# Patient Record
Sex: Female | Born: 1945 | Race: White | Hispanic: No | Smoking: Current every day smoker
Health system: Southern US, Community
[De-identification: ages and names within clinical notes are randomized; demographics above are authoritative.]

## PROBLEM LIST (undated history)

## (undated) DIAGNOSIS — M1990 Unspecified osteoarthritis, unspecified site: Secondary | ICD-10-CM

## (undated) DIAGNOSIS — M8580 Other specified disorders of bone density and structure, unspecified site: Secondary | ICD-10-CM

## (undated) DIAGNOSIS — B370 Candidal stomatitis: Secondary | ICD-10-CM

## (undated) DIAGNOSIS — F411 Generalized anxiety disorder: Secondary | ICD-10-CM

## (undated) DIAGNOSIS — D126 Benign neoplasm of colon, unspecified: Secondary | ICD-10-CM

## (undated) DIAGNOSIS — I10 Essential (primary) hypertension: Secondary | ICD-10-CM

## (undated) DIAGNOSIS — I4891 Unspecified atrial fibrillation: Secondary | ICD-10-CM

## (undated) DIAGNOSIS — K56609 Unspecified intestinal obstruction, unspecified as to partial versus complete obstruction: Secondary | ICD-10-CM

## (undated) DIAGNOSIS — J42 Unspecified chronic bronchitis: Secondary | ICD-10-CM

## (undated) DIAGNOSIS — I4892 Unspecified atrial flutter: Secondary | ICD-10-CM

## (undated) DIAGNOSIS — K760 Fatty (change of) liver, not elsewhere classified: Secondary | ICD-10-CM

## (undated) DIAGNOSIS — J309 Allergic rhinitis, unspecified: Secondary | ICD-10-CM

## (undated) DIAGNOSIS — K5790 Diverticulosis of intestine, part unspecified, without perforation or abscess without bleeding: Secondary | ICD-10-CM

## (undated) DIAGNOSIS — J939 Pneumothorax, unspecified: Secondary | ICD-10-CM

## (undated) DIAGNOSIS — Z720 Tobacco use: Secondary | ICD-10-CM

## (undated) HISTORY — DX: Unspecified atrial fibrillation: I48.91

## (undated) HISTORY — DX: Diverticulosis of intestine, part unspecified, without perforation or abscess without bleeding: K57.90

## (undated) HISTORY — DX: Unspecified osteoarthritis, unspecified site: M19.90

## (undated) HISTORY — DX: Other specified disorders of bone density and structure, unspecified site: M85.80

## (undated) HISTORY — DX: Benign neoplasm of colon, unspecified: D12.6

## (undated) HISTORY — DX: Allergic rhinitis, unspecified: J30.9

## (undated) HISTORY — DX: Candidal stomatitis: B37.0

## (undated) HISTORY — DX: Tobacco use: Z72.0

## (undated) HISTORY — DX: Essential (primary) hypertension: I10

## (undated) HISTORY — DX: Unspecified atrial flutter: I48.92

## (undated) HISTORY — DX: Unspecified intestinal obstruction, unspecified as to partial versus complete obstruction: K56.609

## (undated) HISTORY — DX: Generalized anxiety disorder: F41.1

## (undated) HISTORY — DX: Fatty (change of) liver, not elsewhere classified: K76.0

## (undated) HISTORY — PX: ABDOMINAL HYSTERECTOMY: SHX81

## (undated) HISTORY — DX: Pneumothorax, unspecified: J93.9

---

## 1965-02-01 HISTORY — PX: LUNG REMOVAL, PARTIAL: SHX233

## 1976-03-04 HISTORY — PX: APPENDECTOMY: SHX54

## 1982-03-04 HISTORY — PX: TOTAL ABDOMINAL HYSTERECTOMY W/ BILATERAL SALPINGOOPHORECTOMY: SHX83

## 1994-03-04 DIAGNOSIS — J939 Pneumothorax, unspecified: Secondary | ICD-10-CM

## 1994-03-04 HISTORY — DX: Pneumothorax, unspecified: J93.9

## 1998-02-21 ENCOUNTER — Other Ambulatory Visit: Admission: RE | Admit: 1998-02-21 | Discharge: 1998-02-21 | Payer: Self-pay | Admitting: Obstetrics and Gynecology

## 1999-02-22 ENCOUNTER — Other Ambulatory Visit: Admission: RE | Admit: 1999-02-22 | Discharge: 1999-02-22 | Payer: Self-pay | Admitting: Obstetrics and Gynecology

## 2000-03-21 ENCOUNTER — Other Ambulatory Visit: Admission: RE | Admit: 2000-03-21 | Discharge: 2000-03-21 | Payer: Self-pay | Admitting: Obstetrics and Gynecology

## 2001-03-24 ENCOUNTER — Other Ambulatory Visit: Admission: RE | Admit: 2001-03-24 | Discharge: 2001-03-24 | Payer: Self-pay | Admitting: Obstetrics and Gynecology

## 2001-05-10 ENCOUNTER — Emergency Department (HOSPITAL_COMMUNITY): Admission: EM | Admit: 2001-05-10 | Discharge: 2001-05-10 | Payer: Self-pay | Admitting: Emergency Medicine

## 2001-05-10 ENCOUNTER — Encounter: Payer: Self-pay | Admitting: Emergency Medicine

## 2001-10-01 ENCOUNTER — Encounter: Payer: Self-pay | Admitting: Internal Medicine

## 2001-10-01 ENCOUNTER — Ambulatory Visit (HOSPITAL_COMMUNITY): Admission: RE | Admit: 2001-10-01 | Discharge: 2001-10-01 | Payer: Self-pay | Admitting: Internal Medicine

## 2002-03-25 ENCOUNTER — Other Ambulatory Visit: Admission: RE | Admit: 2002-03-25 | Discharge: 2002-03-25 | Payer: Self-pay | Admitting: Obstetrics and Gynecology

## 2003-03-30 ENCOUNTER — Other Ambulatory Visit: Admission: RE | Admit: 2003-03-30 | Discharge: 2003-03-30 | Payer: Self-pay | Admitting: Obstetrics and Gynecology

## 2003-05-12 ENCOUNTER — Ambulatory Visit (HOSPITAL_COMMUNITY): Admission: RE | Admit: 2003-05-12 | Discharge: 2003-05-12 | Payer: Self-pay | Admitting: Gastroenterology

## 2004-03-29 ENCOUNTER — Other Ambulatory Visit: Admission: RE | Admit: 2004-03-29 | Discharge: 2004-03-29 | Payer: Self-pay | Admitting: Obstetrics and Gynecology

## 2004-10-18 ENCOUNTER — Emergency Department (HOSPITAL_COMMUNITY): Admission: EM | Admit: 2004-10-18 | Discharge: 2004-10-18 | Payer: Self-pay | Admitting: *Deleted

## 2004-10-18 ENCOUNTER — Encounter: Admission: RE | Admit: 2004-10-18 | Discharge: 2004-10-18 | Payer: Self-pay | Admitting: Internal Medicine

## 2004-10-19 ENCOUNTER — Inpatient Hospital Stay (HOSPITAL_COMMUNITY): Admission: EM | Admit: 2004-10-19 | Discharge: 2004-10-30 | Payer: Self-pay | Admitting: Emergency Medicine

## 2004-10-22 ENCOUNTER — Encounter: Payer: Self-pay | Admitting: Cardiology

## 2004-10-23 ENCOUNTER — Ambulatory Visit: Payer: Self-pay | Admitting: Cardiology

## 2004-10-31 ENCOUNTER — Ambulatory Visit: Payer: Self-pay | Admitting: *Deleted

## 2004-10-31 ENCOUNTER — Ambulatory Visit: Payer: Self-pay

## 2004-11-06 ENCOUNTER — Ambulatory Visit: Payer: Self-pay | Admitting: Critical Care Medicine

## 2004-11-07 ENCOUNTER — Ambulatory Visit: Payer: Self-pay | Admitting: Critical Care Medicine

## 2004-11-08 ENCOUNTER — Ambulatory Visit: Payer: Self-pay | Admitting: Internal Medicine

## 2004-11-09 ENCOUNTER — Ambulatory Visit: Payer: Self-pay | Admitting: Cardiology

## 2004-11-14 ENCOUNTER — Ambulatory Visit: Payer: Self-pay | Admitting: Cardiology

## 2004-11-16 ENCOUNTER — Ambulatory Visit: Payer: Self-pay | Admitting: Cardiology

## 2004-11-21 ENCOUNTER — Ambulatory Visit: Payer: Self-pay | Admitting: Cardiology

## 2004-12-04 ENCOUNTER — Ambulatory Visit: Payer: Self-pay | Admitting: *Deleted

## 2004-12-10 ENCOUNTER — Ambulatory Visit: Payer: Self-pay | Admitting: Critical Care Medicine

## 2004-12-19 ENCOUNTER — Ambulatory Visit: Payer: Self-pay | Admitting: Cardiology

## 2005-01-09 ENCOUNTER — Ambulatory Visit: Payer: Self-pay | Admitting: Cardiology

## 2005-01-14 ENCOUNTER — Ambulatory Visit: Payer: Self-pay | Admitting: Internal Medicine

## 2005-01-23 ENCOUNTER — Ambulatory Visit: Payer: Self-pay | Admitting: Cardiology

## 2005-02-06 ENCOUNTER — Ambulatory Visit: Payer: Self-pay | Admitting: *Deleted

## 2005-02-11 ENCOUNTER — Ambulatory Visit: Payer: Self-pay | Admitting: Cardiology

## 2005-02-20 ENCOUNTER — Ambulatory Visit: Payer: Self-pay | Admitting: Internal Medicine

## 2005-03-20 ENCOUNTER — Encounter: Admission: RE | Admit: 2005-03-20 | Discharge: 2005-03-20 | Payer: Self-pay | Admitting: Internal Medicine

## 2005-04-01 ENCOUNTER — Other Ambulatory Visit: Admission: RE | Admit: 2005-04-01 | Discharge: 2005-04-01 | Payer: Self-pay | Admitting: Obstetrics and Gynecology

## 2005-04-15 ENCOUNTER — Ambulatory Visit: Payer: Self-pay | Admitting: Pulmonary Disease

## 2005-04-25 ENCOUNTER — Ambulatory Visit: Payer: Self-pay | Admitting: Pulmonary Disease

## 2005-05-02 ENCOUNTER — Ambulatory Visit: Payer: Self-pay | Admitting: Critical Care Medicine

## 2005-07-17 ENCOUNTER — Ambulatory Visit: Payer: Self-pay | Admitting: Internal Medicine

## 2005-08-08 ENCOUNTER — Ambulatory Visit: Payer: Self-pay | Admitting: Cardiology

## 2005-08-22 ENCOUNTER — Ambulatory Visit: Payer: Self-pay | Admitting: Critical Care Medicine

## 2005-12-30 ENCOUNTER — Ambulatory Visit: Payer: Self-pay | Admitting: Critical Care Medicine

## 2006-04-09 ENCOUNTER — Other Ambulatory Visit: Admission: RE | Admit: 2006-04-09 | Discharge: 2006-04-09 | Payer: Self-pay | Admitting: Obstetrics and Gynecology

## 2006-04-16 ENCOUNTER — Ambulatory Visit: Payer: Self-pay | Admitting: Critical Care Medicine

## 2007-01-15 DIAGNOSIS — J4540 Moderate persistent asthma, uncomplicated: Secondary | ICD-10-CM | POA: Insufficient documentation

## 2007-02-04 ENCOUNTER — Encounter: Payer: Self-pay | Admitting: Critical Care Medicine

## 2007-05-14 ENCOUNTER — Encounter: Payer: Self-pay | Admitting: Critical Care Medicine

## 2007-06-09 ENCOUNTER — Other Ambulatory Visit: Admission: RE | Admit: 2007-06-09 | Discharge: 2007-06-09 | Payer: Self-pay | Admitting: Obstetrics and Gynecology

## 2008-03-02 ENCOUNTER — Inpatient Hospital Stay (HOSPITAL_COMMUNITY): Admission: EM | Admit: 2008-03-02 | Discharge: 2008-03-04 | Payer: Self-pay | Admitting: Emergency Medicine

## 2008-03-02 ENCOUNTER — Ambulatory Visit: Payer: Self-pay | Admitting: Pulmonary Disease

## 2008-03-03 ENCOUNTER — Encounter: Payer: Self-pay | Admitting: Critical Care Medicine

## 2008-03-10 ENCOUNTER — Ambulatory Visit: Payer: Self-pay | Admitting: Critical Care Medicine

## 2008-04-06 ENCOUNTER — Ambulatory Visit: Payer: Self-pay | Admitting: Critical Care Medicine

## 2008-05-03 ENCOUNTER — Telehealth (INDEPENDENT_AMBULATORY_CARE_PROVIDER_SITE_OTHER): Payer: Self-pay | Admitting: *Deleted

## 2008-06-09 ENCOUNTER — Ambulatory Visit: Payer: Self-pay | Admitting: Obstetrics and Gynecology

## 2008-06-09 ENCOUNTER — Other Ambulatory Visit: Admission: RE | Admit: 2008-06-09 | Discharge: 2008-06-09 | Payer: Self-pay | Admitting: Obstetrics and Gynecology

## 2008-06-09 ENCOUNTER — Encounter: Payer: Self-pay | Admitting: Obstetrics and Gynecology

## 2008-08-08 ENCOUNTER — Ambulatory Visit: Payer: Self-pay | Admitting: Cardiology

## 2008-08-08 ENCOUNTER — Encounter: Payer: Self-pay | Admitting: Nurse Practitioner

## 2008-08-08 DIAGNOSIS — J309 Allergic rhinitis, unspecified: Secondary | ICD-10-CM | POA: Insufficient documentation

## 2008-08-08 DIAGNOSIS — F411 Generalized anxiety disorder: Secondary | ICD-10-CM | POA: Insufficient documentation

## 2008-08-09 LAB — CONVERTED CEMR LAB
BUN: 15 mg/dL (ref 6–23)
CO2: 27 meq/L (ref 19–32)
Calcium: 9.1 mg/dL (ref 8.4–10.5)
Chloride: 112 meq/L (ref 96–112)
Creatinine, Ser: 0.7 mg/dL (ref 0.4–1.2)
GFR calc non Af Amer: 89.3 mL/min (ref >60)
Glucose, Bld: 82 mg/dL (ref 70–99)
Magnesium: 2.2 mg/dL (ref 1.5–2.5)
Potassium: 3.6 meq/L (ref 3.5–5.1)
Sodium: 143 meq/L (ref 135–145)
TSH: 1.21 microintl units/mL (ref 0.35–5.50)

## 2008-08-10 ENCOUNTER — Ambulatory Visit: Payer: Self-pay | Admitting: Cardiology

## 2008-08-15 ENCOUNTER — Encounter: Payer: Self-pay | Admitting: Cardiology

## 2008-08-15 ENCOUNTER — Ambulatory Visit: Payer: Self-pay

## 2008-09-06 ENCOUNTER — Encounter (INDEPENDENT_AMBULATORY_CARE_PROVIDER_SITE_OTHER): Payer: Self-pay | Admitting: *Deleted

## 2008-10-06 ENCOUNTER — Telehealth: Payer: Self-pay | Admitting: Cardiology

## 2008-10-18 ENCOUNTER — Ambulatory Visit: Payer: Self-pay | Admitting: Cardiology

## 2008-11-26 ENCOUNTER — Encounter: Payer: Self-pay | Admitting: Critical Care Medicine

## 2008-11-28 ENCOUNTER — Encounter: Payer: Self-pay | Admitting: Critical Care Medicine

## 2009-02-08 ENCOUNTER — Encounter (INDEPENDENT_AMBULATORY_CARE_PROVIDER_SITE_OTHER): Payer: Self-pay | Admitting: *Deleted

## 2009-04-13 ENCOUNTER — Ambulatory Visit: Payer: Self-pay | Admitting: Cardiology

## 2009-04-26 ENCOUNTER — Telehealth: Payer: Self-pay | Admitting: Critical Care Medicine

## 2009-05-02 ENCOUNTER — Ambulatory Visit: Payer: Self-pay | Admitting: Critical Care Medicine

## 2009-06-12 ENCOUNTER — Ambulatory Visit: Payer: Self-pay | Admitting: Obstetrics and Gynecology

## 2009-06-12 ENCOUNTER — Other Ambulatory Visit: Admission: RE | Admit: 2009-06-12 | Discharge: 2009-06-12 | Payer: Self-pay | Admitting: Obstetrics and Gynecology

## 2009-06-29 ENCOUNTER — Ambulatory Visit: Payer: Self-pay | Admitting: Obstetrics and Gynecology

## 2009-08-09 ENCOUNTER — Telehealth: Payer: Self-pay | Admitting: Critical Care Medicine

## 2009-10-06 ENCOUNTER — Ambulatory Visit: Payer: Self-pay | Admitting: Critical Care Medicine

## 2009-11-27 ENCOUNTER — Ambulatory Visit: Payer: Self-pay | Admitting: Critical Care Medicine

## 2009-12-25 ENCOUNTER — Ambulatory Visit: Payer: Self-pay | Admitting: Critical Care Medicine

## 2010-02-14 ENCOUNTER — Ambulatory Visit: Payer: Self-pay | Admitting: Critical Care Medicine

## 2010-02-16 ENCOUNTER — Telehealth: Payer: Self-pay | Admitting: Critical Care Medicine

## 2010-02-19 ENCOUNTER — Telehealth (INDEPENDENT_AMBULATORY_CARE_PROVIDER_SITE_OTHER): Payer: Self-pay | Admitting: *Deleted

## 2010-04-03 NOTE — Letter (Signed)
Summary: Appointment - Reminder 2  [employer], Main Office  [address]   [place], [place] [address]   Phone: [phone]  Fax: [phone]     February 08, 2009 MRN: [id number]   Anders Grant [address] [place], [place]  [address]   Dear Ms. GARRISON,  Our records indicate that it is time to schedule a follow-up appointment. Dr.WALL recommended that you follow up with [place] in FEB,2011. It is very important that we reach you to schedule this appointment. We look forward to participating in your health care needs. Please contact us at the number listed above at your earliest convenience to schedule your appointment.  If you are unable to make an appointment at this time, give [place] a call so we can update our records.     Sincerely, [facility] Scheduling Team

## 2010-04-03 NOTE — Assessment & Plan Note (Signed)
Summary: ROV/KFW/ EVENT STRIPS IN BLUE FOLDER/ANAS   Visit Type:  rov Primary Provider:  Dr. Elmore Guise  CC:  .pt c/o bilateral leg pain says then she notices that feet/ankles have edema.  History of Present Illness: Elizabeth Mcconnell returns today for management of her paroxysmal atrial fibrillation. Please see the extensive note by our nurse practitioner Thayer Ohm.  Her TSH, magnesium potassium were normal. An event recorder showed several episodes of atrial fibrillation with rapid ventricular rate. Last date recorded was 08/20/2008.  Since increasing her diltiazem or Cardizem to 300 mg, she is remarkably improved. She is also now off of her prednisone and antibiotics for asthma and COPD flares. She remains on aspirin 325 mg per day.  Her echocardiogram showed normal left ventricular function, no LVH, and no significant left atrial enlargement.  Current Medications (verified): 1)  Advair Diskus 250-50 Mcg/dose Misc (Fluticasone-Salmeterol) .... Use 1 Puff Two Times A Day 2)  Aspirin Ec 325 Mg Tbec (Aspirin) .... Take One Tablet By Mouth Daily 3)  Cardizem Cd 300 Mg Xr24h-Cap (Diltiazem Hcl Coated Beads) .... Take One Daily 4)  Singulair 10 Mg Tabs (Montelukast Sodium) .... Take 1 Tablet By Mouth Once A Day 5)  Xopenex Hfa 45 Mcg/act Aero (Levalbuterol Tartrate) .... Inhale 1-2 Puff Using Inhaler Every Four Hours As Needed 6)  Estradiol 1 Mg Tabs (Estradiol) .[name].. 1 By Mouth Once Daily 7)  Nasonex 50 Mcg/act Susp (Mometasone Furoate) .[name].. 1 By Mouth Once Daily 8)  Calcium-Vitamin D 500-200 Mg-Unit Tabs (Calcium Carbonate-Vitamin D) .[name].. 1 Tab Once Daily  Allergies (verified): 1)  ! 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    Past Surgical History: Last updated: 08/26/2008 lung surgery 02/1965 Hysterectomy C-section x2  Family History: Last updated: 03/10/2008 no cancer, DM or heart dz.  asthma hx.   Social History: Last updated: 03/10/2008 former smoker Widow- husband passed 12/09 2 sons.  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   Risk Factors: Smoking Status: quit (01/15/2007)  Review of Systems       negative other than the history of present illness  Vital Signs:  Patient profile:   65 year old female Height:      65 inches Weight:      158 pounds BMI:     26.39 Pulse rate:   76 / minute Pulse rhythm:   regular BP sitting:   138 / 70  (left arm) Cuff size:   large  Vitals Entered By: Danielle Rankin, CMA (October 18, 2008 3:11 PM)  Physical Exam  General:  Well developed, well nourished, in no acute distress. Head:  normocephalic and atraumatic Neck:  Neck supple, no JVD. No masses, thyromegaly or abnormal cervical nodes. Lungs:  Clear bilaterally to auscultation and percussion. Heart:  Non-displaced PMI, chest non-tender; regular rate and rhythm, S1, S2 without murmurs, rubs or gallops. Carotid upstroke normal, no bruit. Normal abdominal aortic size, no bruits. Femorals normal pulses, no bruits. Pedals normal pulses. No edema, no varicosities. Msk:  Back normal, normal gait. Muscle strength and tone normal. Pulses:  pulses normal in all 4 extremities Extremities:  trace left pedal edema and trace right pedal edema.   Neurologic:  Alert and oriented x 3. Skin:  Intact without lesions or rashes. Psych:  Normal affect.   Impression & Recommendations:  Problem #  1:  ATRIAL FIBRILLATION (ICD-427.31) Assessment Improved  Her updated medication list for this problem includes:    Aspirin Ec 325 Mg Tbec (Aspirin) .[name]... Take one tablet by mouth daily Continue diltiazem at the current dose.

## 2010-04-03 NOTE — Assessment & Plan Note (Signed)
Summary: Pulmonary OV   Primary Provider/Referring Provider:  Dr. Elmore Guise  CC:  6 month follow up.  pt states breathing is doing well overall.  Denies SOB, wheezing, chest tightness, cough.  Requesting singular rx, and xopenex rx and generic sub for nasonex-[facility].Elizabeth Mcconnell  History of Present Illness:  65 year old white female with a history of moderate persistent asthma with significant atopic features, very high positive IgE level.   May 02, 2009 2:27 PM Annual f/u.  No major problems over the past year  Pt denies any significant sore throat, nasal congestion or excess secretions, fever, chills, sweats, unintended weight loss, pleurtic or exertional chest pain, orthopnea PND, or leg swelling Pt denies any increase in rescue therapy over baseline, denies waking up needing it or having any early am or nocturnal exacerbations of coughing/wheezing/or dyspnea.  October 06, 2009 9:58 AM no real change since march.  rx amoxicillin after zpak per pcp green in 6/11.  Still febrile after zpak and better after amoxicillin.  Had sinus and throat issues and prod cough.  Now is better.   Felt bad all over  for several days.  Ok since.   Now no cough,  no wheeze  Asthma History    Asthma Control Assessment:    Age range: 12+ years    Symptoms: 0-2 days/week    Nighttime Awakenings: 0-2/month    Interferes w/ normal activity: no limitations    SABA use (not for EIB): 0-2 days/week    ATAQ questionnaire: 0    Exacerbations requiring oral systemic steroids: 0-1/year    Asthma Control Assessment: Well Controlled   Preventive Screening-Counseling & Management  Alcohol-Tobacco     Smoking Status: quit > 6 months     Year Quit: 2006     Pack years: 2 packs per week for 25 years  Current Medications (verified): 1)  Advair Diskus 250-50 Mcg/dose Misc (Fluticasone-Salmeterol) .... Use 1 Puff Two Times A Day 2)  Aspirin 81 Mg Tbec (Aspirin) .... Take One Tablet By Mouth Daily 3)  Cardizem Cd  300 Mg Xr24h-Cap (Diltiazem Hcl Coated Beads) .... Take One Daily 4)  Singulair 10 Mg Tabs (Montelukast Sodium) .... Take 1 Tablet By Mouth Once A Day 5)  Xopenex Hfa 45 Mcg/act Aero (Levalbuterol Tartrate) .... Inhale 1-2 Puff Using Inhaler Every Four Hours As Needed 6)  Estradiol 1 Mg Tabs (Estradiol) .Elizabeth Mcconnell.. 1 By Mouth Once Daily 7)  Nasonex 50 Mcg/act Susp (Mometasone Furoate) .Elizabeth Mcconnell.. 1 By Mouth Once Daily 8)  Calcium-Vitamin D 500-200 Mg-Unit Tabs (Calcium Carbonate-Vitamin D) .Elizabeth Mcconnell.. 1 Tab Once Daily  Allergies (verified): 1)  ! * Avelox  Past History:  Past medical, surgical, family and social histories (including risk factors) reviewed, and no changes noted (except as noted below).  Past Medical History: Reviewed history from 08/08/2008 and no changes required. 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   Past Surgical History: Reviewed history from 08/26/2008 and no changes required. lung surgery 02/1965 Hysterectomy C-section x2  Family History: Reviewed history from 03/10/2008 and no changes required. no cancer, DM or heart dz.  asthma hx.   Social History: Reviewed history from 03/10/2008 and no changes required. former smoker Widow- husband passed 12/09 2 sons.  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   Review of Systems  The patient denies shortness of breath with activity, shortness of breath at rest, productive  cough, non-productive cough, coughing up blood, chest pain, irregular heartbeats, acid heartburn, indigestion, loss of appetite, weight change, abdominal pain, difficulty swallowing, sore throat, tooth/dental problems, headaches, nasal congestion/difficulty breathing through nose, sneezing, itching, ear ache, anxiety, depression, hand/feet swelling, joint stiffness or pain,  rash, change in color of mucus, and fever.    Vital Signs:  Patient profile:   65 year old female Height:      65 inches Weight:      135.25 pounds BMI:     22.59 O2 Sat:      99 % on Room air Temp:     97.9 degrees F oral Pulse rate:   71 / minute BP sitting:   116 / 86  (left arm) Cuff size:   regular  Vitals Entered By: [name] RN (October 06, 2009 9:48 AM)  O2 Flow:  Room air CC: 6 month follow up.  pt states breathing is doing well overall.  Denies SOB, wheezing, chest tightness, cough.  Requesting singular rx, xopenex rx and generic sub for nasonex-[facility]. Comments Medications reviewed with patient Daytime contact number verified with patient. [name] RN  October 06, 2009 9:47 AM    Physical Exam  Additional Exam:  GEN: A/Ox3; pleasant , NAD HEENT:  [place]/AT, , EACs-clear, TMs-wnl, NOSE-pale, with clear discharge, THROAT-clear NECK:  Supple w/ fair ROM; no JVD; normal carotid impulses w/o bruits; no thyromegaly or nodules palpated; no lymphadenopathy. CHEST:  Coarse BS , distant BS HEART:  RRR, no m/r/g   ABDOMEN:  Soft & nt; nml bowel sounds; no organomegaly or masses detected. EXT: Warm bil,  no calf pain, edema, clubbing, pulses intact     Impression & Recommendations:  Problem # 1:  ASTHMA (ICD-493.90) Assessment Unchanged  chronic asthmatic bronchitis now improved plan No change in inhaled medications.   Maintain treatment program as currently prescribed.  Medications Added to Medication List This Visit: 1)  Singulair 10 Mg Tabs (Montelukast sodium) .... Take 1 tablet by mouth once a day 2)  Xopenex Hfa 45 Mcg/act Aero (Levalbuterol tartrate) .... Inhale 1-2 puff using inhaler every four hours as needed put on hold, pt does not yet need 3)  Fluticasone Propionate 50 Mcg/act Susp (Fluticasone propionate) .... Two sprays each nostril daily  Complete Medication List: 1)  Advair Diskus 250-50 Mcg/dose Misc (Fluticasone-salmeterol) .... Use  1 puff two times a day 2)  Aspirin 81 Mg Tbec (Aspirin) .... Take one tablet by mouth daily 3)  Cardizem Cd 300 Mg Xr24h-cap (Diltiazem hcl coated beads) .... Take one daily 4)  Singulair 10 Mg Tabs (Montelukast sodium) .... Take 1 tablet by mouth once a day 5)  Xopenex Hfa 45 Mcg/act Aero (Levalbuterol tartrate) .... Inhale 1-2 puff using inhaler every four hours as needed put on hold, pt does not yet need 6)  Estradiol 1 Mg Tabs (Estradiol) .Elizabeth Mcconnell.. 1 by mouth once daily 7)  Fluticasone Propionate 50 Mcg/act Susp (Fluticasone propionate) .... Two sprays each nostril daily 8)  Calcium-vitamin D 500-200 Mg-unit Tabs (Calcium carbonate-vitamin d) .Elizabeth Mcconnell.. 1 tab once daily  Other Orders: Est. Patient Level III ([address])  Patient Instructions: 1)  No change in medications 2)  Return in     4-5      months Prescriptions: XOPENEX HFA 45 MCG/ACT AERO (LEVALBUTEROL TARTRATE) Inhale 1-2 puff using inhaler every four hours as needed put on hold, pt does not yet need Brand medically necessary #1 x 6   Entered and Authorized by:  Storm Frisk MD   Signed by:   Storm Frisk MD on 10/06/2009   Method used:   Electronically to        [facility] (retail)       [address]       [place], [place]  [address]       Ph: [phone] or [phone]       Fax: [phone]   RxID:   [id number] SINGULAIR 10 MG TABS (MONTELUKAST SODIUM) Take 1 tablet by mouth once a day  #30 x 6   Entered and Authorized by:   Storm Frisk MD   Signed by:   Storm Frisk MD on 10/06/2009   Method used:   Electronically to        [facility] (retail)       [address]       [place], [place]  [address]       Ph: [phone] or [phone]       Fax: [phone]   RxID:   [id number] FLUTICASONE PROPIONATE 50 MCG/ACT SUSP (FLUTICASONE PROPIONATE) two sprays each nostril daily  #1 x 6   Entered and Authorized by:   Storm Frisk MD    Signed by:   Storm Frisk MD on 10/06/2009   Method used:   Electronically to        [facility] (retail)       [address]       [place], [place]  [address]       Ph: [phone] or [phone]       Fax: [phone]   RxID:   [id number]     Appended Document: Pulmonary OV fax ed green

## 2010-04-03 NOTE — Assessment & Plan Note (Signed)
Summary: Pulmonary OV   Primary Provider/Referring Provider:  Dr. Elmore Guise  CC:  Acute Visit.  drainage, chest congestion, and prod cough with white to brown mucus x 1 wk.  Chest tightness and wheezing x 4 days.  Finished amoxicillicin x 5 days ago.  [name]  History of Present Illness:  65 year old white female with a history of moderate persistent asthma with significant atopic features, very high positive IgE level.   May 02, 2009 2:27 PM Annual f/u.  No major problems over the past year  Pt denies any significant sore throat, nasal congestion or excess secretions, fever, chills, sweats, unintended weight loss, pleurtic or exertional chest pain, orthopnea PND, or leg swelling Pt denies any increase in rescue therapy over baseline, denies waking up needing it or having any early am or nocturnal exacerbations of coughing/wheezing/or dyspnea.  October 06, 2009 9:58 AM no real change since march.  rx amoxicillin after zpak per pcp green in 6/11.  Still febrile after zpak and better after amoxicillin.  Had sinus and throat issues and prod cough.  Now is better.   Felt bad all over  for several days.  Ok since.   Now no cough,  no wheeze  November 27, 2009 3:53 PM Ok until one week ago with sinus drainage and then coughing.   Was on amoxicil.  got worse over the past three days.  No making progress.  Pt is tight in chest and dyspneic.  Feels lethargic Mucus is white to brown mucus.    Asthma History    Asthma Control Assessment:    Age range: 12+ years    Symptoms: throughout the day    Nighttime Awakenings: 0-2/month    Interferes w/ normal activity: some limitations    SABA use (not for EIB): several times per day    ATAQ questionnaire: 1-2    Exacerbations requiring oral systemic steroids: 0-1/year    Asthma Control Assessment: Very Poorly Controlled   Preventive Screening-Counseling & Management  Alcohol-Tobacco     Smoking Status: quit > 6 months     Year Quit: 2006     Pack  years: 2 packs per week for 25 years  Current Medications (verified): 1)  Advair Diskus 250-50 Mcg/dose Misc (Fluticasone-Salmeterol) .... Use 1 Puff Two Times A Day 2)  Aspirin 81 Mg Tbec (Aspirin) .... Take One Tablet By Mouth Daily 3)  Cardizem Cd 300 Mg Xr24h-Cap (Diltiazem Hcl Coated Beads) .... Take One Daily 4)  Singulair 10 Mg Tabs (Montelukast Sodium) .... Take 1 Tablet By Mouth Once A Day 5)  Xopenex Hfa 45 Mcg/act Aero (Levalbuterol Tartrate) .... Inhale 1-2 Puff Using Inhaler Every Four Hours As Needed Put On Hold, Pt Does Not Yet Need 6)  Estradiol 1 Mg Tabs (Estradiol) .[name].. 1 By Mouth Once Daily 7)  Fluticasone Propionate 50 Mcg/act Susp (Fluticasone Propionate) .... Two Sprays Each Nostril Daily 8)  Calcium-Vitamin D 500-200 Mg-Unit Tabs (Calcium Carbonate-Vitamin D) .[name].. 1 Tab Once Daily  Allergies (verified): 1)  ! * Avelox  Past History:  Past medical, surgical, family and social histories (including risk factors) reviewed, and no changes noted (except as noted below).  Past Medical History: Reviewed history from 08/08/2008 and no changes required. 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   Past Surgical History: Reviewed history from 08/26/2008 and no changes required. lung surgery 02/1965 Hysterectomy C-section x2  Family History: Reviewed history from 03/10/2008 and no changes required. no cancer, DM or heart dz.  asthma hx.   Social History: Reviewed history from 03/10/2008 and no changes required. former smoker.  Quit in 2008.  < 1/2 ppd  x 20 yrs Widow- husband passed 12/09 2 sons.  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   Review of Systems       The patient complains of shortness of breath with activity, shortness of breath at rest, productive cough,  non-productive cough, nasal congestion/difficulty breathing through nose, and change in color of mucus.  The patient denies coughing up blood, chest pain, irregular heartbeats, acid heartburn, indigestion, loss of appetite, weight change, abdominal pain, difficulty swallowing, sore throat, tooth/dental problems, headaches, sneezing, itching, ear ache, anxiety, depression, hand/feet swelling, joint stiffness or pain, rash, and fever.    Vital Signs:  Patient profile:   65 year old female Height:      65 inches Weight:      131.38 pounds BMI:     21.94 O2 Sat:      95 % on Room air Temp:     98.3 degrees F oral Pulse rate:   73 / minute BP sitting:   130 / 86  (right arm) Cuff size:   regular  Vitals Entered By: [name] RN (November 27, 2009 3:30 PM)  O2 Flow:  Room air CC: Acute Visit.  drainage, chest congestion, prod cough with white to brown mucus x 1 wk.  Chest tightness and wheezing x 4 days.  Finished amoxicillicin x 5 days ago.   Comments Medications reviewed with patient Daytime contact number verified with patient. [name] RN  November 27, 2009 3:32 PM    Physical Exam  Additional Exam:  GEN: A/Ox3; pleasant , NAD HEENT:  [place]/AT, , EACs-clear, TMs-wnl, NOSE-pale, with purulent  discharge, THROAT-oral thrush NECK:  Supple w/ fair ROM; no JVD; normal carotid impulses w/o bruits; no thyromegaly or nodules palpated; no lymphadenopathy. CHEST:  Coarse BS , exp wheeze  HEART:  RRR, no m/r/g   ABDOMEN:  Soft & nt; nml bowel sounds; no organomegaly or masses detected. EXT: Warm bil,  no calf pain, edema, clubbing, pulses intact     Impression & Recommendations:  Problem # 1:  OTHER ACUTE SINUSITIS (ICD-461.8) Assessment Deteriorated acute sinusitis with asthma flare plan augmentin x 7days nasal hygiene pulse prednisone  cont inhaled meds  Her updated medication list for this problem includes:    Fluticasone Propionate 50 Mcg/act Susp (Fluticasone  propionate) .[name]..[name] Two sprays each nostril daily    Amoxicillin-pot Clavulanate 875-125 Mg Tabs (Amoxicillin-pot clavulanate) ..... One by mouth two times a day  Problem # 2:  CANDIDIASIS OF MOUTH (ICD-112.0) Assessment: Deteriorated oral thrush plan 5 days of diflucan  Medications Added to Medication List This Visit: 1)  Amoxicillin-pot Clavulanate 875-125 Mg Tabs (Amoxicillin-pot clavulanate) .... One by mouth two times a day 2)  Fluconazole 100 Mg Tabs (Fluconazole) .... Take two by mouth times one dose then one daily until gone 3)  Prednisone 10 Mg Tabs (Prednisone) .... Take as directed 4 each am x3days, 3 x 3days, 2 x 3days, 1 x 3days then stop  Complete Medication List: 1)  Advair Diskus 250-50 Mcg/dose Misc (Fluticasone-salmeterol) .... Use 1 puff two times a day 2)  Aspirin 81 Mg Tbec (Aspirin) .... Take one tablet by mouth daily 3)  Cardizem Cd 300 Mg Xr24h-cap (Diltiazem hcl coated beads) .... Take one daily 4)  Singulair 10  Mg Tabs (Montelukast sodium) .... Take 1 tablet by mouth once a day 5)  Estradiol 1 Mg Tabs (Estradiol) .[name].. 1 by mouth once daily 6)  Fluticasone Propionate 50 Mcg/act Susp (Fluticasone propionate) .... Two sprays each nostril daily 7)  Calcium-vitamin D 500-200 Mg-unit Tabs (Calcium carbonate-vitamin d) .[name].. 1 tab once daily 8)  Xopenex Hfa 45 Mcg/act Aero (Levalbuterol tartrate) .... Inhale 1-2 puff using inhaler every four hours as needed put on hold, pt does not yet need 9)  Amoxicillin-pot Clavulanate 875-125 Mg Tabs (Amoxicillin-pot clavulanate) .... One by mouth two times a day 10)  Fluconazole 100 Mg Tabs (Fluconazole) .... Take two by mouth times one dose then one daily until gone 11)  Prednisone 10 Mg Tabs (Prednisone) .... Take as directed 4 each am x3days, 3 x 3days, 2 x 3days, 1 x 3days then stop  Other Orders: Est. Patient Level IV ([address])  Patient Instructions: 1)  Prednisone 10mg  4 each am x3days, 3 x 3days, 2 x 3days, 1 x 3days then  stop 2)  Augmentin (generic) one twice a day for 7days 3)  Diflucan (generic ) two times one dose then one daily until gone 4)  Use the [employer]Med nasal rinse at least daily washing out both nares thoroughly.  Place one packet of Sinus Wash ingredients into the nasal wash bottle then fill to the dotted line with lukewarm tap water.  Lean over the sink and rinse each nostril out thoroughly and avoid letting the rinse go into the throat.   5)  No other medication changes 6)  Return 3 weeks Prescriptions: PREDNISONE 10 MG  TABS (PREDNISONE) Take as directed 4 each am x3days, 3 x 3days, 2 x 3days, 1 x 3days then stop  #30 x 0   Entered and Authorized by:   Storm Frisk MD   Signed by:   Storm Frisk MD on 11/27/2009   Method used:   Electronically to        [facility] (retail)       [address]       [place], [place]  [address]       Ph: [phone] or [phone]       Fax: [phone]   RxID:   [id number] FLUCONAZOLE 100 MG TABS (FLUCONAZOLE) take two by mouth times one dose then one daily until gone  #7 x 0   Entered and Authorized by:   Storm Frisk MD   Signed by:   Storm Frisk MD on 11/27/2009   Method used:   Electronically to        [facility] (retail)       [address]       [place], [place]  [address]       Ph: [phone] or [phone]       Fax: [phone]   RxID:   [id number] AMOXICILLIN-POT CLAVULANATE 875-125 MG TABS (AMOXICILLIN-POT CLAVULANATE) One by mouth two times a day  #14 x 0   Entered and Authorized by:   Storm Frisk MD   Signed by:   Storm Frisk MD on 11/27/2009   Method used:   Electronically to        [id number] (retail)       [address]       [place]  [place], [place]  [address]       Ph: [phone] or [phone]       Fax: [phone]   RxID:   [id number]     Appended Document: Pulmonary OV fax ed  green

## 2010-04-03 NOTE — Assessment & Plan Note (Signed)
Summary: Pulmonary OV   Primary Provider/Referring Provider:  Dr. Elmore Guise  CC:  3 wk follow up.  Pt states sxs have improved but still having a little PND and coughing.  Cough is mostly nonprod.  Hoarseness x 2 days.[name]  History of Present Illness:  65 year old white female with a history of moderate persistent asthma with significant atopic features, very high positive IgE level.   May 02, 2009 2:27 PM Annual f/u.  No major problems over the past year  Pt denies any significant sore throat, nasal congestion or excess secretions, fever, chills, sweats, unintended weight loss, pleurtic or exertional chest pain, orthopnea PND, or leg swelling Pt denies any increase in rescue therapy over baseline, denies waking up needing it or having any early am or nocturnal exacerbations of coughing/wheezing/or dyspnea.  October 06, 2009 9:58 AM no real change since march.  rx amoxicillin after zpak per pcp green in 6/11.  Still febrile after zpak and better after amoxicillin.  Had sinus and throat issues and prod cough.  Now is better.   Felt bad all over  for several days.  Ok since.   Now no cough,  no wheeze  November 27, 2009 3:53 PM Ok until one week ago with sinus drainage and then coughing.   Was on amoxicil.  got worse over the past three days.  No making progress.  Pt is tight in chest and dyspneic.  Feels lethargic Mucus is white to brown mucus.    December 25, 2009 4:28 PM took pred and augmentin and was hyper but is better.  Now some cough but is dry.  No dyspnea now.  No SABA use. Overall doing better. Pt denies any significant sore throat, nasal congestion or excess secretions, fever, chills, sweats, unintended weight loss, pleurtic or exertional chest pain, orthopnea PND, or leg swelling Pt denies any increase in rescue therapy over baseline, denies waking up needing it or having any early am or nocturnal exacerbations of coughing/wheezing/or dyspnea.   Asthma History    Asthma  Control Assessment:    Age range: 12+ years    Symptoms: 0-2 days/week    Nighttime Awakenings: 0-2/month    Interferes w/ normal activity: no limitations    SABA use (not for EIB): 0-2 days/week    ATAQ questionnaire: 0    Exacerbations requiring oral systemic steroids: 0-1/year    Asthma Control Assessment: Well Controlled   Current Medications (verified): 1)  Advair Diskus 250-50 Mcg/dose Misc (Fluticasone-Salmeterol) .... Use 1 Puff Two Times A Day 2)  Aspirin 81 Mg Tbec (Aspirin) .... Take One Tablet By Mouth Daily 3)  Cardizem Cd 300 Mg Xr24h-Cap (Diltiazem Hcl Coated Beads) .... Take One Daily 4)  Singulair 10 Mg Tabs (Montelukast Sodium) .... Take 1 Tablet By Mouth Once A Day 5)  Estradiol 1 Mg Tabs (Estradiol) .[name].. 1 By Mouth Once Daily 6)  Fluticasone Propionate 50 Mcg/act Susp (Fluticasone Propionate) .... Two Sprays Each Nostril Daily 7)  Calcium-Vitamin D 500-200 Mg-Unit Tabs (Calcium Carbonate-Vitamin D) .[name].. 1 Tab Once Daily 8)  Xopenex Hfa 45 Mcg/act Aero (Levalbuterol Tartrate) .... Inhale 1-2 Puff Using Inhaler Every Four Hours As Needed Put On Hold, Pt Does Not Yet Need  Allergies (verified): 1)  ! * Avelox  Past History:  Past medical, surgical, family and social histories (including risk factors) reviewed, and no changes noted (except as noted below).  Past Medical History: Reviewed history from 08/08/2008 and no changes required. 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   Past Surgical History: Reviewed history from 08/26/2008 and no changes required. lung surgery 02/1965 Hysterectomy C-section x2  Family History: Reviewed history from 03/10/2008 and no changes required. no cancer, DM or heart dz.  asthma hx.   Social History: Reviewed history from  11/27/2009 and no changes required. former smoker.  Quit in 2008.  < 1/2 ppd  x 20 yrs Widow- husband passed 12/09 2 sons.  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   Review of Systems       The patient complains of shortness of breath with activity.  The patient denies shortness of breath at rest, productive cough, non-productive cough, coughing up blood, chest pain, irregular heartbeats, acid heartburn, indigestion, loss of appetite, weight change, abdominal pain, difficulty swallowing, sore throat, tooth/dental problems, headaches, nasal congestion/difficulty breathing through nose, sneezing, itching, ear ache, anxiety, depression, hand/feet swelling, joint stiffness or pain, rash, change in color of mucus, and fever.    Vital Signs:  Patient profile:   65 year old female Height:      65 inches Weight:      132.50 pounds BMI:     22.13 O2 Sat:      97 % on Room air Temp:     97.9 degrees F oral Pulse rate:   75 / minute BP sitting:   110 / 72  (left arm) Cuff size:   regular  Vitals Entered By: [name] RN (December 25, 2009 4:18 PM)  O2 Flow:  Room air CC: 3 wk follow up.  Pt states sxs have improved but still having a little PND and coughing.  Cough is mostly nonprod.  Hoarseness x 2 days. Comments Medications reviewed with patient Daytime contact number verified with patient. [name] RN  December 25, 2009 4:19 PM    Physical Exam  Additional Exam:  GEN: A/Ox3; pleasant , NAD HEENT:  [address]/AT, , EACs-clear, TMs-wnl, NOSE-pale, with no discharge, no purulence, THROAT-oral thrush NECK:  Supple w/ fair ROM; no JVD; normal carotid impulses w/o bruits; no thyromegaly or nodules palpated; no lymphadenopathy. CHEST:  clear , no wheeze HEART:  RRR, no m/r/g   ABDOMEN:  Soft & nt; nml bowel sounds; no organomegaly or masses detected. EXT: Warm bil,  no calf pain, edema, clubbing, pulses intact     Impression & Recommendations:  Problem # 1:  ASTHMA (ICD-493.90) Assessment  Improved moderate persistent asthma improved airflow with recent acute tracheobronchitis and sinusitus  plan no further prednisone/antibiotic No change in inhaled medications.   Maintain treatment program as currently prescribed.  Complete Medication List: 1)  Advair Diskus 250-50 Mcg/dose Misc (Fluticasone-salmeterol) .... Use 1 puff two times a day 2)  Aspirin 81 Mg Tbec (Aspirin) .... Take one tablet by mouth daily 3)  Cardizem Cd 300 Mg Xr24h-cap (Diltiazem hcl coated beads) .... Take one daily 4)  Singulair 10 Mg Tabs (Montelukast sodium) .... Take 1 tablet by mouth once a day 5)  Estradiol 1 Mg Tabs (Estradiol) .[name].. 1 by mouth once daily 6)  Fluticasone Propionate 50 Mcg/act Susp (Fluticasone propionate) .... Two sprays each nostril daily 7)  Calcium-vitamin D 500-200 Mg-unit Tabs (Calcium carbonate-vitamin d) .[name].. 1 tab once daily 8)  Xopenex Hfa 45 Mcg/act Aero (Levalbuterol tartrate) .... Inhale 1-2 puff using inhaler every four hours as needed put on hold, pt does not yet need  Other Orders: Est.  Patient Level III [phone])  Patient Instructions: 1)  Use simple saline or AYR nasal spray 2 times daily, 3 sprays each nostril 2)  No more antibiotic or prednisone 3)  Stay on advair and flonase 4)  Flu vaccine today 5)  Return 2 months , sooner if necessary     Prevention & Chronic Care Immunizations   Influenza vaccine: Historical  (12/02/2008)    Tetanus booster: Not documented    Pneumococcal vaccine: Historical  (12/03/2007)    H. zoster vaccine: Not documented  Colorectal Screening   Hemoccult: Not documented    Colonoscopy: Not documented  Other Screening   Pap smear: Not documented    Mammogram: Not documented    DXA bone density scan: Not documented   Smoking status: quit > 6 months  (11/27/2009)  Lipids   Total Cholesterol: Not documented   LDL: Not documented   LDL Direct: Not documented   HDL: Not documented   Triglycerides: Not  documented   Nursing Instructions: Give Flu vaccine today   Appended Document: Pulmonary OV fax ed green

## 2010-04-03 NOTE — Progress Notes (Signed)
Summary: Event Monitor Results  Phone Note Outgoing Call   Call placed by: Bernita Raisin, RN, BSN,  October 06, 2008 5:45 PM Call placed to: Patient Details for Reason: Event Monitor Results [facility] NOS appt Summary of Call: RN Left Message To Call Back. Bernita Raisin, RN, BSN  October 06, 2008 5:46 PM  RN s/w Pt made aware of event monitor results, NOS appt rescheduled for 10/18/08. Pt verbalizes understanding. Bernita Raisin, RN, BSN  October 10, 2008 3:39 PM    Follow-up for Phone Call        CALLING BACK TO GET TEST RESULTS [phone] Elizabeth Mcconnell  October 10, 2008 2:52 PM

## 2010-04-03 NOTE — Assessment & Plan Note (Signed)
Summary: Pulmonary OV   Primary Provider/Referring Provider:  Dr. Elmore Guise  CC:  Yearly follow up asthma.  states breathing is doing well overall.  c/o PND and dry cough from "time to time."  Requesting rxs for advair and singular-[facility] and Nasonex-[facility]..  History of Present Illness:  65 year old white female with a history of moderate persistent asthma with significant atopic features, very high positive IgE level.   May 02, 2009 2:27 PM Annual f/u.  No major problems over the past year  Pt denies any significant sore throat, nasal congestion or excess secretions, fever, chills, sweats, unintended weight loss, pleurtic or exertional chest pain, orthopnea PND, or leg swelling Pt denies any increase in rescue therapy over baseline, denies waking up needing it or having any early am or nocturnal exacerbations of coughing/wheezing/or dyspnea.   Asthma History    Initial Asthma Severity Rating:    Age range: 12+ years    Symptoms: 0-2 days/week    Nighttime Awakenings: 0-2/month    Interferes w/ normal activity: no limitations    SABA use (not for EIB): 0-2 days/week    Exacerbations requiring oral systemic steroids: 0-1/year    Asthma Severity Assessment: Intermittent    Preventive Screening-Counseling & Management  Alcohol-Tobacco     Smoking Status: quit > 6 months  Current Medications (verified): 1)  Advair Diskus 250-50 Mcg/dose Misc (Fluticasone-Salmeterol) .... Use 1 Puff Two Times A Day 2)  Aspirin 81 Mg Tbec (Aspirin) .... Take One Tablet By Mouth Daily 3)  Cardizem Cd 300 Mg Xr24h-Cap (Diltiazem Hcl Coated Beads) .... Take One Daily 4)  Singulair 10 Mg Tabs (Montelukast Sodium) .... Take 1 Tablet By Mouth Once A Day 5)  Xopenex Hfa 45 Mcg/act Aero (Levalbuterol Tartrate) .... Inhale 1-2 Puff Using Inhaler Every Four Hours As Needed 6)  Estradiol 1 Mg Tabs (Estradiol) .[name].. 1 By Mouth Once Daily 7)  Nasonex 50 Mcg/act Susp (Mometasone Furoate) .[name].. 1 By Mouth Once  Daily 8)  Calcium-Vitamin D 500-200 Mg-Unit Tabs (Calcium Carbonate-Vitamin D) .[name].. 1 Tab Once Daily  Allergies (verified): 1)  ! * Avelox  Past History:  Past medical, surgical, family and social histories (including risk factors) reviewed, and no changes noted (except as noted below).  Past Medical History: Reviewed history from 08/08/2008 and no changes required. 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   Past Surgical History: Reviewed history from 08/26/2008 and no changes required. lung surgery 02/1965 Hysterectomy C-section x2  Family History: Reviewed history from 03/10/2008 and no changes required. no cancer, DM or heart dz.  asthma hx.   Social History: Reviewed history from 03/10/2008 and no changes required. former smoker Widow- husband passed 12/09 2 sons.  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  Smoking Status:  quit > 6 months  Review of Systems       The patient complains of shortness of breath with activity.  The patient denies shortness of breath at rest, productive cough, non-productive cough, coughing up blood, chest pain, irregular heartbeats, acid heartburn, indigestion, loss of appetite, weight change, abdominal pain, difficulty swallowing, sore throat, tooth/dental problems, headaches, nasal congestion/difficulty breathing through nose, sneezing, itching, ear ache, anxiety, depression, hand/feet swelling, joint stiffness or pain, rash, change in color of mucus, and fever.    Vital Signs:  Patient profile:   65 year old female Height:      65 inches Weight:  142.50 pounds BMI:     23.80 O2 Sat:      97 % on Room air Temp:     98.1 degrees F oral Pulse rate:   73 / minute BP sitting:   120 / 72  (right arm) Cuff size:   regular  Vitals Entered By:  [name] RN (May 02, 2009 2:23 PM)  O2 Flow:  Room air CC: Yearly follow up asthma.  states breathing is doing well overall.  c/o PND and dry cough from "time to time."  Requesting rxs for advair and singular-[facility] and Nasone[facility]. Comments Medications reviewed with patient Daytime contact number verified with patient. [name] RN  May 02, 2009 2:23 PM    Physical Exam  Additional Exam:  GEN: A/Ox3; pleasant , NAD HEENT:  [place]/AT, , EACs-clear, TMs-wnl, NOSE-pale, with clear discharge, THROAT-clear NECK:  Supple w/ fair ROM; no JVD; normal carotid impulses w/o bruits; no thyromegaly or nodules palpated; no lymphadenopathy. CHEST:  Coarse BS , distant BS HEART:  RRR, no m/r/g   ABDOMEN:  Soft & nt; nml bowel sounds; no organomegaly or masses detected. EXT: Warm bil,  no calf pain, edema, clubbing, pulses intact     Impression & Recommendations:  Problem # 1:  ASTHMA (ICD-493.90) Assessment Improved chronic asthmatic bronchitis now improved plan No change in inhaled medications.   Maintain treatment program as currently prescribed.  Complete Medication List: 1)  Advair Diskus 250-50 Mcg/dose Misc (Fluticasone-salmeterol) .... Use 1 puff two times a day 2)  Aspirin 81 Mg Tbec (Aspirin) .... Take one tablet by mouth daily 3)  Cardizem Cd 300 Mg Xr24h-cap (Diltiazem hcl coated beads) .... Take one daily 4)  Singulair 10 Mg Tabs (Montelukast sodium) .... Take 1 tablet by mouth once a day 5)  Xopenex Hfa 45 Mcg/act Aero (Levalbuterol tartrate) .... Inhale 1-2 puff using inhaler every four hours as needed 6)  Estradiol 1 Mg Tabs (Estradiol) .[name].. 1 by mouth once daily 7)  Nasonex 50 Mcg/act Susp (Mometasone furoate) .[name].. 1 by mouth once daily 8)  Calcium-vitamin D 500-200 Mg-unit Tabs (Calcium carbonate-vitamin d) .[name].. 1 tab once daily  Other Orders: Est. Patient Level III (84696)  Patient Instructions: 1)  No change in medications 2)  Return 6  months Prescriptions: NASONEX 50 MCG/ACT SUSP (MOMETASONE FUROATE) 1 by mouth once daily  #1 x 6   Entered and Authorized by:   Storm Frisk MD   Signed by:   Storm Frisk MD on 05/02/2009   Method used:   Electronically to        [facility]* (retail)       [address]       [place], [place]  [address]       Ph: [phone] or [phone]       Fax: [phone]   RxID:   [id number] SINGULAIR 10 MG TABS (MONTELUKAST SODIUM) Take 1 tablet by mouth once a day  #90 x 4   Entered and Authorized by:   Storm Frisk MD   Signed by:   Storm Frisk MD on 05/02/2009   Method used:   Faxed to ...       [facility]* (mail-order)             ,          Ph: [phone]       Fax: [phone]   RxID:  [id number] ADVAIR DISKUS 250-50 MCG/DOSE MISC (FLUTICASONE-SALMETEROL) Use 1 puff two times a day  #3 x 4   Entered and Authorized by:   Storm Frisk MD   Signed by:   Storm Frisk MD on 05/02/2009   Method used:   Faxed to ...       [facility]* (mail-order)             ,          Ph: [phone]       Fax: [phone]   RxID:   [id number]    Immunization History:  Influenza Immunization History:    Influenza:  historical (12/02/2008)  Pneumovax Immunization History:    Pneumovax:  historical (12/03/2007)   Prevention & Chronic Care Immunizations   Influenza vaccine: Historical  (12/02/2008)    Tetanus booster: Not documented    Pneumococcal vaccine: Historical  (12/03/2007)    H. zoster vaccine: Not documented  Colorectal Screening   Hemoccult: Not documented    Colonoscopy: Not documented  Other Screening   Pap smear: Not documented    Mammogram: Not documented    DXA bone density scan: Not documented   Smoking status: quit > 6 months  (05/02/2009)  Lipids   Total Cholesterol: Not documented   LDL: Not documented   LDL Direct: Not documented   HDL: Not documented    Triglycerides: Not documented  Appended Document: Pulmonary OV fax Elmore Guise

## 2010-04-03 NOTE — Progress Notes (Signed)
Summary: medication  Phone Note Call from Patient   Caller: Patient Call For: Raphel Stickles Summary of Call: need advair prescript called to  [facility] Initial call taken by: Rickard Patience,  August 09, 2009 9:15 AM  Follow-up for Phone Call        rx was sent to [facility] at 05-2009 appt, but pt states she wants to switch to local pharmacy, so rx sent to [facility]. pt requests 30 day supply with refills. RX sent. pt aware.Carron Curie CMA  August 09, 2009 9:19 AM     Prescriptions: ADVAIR DISKUS 250-50 MCG/DOSE MISC (FLUTICASONE-SALMETEROL) Use 1 puff two times a day  #1 x prn   Entered by:   Carron Curie CMA   Authorized by:   Storm Frisk MD   Signed by:   Carron Curie CMA on 08/09/2009   Method used:   Electronically to        [facility] (retail)       [address]       [place], [place]  [address]       Ph: [phone] or [phone]       Fax: [phone]   RxID:   [id number]

## 2010-04-03 NOTE — Assessment & Plan Note (Signed)
Summary: Pulmonary OV   PCP:  Dr. Elmore Guise  Chief Complaint:  4 week asthma follow-up. Pt c/o hoarseness.[name]  History of Present Illness:  65 year old white female with a history of moderate persistent asthma with significant atopic features, very high positive IgE level.   March 10, 2008--returns for post hospitalization visit. Admitted 12/30-03/05/2007 for asthmatic exacerbation, wosrening dyspnea, cough, congestion, wheezing. Failed outpatient therapy, zpack, Symptoms worsened and required admission. . Tx with IV steroids, antibiotics, pulmonary toilet. Discharged on prednisone taper. Has few days left, currently on Prednisone 10mg  2 daily. . Finished Avelox yesterday. XRay showed chronic changes and CT chest was neg. for PE, shoed patchy ground glass densities in right lung- ?mild aveolitis. Since discharge, feeling better but not to baseline. Rare wheezing, still using xopenex 1-2 x a day. Denies chest pain, dyspnea, orthopnea, hemoptysis, fever, n/v/d, edema., continue to have nasal congestion   April 06, 2008 1:39 PM This pt was hospitalized 12/30- 1/1 for AB flare.  Then saw NP on 1/7 as above for posthosp ov.  At the last Ov the NP Rx: Increase Advair to 500/50mg  1 puffs two times a day x 2 weeks, one  sample given.  Finish prednisone.  Mucinex DM two times a day as needed cough/congesiton Saline nasal 2 puffs four times a day or rinses as needed  Xopenex neb or inhaler as needed wheezing.  Increase fluids.  Now pt is much better.  Not as much cough.  Less dyspnea.  No f/c/s.  Has some postnasal drip and feels congested in the throat with bloody nasal d/c. No CT sinus yet performed.  Only using xopenex as needed. Was on Advair 500 for two weeks now on 250 level. Pt denies any significant sore throat, nasal congestion or excess secretions, fever, chills, sweats, unintended weight loss, pleurtic or exertional chest pain, orthopnea PND, or leg swelling      Prior Medications  Reviewed Using: Patient Recall  Prior Medication List:  ADVAIR DISKUS 250-50 MCG/DOSE MISC (FLUTICASONE-SALMETEROL) Use 1 puff two times a day ASPIRIN EC 325 MG TBEC (ASPIRIN) Take CARDIZEM CD 240 MG CP24 (DILTIAZEM HCL COATED BEADS) 1 capsule by mouth once a day PREMARIN 0.625 MG TABS (ESTROGENS CONJUGATED) Take 1 tablet by mouth once a day SINGULAIR 10 MG TABS (MONTELUKAST SODIUM) Take 1 tablet by mouth once a day XOPENEX HFA 45 MCG/ACT AERO (LEVALBUTEROL TARTRATE) Inhale 1-2 puff using inhaler every four hours as needed [BMN] FLONASE 50 MCG/ACT SUSP (FLUTICASONE PROPIONATE) Use 2 puffs each nostril two times a day   Current Allergies (reviewed today): No known allergies  Past Medical History:    Reviewed history from 03/10/2008 and no changes required:       Asthma-Advair 250/50 two times a day        Atrial fibrillation- f/b Dr. 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    Review of Systems  The patient denies anorexia, fever, hoarseness, chest pain, dyspnea on exertion, peripheral edema, prolonged cough, headaches, and severe indigestion/heartburn.         occasional frontal headaches noted   Vital Signs:  Patient Profile:   65 Years Old Female Weight:      157.6 pounds O2 Sat:      92 % O2 treatment:    Room Air Temp:     97.7 degrees F oral Pulse rate:   84 / minute BP sitting:  118 / 72  (left arm) Cuff size:   regular  Vitals Entered By: Michel Bickers CMA (April 06, 2008 1:37 PM)                Physical Exam  General:     well developed, well nourished, in no acute distress Head:     normocephalic and atraumatic Eyes:     PERRLA/EOM intact; conjunctiva and sclera clear Nose:      severly dried nasal mucosaclear nasal discharge, erythema, and bloody discharge.   Mouth:     white exudate.  this covers the posterior oropharynx area Neck:     no masses, thyromegaly, or abnormal cervical nodes Chest Wall:     no deformities  noted Lungs:     decreased BS bilateral and prolonged exhilation.   Heart:     regular rate and rhythm, S1, S2 without murmurs, rubs, gallops, or clicks Abdomen:     bowel sounds positive; abdomen soft and non-tender without masses, or organomegaly Msk:     no deformity or scoliosis noted with normal posture Pulses:     pulses normal Extremities:     no clubbing, cyanosis, edema, or deformity noted Neurologic:     CN II-XII grossly intact with normal reflexes, coordination, muscle strength and tone Skin:     intact without lesions or rashes Cervical Nodes:     no significant adenopathy Axillary Nodes:     no significant adenopathy Psych:     alert and cooperative; normal mood and affect; normal attention span and concentration   Impression & Recommendations:  Problem # 1:  ASTHMA (ICD-493.90) Assessment: Improved Asthmatic bronchitic flare now improved.  Probable sinusitis as ppting factor.  plan: Use Neil Med sinus wash system two times a day Stay on advair 250 two times a day Rov two months  Problem # 2:  CANDIDIASIS, ORAL (ICD-112.0) Assessment: Deteriorated Oral candidiasis due to recent ABX and prednisone use plan: diflucan for 5 days  Orders: Est. Patient Level III ([address])   Medications Added to Medication List This Visit: 1)  Fluconazole 100 Mg Tabs (Fluconazole) .... Take two by mouth once then one daily until gone  Complete Medication List: 1)  Advair Diskus 250-50 Mcg/dose Misc (Fluticasone-salmeterol) .... Use 1 puff two times a day 2)  Aspirin Ec 325 Mg Tbec (Aspirin) .... Take 3)  Cardizem Cd 240 Mg Cp24 (Diltiazem hcl coated beads) .[name].. 1 capsule by mouth once a day 4)  Premarin 0.625 Mg Tabs (Estrogens conjugated) .... Take 1 tablet by mouth once a day 5)  Singulair 10 Mg Tabs (Montelukast sodium) .... Take 1 tablet by mouth once a day 6)  Xopenex Hfa 45 Mcg/act Aero (Levalbuterol tartrate) .... Inhale 1-2 puff using inhaler every four hours as  needed 7)  Flonase 50 Mcg/act Susp (Fluticasone propionate) .... Use 2 puffs each nostril two times a day 8)  Fluconazole 100 Mg Tabs (Fluconazole) .... Take two by mouth once then one daily until gone  Patient Instructions: 1)  Use the NeilMed nasal rinse at least daily washing out both nares thoroughly.  Place one packet of Sinus Wash ingredients into the nasal wash bottle then fill to the dotted line with lukewarm tap water.  Lean over the sink and rinse each nostril out thoroughly and avoid letting the rinse go into the throat.   2)  Stay on advair twice daily 3)  Use diflucan until gone 4)  No other medication changes 5)  Return two  months Prescriptions: FLUCONAZOLE 100 MG TABS (FLUCONAZOLE) take two by mouth once then one daily until gone  #7 x 0   Entered and Authorized by:   Storm Frisk MD   Signed by:   Storm Frisk MD on 04/06/2008   Method used:   Electronically to        [facility]* (retail)       [address]       [place], [place]  [address]       Ph: [phone] or [phone]       Fax: [phone]   RxID:   [id number]   Appended Document: Pulmonary OV fax Elmore Guise

## 2010-04-03 NOTE — Letter (Signed)
Summary: Appointment - Missed  [facility], Main Office  [address]   [place], [place] [address]   Phone: [phone]  Fax: [phone]     September 06, 2008 MRN: [id number]   Anders Grant [address] [place], [place]  [address]   Dear Ms. GARRISON,  Our records indicate you missed your appointment on  July 6,2010 with  Dr. Daleen Squibb It is very important that we reach you to reschedule this appointment. We look forward to participating in your health care needs. Please contact us at the number listed above at your earliest convenience to reschedule this appointment.     Sincerely,   Lorne Skeens  [facility] Scheduling Team

## 2010-04-03 NOTE — Assessment & Plan Note (Signed)
Summary: Cardiology Office Visit   Primary Provider:  Dr. Elmore Guise  CC:  a-fib/ anxiety due loss of husband 5 months ago.  History of Present Illness: 65 y/o female with prior history of PAF, previously followed by Dr. Daleen Squibb.  She has not been seen in 3 years.  Unfortunately, she lost her husband approx. 5 months ago and has been battling with a significant amount of grief since then.  She has also had repeated respiratory illnesses and asthma flares requiring abx and steroids on several occasions since December.  In January she began to experience palpitations described as fast and irreg. heart beats assoc. with anxiety, occurring several x/week, lasting and resolving spont. She has no associated c/p, dyspnea, or presyncope.  Despite her hospitalizations, nothing was apparently picked up on the monitor while hospitalized.  Over the past several weeks she has had a slight uptick in the freq. of palpitations.  They are most likely to occur @ night when she is by herself.  She has never noticed them when she is out and about or busy during the day.  She notes that there is a significant emotional component and that Ss are often brought on during emotionally trying times, most recently last Friday.  EKG  Procedure date:  08/08/2008  Findings:      RSR, small q's inferior and ant. lat leads.   Current Medications (verified): 1)  Advair Diskus 250-50 Mcg/dose Misc (Fluticasone-Salmeterol) .... Use 1 Puff Two Times A Day 2)  Aspirin Ec 325 Mg Tbec (Aspirin) .... Take 3)  Cardizem Cd 240 Mg Cp24 (Diltiazem Hcl Coated Beads) .[name].. 1 Capsule By Mouth Once A Day 4)  Singulair 10 Mg Tabs (Montelukast Sodium) .... Take 1 Tablet By Mouth Once A Day 5)  Xopenex Hfa 45 Mcg/act Aero (Levalbuterol Tartrate) .... Inhale 1-2 Puff Using Inhaler Every Four Hours As Needed 6)  Estradiol 1 Mg Tabs (Estradiol) .[name].. 1 By Mouth Once Daily 7)  Nasonex 50 Mcg/act Susp (Mometasone Furoate) .[name].. 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   Family History: Reviewed history from 03/10/2008 and no changes required. no cancer, DM or heart dz.  asthma hx.   Social History: Reviewed history from 03/10/2008 and no changes required. former smoker Widow- husband passed 12/09 2 sons.  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   Review of Systems       Notable for anxiety, palpitations, grieving.  OTW all systems reviewed and negative.  Vital Signs:  Patient profile:   65 year old female Height:      65 inches Weight:      153.25 pounds BMI:     25.59 Pulse rate:   80 / minute BP sitting:   135 / 80  (right arm) Cuff size:   regular  Vitals Entered By: Flonnie Overman (August 08, 2008 9:49 AM)  Physical Exam  General:  Well developed, well nourished, in no acute distress. Head:  HEENT: NL Neck:  Neck supple, no JVD. No masses, thyromegaly or abnormal cervical nodes. Chest Wall:  no deformities or breast masses noted Lungs:  Clear bilaterally to auscultation and percussion. Heart:  Reg s1/s2, no s3/s4/murmurs Abdomen:  Bowel sounds positive; abdomen soft and non-tender without masses, organomegaly, or hernias  noted. No hepatosplenomegaly. Msk:  Back normal, normal gait. Muscle strength and tone normal. Pulses:  pulses normal in all 4 extremities Extremities:  No cce. Neurologic:  Alert and oriented x 3. Skin:  Intact without lesions.  Red, nonraised rash on bilat. lower ext to knee. Cervical Nodes:  no significant adenopathy Psych:  Normal affect, though very emotional and crying @ times on mention of her husband.   Impression & Recommendations:  Problem # 1:   ATRIAL FIBRILLATION (ICD-427.31) Pt. has been having recurrent palpitations that she thinks may be her a. fib.  These are often assoc. with emotional upset and anxiety.  I've increased her cardizem to 300mg  daily and we'll check a bmet, tsh, mg today.  We'll also set her up for an event monitor and echo.  Her CHADS score is 1 (HTN).  She remains on ASA. Orders: EKG w/ Interpretation (93000) TLB-BMP (Basic Metabolic Panel-BMET) (80048-METABOL) TLB-TSH (Thyroid Stimulating Hormone) (84443-TSH) TLB-Magnesium (Mg) (83735-MG) Holter Monitor (Holter Monitor) Echocardiogram (Echo)  Problem # 2:  ANXIETY (ICD-300.00) Suspect this may be playing a role.  Pt. has had significant emotional upset since the death of her husband is is still grieving.  She may benefit for counseling/anti-depressant therapy- defer to primary care.  Patient Instructions: 1)  Your physician recommends that you schedule a follow-up appointment in: 1 month with Dr. Daleen Squibb 2)  Your physician has recommended you make the following change in your medication: Increase Cardizem to 300 mg daily 3)  Your physician has recommended that you wear an event monitor.  Event monitors are medical devices that record the heart's electrical activity. Doctors most often use these monitors to diagnose arrhythmias. Arrhythmias are problems with the speed or rhythm of the heartbeat. The monitor is a small, portable device. You can wear one while you do your normal daily activities. This is usually used to diagnose what is causing palpitations/syncope (passing out). 4)  Your physician has requested that you have an echocardiogram.  Echocardiography is a painless test that uses sound waves to create images of your heart. It provides your doctor with information about the size and shape of your heart and how well your heart's chambers and valves are working.  This procedure takes approximately one hour. There are no restrictions for this  procedure. Prescriptions: CARDIZEM CD 300 MG XR24H-CAP (DILTIAZEM HCL COATED BEADS) take one daily  #30 x 6   Entered by:   Dossie Arbour, RN, BSN   Authorized by:   Creig Hines, ANP-BC   Signed by:   Dossie Arbour, RN, BSN on 08/08/2008   Method used:   Electronically to        [facility] (retail)       [address]       [place], [place]  [address]       Ph: [phone] or [phone]       Fax: [phone]   RxID:   [id number]

## 2010-04-03 NOTE — Miscellaneous (Signed)
Summary: Rx for hosp discharge   Clinical Lists Changes  Medications: Changed medication from ADVAIR DISKUS 250-50 MCG/DOSE MISC (FLUTICASONE-SALMETEROL) Inhale 1 puff twice a day to ADVAIR DISKUS 500-50 MCG/DOSE  MISC (FLUTICASONE-SALMETEROL) One puff twice daily [BMN] - Signed Added new medication of AVELOX 400 MG  TABS (MOXIFLOXACIN HCL) By mouth daily [BMN] - Signed Added new medication of PREDNISONE 10 MG  TABS (PREDNISONE) Take as directed 4 each am x 4 days, 3 x 4 days, 2 x 4 days, 1 x 4 days then stop - Signed Rx of ADVAIR DISKUS 500-50 MCG/DOSE  MISC (FLUTICASONE-SALMETEROL) One puff twice daily;  #1 x 6 Brand medically necessary;  Signed;  Entered by: Storm Frisk MD;  Authorized by: Storm Frisk MD;  Method used: Print then Give to Patient Rx of AVELOX 400 MG  TABS (MOXIFLOXACIN HCL) By mouth daily;  #5 x 0 Brand medically necessary;  Signed;  Entered by: Storm Frisk MD;  Authorized by: Storm Frisk MD;  Method used: Print then Give to Patient Rx of PREDNISONE 10 MG  TABS (PREDNISONE) Take as directed 4 each am x 4 days, 3 x 4 days, 2 x 4 days, 1 x 4 days then stop;  #40 x 0;  Signed;  Entered by: Storm Frisk MD;  Authorized by: Storm Frisk MD;  Method used: Print then Give to Patient    Prescriptions: PREDNISONE 10 MG  TABS (PREDNISONE) Take as directed 4 each am x 4 days, 3 x 4 days, 2 x 4 days, 1 x 4 days then stop  #40 x 0   Entered and Authorized by:   Storm Frisk MD   Signed by:   Storm Frisk MD on 03/03/2008   Method used:   Print then Give to Patient   RxID:   [id number] AVELOX 400 MG  TABS (MOXIFLOXACIN HCL) By mouth daily Brand medically necessary #5 x 0   Entered and Authorized by:   Storm Frisk MD   Signed by:   Storm Frisk MD on 03/03/2008   Method used:   Print then Give to Patient   RxID:   [id number] ADVAIR DISKUS 500-50 MCG/DOSE  MISC (FLUTICASONE-SALMETEROL) One puff twice daily Brand medically necessary  #1 x 6   Entered and Authorized by:   Storm Frisk MD   Signed by:   Storm Frisk MD on 03/03/2008   Method used:   Print then Give to Patient   RxID:   [id number]

## 2010-04-03 NOTE — Assessment & Plan Note (Signed)
Summary: F6M/ANAS   Visit Type:  6 mo f/u Primary Provider:  Dr. Elmore Guise  CC:  no cardiac complaint today.  History of Present Illness: Elizabeth Mcconnell comes in today for evaluation and management of her paroxysmal atrial fibrillation.  She is doing remarkably well and her breathing is been stable. Generally when she has a respiratory exacerbation is when she is very to fit.  She denies any palpitations, orthopnea, PND, peripheral edema, or syncope.  Current Medications (verified): 1)  Advair Diskus 250-50 Mcg/dose Misc (Fluticasone-Salmeterol) .... Use 1 Puff Two Times A Day 2)  Aspirin 81 Mg Tbec (Aspirin) .... Take One Tablet By Mouth Daily 3)  Cardizem Cd 300 Mg Xr24h-Cap (Diltiazem Hcl Coated Beads) .... Take One Daily 4)  Singulair 10 Mg Tabs (Montelukast Sodium) .... Take 1 Tablet By Mouth Once A Day 5)  Xopenex Hfa 45 Mcg/act Aero (Levalbuterol Tartrate) .... Inhale 1-2 Puff Using Inhaler Every Four Hours As Needed 6)  Estradiol 1 Mg Tabs (Estradiol) .[name].. 1 By Mouth Once Daily 7)  Nasonex 50 Mcg/act Susp (Mometasone Furoate) .[name].. 1 By Mouth Once Daily 8)  Calcium-Vitamin D 500-200 Mg-Unit Tabs (Calcium Carbonate-Vitamin D) .[name].. 1 Tab Once Daily  Allergies: 1)  ! 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   Past Surgical History: Last updated: 08/26/2008 lung surgery 02/1965 Hysterectomy C-section x2  Family History: Last updated: 03/10/2008 no cancer, DM or heart dz.  asthma hx.   Social History: Last updated: 03/10/2008 former smoker Widow- husband passed 12/09 2 sons.  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   Risk Factors: Smoking Status: quit (01/15/2007)  Review of Systems       negative  other than history of present illness  Vital Signs:  Patient profile:   65 year old female Height:      65 inches Weight:      141 pounds BMI:     23.55 Pulse rate:   72 / minute Pulse rhythm:   regular BP sitting:   118 / 70  (left arm) Cuff size:   large  Vitals Entered By: Danielle Rankin, CMA (April 13, 2009 3:05 PM)  Physical Exam  General:  Well developed, well nourished, in no acute distress. Head:  normocephalic and atraumatic Eyes:  PERRLA/EOM intact; conjunctiva and lids normal. Neck:  Neck supple, no JVD. No masses, thyromegaly or abnormal cervical nodes. Chest Doxie Augenstein:  no deformities or breast masses noted Lungs:  raspy voice without rhonchi wheezes Heart:  Non-displaced PMI, chest non-tender; regular rate and rhythm, S1, S2 without murmurs, rubs or gallops. Carotid upstroke normal, no bruit. Normal abdominal aortic size, no bruits. Femorals normal pulses, no bruits. Pedals normal pulses. No edema, no varicosities. Msk:  Back normal, normal gait. Muscle strength and tone normal. Pulses:  pulses normal in all 4 extremities Extremities:  No clubbing or cyanosis. Neurologic:  Alert and oriented x 3. Skin:  Intact without lesions or rashes. Psych:  Normal affect.   Impression & Recommendations:  Problem # 1:  ATRIAL FIBRILLATION (ICD-427.31) Assessment Improved  Her updated medication list for this problem includes:    Aspirin 81 Mg Tbec (Aspirin) .[name]... Take one tablet by mouth daily  Patient Instructions: 1)  Your physician recommends that you schedule a follow-up appointment in: YEAR WITH DR Dollene Mallery 2)  Your physician  recommends that you continue on your current medications as directed. Please refer to the Current Medication list given to you today.

## 2010-04-03 NOTE — Assessment & Plan Note (Signed)
Summary: post hosp/apc   Chief Complaint:  post hosp.  History of Present Illness:  65 year old white female with a history of moderate persistent asthma with significant atopic features, very high positive IgE level.   March 10, 2008--returns for post hospitalization visit. Admitted 12/30-03/05/2007 for asthmatic exacerbation, wosrening dyspnea, cough, congestion, wheezing. Failed outpatient therapy, zpack, Symptoms worsened and required admission. . Tx with IV steroids, antibiotics, pulmonary toilet. Discharged on prednisone taper. Has few days left, currently on Prednisone 10mg  2 daily. . Finished Avelox yesterday. XRay showed chronic changes and CT chest was neg. for PE, shoed patchy ground glass densities in right lung- ?mild aveolitis. Since discharge, feeling better but not to baseline. Rare wheezing, still using xopenex 1-2 x a day. Denies chest pain, dyspnea, orthopnea, hemoptysis, fever, n/v/d, edema., continue to have nasal congestion      Updated Prior Medication List: ADVAIR DISKUS 250-50 MCG/DOSE MISC (FLUTICASONE-SALMETEROL) Use 1 puff two times a day ASPIRIN EC 325 MG TBEC (ASPIRIN) Take CARDIZEM CD 240 MG CP24 (DILTIAZEM HCL COATED BEADS) 1 capsule by mouth once a day PREMARIN 0.625 MG TABS (ESTROGENS CONJUGATED) Take 1 tablet by mouth once a day SINGULAIR 10 MG TABS (MONTELUKAST SODIUM) Take 1 tablet by mouth once a day XOPENEX HFA 45 MCG/ACT AERO (LEVALBUTEROL TARTRATE) Inhale 1-2 puff using inhaler every four hours as needed [BMN] PREDNISONE 10 MG  TABS (PREDNISONE) Take as directed 4 each am x 4 days, 3 x 4 days, 2 x 4 days, 1 x 4 days then stop FLONASE 50 MCG/ACT SUSP (FLUTICASONE PROPIONATE) Use 2 puffs each nostril two times a day  Current Allergies (reviewed today): No known allergies   Past Medical History:    Asthma-Advair 250/50 two times a day     Atrial fibrillation- f/b Dr. 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    Family History:   no cancer, DM or heart dz.     asthma hx.   Social History:    former smoker    Widow- husband passed 12/09    2 sons.     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      Vital Signs:  Patient Profile:   65 Years Old Female Weight:      156.13 pounds O2 Sat:      95 % O2 treatment:    Room Air Temp:     97.6 degrees F oral Pulse rate:   63 / minute BP sitting:   142 / 78  (left arm) Cuff size:   regular  Vitals Entered By: Abigail Miyamoto RN (March 10, 2008 11:50 AM)             Comments Medications reviewed with patient Abigail Miyamoto RN  March 10, 2008 11:51 AM      Physical Exam  GEN: A/Ox3; pleasant , NAD HEENT:  [place]/AT, , EACs-clear, TMs-wnl, NOSE-pale, with clear discharge, THROAT-clear NECK:  Supple w/ fair ROM; no JVD; normal carotid impulses w/o bruits; no thyromegaly or nodules palpated; no lymphadenopathy. CHEST:  Coarse BS with few exp wheeizng  HEART:  RRR, no m/r/g   ABDOMEN:  Soft & nt; nml bowel sounds; no organomegaly or masses detected. EXT: Warm bil,  no calf pain, edema, clubbing, pulses intact        Impression & Recommendations:  Problem # 1:  ASTHMA (ICD-493.90) Exacerbation -requiring hospitalization Improving but not back to baseline.  REC: Increase  Advair to 500/50mg  1 puffs two times a day x 2 weeks, sample given.  Finish prednisone.  Mucinex DM two times a day as needed cough/congesiton Saline nasal 2 puffs four times a day or rinses as needed  Xopenex neb or inhaler as needed wheezing.  Increase fluids.  follow up 2 weeks. Dr. Delford Field and as needed  'Please contact office for sooner follow up if symptoms do not improve or worsen  Her updated medication list for this problem includes:    Advair Diskus 250-50 Mcg/dose Misc (Fluticasone-salmeterol) ..... Use 1 puff two times a day    Singulair 10 Mg Tabs (Montelukast sodium) .[name]... Take 1 tablet by mouth once a day    Xopenex Hfa 45 Mcg/act Aero (Levalbuterol tartrate) ..... Inhale  1-2 puff using inhaler every four hours as needed    Prednisone 10 Mg Tabs (Prednisone) .[name]... Take as directed 4 each am x 4 days, 3 x 4 days, 2 x 4 days, 1 x 4 days then stop  Orders: Est. Patient Level IV (86578)   Medications Added to Medication List This Visit: 1)  Advair Diskus 250-50 Mcg/dose Misc (Fluticasone-salmeterol) .... Use 1 puff two times a day 2)  Flonase 50 Mcg/act Susp (Fluticasone propionate) .... Use 2 puffs each nostril two times a day  Complete Medication List: 1)  Advair Diskus 250-50 Mcg/dose Misc (Fluticasone-salmeterol) .... Use 1 puff two times a day 2)  Aspirin Ec 325 Mg Tbec (Aspirin) .... Take 3)  Cardizem Cd 240 Mg Cp24 (Diltiazem hcl coated beads) .[name].. 1 capsule by mouth once a day 4)  Premarin 0.625 Mg Tabs (Estrogens conjugated) .... Take 1 tablet by mouth once a day 5)  Singulair 10 Mg Tabs (Montelukast sodium) .... Take 1 tablet by mouth once a day 6)  Xopenex Hfa 45 Mcg/act Aero (Levalbuterol tartrate) .... Inhale 1-2 puff using inhaler every four hours as needed 7)  Prednisone 10 Mg Tabs (Prednisone) .... Take as directed 4 each am x 4 days, 3 x 4 days, 2 x 4 days, 1 x 4 days then stop 8)  Flonase 50 Mcg/act Susp (Fluticasone propionate) .... Use 2 puffs each nostril two times a day   Patient Instructions: 1)  Increase Advair to 500/50mg  1 puffs two times a day x 2 weeks, sample given.  2)  Finish prednisone.  3)  Mucinex DM two times a day as needed cough/congesiton 4)  Saline nasal 2 puffs four times a day or rinses as needed  5)  Xopenex neb or inhaler as needed wheezing.  6)  Increase fluids.  7)  follow up 2 weeks. Dr. Delford Field and as needed  8)  'Please contact office for sooner follow up if symptoms do not improve or worsen    ]

## 2010-04-03 NOTE — Progress Notes (Signed)
Summary: Needs OV then will refill nasonex  Phone Note Outgoing Call Call back at [facility] Phone [phone]   Call placed by: [name] RN,  April 26, 2009 1:01 PM Call placed to: Patient Summary of Call: Received Rx request for nasonex however pt last saw PW 04/06/08 and was told to follow up in 2 mo.  Called pt's home number-LMOMTCB to inform pt of [place] law and advise her she needs to make a follow up appt with PW.  Once follow up appt is scheduled will send enough rxs to pharmacy to last until follow up but pt MUST come in for further refills.   Initial call taken by: [name] RN,  April 26, 2009 1:03 PM  Follow-up for Phone Call        [facility] [name] RN  April 27, 2009 4:12 PM  called pt's home number, [facility]  [name] RN  April 28, 2009 12:38 PM  pt called back, call her back at home #.  Also, she does hav an appt on 05/02/2009 w/Dr. Delford Field.  She said to let you know she is home, just call her there.  Follow-up by: Eugene Gavia,  April 28, 2009 3:34 PM  Additional Follow-up for Phone Call Additional follow up Details #1::        LMOMTCB to inform pt nasonex has been sent to pharmacy but she needs to keep ov with PW for further rxs.  [name] RN  April 28, 2009 4:11 PM

## 2010-04-03 NOTE — Progress Notes (Signed)
Summary: LMTCB-sick  Phone Note Call from Patient Call back at [phone]   Caller: Patient Call For: Elizabeth Mcconnell Reason for Call: Acute Illness, Talk to Nurse, Talk to Doctor Complaint: Cough/Sore throat Summary of Call: have sinus infection again, started zpac over W/E.  Should she get stronger antibiotic to keep herself out of [place].  Head and sinus congestion, drainage, no fever, chills. [facility] Initial call taken by: Eugene Gavia,  May 03, 2008 10:59 AM  Follow-up for Phone Call        called and spoke with pt. pt states she started taking zpak on saturday.  pt c/o questionable sinus infection,  head and sinus congestion and drainage, nasal drainage is mostly clear with some blood, and sore throat.  Pt c/o some PND and will cough up yellow colored sputum.  Pt states she is using mucinex and nasal saline.  Pt is worried and does not want this infection ot go into her chest.  pt just wanted to check with PW and make sure it's ok for her to be on the zpak or if she needs something stronger. please advise.  Megan Reynolds LPN  May 04, 4538 11:26 AM   Additional Follow-up for Phone Call Additional follow up Details #1::        rx: prednisone  10mg  4 a day times two days then down by one every two days until off  Avelox 400mg  daily for 5 days stay on flonase get the [facility] sinus salt rinse blue packet Use the [employer] nasal rinse at least daily washing out both nares thoroughly.  Place one packet of Sinus Wash ingredients into the nasal wash bottle then fill to the dotted line with lukewarm tap water.  Lean over the sink and rinse each nostril out thoroughly and avoid letting the rinse go into the throat.    ov if not improved Additional Follow-up by: Storm Frisk MD,  May 04, 2008 7:26 AM    Additional Follow-up for Phone Call Additional follow up Details #2::    Attempted to notify pt.  LMOM at home and cell number.   Rx sent to pharmacy electronically.  Cloyde Reams RN  May 04, 2008 9:30 AM   pt aware of pw's response and meds sent tpo pharmacy per erika Follow-up by: Philipp Deputy CMA,  May 04, 2008 10:58 AM  New/Updated Medications: PREDNISONE 10 MG TABS (PREDNISONE) Take 4 tabs x 2 days, 3 tabs x 2 days, 2 tabs x 2 days, 1 tab x 2 days then stop. AVELOX 400 MG TABS (MOXIFLOXACIN HCL) Take 1 tablet by mouth once a day x 5 days   Prescriptions: AVELOX 400 MG TABS (MOXIFLOXACIN HCL) Take 1 tablet by mouth once a day x 5 days  #5 x 0   Entered by:   Cloyde Reams RN   Authorized by:   Storm Frisk MD   Signed by:   Cloyde Reams RN on 05/04/2008   Method used:   Electronically to        [facility]* (retail)       [address]       [place], [place]  [address]       Ph: [phone] or [phone]       Fax: [phone]   RxID:   [id number] PREDNISONE 10 MG TABS (PREDNISONE) Take 4 tabs x 2 days, 3 tabs x 2  days, 2 tabs x 2 days, 1 tab x 2 days then stop.  #20 x 0   Entered by:   Cloyde Reams RN   Authorized by:   Storm Frisk MD   Signed by:   Cloyde Reams RN on 05/04/2008   Method used:   Electronically to        [facility]* (retail)       [address]       [place], [place]  [address]       Ph: [phone] or [phone]       Fax: [phone]   RxID:   [id number]

## 2010-04-03 NOTE — Miscellaneous (Signed)
Summary: Rx for d/c  Clinical Lists Changes  Medications: Added new medication of FLUCONAZOLE 100 MG TABS (FLUCONAZOLE) one by mouth daiy - Signed Rx of FLUCONAZOLE 100 MG TABS (FLUCONAZOLE) one by mouth daiy;  #5 x 0;  Signed;  Entered by: Storm Frisk MD;  Authorized by: Storm Frisk MD;  Method used: Print then Give to Patient    Prescriptions: FLUCONAZOLE 100 MG TABS (FLUCONAZOLE) one by mouth daiy  #5 x 0   Entered and Authorized by:   Storm Frisk MD   Signed by:   Storm Frisk MD on 03/03/2008   Method used:   Print then Give to Patient   RxID:   [id number]

## 2010-04-03 NOTE — Medication Information (Signed)
Summary: Approved for Singulair/[facility]  Approved for Singulair/[facility]   Imported By: Sherian Rein 12/05/2008 10:48:10  _____________________________________________________________________  External Attachment:    Type:   Image     Comment:   External Document

## 2010-04-03 NOTE — Miscellaneous (Signed)
  Clinical Lists Changes  Medications: Changed medication from XOPENEX HFA 45 MCG/ACT AERO (LEVALBUTEROL TARTRATE) Inhale 1-2 puff using inhaler every four hours to XOPENEX HFA 45 MCG/ACT AERO (LEVALBUTEROL TARTRATE) Inhale 1-2 puff using inhaler every four hours as needed [BMN] - Signed Rx of XOPENEX HFA 45 MCG/ACT AERO (LEVALBUTEROL TARTRATE) Inhale 1-2 puff using inhaler every four hours as needed;  #1 x 6 Brand medically necessary;  Signed;  Entered by: Storm Frisk MD;  Authorized by: Storm Frisk MD;  Method used: Print then Give to Patient    Prescriptions: XOPENEX HFA 45 MCG/ACT AERO (LEVALBUTEROL TARTRATE) Inhale 1-2 puff using inhaler every four hours as needed Brand medically necessary #1 x 6   Entered and Authorized by:   Storm Frisk MD   Signed by:   Storm Frisk MD on 02/04/2007   Method used:   Print then Give to Patient   RxID:   [id number]

## 2010-04-05 NOTE — Progress Notes (Signed)
Summary: cough > augmenitn rx  Phone Note Call from Patient Call back at [facility] Phone [phone] Call back at [phone]   Caller: Patient Call For: wright Summary of Call: Pt c/o congestion, cough since yesterday, wants something called in pls advise. Initial call taken by: Darletta Moll,  February 19, 2010 3:28 PM  Follow-up for Phone Call        Spoke with pt.  She is c/o prod cough with yellow sputum and sinus drainage x 3 days.  She states "feel like I am getting a chest cold". She denies fever, changes in breathing, or wheeze.  Will forward to doc of the day since PW is not here all wk.  Please advise thanks! Follow-up by: Vernie Murders,  February 19, 2010 4:57 PM  Additional Follow-up for Phone Call Additional follow up Details #1::        She has been getting more sinus congestion over the past few days.  She feels this is starting to settle in her chest.  She has been feeling achy and chills, but no definite fever.  She has been using nasonex and nasal irrigation.  She is also using advair and singulair.  She has not had much wheeze or chest tightness, and has not needed to use xopenex.  She usually takes augmentin and this clears her symptoms.  She has been coughing with yellow sputum.  She denies hemoptysis, or epistaxis.  Will give her 7 day course of augmentin.  Advised her to call if her symptoms do not improve.  I don't think she needs prednisone at this time.  Will forward to Dr. Delford Field for review. Additional Follow-up by: Coralyn Helling MD,  February 19, 2010 5:00 PM    Additional Follow-up for Phone Call Additional follow up Details #2::    add pulse prednisone 10mg  Take 4 daily for two days, then 3 daily for two days, then two daily for two days then one daily for two days then stop  Follow-up by: Storm Frisk MD,  February 19, 2010 5:21 PM  Additional Follow-up for Phone Call Additional follow up Details #3:: Details for Additional Follow-up Action Taken: Spoke  with pt and made aware that augmentin and also prednisone taper has been sent to pharm.  Pt verbalized understanding.  Additional Follow-up by: Vernie Murders,  February 19, 2010 5:26 PM  New/Updated Medications: AUGMENTIN 875-125 MG TABS (AMOXICILLIN-POT CLAVULANATE) one two times a day PREDNISONE 10 MG TABS (PREDNISONE) 4 x 2 days, 3 x 2 days, 2 x 2 days, 1 x 2 days, then stop Prescriptions: PREDNISONE 10 MG TABS (PREDNISONE) 4 x 2 days, 3 x 2 days, 2 x 2 days, 1 x 2 days, then stop  #20 x 0   Entered by:   Vernie Murders   Authorized by:   Storm Frisk MD   Signed by:   Vernie Murders on 02/19/2010   Method used:   Electronically to        [id number]* (retail)       [address]       [place], [place]  [address]       Ph: [phone]       Fax: [phone]   RxID:   [id number] AUGMENTIN 875-125 MG TABS (AMOXICILLIN-POT CLAVULANATE) one two times a day  #14 x 0   Entered and Authorized by:   Coralyn Helling MD   Signed by:   Coralyn Helling MD on 02/19/2010   Method used:   Electronically  to        [facility] [address] (retail)       [address]       [place], [place]  [address]       Ph: [phone]       Fax: [phone]   RxID:   [id number]

## 2010-04-05 NOTE — Progress Notes (Signed)
Summary: nos appt  Phone Note Call from Patient   Caller: [email or web address]  Call For: Elizabeth Mcconnell Summary of Call: LMTCB x2 to rsc nos from 12/14. Initial call taken by: Darletta Moll,  February 16, 2010 8:41 AM

## 2010-04-06 NOTE — Medication Information (Signed)
Summary: Rx for Singulair/[facility]  Rx for Singulair/[facility]   Imported By: Esmeralda Links D'jimraou 05/19/2007 13:31:49  _____________________________________________________________________  External Attachment:    Type:   Image     Comment:   External Document

## 2010-04-06 NOTE — Medication Information (Signed)
Summary: Prior Auth for Singulair/[facility]  Prior Auth for Singulair/[facility]   Imported By: Sherian Rein 11/30/2008 09:48:38  _____________________________________________________________________  External Attachment:    Type:   Image     Comment:   External Document

## 2010-05-25 ENCOUNTER — Encounter: Payer: Self-pay | Admitting: Critical Care Medicine

## 2010-05-25 ENCOUNTER — Other Ambulatory Visit: Payer: Self-pay | Admitting: *Deleted

## 2010-05-25 MED ORDER — FLUTICASONE PROPIONATE 50 MCG/ACT NA SUSP: NASAL | Status: DC

## 2010-05-25 NOTE — Telephone Encounter (Signed)
Refill request was received in EMR on 05/22/10.

## 2010-06-14 ENCOUNTER — Encounter: Payer: Self-pay | Admitting: Obstetrics and Gynecology

## 2010-06-18 LAB — BASIC METABOLIC PANEL
BUN: 11 mg/dL (ref 6–23)
CO2: 24 mEq/L (ref 19–32)
Calcium: 9.2 mg/dL (ref 8.4–10.5)
Chloride: 104 mEq/L (ref 96–112)
Creatinine, Ser: 0.79 mg/dL (ref 0.4–1.2)
GFR calc Af Amer: >60 mL/min (ref >60)
GFR calc non Af Amer: >60 mL/min (ref >60)
Glucose, Bld: 122 mg/dL — ABNORMAL HIGH (ref 70–99)
Potassium: 3.6 mEq/L (ref 3.5–5.1)
Sodium: 141 mEq/L (ref 135–145)

## 2010-06-18 LAB — CBC
HCT: 40.5 % (ref 36.0–46.0)
Hemoglobin: 13.7 g/dL (ref 12.0–15.0)
MCHC: 33.8 g/dL (ref 30.0–36.0)
MCV: 86.8 fL (ref 78.0–100.0)
Platelets: 157 10*3/uL (ref 150–400)
RBC: 4.67 MIL/uL (ref 3.87–5.11)
RDW: 14.7 % (ref 11.5–15.5)
WBC: 17.6 10*3/uL — ABNORMAL HIGH (ref 4.0–10.5)

## 2010-06-19 ENCOUNTER — Encounter (INDEPENDENT_AMBULATORY_CARE_PROVIDER_SITE_OTHER): Payer: Medicare Other | Admitting: Obstetrics and Gynecology

## 2010-06-19 DIAGNOSIS — M899 Disorder of bone, unspecified: Secondary | ICD-10-CM

## 2010-06-19 DIAGNOSIS — N951 Menopausal and female climacteric states: Secondary | ICD-10-CM

## 2010-06-19 DIAGNOSIS — R82998 Other abnormal findings in urine: Secondary | ICD-10-CM

## 2010-06-19 DIAGNOSIS — M949 Disorder of cartilage, unspecified: Secondary | ICD-10-CM

## 2010-06-19 DIAGNOSIS — N9089 Other specified noninflammatory disorders of vulva and perineum: Secondary | ICD-10-CM

## 2010-06-22 ENCOUNTER — Ambulatory Visit (INDEPENDENT_AMBULATORY_CARE_PROVIDER_SITE_OTHER): Payer: Medicare Other | Admitting: Cardiology

## 2010-06-22 ENCOUNTER — Encounter: Payer: Self-pay | Admitting: Cardiology

## 2010-06-22 VITALS — BP 122/76 | HR 73 | Ht 65.0 in | Wt 129.1 lb

## 2010-06-22 DIAGNOSIS — I4891 Unspecified atrial fibrillation: Secondary | ICD-10-CM

## 2010-06-22 NOTE — Progress Notes (Signed)
   Patient ID: Harland Dingwall, female    DOB: Apr 11, 1945, 65 y.o.   MRN: [id number]  HPI Mrs Rinaldo Ratel returns today for E and M of her PAF. She has had a good year with no recurrences. Her asthma has also been well controlled.   EKG shows NSR with no changes.   Review of Systems  All other systems reviewed and are negative.      Physical Exam  Nursing note and vitals reviewed. Constitutional: She is oriented to person, place, and time. She appears well-developed and well-nourished. No distress.  HENT:  Head: Normocephalic and atraumatic.  Eyes: EOM are normal. Pupils are equal, round, and reactive to light.  Neck: Neck supple. No JVD present. No tracheal deviation present. No thyromegaly present.  Cardiovascular: Normal rate, regular rhythm, normal heart sounds and intact distal pulses.   Pulmonary/Chest: Effort normal and breath sounds normal. She has no wheezes.  Abdominal: Soft. Bowel sounds are normal.  Musculoskeletal: Normal range of motion. She exhibits no edema.  Neurological: She is alert and oriented to person, place, and time.  Skin: Skin is dry.  Psychiatric: She has a normal mood and affect.

## 2010-06-22 NOTE — Assessment & Plan Note (Signed)
She is doing remarkedly well. No change in meds.

## 2010-06-22 NOTE — Patient Instructions (Signed)
Your physician recommends that you schedule a follow-up appointment in: 1 year with Dr. Wall  

## 2010-07-17 NOTE — H&P (Signed)
[name]             ACCOUNT NO.:  [id number]   MEDICAL RECORD NO.:  [id number]          PATIENT TYPE:  EMS   LOCATION:  ED                           FACILITY:  [facility]   PHYSICIAN:  Coralyn Helling, MD        DATE OF BIRTH:  1945-06-25   DATE OF ADMISSION:  03/02/2008  DATE OF DISCHARGE:                              HISTORY & PHYSICAL   CHIEF COMPLAINT:  Shortness of breath.   HISTORY OF PRESENT ILLNESS:  This is a pleasant 65 year old female who  reports she was in her usual state of health until approximately  February 25, 2008.  At that point she developed generalized malaise,  with aches and pains.  The 25th she began to developed worsening  generalized body aches, followed by the 26th, where she began to notice  significant nasal congestion, postnasal drip, and headache with  productive cough and initially brown sputum.  As this progressed to the  point on the 27th where she would occasionally have blood tinged mucus  when blowing her nose.  She had been taking her rescue short acting beta  agonist as prescribed.  During typical time, she requires no rescue  Xopenex.  However, was requiring multiple repeat dosing of Xopenex at  home initially on the 27th had some improvement with short acting beta  agonist.  However, over the course of the 28th this became mostly  largely ineffective.  She was called in a prescription for azithromycin  on the 27th.  On December 28, she continued to worsen.  She had been  also trying modified nasal hygiene approach with saline rinses, Afrin,  and Nasonex with minimal improvement.  She was reported to the Urgent  Care on the 29th with progression of the above-mentioned symptoms.  At  that point she was given an intramuscular steroid injection and  instructed to continue antibiotics.  She reports over the night she  continued to have significant shortness of breath, chest tightness,  increased weakness, decreased activity tolerance, and  productive cough.  She also was becoming more and more anxious.  She presented to the  emergency room with the above-mentioned symptoms.  Her temperature was  100, saturations 92%.  The pulmonary service has been asked to evaluate  secondary to failure to respond to outpatient therapies and furthermore  she has been followed by Dr. Shan Levans in the outpatient setting.   PAST MEDICAL HISTORY:  Severe atopic asthma with extremely high IG  levels, paroxysmal atrial fibrillation felt to be secondary to  albuterol, allergic rhinitis, history of oral candidiasis, history of on  and off anxiety and a history of on and off tobacco abuse.  Prior  history of recurrent spontaneous pneumothorax, with left lower lobe  lobectomy.   CURRENT MEDICATIONS:  1. Reported as Xopenex nebulizer 1.25 mg nebulized p.r.n.  2. Advair 250/50 one spray b.i.d.  3. Singulair 10 mg p.r.n.  4. Nasonex p.r.n.  5. Cardizem is 360 mg p.o. daily.   ALLERGIES:  None, with exception of multiple environmental and seasonal  allergies.   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  Recently lost her husband 2 weeks ago and is  now a widow.  Currently able to carry out all activities of daily  living.   FAMILY HISTORY:  Not applicable.   REVIEW OF SYSTEMS:  Per HPI.   PHYSICAL EXAM:  VITAL SIGNS:  Temperature 100, heart rate 101,  respirations 15-20, blood pressure 141/73, saturations 92% on room air.  GENERAL:  This is an anxious 65 year old well-developed female with no  acute distress, but mildly anxious.  HEENT:  Posterior pharynx has erythema, there is white exudative fluid  and white raised rash on the posterior pharynx.  NECK:  His has some mild painful adenopathy.  CHEST:  Wheezing throughout.  CARDIAC:  Regular rate and rhythm.  ABDOMEN:  Soft, nontender.  GU:  Deferred.  EXTREMITIES:  No edema.   LAB DATA:  White blood cell count 15, hemoglobin  14.1, hematocrit 41.6,  platelet count 170,000.  BNP 78.  Sodium 139, potassium 3.6, chloride  105, CO2 22, BUN 6, creatinine 0.71, glucose 120, D-dimer negative.  Cardiac enzymes negative.   IMPRESSION AND PLAN:  Acute exacerbation of asthma, probably secondary  to acute sinusitis, further complicated by oral candidiasis.   PLAN:  To obtain CT of sinuses, provide IV steroids, panculture, empiric  IV Avelox in the setting of failed therapy with outpatient azithromycin.  We will provide scheduled bronchodilators in the form of nebulized  therapy, provide aggressive nasal hygiene regimen, including nasal  rinses, and nasal steroids.  She will be admitted for failure to respond  to outpatient therapy.      Zenia Resides, NP      Coralyn Helling, MD  Electronically Signed    PB/MEDQ  D:  03/02/2008  T:  03/02/2008  Job:  [id number]   cc:   Charlcie Cradle. Delford Field, MD, FCCP  [address]  [place]  [place] [address]

## 2010-07-20 NOTE — Assessment & Plan Note (Signed)
[facility]                             PULMONARY OFFICE NOTE   Elizabeth Mcconnell                 MRN:          [id number]  DATE:04/16/2006                            DOB:          Mar 19, 1945    [name] returns today in follow up.  This is a 65 year old white  female with a history of moderate persistent asthma with significant  atopic features, very high positive IgE level.   CURRENT MEDICATIONS:  1. Advair 250/50 one spray b.i.d.  2. Singulair 10 mg daily.  3. Nasonex p.r.n.  4. Xopenex p.r.n.   She has been coughing up more green mucus recently with increased  postnasal drainage.   PHYSICAL EXAMINATION:  VITAL SIGNS:  Temperature 97, blood pressure  148/90, pulse 81, saturation 97% on room air.  CHEST:  Distant breath sounds without evidence of wheeze or rhonchi.  CARDIAC:  Regular rate and rhythm without S3.  Normal S1 and S2.  ABDOMEN:  Soft, nontender.  EXTREMITIES:  No edema or clubbing.  SKIN:  Clear.   IMPRESSION:  Mild acute bronchitis with mild asthma flare.   PLAN:  The patient received doxycycline 100 mg b.i.d. for 5 days.  She  will receive Magic mouthwash for oral candidiasis.  She will continue  Advair and renew Nasonex, and will see the patient back in follow up in  4 months.     Charlcie Cradle Delford Field, MD, [facility]  Electronically Signed    PEW/MedQ  DD: 04/16/2006  DT: 04/16/2006  Job #: [id number]   cc:   Erskine Speed, M.D.

## 2010-07-20 NOTE — Assessment & Plan Note (Signed)
[facility]                               PULMONARY OFFICE NOTE   Elizabeth Mcconnell                 MRN:          [id number]  DATE:12/30/2005                            DOB:          01/22/1946    [name] is a 65 year old white female with history of severe atopic  asthma with extremely high Ig levels in the past.  She also has history of  atrial fibrillation.  The patient's maintains Nasonex 1 spray daily, Advair  250/50 1 spray b.i.d., Singulair 10 mg daily.   PHYSICAL EXAMINATION:  VITAL SIGNS:  Temperature 98, blood pressure 106/74,  pulse 80, saturation 96% on room air.  CHEST:  Distant breath sounds without evidence of wheeze or rhonchi.  CARDIAC:  Regular rate and rhythm without S3.  Normal S1 and S2.  ABDOMEN:  Soft, nontender.  EXTREMITIES:  No edema or clubbing.  SKIN:  Clear.  NEUROLOGIC:  Intact.  NECK:  Supple.  No jugular venous distension, no lymphadenopathy.  HEENT:  Oropharynx clear.   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   PLAN:  Maintain Advair and Nasonex as currently doses, and we will see this  patient back in return followup in 4 months.     Charlcie Cradle Delford Field, MD, [facility]    PEW/MedQ  DD: 12/30/2005  DT: 12/31/2005  Job #: [id number]   cc:   Erskine Speed, M.D.

## 2010-07-20 NOTE — Discharge Summary (Signed)
[name]             ACCOUNT NO.:  [id number]   MEDICAL RECORD NO.:  [id number]          PATIENT TYPE:  INP   LOCATION:  1511                         FACILITY:  [facility]   PHYSICIAN:  Charlcie Cradle. Delford Field, MD, FCCPDATE OF BIRTH:  1946-02-24   DATE OF ADMISSION:  03/02/2008  DATE OF DISCHARGE:  03/04/2008                               DISCHARGE SUMMARY   DISCHARGE DIAGNOSIS:  Acute exacerbation of asthma secondary to  sinusitis.   LAB DATA:  WBC is 17.6, hemoglobin 13.7, hematocrit 40.5, platelets are  157.  D-dimer is 0.31.  Sodium 141, potassium 3.6, chloride 104, CO2 is  24, glucose is 122, BUN is 11, creatinine 0.79.  Total protein 6.9,  albumin 3.6, AST 28, ALT is 85, total bilirubin is 0.5.  B-type  natriuretic peptide is 74.8.  Blood cultures were negative.  Legionella  urine was negative.  Strep viridans negative.  Myoglobin was 88.1, CK-MB  was 2.1, troponin I is less than 0.05.  A 12-lead EKG shows sinus  tachycardia with rate of 102 beats per minute.   HOSPITAL COURSE BY DISCHARGE DIAGNOSIS:  Acute exacerbation of asthma  with underlying sinusitis and oral candidiasis:  She was admitted for  further evaluation and treatment.  Treated with IV antibiotics and  nebulized bronchodilators and steroids.  She reached maximal hospital  benefit by March 04, 2008 and was ready for discharge home.   DISCHARGE MEDICATIONS:  1. She is on prednisone 40 mg for 4 days, 30 mg for 4 days, 20 mg for      4 days and then stop.  2. Avelox 400 mg daily until __________ completed.  3. Nasonex 2 puffs b.i.d.  4. Singulair 10 mg daily.  5. Cardizem 1 daily.  6. Advair 500/50 one puff 2 times daily.   DIET:  Low-sodium diet.   She has a follow-up appointment with Tammy Parrett.   DISPOSITION/CONDITION ON DISCHARGE:  Improved.   HPI:  This 65 year old female was in her usual state of health until  approximately February 25, 2008.  She developed generalized malaise,  aches and  pains.  On Christmas Day she developed generalized body aches  followed  on the 26th when she began to have nasal congestion, postnasal drip, and  epistaxis.  She continued to worsen and presented to the hospital for  further evaluation and treatment.  Of note, she has recently lost her  husband 2 weeks prior and is a widow.  Again, she was discharged in  improved condition.      [name], MSN, ACNP      Charlcie Cradle. Delford Field, MD, [facility]  Electronically Signed    SM/MEDQ  D:  03/22/2008  T:  03/22/2008  Job:  [id number]

## 2010-07-20 NOTE — Discharge Summary (Signed)
[name]             ACCOUNT NO.:  [id number]   MEDICAL RECORD NO.:  [id number]          PATIENT TYPE:  INP   LOCATION:  1430                         FACILITY:  [facility]   PHYSICIAN:  Hillery Aldo, M.D.   DATE OF BIRTH:  06/20/1945   DATE OF ADMISSION:  10/19/2004  DATE OF DISCHARGE:  10/30/2004                                 DISCHARGE SUMMARY   PRIMARY CARE PHYSICIAN:  Erskine Speed, M.D.   CARDIOLOGIST:  Jesse Sans. Wall, M.D.   DISCHARGE DIAGNOSES:  1.  Status asthmaticus.  2.  Paroxysmal atrial fibrillation.  3.  Tobacco abuse.  4.  Anxiety.  5.  Allergic rhinitis.  6.  Oral candidiasis.   DISCHARGE MEDICATIONS:  1.  Singulair 10 mg daily.  2.  Premarin 0.625 mg daily.  3.  Advair 250/50 one puff b.i.d.  4.  Nasonex 50 mcg spray in each nostril b.i.d.  5.  Avelox 400 mg daily x2 more days.  6.  Cardizem CD 360 mg daily.  7.  Coumadin 5 mg daily, to be adjusted by [facility] based on INR.      Has follow up appointment scheduled for October 31, 2004.  8.  Prednisone taper.  9.  Xopenex metered-dose inhaler two puffs q.4-6 h p.r.n. and nebulizer      treatments if not effective.  10. Spiriva one inhalation daily.  11. Atrovent 0.5 mg nebulizer q.6h. p.r.n. if Spiriva is not effective.  12. Flecainide 50 mg p.o. q.12 h.  13. Nystatin one teaspoon Swish and Swallow q.i.d.   CONSULTATIONS:  Dr. Daleen Squibb of [facility].   PROCEDURES/DIAGNOSTIC STUDIES:  1.  Chest x-ray on October 18, 2004, showed linear atelectasis at the right      lung base and apparent scarring in the left upper lobe and bilateral      apical pleural thickening.  2.  Chest x-ray on October 19, 2004, showed no acute abnormality and stable      peribronchial thickening and bi-apical pleural parenchymal scarring.  3.  Chest x-ray on October 25, 2004, showed stable chronic lung changes.  4.  A 2D echocardiogram on October 22, 2004, showed normal left ventricular      systolic function  with an ejection fraction of 55-65%.  There were no      left ventricular regional wall motion abnormalities.  The left      ventricular wall thickness was at the upper limits of normal.   DISCHARGE LABORATORIES:  PT was 20.5, INR 1.7.  Magnesium was 2.4.  Sodium  139, potassium 3.6, chloride 99, bicarbonate 29, glucose 95, BUN 11,  creatinine 0.8, calcium 8.9.   BRIEF HISTORY OF PRESENT ILLNESS:  The patient is a 65 year old female with  long-standing asthma who developed increasing shortness of breath.  She  visited the emergency department on two occasions and was sent home on  prednisone taper and antibiotics.  She was admitted for further management  when she represented to the emergency room with no relief of her symptoms.   HOSPITAL COURSE:  #1 - STATUS ASTHMATICUS.  The patient was admitted  and  started on high dose IV Solu-Medrol.  She had gradual clinical improvement  with steroids, in addition to, nebulizer therapy with Albuterol and  Atrovent.  Her Albuterol was changed to Xopenex secondary to tachycardia  problems.  She was empirically treated with a full course of Avelox for any  underlying bronchitis or pneumonia.  She will be sent home on a slow steroid  taper.  Additionally, she was started on Zyrtec for her allergic rhinitis.  She will be monitored closely as an outpatient, and was instructed to check  her pre- and post-peak flows and to contact her physician if these show any  change from her baseline peak flows.   #2 - PAROXYSMAL ATRIAL FIBRILLATION.  The patient did develop paroxysmal  atrial fibrillation with rapid ventricular response on hospital day #2.  Cardiology consultation was obtained.  She was placed on Cardizem for rate  control.  She was also started on Lovenox which was later changed to a  heparin drip and Coumadin for chronic anticoagulation.  She had several  other episodes of paroxysmal atrial fibrillation with her breathing  treatments.  She was,  therefore, also started on flecainide by Cardiology.  She has outpatient follow up already scheduled with the cardiologist, and  their plan is to do an outpatient Cardiolite.   #3 - TOBACCO ABUSE.  The patient was concealed about her tobacco habit.   #4 - ALLERGIC RHINITIS.  The patient was given Zyrtec and Nasonex for  control.   #5 - QUESTIONABLE HYPERTENSION BASED ON HER LEFT VENTRICULAR WALL BEING AT  THE UPPER LIMITS OF NORMAL.  The patient was started on Cardizem, and her  blood pressure was well controlled throughout the course of her  hospitalization.   DISPOSITION:  The patient will be discharged home.  [facility] will  arrange to have a nebulizer machine brought to the patient's home.  She is  to use this if the metered-dose inhaler, Xopenex and Spiriva are ineffective  in controlling her symptoms.  She understands how to check her peak flows  and will do so prior to her breathing treatments.  She has follow up  scheduled with Dr. Daleen Squibb on September 15 at 11:15 a.m. and a  Coumadin/PT/INR check at 10:30 a.m. on October 31, 2004.  She was instructed  to contact Dr. Chilton Si and arrange for follow up in the next 1-2 weeks.  Additionally, she has a stress test scheduled for 11:45 a.m. on August, 30,  2006.  She was instructed to increase her activity slowly and to resume a  normal diet.           ______________________________  Hillery Aldo, M.D.     CR/MEDQ  D:  10/30/2004  T:  10/30/2004  Job:  [id number]   cc:   Erskine Speed, M.D.  [address]  [place]  [place] [address]  Fax: [phone]   Jesse Sans. Wall, M.D.  [address]  [place]  [place] [address]

## 2010-07-20 NOTE — Assessment & Plan Note (Signed)
[facility]                                   ON-CALL NOTE   Audelia Acton                 MRN:          [id number]  DATE:01/26/2006                            DOB:          07-27-1945    [name] is a patient followed by Charlcie Cradle. Delford Field, MD, at [facility] for asthma.  She has been doing well and her maintenance regimen  is Advair twice a day.  She rarely requires extra albuterol.  Unfortunately,  she has caught an upper respiratory infection and is having daily cough with  increased sputum production.  She is also wheezing with exertion and  sometimes at rest.  She has been instructed to call under such circumstances  to consider starting antibiotic therapy to head off an exacerbation of her  asthma.  I called in Avelox 400 mg daily for 7 days.  She will start this  today and will call our office to follow up on Monday.  If she has any  worsening, she will call me back or go seek immediate treatment at the  emergency department.     Leslye Peer, MD  Electronically Signed    RSB/MedQ  DD: 01/27/2006  DT: 01/27/2006  Job #: [id number]

## 2010-07-20 NOTE — H&P (Signed)
[name]             ACCOUNT NO.:  [id number]   MEDICAL RECORD NO.:  [id number]          PATIENT TYPE:  EMS   LOCATION:  ED                           FACILITY:  [facility]   PHYSICIAN:  Lonia Blood, M.D.      DATE OF BIRTH:  03/25/1945   DATE OF ADMISSION:  10/19/2004  DATE OF DISCHARGE:                                HISTORY & PHYSICAL   PRIMARY CARE PHYSICIAN:  Erskine Speed, M.D.   CHIEF COMPLAINT:  Shortness of breath.   HISTORY OF PRESENT ILLNESS:  The patient is a 65 year old with longstanding  asthma but has not had attack. Her asthma has been mild intermittent, her  last attack was in December 2005. The patient started having upper  respiratory infection at the same time on Monday with some sore throat,  stuffy nose. Subsequently she started having shortness of breath and has  been wheezing since then. She has been in the ED at least twice since  Monday. She has had prednisone, also antibiotics, Zithromax and continues to  take Albuterol inhaler but no relief. She was here also last night in the ER  where she was given some nebulizers and given prednisone taper. Again her  shortness of breath has continued to worsen, hence she came into the  emergency room today.   PAST MEDICAL HISTORY:  Mainly asthma and the patient is post menopausal.   MEDICATIONS:  Singulair 10 mg daily, Premarin 0.625 mg daily.   SOCIAL HISTORY:  The patient is married and lives with her husband here in  [place]. One of her two children also live with her and he has a cat. The  patient smokes about half a pack per day and she has done that for more than  30 years. Denied any alcohol in years.   FAMILY HISTORY:  Both parents are deceased. Father died in his 40s from  massive stroke. Mother died at the age of 16. She had diabetes at late age.  The patient's grandfather had asthma. Her two children are essentially  normal, no known illnesses.   REVIEW OF SYMPTOMS:  A 10 point review of  systems is essentially as in HPI.   PHYSICAL EXAMINATION:  VITAL SIGNS:  Temperature is 99.1, her blood pressure  is 146/83, pulse 105, respiratory rate 28, saturations 90% on room air.  GENERAL:  The patient is alert and oriented with use of muscles of  respiration but able to talk in complete sentences.  HEENT:  PERRL. EOMI. Mucous membranes are moist.  NECK:  Supple, no JVD, no lymphadenopathy.  RESPIRATORY:  The patient has profound inspiratory and expiratory wheezing.  Air movement is poor.  CARDIOVASCULAR:  She is tachycardic.  ABDOMEN:  Soft, nontender with positive bowel sounds.  EXTREMITIES:  No edema, cyanosis or clubbing.   LABORATORY DATA:  Currently pending. Chest x-ray also pending. EKG also  pending.   ASSESSMENT:  This is 65 year old female with known history of asthma  currently in status asthmaticus probably triggered by upper respiratory  infection. The infection is likely to be viral in nature which has since  seemed to have resolved. However, the patient is still wheezing and not  moving air. The fact that she failed outpatient therapy means she will need  to be treated as an inpatient. Will therefore admit the patient and do the  following:   PLAN:  1.  Start her on IV steroids.  2.  Schedule nebulizers q.4 h mainly with albuterol and Ativan.  3.  Start her on Advair so she could get an inhaled steroid that she needs      to be taking as an outpatient. I will continue with the Singulair as      well.  4.  I will check baseline labs on the patient and make sure there is nothing      else that is trigger her attack. I will also check a chest x-ray and      rule out pneumonia although the patient seems to be afebrile but only      mild cough which could be related to the asthma attack. I will also      continue with her Premarin while in the hospital and because of that      also put her on some DVT prophylaxis using Lovenox. I will keep her      saturations  between 90 and 93% with oxygen while in the hospital. Once      the patient is stable, I will convert her to oral prednisone and then      taper it off at home.      Lonia Blood, M.D.  Electronically Signed     LG/MEDQ  D:  10/19/2004  T:  10/19/2004  Job:  [id number]   cc:   Erskine Speed, M.D.  [address] [place]  [place]  [place] [address]  Fax: [phone]

## 2010-07-20 NOTE — Op Note (Signed)
NAME:  Elizabeth Mcconnell                    ACCOUNT NO.:  [id number]   MEDICAL RECORD NO.:  [id number]                   PATIENT TYPE:  AMB   LOCATION:  ENDO                                 FACILITY:  [facility]   PHYSICIAN:  Danise Edge, M.D.                DATE OF BIRTH:  1945/12/31   DATE OF PROCEDURE:  05/12/2003  DATE OF DISCHARGE:                                 OPERATIVE REPORT   PROCEDURE:  Screening colonoscopy.   INDICATIONS:  Mrs. Fredirick Maudlin is a 65 year old female, born 08-Jun-1945.  Mrs. Rinaldo Ratel is scheduled to undergo a her first screening  colonoscopy with polypectomy to prevent colon cancer.   ENDOSCOPIST:  Danise Edge, M.D.   PREMEDICATION:  Versed 10 mg, Demerol 50 mg.   DESCRIPTION OF PROCEDURE:  After obtaining informed consent, Mrs. Rinaldo Ratel  was placed in the left lateral decubitus position.  I administered  intravenous Demerol and intravenous Versed to achieve conscious sedation for  the procedure.  The patient's blood pressure, oxygen saturation and cardiac  rhythm were monitored throughout the procedure and documented in the medical  record.   Anal inspection was normal.  Digital rectal exam was normal.  The Olympus  adjustable pediatric  colonoscope was introduced into the rectum and  advanced to the cecum.  Colonic preparation for the exam today was  excellent.  Rectum:  Normal.  Sigmoid colon and descending colon:  Left colonic diverticulosis.  Splenic flexure:  Normal.  Transverse colon:  Normal.  Hepatic flexure:  Normal.  Ascending colon:  Normal.  Cecum and ileocecal valve:  Normal.   ASSESSMENT:  1. Normal proctocolonoscopy to the cecum.  No endoscopic evidence for the     presence of colorectal  neoplasia.  2. Left colonic diverticulosis.  3. Mrs. Rinaldo Ratel may have sleep apnea syndrome.  After receiving conscious     sedation and placed in the supine position, she developed snoring and     oxygen desaturation.  When  maintained in the left lateral decubitus     position, her snoring diminishes and her oxygen saturation normalizes.                                               Danise Edge, M.D.    MJ/MEDQ  D:  05/12/2003  T:  05/12/2003  Job:  [id number]   cc:   Reuel Boom L. Eda Paschal, M.D.  [address]  [place]  [place] [address]  Fax: [phone]   Erskine Speed, M.D.  [address]  [place]  [place] [address]  Fax: [phone]

## 2010-09-01 ENCOUNTER — Other Ambulatory Visit: Payer: Self-pay | Admitting: Critical Care Medicine

## 2010-09-10 ENCOUNTER — Other Ambulatory Visit: Payer: Self-pay | Admitting: Critical Care Medicine

## 2010-09-18 ENCOUNTER — Other Ambulatory Visit: Payer: Self-pay | Admitting: Cardiology

## 2010-09-24 ENCOUNTER — Telehealth: Payer: Self-pay | Admitting: Critical Care Medicine

## 2010-09-24 MED ORDER — AMOXICILLIN-POT CLAVULANATE 875-125 MG PO TABS: 1.0000 | ORAL_TABLET | Freq: Two times a day (BID) | ORAL | Status: DC

## 2010-09-24 NOTE — Telephone Encounter (Signed)
Call in augmentin 875 mg bid  Generic ok  X 7 days OV when she returns for recheck

## 2010-09-24 NOTE — Telephone Encounter (Signed)
Called spoke with patient who reports prod cough with green/gray mucus, chest congestion, tightness in chest, decreased energy x3days.  Patient denies wheezing, SOB, f/c/s and she is reportedly going out of town on Wednesday.  Requesting abx.  [facility].   Dr Delford Field please advise, thanks!  Allergies  Allergen Reactions  . Moxifloxacin     REACTION: rash

## 2010-09-24 NOTE — Telephone Encounter (Signed)
Spoke with pt and notified of recs per PW. Pt verbalized understanding. Rx was sent to pharm and rov sched for 8.1.12 at 4:15 pm

## 2010-10-03 ENCOUNTER — Ambulatory Visit (INDEPENDENT_AMBULATORY_CARE_PROVIDER_SITE_OTHER): Payer: Medicare Other | Admitting: Critical Care Medicine

## 2010-10-03 ENCOUNTER — Encounter: Payer: Self-pay | Admitting: Critical Care Medicine

## 2010-10-03 VITALS — BP 126/80 | HR 70 | Temp 98.8°F | Ht 65.0 in | Wt 134.8 lb

## 2010-10-03 DIAGNOSIS — J45909 Unspecified asthma, uncomplicated: Secondary | ICD-10-CM

## 2010-10-03 MED ORDER — MONTELUKAST SODIUM 10 MG PO TABS: 10.0000 mg | ORAL_TABLET | Freq: Every day | ORAL | Status: DC

## 2010-10-03 NOTE — Patient Instructions (Signed)
No change in medications Return  6 months  Call if symptoms worsen or recur

## 2010-10-03 NOTE — Progress Notes (Signed)
Subjective:    Patient ID: Elizabeth Mcconnell, female    DOB: 11/19/1945, 65 y.o.   MRN: [id number]  HPI 65 year old white female with a history of moderate persistent asthma with significant atopic features, very high positive IgE level.  May 02, 2009 2:27 PM  Annual f/u. No major problems over the past year  Pt denies any significant sore throat, nasal congestion or excess secretions, fever, chills, sweats, unintended weight loss, pleurtic or exertional chest pain, orthopnea PND, or leg swelling  Pt denies any increase in rescue therapy over baseline, denies waking up needing it or having any early am or nocturnal exacerbations of coughing/wheezing/or dyspnea.  October 06, 2009 9:58 AM  no real change since march. rx amoxicillin after zpak per pcp green in 6/11. Still febrile after zpak and better after amoxicillin. Had sinus and throat issues and prod cough. Now is better. Felt bad all over for several days. Ok since. Now no cough, no wheeze  November 27, 2009 3:53 PM  Ok until one week ago with sinus drainage and then coughing.  Was on amoxicil. got worse over the past three days. No making progress. Pt is tight in chest and dyspneic. Feels lethargic Mucus is white to brown mucus.  December 25, 2009 4:28 PM  took pred and augmentin and was hyper but is better.  Now some cough but is dry. No dyspnea now. No SABA use.  Overall doing better.  Pt denies any significant sore throat, nasal congestion or excess secretions, fever, chills, sweats, unintended weight loss, pleurtic or exertional chest pain, orthopnea PND, or leg swelling  Pt denies any increase in rescue therapy over baseline, denies waking up needing it or having any early am or nocturnal exacerbations of coughing/wheezing/or dyspnea.   10/03/2010 Pt was ill, rx augmentin, took one dose and pt became ill and had emesis.    Since that time, has mild cough not as bad as before, mucus now is clear to sl yellow Asthma History:     Symptoms  0-2 days/week         Nighttime Awakenings  0-2/month         Asthma interference with normal activity  No limitations         SABA use (not for EIB)  0-2 days/wk         Risk: Exacerbations requiring oral systemic steroids  0-1 / year             Review of Systems     Objective:   Physical Exam  Filed Vitals:   10/03/10 1613  BP: 126/80  Pulse: 70  Temp: 98.8 F (37.1 C)  TempSrc: Oral  Height: 5\' 5"  (1.651 m)  Weight: 134 lb 12.8 oz (61.145 kg)  SpO2: 97%    Gen: Pleasant, well-nourished, in no distress,  normal affect  ENT: No lesions,  mouth clear,  oropharynx clear, no postnasal drip  Neck: No JVD, no TMG, no carotid bruits  Lungs: No use of accessory muscles, no dullness to percussion, clear without rales or rhonchi  Cardiovascular: RRR, heart sounds normal, no murmur or gallops, no peripheral edema  Abdomen: soft and NT, no HSM,  BS normal  Musculoskeletal: No deformities, no cyanosis or clubbing  Neuro: alert, non focal  Skin: Warm, no lesions or rashes       Assessment & Plan:   ASTHMA Moderate persistent asthma improved Recent exacerbation better Plan No change in medications. Return in  6months    Updated Medication List Outpatient Encounter Prescriptions as of 10/03/2010  Medication Sig Dispense Refill  . ADVAIR DISKUS 250-50 MCG/DOSE AEPB INHALE ONE (1) PUFFS TWICE DAILY  60 each  1  . aspirin 81 MG tablet Take 81 mg by mouth daily.        . Calcium Carbonate-Vitamin D (CALCIUM-VITAMIN D) 500-200 MG-UNIT per tablet Take 1 tablet by mouth daily.        [name] CARTIA XT 300 MG 24 hr capsule TAKE ONE CAPSULE BY MOUTH ONE TIME DAILY  30 capsule  0  . estradiol (ESTRACE) 1 MG tablet Take 1 mg by mouth daily.        . fluticasone (FLONASE) 50 MCG/ACT nasal spray INHALE 2 SPRAYS INTO EACH NOSTRIL ONCE DAILY  16 g  1  . levalbuterol (XOPENEX HFA) 45 MCG/ACT inhaler Inhale 1-2 puffs using inhaler every four hours as needed put on  hold, pt does not yet need       . montelukast (SINGULAIR) 10 MG tablet Take 1 tablet (10 mg total) by mouth daily.  30 tablet  6  . DISCONTD: amoxicillin-clavulanate (AUGMENTIN) 875-125 MG per tablet Take 1 tablet by mouth 2 (two) times daily.  14 tablet  0  . DISCONTD: montelukast (SINGULAIR) 10 MG tablet Take 10 mg by mouth daily.

## 2010-10-04 NOTE — Assessment & Plan Note (Signed)
Moderate persistent asthma improved Recent exacerbation better Plan No change in medications. Return in           6months

## 2010-10-30 ENCOUNTER — Other Ambulatory Visit: Payer: Self-pay | Admitting: Cardiology

## 2010-11-07 ENCOUNTER — Other Ambulatory Visit: Payer: Self-pay | Admitting: Critical Care Medicine

## 2010-11-21 ENCOUNTER — Other Ambulatory Visit: Payer: Self-pay | Admitting: Critical Care Medicine

## 2010-12-07 LAB — CBC
HCT: 41.6 % (ref 36.0–46.0)
HCT: 41.7 % (ref 36.0–46.0)
Hemoglobin: 14.1 g/dL (ref 12.0–15.0)
Hemoglobin: 14.1 g/dL (ref 12.0–15.0)
MCHC: 33.8 g/dL (ref 30.0–36.0)
MCHC: 33.9 g/dL (ref 30.0–36.0)
MCV: 85.5 fL (ref 78.0–100.0)
MCV: 85.9 fL (ref 78.0–100.0)
Platelets: 152 10*3/uL (ref 150–400)
Platelets: 170 10*3/uL (ref 150–400)
RBC: 4.84 MIL/uL (ref 3.87–5.11)
RBC: 4.88 MIL/uL (ref 3.87–5.11)
RDW: 14.2 % (ref 11.5–15.5)
RDW: 14.4 % (ref 11.5–15.5)
WBC: 12.5 10*3/uL — ABNORMAL HIGH (ref 4.0–10.5)
WBC: 15 10*3/uL — ABNORMAL HIGH (ref 4.0–10.5)

## 2010-12-07 LAB — CULTURE, BLOOD (ROUTINE X 2)
Culture: NO GROWTH
Culture: NO GROWTH

## 2010-12-07 LAB — POCT CARDIAC MARKERS
CKMB, poc: 1.7 ng/mL (ref 1.0–8.0)
CKMB, poc: 2.1 ng/mL (ref 1.0–8.0)
Myoglobin, poc: 88.1 ng/mL (ref 12–200)
Myoglobin, poc: 99.3 ng/mL (ref 12–200)
Troponin i, poc: <0.05 ng/mL (ref 0.00–0.09)
Troponin i, poc: <0.05 ng/mL (ref 0.00–0.09)

## 2010-12-07 LAB — LEGIONELLA ANTIGEN, URINE: Legionella Antigen, Urine: NEGATIVE

## 2010-12-07 LAB — URINALYSIS, ROUTINE W REFLEX MICROSCOPIC
Bilirubin Urine: NEGATIVE
Glucose, UA: NEGATIVE mg/dL
Hgb urine dipstick: NEGATIVE
Ketones, ur: NEGATIVE mg/dL
Nitrite: NEGATIVE
Protein, ur: NEGATIVE mg/dL
Specific Gravity, Urine: 1.014 (ref 1.005–1.030)
Urobilinogen, UA: 0.2 mg/dL (ref 0.0–1.0)
pH: 6 (ref 5.0–8.0)

## 2010-12-07 LAB — COMPREHENSIVE METABOLIC PANEL
ALT: 24 U/L (ref 0–35)
ALT: 26 U/L (ref 0–35)
AST: 28 U/L (ref 0–37)
AST: 28 U/L (ref 0–37)
Albumin: 3.6 g/dL (ref 3.5–5.2)
Albumin: 4 g/dL (ref 3.5–5.2)
Alkaline Phosphatase: 85 U/L (ref 39–117)
Alkaline Phosphatase: 97 U/L (ref 39–117)
BUN: 6 mg/dL (ref 6–23)
BUN: 7 mg/dL (ref 6–23)
CO2: 22 mEq/L (ref 19–32)
CO2: 25 mEq/L (ref 19–32)
Calcium: 9.2 mg/dL (ref 8.4–10.5)
Calcium: 9.4 mg/dL (ref 8.4–10.5)
Chloride: 105 mEq/L (ref 96–112)
Chloride: 105 mEq/L (ref 96–112)
Creatinine, Ser: 0.71 mg/dL (ref 0.4–1.2)
Creatinine, Ser: 0.74 mg/dL (ref 0.4–1.2)
GFR calc Af Amer: >60 mL/min (ref >60)
GFR calc Af Amer: >60 mL/min (ref >60)
GFR calc non Af Amer: >60 mL/min (ref >60)
GFR calc non Af Amer: >60 mL/min (ref >60)
Glucose, Bld: 120 mg/dL — ABNORMAL HIGH (ref 70–99)
Glucose, Bld: 143 mg/dL — ABNORMAL HIGH (ref 70–99)
Potassium: 3.6 mEq/L (ref 3.5–5.1)
Potassium: 4 mEq/L (ref 3.5–5.1)
Sodium: 139 mEq/L (ref 135–145)
Sodium: 139 mEq/L (ref 135–145)
Total Bilirubin: 0.5 mg/dL (ref 0.3–1.2)
Total Bilirubin: 0.7 mg/dL (ref 0.3–1.2)
Total Protein: 6.9 g/dL (ref 6.0–8.3)
Total Protein: 7.3 g/dL (ref 6.0–8.3)

## 2010-12-07 LAB — B-NATRIURETIC PEPTIDE (CONVERTED LAB): Pro B Natriuretic peptide (BNP): 74.8 pg/mL (ref 0.0–100.0)

## 2010-12-07 LAB — DIFFERENTIAL
Basophils Absolute: 0 10*3/uL (ref 0.0–0.1)
Basophils Relative: 0 % (ref 0–1)
Eosinophils Absolute: 0 10*3/uL (ref 0.0–0.7)
Eosinophils Relative: 0 % (ref 0–5)
Lymphocytes Relative: 4 % — ABNORMAL LOW (ref 12–46)
Lymphs Abs: 0.6 10*3/uL — ABNORMAL LOW (ref 0.7–4.0)
Monocytes Absolute: 0.5 10*3/uL (ref 0.1–1.0)
Monocytes Relative: 4 % (ref 3–12)
Neutro Abs: 13.9 10*3/uL — ABNORMAL HIGH (ref 1.7–7.7)
Neutrophils Relative %: 92 % — ABNORMAL HIGH (ref 43–77)

## 2010-12-07 LAB — D-DIMER, QUANTITATIVE: D-Dimer, Quant: 0.31 ug/mL-FEU (ref 0.00–0.48)

## 2010-12-07 LAB — STREP PNEUMONIAE URINARY ANTIGEN: Strep Pneumo Urinary Antigen: NEGATIVE

## 2010-12-12 ENCOUNTER — Telehealth: Payer: Self-pay | Admitting: Critical Care Medicine

## 2010-12-12 MED ORDER — DOXYCYCLINE HYCLATE 100 MG PO TABS: ORAL_TABLET | ORAL | Status: DC

## 2010-12-12 NOTE — Telephone Encounter (Signed)
We need to ask her if she is coughing up mucus from chest or out of nose?  Is it purulent?

## 2010-12-12 NOTE — Telephone Encounter (Signed)
Pt is checking on status & would like to pick up meds before it gets much later.  Pt can be reached at [phone].  Elizabeth Mcconnell

## 2010-12-12 NOTE — Telephone Encounter (Signed)
Spoke with pt. She states that her cough is prod with very minimal white to light yellow sputum. She has had some nasal d/c that is clear in color. Will forward to RB as KC has left for the day. Please advise, thanks!

## 2010-12-12 NOTE — Telephone Encounter (Signed)
Per CY-okay to give Doxycycline 100mg  #8 take 2 today then 1 daily until gone no refills.    Pt is aware and Rx sent.

## 2010-12-12 NOTE — Telephone Encounter (Signed)
I spoke with pt and she c/o head congestion, sinus pressure, sinus headache, a little dry cough, drainage down the back of her throat, runny nose, feels achy, feels tired, chills x yesterday. Pt states she doesn't believe she has had a fever. Offered OV but pt states she would prefer if something was called in for her instead. Pt states she has been taking mucinex and tylenol. Will forward to doc of the day for recs. Please advise Dr. Shelle Iron, thanks  Allergies  Allergen Reactions  . Augmentin     Emesis, nausea  . Moxifloxacin     REACTION: rash    Carver Fila, CMA

## 2010-12-12 NOTE — Telephone Encounter (Signed)
Called, spoke with pt. I advised her KC is still seeing pts but we will call her once this has been addressed.  Also advised I will inform his nurse she is wanting this called in asap so she can go get it.  Dr. Shelle Iron, pls advise. Thanks!

## 2011-01-07 ENCOUNTER — Telehealth: Payer: Self-pay | Admitting: Critical Care Medicine

## 2011-01-07 NOTE — Telephone Encounter (Addendum)
Pt was actually calling for a PA for generic Singulair. I called the number given and initiated PA, but was told that the medication did not need a PA. The pt picked up the medication on 12-27-10. I then called customer service to see if it was a copay issue at [phone].  The rep states that the pt was getting the medication at $12 per month because she had a PA on file. She states the medication does need

## 2011-02-21 ENCOUNTER — Telehealth: Payer: Self-pay | Admitting: Critical Care Medicine

## 2011-02-21 MED ORDER — DOXYCYCLINE HYCLATE 100 MG PO TABS: 100.0000 mg | ORAL_TABLET | Freq: Two times a day (BID) | ORAL | Status: AC

## 2011-02-21 NOTE — Telephone Encounter (Signed)
Spoke with pt. She states has had sinus pressure "burning feeling in sinuses" and PND x 2 days. She started coughing the am, and was able to produce small amount of yellow sputum. She states that she has "just not felt well" for a few days. Has some tightness in her chest. Would like rx for doxycycline called in. Was offered appt but declined. Please advise, thanks! Allergies  Allergen Reactions  . Augmentin     Emesis, nausea  . Moxifloxacin     REACTION: rash

## 2011-02-21 NOTE — Telephone Encounter (Signed)
Rx was sent to pharm and spoke with pt and notified that this was done.

## 2011-02-21 NOTE — Telephone Encounter (Signed)
Ok for doxycycline 100mg bid x 7 days  

## 2011-04-01 ENCOUNTER — Other Ambulatory Visit: Payer: Self-pay | Admitting: Internal Medicine

## 2011-04-01 ENCOUNTER — Ambulatory Visit
Admission: RE | Admit: 2011-04-01 | Discharge: 2011-04-01 | Disposition: A | Discharge status: Home / Self Care | Payer: Medicare Other | Source: Ambulatory Visit | Attending: Internal Medicine | Admitting: Internal Medicine

## 2011-04-01 DIAGNOSIS — R52 Pain, unspecified: Secondary | ICD-10-CM

## 2011-04-02 ENCOUNTER — Ambulatory Visit (INDEPENDENT_AMBULATORY_CARE_PROVIDER_SITE_OTHER): Payer: Medicare Other | Admitting: Critical Care Medicine

## 2011-04-02 ENCOUNTER — Encounter: Payer: Self-pay | Admitting: Critical Care Medicine

## 2011-04-02 VITALS — BP 122/72 | HR 83 | Temp 98.3°F | Ht 65.0 in | Wt 137.4 lb

## 2011-04-02 DIAGNOSIS — J45909 Unspecified asthma, uncomplicated: Secondary | ICD-10-CM

## 2011-04-02 MED ORDER — MONTELUKAST SODIUM 10 MG PO TABS: 10.0000 mg | ORAL_TABLET | Freq: Every day | ORAL | Status: DC

## 2011-04-02 MED ORDER — FLUTICASONE-SALMETEROL 250-50 MCG/DOSE IN AEPB: 1.0000 | INHALATION_SPRAY | Freq: Two times a day (BID) | RESPIRATORY_TRACT | Status: DC

## 2011-04-02 MED ORDER — FLUTICASONE PROPIONATE 50 MCG/ACT NA SUSP: 2.0000 | Freq: Every day | NASAL | Status: DC

## 2011-04-02 NOTE — Progress Notes (Signed)
Subjective:    Patient ID: Elizabeth Mcconnell, female    DOB: [birth year], 66 y.o.   MRN: [id number]  Asthma Her past medical history is significant for asthma.   66 y.o.    white female with a history of moderate persistent asthma with significant atopic features, very high positive IgE level.    04/03/2011 Doing well, took doxy over christmas else ok. Pt denies any significant sore throat, nasal congestion or excess secretions, fever, chills, sweats, unintended weight loss, pleurtic or exertional chest pain, orthopnea PND, or leg swelling Pt denies any increase in rescue therapy over baseline, denies waking up needing it or having any early am or nocturnal exacerbations of coughing/wheezing/or dyspnea. Pt also denies any obvious fluctuation in symptoms with  weather or environmental change or other alleviating or aggravating factors   PUL ASTHMA HISTORY 04/03/2011 10/03/2010  Symptoms 0-2 days/week 0-2 days/week  Nighttime awakenings 0-2/month 0-2/month  Interference with activity No limitations No limitations  SABA use 0-2 days/wk 0-2 days/wk  Exacerbations requiring oral steroids 0-1 / year 0-1 / year     Review of Systems Constitutional:   No  weight loss, night sweats,  Fevers, chills, fatigue, lassitude. HEENT:   No headaches,  Difficulty swallowing,  Tooth/dental problems,  Sore throat,                No sneezing, itching, ear ache, nasal congestion, post nasal drip,   CV:  No chest pain,  Orthopnea, PND, swelling in lower extremities, anasarca, dizziness, palpitations  GI  No heartburn, indigestion, abdominal pain, nausea, vomiting, diarrhea, change in bowel habits, loss of appetite  Resp: No shortness of breath with exertion or at rest.  No excess mucus, no productive cough,  No non-productive cough,  No coughing up of blood.  No change in color of mucus.  No wheezing.  No chest wall deformity  Skin: no rash or lesions.  GU: no dysuria, change in color of urine, no  urgency or frequency.  No flank pain.  MS:  No joint pain or swelling.  No decreased range of motion.  No back pain.  Psych:  No change in mood or affect. No depression or anxiety.  No memory loss.      Objective:   Physical Exam   Filed Vitals:   04/02/11 1533  BP: 122/72  Pulse: 83  Temp: 98.3 F (36.8 C)  TempSrc: Oral  Height: 5\' 5"  (1.651 m)  Weight: 137 lb 6.9 oz (62.338 kg)  SpO2: 97%    Gen: Pleasant, well-nourished, in no distress,  normal affect  ENT: No lesions,  mouth clear,  oropharynx clear, no postnasal drip  Neck: No JVD, no TMG, no carotid bruits  Lungs: No use of accessory muscles, no dullness to percussion, clear without rales or rhonchi  Cardiovascular: RRR, heart sounds normal, no murmur or gallops, no peripheral edema  Abdomen: soft and NT, no HSM,  BS normal  Musculoskeletal: No deformities, no cyanosis or clubbing  Neuro: alert, non focal  Skin: Warm, no lesions or rashes       Assessment & Plan:   ASTHMA Moderate  persistent asthma with ongoing airway inflammation now well controlled Plan Maintain inhaled medications as prescribed     Updated Medication List Outpatient Encounter Prescriptions as of 04/02/2011  Medication Sig Dispense Refill  . aspirin 81 MG tablet Take 81 mg by mouth daily.        . Calcium Carbonate-Vitamin D (CALCIUM-VITAMIN D) 500-200 MG-UNIT per tablet  Take 1 tablet by mouth daily.        [name] diltiazem (CARDIZEM CD) 300 MG 24 hr capsule TAKE ONE CAPSULE BY MOUTH ONE TIME DAILY  30 capsule  6  . estradiol (ESTRACE) 1 MG tablet Take 1 mg by mouth daily.        . fluticasone (FLONASE) 50 MCG/ACT nasal spray Place 2 sprays into the nose daily.  16 g  6  . Fluticasone-Salmeterol (ADVAIR DISKUS) 250-50 MCG/DOSE AEPB Inhale 1 puff into the lungs 2 (two) times daily.  60 each  6  . levalbuterol (XOPENEX HFA) 45 MCG/ACT inhaler Inhale 1-2 puffs using inhaler every four hours as needed put on hold, pt does not yet need        . montelukast (SINGULAIR) 10 MG tablet Take 1 tablet (10 mg total) by mouth daily.  30 tablet  6  . DISCONTD: ADVAIR DISKUS 250-50 MCG/DOSE AEPB USE 1 INHALATION 2 TIMES A DAY.  60 each  6  . DISCONTD: doxycycline (VIBRA-TABS) 100 MG tablet Take 2 today then 1 daily until gone  8 tablet  0  . DISCONTD: fluticasone (FLONASE) 50 MCG/ACT nasal spray INHALE 2 SPRAYS INTO EACH NOSTRIL ONCE DAILY  16 g  6  . DISCONTD: montelukast (SINGULAIR) 10 MG tablet Take 1 tablet (10 mg total) by mouth daily.  30 tablet  6

## 2011-04-02 NOTE — Patient Instructions (Signed)
No change in medications. Return in        6 months        

## 2011-04-03 NOTE — Assessment & Plan Note (Signed)
Moderate  persistent asthma with ongoing airway inflammation now well controlled Plan Maintain inhaled medications as prescribed

## 2011-05-21 ENCOUNTER — Encounter: Payer: Self-pay | Admitting: Obstetrics and Gynecology

## 2011-06-12 ENCOUNTER — Ambulatory Visit (INDEPENDENT_AMBULATORY_CARE_PROVIDER_SITE_OTHER): Payer: Medicare Other | Admitting: Cardiology

## 2011-06-12 ENCOUNTER — Encounter: Payer: Self-pay | Admitting: Cardiology

## 2011-06-12 VITALS — BP 130/80 | HR 79 | Ht 65.0 in | Wt 138.0 lb

## 2011-06-12 DIAGNOSIS — I4891 Unspecified atrial fibrillation: Secondary | ICD-10-CM

## 2011-06-12 NOTE — Assessment & Plan Note (Signed)
Clinical recurrence. Continue conservative management.

## 2011-06-12 NOTE — Patient Instructions (Signed)
Your physician recommends that you continue on your current medications as directed. Please refer to the Current Medication list given to you today.   Your physician wants you to follow-up in: 1 year with Dr. Wall. You will receive a reminder letter in the mail two months in advance. If you don't receive a letter, please call our office to schedule the follow-up appointment.  

## 2011-06-12 NOTE — Progress Notes (Signed)
HPI [name] comes in today for evaluation and management of her history of paroxysmal A. fib.  [name] had no symptoms suggestive of A. fib since I last saw her. She denies any chest pain, dyspnea on exertion, and his lost weight and become much more active. He is planning on getting remarried. She  seems very happy today.  Past Medical History  Diagnosis Date  . Pneumothorax   . Tobacco abuse   . Allergic rhinitis   . Oral candidiasis   . Atrial fibrillation   . Asthma   . 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 aspirin 81 MG tablet Take 81 mg by mouth daily.        . Calcium Carbonate-Vitamin D (CALCIUM-VITAMIN D) 500-200 MG-UNIT per tablet Take 1 tablet by mouth daily.        . celecoxib (CELEBREX) 200 MG capsule Take 200 mg by mouth daily.      [name] diltiazem (CARDIZEM CD) 300 MG 24 hr capsule TAKE ONE CAPSULE BY MOUTH ONE TIME DAILY  30 capsule  6  . estradiol (ESTRACE) 1 MG tablet Take 1 mg by mouth daily.        . fluticasone (FLONASE) 50 MCG/ACT nasal spray Place 2 sprays into the nose daily.  16 g  6  . Fluticasone-Salmeterol (ADVAIR DISKUS) 250-50 MCG/DOSE AEPB Inhale 1 puff into the lungs 2 (two) times daily.  60 each  6  . levalbuterol (XOPENEX HFA) 45 MCG/ACT inhaler Inhale 1-2 puffs using inhaler every four hours as needed put on hold, pt does not yet need       . montelukast (SINGULAIR) 10 MG tablet Take 1 tablet (10 mg total) by mouth daily.  30 tablet  6    Allergies  Allergen Reactions  . Augmentin     Emesis, nausea  . Moxifloxacin     REACTION: rash    Family History  Problem Relation Age of Onset  . Asthma    . Stroke Father 25  . Diabetes Mother     History   Social History  . Marital Status: Married    Spouse Name: N/A    Number of Children: 2  . Years of Education: N/A   Occupational History  . [employer] Benefits Other   Social  History Main Topics  . Smoking status: Former Smoker -- 0.5 packs/day for 20 years    Quit date: 03/04/2004  . Smokeless tobacco: Never Used  . Alcohol Use: Yes     social  . Drug Use: No  . Sexually Active: Not on file   Other Topics Concern  . Not on file   Social History Narrative  . No narrative on file    ROS ALL NEGATIVE EXCEPT THOSE NOTED IN HPI  PE  General Appearance: well developed, well nourished in no acute distress HEENT: symmetrical face, PERRLA, good dentition  Neck: no JVD, thyromegaly, or adenopathy, trachea midline Chest: symmetric without deformity Cardiac: PMI non-displaced, RRR, normal S1, S2, no gallop or murmur Lung: clear to ausculation and percussion Vascular: all pulses full without bruits  Abdominal: nondistended, nontender, good bowel sounds, no HSM, no bruits Extremities: no cyanosis, clubbing or edema, no sign of DVT, no varicosities  Skin: normal color, no rashes Neuro: alert and oriented x 3, non-focal Pysch: normal affect  EKG Normal sinus rhythm, incomplete right bundle branch block,  no change. BMET    Component Value Date/Time   NA 143 08/08/2008 1038   K 3.6 08/08/2008 1038   CL 112 08/08/2008 1038   CO2 27 08/08/2008 1038   GLUCOSE 82 08/08/2008 1038   BUN 15 08/08/2008 1038   CREATININE 0.7 08/08/2008 1038   CALCIUM 9.1 08/08/2008 1038   GFRNONAA 89.30 08/08/2008 1038   GFRAA  Value: >60        The eGFR has been calculated using the MDRD equation. This calculation has not been validated in all clinical situations. eGFR's persistently <60 mL/min signify possible Chronic Kidney Disease. 03/04/2008 0520    Lipid Panel  No results found for this basename: chol, trig, hdl, cholhdl, vldl, ldlcalc    CBC    Component Value Date/Time   WBC 17.6* 03/04/2008 0520   RBC 4.67 03/04/2008 0520   HGB 13.7 03/04/2008 0520   HCT 40.5 03/04/2008 0520   PLT 157 03/04/2008 0520   MCV 86.8 03/04/2008 0520   MCHC 33.8 03/04/2008 0520   RDW 14.7 03/04/2008 0520   LYMPHSABS  0.6* 03/02/2008 0935   MONOABS 0.5 03/02/2008 0935   EOSABS 0.0 03/02/2008 0935   BASOSABS 0.0 03/02/2008 0935

## 2011-06-13 ENCOUNTER — Encounter: Payer: Self-pay | Admitting: Gynecology

## 2011-06-20 ENCOUNTER — Ambulatory Visit (INDEPENDENT_AMBULATORY_CARE_PROVIDER_SITE_OTHER): Payer: Medicare Other | Admitting: Obstetrics and Gynecology

## 2011-06-20 ENCOUNTER — Encounter: Payer: Self-pay | Admitting: Obstetrics and Gynecology

## 2011-06-20 VITALS — BP 120/74 | Ht 63.5 in | Wt 136.0 lb

## 2011-06-20 DIAGNOSIS — N951 Menopausal and female climacteric states: Secondary | ICD-10-CM

## 2011-06-20 DIAGNOSIS — M899 Disorder of bone, unspecified: Secondary | ICD-10-CM

## 2011-06-20 DIAGNOSIS — M8580 Other specified disorders of bone density and structure, unspecified site: Secondary | ICD-10-CM

## 2011-06-20 DIAGNOSIS — M949 Disorder of cartilage, unspecified: Secondary | ICD-10-CM

## 2011-06-20 DIAGNOSIS — Z780 Asymptomatic menopausal state: Secondary | ICD-10-CM

## 2011-06-20 DIAGNOSIS — M1990 Unspecified osteoarthritis, unspecified site: Secondary | ICD-10-CM | POA: Insufficient documentation

## 2011-06-20 DIAGNOSIS — N898 Other specified noninflammatory disorders of vagina: Secondary | ICD-10-CM

## 2011-06-20 MED ORDER — ESTRADIOL 0.05 MG/24HR TD PTTW: MEDICATED_PATCH | TRANSDERMAL | Status: DC

## 2011-06-20 NOTE — Progress Notes (Signed)
The patient came to see me today for further followup. She remains on oral estrogen for control of menopausal symptoms. It is working well. She is having no vaginal bleeding. She is having no pelvic pain. She is up-to-date on mammograms. She does have osteopenia. She does not have an elevated fracture risk. She takes calcium and vitamin D. She has had no fractures. We are also watching her with a vaginal cyst. She is having no symptoms from the moment. She has no dysuria, frequency, urgency or incontinence. She is getting married in July. She does her lab through PCP.  ROS: 12 system review done. Pertinent positives above. Other positives are asthma, atrial fibrillation, and arthritis.  HEENT: Within normal limits. Neck: No masses. Supraclavicular lymph nodes: Not enlarged. Breasts: Examined in both sitting and lying position. Symmetrical without skin changes or masses. Abdomen: Soft no masses guarding or rebound. No hernias. Pelvic: External within normal limits. BUS within normal limits. Vaginal examination shows good estrogen effect, no cystocele enterocele or rectocele. Patient continues to have a vaginal cyst at the introitus that remains one and 1/2 cm and is stable. Cervix and uterus absent. Adnexa within normal limits. Rectovaginal confirmatory. Extremities within normal limits.  Assessment: #1. Vasomotor symptoms #2. Vaginal cyst #3. Osteopenia  Plan: Switched  patient to Vivelle patch for safety. Continue yearly mammograms. Bone density this month.

## 2011-07-09 ENCOUNTER — Other Ambulatory Visit: Payer: Self-pay | Admitting: Obstetrics and Gynecology

## 2011-07-09 DIAGNOSIS — M8580 Other specified disorders of bone density and structure, unspecified site: Secondary | ICD-10-CM

## 2011-07-10 ENCOUNTER — Telehealth: Payer: Self-pay | Admitting: Pulmonary Disease

## 2011-07-10 ENCOUNTER — Ambulatory Visit (INDEPENDENT_AMBULATORY_CARE_PROVIDER_SITE_OTHER): Payer: Medicare Other

## 2011-07-10 DIAGNOSIS — M8580 Other specified disorders of bone density and structure, unspecified site: Secondary | ICD-10-CM

## 2011-07-10 DIAGNOSIS — M949 Disorder of cartilage, unspecified: Secondary | ICD-10-CM

## 2011-07-10 DIAGNOSIS — M899 Disorder of bone, unspecified: Secondary | ICD-10-CM

## 2011-07-10 MED ORDER — DOXYCYCLINE MONOHYDRATE 100 MG PO CAPS: 100.0000 mg | ORAL_CAPSULE | Freq: Two times a day (BID) | ORAL | Status: DC

## 2011-07-10 NOTE — Telephone Encounter (Signed)
LMTCB x 1 

## 2011-07-10 NOTE — Telephone Encounter (Signed)
rx doxycycline 100mg  bid x 7days

## 2011-07-10 NOTE — Telephone Encounter (Signed)
THIS IS A PEW PT, NOT SN  Pt returned call.  Pt c/o prod cough with green mucus, some increased SOB and wheezing x6 days.  Denies f/c/s.  Going out of town next week; reports doxycycline has worked well for her in the past.  Dr Delford Field please advise, thanks.  Allergies  Allergen Reactions  . Amoxicillin-Pot Clavulanate     Emesis, nausea  . Moxifloxacin     REACTION: rash   [facility] [address].

## 2011-07-18 ENCOUNTER — Other Ambulatory Visit: Payer: Self-pay | Admitting: *Deleted

## 2011-07-18 MED ORDER — LEVALBUTEROL TARTRATE 45 MCG/ACT IN AERO: INHALATION_SPRAY | RESPIRATORY_TRACT | Status: DC

## 2011-08-10 ENCOUNTER — Other Ambulatory Visit: Payer: Self-pay | Admitting: Cardiology

## 2011-09-13 ENCOUNTER — Other Ambulatory Visit: Payer: Self-pay | Admitting: Critical Care Medicine

## 2011-09-19 ENCOUNTER — Encounter: Payer: Self-pay | Admitting: Pulmonary Disease

## 2011-09-19 ENCOUNTER — Ambulatory Visit (INDEPENDENT_AMBULATORY_CARE_PROVIDER_SITE_OTHER): Payer: Medicare Other | Admitting: Pulmonary Disease

## 2011-09-19 ENCOUNTER — Telehealth: Payer: Self-pay | Admitting: Critical Care Medicine

## 2011-09-19 VITALS — BP 128/80 | HR 79 | Temp 98.2°F | Ht 65.0 in | Wt 133.2 lb

## 2011-09-19 DIAGNOSIS — J45901 Unspecified asthma with (acute) exacerbation: Secondary | ICD-10-CM

## 2011-09-19 MED ORDER — PREDNISONE 10 MG PO TABS: ORAL_TABLET | ORAL | Status: AC

## 2011-09-19 MED ORDER — DOXYCYCLINE HYCLATE 100 MG PO TABS: 100.0000 mg | ORAL_TABLET | Freq: Two times a day (BID) | ORAL | Status: AC

## 2011-09-19 NOTE — Telephone Encounter (Signed)
Pt states  that she has increase sob,productive cough. Had to use her xopenex this am .deneis any Fever,st ,wheezing. Pt states going out of town and would like doxy called in . Dr Delford Field is out of the office sending this to  Dr Craige Cotta > Please advise

## 2011-09-19 NOTE — Patient Instructions (Signed)
Rx for doxy x 7days - start taking for yellow/green phlegm Rx for prednisone 10 mg tabs Take  2 tabs daily x 4 days, then 1 tab daily x4 days then stop. #15 Enjoy the cruise

## 2011-09-19 NOTE — Telephone Encounter (Signed)
Dr. Vassie Loll had a cancellation, pt set to see RA at 2:45pm.Dedra Matsuo Yancey Flemings, CMA

## 2011-09-19 NOTE — Telephone Encounter (Signed)
Not sure she needs antibiotics.  She needs ROV to further address.

## 2011-09-19 NOTE — Assessment & Plan Note (Signed)
Since she is going to [facility], will give her Rx for doxy & prednisone rapid taper that she can take in case sputum changes color or if wheezing. Ct advair xopenex prn

## 2011-09-19 NOTE — Progress Notes (Signed)
  Subjective:    Patient ID: Elizabeth Mcconnell, female    DOB: 1945-07-25, 66 y.o.   MRN: [id number]  HPI 66 y.o. white female with a history of moderate persistent asthma with significant atopic features, very high positive IgE level.    09/19/2011 66m FU - acute OV PW PT. Pt c/o increase SOB, productive cough (not sure what color) , PND, slight chest congestion, slight sinus pressure x this AM. Pt is leaving out of the country saturday and will be gone for 2 weeks she has increase sob,productive cough. Had to use her xopenex this am  Going to [place] Used doxy in 5/13 Last pred taper was few yrs ago Asthma symptoms in general well controlled on advair    Review of Systems Pt denies any significant sore throat, nasal congestion or excess secretions, fever, chills, sweats, unintended weight loss, pleurtic or exertional chest pain, orthopnea PND, or leg swelling  Pt denies any increase in rescue therapy over baseline, denies waking up needing it or having any early am or nocturnal exacerbations of coughing/wheezing/or dyspnea.  Pt also denies any obvious fluctuation in symptoms with weather or environmental change or other alleviating or aggravating factors    Objective:   Physical Exam Gen. Pleasant, well-nourished, in no distress ENT - no lesions, no post nasal drip Neck: No JVD, no thyromegaly, no carotid bruits Lungs: no use of accessory muscles, no dullness to percussion, clear without rales or rhonchi  Cardiovascular: Rhythm regular, heart sounds  normal, no murmurs or gallops, no peripheral edema Musculoskeletal: No deformities, no cyanosis or clubbing         Assessment & Plan:

## 2011-10-22 ENCOUNTER — Ambulatory Visit (INDEPENDENT_AMBULATORY_CARE_PROVIDER_SITE_OTHER): Payer: Medicare Other | Admitting: Critical Care Medicine

## 2011-10-22 ENCOUNTER — Encounter: Payer: Self-pay | Admitting: Critical Care Medicine

## 2011-10-22 VITALS — BP 128/76 | HR 64 | Temp 98.4°F | Ht 65.0 in | Wt 136.2 lb

## 2011-10-22 DIAGNOSIS — J45909 Unspecified asthma, uncomplicated: Secondary | ICD-10-CM

## 2011-10-22 MED ORDER — FLUTICASONE PROPIONATE 50 MCG/ACT NA SUSP: 2.0000 | Freq: Every day | NASAL | Status: DC

## 2011-10-22 MED ORDER — FLUTICASONE-SALMETEROL 250-50 MCG/DOSE IN AEPB: 1.0000 | INHALATION_SPRAY | Freq: Two times a day (BID) | RESPIRATORY_TRACT | Status: DC

## 2011-10-22 MED ORDER — MONTELUKAST SODIUM 10 MG PO TABS: 10.0000 mg | ORAL_TABLET | Freq: Every day | ORAL | Status: DC

## 2011-10-22 NOTE — Patient Instructions (Signed)
No change in medications. Return in         4 months 

## 2011-10-22 NOTE — Progress Notes (Signed)
Subjective:    Patient ID: Elizabeth Mcconnell, female    DOB: 11/25/1945, 66 y.o.   MRN: [id number]  HPI  66 y.o. white female with a history of moderate persistent asthma with significant atopic features, very high positive IgE level.    7/18 38m FU - acute OV PW PT. Pt c/o increase SOB, productive cough (not sure what color) , PND, slight chest congestion, slight sinus pressure x this AM. Pt is leaving out of the country saturday and will be gone for 2 weeks she has increase sob,productive cough. Had to use her xopenex this am  Going to [place] Used doxy in 5/13 Last pred taper was few yrs ago Asthma symptoms in general well controlled on advair  10/22/2011 Pt did well on [place] trip.Rx doxy/pred and did not fill. Pt denies any significant sore throat, nasal congestion or excess secretions, fever, chills, sweats, unintended weight loss, pleurtic or exertional chest pain, orthopnea PND, or leg swelling Pt denies any increase in rescue therapy over baseline, denies waking up needing it or having any early am or nocturnal exacerbations of coughing/wheezing/or dyspnea. Pt also denies any obvious fluctuation in symptoms with  weather or environmental change or other alleviating or aggravating factors    Review of Systems  Pt denies any significant sore throat, nasal congestion or excess secretions, fever, chills, sweats, unintended weight loss, pleurtic or exertional chest pain, orthopnea PND, or leg swelling  Pt denies any increase in rescue therapy over baseline, denies waking up needing it or having any early am or nocturnal exacerbations of coughing/wheezing/or dyspnea.  Pt also denies any obvious fluctuation in symptoms with weather or environmental change or other alleviating or aggravating factors    Objective:   Physical Exam  Filed Vitals:   10/22/11 1543  BP: 128/76  Pulse: 64  Temp: 98.4 F (36.9 C)  TempSrc: Oral  Height: 5\' 5"  (1.651 m)  Weight: 136 lb 3.2 oz (61.78  kg)  SpO2: 96%    Gen: Pleasant, well-nourished, in no distress,  normal affect  ENT: No lesions,  mouth clear,  oropharynx clear, no postnasal drip  Neck: No JVD, no TMG, no carotid bruits  Lungs: No use of accessory muscles, no dullness to percussion, clear without rales or rhonchi  Cardiovascular: RRR, heart sounds normal, no murmur or gallops, no peripheral edema  Abdomen: soft and NT, no HSM,  BS normal  Musculoskeletal: No deformities, no cyanosis or clubbing  Neuro: alert, non focal  Skin: Warm, no lesions or rashes  No results found.    Assessment & Plan:   Extrinsic asthma, unspecified Moderate persistent asthma stable at this time Plan Maintain inhaled medications as prescribed Return 4 months   Updated Medication List Outpatient Encounter Prescriptions as of 10/22/2011  Medication Sig Dispense Refill  . aspirin 81 MG tablet Take 81 mg by mouth daily.        . Calcium Carbonate-Vitamin D (CALCIUM-VITAMIN D) 500-200 MG-UNIT per tablet Take 1 tablet by mouth daily.        . celecoxib (CELEBREX) 200 MG capsule Take 200 mg by mouth daily.      [name] diltiazem (CARDIZEM CD) 300 MG 24 hr capsule TAKE ONE CAPSULE BY MOUTH ONE TIME DAILY  30 capsule  5  . estradiol (VIVELLE-DOT) 0.05 MG/24HR Change patch two times a week  24 patch  3  . fluticasone (FLONASE) 50 MCG/ACT nasal spray Place 2 sprays into the nose daily.  16 g  6  . Fluticasone-Salmeterol (  ADVAIR DISKUS) 250-50 MCG/DOSE AEPB Inhale 1 puff into the lungs 2 (two) times daily.  60 each  6  . levalbuterol (XOPENEX HFA) 45 MCG/ACT inhaler Inhale 1-2 puffs using inhaler every four hours as needed      . montelukast (SINGULAIR) 10 MG tablet Take 1 tablet (10 mg total) by mouth daily.  30 tablet  6  . DISCONTD: fluticasone (FLONASE) 50 MCG/ACT nasal spray Place 2 sprays into the nose daily.  16 g  6  . DISCONTD: Fluticasone-Salmeterol (ADVAIR DISKUS) 250-50 MCG/DOSE AEPB Inhale 1 puff into the lungs 2 (two) times daily.   60 each  6  . DISCONTD: levalbuterol (XOPENEX HFA) 45 MCG/ACT inhaler Inhale 1-2 puffs using inhaler every four hours as needed put on hold, pt does not yet need  1 Inhaler  5  . DISCONTD: montelukast (SINGULAIR) 10 MG tablet Take 1 tablet (10 mg total) by mouth daily.  30 tablet  6  . DISCONTD: Cholecalciferol (VITAMIN D PO) Take by mouth.

## 2011-10-22 NOTE — Assessment & Plan Note (Signed)
Moderate persistent asthma stable at this time Plan Maintain inhaled medications as prescribed Return 4 months 

## 2012-02-19 ENCOUNTER — Encounter: Payer: Self-pay | Admitting: Critical Care Medicine

## 2012-02-19 ENCOUNTER — Ambulatory Visit (INDEPENDENT_AMBULATORY_CARE_PROVIDER_SITE_OTHER): Payer: Medicare Other | Admitting: Critical Care Medicine

## 2012-02-19 VITALS — BP 114/78 | HR 82 | Temp 98.0°F | Ht 64.5 in | Wt 144.0 lb

## 2012-02-19 DIAGNOSIS — J45909 Unspecified asthma, uncomplicated: Secondary | ICD-10-CM

## 2012-02-19 MED ORDER — MONTELUKAST SODIUM 10 MG PO TABS: 10.0000 mg | ORAL_TABLET | Freq: Every day | ORAL | Status: DC

## 2012-02-19 MED ORDER — FLUTICASONE PROPIONATE 50 MCG/ACT NA SUSP: 2.0000 | Freq: Every day | NASAL | Status: DC

## 2012-02-19 MED ORDER — FLUTICASONE-SALMETEROL 250-50 MCG/DOSE IN AEPB: 1.0000 | INHALATION_SPRAY | Freq: Two times a day (BID) | RESPIRATORY_TRACT | Status: DC

## 2012-02-19 NOTE — Patient Instructions (Addendum)
No change in medications. Return in         4 months 

## 2012-02-19 NOTE — Progress Notes (Signed)
Subjective:    Patient ID: Elizabeth Mcconnell, female    DOB: [birth year], 66 y.o.   MRN: [id number]  Asthma Her past medical history is significant for asthma.   66 y.o. Elizabeth Mcconnell white female with a history of moderate persistent asthma with significant atopic features, very high positive IgE level.   02/19/2012 Since last ov doing well. No abx used since last ov Pt denies any significant sore throat, nasal congestion or excess secretions, fever, chills, sweats, unintended weight loss, pleurtic or exertional chest pain, orthopnea PND, or leg swelling Pt denies any increase in rescue therapy over baseline, denies waking up needing it or having any early am or nocturnal exacerbations of coughing/wheezing/or dyspnea. Pt also denies any obvious fluctuation in symptoms with  weather or environmental change or other alleviating or aggravating factors  PUL ASTHMA HISTORY 02/19/2012 10/22/2011 04/03/2011 10/03/2010  Symptoms 0-2 days/week 0-2 days/week 0-2 days/week 0-2 days/week  Nighttime awakenings 0-2/month 0-2/month 0-2/month 0-2/month  Interference with activity No limitations No limitations No limitations No limitations  SABA use 0-2 days/wk 0-2 days/wk 0-2 days/wk 0-2 days/wk  Exacerbations requiring oral steroids 0-1 / year 0-1 / year 0-1 / year 0-1 / year   Past Medical History  Diagnosis Date  . Pneumothorax   . Tobacco abuse   . Allergic rhinitis   . Oral candidiasis   . Atrial fibrillation   . Asthma   . 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 Endometriosis   . Arthritis      Family History  Problem Relation Age of Onset  . Stroke Father 100  . Hypertension Father   . Heart disease Father   . Diabetes Mother      History   Social History  . Marital Status: Married    Spouse Name: N/A    Number of Children: 2  . Years of Education: N/A   Occupational History  . [employer] Other   Social History Main Topics  . Smoking  status: Former Smoker -- 0.5 packs/day for 20 years    Quit date: 03/04/2004  . Smokeless tobacco: Never Used  . Alcohol Use: 3.5 oz/week    7 drink(s) per week     Comment: social  . Drug Use: No  . Sexually Active: Yes    Birth Control/ Protection: Surgical   Other Topics Concern  . Not on file   Social History Narrative  . No narrative on file     Allergies  Allergen Reactions  . Amoxicillin-Pot Clavulanate     Emesis, nausea  . Moxifloxacin     REACTION: rash     Outpatient Prescriptions Prior to Visit  Medication Sig Dispense Refill  . aspirin 81 MG tablet Take 81 mg by mouth daily.        . Calcium Carbonate-Vitamin D (CALCIUM-VITAMIN D) 500-200 MG-UNIT per tablet Take 1 tablet by mouth daily.        . celecoxib (CELEBREX) 200 MG capsule Take 200 mg by mouth daily.      Elizabeth Mcconnell diltiazem (CARDIZEM CD) 300 MG 24 hr capsule TAKE ONE CAPSULE BY MOUTH ONE TIME DAILY  30 capsule  5  . estradiol (VIVELLE-DOT) 0.05 MG/24HR Change patch two times a week  24 patch  3  . levalbuterol (XOPENEX HFA) 45 MCG/ACT inhaler Inhale 1-2 puffs using inhaler every four hours as needed      . [DISCONTINUED] fluticasone (FLONASE) 50 MCG/ACT nasal spray  Place 2 sprays into the nose daily.  16 g  6  . [DISCONTINUED] Fluticasone-Salmeterol (ADVAIR DISKUS) 250-50 MCG/DOSE AEPB Inhale 1 puff into the lungs 2 (two) times daily.  60 each  6  . [DISCONTINUED] montelukast (SINGULAIR) 10 MG tablet Take 1 tablet (10 mg total) by mouth daily.  30 tablet  6   Last reviewed on 02/19/2012  3:19 PM by Storm Frisk, MD    Review of Systems  Pt denies any significant sore throat, nasal congestion or excess secretions, fever, chills, sweats, unintended weight loss, pleurtic or exertional chest pain, orthopnea PND, or leg swelling  Pt denies any increase in rescue therapy over baseline, denies waking up needing it or having any early am or nocturnal exacerbations of coughing/wheezing/or dyspnea.  Pt also denies  any obvious fluctuation in symptoms with weather or environmental change or other alleviating or aggravating factors    Objective:   Physical Exam  Filed Vitals:   02/19/12 1448  BP: 114/78  Pulse: 82  Temp: 98 F (36.7 C)  TempSrc: Oral  Height: 5' 4.5" (1.638 m)  Weight: 144 lb (65.318 kg)  SpO2: 97%    Gen: Pleasant, well-nourished, in no distress,  normal affect  ENT: No lesions,  mouth clear,  oropharynx clear, no postnasal drip  Neck: No JVD, no TMG, no carotid bruits  Lungs: No use of accessory muscles, no dullness to percussion, clear without rales or rhonchi  Cardiovascular: RRR, heart sounds normal, no murmur or gallops, no peripheral edema  Abdomen: soft and NT, no HSM,  BS normal  Musculoskeletal: No deformities, no cyanosis or clubbing  Neuro: alert, non focal  Skin: Warm, no lesions or rashes      Assessment & Plan:   Extrinsic asthma, unspecified Moderate persistent asthma stable at this time Plan Maintain inhaled medications as prescribed Return 4 months    Updated Medication List Outpatient Encounter Prescriptions as of 02/19/2012  Medication Sig Dispense Refill  . aspirin 81 MG tablet Take 81 mg by mouth daily.        . Calcium Carbonate-Vitamin D (CALCIUM-VITAMIN D) 500-200 MG-UNIT per tablet Take 1 tablet by mouth daily.        . celecoxib (CELEBREX) 200 MG capsule Take 200 mg by mouth daily.      Elizabeth Mcconnell diltiazem (CARDIZEM CD) 300 MG 24 hr capsule TAKE ONE CAPSULE BY MOUTH ONE TIME DAILY  30 capsule  5  . estradiol (VIVELLE-DOT) 0.05 MG/24HR Change patch two times a week  24 patch  3  . fluticasone (FLONASE) 50 MCG/ACT nasal spray Place 2 sprays into the nose daily.  16 g  6  . Fluticasone-Salmeterol (ADVAIR DISKUS) 250-50 MCG/DOSE AEPB Inhale 1 puff into the lungs 2 (two) times daily.  60 each  6  . levalbuterol (XOPENEX HFA) 45 MCG/ACT inhaler Inhale 1-2 puffs using inhaler every four hours as needed      . montelukast (SINGULAIR) 10 MG  tablet Take 1 tablet (10 mg total) by mouth daily.  30 tablet  6  . [DISCONTINUED] fluticasone (FLONASE) 50 MCG/ACT nasal spray Place 2 sprays into the nose daily.  16 g  6  . [DISCONTINUED] Fluticasone-Salmeterol (ADVAIR DISKUS) 250-50 MCG/DOSE AEPB Inhale 1 puff into the lungs 2 (two) times daily.  60 each  6  . [DISCONTINUED] montelukast (SINGULAIR) 10 MG tablet Take 1 tablet (10 mg total) by mouth daily.  30 tablet  6

## 2012-02-19 NOTE — Assessment & Plan Note (Addendum)
Moderate persistent asthma stable at this time Plan Maintain inhaled medications as prescribed Return 4 months

## 2012-02-27 ENCOUNTER — Other Ambulatory Visit: Payer: Self-pay

## 2012-02-27 MED ORDER — DILTIAZEM HCL ER COATED BEADS 300 MG PO CP24: 300.0000 mg | ORAL_CAPSULE | Freq: Every day | ORAL | Status: DC

## 2012-04-27 ENCOUNTER — Ambulatory Visit
Admission: RE | Admit: 2012-04-27 | Discharge: 2012-04-27 | Disposition: A | Discharge status: Home / Self Care | Payer: Medicare PPO | Source: Ambulatory Visit | Attending: Internal Medicine | Admitting: Internal Medicine

## 2012-04-27 ENCOUNTER — Other Ambulatory Visit: Payer: Self-pay | Admitting: Internal Medicine

## 2012-04-27 DIAGNOSIS — R52 Pain, unspecified: Secondary | ICD-10-CM

## 2012-05-22 ENCOUNTER — Encounter: Payer: Self-pay | Admitting: Gynecology

## 2012-06-14 ENCOUNTER — Other Ambulatory Visit: Payer: Self-pay | Admitting: Critical Care Medicine

## 2012-06-15 NOTE — Telephone Encounter (Signed)
Singulair 10 mg rx was sent on 02/19/2012 #30 x 6 Pt should still have rxs left. [facility], spoke with [facility]. Was advised they do have this rx on Hold with all remaining rxs. He will get medication ready for pt. Nothing further needed.

## 2012-07-09 ENCOUNTER — Other Ambulatory Visit: Payer: Self-pay | Admitting: *Deleted

## 2012-07-09 MED ORDER — ESTRADIOL 0.05 MG/24HR TD PTTW: MEDICATED_PATCH | TRANSDERMAL | Status: DC

## 2012-07-09 NOTE — Telephone Encounter (Signed)
Pt has annual 07/28/12 KW

## 2012-07-16 ENCOUNTER — Encounter: Payer: Self-pay | Admitting: *Deleted

## 2012-07-21 ENCOUNTER — Encounter: Payer: Self-pay | Admitting: Critical Care Medicine

## 2012-07-21 ENCOUNTER — Ambulatory Visit (INDEPENDENT_AMBULATORY_CARE_PROVIDER_SITE_OTHER): Payer: Medicare PPO | Admitting: Critical Care Medicine

## 2012-07-21 VITALS — BP 120/72 | HR 71 | Temp 98.1°F | Ht 65.0 in | Wt 150.6 lb

## 2012-07-21 DIAGNOSIS — J45909 Unspecified asthma, uncomplicated: Secondary | ICD-10-CM

## 2012-07-21 MED ORDER — FLUTICASONE-SALMETEROL 250-50 MCG/DOSE IN AEPB: 1.0000 | INHALATION_SPRAY | Freq: Two times a day (BID) | RESPIRATORY_TRACT | Status: DC

## 2012-07-21 MED ORDER — MONTELUKAST SODIUM 10 MG PO TABS: 10.0000 mg | ORAL_TABLET | Freq: Every day | ORAL | Status: DC

## 2012-07-21 MED ORDER — FLUTICASONE PROPIONATE 50 MCG/ACT NA SUSP: 2.0000 | Freq: Every day | NASAL | Status: DC

## 2012-07-21 NOTE — Patient Instructions (Addendum)
No change in medications. Return in         4 months 

## 2012-07-21 NOTE — Progress Notes (Signed)
Subjective:    Patient ID: Elizabeth Mcconnell, female    DOB: 1946-02-01, 67 y.o.   MRN: [id number]  HPI 07/21/12 Hx asthma Last OV 02/2012 Mild flareup early 04/2012.  Pt physical with PCP and ok Pt notes some arthritis in neck. MRI pending. Rx prednisone since 05/02/12 10mg  /d.   PUL ASTHMA HISTORY 07/22/2012 02/19/2012 10/22/2011 04/03/2011 10/03/2010  Symptoms 0-2 days/week 0-2 days/week 0-2 days/week 0-2 days/week 0-2 days/week  Nighttime awakenings 0-2/month 0-2/month 0-2/month 0-2/month 0-2/month  Interference with activity No limitations No limitations No limitations No limitations No limitations  SABA use 0-2 days/wk 0-2 days/wk 0-2 days/wk 0-2 days/wk 0-2 days/wk  Exacerbations requiring oral steroids 0-1 / year 0-1 / year 0-1 / year 0-1 / year 0-1 / year      Review of Systems 11 pt ros neg     Objective:   Physical Exam Filed Vitals:   07/21/12 1419  BP: 120/72  Pulse: 71  Temp: 98.1 F (36.7 C)  TempSrc: Oral  Height: 5\' 5"  (1.651 m)  Weight: 68.312 kg (150 lb 9.6 oz)  SpO2: 95%    Gen: Pleasant, well-nourished, in no distress,  normal affect  ENT: No lesions,  mouth clear,  oropharynx clear, no postnasal drip  Neck: No JVD, no TMG, no carotid bruits  Lungs: No use of accessory muscles, no dullness to percussion, clear without rales or rhonchi  Cardiovascular: RRR, heart sounds normal, no murmur or gallops, no peripheral edema  Abdomen: soft and NT, no HSM,  BS normal  Musculoskeletal: No deformities, no cyanosis or clubbing  Neuro: alert, non focal  Skin: Warm, no lesions or rashes  No results found.        Assessment & Plan:   Moderate persistent asthma with significant atopic features Stable moderate persistent asthma with atopy Plan No change in inhaled or maintenance medications. Return in  4 months    Updated Medication List Outpatient Encounter Prescriptions as of 07/21/2012  Medication Sig Dispense Refill  . aspirin 81 MG tablet  Take 81 mg by mouth daily.        [name] Bioflavonoid Products (BIOFLEX PO) Take 600 mg by mouth daily.      . Calcium Carbonate-Vitamin D (CALCIUM-VITAMIN D) 500-200 MG-UNIT per tablet Take 1 tablet by mouth daily.        [name] diltiazem (CARDIZEM CD) 300 MG 24 hr capsule Take 1 capsule (300 mg total) by mouth daily.  30 capsule  5  . estradiol (VIVELLE-DOT) 0.05 MG/24HR Change patch two times a week  24 patch  0  . fluticasone (FLONASE) 50 MCG/ACT nasal spray Place 2 sprays into the nose daily.  16 g  11  . Fluticasone-Salmeterol (ADVAIR DISKUS) 250-50 MCG/DOSE AEPB Inhale 1 puff into the lungs 2 (two) times daily.  60 each  11  . levalbuterol (XOPENEX HFA) 45 MCG/ACT inhaler Inhale 1-2 puffs using inhaler every four hours as needed      . montelukast (SINGULAIR) 10 MG tablet Take 1 tablet (10 mg total) by mouth daily.  30 tablet  11  . predniSONE (DELTASONE) 10 MG tablet Take 10 mg by mouth daily.      . [DISCONTINUED] fluticasone (FLONASE) 50 MCG/ACT nasal spray Place 2 sprays into the nose daily.  16 g  6  . [DISCONTINUED] Fluticasone-Salmeterol (ADVAIR DISKUS) 250-50 MCG/DOSE AEPB Inhale 1 puff into the lungs 2 (two) times daily.  60 each  6  . [DISCONTINUED] montelukast (SINGULAIR) 10 MG tablet Take 1 tablet (  10 mg total) by mouth daily.  30 tablet  6  . [DISCONTINUED] celecoxib (CELEBREX) 200 MG capsule Take 200 mg by mouth daily.       No facility-administered encounter medications on file as of 07/21/2012.

## 2012-07-22 NOTE — Assessment & Plan Note (Addendum)
Stable moderate persistent asthma with atopy Plan No change in inhaled or maintenance medications. Return in  4 months

## 2012-07-24 ENCOUNTER — Ambulatory Visit (INDEPENDENT_AMBULATORY_CARE_PROVIDER_SITE_OTHER): Payer: Medicare PPO | Admitting: Cardiology

## 2012-07-24 ENCOUNTER — Encounter: Payer: Self-pay | Admitting: Cardiology

## 2012-07-24 VITALS — BP 124/72 | HR 86 | Ht 65.0 in | Wt 149.1 lb

## 2012-07-24 DIAGNOSIS — I4891 Unspecified atrial fibrillation: Secondary | ICD-10-CM

## 2012-07-24 NOTE — Assessment & Plan Note (Signed)
We'll repeat echocardiogram since it's been 4 years since her last study. Still at low risk for stroke and as noted as  her A. fib always occurs in the setting of an asthmatic or bronchitic  flare. We'll continue diltiazem and aspirin. Her CHADS score is also low. I'll have her followup in one year with Dr. Sw[place]iland.

## 2012-07-24 NOTE — Patient Instructions (Addendum)
Your physician has requested that you have an echocardiogram. Echocardiography is a painless test that uses sound waves to create images of your heart. It provides your doctor with information about the size and shape of your heart and how well your heart's chambers and valves are working. This procedure takes approximately one hour. There are no restrictions for this procedure.  Your physician recommends that you continue on your current medications as directed. Please refer to the Current Medication list given to you today.  Your physician wants you to follow-up in: 1 year with Dr. Peter Swaziland. You will receive a reminder letter in the mail two months in advance. If you don't receive a letter, please call our office to schedule the follow-up appointment.

## 2012-07-24 NOTE — Progress Notes (Signed)
HPI Elizabeth Mcconnell comes in today for the evaluation and management for the evaluation and management of her history of paroxysmal atrial fibrillation. This is always been in the setting of acute bronchitis and asthmatic flares. Echocardiogram 2010 was essentially normal.  She recently got remarried. She has not had any symptoms of atrial fib. She is on prednisone now and is bruising easily. She remains on aspirin daily. [name] also on diltiazem.  Past Medical History  Diagnosis Date  . Pneumothorax   . Tobacco abuse   . Allergic rhinitis   . Oral candidiasis   . Atrial fibrillation   . Asthma   . 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 Endometriosis   . Arthritis     Current Outpatient Prescriptions  Medication Sig Dispense Refill  . aspirin 81 MG tablet Take 81 mg by mouth daily.        [name] Bioflavonoid Products (BIOFLEX PO) Take 600 mg by mouth daily.      . Calcium Carbonate-Vitamin D (CALCIUM-VITAMIN D) 500-200 MG-UNIT per tablet Take 1 tablet by mouth daily.        [name] diltiazem (CARDIZEM CD) 300 MG 24 hr capsule Take 1 capsule (300 mg total) by mouth daily.  30 capsule  5  . estradiol (VIVELLE-DOT) 0.05 MG/24HR Change patch two times a week  24 patch  0  . fluticasone (FLONASE) 50 MCG/ACT nasal spray Place 2 sprays into the nose daily.  16 g  11  . Fluticasone-Salmeterol (ADVAIR DISKUS) 250-50 MCG/DOSE AEPB Inhale 1 puff into the lungs 2 (two) times daily.  60 each  11  . levalbuterol (XOPENEX HFA) 45 MCG/ACT inhaler Inhale 1-2 puffs using inhaler every four hours as needed      . montelukast (SINGULAIR) 10 MG tablet Take 1 tablet (10 mg total) by mouth daily.  30 tablet  11  . predniSONE (DELTASONE) 10 MG tablet Take 10 mg by mouth daily.       No current facility-administered medications for this visit.    Allergies  Allergen Reactions  . Amoxicillin-Pot Clavulanate     Emesis, nausea  . Moxifloxacin     REACTION: rash    Family History  Problem  Relation Age of Onset  . Stroke Father 18  . Hypertension Father   . Heart disease Father   . Diabetes Mother     History   Social History  . Marital Status: Married    Spouse Name: N/A    Number of Children: 2  . Years of Education: N/A   Occupational History  . 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     Social History Main Topics  . Smoking status: Former Smoker -- 0.50 packs/day for 20 years    Quit date: 03/04/2004  . Smokeless tobacco: Never Used  . Alcohol Use: 3.5 oz/week    7 drink(s) per week     Comment: social  . Drug Use: No  . Sexually Active: Yes    Birth Control/ Protection: Surgical   Other Topics Concern  . Not on file   Social History Narrative  . No narrative on file    ROS ALL NEGATIVE EXCEPT THOSE NOTED IN HPI  PE  General Appearance: well developed, well nourished in no acute distress HEENT: symmetrical face, PERRLA, good dentition  Neck: no JVD, thyromegaly, or adenopathy, trachea midline Chest: symmetric without deformity Cardiac: PMI non-displaced, RRR, normal S1, S2, no gallop or murmur Lung:  clear to ausculation and percussion Vascular: all pulses full without bruits  Abdominal: nondistended, nontender, good bowel sounds, no HSM, no bruits Extremities: no cyanosis, clubbing or edema, no sign of DVT, no varicosities  Skin: normal color, no rashes, bruising of the arms Neuro: alert and oriented x 3, non-focal Pysch: normal affect  EKG Normal sinus rhythm with possible left atrial enlargement with incomplete right bundle branch block. There is also evidence of right atrial enlargement. BMET    Component Value Date/Time   NA 143 08/08/2008 1038   K 3.6 08/08/2008 1038   CL 112 08/08/2008 1038   CO2 27 08/08/2008 1038   GLUCOSE 82 08/08/2008 1038   BUN 15 08/08/2008 1038   CREATININE 0.7 08/08/2008 1038   CALCIUM 9.1 08/08/2008 1038   GFRNONAA 89.30 08/08/2008 1038   GFRAA  Value: >60        The eGFR has been calculated using the MDRD equation. This  calculation has not been validated in all clinical situations. eGFR's persistently <60 mL/min signify possible Chronic Kidney Disease. 03/04/2008 0520    Lipid Panel  No results found for this basename: chol, trig, hdl, cholhdl, vldl, ldlcalc    CBC    Component Value Date/Time   WBC 17.6* 03/04/2008 0520   RBC 4.67 03/04/2008 0520   HGB 13.7 03/04/2008 0520   HCT 40.5 03/04/2008 0520   PLT 157 03/04/2008 0520   MCV 86.8 03/04/2008 0520   MCHC 33.8 03/04/2008 0520   RDW 14.7 03/04/2008 0520   LYMPHSABS 0.6* 03/02/2008 0935   MONOABS 0.5 03/02/2008 0935   EOSABS 0.0 03/02/2008 0935   BASOSABS 0.0 03/02/2008 0935

## 2012-07-28 ENCOUNTER — Ambulatory Visit (INDEPENDENT_AMBULATORY_CARE_PROVIDER_SITE_OTHER): Payer: Medicare PPO | Admitting: Gynecology

## 2012-07-28 ENCOUNTER — Encounter: Payer: Self-pay | Admitting: Gynecology

## 2012-07-28 VITALS — BP 130/74 | Ht 65.0 in | Wt 150.0 lb

## 2012-07-28 DIAGNOSIS — Z79890 Hormone replacement therapy (postmenopausal): Secondary | ICD-10-CM

## 2012-07-28 DIAGNOSIS — N898 Other specified noninflammatory disorders of vagina: Secondary | ICD-10-CM

## 2012-07-28 DIAGNOSIS — N951 Menopausal and female climacteric states: Secondary | ICD-10-CM

## 2012-07-28 DIAGNOSIS — M8580 Other specified disorders of bone density and structure, unspecified site: Secondary | ICD-10-CM

## 2012-07-28 DIAGNOSIS — M949 Disorder of cartilage, unspecified: Secondary | ICD-10-CM

## 2012-07-28 DIAGNOSIS — M899 Disorder of bone, unspecified: Secondary | ICD-10-CM

## 2012-07-28 MED ORDER — ESTRADIOL 0.075 MG/24HR TD PTTW: 1.0000 | MEDICATED_PATCH | TRANSDERMAL | Status: DC

## 2012-07-28 NOTE — Progress Notes (Signed)
Elizabeth Mcconnell 12-Mar-1945 [id number]        67 y.o.  G2P2002 for annual exam.  Several issues noted below. Former patient of Dr. Eda Paschal.  Past medical history,surgical history, medications, allergies, family history and social history were all reviewed and documented in the EPIC chart. ROS:  Was performed and pertinent positives and negatives are included in the history.  Exam: Kim assistant Filed Vitals:   07/28/12 1400  BP: 130/74  Height: 5\' 5"  (1.651 m)  Weight: 150 lb (68.04 kg)   General appearance  Normal Skin grossly normal Head/Neck normal with no cervical or supraclavicular adenopathy thyroid normal Lungs  clear Cardiac RR, without RMG Abdominal  soft, nontender, without masses, organomegaly or hernia Breasts  examined lying and sitting without masses, retractions, discharge or axillary adenopathy. Pelvic  Ext/BUS/vagina  With atrophic changes. Small midline posterior fourchette introital submucosal cyst 1-2 cm  Adnexa  Without masses or tenderness    Anus and perineum  normal   Rectovaginal  normal sphincter tone without palpated masses or tenderness.    Assessment/Plan:  67 y.o. [id number] female for followup exam.   1. Vaginal cyst. Benign appearing, stable on exam and has been present for years. Not bothersome to the patient. Will continue to monitor. 2. HRT. Patient was switched from estradiol 1 mg tablets to Vivelle 0.5 mg patch last year. Has worsening hot flushes night sweats since then. I reviewed HRT with her to include WHI study with risks of stroke heart attack DVT and breast cancer. ACOG and NAMS  statements for lowest dose for shortest period of time reviewed. Options to wean versus continuing discussed. She does appear to be significantly symptomatic particularly since switching from oral to transdermal. Benefits of first pass effect issues with transdermal discussed. Will increase to 0.075 mg patches and see how she does with this. Possibly going to 0.1  mg also discussed. She will call me if 0.075 is not helping and will increase to 0.1. She does have a history of atrial fibrillation apparently episodic. The issues of clot again discussed. She understands and accepts the risks. 3. Osteopenia. DEXA 07/2011 with T score -2.4 FRAX 9.2%/1.2%. Increase calcium and vitamin D reviewed. Recommended 1000 units OTC vitamin D. Recommended she have her primary check a vitamin D level at the next blood draw. 4. Mammography 05/2012. Continue with annual mammography. SBE monthly reviewed. 5. Pap smear 06/2009. No Pap smear done today. No history of significant abnormal Pap smears. Status post hysterectomy for benign indications. Over the age of 27. Discussed current screening guidelines we both agree on stops screening and she is comfortable with this. 6. Colonoscopy 2005. Due for repeat this coming year. 7. Health maintenance. No blood work done as it is all done through her primary physician's office who follows her for her medical issues. Followup 1 year, sooner as needed.    Dara Lords MD, 2:57 PM 07/28/2012

## 2012-07-28 NOTE — Patient Instructions (Signed)
Try the higher dose patch. Call me if your symptoms continue and will consider going one level higher. Followup in one year for annual exam.

## 2012-08-05 ENCOUNTER — Ambulatory Visit (HOSPITAL_COMMUNITY): Payer: Medicare PPO | Attending: Cardiology | Admitting: Radiology

## 2012-08-05 DIAGNOSIS — J45909 Unspecified asthma, uncomplicated: Secondary | ICD-10-CM | POA: Insufficient documentation

## 2012-08-05 DIAGNOSIS — I4891 Unspecified atrial fibrillation: Secondary | ICD-10-CM | POA: Insufficient documentation

## 2012-08-05 DIAGNOSIS — Z87891 Personal history of nicotine dependence: Secondary | ICD-10-CM | POA: Insufficient documentation

## 2012-08-05 NOTE — Progress Notes (Signed)
Echocardiogram performed.  

## 2012-08-12 ENCOUNTER — Telehealth: Payer: Self-pay | Admitting: Critical Care Medicine

## 2012-08-12 NOTE — Telephone Encounter (Signed)
Pharmacy requesting rx;s for  Prednisone 10 mg take 1 tablet daily  Cardizem 300 mg ><take 1 tablet day(dr Haynes Dage bill last filled this) Allergies  Allergen Reactions  . Amoxicillin-Pot Clavulanate     Emesis, nausea  . Moxifloxacin     REACTION: rash   Dr Delford Field pleas advise Thank you

## 2012-08-12 NOTE — Telephone Encounter (Signed)
i can refill prednisone but dr Patty Sermons needs to fill the cardiazem

## 2012-08-13 ENCOUNTER — Other Ambulatory Visit: Payer: Self-pay | Admitting: *Deleted

## 2012-08-13 MED ORDER — PREDNISONE 10 MG PO TABS: 10.0000 mg | ORAL_TABLET | Freq: Every day | ORAL | Status: DC

## 2012-08-13 MED ORDER — DILTIAZEM HCL ER COATED BEADS 300 MG PO CP24: 300.0000 mg | ORAL_CAPSULE | Freq: Every day | ORAL | Status: DC

## 2012-08-13 NOTE — Telephone Encounter (Signed)
Rx for prednisone has been called in. [facility] is aware that we can not fill cardiazem and they need to send that to Dr. Patty Sermons.

## 2012-08-14 ENCOUNTER — Other Ambulatory Visit: Payer: Self-pay | Admitting: *Deleted

## 2012-08-14 MED ORDER — PREDNISONE 10 MG PO TABS: 10.0000 mg | ORAL_TABLET | Freq: Every day | ORAL | Status: DC

## 2012-10-19 ENCOUNTER — Ambulatory Visit (INDEPENDENT_AMBULATORY_CARE_PROVIDER_SITE_OTHER): Payer: Medicare PPO | Admitting: Pulmonary Disease

## 2012-10-19 ENCOUNTER — Encounter: Payer: Self-pay | Admitting: Pulmonary Disease

## 2012-10-19 VITALS — BP 130/64 | Temp 97.9°F | Ht 65.0 in | Wt 151.0 lb

## 2012-10-19 DIAGNOSIS — I4891 Unspecified atrial fibrillation: Secondary | ICD-10-CM

## 2012-10-19 DIAGNOSIS — J208 Acute bronchitis due to other specified organisms: Secondary | ICD-10-CM | POA: Insufficient documentation

## 2012-10-19 DIAGNOSIS — J209 Acute bronchitis, unspecified: Secondary | ICD-10-CM

## 2012-10-19 DIAGNOSIS — J45901 Unspecified asthma with (acute) exacerbation: Secondary | ICD-10-CM | POA: Insufficient documentation

## 2012-10-19 MED ORDER — PREDNISONE 10 MG PO TABS: 10.0000 mg | ORAL_TABLET | Freq: Every day | ORAL | Status: DC

## 2012-10-19 MED ORDER — DOXYCYCLINE HYCLATE 100 MG PO TABS: 100.0000 mg | ORAL_TABLET | Freq: Two times a day (BID) | ORAL | Status: DC

## 2012-10-19 NOTE — Patient Instructions (Addendum)
Will treat you with doxycycline 100mg  one in am and pm for next 5 days. Increase prednisone to 20mg  each day until your followup visit next week. If your heart rate does not settle down despite your breathing doing better, you will need to call your cardiologist to be evaluated.  followup with NP Parrett next week to see how things are going.

## 2012-10-19 NOTE — Assessment & Plan Note (Signed)
The patient's history is most consistent with acute bronchitis.  She denies any sinus symptoms at this time.  We'll treat her with a course of antibiotics to get her through this episode.

## 2012-10-19 NOTE — Assessment & Plan Note (Signed)
The patient typically has controlled her rate on the Cardizem, but I suspect it is aggravated from over use of her rescue inhaler this past weekend.  If she does not improve with treatment of her breathing issues, she will need followup with cardiology.

## 2012-10-19 NOTE — Assessment & Plan Note (Signed)
The patient has increased shortness of breath, and has wheezing on exam today.  I will increase her prednisone to 20 mg a day, until she can be seen again next week.

## 2012-10-19 NOTE — Progress Notes (Signed)
  Subjective:    Patient ID: Elizabeth Mcconnell, female    DOB: 04-12-1945, 67 y.o.   MRN: [id number]  HPI Patient comes in today for acute sick visit.  She has known asthma, and gives a three-day history of worsening chest congestion, cough with purulent mucus, as well as increased shortness of breath and wheezing.  She has been on a prolonged prednisone taper for her spine issues, but is down to 5 mg every other day.  She has been using her rescue inhaler 4-5 times a day at least over the last 48 hours, and has noticed increased jitteriness as well as an irregular heart rate.  She does have a history of atrial fibrillation.   Review of Systems  Constitutional: Negative for fever and unexpected weight change.  HENT: Positive for congestion ( in chest). Negative for ear pain, nosebleeds, sore throat, rhinorrhea, sneezing, trouble swallowing, dental problem, postnasal drip and sinus pressure.   Eyes: Negative for redness and itching.  Respiratory: Positive for cough, chest tightness, shortness of breath and wheezing.   Cardiovascular: Negative for palpitations and leg swelling.  Gastrointestinal: Negative for nausea and vomiting.  Genitourinary: Negative for dysuria.  Musculoskeletal: Negative for joint swelling.  Skin: Negative for rash.  Neurological: Negative for headaches.  Hematological: Does not bruise/bleed easily.  Psychiatric/Behavioral: Negative for dysphoric mood. The patient is not nervous/anxious.        Objective:   Physical Exam Well-developed female in no acute distress Nose without purulent discharge noted.  Mild erythematous mucosa Oropharynx clear Neck without lymphadenopathy or thyromegaly Chest with adequate air flow, but definite rhonchi and a few isolated wheezes. Cardiac exam with irregular rhythm, with rate of approximately 110 beats per minute apically. Lower extremities without edema, no cyanosis Alert and oriented, moves all 4 extremities.        Assessment & Plan:

## 2012-10-19 NOTE — Addendum Note (Signed)
Addended by: Maisie Fus on: 10/19/2012 10:34 AM   Modules accepted: Orders

## 2012-10-23 ENCOUNTER — Other Ambulatory Visit: Payer: Self-pay | Admitting: Critical Care Medicine

## 2012-10-23 MED ORDER — LEVALBUTEROL TARTRATE 45 MCG/ACT IN AERO: INHALATION_SPRAY | RESPIRATORY_TRACT | Status: DC

## 2012-10-26 ENCOUNTER — Encounter: Payer: Self-pay | Admitting: Adult Health

## 2012-10-26 ENCOUNTER — Telehealth: Payer: Self-pay | Admitting: Cardiology

## 2012-10-26 ENCOUNTER — Ambulatory Visit (INDEPENDENT_AMBULATORY_CARE_PROVIDER_SITE_OTHER)
Admission: RE | Admit: 2012-10-26 | Discharge: 2012-10-26 | Disposition: A | Discharge status: Home / Self Care | Payer: Medicare PPO | Source: Ambulatory Visit | Attending: Adult Health | Admitting: Adult Health

## 2012-10-26 ENCOUNTER — Ambulatory Visit (INDEPENDENT_AMBULATORY_CARE_PROVIDER_SITE_OTHER): Payer: Medicare PPO | Admitting: Adult Health

## 2012-10-26 VITALS — BP 128/82 | Temp 98.3°F | Ht 65.0 in | Wt 151.4 lb

## 2012-10-26 DIAGNOSIS — J45901 Unspecified asthma with (acute) exacerbation: Secondary | ICD-10-CM

## 2012-10-26 DIAGNOSIS — I4891 Unspecified atrial fibrillation: Secondary | ICD-10-CM

## 2012-10-26 NOTE — Patient Instructions (Addendum)
Extend Doxycycline 100mg  Twice daily  For additional 5 days .  Mucinex DM Twice daily  As needed  Cough/congestion Prednisone 20mg  daily for 3 days ,then 10mg  daily for 3 days, then 5mg  daily for 3 days then back to taper recommended by family doctor.  We will refer you to your Cardilogist tomorrow to evaluate your persistent Atrial Fib.  Follow up Dr. Delford Field 4 weeks as planned and As needed

## 2012-10-26 NOTE — Progress Notes (Signed)
  Subjective:    Patient ID: Elizabeth Mcconnell, female    DOB: 12-22-1945, 67 y.o.   MRN: [id number]  HPI 67 y.o. WF with known hx of  Severe atopic asthma with extremely high IG levels, paroxysmal atrial fibrillation felt to be secondary to albuterol, allergic rhinitis,  Former smoker .  Prior history of recurrent spontaneous pneumothorax, with left lower lobe lobectomy.  10/26/2012 Follow up  Returns for 5 day follow up . Seen last ov for AEAB tx with doxycycline x 5 d and pred increased 20mg  daily . (on steroids for last 4 months for DDD) .  Was in Atrial fib-hx of PAF on cardizem. Has low [place] score -not on anticoagulation. PAF triggered by resp illness.  Says has only using xopenex few times since last ov.  She returns today feeling some better, Still has a productive cough with yellow mucus .  Still some SOB - Not much wheezing or chest tightness - Taking Prednisone 20mg  /day HR today on arrival 145, EKG showed Atral Fib with HR ~111. She has no chest pain , orthopnea, or leg swelling.  No fever. CXR today with no acute changes noted.   Review of Systems Constitutional:   No  weight loss, night sweats,  Fevers, chills, fatigue, or  lassitude.  HEENT:   No headaches,  Difficulty swallowing,  Tooth/dental problems, or  Sore throat,                No sneezing, itching, ear ache, nasal congestion, post nasal drip,   CV:  No chest pain,  Orthopnea, PND, swelling in lower extremities, anasarca, dizziness, palpitations, syncope.   GI  No heartburn, indigestion, abdominal pain, nausea, vomiting, diarrhea, change in bowel habits, loss of appetite, bloody stools.   Resp:   No chest wall deformity  Skin: no rash or lesions.  GU: no dysuria, change in color of urine, no urgency or frequency.  No flank pain, no hematuria   MS:  No joint pain or swelling.  No decreased range of motion.  No back pain.  Psych:  No change in mood or affect. No depression or anxiety.  No memory loss.         Objective:   Physical Exam GEN: A/Ox3; pleasant , NAD, well nourished   HEENT:  /AT,  EACs-clear, TMs-wnl, NOSE-clear, THROAT-clear, no lesions, no postnasal drip or exudate noted.   NECK:  Supple w/ fair ROM; no JVD; normal carotid impulses w/o bruits; no thyromegaly or nodules palpated; no lymphadenopathy.  RESP  Few exp wheezes , no accessory muscle use, no dullness to percussion  CARD:  RRR, no m/r/g  , no peripheral edema, pulses intact, no cyanosis or clubbing.  GI:   Soft & nt; nml bowel sounds; no organomegaly or masses detected.  Musco: Warm bil, no deformities or joint swelling noted.   Neuro: alert, no focal deficits noted.    Skin: Warm, no lesions or rashes         Assessment & Plan:

## 2012-10-26 NOTE — Telephone Encounter (Signed)
Rhonda with dr parrot's office wants pt seen tomorrow due to a-fib, wall  hosp dod, pa/np all full, pls advise

## 2012-10-26 NOTE — Telephone Encounter (Signed)
Returned call to patient she stated Bjorn Loser PA with Dr.Parrot wants her to be seen tomorrow 10/27/12.Patient stated she is having fast heart beat.Stated she has atrial fib and her heart rate at Dr.Parrot's office this afternoon 140's.Stated heart rate slowed down before she left 110 beats/min.Stated she is better at present heart rate seems normal.Patient was told will check with [facility] Dr.Cooper 10/27/12 and call her back.

## 2012-10-27 ENCOUNTER — Ambulatory Visit (INDEPENDENT_AMBULATORY_CARE_PROVIDER_SITE_OTHER): Payer: Medicare PPO | Admitting: Cardiovascular Disease

## 2012-10-27 ENCOUNTER — Encounter: Payer: Self-pay | Admitting: Cardiovascular Disease

## 2012-10-27 VITALS — BP 130/62 | HR 113 | Ht 65.0 in | Wt 152.0 lb

## 2012-10-27 DIAGNOSIS — I4892 Unspecified atrial flutter: Secondary | ICD-10-CM

## 2012-10-27 LAB — CBC WITH DIFFERENTIAL/PLATELET
Basophils Absolute: 0 10*3/uL (ref 0.0–0.1)
Basophils Relative: 0.3 % (ref 0.0–3.0)
Eosinophils Absolute: 0 10*3/uL (ref 0.0–0.7)
Eosinophils Relative: 0 % (ref 0.0–5.0)
HCT: 44.2 % (ref 36.0–46.0)
Hemoglobin: 15 g/dL (ref 12.0–15.0)
Lymphocytes Relative: 11.8 % — ABNORMAL LOW (ref 12.0–46.0)
Lymphs Abs: 1.7 10*3/uL (ref 0.7–4.0)
MCHC: 33.9 g/dL (ref 30.0–36.0)
MCV: 88.1 fl (ref 78.0–100.0)
Monocytes Absolute: 0.7 10*3/uL (ref 0.1–1.0)
Monocytes Relative: 4.9 % (ref 3.0–12.0)
Neutro Abs: 12.1 10*3/uL — ABNORMAL HIGH (ref 1.4–7.7)
Neutrophils Relative %: 83 % — ABNORMAL HIGH (ref 43.0–77.0)
Platelets: 251 10*3/uL (ref 150.0–400.0)
RBC: 5.01 Mil/uL (ref 3.87–5.11)
RDW: 14.1 % (ref 11.5–14.6)
WBC: 14.6 10*3/uL — ABNORMAL HIGH (ref 4.5–10.5)

## 2012-10-27 LAB — HEPATIC FUNCTION PANEL
ALT: 23 U/L (ref 0–35)
AST: 17 U/L (ref 0–37)
Albumin: 4.3 g/dL (ref 3.5–5.2)
Alkaline Phosphatase: 69 U/L (ref 39–117)
Bilirubin, Direct: 0.1 mg/dL (ref 0.0–0.3)
Total Bilirubin: 0.7 mg/dL (ref 0.3–1.2)
Total Protein: 7.4 g/dL (ref 6.0–8.3)

## 2012-10-27 LAB — BASIC METABOLIC PANEL
BUN: 20 mg/dL (ref 6–23)
CO2: 27 mEq/L (ref 19–32)
Calcium: 9.9 mg/dL (ref 8.4–10.5)
Chloride: 103 mEq/L (ref 96–112)
Creatinine, Ser: 1 mg/dL (ref 0.4–1.2)
GFR: 59.1 mL/min — ABNORMAL LOW (ref >60.00)
Glucose, Bld: 88 mg/dL (ref 70–99)
Potassium: 4.4 mEq/L (ref 3.5–5.1)
Sodium: 138 mEq/L (ref 135–145)

## 2012-10-27 MED ORDER — DOXYCYCLINE HYCLATE 100 MG PO TABS: 100.0000 mg | ORAL_TABLET | Freq: Two times a day (BID) | ORAL | Status: DC

## 2012-10-27 MED ORDER — DILTIAZEM HCL ER COATED BEADS 360 MG PO CP24: 360.0000 mg | ORAL_CAPSULE | Freq: Every day | ORAL | Status: DC

## 2012-10-27 MED ORDER — RIVAROXABAN 20 MG PO TABS: 20.0000 mg | ORAL_TABLET | Freq: Every day | ORAL | Status: DC

## 2012-10-27 NOTE — Patient Instructions (Addendum)
Your physician has recommended you make the following change in your medication: STOP Aspirin, START Xarelto 20mg  take one by mouth with your evening meal, INCREASE Cardizem CD to 360mg  take one by mouth daily  Your physician recommends that you have lab work today: BMP, LIVER, CBC  Your physician recommends that you schedule a follow-up appointment in: 2-3 WEEKS with PA/NP

## 2012-10-27 NOTE — Assessment & Plan Note (Signed)
PAF in setting of resp illness  No PNA on cxr or signs of volume overload  She cont with persistent Atrial Fib,  EKG with A Fib ~111 Refer to cardiology tomorrow for evaluation  Cont on Cardizem -  HR is trending down .

## 2012-10-27 NOTE — Telephone Encounter (Signed)
Returned call to patient appointment scheduled with DOD Dr.Cooper today 10/27/12 at 2:00 pm.

## 2012-10-27 NOTE — Progress Notes (Signed)
HPI:  67 year old woman presenting as an add-on for atrial flutter. She has been followed by Dr. Daleen Squibb. She has a history of paroxysmal atrial fibrillation.  Since April of this year, she having a lot of problems related to asthma. She complains of cough, shortness of breath, and wheezing. She tried to wean off of prednisone but this had to be increased to control symptoms. She thinks that she's been in atrial fibrillation/flutter for at least a few weeks. She feels her heart beating faster than normal. She denies chest pain, chest pressure, edema, orthopnea, or PND.  Outpatient Encounter Prescriptions as of 10/27/2012  Medication Sig Dispense Refill  . aspirin 81 MG tablet Take 81 mg by mouth daily.        [name] Bioflavonoid Products (BIOFLEX PO) Take 600 mg by mouth daily.      . Calcium Carbonate-Vitamin D (CALCIUM-VITAMIN D) 500-200 MG-UNIT per tablet Take 1 tablet by mouth daily.        [name] diltiazem (CARDIZEM CD) 300 MG 24 hr capsule Take 1 capsule (300 mg total) by mouth daily.  30 capsule  5  . doxycycline (VIBRA-TABS) 100 MG tablet Take 1 tablet (100 mg total) by mouth 2 (two) times daily.  10 tablet  0  . estradiol (VIVELLE-DOT) 0.075 MG/24HR Place 1 patch onto the skin 2 (two) times a week.  8 patch  12  . fluticasone (FLONASE) 50 MCG/ACT nasal spray Place 2 sprays into the nose daily.  16 g  11  . Fluticasone-Salmeterol (ADVAIR DISKUS) 250-50 MCG/DOSE AEPB Inhale 1 puff into the lungs 2 (two) times daily.  60 each  11  . levalbuterol (XOPENEX HFA) 45 MCG/ACT inhaler Inhale 1-2 puffs using inhaler every four hours as needed  1 Inhaler  6  . montelukast (SINGULAIR) 10 MG tablet Take 1 tablet (10 mg total) by mouth daily.  30 tablet  11  . predniSONE (DELTASONE) 10 MG tablet Take 1 tablet (10 mg total) by mouth daily.  30 tablet  0   No facility-administered encounter medications on file as of 10/27/2012.    Amoxicillin-pot clavulanate and Moxifloxacin  Past Medical History  Diagnosis  Date  . Pneumothorax   . Tobacco abuse   . Allergic rhinitis   . Oral candidiasis   . Atrial fibrillation   . Asthma   . 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 Arthritis   . Osteopenia 07/2011    T score -2.4 FRAX 9.2%/1.2%    Past Surgical History  Procedure Laterality Date  . Lung surgery  02/1965  . Cesarean section      X 2  . Appendectomy    . Total abdominal hysterectomy w/ bilateral salpingoophorectomy  1984    Endometriosis    History   Social History  . Marital Status: Married    Spouse Name: N/A    Number of Children: 2  . Years of Education: N/A   Occupational History  . 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     Social History Main Topics  . Smoking status: Former Smoker -- 0.50 packs/day for 20 years    Quit date: 03/04/2004  . Smokeless tobacco: Never Used  . Alcohol Use: 7.0 oz/week    14 drink(s) per week     Comment: social  . Drug Use: No  . Sexual Activity: Yes    Birth Control/ Protection: Surgical, Post-menopausal     Comment: HYST  Other Topics Concern  . Not on file   Social History Narrative  . No narrative on file    Family History  Problem Relation Age of Onset  . Stroke Father 44  . Hypertension Father   . Heart disease Father   . Diabetes Mother     ROS:  General: no fevers/chills/night sweats Eyes: no blurry vision, diplopia, or amaurosis ENT: no sore throat or hearing loss Resp: no hemoptysis; positive for cough, shortness of breath, wheezing CV: no edema or lightheadedness GI: no abdominal pain, nausea, vomiting, diarrhea, or constipation GU: no dysuria, frequency, or hematuria Skin: no rash Neuro: no headache, numbness, tingling, or weakness of extremities Musculoskeletal: no joint pain or swelling Heme: positive for easy bruising Endo: no polydipsia or polyuria  BP 130/62  Pulse 113  Ht 5\' 5"  (1.651 m)  Wt 152 lb (68.947 kg)  BMI 25.29 kg/m2  SpO2 97%  PHYSICAL EXAM: Pt is  alert and oriented, WD, WN, in no distress. HEENT: normal Neck: JVP normal. Carotid upstrokes normal without bruits. No thyromegaly. Lungs: equal expansion, expiratory rhonchi/wheezing CV: Apex is discrete and nondisplaced, irregularly irregular without murmur or gallop Abd: soft, NT, +BS, no bruit, no hepatosplenomegaly Back: no CVA tenderness Ext: no C/C/E        DP/PT pulses intact and = Skin: warm and dry without rash Neuro: CNII-XII intact             Strength intact = bilaterally  EKG:  Atrial flutter with variable AV block, heart rate 113 beats per minute.  2-D echo 08/05/2012: Left ventricle: The cavity size was normal. There was mild focal basal hypertrophy of the septum. Systolic function was normal. The estimated ejection fraction was in the range of 60% to 65%. Wall motion was normal; there were no regional wall motion abnormalities. Doppler parameters are consistent with abnormal left ventricular relaxation (grade 1 diastolic dysfunction).  ------------------------------------------------------------ Aortic valve: Structurally normal valve. Trileaflet. Cusp separation was normal. Doppler: Transvalvular velocity was within the normal range. There was no stenosis. No regurgitation.  ------------------------------------------------------------ Aorta: The aorta was normal, not dilated, and non-diseased.  ------------------------------------------------------------ Mitral valve: Structurally normal valve. Leaflet separation was normal. Doppler: Transvalvular velocity was within the normal range. There was no evidence for stenosis. Trivial regurgitation.  ------------------------------------------------------------ Left atrium: The atrium was normal in size.  ------------------------------------------------------------ Atrial septum: No defect or patent foramen ovale was identified.  ------------------------------------------------------------ Right ventricle:  The cavity size was normal. Wall thickness was normal. Systolic function was normal.  ------------------------------------------------------------ Pulmonic valve: Structurally normal valve. Cusp separation was normal. Doppler: Transvalvular velocity was within the normal range. No regurgitation.  ------------------------------------------------------------ Right atrium: The atrium was normal in size.  ------------------------------------------------------------ Pericardium: The pericardium was normal in appearance.  ------------------------------------------------------------ Systemic veins: Inferior vena cava: The vessel was normal in size; the respirophasic diameter changes were in the normal range (= 50%); findings are consistent with normal central venous pressure.  ------------------------------------------------------------ Post procedure conclusions Ascending Aorta:  - The aorta was normal, not dilated, and non-diseased.  ASSESSMENT AND PLAN: Atrial flutter with RVR. The patient is on Cardizem and despite this has elevated heart rates. This is probably driven by her pulmonary disease. We discussed potential treatment options. I do not think she is a good candidate for a beta blocker because of her reactive airway disease. Will increase Cardizem to 360 mg daily. Recommend start Xarelto 20 mg daily and stop aspirin. Her chads vasc score = 2 (age, female gender). I am going  to bring her back in a few weeks for a followup check and if she remains in atrial flutter, will proceed with elective cardioversion. She understands the plan and agrees. She has normal left ventricular function and no significant left ventricular hypertrophy. She probably would be a candidate for flecainide if she fails cardioversion without antiarrhythmic drug. We discussed the potential risks of starting anticoagulant drug. We also discussed consideration of warfarin versus a novel agent. After discussion of the  pros and cons, we elected for Xarelto.  Plan:   Xarelto 20 mg daily  Baseline labs today  Stop ASA  Increase cardizem to 360 mg daily  RTN 2-3 weeks with PA/NP  If still in Atrial Fib at that point, schedule elective DCCV  Tonny Bollman 10/27/2012 2:53 PM

## 2012-10-27 NOTE — Assessment & Plan Note (Signed)
Exacerbation -slow to resolve   CXR with no acute changes   Plan  Extend Doxycycline 100mg  Twice daily  For additional 5 days .  Mucinex DM Twice daily  As needed  Cough/congestion Prednisone 20mg  daily for 3 days ,then 10mg  daily for 3 days, then 5mg  daily for 3 days then back to taper recommended by family doctor.  We will refer you to your Cardilogist tomorrow to evaluate your persistent Atrial Fib.  Follow up Dr. Delford Field 4 weeks as planned and As needed

## 2012-10-29 ENCOUNTER — Encounter: Payer: Self-pay | Admitting: Cardiovascular Disease

## 2012-10-29 NOTE — Telephone Encounter (Signed)
New Problem ° °Pt returning call for results °

## 2012-10-29 NOTE — Telephone Encounter (Signed)
This encounter was created in error - please disregard.

## 2012-11-11 ENCOUNTER — Ambulatory Visit (INDEPENDENT_AMBULATORY_CARE_PROVIDER_SITE_OTHER): Payer: Medicare PPO | Admitting: Critical Care Medicine

## 2012-11-11 ENCOUNTER — Encounter: Payer: Self-pay | Admitting: Critical Care Medicine

## 2012-11-11 VITALS — BP 130/80 | HR 76 | Temp 97.2°F | Ht 65.0 in | Wt 154.8 lb

## 2012-11-11 DIAGNOSIS — J45901 Unspecified asthma with (acute) exacerbation: Secondary | ICD-10-CM

## 2012-11-11 DIAGNOSIS — J45902 Unspecified asthma with status asthmaticus: Secondary | ICD-10-CM

## 2012-11-11 MED ORDER — PREDNISONE 10 MG PO TABS: ORAL_TABLET | ORAL | Status: DC

## 2012-11-11 MED ORDER — BECLOMETHASONE DIPROPIONATE 80 MCG/ACT IN AERS: 2.0000 | INHALATION_SPRAY | Freq: Two times a day (BID) | RESPIRATORY_TRACT | Status: DC

## 2012-11-11 MED ORDER — METHYLPREDNISOLONE ACETATE 80 MG/ML IJ SUSP
120.0000 mg | Freq: Once | INTRAMUSCULAR | Status: AC
  Administered 2012-11-11: 120 mg via INTRAMUSCULAR

## 2012-11-11 MED ORDER — AZITHROMYCIN 250 MG PO TABS: 250.0000 mg | ORAL_TABLET | Freq: Every day | ORAL | Status: DC

## 2012-11-11 NOTE — Patient Instructions (Addendum)
Azithromycin 250mg  Take two once then one daily until gone Add Qvar two puff twice daily Stay on Advair  Reduce prednisone to one weekly for two weeks then stop A depomedrol 120mg  injection was given No other medication changes Return 2  week for recheck

## 2012-11-11 NOTE — Progress Notes (Signed)
Subjective:    Patient ID: Elizabeth Mcconnell, female    DOB: 12/08/1945, 67 y.o.   MRN: [id number]  HPI  67 yo WF with known hx of  Severe atopic asthma with extremely high IG levels, paroxysmal atrial fibrillation felt to be secondary to albuterol, allergic rhinitis,  Former smoker .  Prior history of recurrent spontaneous pneumothorax, with left lower lobe lobectomy.   11/11/2012 Chief Complaint  Patient presents with  . Follow-up    Feels like symptoms are mildly returning since being on low dose of prednisone.  C/o rattling, slight cough with yellow mucus this am, slight SOB, and little chest tightness.     Pt was on chronic pred 10mg  /d and slow taper April through august.  Pt then ill 8/14 20mg /d and slow taper.  Pt was on 5mg  qod pred when got ill 8/14 for two weeks. Pt starting feeling bad of low dose of pred: felt tired, washed out, no energy, weak.  20mg /d pred rx and doxy and this helped.  Then saw Tparrett and doxy extended and pred tapered 20 x 3  10/d x 3 then 5/d and hold   Pt also had PAF .   Pt saw cardiology, increased cardiazem to 360/d and put on blood thinner, Xarelto.   Now on pred 5mg  on Monday/thursday.  Not as washed out now.  Pt feels more congested and tight, cough and is productive sl yellow.  No sinus drainage.   Review of Systems  Constitutional:   No  weight loss, night sweats,  Fevers, chills, fatigue, or  lassitude.  HEENT:   No headaches,  Difficulty swallowing,  Tooth/dental problems, or  Sore throat,                No sneezing, itching, ear ache, nasal congestion, post nasal drip,   CV:  No chest pain,  Orthopnea, PND, swelling in lower extremities, anasarca, dizziness, palpitations, syncope.   GI  No heartburn, indigestion, abdominal pain, nausea, vomiting, diarrhea, change in bowel habits, loss of appetite, bloody stools.   Resp:   No chest wall deformity  Skin: no rash or lesions.  GU: no dysuria, change in color of urine, no urgency or  frequency.  No flank pain, no hematuria   MS:  No joint pain or swelling.  No decreased range of motion.  No back pain.  Psych:  No change in mood or affect. No depression or anxiety.  No memory loss.         Objective:   Physical Exam BP 130/80  Pulse 76  Temp(Src) 97.2 F (36.2 C) (Oral)  Ht 5\' 5"  (1.651 m)  Wt 154 lb 12.8 oz (70.217 kg)  BMI 25.76 kg/m2  SpO2 96%  GEN: A/Ox3; pleasant , NAD, well nourished   HEENT:  [place]/AT,  EACs-clear, TMs-wnl, NOSE-clear, THROAT-clear, no lesions, no postnasal drip or exudate noted.   NECK:  Supple w/ fair ROM; no JVD; normal carotid impulses w/o bruits; no thyromegaly or nodules palpated; no lymphadenopathy.  RESP  Few exp wheezes , no accessory muscle use, no dullness to percussion  CARD:  RRR, no m/r/g  , no peripheral edema, pulses intact, no cyanosis or clubbing.  GI:   Soft & nt; nml bowel sounds; no organomegaly or masses detected.  Musco: Warm bil, no deformities or joint swelling noted.   Neuro: alert, no focal deficits noted.    Skin: Warm, no lesions or rashes         Assessment &  Plan:

## 2012-11-12 NOTE — Assessment & Plan Note (Signed)
Recurrent asthma exacerbation d/t acute tracheobronchitis Plan Azithromycin 250mg  Take two once then one daily until gone Add Qvar two puff twice daily Stay on Advair  Reduce prednisone to one weekly for two weeks then stop A depomedrol 120mg  injection was given No other medication changes Return 2  week for recheck

## 2012-11-17 ENCOUNTER — Encounter: Payer: Self-pay | Admitting: Critical Care Medicine

## 2012-11-17 ENCOUNTER — Ambulatory Visit (INDEPENDENT_AMBULATORY_CARE_PROVIDER_SITE_OTHER): Payer: Medicare PPO | Admitting: Critical Care Medicine

## 2012-11-17 VITALS — BP 136/86 | HR 75 | Temp 98.0°F | Ht 65.0 in | Wt 153.0 lb

## 2012-11-17 DIAGNOSIS — J45909 Unspecified asthma, uncomplicated: Secondary | ICD-10-CM

## 2012-11-17 DIAGNOSIS — Z23 Encounter for immunization: Secondary | ICD-10-CM

## 2012-11-17 NOTE — Patient Instructions (Addendum)
Flu vaccine  Stay on Qvar through one refill Stay on Advair Return 4 months We will try to get you in with Dr Felicity Coyer

## 2012-11-17 NOTE — Progress Notes (Signed)
Subjective:    Patient ID: Elizabeth Mcconnell, female    DOB: [birth year], 67 y.o.   MRN: [id number]  HPI  67 yo WF with known hx of  Severe atopic asthma with extremely high IG levels, paroxysmal atrial fibrillation felt to be secondary to albuterol, allergic rhinitis,  Former smoker .  Prior history of recurrent spontaneous pneumothorax, with left lower lobe lobectomy.  11/17/2012 Chief Complaint  Patient presents with  . 1 wk follow up    Overall, feels like breathing has improved.  Does have PND, nonprod cough, and occas chest tightness. No SOB noted.      Azithromycin 250mg  Take two once then one daily until gone  Add Qvar two puff twice daily  Stay on Advair  Reduce prednisone to one weekly for two weeks then stop  A depomedrol 120mg  injection was given  Now is better.  The pred is finished as of one day ago. Now not much cough.  Now is on advair plus Qvar   Review of Systems  Constitutional:   No  weight loss, night sweats,  Fevers, chills, fatigue, or  lassitude.  HEENT:   No headaches,  Difficulty swallowing,  Tooth/dental problems, or  Sore throat,                No sneezing, itching, ear ache, nasal congestion, post nasal drip,   CV:  No chest pain,  Orthopnea, PND, swelling in lower extremities, anasarca, dizziness, palpitations, syncope.   GI  No heartburn, indigestion, abdominal pain, nausea, vomiting, diarrhea, change in bowel habits, loss of appetite, bloody stools.   Resp:   No chest wall deformity  Skin: no rash or lesions.  GU: no dysuria, change in color of urine, no urgency or frequency.  No flank pain, no hematuria   MS:  No joint pain or swelling.  No decreased range of motion.  No back pain.  Psych:  No change in mood or affect. No depression or anxiety.  No memory loss.         Objective:   Physical Exam BP 136/86  Pulse 75  Temp(Src) 98 F (36.7 C) (Oral)  Ht 5\' 5"  (1.651 m)  Wt 153 lb (69.4 kg)  BMI 25.46 kg/m2  SpO2 97%  GEN:  A/Ox3; pleasant , NAD, well nourished   HEENT:  [place]/AT,  EACs-clear, TMs-wnl, NOSE-clear, THROAT-clear, no lesions, no postnasal drip or exudate noted.   NECK:  Supple w/ fair ROM; no JVD; normal carotid impulses w/o bruits; no thyromegaly or nodules palpated; no lymphadenopathy.  RESP clearer , no accessory muscle use, no dullness to percussion  CARD:  RRR, no m/r/g  , no peripheral edema, pulses intact, no cyanosis or clubbing.  GI:   Soft & nt; nml bowel sounds; no organomegaly or masses detected.  Musco: Warm bil, no deformities or joint swelling noted.   Neuro: alert, no focal deficits noted.    Skin: Warm, no lesions or rashes         Assessment & Plan:   Moderate persistent asthma with significant atopic features Mod persistent asthma improved Plan Flu vaccine  Stay on Qvar through one refill Stay on Advair Return 4 months    Updated Medication List Outpatient Encounter Prescriptions as of 11/17/2012  Medication Sig Dispense Refill  . amitriptyline (ELAVIL) 10 MG tablet Take 1 tablet by mouth at bedtime.      . beclomethasone (QVAR) 80 MCG/ACT inhaler Inhale 2 puffs into the lungs 2 (two) times daily.  1 Inhaler  6  . Bioflavonoid Products (BIOFLEX PO) Take 600 mg by mouth daily.      . Calcium Carbonate-Vitamin D (CALCIUM-VITAMIN D) 500-200 MG-UNIT per tablet Take 1 tablet by mouth daily.        [name] diltiazem (CARDIZEM CD) 360 MG 24 hr capsule Take 1 capsule (360 mg total) by mouth daily.  30 capsule  5  . estradiol (VIVELLE-DOT) 0.075 MG/24HR Place 1 patch onto the skin 2 (two) times a week.  8 patch  12  . fluticasone (FLONASE) 50 MCG/ACT nasal spray Place 2 sprays into the nose daily.  16 g  11  . Fluticasone-Salmeterol (ADVAIR DISKUS) 250-50 MCG/DOSE AEPB Inhale 1 puff into the lungs 2 (two) times daily.  60 each  11  . levalbuterol (XOPENEX HFA) 45 MCG/ACT inhaler Inhale 1-2 puffs using inhaler every four hours as needed  1 Inhaler  6  . montelukast (SINGULAIR)  10 MG tablet Take 1 tablet (10 mg total) by mouth daily.  30 tablet  11  . Rivaroxaban (XARELTO) 20 MG TABS tablet Take 1 tablet (20 mg total) by mouth daily with supper.  30 tablet  5  . [DISCONTINUED] azithromycin (ZITHROMAX) 250 MG tablet Take 1 tablet (250 mg total) by mouth daily. Take two once then one daily until gone  6 each  0  . [DISCONTINUED] predniSONE (DELTASONE) 10 MG tablet REduce prednisone to 5 mg weekly for two weeks then STOP       No facility-administered encounter medications on file as of 11/17/2012.

## 2012-11-18 ENCOUNTER — Ambulatory Visit (INDEPENDENT_AMBULATORY_CARE_PROVIDER_SITE_OTHER): Payer: Medicare PPO | Admitting: Nurse Practitioner

## 2012-11-18 ENCOUNTER — Other Ambulatory Visit: Payer: Self-pay | Admitting: Nurse Practitioner

## 2012-11-18 ENCOUNTER — Encounter: Payer: Self-pay | Admitting: Nurse Practitioner

## 2012-11-18 VITALS — BP 132/68 | HR 93 | Ht 65.0 in | Wt 152.0 lb

## 2012-11-18 DIAGNOSIS — I4891 Unspecified atrial fibrillation: Secondary | ICD-10-CM

## 2012-11-18 DIAGNOSIS — I480 Paroxysmal atrial fibrillation: Secondary | ICD-10-CM

## 2012-11-18 LAB — BASIC METABOLIC PANEL
BUN: 17 mg/dL (ref 6–23)
CO2: 25 mEq/L (ref 19–32)
Calcium: 9.4 mg/dL (ref 8.4–10.5)
Chloride: 105 mEq/L (ref 96–112)
Creatinine, Ser: 0.8 mg/dL (ref 0.4–1.2)
GFR: 72.41 mL/min (ref >60.00)
Glucose, Bld: 109 mg/dL — ABNORMAL HIGH (ref 70–99)
Potassium: 3.9 mEq/L (ref 3.5–5.1)
Sodium: 137 mEq/L (ref 135–145)

## 2012-11-18 LAB — CBC WITH DIFFERENTIAL/PLATELET
Basophils Absolute: 0 10*3/uL (ref 0.0–0.1)
Basophils Relative: 0.4 % (ref 0.0–3.0)
Eosinophils Absolute: 0 10*3/uL (ref 0.0–0.7)
Eosinophils Relative: 0.4 % (ref 0.0–5.0)
HCT: 42.9 % (ref 36.0–46.0)
Hemoglobin: 14.4 g/dL (ref 12.0–15.0)
Lymphocytes Relative: 15.1 % (ref 12.0–46.0)
Lymphs Abs: 1.6 10*3/uL (ref 0.7–4.0)
MCHC: 33.6 g/dL (ref 30.0–36.0)
MCV: 88.6 fl (ref 78.0–100.0)
Monocytes Absolute: 0.6 10*3/uL (ref 0.1–1.0)
Monocytes Relative: 5.3 % (ref 3.0–12.0)
Neutro Abs: 8.3 10*3/uL — ABNORMAL HIGH (ref 1.4–7.7)
Neutrophils Relative %: 78.8 % — ABNORMAL HIGH (ref 43.0–77.0)
Platelets: 206 10*3/uL (ref 150.0–400.0)
RBC: 4.84 Mil/uL (ref 3.87–5.11)
RDW: 14 % (ref 11.5–14.6)
WBC: 10.6 10*3/uL — ABNORMAL HIGH (ref 4.5–10.5)

## 2012-11-18 LAB — APTT: aPTT: 37.7 s — ABNORMAL HIGH (ref 21.7–28.8)

## 2012-11-18 LAB — PROTIME-INR
INR: 1.8 ratio — ABNORMAL HIGH (ref 0.8–1.0)
Prothrombin Time: 18.4 s — ABNORMAL HIGH (ref 10.2–12.4)

## 2012-11-18 NOTE — Patient Instructions (Addendum)
Stay on your current medicines  We need to check labs today  We will plan for cardioversion next Friday, September 26th  You are scheduled for a cardioversion on Friday, September 26th at 8:30am with Dr. Elease Hashimoto or associates. Please go to [facility] 2nd Floor Short Stay at 7am.  Enter through the [facility] Do not have any food or drink after midnight on Thursday.  You may take your medicines with a sip of water on the day of your procedure.  You will need someone to drive you home following your procedure.   Call the [facility] office at [phone] if you have any questions, problems or concerns.   Electrical Cardioversion Cardioversion is the delivery of a jolt of electricity to change the rhythm of the heart. Sticky patches or metal paddles are placed on the chest to deliver the electricity from a special device. This is done to restore a normal rhythm. A rhythm that is too fast or not regular keeps the heart from pumping well. Compared to medicines used to change an abnormal rhythm, cardioversion is faster and works better. It is also unpleasant and may dislodge blood clots from the heart. WHEN WOULD THIS BE DONE?  In an emergency:  There is low or no blood pressure as a result of the heart rhythm.  Normal rhythm must be restored as fast as possible to protect the brain and heart from further damage.  It may save a life.  For less serious heart rhythms, such as atrial fibrillation or flutter, in which:  The heart is beating too fast or is not regular.  The heart is still able to pump enough blood, but not as well as it should.  Medicine to change the rhythm has not worked.  It is safe to wait in order to allow time for preparation. LET YOUR CAREGIVER KNOW ABOUT:   Every medicine you are taking. It is very important to do this! Know when to take or stop taking any of them.  Any time in the past that you have felt your heart was  not beating normally. RISKS AND COMPLICATIONS   Clots may form in the chambers of the heart if it is beating too fast. These clots may be dislodged during the procedure and travel to other parts of the body.  There is risk of a stroke during and after the procedure if a clot moves. Blood thinners lower this risk.  You may have a special test of your heart (TEE) to make sure there are no clots in your heart. BEFORE THE PROCEDURE   You may have some tests to see how well your heart is working.  You may start taking blood thinners so your blood does not clot as easily.  Other drugs may be given to help your heart work better. PROCEDURE (SCHEDULED)  The procedure is typically done in a hospital by a heart doctor (cardiologist).  You will be told when and where to go.  You may be given some medicine through an intravenous (IV) access to reduce discomfort and make you sleepy before the procedure.  Your whole body may move when the shock is delivered. Your chest may feel sore.  You may be able to go home after a few hours. Your heart rhythm will be watched to make sure it does not change. HOME CARE INSTRUCTIONS   Only take medicine as directed by your caregiver. Be sure you understand how and when to  take your medicine.  Learn how to feel your pulse and check it often.  Limit your activity for 48 hours.  Avoid caffeine and other stimulants as directed. SEEK MEDICAL CARE IF:   You feel like your heart is beating too fast or your pulse is not regular.  You have any questions about your medicines.  You have bleeding that will not stop. SEEK IMMEDIATE MEDICAL CARE IF:   You are dizzy or feel faint.  It is hard to breathe or you feel short of breath.  There is a change in discomfort in your chest.  Your speech is slurred or you have trouble moving your arm or leg on one side.  You get a muscle cramp.  Your fingers or toes turn cold or blue. MAKE SURE YOU:   Understand  these instructions.  Will watch your condition.  Will get help right away if you are not doing well or get worse. Document Released: 02/08/2002 Document Revised: 05/13/2011 Document Reviewed: 06/10/2007 [facility] Patient Information 2014 [place], [place].

## 2012-11-18 NOTE — Assessment & Plan Note (Signed)
Mod persistent asthma improved Plan Flu vaccine  Stay on Qvar through one refill Stay on Advair Return 4 months

## 2012-11-18 NOTE — Progress Notes (Signed)
Elizabeth Mcconnell Date of Birth: 1945/05/11 Medical Record #[id number]  History of Present Illness: Elizabeth Mcconnell is seen back today for a 3 week check. Seen for Dr. Excell Seltzer. Former patient of Dr. Daleen Squibb and is to be establishing her care with Dr. [place]. She has a history of PAF (usually triggered by her asthma attacks), past pneumothorax, tobacco abuse, severe atopic asthma, and allergic rhinitis.   Seen as an add on 3 weeks ago - noted to be back in atrial fib. She had had an asthma attack - was short of breath, coughing and wheezing. On prednisone to control her symptoms. Cardizem was increased, she was started on Xarelto, aspirin was stopped. She has normal LV function and no significant LVH, no known CAD. Plan was to cardiovert if she remained out of rhythm. Flecainide could be considered if failed cardioversion. Not felt to be a good candidate for beta blocker therapy given her reactive airway disease.   Comes back today. Here alone. Doing ok. Remains out of rhythm. She is feeling much better. Not as short of breath. Still with some palpitations but not dizzy or lightheaded. No chest pain. Has been compliant with her Xarelto - no missed doses - does take with her breakfast and it is an actual meal.   Current Outpatient Prescriptions  Medication Sig Dispense Refill  . amitriptyline (ELAVIL) 10 MG tablet Take 1 tablet by mouth at bedtime.      . beclomethasone (QVAR) 80 MCG/ACT inhaler Inhale 2 puffs into the lungs 2 (two) times daily.  1 Inhaler  6  . Bioflavonoid Products (BIOFLEX PO) Take 600 mg by mouth daily.      . Calcium Carbonate-Vitamin D (CALCIUM-VITAMIN D) 500-200 MG-UNIT per tablet Take 1 tablet by mouth daily.        [name] diltiazem (CARDIZEM CD) 360 MG 24 hr capsule Take 1 capsule (360 mg total) by mouth daily.  30 capsule  5  . estradiol (VIVELLE-DOT) 0.075 MG/24HR Place 1 patch onto the skin 2 (two) times a week.  8 patch  12  . fluticasone (FLONASE) 50 MCG/ACT nasal spray Place  2 sprays into the nose daily.  16 g  11  . Fluticasone-Salmeterol (ADVAIR DISKUS) 250-50 MCG/DOSE AEPB Inhale 1 puff into the lungs 2 (two) times daily.  60 each  11  . levalbuterol (XOPENEX HFA) 45 MCG/ACT inhaler Inhale 1-2 puffs using inhaler every four hours as needed  1 Inhaler  6  . montelukast (SINGULAIR) 10 MG tablet Take 1 tablet (10 mg total) by mouth daily.  30 tablet  11  . Rivaroxaban (XARELTO) 20 MG TABS tablet Take 1 tablet (20 mg total) by mouth daily with supper.  30 tablet  5   No current facility-administered medications for this visit.    Allergies  Allergen Reactions  . Amoxicillin-Pot Clavulanate     Emesis, nausea  . Moxifloxacin     REACTION: rash    Past Medical History  Diagnosis Date  . Pneumothorax   . Tobacco abuse   . Allergic rhinitis   . Oral candidiasis   . Atrial fibrillation   . Asthma   . 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 Arthritis   . Osteopenia 07/2011    T score -2.4 FRAX 9.2%/1.2%    Past Surgical History  Procedure Laterality Date  . Lung surgery  02/1965  . Cesarean section      X 2  . Appendectomy    .  Total abdominal hysterectomy w/ bilateral salpingoophorectomy  1984    Endometriosis    History  Smoking status  . Former Smoker -- 0.50 packs/day for 20 years  . Quit date: 03/04/2004  Smokeless tobacco  . Never Used    History  Alcohol Use  . 7.0 oz/week  . 14 drink(s) per week    Comment: social    Family History  Problem Relation Age of Onset  . Stroke Father 46  . Hypertension Father   . Heart disease Father   . Diabetes Mother     Review of Systems: The review of systems is per the HPI.  All other systems were reviewed and are negative.  Physical Exam: BP 132/68  Pulse 93  Ht 5\' 5"  (1.651 m)  Wt 152 lb (68.947 kg)  BMI 25.29 kg/m2 Patient is very pleasant and in no acute distress. Skin is warm and dry. Color is normal.  HEENT is unremarkable.  Normocephalic/atraumatic. PERRL. Sclera are nonicteric. Neck is supple. No masses. No JVD. Lungs are clear. Cardiac exam shows an irregular rhythm. Rate is ok. Abdomen is soft. Extremities are without edema. Gait and ROM are intact. No gross neurologic deficits noted.  LABORATORY DATA: EKG today shows atrial flutter with varriable block, RBBB.   Lab Results  Component Value Date   WBC 14.6* 10/27/2012   HGB 15.0 10/27/2012   HCT 44.2 10/27/2012   PLT 251.0 10/27/2012   GLUCOSE 88 10/27/2012   ALT 23 10/27/2012   AST 17 10/27/2012   NA 138 10/27/2012   K 4.4 10/27/2012   CL 103 10/27/2012   CREATININE 1.0 10/27/2012   BUN 20 10/27/2012   CO2 27 10/27/2012   TSH 1.21 08/08/2008    Echo Study Conclusions from June 2014  - Left ventricle: The cavity size was normal. There was mild focal basal hypertrophy of the septum. Systolic function was normal. The estimated ejection fraction was in the range of 60% to 65%. Wall motion was normal; there were no regional wall motion abnormalities. Doppler parameters are consistent with abnormal left ventricular relaxation (grade 1 diastolic dysfunction). - Atrial septum: No defect or patent foramen ovale was identified.   Assessment / Plan: 1. PAF - remains out of rhythm. In fluttter/fib today - has been anticoagulated for 3 weeks - will plan for cardioversion next week - she is agreeable to proceeding. The procedure has been reviewed in detail and she is willing to proceed. Scheduled for next Friday, September 26th.   2. Anticoagulation - no missed doses of Xarelto. Will be checking follow up labs today.   3. Asthma - recent exacerbation - improved.   Patient is agreeable to this plan and will call if any problems develop in the interim.   Rosalio Macadamia, RN, ANP-C [facility] [address] [place], [place]  [address]

## 2012-11-20 ENCOUNTER — Telehealth: Payer: Self-pay | Admitting: Cardiovascular Disease

## 2012-11-20 NOTE — Telephone Encounter (Signed)
New problem    Returning call back to nurse.   

## 2012-11-23 ENCOUNTER — Encounter (HOSPITAL_COMMUNITY): Payer: Self-pay | Admitting: Pharmacy Technician

## 2012-11-26 MED ORDER — DEXTROSE-NACL 5-0.45 % IV SOLN: INTRAVENOUS | Status: DC

## 2012-11-27 ENCOUNTER — Encounter (HOSPITAL_COMMUNITY): Payer: Self-pay | Admitting: Anesthesiology

## 2012-11-27 ENCOUNTER — Encounter (HOSPITAL_COMMUNITY)
Admission: RE | Disposition: A | Discharge status: Home / Self Care | Payer: Self-pay | Source: Ambulatory Visit | Attending: Cardiovascular Disease

## 2012-11-27 ENCOUNTER — Ambulatory Visit (HOSPITAL_COMMUNITY)
Admission: RE | Admit: 2012-11-27 | Discharge: 2012-11-27 | Disposition: A | Discharge status: Home / Self Care | Payer: Medicare PPO | Source: Ambulatory Visit | Attending: Cardiovascular Disease | Admitting: Cardiovascular Disease

## 2012-11-27 ENCOUNTER — Ambulatory Visit (HOSPITAL_COMMUNITY): Payer: Medicare PPO | Admitting: Anesthesiology

## 2012-11-27 DIAGNOSIS — I4891 Unspecified atrial fibrillation: Secondary | ICD-10-CM | POA: Insufficient documentation

## 2012-11-27 DIAGNOSIS — I4892 Unspecified atrial flutter: Secondary | ICD-10-CM | POA: Insufficient documentation

## 2012-11-27 DIAGNOSIS — Z7901 Long term (current) use of anticoagulants: Secondary | ICD-10-CM | POA: Insufficient documentation

## 2012-11-27 HISTORY — PX: CARDIOVERSION: SHX1299

## 2012-11-27 SURGERY — CARDIOVERSION
Anesthesia: General | Wound class: Clean

## 2012-11-27 MED ORDER — SODIUM CHLORIDE 0.9 % IV SOLN
INTRAVENOUS | Status: DC
  Administered 2012-11-27: 08:00:00 via INTRAVENOUS

## 2012-11-27 MED ORDER — SODIUM CHLORIDE 0.9 % IV SOLN
INTRAVENOUS | Status: DC | PRN
  Administered 2012-11-27: 08:00:00 via INTRAVENOUS

## 2012-11-27 MED ORDER — PROPOFOL 10 MG/ML IV BOLUS
INTRAVENOUS | Status: DC | PRN
  Administered 2012-11-27: 60 mg via INTRAVENOUS

## 2012-11-27 MED ORDER — DIGOXIN 250 MCG PO TABS: 0.2500 mg | ORAL_TABLET | Freq: Every day | ORAL | Status: DC

## 2012-11-27 NOTE — H&P (View-Only) (Signed)
 Elizabeth Mcconnell Date of Birth: 06/28/1945 Medical Record #[id number]  History of Present Illness: Elizabeth Mcconnell is seen back today for a 3 week check. Seen for Dr. Cooper. Former patient of Dr. Wall and is to be establishing her care with Dr. Jordan. She has a history of PAF (usually triggered by her asthma attacks), past pneumothorax, tobacco abuse, severe atopic asthma, and allergic rhinitis.   Seen as an add on 3 weeks ago - noted to be back in atrial fib. She had had an asthma attack - was short of breath, coughing and wheezing. On prednisone to control her symptoms. Cardizem was increased, she was started on Xarelto, aspirin was stopped. She has normal LV function and no significant LVH, no known CAD. Plan was to cardiovert if she remained out of rhythm. Flecainide could be considered if failed cardioversion. Not felt to be a good candidate for beta blocker therapy given her reactive airway disease.   Comes back today. Here alone. Doing ok. Remains out of rhythm. She is feeling much better. Not as short of breath. Still with some palpitations but not dizzy or lightheaded. No chest pain. Has been compliant with her Xarelto - no missed doses - does take with her breakfast and it is an actual meal.   Current Outpatient Prescriptions  Medication Sig Dispense Refill  . amitriptyline (ELAVIL) 10 MG tablet Take 1 tablet by mouth at bedtime.      . beclomethasone (QVAR) 80 MCG/ACT inhaler Inhale 2 puffs into the lungs 2 (two) times daily.  1 Inhaler  6  . Bioflavonoid Products (BIOFLEX PO) Take 600 mg by mouth daily.      . Calcium Carbonate-Vitamin D (CALCIUM-VITAMIN D) 500-200 MG-UNIT per tablet Take 1 tablet by mouth daily.        . diltiazem (CARDIZEM CD) 360 MG 24 hr capsule Take 1 capsule (360 mg total) by mouth daily.  30 capsule  5  . estradiol (VIVELLE-DOT) 0.075 MG/24HR Place 1 patch onto the skin 2 (two) times a week.  8 patch  12  . fluticasone (FLONASE) 50 MCG/ACT nasal spray Place  2 sprays into the nose daily.  16 g  11  . Fluticasone-Salmeterol (ADVAIR DISKUS) 250-50 MCG/DOSE AEPB Inhale 1 puff into the lungs 2 (two) times daily.  60 each  11  . levalbuterol (XOPENEX HFA) 45 MCG/ACT inhaler Inhale 1-2 puffs using inhaler every four hours as needed  1 Inhaler  6  . montelukast (SINGULAIR) 10 MG tablet Take 1 tablet (10 mg total) by mouth daily.  30 tablet  11  . Rivaroxaban (XARELTO) 20 MG TABS tablet Take 1 tablet (20 mg total) by mouth daily with supper.  30 tablet  5   No current facility-administered medications for this visit.    Allergies  Allergen Reactions  . Amoxicillin-Pot Clavulanate     Emesis, nausea  . Moxifloxacin     REACTION: rash    Past Medical History  Diagnosis Date  . Pneumothorax   . Tobacco abuse   . Allergic rhinitis   . Oral candidiasis   . Atrial fibrillation   . Asthma   . 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 Arthritis   . Osteopenia 07/2011    T score -2.4 FRAX 9.2%/1.2%    Past Surgical History  Procedure Laterality Date  . Lung surgery  02/1965  . Cesarean section      X 2  . Appendectomy    .   Total abdominal hysterectomy w/ bilateral salpingoophorectomy  1984    Endometriosis    History  Smoking status  . Former Smoker -- 0.50 packs/day for 20 years  . Quit date: 03/04/2004  Smokeless tobacco  . Never Used    History  Alcohol Use  . 7.0 oz/week  . 14 drink(s) per week    Comment: social    Family History  Problem Relation Age of Onset  . Stroke Father 50  . Hypertension Father   . Heart disease Father   . Diabetes Mother     Review of Systems: The review of systems is per the HPI.  All other systems were reviewed and are negative.  Physical Exam: BP 132/68  Pulse 93  Ht 5' 5" (1.651 m)  Wt 152 lb (68.947 kg)  BMI 25.29 kg/m2 Patient is very pleasant and in no acute distress. Skin is warm and dry. Color is normal.  HEENT is unremarkable.  Normocephalic/atraumatic. PERRL. Sclera are nonicteric. Neck is supple. No masses. No JVD. Lungs are clear. Cardiac exam shows an irregular rhythm. Rate is ok. Abdomen is soft. Extremities are without edema. Gait and ROM are intact. No gross neurologic deficits noted.  LABORATORY DATA: EKG today shows atrial flutter with varriable block, RBBB.   Lab Results  Component Value Date   WBC 14.6* 10/27/2012   HGB 15.0 10/27/2012   HCT 44.2 10/27/2012   PLT 251.0 10/27/2012   GLUCOSE 88 10/27/2012   ALT 23 10/27/2012   AST 17 10/27/2012   NA 138 10/27/2012   K 4.4 10/27/2012   CL 103 10/27/2012   CREATININE 1.0 10/27/2012   BUN 20 10/27/2012   CO2 27 10/27/2012   TSH 1.21 08/08/2008    Echo Study Conclusions from June 2014  - Left ventricle: The cavity size was normal. There was mild focal basal hypertrophy of the septum. Systolic function was normal. The estimated ejection fraction was in the range of 60% to 65%. Wall motion was normal; there were no regional wall motion abnormalities. Doppler parameters are consistent with abnormal left ventricular relaxation (grade 1 diastolic dysfunction). - Atrial septum: No defect or patent foramen ovale was identified.   Assessment / Plan: 1. PAF - remains out of rhythm. In fluttter/fib today - has been anticoagulated for 3 weeks - will plan for cardioversion next week - she is agreeable to proceeding. The procedure has been reviewed in detail and she is willing to proceed. Scheduled for next Friday, September 26th.   2. Anticoagulation - no missed doses of Xarelto. Will be checking follow up labs today.   3. Asthma - recent exacerbation - improved.   Patient is agreeable to this plan and will call if any problems develop in the interim.   Jourdan Durbin C. Suvi Archuletta, RN, ANP-C [facility] [address] [place], [place]  [address]  

## 2012-11-27 NOTE — Transfer of Care (Signed)
Immediate Anesthesia Transfer of Care Note  Patient: Elizabeth Mcconnell  Procedure(s) Performed: Procedure(s): CARDIOVERSION (N/A)  Patient Location: PACU and Endoscopy Unit  Anesthesia Type:General  Level of Consciousness: awake, alert , oriented and patient cooperative  Airway & Oxygen Therapy: Patient Spontanous Breathing and Patient connected to nasal cannula oxygen  Post-op Assessment: Report given to PACU RN, Post -op Vital signs reviewed and stable and Patient moving all extremities  Post vital signs: Reviewed and stable  Complications: No apparent anesthesia complications

## 2012-11-27 NOTE — Anesthesia Postprocedure Evaluation (Signed)
  Anesthesia Post-op Note  Patient: Elizabeth Mcconnell  Procedure(s) Performed: Procedure(s): CARDIOVERSION (N/A)  Patient Location: Endoscopy Unit  Anesthesia Type:General  Level of Consciousness: awake, alert , oriented and patient cooperative  Airway and Oxygen Therapy: Patient Spontanous Breathing  Post-op Pain: none  Post-op Assessment: Post-op Vital signs reviewed, Patient's Cardiovascular Status Stable, Respiratory Function Stable, Patent Airway, No signs of Nausea or vomiting and Pain level controlled  Post-op Vital Signs: stable  Complications: No apparent anesthesia complications

## 2012-11-27 NOTE — CV Procedure (Signed)
    Cardioversion Note  Elizabeth Mcconnell [id number] [birth year]  Procedure: DC Cardioversion Indications: atrial fib / flutter  Procedure Details Consent: Obtained Time Out: Verified patient identification, verified procedure, site/side was marked, verified correct patient position, special equipment/implants available, Radiology Safety Procedures followed,  medications/allergies/relevent history reviewed, required imaging and test results available.  Performed  The patient has been on adequate anticoagulation.  The patient received IV Lidocaine 40 mg  Then  Propofol 60 mg  for sedation.  Synchronous cardioversion was performed at 20,150,200 joules.  The cardioversion unsuccessful .  We were able to transiently cardiovert her to NSR but she converted back to A-Fib / Flutter with in 30 seconds.   We shocked her 3 times in an effort to cardiovert her.    Complications: No apparent complications Patient did tolerate procedure well.   Vesta Mixer, Montez Hageman., MD, [facility] 11/27/2012, 8:30 AM

## 2012-11-27 NOTE — Anesthesia Postprocedure Evaluation (Signed)
  Anesthesia Post-op Note  Patient: Elizabeth Mcconnell  Procedure(s) Performed: Procedure(s): CARDIOVERSION (N/A)  Patient Location: PACU and Endoscopy Unit  Anesthesia Type:General  Level of Consciousness: awake, alert , oriented and patient cooperative  Airway and Oxygen Therapy: Patient Spontanous Breathing and Patient connected to nasal cannula oxygen  Post-op Pain: none  Post-op Assessment: Post-op Vital signs reviewed, Patient's Cardiovascular Status Stable, Respiratory Function Stable, Patent Airway and No signs of Nausea or vomiting  Post-op Vital Signs: Reviewed and stable  Complications: No apparent anesthesia complications

## 2012-11-27 NOTE — Anesthesia Preprocedure Evaluation (Addendum)
Anesthesia Evaluation  Patient identified by MRN, date of birth, ID band Patient awake    Reviewed: Allergy & Precautions, H&P , NPO status , Patient's Chart, lab work & pertinent test results  Airway Mallampati: I      Dental  (+) Teeth Intact and Dental Advisory Given   Pulmonary asthma , former smoker,          Cardiovascular + dysrhythmias Atrial Fibrillation Rhythm:Irregular Rate:Normal     Neuro/Psych Anxiety    GI/Hepatic   Endo/Other    Renal/GU      Musculoskeletal   Abdominal   Peds  Hematology   Anesthesia Other Findings   Reproductive/Obstetrics                          Anesthesia Physical Anesthesia Plan  ASA: III  Anesthesia Plan: General   Post-op Pain Management:    Induction: Intravenous  Airway Management Planned: Mask  Additional Equipment:   Intra-op Plan:   Post-operative Plan:   Informed Consent: I have reviewed the patients History and Physical, chart, labs and discussed the procedure including the risks, benefits and alternatives for the proposed anesthesia with the patient or authorized representative who has indicated his/her understanding and acceptance.     Plan Discussed with: CRNA, Anesthesiologist and Surgeon  Anesthesia Plan Comments:         Anesthesia Quick Evaluation

## 2012-11-27 NOTE — Interval H&P Note (Signed)
History and Physical Interval Note:  11/27/2012 8:24 AM  [name]  has presented today for surgery, with the diagnosis of A FLUTTER/A FIB   The various methods of treatment have been discussed with the patient and family. After consideration of risks, benefits and other options for treatment, the patient has consented to  Procedure(s): CARDIOVERSION (N/A) as a surgical intervention .  The patient's history has been reviewed, patient examined, no change in status, stable for surgery.  I have reviewed the patient's chart and labs.  Questions were answered to the patient's satisfaction.     Elyn Aquas.

## 2012-11-30 ENCOUNTER — Telehealth: Payer: Self-pay | Admitting: *Deleted

## 2012-11-30 ENCOUNTER — Encounter (HOSPITAL_COMMUNITY): Payer: Self-pay | Admitting: Cardiovascular Disease

## 2012-11-30 NOTE — Telephone Encounter (Signed)
S/w pt agreeable to come in on Wednesday, October 1st and to hold medication for repeat lab work per [employer]

## 2012-12-02 ENCOUNTER — Encounter: Payer: Self-pay | Admitting: Nurse Practitioner

## 2012-12-02 ENCOUNTER — Ambulatory Visit (INDEPENDENT_AMBULATORY_CARE_PROVIDER_SITE_OTHER): Payer: Medicare PPO | Admitting: Nurse Practitioner

## 2012-12-02 VITALS — BP 140/72 | HR 80 | Ht 65.0 in | Wt 150.8 lb

## 2012-12-02 DIAGNOSIS — I4891 Unspecified atrial fibrillation: Secondary | ICD-10-CM

## 2012-12-02 LAB — BASIC METABOLIC PANEL
BUN: 15 mg/dL (ref 6–23)
CO2: 29 mEq/L (ref 19–32)
Calcium: 9.6 mg/dL (ref 8.4–10.5)
Chloride: 105 mEq/L (ref 96–112)
Creatinine, Ser: 0.9 mg/dL (ref 0.4–1.2)
GFR: 65.95 mL/min (ref >60.00)
Glucose, Bld: 89 mg/dL (ref 70–99)
Potassium: 3.8 mEq/L (ref 3.5–5.1)
Sodium: 140 mEq/L (ref 135–145)

## 2012-12-02 LAB — MAGNESIUM: Magnesium: 2 mg/dL (ref 1.5–2.5)

## 2012-12-02 MED ORDER — FLECAINIDE ACETATE 50 MG PO TABS: 50.0000 mg | ORAL_TABLET | Freq: Two times a day (BID) | ORAL | Status: DC

## 2012-12-02 NOTE — Patient Instructions (Addendum)
Stop the Digoxin  Start Flecainide 50 mg two times a day  We will arrange for a GXT in about 10 days  We need to check labs today  Call the [facility] office at [phone] if you have any questions, problems or concerns.

## 2012-12-02 NOTE — Progress Notes (Signed)
Elizabeth Mcconnell Date of Birth: [birth year] Medical Record #[id number]  History of Present Illness: Elizabeth Mcconnell is seen back today for a post cardioversion visit. Seen for Dr. Excell Seltzer. Was a former patient of Dr. Vern Claude and was to establish with Dr. [place]. Has a history of PAF (usually triggered by her asthma attacks), past pneumothorax, tobacco abuse, severe atopic asthma and allergic rhinitis.   Has had recurrent atrial fib - again in the setting of an asthma attack - did not convert back on her on. Placed on Xarelto and cardioversion was arranged. Not felt to be a good candidate for beta blocker given her reactive airway disease. Flecainide could be considered as an option according to Dr. Earmon Phoenix past note. No known CAD noted.   Had cardioversion attempted on 11/27/12 - unsuccessful after 3 shocks - digoxin was started to help with rate control.   Comes back today. Here alone. Doing ok. Felt really good for several days after her procedure - then was awakened early Monday am with her heart pounding. Got jittery. Felt this off and on most of Monday and was better by the end of Monday and has done ok since. No chest pain. Anxious to get back to her regular activities.   Current Outpatient Prescriptions  Medication Sig Dispense Refill  . acetaminophen (TYLENOL) 500 MG tablet Take 500 mg by mouth every 6 (six) hours as needed for pain.      [name] amitriptyline (ELAVIL) 10 MG tablet Take 10 mg by mouth at bedtime.       . beclomethasone (QVAR) 80 MCG/ACT inhaler Inhale 2 puffs into the lungs 2 (two) times daily.      . Calcium Carbonate-Vitamin D (CALCIUM-VITAMIN D) 500-200 MG-UNIT per tablet Take 1 tablet by mouth daily.        . cholecalciferol (VITAMIN D) 1000 UNITS tablet Take 1,000 Units by mouth daily.      . digoxin (LANOXIN) 0.25 MG tablet Take 1 tablet (0.25 mg total) by mouth daily.  30 tablet  3  . diltiazem (CARDIZEM CD) 360 MG 24 hr capsule Take 360 mg by mouth daily.      [name]  estradiol (VIVELLE-DOT) 0.075 MG/24HR Place 1 patch onto the skin 2 (two) times a week.      . fluticasone (FLONASE) 50 MCG/ACT nasal spray Place 1 spray into both nostrils 2 (two) times daily.      . Fluticasone-Salmeterol (ADVAIR DISKUS) 250-50 MCG/DOSE AEPB Inhale 1 puff into the lungs every 12 (twelve) hours.      [name] levalbuterol (XOPENEX HFA) 45 MCG/ACT inhaler Inhale 1-2 puffs into the lungs every 4 (four) hours as needed for shortness of breath.      . montelukast (SINGULAIR) 10 MG tablet Take 10 mg by mouth every morning.      [name] OVER THE COUNTER MEDICATION Take 1 tablet by mouth daily as needed (sleep). Herbal medication (melatonin, lemon balm, camomile, lavender.)      . Rivaroxaban (XARELTO) 20 MG TABS tablet Take 20 mg by mouth daily with supper.        No current facility-administered medications for this visit.    Allergies  Allergen Reactions  . Amoxicillin-Pot Clavulanate Nausea And Vomiting  . Moxifloxacin Hives, Itching and Rash    Past Medical History  Diagnosis Date  . Pneumothorax   . Tobacco abuse   . Allergic rhinitis   . Oral candidiasis   . Atrial fibrillation     failed cardioversion 11/2012  .  Asthma   . 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 Arthritis   . Osteopenia 07/2011    T score -2.4 FRAX 9.2%/1.2%    Past Surgical History  Procedure Laterality Date  . Lung surgery  02/1965  . Cesarean section      X 2  . Appendectomy    . Total abdominal hysterectomy w/ bilateral salpingoophorectomy  1984    Endometriosis  . Cardioversion N/A 11/27/2012    Procedure: CARDIOVERSION;  Surgeon: Vesta Mixer, MD;  Location: [facility] ENDOSCOPY;  Service: Cardiovascular;  Laterality: N/A;    History  Smoking status  . Former Smoker -- 0.50 packs/day for 20 years  . Quit date: 03/04/2004  Smokeless tobacco  . Never Used    History  Alcohol Use  . 7.0 oz/week  . 14 drink(s) per week    Comment: social    Family History    Problem Relation Age of Onset  . Stroke Father 28  . Hypertension Father   . Heart disease Father   . Diabetes Mother     Review of Systems: The review of systems is per the HPI.  All other systems were reviewed and are negative.  Physical Exam: BP 140/72  Pulse 80  Ht 5\' 5"  (1.651 m)  Wt 150 lb 12.8 oz (68.402 kg)  BMI 25.09 kg/m2 BP by me is down to 130/70.  Patient is very pleasant and in no acute distress. Little anxious. Skin is warm and dry. Color is normal.  HEENT is unremarkable. Normocephalic/atraumatic. PERRL. Sclera are nonicteric. Neck is supple. No masses. No JVD. Lungs are clear. Cardiac exam shows a regular rate and rhythm. Abdomen is soft. Extremities are without edema. Gait and ROM are intact. No gross neurologic deficits noted.  LABORATORY DATA: EKG today shows sinus rhythm. Tracing is reviewed with Dr. Excell Seltzer.   Lab Results  Component Value Date   WBC 10.6* 11/18/2012   HGB 14.4 11/18/2012   HCT 42.9 11/18/2012   PLT 206.0 11/18/2012   GLUCOSE 109* 11/18/2012   ALT 23 10/27/2012   AST 17 10/27/2012   NA 137 11/18/2012   K 3.9 11/18/2012   CL 105 11/18/2012   CREATININE 0.8 11/18/2012   BUN 17 11/18/2012   CO2 25 11/18/2012   TSH 1.21 08/08/2008   INR 1.8* 11/18/2012   Procedure: DC Cardioversion  Indications: atrial fib / flutter  Procedure Details  Consent: Obtained  Time Out: Verified patient identification, verified procedure, site/side was marked, verified correct patient position, special equipment/implants available, Radiology Safety Procedures followed, medications/allergies/relevent history reviewed, required imaging and test results available. Performed  The patient has been on adequate anticoagulation. The patient received IV Lidocaine 40 mg Then Propofol 60 mg for sedation. Synchronous cardioversion was performed at 20,150,200 joules.  The cardioversion unsuccessful . We were able to transiently cardiovert her to NSR but she converted back to A-Fib /  Flutter with in 30 seconds. We shocked her 3 times in an effort to cardiovert her.  Complications: No apparent complications  Patient did tolerate procedure well.  Vesta Mixer, Montez Hageman., MD, [facility]  11/27/2012, 8:30 AM   Echo Study Conclusions from June 2014  - Left ventricle: The cavity size was normal. There was mild focal basal hypertrophy of the septum. Systolic function was normal. The estimated ejection fraction was in the range of 60% to 65%. Wall motion was normal; there were no regional wall motion abnormalities. Doppler parameters  are consistent with abnormal left ventricular relaxation (grade 1 diastolic dysfunction). - Atrial septum: No defect or patent foramen ovale was identified.   Assessment / Plan: 1. Atrial fib - now back in sinus - looks like she is going back and forth - has been symptomatic with her AF and is felt that she will continue to go in and out due to her lung issues. Discussed with Dr. Excell Seltzer who has seen the patient today as well with me - will stop her Digoxin and start Flecainide 50 mg BID. Arrange for GXT in about 10 days to make sure we do not have proarrhythmia.   2. Asthma   3. Anticoagulation with Xarelto - no problems noted.   Will recheck baseline labs today.   Patient is agreeable to this plan and will call if any problems develop in the interim.   Rosalio Macadamia, RN, ANP-C [facility] [address] [place], [place]  [address]

## 2012-12-16 ENCOUNTER — Encounter: Payer: Self-pay | Admitting: Nurse Practitioner

## 2012-12-16 ENCOUNTER — Ambulatory Visit (INDEPENDENT_AMBULATORY_CARE_PROVIDER_SITE_OTHER): Payer: Medicare PPO | Admitting: Nurse Practitioner

## 2012-12-16 ENCOUNTER — Ambulatory Visit: Payer: Medicare PPO | Admitting: Nurse Practitioner

## 2012-12-16 ENCOUNTER — Encounter (INDEPENDENT_AMBULATORY_CARE_PROVIDER_SITE_OTHER): Payer: Self-pay

## 2012-12-16 VITALS — BP 153/80 | HR 125

## 2012-12-16 DIAGNOSIS — I4891 Unspecified atrial fibrillation: Secondary | ICD-10-CM

## 2012-12-16 LAB — CBC WITH DIFFERENTIAL/PLATELET
Basophils Absolute: 0 10*3/uL (ref 0.0–0.1)
Basophils Relative: 0.3 % (ref 0.0–3.0)
Eosinophils Absolute: 0.1 10*3/uL (ref 0.0–0.7)
Eosinophils Relative: 0.5 % (ref 0.0–5.0)
HCT: 45.3 % (ref 36.0–46.0)
Hemoglobin: 15.3 g/dL — ABNORMAL HIGH (ref 12.0–15.0)
Lymphocytes Relative: 17.8 % (ref 12.0–46.0)
Lymphs Abs: 2.1 10*3/uL (ref 0.7–4.0)
MCHC: 33.7 g/dL (ref 30.0–36.0)
MCV: 89 fl (ref 78.0–100.0)
Monocytes Absolute: 0.8 10*3/uL (ref 0.1–1.0)
Monocytes Relative: 6.8 % (ref 3.0–12.0)
Neutro Abs: 9 10*3/uL — ABNORMAL HIGH (ref 1.4–7.7)
Neutrophils Relative %: 74.6 % (ref 43.0–77.0)
Platelets: 188 10*3/uL (ref 150.0–400.0)
RBC: 5.09 Mil/uL (ref 3.87–5.11)
RDW: 14.1 % (ref 11.5–14.6)
WBC: 12 10*3/uL — ABNORMAL HIGH (ref 4.5–10.5)

## 2012-12-16 LAB — APTT: aPTT: 29.9 s — ABNORMAL HIGH (ref 21.7–28.8)

## 2012-12-16 LAB — BASIC METABOLIC PANEL
BUN: 14 mg/dL (ref 6–23)
CO2: 23 mEq/L (ref 19–32)
Calcium: 10.2 mg/dL (ref 8.4–10.5)
Chloride: 104 mEq/L (ref 96–112)
Creatinine, Ser: 0.9 mg/dL (ref 0.4–1.2)
GFR: 69.49 mL/min (ref >60.00)
Glucose, Bld: 104 mg/dL — ABNORMAL HIGH (ref 70–99)
Potassium: 3.8 mEq/L (ref 3.5–5.1)
Sodium: 139 mEq/L (ref 135–145)

## 2012-12-16 LAB — PROTIME-INR
INR: 1.2 ratio — ABNORMAL HIGH (ref 0.8–1.0)
Prothrombin Time: 12.6 s — ABNORMAL HIGH (ref 10.2–12.4)

## 2012-12-16 MED ORDER — APIXABAN 5 MG PO TABS: 5.0000 mg | ORAL_TABLET | Freq: Two times a day (BID) | ORAL | Status: DC

## 2012-12-16 MED ORDER — FLECAINIDE ACETATE 100 MG PO TABS: 100.0000 mg | ORAL_TABLET | Freq: Two times a day (BID) | ORAL | Status: DC

## 2012-12-16 NOTE — Progress Notes (Signed)
Exercise Treadmill Test  Pre-Exercise Testing Evaluation Rhythm: sinus tachycardia  Rate: 123 bpm     Test  Exercise Tolerance Test Ordering MD: Tonny Bollman, MD  Interpreting MD: Norma Fredrickson NP  Unique Test No: 1  Treadmill:  1  Indication for ETT:  Contraindication to ETT: No   Stress Modality: exercise - treadmill  Cardiac Imaging Performed: non   Protocol: standard Bruce - maximal  Max BP:  /  Max MPHR (bpm):  151 85% MPR (bpm):  128  MPHR obtained (bpm):   % MPHR obtained:    Reached 85% MPHR (min:sec):   Total Exercise Time (min-sec):    Workload in METS:   Borg Scale:   Reason ETT Terminated:      ST Segment Analysis At Rest:  With Exercise:   Other Information Arrhythmia:    Angina during ETT:   Quality of ETT:    ETT Interpretation:    Comments: Patient presents today for routine GXT testing. Has been recently placed on Flecainide for PAF/flutter. Was in sinus rhythm on her last visit with me.  Clinically has not been feeling well. Has a lower extremity rash. Dizzy. Feels her heart racing.   Test was not performed.  Patient in atrial flutter today with a rate of 125.  Rash is noted.       Recommendations: Discussed with Dr. Graciela Husbands the DOD today. Will not proceed with GXT testing. Have increased the Flecainide to 100 mg BID. Stop Xarelto - this is felt to be the culprit for her rash - start Eliquis tomorrow night at 5 mg BID. Arrange for cardioversion next week. Check labs today. Already on good dose of CCB for rate control. Dr. Graciela Husbands has said to avoid the digoxin. No beta blocker due to her asthma and extreme allergies. May need EP referral.

## 2012-12-16 NOTE — Patient Instructions (Addendum)
Stop Xarelto  Start Eliquis 5 mg two times a day - start this tomorrow night  Increase your Flecainide to 100 mg two times a day  We will plan for a cardioversion next week  We need to check labs today  OV with Dr. Excell Seltzer in 2 to 3 weeks or on a day with [place] both here  We will arrange for a cardioversion next week  You are scheduled for a cardioversion on Friday, October 24th at 10 am with Dr. Patty Sermons or associates. Please go to [facility] Short Stay at 8:30am.  Enter through the [facility] Do not have any food or drink after midnight on Thursday .  You may take your medicines with a sip of water on the day of your procedure.  You will need someone to drive you home following your procedure.     Call the [facility] office at [phone] if you have any questions, problems or concerns.

## 2012-12-17 ENCOUNTER — Ambulatory Visit: Payer: Medicare PPO | Admitting: Nurse Practitioner

## 2012-12-25 ENCOUNTER — Ambulatory Visit (HOSPITAL_COMMUNITY)
Admission: RE | Admit: 2012-12-25 | Discharge: 2012-12-25 | Disposition: A | Discharge status: Home / Self Care | Payer: Medicare PPO | Source: Ambulatory Visit | Attending: Cardiology | Admitting: Cardiology

## 2012-12-25 ENCOUNTER — Ambulatory Visit (HOSPITAL_COMMUNITY): Payer: Medicare PPO | Admitting: Anesthesiology

## 2012-12-25 ENCOUNTER — Encounter (HOSPITAL_COMMUNITY): Payer: Self-pay | Admitting: Anesthesiology

## 2012-12-25 ENCOUNTER — Encounter (HOSPITAL_COMMUNITY): Payer: Medicare PPO | Admitting: Anesthesiology

## 2012-12-25 ENCOUNTER — Encounter (HOSPITAL_COMMUNITY)
Admission: RE | Disposition: A | Discharge status: Home / Self Care | Payer: Self-pay | Source: Ambulatory Visit | Attending: Cardiology

## 2012-12-25 DIAGNOSIS — I4892 Unspecified atrial flutter: Secondary | ICD-10-CM | POA: Insufficient documentation

## 2012-12-25 DIAGNOSIS — Z538 Procedure and treatment not carried out for other reasons: Secondary | ICD-10-CM | POA: Insufficient documentation

## 2012-12-25 SURGERY — CANCELLED PROCEDURE

## 2012-12-25 NOTE — H&P (Signed)
The patient arrived for her elective cardioversion this morning.  She was found to have spontaneously converted back to normal sinus rhythm.  Therefore the procedure was canceled.  Dr. Excell Seltzer saw her in endoscopy and reviewed her EKG and plans to set her up with Dr. Johney Frame for followup

## 2012-12-25 NOTE — Anesthesia Preprocedure Evaluation (Deleted)
Anesthesia Evaluation    Airway       Dental   Pulmonary asthma (inhalers) , former smoker (quit '06),          Cardiovascular  6/14 ECHO: EF 60-65%, valves OK   Neuro/Psych    GI/Hepatic   Endo/Other    Renal/GU      Musculoskeletal   Abdominal   Peds  Hematology   Anesthesia Other Findings   Reproductive/Obstetrics                          Anesthesia Physical Anesthesia Plan Anesthesia Quick Evaluation

## 2012-12-25 NOTE — Preoperative (Signed)
Beta Blockers   Reason not to administer Beta Blockers:Not Applicable 

## 2012-12-30 ENCOUNTER — Telehealth: Payer: Self-pay | Admitting: Nurse Practitioner

## 2012-12-30 ENCOUNTER — Encounter (HOSPITAL_COMMUNITY): Payer: Medicare PPO

## 2012-12-30 NOTE — Telephone Encounter (Signed)
New message  Patient has questions that she would like to talk with you about regarding Stress Test that she couldn't do today, Please call her back.

## 2012-12-30 NOTE — Telephone Encounter (Signed)
To Hoang Pettingill G 

## 2012-12-31 NOTE — Telephone Encounter (Signed)
I did further investigation and pt is on flecainide (100 mg)  Bid Lori put pt on at last office visit

## 2012-12-31 NOTE — Telephone Encounter (Signed)
S/w pt is feeling dizzy, nausea, chill, itchy seems to be every other day since Saturday has some fatigue, stated Elizabeth Mcconnell changed flecainide to 100 mg bid I did not see that in the chart noted. Pt also cancelled stress test yesterday stated was feeling to bad.  Also wanted to know if pt had to keep both appointments in November with Elizabeth Mcconnell and Elizabeth Mcconnell. I stated to pt will talk with Elizabeth Mcconnell in the am and will get back to her asap. I stated if pt is feeling worse today to call back and pt stated was having a good day and on her way to work. Pt agreeable to plan.I will route this to [facility]

## 2013-01-01 NOTE — Telephone Encounter (Signed)
She needs to see Dr. Johney Frame - not me. Looks like there will be discussion of an ablation - which needs to come from him.

## 2013-01-01 NOTE — Telephone Encounter (Signed)
S/w pt discussed Tresa Endo is not in the office today and I will discuss with Tresa Endo on Monday, pt stated verbal understanding and was appreciative

## 2013-01-01 NOTE — Telephone Encounter (Signed)
S/w pt this am and stated with all pt's problems would like to come in earlier to see Dr. Johney Frame if possible I will discuss with Dennis Bast, RN, Dr. Jenel Lucks nurse and also route this message to her

## 2013-01-04 ENCOUNTER — Telehealth: Payer: Self-pay | Admitting: *Deleted

## 2013-01-04 NOTE — Telephone Encounter (Signed)
S/w pt to let pt know I will cancel Sunday [facility] appt unless pt thought really needed to be seen. Pt decided to wait to see Dr. Johney Frame. Pt is in agreement to plan.

## 2013-01-04 NOTE — Telephone Encounter (Signed)
S/w pt stated I s/w Dennis Bast, RN this am and stated there is no sooner appt but if has a cancellation on 11/7 will call pt to come in. Pt understood and is aware pt also stated husband had a heart attack this weekend. Pt also wanted to know if should keep appt with Norma Fredrickson, NP on 11/11 since pt will be here to see Dr. Johney Frame on 11/19 will route to [facility]

## 2013-01-04 NOTE — Telephone Encounter (Signed)
I thought I had already addressed this - I would favor her seeing Dr. Johney Frame.  Could try to change her visit to a sooner day (only if he is here in the office) with me if she is having trouble.   Please tell her I am sorry about her husband.

## 2013-01-07 ENCOUNTER — Telehealth: Payer: Self-pay | Admitting: Nurse Practitioner

## 2013-01-07 DIAGNOSIS — M7989 Other specified soft tissue disorders: Secondary | ICD-10-CM

## 2013-01-07 DIAGNOSIS — M25471 Effusion, right ankle: Secondary | ICD-10-CM

## 2013-01-07 DIAGNOSIS — M79604 Pain in right leg: Secondary | ICD-10-CM

## 2013-01-07 DIAGNOSIS — L539 Erythematous condition, unspecified: Secondary | ICD-10-CM

## 2013-01-07 NOTE — Telephone Encounter (Signed)
New message  Elizabeth Mcconnell patient only wants to speak with you, She would not get me

## 2013-01-08 ENCOUNTER — Other Ambulatory Visit: Payer: Self-pay

## 2013-01-08 ENCOUNTER — Encounter (INDEPENDENT_AMBULATORY_CARE_PROVIDER_SITE_OTHER): Payer: Self-pay

## 2013-01-08 ENCOUNTER — Ambulatory Visit (HOSPITAL_COMMUNITY): Payer: Medicare PPO | Attending: Cardiovascular Disease

## 2013-01-08 DIAGNOSIS — M7989 Other specified soft tissue disorders: Secondary | ICD-10-CM

## 2013-01-08 DIAGNOSIS — M25471 Effusion, right ankle: Secondary | ICD-10-CM

## 2013-01-08 DIAGNOSIS — M79604 Pain in right leg: Secondary | ICD-10-CM

## 2013-01-08 DIAGNOSIS — I4891 Unspecified atrial fibrillation: Secondary | ICD-10-CM | POA: Insufficient documentation

## 2013-01-08 DIAGNOSIS — M79609 Pain in unspecified limb: Secondary | ICD-10-CM

## 2013-01-08 DIAGNOSIS — L0390 Cellulitis, unspecified: Secondary | ICD-10-CM

## 2013-01-08 DIAGNOSIS — I4892 Unspecified atrial flutter: Secondary | ICD-10-CM | POA: Insufficient documentation

## 2013-01-08 DIAGNOSIS — L539 Erythematous condition, unspecified: Secondary | ICD-10-CM

## 2013-01-08 MED ORDER — CEPHALEXIN 500 MG PO CAPS: 500.0000 mg | ORAL_CAPSULE | Freq: Three times a day (TID) | ORAL | Status: DC

## 2013-01-08 NOTE — Telephone Encounter (Signed)
dvt study ordered/ pt coming now for study/ dr cooper to read.

## 2013-01-08 NOTE — Telephone Encounter (Signed)
Follow up    Rt leg painful, swollen---will forward to triage now

## 2013-01-12 ENCOUNTER — Telehealth: Payer: Self-pay | Admitting: Internal Medicine

## 2013-01-12 ENCOUNTER — Ambulatory Visit: Payer: Medicare PPO | Admitting: Nurse Practitioner

## 2013-01-12 NOTE — Telephone Encounter (Signed)
Would stop flecainide and see PCP for leg problem (I know she is in transition from one PCP to another). Recommend EP eval for atrial fib. In meantime elevate leg as much as possible.

## 2013-01-12 NOTE — Telephone Encounter (Signed)
Pt called because she said she has been so nauseated and dizzy that she can't get out bed; her room is spinning. This started last night, pt   needs help to get out bed. Pt states the dizziness started since the Flecainide dose was increased to 100 mg twice a day. Pt also C/O of her right leg still swollen. Pt is aware that the [place] of her leg did not show deep or superficial venus thrombosis. Pt would like to know what to do. Pt is aware that I will send this message to MD for recommendations.

## 2013-01-12 NOTE — Telephone Encounter (Signed)
I spoke with the pt and made her aware that she needs to stop flecainide due to dizziness and nausea. The pt will keep her scheduled appointment with Dr Johney Frame on 01/20/13. The pt said her leg is improving and I made her aware that if she continues to have problems with her leg then she can go to an Urgent Care. I did advise her to elevate leg when possible and finish course of Keflex. Pt agreed with plan.

## 2013-01-12 NOTE — Telephone Encounter (Signed)
New Problem:  Pt states she is so dizzy she cannot even walk and she feels like she is going to through up. Pt states her right leg is swollen. Pt would like to be advised.

## 2013-01-20 ENCOUNTER — Encounter: Payer: Self-pay | Admitting: Internal Medicine

## 2013-01-20 ENCOUNTER — Other Ambulatory Visit: Payer: Self-pay | Admitting: *Deleted

## 2013-01-20 ENCOUNTER — Ambulatory Visit (INDEPENDENT_AMBULATORY_CARE_PROVIDER_SITE_OTHER): Payer: Medicare PPO | Admitting: Internal Medicine

## 2013-01-20 ENCOUNTER — Encounter: Payer: Self-pay | Admitting: *Deleted

## 2013-01-20 VITALS — BP 141/69 | HR 72 | Ht 65.0 in | Wt 155.1 lb

## 2013-01-20 DIAGNOSIS — I4892 Unspecified atrial flutter: Secondary | ICD-10-CM

## 2013-01-20 DIAGNOSIS — I4891 Unspecified atrial fibrillation: Secondary | ICD-10-CM

## 2013-01-20 NOTE — Progress Notes (Signed)
Referring Physician: Mike Cooper, MD  Elizabeth Mcconnell is a 67 y.o. female with a h/o past pneumothorax, tobacco abuse, severe atopic ashtma, allergic rhinitis and atrial flutter.  Her atrial flutter was initially diagnosed in 2006 at the time of an acute asthma exacerbation  She did well until September of this year when she was found to have recurrant atrial flutter.  She was anticoagulated and placed on Flecainide with plans for DCCV, she converted to SR spontaneously before cardioversion.  She then had recurrent flutter and underwent attempted cardioversion 11-27-2012 which was not successful.  She was intolerant of Flecainide with dizziness and nausea.  She has been referred today to discuss alternative treatment options.   Last echo 08-2012 demonstrated EF 60-65%, no RWMA, grade 1 diastolic dysfunction, LA 31.  Review of EKG's in EPIC demonstrate typical atrial flutter.  There is no evidence of atrial fibrillation.   She has been appropriately anticoagulated with Eliquis. Her CHADSVASc score is 2 (age, female sex).   Today, she denies symptoms of palpitations, chest pain, shortness of breath, orthopnea, PND, lower extremity edema, dizziness, presyncope, syncope, or neurologic sequela. The patient is tolerating medications without difficulties and is otherwise without complaint today.   Past Medical History  Diagnosis Date  . Tobacco abuse   . Allergic rhinitis   . Oral candidiasis   . Atrial flutter     failed cardioversion 11/2012 - converted on her own - now on flecainide/xarelto  . Asthma   . Pneumothorax on left 1996    left lower lung collapse s/p resection  . Arthritis   . Osteopenia 07/2011    T score -2.4 FRAX 9.2%/1.2%   Past Surgical History  Procedure Laterality Date  . Lung surgery  02/1965  . Cesarean section      X 2  . Appendectomy    . Total abdominal hysterectomy w/ bilateral salpingoophorectomy  1984    Endometriosis  . Cardioversion N/A 11/27/2012   Procedure: CARDIOVERSION;  Surgeon: Philip J Nahser, MD;  Location: [facility] ENDOSCOPY;  Service: Cardiovascular;  Laterality: N/A;    Current Outpatient Prescriptions  Medication Sig Dispense Refill  . acetaminophen (TYLENOL) 500 MG tablet Take 500 mg by mouth every 6 (six) hours as needed for pain.      . Calcium Carbonate-Vitamin D (CALCIUM-VITAMIN D) 500-200 MG-UNIT per tablet Take 1 tablet by mouth daily.        . diltiazem (CARDIZEM CD) 360 MG 24 hr capsule Take 360 mg by mouth daily.      . estradiol (VIVELLE-DOT) 0.075 MG/24HR Place 1 patch onto the skin 2 (two) times a week.      . fluticasone (FLONASE) 50 MCG/ACT nasal spray Place 1 spray into both nostrils 2 (two) times daily.      . Fluticasone-Salmeterol (ADVAIR DISKUS) 250-50 MCG/DOSE AEPB Inhale 1 puff into the lungs every 12 (twelve) hours.      . levalbuterol (XOPENEX HFA) 45 MCG/ACT inhaler Inhale 1-2 puffs into the lungs every 4 (four) hours as needed for shortness of breath.      . montelukast (SINGULAIR) 10 MG tablet Take 10 mg by mouth every morning.      . OVER THE COUNTER MEDICATION Take 1 tablet by mouth daily as needed (sleep). Herbal medication (melatonin, lemon balm, camomile, lavender.)      . apixaban (ELIQUIS) 5 MG TABS tablet Take 5 mg by mouth 2 (two) times daily.      . Cholecalciferol (VITAMIN D-3) 5000 UNITS TABS Take   5,000 Units by mouth every other day.       No current facility-administered medications for this visit.    Allergies  Allergen Reactions  . Amoxicillin-Pot Clavulanate Nausea And Vomiting  . Flecainide     dizziness  . Moxifloxacin Hives, Itching and Rash    History   Social History  . Marital Status: Married    Spouse Name: N/A    Number of Children: 2  . Years of Education: N/A   Occupational History  . 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     Social History Main Topics  . Smoking status: Former Smoker -- 0.50 packs/day for 20 years    Quit date: 03/04/2004  . Smokeless tobacco:  Never Used  . Alcohol Use: 7.0 oz/week    14 drink(s) per week     Comment: social  . Drug Use: No  . Sexual Activity: Yes    Birth Control/ Protection: Surgical, Post-menopausal     Comment: HYST   Other Topics Concern  . Not on file   Social History Narrative  . No narrative on file    Family History  Problem Relation Age of Onset  . Stroke Father 50  . Hypertension Father   . Heart disease Father   . Diabetes Mother     ROS- All systems are reviewed and negative except as per the HPI above  Physical Exam: Filed Vitals:   01/20/13 0835  BP: 141/69  Pulse: 72  Height: 5' 5" (1.651 m)  Weight: 155 lb 1.9 oz (70.362 kg)    GEN- The patient is well appearing, alert and oriented x 3 today.   Head- normocephalic, atraumatic Eyes-  Sclera clear, conjunctiva pink Ears- hearing intact Oropharynx- clear Neck- supple, no JVP Lymph- no cervical lymphadenopathy Lungs- Clear to ausculation bilaterally, normal work of breathing Heart- Regular rate and rhythm, no murmurs, rubs or gallops, PMI not laterally displaced GI- soft, NT, ND, + BS Extremities- no clubbing, cyanosis, or edema MS- no significant deformity or atrophy Skin- no rash or lesion Psych- euthymic mood, full affect Neuro- strength and sensation are intact  EKG- sinus rhythm rate 69 intervals .19/.10/.44  Assessment and Plan:  1. Typical atrial flutter The patient has a h/o symptomatic recurrent atrial flutter.  I have reviewed all ekgs in epic.  This demonstrates typical appearing atrial flutter.  I do not see afib documented. Therapeutic strategies for atrial flutter including medicine and ablation were discussed in detail with the patient today. Risk, benefits, and alternatives to EP study and radiofrequency ablation were also discussed in detail today. These risks include but are not limited to stroke, bleeding, vascular damage, tamponade, perforation, damage to the heart and other structures, AV block  requiring pacemaker, worsening renal function, and death. The patient understands these risk and wishes to proceed.  We will therefore proceed with catheter ablation at the next available time. Continue current medical therapy including eliquis at this time.   

## 2013-01-20 NOTE — Patient Instructions (Signed)
Your physician recommends that you continue on your current medications as directed. Please refer to the Current Medication list given to you today.  Your physician has recommended that you have an ablation. Catheter ablation is a medical procedure used to treat some cardiac arrhythmias (irregular heartbeats). During catheter ablation, a long, thin, flexible tube is put into a blood vessel in your groin (upper thigh), or neck. This tube is called an ablation catheter. It is then guided to your heart through the blood vessel. Radio frequency waves destroy small areas of heart tissue where abnormal heartbeats may cause an arrhythmia to start. Please see the instruction sheet given to you today.  Your physician recommends that you return for lab work on: 01/26/13

## 2013-01-21 ENCOUNTER — Encounter (HOSPITAL_COMMUNITY): Payer: Self-pay | Admitting: Pharmacy Technician

## 2013-01-23 ENCOUNTER — Encounter: Payer: Self-pay | Admitting: Internal Medicine

## 2013-01-23 DIAGNOSIS — I4892 Unspecified atrial flutter: Secondary | ICD-10-CM | POA: Insufficient documentation

## 2013-01-25 ENCOUNTER — Other Ambulatory Visit (INDEPENDENT_AMBULATORY_CARE_PROVIDER_SITE_OTHER): Payer: Medicare PPO

## 2013-01-25 DIAGNOSIS — I4892 Unspecified atrial flutter: Secondary | ICD-10-CM

## 2013-01-25 DIAGNOSIS — I4891 Unspecified atrial fibrillation: Secondary | ICD-10-CM

## 2013-01-25 LAB — CBC WITH DIFFERENTIAL/PLATELET
Basophils Absolute: 0 10*3/uL (ref 0.0–0.1)
Basophils Relative: 0.4 % (ref 0.0–3.0)
Eosinophils Absolute: 0.2 10*3/uL (ref 0.0–0.7)
Eosinophils Relative: 1.9 % (ref 0.0–5.0)
HCT: 39.5 % (ref 36.0–46.0)
Hemoglobin: 13.2 g/dL (ref 12.0–15.0)
Lymphocytes Relative: 23 % (ref 12.0–46.0)
Lymphs Abs: 2.1 10*3/uL (ref 0.7–4.0)
MCHC: 33.5 g/dL (ref 30.0–36.0)
MCV: 88.8 fl (ref 78.0–100.0)
Monocytes Absolute: 0.6 10*3/uL (ref 0.1–1.0)
Monocytes Relative: 6.8 % (ref 3.0–12.0)
Neutro Abs: 6.3 10*3/uL (ref 1.4–7.7)
Neutrophils Relative %: 67.9 % (ref 43.0–77.0)
Platelets: 218 10*3/uL (ref 150.0–400.0)
RBC: 4.44 Mil/uL (ref 3.87–5.11)
RDW: 13.8 % (ref 11.5–14.6)
WBC: 9.3 10*3/uL (ref 4.5–10.5)

## 2013-01-25 LAB — BASIC METABOLIC PANEL
BUN: 17 mg/dL (ref 6–23)
CO2: 25 mEq/L (ref 19–32)
Calcium: 9.6 mg/dL (ref 8.4–10.5)
Chloride: 105 mEq/L (ref 96–112)
Creatinine, Ser: 0.9 mg/dL (ref 0.4–1.2)
GFR: 65.08 mL/min (ref >60.00)
Glucose, Bld: 103 mg/dL — ABNORMAL HIGH (ref 70–99)
Potassium: 3.5 mEq/L (ref 3.5–5.1)
Sodium: 138 mEq/L (ref 135–145)

## 2013-01-25 LAB — PROTIME-INR
INR: 1.2 ratio — ABNORMAL HIGH (ref 0.8–1.0)
Prothrombin Time: 12.7 s — ABNORMAL HIGH (ref 10.2–12.4)

## 2013-01-26 ENCOUNTER — Other Ambulatory Visit: Payer: Medicare PPO

## 2013-01-27 ENCOUNTER — Encounter (HOSPITAL_COMMUNITY): Payer: Medicare PPO

## 2013-02-02 ENCOUNTER — Encounter (HOSPITAL_COMMUNITY): Payer: Medicare PPO | Admitting: Anesthesiology

## 2013-02-02 ENCOUNTER — Encounter (HOSPITAL_COMMUNITY): Payer: Self-pay | Admitting: Anesthesiology

## 2013-02-02 ENCOUNTER — Encounter (HOSPITAL_COMMUNITY)
Admission: RE | Disposition: A | Discharge status: Home / Self Care | Payer: Self-pay | Source: Ambulatory Visit | Attending: Internal Medicine

## 2013-02-02 ENCOUNTER — Ambulatory Visit (HOSPITAL_COMMUNITY)
Admission: RE | Admit: 2013-02-02 | Discharge: 2013-02-02 | Disposition: A | Discharge status: Home / Self Care | Payer: Medicare PPO | Source: Ambulatory Visit | Attending: Internal Medicine | Admitting: Internal Medicine

## 2013-02-02 ENCOUNTER — Ambulatory Visit (HOSPITAL_COMMUNITY): Payer: Medicare PPO | Admitting: Anesthesiology

## 2013-02-02 DIAGNOSIS — J45909 Unspecified asthma, uncomplicated: Secondary | ICD-10-CM | POA: Insufficient documentation

## 2013-02-02 DIAGNOSIS — Z79899 Other long term (current) drug therapy: Secondary | ICD-10-CM | POA: Insufficient documentation

## 2013-02-02 DIAGNOSIS — I4892 Unspecified atrial flutter: Secondary | ICD-10-CM

## 2013-02-02 DIAGNOSIS — M949 Disorder of cartilage, unspecified: Secondary | ICD-10-CM | POA: Insufficient documentation

## 2013-02-02 DIAGNOSIS — F17200 Nicotine dependence, unspecified, uncomplicated: Secondary | ICD-10-CM | POA: Insufficient documentation

## 2013-02-02 DIAGNOSIS — M129 Arthropathy, unspecified: Secondary | ICD-10-CM | POA: Insufficient documentation

## 2013-02-02 DIAGNOSIS — M899 Disorder of bone, unspecified: Secondary | ICD-10-CM | POA: Insufficient documentation

## 2013-02-02 DIAGNOSIS — J309 Allergic rhinitis, unspecified: Secondary | ICD-10-CM | POA: Insufficient documentation

## 2013-02-02 HISTORY — PX: ATRIAL FLUTTER ABLATION: SHX5733

## 2013-02-02 SURGERY — ATRIAL FLUTTER ABLATION
Anesthesia: General

## 2013-02-02 MED ORDER — SODIUM CHLORIDE 0.9 % IJ SOLN: 3.0000 mL | Freq: Two times a day (BID) | INTRAMUSCULAR | Status: DC

## 2013-02-02 MED ORDER — ONDANSETRON HCL 4 MG/2ML IJ SOLN: 4.0000 mg | Freq: Four times a day (QID) | INTRAMUSCULAR | Status: DC | PRN

## 2013-02-02 MED ORDER — HYDROMORPHONE HCL PF 1 MG/ML IJ SOLN: 0.2500 mg | INTRAMUSCULAR | Status: DC | PRN

## 2013-02-02 MED ORDER — SODIUM CHLORIDE 0.9 % IV SOLN
INTRAVENOUS | Status: DC | PRN
  Administered 2013-02-02: 07:00:00 via INTRAVENOUS

## 2013-02-02 MED ORDER — SODIUM CHLORIDE 0.9 % IV SOLN: 250.0000 mL | INTRAVENOUS | Status: DC | PRN

## 2013-02-02 MED ORDER — ACETAMINOPHEN 325 MG PO TABS: 650.0000 mg | ORAL_TABLET | ORAL | Status: DC | PRN

## 2013-02-02 MED ORDER — SODIUM CHLORIDE 0.9 % IJ SOLN: 3.0000 mL | INTRAMUSCULAR | Status: DC | PRN

## 2013-02-02 MED ORDER — HYDROCODONE-ACETAMINOPHEN 5-325 MG PO TABS
1.0000 | ORAL_TABLET | ORAL | Status: DC | PRN
  Administered 2013-02-02: 1 via ORAL
  Filled 2013-02-02: qty 1

## 2013-02-02 MED ORDER — ONDANSETRON HCL 4 MG/2ML IJ SOLN: 4.0000 mg | Freq: Once | INTRAMUSCULAR | Status: DC | PRN

## 2013-02-02 MED ORDER — LEVALBUTEROL TARTRATE 45 MCG/ACT IN AERO
1.0000 | INHALATION_SPRAY | RESPIRATORY_TRACT | Status: DC | PRN
  Filled 2013-02-02: qty 15

## 2013-02-02 MED ORDER — APIXABAN 5 MG PO TABS
5.0000 mg | ORAL_TABLET | Freq: Two times a day (BID) | ORAL | Status: DC
  Filled 2013-02-02: qty 1

## 2013-02-02 MED ORDER — MONTELUKAST SODIUM 10 MG PO TABS: 10.0000 mg | ORAL_TABLET | Freq: Every morning | ORAL | Status: DC

## 2013-02-02 MED ORDER — FLUTICASONE PROPIONATE 50 MCG/ACT NA SUSP
1.0000 | Freq: Two times a day (BID) | NASAL | Status: DC
  Filled 2013-02-02: qty 16

## 2013-02-02 NOTE — Anesthesia Postprocedure Evaluation (Signed)
  Anesthesia Post-op Note  Patient: Elizabeth Mcconnell  Procedure(s) Performed: Procedure(s): ATRIAL FLUTTER ABLATION (N/A)  Patient Location: PACU  Anesthesia Type:General  Level of Consciousness: awake, alert , oriented and patient cooperative  Airway and Oxygen Therapy: Patient Spontanous Breathing  Post-op Pain: none  Post-op Assessment: Post-op Vital signs reviewed, Patient's Cardiovascular Status Stable, Respiratory Function Stable, Patent Airway, No signs of Nausea or vomiting and Pain level controlled  Post-op Vital Signs: stable  Complications: No apparent anesthesia complications

## 2013-02-02 NOTE — Transfer of Care (Signed)
Immediate Anesthesia Transfer of Care Note  Patient: Elizabeth Mcconnell  Procedure(s) Performed: Procedure(s): ATRIAL FLUTTER ABLATION (N/A)  Patient Location: Cath Lab  Anesthesia Type:General  Level of Consciousness: awake, alert , oriented and patient cooperative  Airway & Oxygen Therapy: Patient Spontanous Breathing and Patient connected to nasal cannula oxygen  Post-op Assessment: Report given to PACU RN and Post -op Vital signs reviewed and stable  Post vital signs: Reviewed  Complications: No apparent anesthesia complications

## 2013-02-02 NOTE — Anesthesia Preprocedure Evaluation (Addendum)
Anesthesia Evaluation  Patient identified by MRN, date of birth, ID band Patient awake    Reviewed: Allergy & Precautions, H&P , NPO status , Patient's Chart, lab work & pertinent test results  History of Anesthesia Complications Negative for: history of anesthetic complications  Airway Mallampati: II TM Distance: >3 FB Neck ROM: Limited    Dental  (+) Teeth Intact and Dental Advisory Given   Pulmonary asthma , former smoker,  breath sounds clear to auscultation        Cardiovascular + dysrhythmias Atrial Fibrillation Rhythm:Regular Rate:Normal     Neuro/Psych Anxiety negative neurological ROS  negative psych ROS   GI/Hepatic negative GI ROS, Neg liver ROS,   Endo/Other  negative endocrine ROS  Renal/GU negative Renal ROS     Musculoskeletal   Abdominal   Peds  Hematology negative hematology ROS (+)   Anesthesia Other Findings   Reproductive/Obstetrics negative OB ROS                          Anesthesia Physical Anesthesia Plan  ASA: III  Anesthesia Plan: General   Post-op Pain Management:    Induction: Intravenous  Airway Management Planned: LMA  Additional Equipment:   Intra-op Plan:   Post-operative Plan: Extubation in OR  Informed Consent: I have reviewed the patients History and Physical, chart, labs and discussed the procedure including the risks, benefits and alternatives for the proposed anesthesia with the patient or authorized representative who has indicated his/her understanding and acceptance.     Plan Discussed with:   Anesthesia Plan Comments:         Anesthesia Quick Evaluation

## 2013-02-02 NOTE — H&P (View-Only) (Signed)
Referring Physician: Calton Dach, MD  Elizabeth Mcconnell is a 67 y.o. female with a h/o past pneumothorax, tobacco abuse, severe atopic ashtma, allergic rhinitis and atrial flutter.  Her atrial flutter was initially diagnosed in 2006 at the time of an acute asthma exacerbation  She did well until September of this year when she was found to have recurrant atrial flutter.  She was anticoagulated and placed on Flecainide with plans for DCCV, she converted to SR spontaneously before cardioversion.  She then had recurrent flutter and underwent attempted cardioversion 11-27-2012 which was not successful.  She was intolerant of Flecainide with dizziness and nausea.  She has been referred today to discuss alternative treatment options.   Last echo 08-2012 demonstrated EF 60-65%, no RWMA, grade 1 diastolic dysfunction, LA 31.  Review of EKG's in EPIC demonstrate typical atrial flutter.  There is no evidence of atrial fibrillation.   She has been appropriately anticoagulated with Eliquis. Her CHADSVASc score is 2 (age, female sex).   Today, she denies symptoms of palpitations, chest pain, shortness of breath, orthopnea, PND, lower extremity edema, dizziness, presyncope, syncope, or neurologic sequela. The patient is tolerating medications without difficulties and is otherwise without complaint today.   Past Medical History  Diagnosis Date  . Tobacco abuse   . Allergic rhinitis   . Oral candidiasis   . Atrial flutter     failed cardioversion 11/2012 - converted on her own - now on flecainide/xarelto  . Asthma   . Pneumothorax on left 1996    left lower lung collapse s/p resection  . Arthritis   . Osteopenia 07/2011    T score -2.4 FRAX 9.2%/1.2%   Past Surgical History  Procedure Laterality Date  . Lung surgery  02/1965  . Cesarean section      X 2  . Appendectomy    . Total abdominal hysterectomy w/ bilateral salpingoophorectomy  1984    Endometriosis  . Cardioversion N/A 11/27/2012   Procedure: CARDIOVERSION;  Surgeon: Vesta Mixer, MD;  Location: [facility] ENDOSCOPY;  Service: Cardiovascular;  Laterality: N/A;    Current Outpatient Prescriptions  Medication Sig Dispense Refill  . acetaminophen (TYLENOL) 500 MG tablet Take 500 mg by mouth every 6 (six) hours as needed for pain.      . Calcium Carbonate-Vitamin D (CALCIUM-VITAMIN D) 500-200 MG-UNIT per tablet Take 1 tablet by mouth daily.        [name] diltiazem (CARDIZEM CD) 360 MG 24 hr capsule Take 360 mg by mouth daily.      [name] estradiol (VIVELLE-DOT) 0.075 MG/24HR Place 1 patch onto the skin 2 (two) times a week.      . fluticasone (FLONASE) 50 MCG/ACT nasal spray Place 1 spray into both nostrils 2 (two) times daily.      . Fluticasone-Salmeterol (ADVAIR DISKUS) 250-50 MCG/DOSE AEPB Inhale 1 puff into the lungs every 12 (twelve) hours.      [name] levalbuterol (XOPENEX HFA) 45 MCG/ACT inhaler Inhale 1-2 puffs into the lungs every 4 (four) hours as needed for shortness of breath.      . montelukast (SINGULAIR) 10 MG tablet Take 10 mg by mouth every morning.      [name] OVER THE COUNTER MEDICATION Take 1 tablet by mouth daily as needed (sleep). Herbal medication (melatonin, lemon balm, camomile, lavender.)      . apixaban (ELIQUIS) 5 MG TABS tablet Take 5 mg by mouth 2 (two) times daily.      . Cholecalciferol (VITAMIN D-3) 5000 UNITS TABS Take  5,000 Units by mouth every other day.       No current facility-administered medications for this visit.    Allergies  Allergen Reactions  . Amoxicillin-Pot Clavulanate Nausea And Vomiting  . Flecainide     dizziness  . Moxifloxacin Hives, Itching and Rash    History   Social History  . Marital Status: Married    Spouse Name: N/A    Number of Children: 2  . Years of Education: N/A   Occupational History  . 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     Social History Main Topics  . Smoking status: Former Smoker -- 0.50 packs/day for 20 years    Quit date: 03/04/2004  . Smokeless tobacco:  Never Used  . Alcohol Use: 7.0 oz/week    14 drink(s) per week     Comment: social  . Drug Use: No  . Sexual Activity: Yes    Birth Control/ Protection: Surgical, Post-menopausal     Comment: HYST   Other Topics Concern  . Not on file   Social History Narrative  . No narrative on file    Family History  Problem Relation Age of Onset  . Stroke Father 31  . Hypertension Father   . Heart disease Father   . Diabetes Mother     ROS- All systems are reviewed and negative except as per the HPI above  Physical Exam: Filed Vitals:   01/20/13 0835  BP: 141/69  Pulse: 72  Height: 5\' 5"  (1.651 m)  Weight: 155 lb 1.9 oz (70.362 kg)    GEN- The patient is well appearing, alert and oriented x 3 today.   Head- normocephalic, atraumatic Eyes-  Sclera clear, conjunctiva pink Ears- hearing intact Oropharynx- clear Neck- supple, no JVP Lymph- no cervical lymphadenopathy Lungs- Clear to ausculation bilaterally, normal work of breathing Heart- Regular rate and rhythm, no murmurs, rubs or gallops, PMI not laterally displaced GI- soft, NT, ND, + BS Extremities- no clubbing, cyanosis, or edema MS- no significant deformity or atrophy Skin- no rash or lesion Psych- euthymic mood, full affect Neuro- strength and sensation are intact  EKG- sinus rhythm rate 69 intervals .19/.10/.44  Assessment and Plan:  1. Typical atrial flutter The patient has a h/o symptomatic recurrent atrial flutter.  I have reviewed all ekgs in epic.  This demonstrates typical appearing atrial flutter.  I do not see afib documented. Therapeutic strategies for atrial flutter including medicine and ablation were discussed in detail with the patient today. Risk, benefits, and alternatives to EP study and radiofrequency ablation were also discussed in detail today. These risks include but are not limited to stroke, bleeding, vascular damage, tamponade, perforation, damage to the heart and other structures, AV block  requiring pacemaker, worsening renal function, and death. The patient understands these risk and wishes to proceed.  We will therefore proceed with catheter ablation at the next available time. Continue current medical therapy including eliquis at this time.

## 2013-02-02 NOTE — Op Note (Signed)
PREPROCEDURE DIAGNOSIS: Typical-appearing atrial flutter.   POSTPROCEDURE DIAGNOSIS: Isthmus-dependent right atrial flutter.   PROCEDURES:  1. Comprehensive EP study.  2. Coronary sinus pacing and recording.  3. Mapping of SVT.  4. Ablation of SVT.   INTRODUCTION:  Elizabeth Mcconnell is a 67 y.o. female with a history of symptomatic typical-appearing atrial flutter. She has failed medical therapy with beta blockers and flecainide.  She presents today for EP study and radiofrequency ablation.   DESCRIPTION OF THE PROCEDURE: Informed written consent was obtained, and the patient was brought to the electrophysiology lab in the fasting state. The patient was then adequately sedated with anesthesia as outlined in the anesthesia report. The patient's right groin was prepped and draped in the usual sterile fashion by the EP lab staff. Using a percutaneous Seldinger technique a 6, 6, and 8-French  hemostasis sheaths were placed into the right common femoral vein. A 6 French decapolar Polaris X decapolar coronary sinus catheter was introduced through the right common femoral vein and advanced into the coronary sinus for recording and pacing from this location. A 6-French quadripolar Josephson catheter was introduced through the right common femoral vein and advanced into the right ventricle for recording and pacing. This catheter was then pulled back to the His bundle location. The patient presented to the electrophysiology lab in sinus rhythm.  Her PR interval measured 190 msec with a QRS duration of 109 msec and a Qt interval of 482 mesc.  The AH interval measured with an HV interval of 49 msec.  Her average RR interval was 1062.  Ventricular pacing was performed which revealed midline concentric decremental VA conduction with a retrograde AV WCL of 490 msec.  VEST was performed which also revealed midline concentric decremental VA conduction with a retrograde AV nodal ERP of 600/390 msec.  Rapid  atrial pacing was performed which revealed PR<RR with an AV WCL of 460 msec.  With rapid atrial pacing down to a cycle length of 200 msec, atrial flutter was induced.  The surface electrocardiogram was consistent with typical atrial flutter. The coronary sinus activation sequence was proximal to distal and suggestive of right atrial flutter.  The atrial flutter cycle length was 230 msec. Atrial entrainment mapping was then performed. When pacing from the left atrium, a long post pacing interval was observed. With entrainment mapping from the cavotricuspid isthmus, the post pacing interval was equal to the tachycardia cycle length and therefore suggestive of isthmus-dependent right atrial flutter.  I elected to perform cavotricuspid isthmus ablation.  Atrial flutter terminated with catheter manipulation within the right atrium.  A [employer] 7-French  10mm ablation catheter was introduced through the right common femoral vein and advanced into the right atrium. Mapping of the cavotricuspid isthmus was performed which revealed a standard isthmus. A series of 11 radiofrequency applications were delivered along the cavotricuspid isthmus with a target temperature of 60 degrees with power of 70-100 watts in order to obtain complete bidirectional cavotricuspid isthmus block.   Following ablation, differential atrial pacing was performed from the low lateral right atrium with a Duodecapolar halo catheter in place. This confirmed complete bidirectional cavotricuspid isthmus block with a stimulus to earliest atrial activation recorded bidirectional across the isthmus measuring 130 msec. The patient was observed for 20 minutes without return of conduction through the isthmus.   Following ablation, the AH interval measured 142 msec with an HV interval of 40 msec. Rapid atrial pacing was performed, which revealed an AV Wenckebach cycle length of  460 msec with no evidence of PR greater than RR and no tachycardias  induced when pacing down to a cycle length of 200 msec.  The procedure was therefore considered completed. All catheters were removed, and the sheaths were aspirated and flushed. The sheaths were removed and hemostasis was assured. There were no early apparent complications.   CONCLUSIONS:  1. Isthmus-dependent right atrial flutter induced and successfully ablated along the usual cavotricuspid isthmus.  2. Complete bidirectional cavotricuspid isthmus block achieved.  3. No inducible arrhythmias following ablation.  4. No APs or dual AV nodal physiology 5. No early apparent complications.  Fayrene Fearing Elis Sauber,MD 02/02/2013 10:11 AM

## 2013-02-02 NOTE — Interval H&P Note (Signed)
History and Physical Interval Note:  02/02/2013 7:42 AM  Elizabeth Mcconnell  has presented today for surgery, with the diagnosis of aflutter  The various methods of treatment have been discussed with the patient and family. After consideration of risks, benefits and other options for treatment, the patient has consented to  Procedure(s): ATRIAL FLUTTER ABLATION (N/A) as a surgical intervention .  The patient's history has been reviewed, patient examined, no change in status, stable for surgery.  I have reviewed the patient's chart and labs.  Questions were answered to the patient's satisfaction.     Hillis Range

## 2013-02-02 NOTE — Preoperative (Signed)
Beta Blockers   Reason not to administer Beta Blockers:Not Applicable 

## 2013-02-03 ENCOUNTER — Encounter (HOSPITAL_COMMUNITY): Payer: Self-pay | Admitting: *Deleted

## 2013-02-03 ENCOUNTER — Encounter: Payer: Self-pay | Admitting: Internal Medicine

## 2013-02-03 NOTE — Progress Notes (Signed)
Patient ID: Elizabeth Mcconnell, female   DOB: 09-08-1945, 67 y.o.   MRN: [id number] Received message from patient re: palpitations. Returned phone call. Patient went for a flutter ablation yesterday. Reports acute onset of palpitations and an "irregular" heart beat this evening. Associated with anxiety. Denies angina, syncope, SOB. Encouraged her to take her home dose of diltiazem. Husband is home with her. If sx progress or fail to improve with this therapy, she plans to present to ED for further evaluation.

## 2013-02-04 ENCOUNTER — Telehealth: Payer: Self-pay | Admitting: Internal Medicine

## 2013-02-04 NOTE — Telephone Encounter (Signed)
New Message  Pt called states that she called the Doctor on call// The dr advised to take the medication that Dr. Johney Frame advised her to stop// and the pt simply wanted to keep Office updated... Pt took the medication but is not sure what to do going forward// Please call back to discuss.[name]

## 2013-02-04 NOTE — Telephone Encounter (Signed)
Patient calling to request advisement from Dr. Johney Frame. She had Ablation on Tuesday, Dec 2nd. Last night (12/3) she experienced very significant dysrhythmia recurrence. States "heart rate was too fast to even measure". She called On Call MD - Dr. Ronney Asters.  He advised her as noted below. Patient states within 30 min, heart rate was "back to normal".  Patient is now unsure if she should continue Diltiazem or not, since Dr. Johney Frame had advised her to stop it pre-Ablation. Currently patient is "okay" with no complaints but just wants Dr. Johney Frame to know her status and provide feedback regarding the Diltiazem. Routed to Dr. Johney Frame.   Progress Notes     Eldridge Scot, MD at 02/03/2013 10:41 PM     Status: Signed        Patient ID: Elizabeth Mcconnell, female DOB: 1945-07-13, 67 y.o. MRN: [id number]  Received message from patient re: palpitations. Returned phone call. Patient went for a flutter ablation yesterday. Reports acute onset of palpitations and an "irregular" heart beat this evening. Associated with anxiety. Denies angina, syncope, SOB. Encouraged her to take her home dose of diltiazem. Husband is home with her. If sx progress or fail to improve with this therapy, she plans to present to ED for further evaluation.

## 2013-02-05 NOTE — Telephone Encounter (Signed)
Resume diltiazem If she has further symptoms, then I will place a 30 day event monitor

## 2013-02-08 NOTE — Telephone Encounter (Signed)
Spoke with patient and she took the Cardizem 360mg  but has only taken it twice.  She is going to let me know if she needs to re-start and when she does.  She will also need an event monitor if she continues to experience these episodes

## 2013-02-17 ENCOUNTER — Telehealth: Payer: Self-pay | Admitting: Internal Medicine

## 2013-02-17 NOTE — Telephone Encounter (Signed)
New problem    Pt needs a call back and has question about med ELIQUIS, please give her a call back.

## 2013-02-17 NOTE — Telephone Encounter (Signed)
Called patient back and she has questions about Eliquis.  She is having terrible joint pain and is wondering if it is related to the Eliquis.  Has been going on for a couple of weeks and just wondering if its the Eliquis  I have suggested she call her PCP and discuss her joint pain and swelling in hands and fingers with her

## 2013-02-19 NOTE — Telephone Encounter (Signed)
Follow UP:  Pt is calling to find out what Elizabeth Mcconnell found out from Dr. Johney Frame. Pt states she is feeling the same as when she spoke with Elizabeth Mcconnell previously. Pt looks forward to speaking with Elizabeth Mcconnell after clinic.

## 2013-02-19 NOTE — Telephone Encounter (Signed)
Discussed with both Kennon Rounds and Riki Rusk in the anti-coag clinic.  Neither of them have seen this.  She has multiple symptoms including arthritis in hands, left leg pain that starts when she sits for extended periods of times.  It's difficult to walk on at first but then it subsides.  No back pain and she does not follow with a PCP at the present.  She is going to see Dr Felicity Coyer in   Feb. 2015.  She is going to go see a chiropractor soon and see if this helps.  She doesn't feel good at times but then days feels great.  Multiple complaints that are hard to pin point.  I have offered her an appointment with Norma Fredrickson, NP on Monday and she doesn't feel it is necessary.

## 2013-02-22 ENCOUNTER — Emergency Department (INDEPENDENT_AMBULATORY_CARE_PROVIDER_SITE_OTHER): Payer: Medicare PPO

## 2013-02-22 ENCOUNTER — Emergency Department (HOSPITAL_COMMUNITY)
Admission: EM | Admit: 2013-02-22 | Discharge: 2013-02-22 | Disposition: A | Discharge status: Home / Self Care | Payer: Medicare PPO | Source: Home / Self Care | Attending: Emergency Medicine | Admitting: Emergency Medicine

## 2013-02-22 ENCOUNTER — Telehealth: Payer: Self-pay

## 2013-02-22 ENCOUNTER — Encounter (HOSPITAL_COMMUNITY): Payer: Self-pay | Admitting: Emergency Medicine

## 2013-02-22 DIAGNOSIS — R42 Dizziness and giddiness: Secondary | ICD-10-CM

## 2013-02-22 DIAGNOSIS — M4310 Spondylolisthesis, site unspecified: Secondary | ICD-10-CM

## 2013-02-22 DIAGNOSIS — M4306 Spondylolysis, lumbar region: Secondary | ICD-10-CM

## 2013-02-22 LAB — POCT I-STAT, CHEM 8
BUN: 8 mg/dL (ref 6–23)
Calcium, Ion: 1.22 mmol/L (ref 1.13–1.30)
Chloride: 107 mEq/L (ref 96–112)
Creatinine, Ser: 0.8 mg/dL (ref 0.50–1.10)
Glucose, Bld: 103 mg/dL — ABNORMAL HIGH (ref 70–99)
HCT: 47 % — ABNORMAL HIGH (ref 36.0–46.0)
Hemoglobin: 16 g/dL — ABNORMAL HIGH (ref 12.0–15.0)
Potassium: 3.9 mEq/L (ref 3.5–5.1)
Sodium: 141 mEq/L (ref 135–145)
TCO2: 24 mmol/L (ref 0–100)

## 2013-02-22 LAB — D-DIMER, QUANTITATIVE: D-Dimer, Quant: <0.27 ug/mL-FEU (ref 0.00–0.48)

## 2013-02-22 MED ORDER — HYDROCODONE-ACETAMINOPHEN 5-325 MG PO TABS
2.0000 | ORAL_TABLET | Freq: Once | ORAL | Status: AC
  Administered 2013-02-22: 2 via ORAL

## 2013-02-22 MED ORDER — OXYCODONE-ACETAMINOPHEN 5-325 MG PO TABS: ORAL_TABLET | ORAL | Status: DC

## 2013-02-22 MED ORDER — MELOXICAM 15 MG PO TABS: 15.0000 mg | ORAL_TABLET | Freq: Every day | ORAL | Status: DC

## 2013-02-22 MED ORDER — HYDROCODONE-ACETAMINOPHEN 5-325 MG PO TABS
ORAL_TABLET | ORAL | Status: AC
  Filled 2013-02-22: qty 2

## 2013-02-22 MED ORDER — METHOCARBAMOL 500 MG PO TABS: 500.0000 mg | ORAL_TABLET | Freq: Three times a day (TID) | ORAL | Status: DC

## 2013-02-22 MED ORDER — MECLIZINE HCL 25 MG PO TABS: 25.0000 mg | ORAL_TABLET | Freq: Three times a day (TID) | ORAL | Status: DC | PRN

## 2013-02-22 NOTE — Telephone Encounter (Signed)
The patient called and is hoping to be worked in sooner for a new pt apt.  She is having flu like symptoms which she is hoping to be seen for.

## 2013-02-22 NOTE — Telephone Encounter (Signed)
Pt can be work in on Friday 02/26/13 for NP appt. If she need to see someone else sooner for acute she can & keep NP for feb...lmb

## 2013-02-22 NOTE — ED Provider Notes (Signed)
Chief Complaint:   Chief Complaint  Patient presents with  . Leg Pain    History of Present Illness:   Elizabeth Mcconnell is a 67 year old female who presents today with a two-week history of pain in her left leg. The pain radiates from her buttock on down to her ankle. It's worse when she first gets up or at nighttime or with inactivity and seems to get better with activity or walking. She denies any numbness or tingling in the leg. There is no muscle weakness. No swelling in the leg or the calf. No distended blood vessels. She denies any bladder or bowel dysfunction. There is no perineal anesthesia. No abdominal pain. She denies fever, chills, or unintended weight loss. [name] had no chest pain or shortness of breath. She denies a prior history of back pain or back problems. She does have a one-year history of neck pain. She had an MRI which showed degenerative disc disease. She also mentions today a three-day history of being dizzy and lightheaded. She describes whirling vertigo. This is positional at times. She denies any difficulty hearing or ringing in the ears. [name] had no headache, blurry vision, diplopia, or neurological symptoms. She feels achy all over, and has felt chilled ever since she's been on blood pressure. She is taking Eliquis for atrial fibrillation. She denies any palpitations or other cardiac symptoms.  Review of Systems:  Other than noted above, the patient denies any of the following symptoms: Systemic:  No fever, chills, severe fatigue, or unexplained weight loss. GI:  No abdominal pain, nausea, vomiting, diarrhea, constipation, incontinence of bowel, or blood in stool. GU:  No dysuria, frequency, urgency, or hematuria. No incontinence of urine or difficulty urinating.  M-S:  No neck pain, joint pain, arthritis, or myalgias. Neuro:  No paresthesias, saddle anesthesia, muscular weakness, or progressive neurological deficit.  PMFSH:  Past medical history, family history,  social history, meds, and allergies were reviewed. Specifically, there is no history of cancer, major trauma, osteoporosis, immunosuppression, or HIV infection. She is allergic to flecainide, penicillin, and Xarelto. Current meds include Advair, Flonase, Singulair, and Eliquis. She has atrial fibrillation and atrial flutter. She also has asthma. Dr. Johney Frame is her cardiologist. She does not have a primary care physician right now.  Physical Exam:   Vital signs:  BP 136/79  Pulse 68  Temp(Src) 98.1 F (36.7 C) (Oral)  Resp 16  SpO2 99% General:  Alert, oriented, in no distress. Eyes: PERRLA, full EOMs, sclera nonicteric. ENT: TMs, nasal mucosa, and oropharynx were clear. Abdomen:  Soft, non-tender.  No organomegaly or mass.  No pulsatile midline abdominal mass or bruit. Back:  Nontender with full range of motion with no pain. Straight leg raising was negative. Neuro:  Normal muscle strength, sensations and DTRs. Cranial nerves were intact, no pronator drift. Extremities: Pedal pulses were full, there was no edema. No calf tenderness and Homans sign was negative. Skin:  Clear, warm and dry.  No rash.  Labs:   Results for orders placed during the hospital encounter of 02/22/13  D-DIMER, QUANTITATIVE      Result Value Range   D-Dimer, Quant <0.27  0.00 - 0.48 ug/mL-FEU  POCT I-STAT, CHEM 8      Result Value Range   Sodium 141  135 - 145 mEq/L   Potassium 3.9  3.5 - 5.1 mEq/L   Chloride 107  96 - 112 mEq/L   BUN 8  6 - 23 mg/dL   Creatinine, Ser 1.61  0.50 - 1.10 mg/dL   Glucose, Bld 562 (*) 70 - 99 mg/dL   Calcium, Ion 1.30  8.65 - 1.30 mmol/L   TCO2 24  0 - 100 mmol/L   Hemoglobin 16.0 (*) 12.0 - 15.0 g/dL   HCT 78.4 (*) 69.6 - 29.5 %     Radiology:  Dg Lumbar Spine Complete  02/22/2013   CLINICAL DATA:  Left leg pain for 2 weeks.  Pain is intermittent.  EXAM: LUMBAR SPINE - COMPLETE 4+ VIEW  COMPARISON:  None.  FINDINGS: There is mild lumbar dextroscoliosis centered at L2-3. There  is approximately 7 mm of anterolisthesis of L4 on L5. Vertebral body heights are maintained. No acute lumbar spine fracture is identified. No pars defects are identified. No significant disc space narrowing.  IMPRESSION: 1. Grade 1 anterolisthesis of L4 on L5. 2. Mild lumbar dextroscoliosis.   Electronically Signed   By: Sebastian Ache   On: 02/22/2013 13:21   EKG Results:  Date: 02/22/2013  Rate: 73  Rhythm: normal sinus rhythm and premature ventricular contractions (PVC)  QRS Axis: right  Intervals: normal  ST/T Wave abnormalities: normal  Conduction Disutrbances:none  Narrative Interpretation: Sinus rhythm with occasional premature ventricular complexes, left axis deviation, low-voltage QRS, and complete right bundle branch block, and possible inferior infarction, age undetermined.  Old EKG Reviewed: none available  Course in Urgent Care Center:   She was given Norco 5/325 2 by mouth for pain.   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 A diagnosis of Vertigo was also pertinent to this visit.  She has a 6.5 mm anterolisthesis of L4 on L5 which is probably the cause for the sciatica symptoms. She will need followup by a neurosurgeon for this. She wants to avoid prednisone, since she has taken a lot of this in the past year for her neck problems. Her vertigo is probably labyrinthine in origin. Suggested she followup with her new primary care physician for this.  Plan:   1.  Meds:  The following meds were prescribed:   Discharge Medication List as of 02/22/2013  2:54 PM    START taking these medications   Details  meclizine (ANTIVERT) 25 MG tablet Take 1 tablet (25 mg total) by mouth 3 (three) times daily as needed for dizziness., Starting 02/22/2013, Until Discontinued, Normal    meloxicam (MOBIC) 15 MG tablet Take 1 tablet (15 mg total) by mouth daily., Starting 02/22/2013, Until Discontinued, Normal    methocarbamol (ROBAXIN) 500 MG tablet Take 1  tablet (500 mg total) by mouth 3 (three) times daily., Starting 02/22/2013, Until Discontinued, Normal    oxyCODONE-acetaminophen (PERCOCET) 5-325 MG per tablet 1 to 2 tablets every 6 hours as needed for pain., Print        2.  Patient Education/Counseling:  The patient was given appropriate handouts, self care instructions, and instructed in symptomatic relief. The patient was encouraged to try to be as active as possible and given some exercises to do followed by moist heat. She was given some back exercises.  3.  Follow up:  The patient was told to follow up if no better in 3 to 4 days, if becoming worse in any way, and given some red flag symptoms such as worsening pain or new neurological symptoms which would prompt immediate return.  Follow up with Dr. Maeola Harman.     Reuben Likes, MD 02/22/13 [phone]

## 2013-02-22 NOTE — ED Notes (Signed)
Pt  Reports  Neck  /  l  Leg  Pain      X  Several  Weeks    She  denys  specefic  Injury  But  Has  Had  Neck problems  In past    She  Also  Reports  Feeling  Dizzy at times

## 2013-03-11 ENCOUNTER — Encounter: Payer: Medicare PPO | Admitting: Internal Medicine

## 2013-03-15 ENCOUNTER — Encounter: Payer: Self-pay | Admitting: Internal Medicine

## 2013-03-15 ENCOUNTER — Ambulatory Visit (INDEPENDENT_AMBULATORY_CARE_PROVIDER_SITE_OTHER): Payer: Medicare PPO | Admitting: Internal Medicine

## 2013-03-15 VITALS — BP 140/82 | HR 77 | Ht 65.0 in | Wt 151.0 lb

## 2013-03-15 DIAGNOSIS — I4891 Unspecified atrial fibrillation: Secondary | ICD-10-CM

## 2013-03-15 DIAGNOSIS — I4892 Unspecified atrial flutter: Secondary | ICD-10-CM

## 2013-03-15 NOTE — Progress Notes (Signed)
PCP: Gwendolyn Grant, MD Primary Cardiologist:  Dr Jamey Ripa Elizabeth Mcconnell is a 68 y.o. female who presents today for routine electrophysiology followup.  Since last being seen in our clinic, the patient reports doing very well.  Her primary concern is with back and L leg pain.  She had brief palpitations after ablation but this has resolved.  Today, she denies symptoms of chest pain, shortness of breath,  lower extremity edema, dizziness, presyncope, or syncope.  The patient is otherwise without complaint today.   Past Medical History  Diagnosis Date  . Tobacco abuse   . Allergic rhinitis   . Oral candidiasis   . Atrial flutter     failed cardioversion 11/2012; s/p ablation 02-02-2013 by Dr Rayann Heman  . Asthma   . Pneumothorax on left 1996    left lower lung collapse s/p resection  . Arthritis   . Osteopenia 07/2011    T score -2.4 FRAX 9.2%/1.2%   Past Surgical History  Procedure Laterality Date  . Lung surgery  02/1965  . Cesarean section      X 2  . Appendectomy    . Total abdominal hysterectomy w/ bilateral salpingoophorectomy  1984    Endometriosis  . Cardioversion N/A 11/27/2012    Procedure: CARDIOVERSION;  Surgeon: Thayer Headings, MD;  Location: [place];  Service: Cardiovascular;  Laterality: N/A;  . Ablation  02-02-2013    CTI ablation by Dr Rayann Heman     Current Outpatient Prescriptions  Medication Sig Dispense Refill  . acetaminophen (TYLENOL) 500 MG tablet Take 500 mg by mouth every 6 (six) hours as needed for pain.      [name] apixaban (ELIQUIS) 5 MG TABS tablet Take 5 mg by mouth 2 (two) times daily.      . Calcium Carbonate-Vitamin D (CALCIUM-VITAMIN D) 500-200 MG-UNIT per tablet Take 1 tablet by mouth daily.        . Cholecalciferol (VITAMIN D-3) 5000 UNITS TABS Take 5,000 Units by mouth every other day.      . estradiol (VIVELLE-DOT) 0.075 MG/24HR Place 1 patch onto the skin 2 (two) times a week.      . fluticasone (FLONASE) 50 MCG/ACT nasal spray Place 1 spray  into both nostrils 2 (two) times daily.      . Fluticasone-Salmeterol (ADVAIR DISKUS) 250-50 MCG/DOSE AEPB Inhale 1 puff into the lungs every 12 (twelve) hours.      [name] levalbuterol (XOPENEX HFA) 45 MCG/ACT inhaler Inhale 1-2 puffs into the lungs every 4 (four) hours as needed for shortness of breath.      . montelukast (SINGULAIR) 10 MG tablet Take 10 mg by mouth every morning.      [name] OVER THE COUNTER MEDICATION Take 1 tablet by mouth daily as needed (sleep). Herbal medication (melatonin, lemon balm, camomile, lavender.)      . oxyCODONE-acetaminophen (PERCOCET/ROXICET) 5-325 MG per tablet 1 to 2 tablets every 6 hours as needed for pain. (Pt is taking 1/2 tablet at night to help her with pain/sleep)      . QVAR 80 MCG/ACT inhaler 1 puff as needed.       No current facility-administered medications for this visit.    Physical Exam: Filed Vitals:   03/15/13 1051  BP: 140/82  Pulse: 77  Height: 5\' 5"  (1.651 m)  Weight: 151 lb (68.493 kg)    GEN- The patient is well appearing, alert and oriented x 3 today.   Head- normocephalic, atraumatic Eyes-  Sclera clear, conjunctiva pink Ears- hearing  intact Oropharynx- clear Lungs- Clear to ausculation bilaterally, normal work of breathing Heart- Regular rate and rhythm, no murmurs, rubs or gallops, PMI not laterally displaced GI- soft, NT, ND, + BS Extremities- no clubbing, cyanosis, or edema  ekg today reveals sinus rhythm with incomplete RBBB  Assessment and Plan:  1. Atrial flutter Doing well s/p ablation Stop eliquis If she develops afib in the future then she will need to have anticoagulation restarted at that time   Follow-up with Dr Burt Knack as scheduled I will see as needed going forward

## 2013-03-15 NOTE — Patient Instructions (Addendum)
Your physician recommends that you schedule a follow-up appointment in 6 months with Dr Burt Knack and as needed with Dr Rayann Heman  Your physician has recommended you make the following change in your medication:  1) Stop Eliquis

## 2013-03-26 ENCOUNTER — Telehealth: Payer: Self-pay | Admitting: Internal Medicine

## 2013-03-26 NOTE — Telephone Encounter (Signed)
Spoke with patient, she says she feels very anxious (like panic attack), can not focus, can't catch her breath, etc.  She went to [facility] and took her BP at the automated machine got elevated readings.  I told her I do not always trust those machines and offered her to come here for BP check on Mon.  She wants something to help "calm" her down.  She is due to establish with new PCP on 04/22/13.  She does have an appointment on Mon with Pulmonary and I let her know they would take her BP then.  I explained to her that when she starts feeling like she is going to panic this could cause her BP to rise. She is going to discuss with MD on Mon and see if she can get her 2/19 appointment moved up. She appreciated my call and will call back if needed

## 2013-03-26 NOTE — Telephone Encounter (Signed)
New message    [facility] 190/99 pulse 90---55min later 180-103 puls 91.  Pt is very "anxious"  Pt had an ablation on 02-02-13.  Pt is not on bp medication

## 2013-03-29 ENCOUNTER — Encounter: Payer: Self-pay | Admitting: Critical Care Medicine

## 2013-03-29 ENCOUNTER — Ambulatory Visit (INDEPENDENT_AMBULATORY_CARE_PROVIDER_SITE_OTHER): Payer: Medicare PPO | Admitting: Critical Care Medicine

## 2013-03-29 ENCOUNTER — Telehealth: Payer: Self-pay | Admitting: Critical Care Medicine

## 2013-03-29 VITALS — BP 146/90 | HR 94 | Temp 98.6°F | Ht 65.0 in | Wt 150.8 lb

## 2013-03-29 DIAGNOSIS — J45909 Unspecified asthma, uncomplicated: Secondary | ICD-10-CM

## 2013-03-29 MED ORDER — DOXYCYCLINE HYCLATE 100 MG PO TBEC: 100.0000 mg | DELAYED_RELEASE_TABLET | Freq: Two times a day (BID) | ORAL | Status: DC

## 2013-03-29 NOTE — Telephone Encounter (Signed)
Spoke with Hildred Alamin at [facility]. She had a question about pt's doxy prescription. Doryx was sent over and that only comes in 150mg  and 200mg  tablets. Tinnie Gens that the pt just needs regular Doxy. She filled the rx for the pt. Nothing further was needed.

## 2013-03-29 NOTE — Patient Instructions (Addendum)
Doxycycline 100mg  twice daily for 5days No other medication changes Try melatonin 6mg  at bedtime for sleep (3mg  tablet, can try two) Return 3 months

## 2013-03-29 NOTE — Progress Notes (Signed)
Subjective:    Patient ID: Elizabeth Mcconnell, female    DOB: 04/08/1945, 68 y.o.   MRN: [id number]  HPI  68 yo WF with known hx of  Severe atopic asthma with extremely high IG levels, paroxysmal atrial fibrillation felt to be secondary to albuterol, allergic rhinitis,  Former smoker .  Prior history of recurrent spontaneous pneumothorax, with left lower lobe lobectomy.  03/29/2013 Chief Complaint  Patient presents with  . 4 month follow up    Feels breathing is at baseline but coughing x 3-4 wks.  No wheezing, chest tightness/pain, or fever.  Pt notes ongoing cough, ?caught virus around holidays.  Pt had doxy 5 day supply, took med on its own.  Doxy seemed to help.  Pt with sciatic nerve issues in L leg.  Cough was productive, looked green.  Now less prod cough, not as much. Notes some pndrip, will sneeze. No chest pain. No wheeze. Pt denies any significant sore throat, nasal congestion or excess secretions, fever, chills, sweats, unintended weight loss, pleurtic or exertional chest pain, orthopnea PND, or leg swelling Pt denies any increase in rescue therapy over baseline, denies waking up needing it or having any early am or nocturnal exacerbations of coughing/wheezing/or dyspnea. Pt also denies any obvious fluctuation in symptoms with  weather or environmental change or other alleviating or aggravating factors    Review of Systems  Constitutional:   No  weight loss, night sweats,  Fevers, chills, fatigue, or  lassitude.  HEENT:   No headaches,  Difficulty swallowing,  Tooth/dental problems, or  Sore throat,                No sneezing, itching, ear ache, nasal congestion, post nasal drip,   CV:  No chest pain,  Orthopnea, PND, swelling in lower extremities, anasarca, dizziness, palpitations, syncope.   GI  No heartburn, indigestion, abdominal pain, nausea, vomiting, diarrhea, change in bowel habits, loss of appetite, bloody stools.   Resp:   No chest wall deformity  Skin: no  rash or lesions.  GU: no dysuria, change in color of urine, no urgency or frequency.  No flank pain, no hematuria   MS:  No joint pain or swelling.  No decreased range of motion.  No back pain.  Psych:  No change in mood or affect. No depression or anxiety.  No memory loss.     Objective:   Physical Exam BP 146/90  Pulse 94  Temp(Src) 98.6 F (37 C) (Oral)  Ht 5\' 5"  (1.651 m)  Wt 150 lb 12.8 oz (68.402 kg)  BMI 25.09 kg/m2  SpO2 97%  GEN: A/Ox3; pleasant , NAD, well nourished   HEENT:  [place]/AT,  EACs-clear, TMs-wnl, NOSE-clear, THROAT-clear, no lesions, no postnasal drip or exudate noted.   NECK:  Supple w/ fair ROM; no JVD; normal carotid impulses w/o bruits; no thyromegaly or nodules palpated; no lymphadenopathy.  RESP clearer , no accessory muscle use, no dullness to percussion  CARD:  RRR, no m/r/g  , no peripheral edema, pulses intact, no cyanosis or clubbing.  GI:   Soft & nt; nml bowel sounds; no organomegaly or masses detected.  Musco: Warm bil, no deformities or joint swelling noted.   Neuro: alert, no focal deficits noted.    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 acetaminophen (TYLENOL) 500 MG tablet Take 500 mg by mouth every 6 (six) hours as needed for pain.  . Calcium Carbonate-Vitamin D (CALCIUM-VITAMIN D) 500-200 MG-UNIT per tablet Take 1 tablet by mouth daily.    . Cholecalciferol (VITAMIN D-3) 5000 UNITS TABS Take 5,000 Units by mouth every other day.  . estradiol (VIVELLE-DOT) 0.075 MG/24HR Place 1 patch onto the skin 2 (two) times a week.  . fluticasone (FLONASE) 50 MCG/ACT nasal spray Place 1 spray into both nostrils 2 (two) times daily.   . Fluticasone-Salmeterol (ADVAIR DISKUS) 250-50 MCG/DOSE AEPB Inhale 1 puff into the lungs every 12 (twelve) hours.  [name] levalbuterol (XOPENEX HFA) 45 MCG/ACT inhaler Inhale 1-2 puffs into the lungs every 4 (four) hours as needed for shortness of breath.  . montelukast (SINGULAIR) 10 MG tablet Take 10 mg by mouth every morning.  [name] OVER THE COUNTER MEDICATION Take 1 tablet by mouth daily as needed (sleep). Herbal medication (melatonin, lemon Mcconnell, camomile, lavender.)  . oxyCODONE-acetaminophen (PERCOCET/ROXICET) 5-325 MG per tablet 1 to 2 tablets every 6 hours as needed for pain. (Pt is taking 1/2 tablet at night to help her with pain/sleep)  . [DISCONTINUED] QVAR 80 MCG/ACT inhaler 1 puff as needed.  . doxycycline (DORYX) 100 MG EC tablet Take 1 tablet (100 mg total) by mouth 2 (two) times daily.

## 2013-03-30 NOTE — Assessment & Plan Note (Signed)
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

## 2013-04-01 ENCOUNTER — Other Ambulatory Visit: Payer: Self-pay | Admitting: Neurosurgery

## 2013-04-01 DIAGNOSIS — M4310 Spondylolisthesis, site unspecified: Secondary | ICD-10-CM

## 2013-04-08 ENCOUNTER — Telehealth: Payer: Self-pay | Admitting: Critical Care Medicine

## 2013-04-08 MED ORDER — CLARITHROMYCIN 500 MG PO TABS: 500.0000 mg | ORAL_TABLET | Freq: Two times a day (BID) | ORAL | Status: DC

## 2013-04-08 NOTE — Telephone Encounter (Signed)
Spoke with pt. Saw PW on 03/29/13. Reports coughing with production of green mucus, chest congestion, chest tightness, SOB. Denies fever or wheezing.  PW gave her Doxy x5 days last week, she finished this. Would like other recs, but DOES NOT WANT PREDNISONE.  Allergies  Allergen Reactions  . Tramadol Other (See Comments)    Severe dizziness, weakness  . Amoxicillin-Pot Clavulanate Nausea And Vomiting  . Flecainide     dizziness  . Moxifloxacin Hives, Itching and Rash    Current Outpatient Prescriptions on File Prior to Visit  Medication Sig Dispense Refill  . acetaminophen (TYLENOL) 500 MG tablet Take 500 mg by mouth every 6 (six) hours as needed for pain.      . Calcium Carbonate-Vitamin D (CALCIUM-VITAMIN D) 500-200 MG-UNIT per tablet Take 1 tablet by mouth daily.        . Cholecalciferol (VITAMIN D-3) 5000 UNITS TABS Take 5,000 Units by mouth every other day.      [name] doxycycline (DORYX) 100 MG EC tablet Take 1 tablet (100 mg total) by mouth 2 (two) times daily.  10 tablet  0  . estradiol (VIVELLE-DOT) 0.075 MG/24HR Place 1 patch onto the skin 2 (two) times a week.      . fluticasone (FLONASE) 50 MCG/ACT nasal spray Place 1 spray into both nostrils 2 (two) times daily.      . Fluticasone-Salmeterol (ADVAIR DISKUS) 250-50 MCG/DOSE AEPB Inhale 1 puff into the lungs every 12 (twelve) hours.      [name] levalbuterol (XOPENEX HFA) 45 MCG/ACT inhaler Inhale 1-2 puffs into the lungs every 4 (four) hours as needed for shortness of breath.      . montelukast (SINGULAIR) 10 MG tablet Take 10 mg by mouth every morning.      [name] OVER THE COUNTER MEDICATION Take 1 tablet by mouth daily as needed (sleep). Herbal medication (melatonin, lemon balm, camomile, lavender.)      . oxyCODONE-acetaminophen (PERCOCET/ROXICET) 5-325 MG per tablet 1 to 2 tablets every 6 hours as needed for pain. (Pt is taking 1/2 tablet at night to help her with pain/sleep)       No current facility-administered medications on file  prior to visit.    CY - please advise. Thanks.

## 2013-04-08 NOTE — Telephone Encounter (Signed)
Pt is aware of CY's recs. Rx has been sent in. 

## 2013-04-08 NOTE — Telephone Encounter (Signed)
Elizabeth Mcconnell suggests this is a bacterial infection. Offer biaxin 500 mg, # 20, 1 twice daily after meals.

## 2013-04-10 ENCOUNTER — Ambulatory Visit
Admission: RE | Admit: 2013-04-10 | Discharge: 2013-04-10 | Disposition: A | Discharge status: Home / Self Care | Payer: Medicare PPO | Source: Ambulatory Visit | Attending: Neurosurgery | Admitting: Neurosurgery

## 2013-04-10 DIAGNOSIS — M4310 Spondylolisthesis, site unspecified: Secondary | ICD-10-CM

## 2013-04-15 ENCOUNTER — Telehealth: Payer: Self-pay | Admitting: *Deleted

## 2013-04-15 NOTE — Telephone Encounter (Signed)
[employer] faxed PA stating medication required P for vivelle dot patch 0.075 mg, this form was filled out and faxed to [facility] will wait for approval.

## 2013-04-16 NOTE — Telephone Encounter (Signed)
vivelle dot patch 0.075 mg was denied by [facility].

## 2013-04-22 ENCOUNTER — Ambulatory Visit (INDEPENDENT_AMBULATORY_CARE_PROVIDER_SITE_OTHER): Payer: Medicare PPO | Admitting: Internal Medicine

## 2013-04-22 ENCOUNTER — Encounter: Payer: Self-pay | Admitting: Internal Medicine

## 2013-04-22 VITALS — BP 134/98 | HR 94 | Temp 98.4°F | Ht 65.0 in | Wt 149.0 lb

## 2013-04-22 DIAGNOSIS — G8929 Other chronic pain: Secondary | ICD-10-CM | POA: Insufficient documentation

## 2013-04-22 DIAGNOSIS — M5432 Sciatica, left side: Secondary | ICD-10-CM | POA: Insufficient documentation

## 2013-04-22 DIAGNOSIS — F411 Generalized anxiety disorder: Secondary | ICD-10-CM

## 2013-04-22 DIAGNOSIS — J45909 Unspecified asthma, uncomplicated: Secondary | ICD-10-CM

## 2013-04-22 DIAGNOSIS — M5430 Sciatica, unspecified side: Secondary | ICD-10-CM

## 2013-04-22 DIAGNOSIS — M542 Cervicalgia: Secondary | ICD-10-CM

## 2013-04-22 DIAGNOSIS — I4892 Unspecified atrial flutter: Secondary | ICD-10-CM

## 2013-04-22 MED ORDER — ALPRAZOLAM 0.25 MG PO TABS: 0.2500 mg | ORAL_TABLET | Freq: Two times a day (BID) | ORAL | Status: DC | PRN

## 2013-04-22 NOTE — Assessment & Plan Note (Signed)
Moderate persistent symptoms, currently well-controlled on maintenance therapy Follows with pulmonary for same, interval history review The current medical regimen is effective;  continue present plan and medications.

## 2013-04-22 NOTE — Assessment & Plan Note (Signed)
Ongoing symptoms >6 mo - worst arising from bed/sleep in AM -  Pain ok with ambulation and sitting/standing/activity once up currently working with neurosurgery and chiropractic medicine for same Reviewed MRI from January 2015, no clear indication for surgical intervention may consider facet injection or ESI as indicated - follow up NSurg as planned Continue physical therapy as ongoing Reviewed and encourage compliance with scheduled Tylenol 1 g twice a day Patient will call if symptoms worse or unimproved

## 2013-04-22 NOTE — Assessment & Plan Note (Signed)
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 FH same - sister on sertraline Denies SI/HI, request when necessary medication at this time Low-dose Xanax provided, discussed role for SSRI if daily use of benzodiazepine as needed Patient will followup in next 4-6 weeks on use of medication and symptoms for her annual physical and labs  To review in further detail

## 2013-04-22 NOTE — Patient Instructions (Addendum)
It was good to see you today.  We have reviewed your prior records including labs and tests today  Medications reviewed and updated Try low dose xanax as needed for anxiety/panic -no other changes recommended at this time.  Your prescription(s) have been submitted to your pharmacy. Please take as directed and contact our office if you believe you are having problem(s) with the medication(s).  Continue working with your other specialists as reviewed  Please schedule followup in next few weeks for annual exam and labs, call sooner if problems.  Panic Attacks Panic attacks are sudden, short-livedsurges of severe anxiety, fear, or discomfort. They may occur for no reason when you are relaxed, when you are anxious, or when you are sleeping. Panic attacks may occur for a number of reasons:   Healthy people occasionally have panic attacks in extreme, life-threatening situations, such as war or natural disasters. Normal anxiety is a protective mechanism of the body that helps [place] react to danger (fight or flight response).  Panic attacks are often seen with anxiety disorders, such as panic disorder, social anxiety disorder, generalized anxiety disorder, and phobias. Anxiety disorders cause excessive or uncontrollable anxiety. They may interfere with your relationships or other life activities.  Panic attacks are sometimes seen with other mental illnesses such as depression and posttraumatic stress disorder.  Certain medical conditions, prescription medicines, and drugs of abuse can cause panic attacks. SYMPTOMS  Panic attacks start suddenly, peak within 20 minutes, and are accompanied by four or more of the following symptoms:  Pounding heart or fast heart rate (palpitations).  Sweating.  Trembling or shaking.  Shortness of breath or feeling smothered.  Feeling choked.  Chest pain or discomfort.  Nausea or strange feeling in your stomach.  Dizziness, lightheadedness, or feeling like  you will faint.  Chills or hot flushes.  Numbness or tingling in your lips or hands and feet.  Feeling that things are not real or feeling that you are not yourself.  Fear of losing control or going crazy.  Fear of dying. Some of these symptoms can mimic serious medical conditions. For example, you may think you are having a heart attack. Although panic attacks can be very scary, they are not life threatening. DIAGNOSIS  Panic attacks are diagnosed through an assessment by your health care provider. Your health care provider will ask questions about your symptoms, such as where and when they occurred. Your health care provider will also ask about your medical history and use of alcohol and drugs, including prescription medicines. Your health care provider may order blood tests or other studies to rule out a serious medical condition. Your health care provider may refer you to a mental health professional for further evaluation. TREATMENT   Most healthy people who have one or two panic attacks in an extreme, life-threatening situation will not require treatment.  The treatment for panic attacks associated with anxiety disorders or other mental illness typically involves counseling with a mental health professional, medicine, or a combination of both. Your health care provider will help determine what treatment is best for you.  Panic attacks due to physical illness usually goes away with treatment of the illness. If prescription medicine is causing panic attacks, talk with your health care provider about stopping the medicine, decreasing the dose, or substituting another medicine.  Panic attacks due to alcohol or drug abuse goes away with abstinence. Some adults need professional help in order to stop drinking or using drugs. HOME CARE INSTRUCTIONS   Take  all your medicines as prescribed.   Check with your health care provider before starting new prescription or over-the-counter  medicines.  Keep all follow up appointments with your health care provider. SEEK MEDICAL CARE IF:  You are not able to take your medicines as prescribed.  Your symptoms do not improve or get worse. SEEK IMMEDIATE MEDICAL CARE IF:   You experience panic attack symptoms that are different than your usual symptoms.  You have serious thoughts about hurting yourself or others.  You are taking medicine for panic attacks and have a serious side effect. MAKE SURE YOU:  Understand these instructions.  Will watch your condition.  Will get help right away if you are not doing well or get worse. Document Released: 02/18/2005 Document Revised: 12/09/2012 Document Reviewed: 10/02/2012 [facility] Patient Information 2014 [place].

## 2013-04-22 NOTE — Assessment & Plan Note (Signed)
s/p RFA 02/2013 for same -  Has discontinued anticoagulation at this time given normal sinus rhythm, follows with cardiology for same Interval history reviewed, no changes recommended at this

## 2013-04-22 NOTE — Progress Notes (Signed)
Pre-visit discussion using our clinic review tool. No additional management support is needed unless otherwise documented below in the visit note.  

## 2013-04-22 NOTE — Progress Notes (Signed)
Subjective:    Patient ID: Elizabeth Mcconnell, female    DOB: 09-18-1945, 68 y.o.   MRN: [id number]  HPI  New patient to me and her primary care division, here to establish with new PCP Reviewed chronic medical issues today  Chronic neck, back and left leg sciatica symptoms. Ongoing greater than 6 months. Treatment in 2014 with prolonged course of steroids by prior PCP causing other adverse consequences. Now off steroids and controlling pain with Tylenol, physical therapy, chiropractic manipulation and ongoing evaluation by neurosurgery. Reports no evidence for surgical intervention, no recommendation for ESI or other intervention. Reports great relief with chiropractic care> Physical therapy.  Anxiety. Exacerbated by acute medical illnesses and retirement from career in past 6 months. Denies prior history of same. New panic attacks awaking from sleep, especially following trial of tramadol as for pain symptoms noted above. associated with insomnia, but has improved with discontinuation of tramadol and initiation of over-the-counter melatonin. Family history of same, sister uses sertraline to control symptoms. Interested in when necessary medication to control occasional symptoms at this time -  A flutter - follows with cardiology for same and much improved since ablation December 2014. Had failed cardioversion, no longer on anticoagulation. Denies chest tightness, palpitations or edema  Chronic extrinsic asthma. Controlled on current medications. Follows with pulmonology for same. Reports compliance with currently prescribed therapy  Past Medical History  Diagnosis Date  . Tobacco abuse   . Allergic rhinitis   . Oral candidiasis   . Atrial flutter     failed cardioversion 11/2012; s/p ablation 02-02-2013 by Dr Rayann Heman  . Asthma   . Pneumothorax on left 1996    left lower lung collapse s/p resection  . Arthritis   . Osteopenia 07/2011    T score -2.4 FRAX 9.2%/1.2%  . ALLERGIC RHINITIS    . ANXIETY   . Endometriosis    Family History  Problem Relation Age of Onset  . Stroke Father 18  . Hypertension Father   . Heart disease Father   . Diabetes Mother    History  Substance Use Topics  . Smoking status: Former Smoker -- 0.50 packs/day for 20 years    Quit date: 03/04/2004  . Smokeless tobacco: Never Used  . Alcohol Use: 7.0 oz/week    14 drink(s) per week     Comment: social    Review of Systems  Constitutional: Negative for fatigue and unexpected weight change.  Respiratory: Negative for cough, shortness of breath and wheezing.   Cardiovascular: Negative for chest pain, palpitations and leg swelling.  Gastrointestinal: Negative for nausea, abdominal pain and diarrhea.  Musculoskeletal: Positive for back pain (chronic), neck pain (chronic) and neck stiffness.       LLE pain laterally buttock to mid calf when standing from supine position (worst in AM arising from bed) - ok with standing, sitting and activity  Neurological: Negative for dizziness, weakness, light-headedness and headaches.  Psychiatric/Behavioral: Positive for sleep disturbance. Negative for suicidal ideas, hallucinations, self-injury, dysphoric mood and decreased concentration. The patient is nervous/anxious. The patient is not hyperactive.   All other systems reviewed and are negative.       Objective:   Physical Exam  BP 134/98  Pulse 94  Temp(Src) 98.4 F (36.9 C) (Oral)  Ht 5\' 5"  (1.651 m)  Wt 149 lb (67.586 kg)  BMI 24.79 kg/m2  SpO2 98% Wt Readings from Last 3 Encounters:  04/22/13 149 lb (67.586 kg)  03/29/13 150 lb 12.8 oz (68.402 kg)  03/15/13 151 lb (68.493 kg)   Constitutional: She is overweight, but appears well-developed and well-nourished. No distress.  HENT: Head: Normocephalic and atraumatic. Ears: B TMs ok, no erythema or effusion; Nose: Nose normal. Mouth/Throat: Oropharynx is clear and moist. No oropharyngeal exudate.  Eyes: Conjunctivae and EOM are normal. Pupils  are equal, round, and reactive to light. No scleral icterus.  Neck: Normal range of motion. Neck supple. No JVD present. No thyromegaly present.  Cardiovascular: Normal rate, regular rhythm and normal heart sounds.  No murmur heard. No BLE edema. Pulmonary/Chest: Effort normal and breath sounds normal. No respiratory distress. She has no wheezes.  Abdominal: Soft. Bowel sounds are normal. She exhibits no distension. There is no tenderness. no masses Musculoskeletal: Normal range of motion, no joint effusions. No gross deformities. Back: full range of motion of thoracic and lumbar spine. Non tender to palpation. Negative straight leg raise. DTR's are symmetrically intact. Sensation intact in all dermatomes of the lower extremities. Full strength to manual muscle testing. patient is able to heel toe walk without difficulty and ambulates with antalgic gait. Neurological: She is alert and oriented to person, place, and time. No cranial nerve deficit. Coordination, balance, strength, speech and gait are normal.  Skin: Skin is warm and dry. No rash noted. No erythema.  Psychiatric: She has a mildly anxious mood and affect. Her behavior is normal. Judgment and thought content normal.     Lab Results  Component Value Date   WBC 9.3 01/25/2013   HGB 16.0* 02/22/2013   HCT 47.0* 02/22/2013   PLT 218.0 01/25/2013   GLUCOSE 103* 02/22/2013   ALT 23 10/27/2012   AST 17 10/27/2012   NA 141 02/22/2013   K 3.9 02/22/2013   CL 107 02/22/2013   CREATININE 0.80 02/22/2013   BUN 8 02/22/2013   CO2 25 01/25/2013   TSH 1.21 08/08/2008   INR 1.2* 01/25/2013    Mr Lumbar Spine Wo Contrast  04/10/2013   CLINICAL DATA:  Low back pain and left leg pain.  EXAM: MRI LUMBAR SPINE WITHOUT CONTRAST  TECHNIQUE: Multiplanar, multisequence MR imaging was performed. No intravenous contrast was administered.  COMPARISON:  Radiography 02/22/2013  FINDINGS: There is mild curvature convex to the right with the apex at L3. There  are insignificant disc bulges at L2-3 and L3-4. There is mild facet and ligamentous prominence at L2-3 and L3-4. There is no stenosis or neural compression.  L4-5: Advanced bilateral facet arthropathy with anterolisthesis of 4 mm. Bulging of the disc. Stenosis of both lateral recesses that could cause neural compression. This could worsen with standing or flexion.  L5-S1: Bilateral facet degeneration with 1 mm of anterolisthesis. Annular fissures with annular bulging. No compressive stenosis.  IMPRESSION: Advanced facet arthropathy at L4-5 with anterolisthesis of 4 mm. Bulging of the disc. Stenosis of both lateral recesses that could be symptomatic. This could worsen with standing or flexion.  Facet arthropathy at L5-S1 with 1 mm of anterolisthesis. Annular fissures an annular bulging. No stenosis or neural compression.   Electronically Signed   By: Nelson Chimes M.D.   On: 04/10/2013 11:00       Assessment & Plan:   Problem List Items Addressed This Visit   ANXIETY - Primary     Largely situation in past 6 mo with acute medical illness ( neck pain, sciatica, atrial flutter s/p RFA), retirement from her career, death of spouse and 06/12/11 and remarriage with move to new home in 06-11-2012. FH same - sister  on sertraline Denies SI/HI, request when necessary medication at this time Low-dose Xanax provided, discussed role for SSRI if daily use of benzodiazepine as needed Patient will followup in next 4-6 weeks on use of medication and symptoms for her annual physical and labs  To review in further detail    Relevant Medications      ALPRAZolam (XANAX) tablet   Atrial flutter     s/p RFA 02/2013 for same -  Has discontinued anticoagulation at this time given normal sinus rhythm, follows with cardiology for same Interval history reviewed, no changes recommended at this    Left sided sciatica     Ongoing symptoms >6 mo - worst arising from bed/sleep in AM -  Pain ok with ambulation and  sitting/standing/activity once up currently working with neurosurgery and chiropractic medicine for same Reviewed MRI from January 2015, no clear indication for surgical intervention may consider facet injection or ESI as indicated - follow up NSurg as planned Continue physical therapy as ongoing Reviewed and encourage compliance with scheduled Tylenol 1 g twice a day Patient will call if symptoms worse or unimproved    Moderate persistent asthma with significant atopic features     Moderate persistent symptoms, currently well-controlled on maintenance therapy Follows with pulmonary for same, interval history review The current medical regimen is effective;  continue present plan and medications.     Neck pain, chronic       Time spent with pt today 45 minutes, greater than 50% time spent counseling patient on LBP/neck pain and LLE sciatica symptoms, anxiety/panic, asthma hx and medication review. Also review of prior records

## 2013-04-23 ENCOUNTER — Telehealth: Payer: Self-pay | Admitting: *Deleted

## 2013-04-23 NOTE — Telephone Encounter (Signed)
[employer] approved Alora 0.075 patch until 03/03/2014.

## 2013-05-24 LAB — HM MAMMOGRAPHY

## 2013-05-25 ENCOUNTER — Encounter: Payer: Self-pay | Admitting: Internal Medicine

## 2013-06-01 ENCOUNTER — Telehealth: Payer: Self-pay | Admitting: Internal Medicine

## 2013-06-01 NOTE — Telephone Encounter (Signed)
Tejal (Tae-jewel), patient's therapist at [facility] is calling requesting to discuss the patient's current assessment. The patient has informed them that she is coming to see Dr. Jason Nest tomorrow and Adolm Joseph wants to relay some information to Dr. Asa Lente before the patient's appointment. She can be reached at [phone].

## 2013-06-01 NOTE — Telephone Encounter (Signed)
Called Tejal back she was wanting to talk with the md actually. She states wanting to give md some feed back on her assessment. Pt has been complaining of dizziness. Did do assessment for vertigo but it appears to be negative. Requesting md to give her a call back in the morning before pt appt @ 10:15...[name]

## 2013-06-02 ENCOUNTER — Encounter: Payer: Self-pay | Admitting: *Deleted

## 2013-06-02 ENCOUNTER — Encounter: Payer: Self-pay | Admitting: Internal Medicine

## 2013-06-02 ENCOUNTER — Ambulatory Visit (INDEPENDENT_AMBULATORY_CARE_PROVIDER_SITE_OTHER): Payer: Medicare PPO | Admitting: Internal Medicine

## 2013-06-02 ENCOUNTER — Other Ambulatory Visit (INDEPENDENT_AMBULATORY_CARE_PROVIDER_SITE_OTHER): Payer: Medicare PPO

## 2013-06-02 ENCOUNTER — Other Ambulatory Visit: Payer: Medicare PPO

## 2013-06-02 VITALS — BP 128/78 | HR 78 | Temp 97.5°F | Wt 151.8 lb

## 2013-06-02 DIAGNOSIS — Z136 Encounter for screening for cardiovascular disorders: Secondary | ICD-10-CM

## 2013-06-02 DIAGNOSIS — F411 Generalized anxiety disorder: Secondary | ICD-10-CM

## 2013-06-02 DIAGNOSIS — G8929 Other chronic pain: Secondary | ICD-10-CM

## 2013-06-02 DIAGNOSIS — M542 Cervicalgia: Secondary | ICD-10-CM

## 2013-06-02 DIAGNOSIS — Z0000 Encounter for general adult medical examination without abnormal findings: Secondary | ICD-10-CM

## 2013-06-02 DIAGNOSIS — H8110 Benign paroxysmal vertigo, unspecified ear: Secondary | ICD-10-CM

## 2013-06-02 DIAGNOSIS — Z1211 Encounter for screening for malignant neoplasm of colon: Secondary | ICD-10-CM

## 2013-06-02 LAB — LIPID PANEL
Cholesterol: 257 mg/dL — ABNORMAL HIGH (ref 0–200)
HDL: 80.5 mg/dL (ref >39.00)
LDL Cholesterol: 151 mg/dL — ABNORMAL HIGH (ref 0–99)
Total CHOL/HDL Ratio: 3
Triglycerides: 126 mg/dL (ref 0.0–149.0)
VLDL: 25.2 mg/dL (ref 0.0–40.0)

## 2013-06-02 LAB — URINALYSIS, ROUTINE W REFLEX MICROSCOPIC
Bilirubin Urine: NEGATIVE
Hgb urine dipstick: NEGATIVE
Ketones, ur: NEGATIVE
Leukocytes, UA: NEGATIVE
Nitrite: NEGATIVE
RBC / HPF: NONE SEEN (ref 0–?)
Specific Gravity, Urine: <=1.005 — AB (ref 1.000–1.030)
Total Protein, Urine: NEGATIVE
Urine Glucose: NEGATIVE
Urobilinogen, UA: 0.2 (ref 0.0–1.0)
WBC, UA: NONE SEEN (ref 0–?)
pH: 6.5 (ref 5.0–8.0)

## 2013-06-02 LAB — CBC WITH DIFFERENTIAL/PLATELET
Basophils Absolute: 0 10*3/uL (ref 0.0–0.1)
Basophils Relative: 0.4 % (ref 0.0–3.0)
Eosinophils Absolute: 0.1 10*3/uL (ref 0.0–0.7)
Eosinophils Relative: 1.3 % (ref 0.0–5.0)
HCT: 43.6 % (ref 36.0–46.0)
Hemoglobin: 14.6 g/dL (ref 12.0–15.0)
Lymphocytes Relative: 20.6 % (ref 12.0–46.0)
Lymphs Abs: 1.9 10*3/uL (ref 0.7–4.0)
MCHC: 33.4 g/dL (ref 30.0–36.0)
MCV: 86.7 fl (ref 78.0–100.0)
Monocytes Absolute: 0.6 10*3/uL (ref 0.1–1.0)
Monocytes Relative: 6.1 % (ref 3.0–12.0)
Neutro Abs: 6.7 10*3/uL (ref 1.4–7.7)
Neutrophils Relative %: 71.6 % (ref 43.0–77.0)
Platelets: 179 10*3/uL (ref 150.0–400.0)
RBC: 5.03 Mil/uL (ref 3.87–5.11)
RDW: 15.1 % — ABNORMAL HIGH (ref 11.5–14.6)
WBC: 9.3 10*3/uL (ref 4.5–10.5)

## 2013-06-02 LAB — HEPATIC FUNCTION PANEL
ALT: 16 U/L (ref 0–35)
AST: 18 U/L (ref 0–37)
Albumin: 4.8 g/dL (ref 3.5–5.2)
Alkaline Phosphatase: 76 U/L (ref 39–117)
Bilirubin, Direct: 0.1 mg/dL (ref 0.0–0.3)
Total Bilirubin: 1.4 mg/dL — ABNORMAL HIGH (ref 0.3–1.2)
Total Protein: 7.5 g/dL (ref 6.0–8.3)

## 2013-06-02 LAB — BASIC METABOLIC PANEL
BUN: 13 mg/dL (ref 6–23)
CO2: 27 mEq/L (ref 19–32)
Calcium: 9.4 mg/dL (ref 8.4–10.5)
Chloride: 102 mEq/L (ref 96–112)
Creatinine, Ser: 0.8 mg/dL (ref 0.4–1.2)
GFR: 77.68 mL/min (ref >60.00)
Glucose, Bld: 101 mg/dL — ABNORMAL HIGH (ref 70–99)
Potassium: 3.9 mEq/L (ref 3.5–5.1)
Sodium: 136 mEq/L (ref 135–145)

## 2013-06-02 LAB — TSH: TSH: 1.26 u[IU]/mL (ref 0.35–5.50)

## 2013-06-02 MED ORDER — ALPRAZOLAM 0.25 MG PO TABS: 0.2500 mg | ORAL_TABLET | Freq: Two times a day (BID) | ORAL | Status: DC | PRN

## 2013-06-02 NOTE — Progress Notes (Signed)
Pre visit review using our clinic review tool, if applicable. No additional management support is needed unless otherwise documented below in the visit note. 

## 2013-06-02 NOTE — Assessment & Plan Note (Signed)
Ongoing symptoms for 2 weeks following physical therapy manipulation for treatment of neck pain. Neuro exam benign Symptoms consistent with positional vertigo Review discussion with physical therapist regarding uncertainty of diagnosis, but agree with trial vestibular therapy for treatment of symptoms If vertigo unimproved with vestibular therapy, we'll plan referral to neurology Patient understands and agrees

## 2013-06-02 NOTE — Assessment & Plan Note (Signed)
Evaluation by neurosurgery early 2015 unrevealing: "no surgical problem" per patient report Vertell Limber) Ongoing physical therapy treatment for same, ?benefit Patient intolerant of symptomatic medication treatment of same including tramadol and anti-inflammatories Consider Cymbalta, gabapentin or other neuropathic medication if symptoms unimproved -patient wishes to exhaust physical therapy first

## 2013-06-02 NOTE — Assessment & Plan Note (Signed)
Largely situation in past 6 mo with acute medical illness (neck pain, sciatica, atrial flutter s/p RFA), retirement from her career, death of spouse in June 07, 2011 and remarriage with move to new home in June 06, 2012. FH same - sister on sertraline Low-dose Xanax started 04/2013 - using prn again discussed role for SSRI if daily use of benzodiazepine as needed, but no changes recommended at this time

## 2013-06-02 NOTE — Telephone Encounter (Signed)
I called as requested -reviewed pt c/o dizziness to PT - hx c/w BPPV, but exam with modified hall pike not conclusive Asks md to assess and refer if felt appropriate for PT tx of vertigo

## 2013-06-02 NOTE — Progress Notes (Signed)
Subjective:    Patient ID: Elizabeth Mcconnell, female    DOB: 1945/11/19, 68 y.o.   MRN: [id number]  HPI  patient is here today for annual physical. Patient feels well overall - see ROS below  Also reviewed chronic medical issues and interval medical events  Past Medical History  Diagnosis Date  . Tobacco abuse   . Allergic rhinitis   . Oral candidiasis   . Atrial flutter     failed cardioversion 11/2012; s/p ablation 02-02-2013 by Dr Rayann Heman  . Asthma   . Pneumothorax on left 1996    left lower lung collapse s/p resection  . Arthritis   . Osteopenia 07/2011    T score -2.4 FRAX 9.2%/1.2%  . ALLERGIC RHINITIS   . ANXIETY   . Endometriosis    Family History  Problem Relation Age of Onset  . Stroke Father 48  . Hypertension Father   . Heart disease Father   . Diabetes Mother    History  Substance Use Topics  . Smoking status: Former Smoker -- 0.50 packs/day for 20 years    Quit date: 03/04/2004  . Smokeless tobacco: Never Used  . Alcohol Use: 7.0 oz/week    14 drink(s) per week     Comment: social    Review of Systems  Constitutional: Negative for fatigue and unexpected weight change.  Respiratory: Negative for cough, chest tightness, shortness of breath and wheezing.   Cardiovascular: Negative for chest pain, palpitations and leg swelling.  Gastrointestinal: Positive for nausea (w/ vertigo). Negative for abdominal pain and diarrhea.  Musculoskeletal: Positive for gait problem (chronic "sciatica" leg pain) and neck pain (chronic).  Neurological: Positive for dizziness (positional when 1st lying down, last 20-30 sec). Negative for tremors, weakness, light-headedness, numbness and headaches.  Psychiatric/Behavioral: Positive for sleep disturbance (early AM awakening). Negative for self-injury, dysphoric mood and decreased concentration. The patient is nervous/anxious.   All other systems reviewed and are negative.       Objective:   Physical Exam  BP 128/78   Pulse 78  Temp(Src) 97.5 F (36.4 C) (Oral)  Wt 151 lb 12.8 oz (68.856 kg)  SpO2 97% Wt Readings from Last 3 Encounters:  06/02/13 151 lb 12.8 oz (68.856 kg)  04/22/13 149 lb (67.586 kg)  03/29/13 150 lb 12.8 oz (68.402 kg)   Constitutional: She appears well-developed and well-nourished. No distress.  HENT: Head: Normocephalic and atraumatic. Ears: B TMs ok, no erythema or effusion; Nose: Nose normal. Mouth/Throat: Oropharynx is clear and moist. No oropharyngeal exudate.  Eyes: Conjunctivae and EOM are normal. Pupils are equal, round, and reactive to light. No scleral icterus. no nystagmus Neck: Normal range of motion. Neck supple. No JVD present. No thyromegaly present.  Cardiovascular: Normal rate, regular rhythm and normal heart sounds.  No murmur heard. No BLE edema. Pulmonary/Chest: Effort normal and breath sounds normal. No respiratory distress. She has no wheezes.  Abdominal: Soft. Bowel sounds are normal. She exhibits no distension. There is no tenderness. no masses Musculoskeletal: Normal range of motion, no joint effusions. No gross deformities Neurological: She is alert and oriented to person, place, and time. No cranial nerve deficit. Coordination, balance, strength, speech and gait are normal.  Skin: Skin is warm and dry. No rash noted. No erythema.  Psychiatric: She has a mildly anxious mood and affect. Her behavior is normal. Judgment and thought content normal.    Lab Results  Component Value Date   WBC 9.3 01/25/2013   HGB 16.0* 02/22/2013  HCT 47.0* 02/22/2013   PLT 218.0 01/25/2013   GLUCOSE 103* 02/22/2013   ALT 23 10/27/2012   AST 17 10/27/2012   NA 141 02/22/2013   K 3.9 02/22/2013   CL 107 02/22/2013   CREATININE 0.80 02/22/2013   BUN 8 02/22/2013   CO2 25 01/25/2013   TSH 1.21 08/08/2008   INR 1.2* 01/25/2013    Mr Lumbar Spine Wo Contrast  04/10/2013   CLINICAL DATA:  Low back pain and left leg pain.  EXAM: MRI LUMBAR SPINE WITHOUT CONTRAST  TECHNIQUE:  Multiplanar, multisequence MR imaging was performed. No intravenous contrast was administered.  COMPARISON:  Radiography 02/22/2013  FINDINGS: There is mild curvature convex to the right with the apex at L3. There are insignificant disc bulges at L2-3 and L3-4. There is mild facet and ligamentous prominence at L2-3 and L3-4. There is no stenosis or neural compression.  L4-5: Advanced bilateral facet arthropathy with anterolisthesis of 4 mm. Bulging of the disc. Stenosis of both lateral recesses that could cause neural compression. This could worsen with standing or flexion.  L5-S1: Bilateral facet degeneration with 1 mm of anterolisthesis. Annular fissures with annular bulging. No compressive stenosis.  IMPRESSION: Advanced facet arthropathy at L4-5 with anterolisthesis of 4 mm. Bulging of the disc. Stenosis of both lateral recesses that could be symptomatic. This could worsen with standing or flexion.  Facet arthropathy at L5-S1 with 1 mm of anterolisthesis. Annular fissures an annular bulging. No stenosis or neural compression.   Electronically Signed   By: Nelson Chimes M.D.   On: 04/10/2013 11:00       Assessment & Plan:   CPX/v70.0 - Patient has been counseled on age-appropriate routine health concerns for screening and prevention. These are reviewed and up-to-date. Immunizations are up-to-date or declined. Labs ordered and reviewed.  Problem List Items Addressed This Visit   ANXIETY     Largely situation in past 6 mo with acute medical illness (neck pain, sciatica, atrial flutter s/p RFA), retirement from her career, death of spouse in Jul 04, 2011 and remarriage with move to new home in 2012/07/03. FH same - sister on sertraline Low-dose Xanax started 04/2013 - using prn again discussed role for SSRI if daily use of benzodiazepine as needed, but no changes recommended at this time    Relevant Medications      ALPRAZolam Duanne Moron) tablet   BPPV (benign paroxysmal positional vertigo)     Ongoing symptoms for 2  weeks following physical therapy manipulation for treatment of neck pain. Neuro exam benign Symptoms consistent with positional vertigo Review discussion with physical therapist regarding uncertainty of diagnosis, but agree with trial vestibular therapy for treatment of symptoms If vertigo unimproved with vestibular therapy, we'll plan referral to neurology Patient understands and agrees    Relevant Orders      Ambulatory referral to Physical Therapy   Neck pain, chronic     Evaluation by neurosurgery early 2013/07/03 unrevealing: "no surgical problem" per patient report Vertell Limber) Ongoing physical therapy treatment for same, ?benefit Patient intolerant of symptomatic medication treatment of same including tramadol and anti-inflammatories Consider Cymbalta, gabapentin or other neuropathic medication if symptoms unimproved -patient wishes to exhaust physical therapy first     Other Visit Diagnoses   Routine general medical examination at a health care facility    -  Primary    Relevant Orders       Basic metabolic panel       CBC with Differential       Hepatic  function panel       Lipid panel       TSH       Urinalysis, Routine w reflex microscopic    Special screening for malignant neoplasms, colon        Relevant Orders       Ambulatory referral to Gastroenterology

## 2013-06-02 NOTE — Patient Instructions (Addendum)
It was good to see you today.  We have reviewed your prior records including labs and tests today  Health Maintenance reviewed - all recommended immunizations and age-appropriate screenings are up-to-date.  Test(s) ordered today. Your results will be released to [place] (or called to you) after review, usually within 72hours after test completion. If any changes need to be made, you will be notified at that same time.  Medications reviewed and updated, no changes recommended today. Refills provided as discussed  we'll make referral to as a therapy for vertigo treatment. Our office will contact you regarding appointment(s) once made.  Dizziness symptoms unimproved with physical therapy maneuvers, please contact us for referral to neurology as needed  Please schedule followup in 6 months for semiannual exam and review; call sooner if problems. Health Maintenance, Female A healthy lifestyle and preventative care can promote health and wellness.  Maintain regular health, dental, and eye exams.  Eat a healthy diet. Foods like vegetables, fruits, whole grains, low-fat dairy products, and lean protein foods contain the nutrients you need without too many calories. Decrease your intake of foods high in solid fats, added sugars, and salt. Get information about a proper diet from your caregiver, if necessary.  Regular physical exercise is one of the most important things you can do for your health. Most adults should get at least 150 minutes of moderate-intensity exercise (any activity that increases your heart rate and causes you to sweat) each week. In addition, most adults need muscle-strengthening exercises on 2 or more days a week.   Maintain a healthy weight. The body mass index (BMI) is a screening tool to identify possible weight problems. It provides an estimate of body fat based on height and weight. Your caregiver can help determine your BMI, and can help you achieve or maintain a healthy  weight. For adults 20 years and older:  A BMI below 18.5 is considered underweight.  A BMI of 18.5 to 24.9 is normal.  A BMI of 25 to 29.9 is considered overweight.  A BMI of 30 and above is considered obese.  Maintain normal blood lipids and cholesterol by exercising and minimizing your intake of saturated fat. Eat a balanced diet with plenty of fruits and vegetables. Blood tests for lipids and cholesterol should begin at age 34 and be repeated every 5 years. If your lipid or cholesterol levels are high, you are over 50, or you are a high risk for heart disease, you may need your cholesterol levels checked more frequently.Ongoing high lipid and cholesterol levels should be treated with medicines if diet and exercise are not effective.  If you smoke, find out from your caregiver how to quit. If you do not use tobacco, do not start.  Lung cancer screening is recommended for adults aged 23 80 years who are at high risk for developing lung cancer because of a history of smoking. Yearly low-dose computed tomography (CT) is recommended for people who have at least a 30-pack-year history of smoking and are a current smoker or have quit within the past 15 years. A pack year of smoking is smoking an average of 1 pack of cigarettes a day for 1 year (for example: 1 pack a day for 30 years or 2 packs a day for 15 years). Yearly screening should continue until the smoker has stopped smoking for at least 15 years. Yearly screening should also be stopped for people who develop a health problem that would prevent them from having lung cancer treatment.  If you are pregnant, do not drink alcohol. If you are breastfeeding, be very cautious about drinking alcohol. If you are not pregnant and choose to drink alcohol, do not exceed 1 drink per day. One drink is considered to be 12 ounces (355 mL) of beer, 5 ounces (148 mL) of wine, or 1.5 ounces (44 mL) of liquor.  Avoid use of street drugs. Do not share needles with  anyone. Ask for help if you need support or instructions about stopping the use of drugs.  High blood pressure causes heart disease and increases the risk of stroke. Blood pressure should be checked at least every 1 to 2 years. Ongoing high blood pressure should be treated with medicines, if weight loss and exercise are not effective.  If you are 56 to 70 years old, ask your caregiver if you should take aspirin to prevent strokes.  Diabetes screening involves taking a blood sample to check your fasting blood sugar level. This should be done once every 3 years, after age 56, if you are within normal weight and without risk factors for diabetes. Testing should be considered at a younger age or be carried out more frequently if you are overweight and have at least 1 risk factor for diabetes.  Breast cancer screening is essential preventative care for women. You should practice "breast self-awareness." This means understanding the normal appearance and feel of your breasts and may include breast self-examination. Any changes detected, no matter how small, should be reported to a caregiver. Women in their 46s and 30s should have a clinical breast exam (CBE) by a caregiver as part of a regular health exam every 1 to 3 years. After age 92, women should have a CBE every year. Starting at age 47, women should consider having a mammogram (breast X-ray) every year. Women who have a family history of breast cancer should talk to their caregiver about genetic screening. Women at a high risk of breast cancer should talk to their caregiver about having an MRI and a mammogram every year.  Breast cancer gene (BRCA)-related cancer risk assessment is recommended for women who have family members with BRCA-related cancers. BRCA-related cancers include breast, ovarian, tubal, and peritoneal cancers. Having family members with these cancers may be associated with an increased risk for harmful changes (mutations) in the breast  cancer genes BRCA1 and BRCA2. Results of the assessment will determine the need for genetic counseling and BRCA1 and BRCA2 testing.  The Pap test is a screening test for cervical cancer. Women should have a Pap test starting at age 32. Between ages 15 and 53, Pap tests should be repeated every 2 years. Beginning at age 57, you should have a Pap test every 3 years as long as the past 3 Pap tests have been normal. If you had a hysterectomy for a problem that was not cancer or a condition that could lead to cancer, then you no longer need Pap tests. If you are between ages 76 and 70, and you have had normal Pap tests going back 10 years, you no longer need Pap tests. If you have had past treatment for cervical cancer or a condition that could lead to cancer, you need Pap tests and screening for cancer for at least 20 years after your treatment. If Pap tests have been discontinued, risk factors (such as a new sexual partner) need to be reassessed to determine if screening should be resumed. Some women have medical problems that increase the chance of getting cervical  cancer. In these cases, your caregiver may recommend more frequent screening and Pap tests.  The human papillomavirus (HPV) test is an additional test that may be used for cervical cancer screening. The HPV test looks for the virus that can cause the cell changes on the cervix. The cells collected during the Pap test can be tested for HPV. The HPV test could be used to screen women aged 84 years and older, and should be used in women of any age who have unclear Pap test results. After the age of 48, women should have HPV testing at the same frequency as a Pap test.  Colorectal cancer can be detected and often prevented. Most routine colorectal cancer screening begins at the age of 2 and continues through age 19. However, your caregiver may recommend screening at an earlier age if you have risk factors for colon cancer. On a yearly basis, your  caregiver may provide home test kits to check for hidden blood in the stool. Use of a small camera at the end of a tube, to directly examine the colon (sigmoidoscopy or colonoscopy), can detect the earliest forms of colorectal cancer. Talk to your caregiver about this at age 102, when routine screening begins. Direct examination of the colon should be repeated every 5 to 10 years through age 59, unless early forms of pre-cancerous polyps or small growths are found.  Hepatitis C blood testing is recommended for all people born from [birth year] through 1967 and any individual with known risks for hepatitis C.  Practice safe sex. Use condoms and avoid high-risk sexual practices to reduce the spread of sexually transmitted infections (STIs). Sexually active women aged 104 and younger should be checked for Chlamydia, which is a common sexually transmitted infection. Older women with new or multiple partners should also be tested for Chlamydia. Testing for other STIs is recommended if you are sexually active and at increased risk.  Osteoporosis is a disease in which the bones lose minerals and strength with aging. This can result in serious bone fractures. The risk of osteoporosis can be identified using a bone density scan. Women ages 28 and over and women at risk for fractures or osteoporosis should discuss screening with their caregivers. Ask your caregiver whether you should be taking a calcium supplement or vitamin D to reduce the rate of osteoporosis.  Menopause can be associated with physical symptoms and risks. Hormone replacement therapy is available to decrease symptoms and risks. You should talk to your caregiver about whether hormone replacement therapy is right for you.  Use sunscreen. Apply sunscreen liberally and repeatedly throughout the day. You should seek shade when your shadow is shorter than you. Protect yourself by wearing long sleeves, pants, a wide-brimmed hat, and sunglasses year round, whenever  you are outdoors.  Notify your caregiver of new moles or changes in moles, especially if there is a change in shape or color. Also notify your caregiver if a mole is larger than the size of a pencil eraser.  Stay current with your immunizations. Document Released: 09/03/2010 Document Revised: 06/15/2012 Document Reviewed: 09/03/2010 [facility] Patient Information 2014 [place]. Health Maintenance, Female A healthy lifestyle and preventative care can promote health and wellness.  Maintain regular health, dental, and eye exams.  Eat a healthy diet. Foods like vegetables, fruits, whole grains, low-fat dairy products, and lean protein foods contain the nutrients you need without too many calories. Decrease your intake of foods high in solid fats, added sugars, and salt. Get information about  a proper diet from your caregiver, if necessary.  Regular physical exercise is one of the most important things you can do for your health. Most adults should get at least 150 minutes of moderate-intensity exercise (any activity that increases your heart rate and causes you to sweat) each week. In addition, most adults need muscle-strengthening exercises on 2 or more days a week.   Maintain a healthy weight. The body mass index (BMI) is a screening tool to identify possible weight problems. It provides an estimate of body fat based on height and weight. Your caregiver can help determine your BMI, and can help you achieve or maintain a healthy weight. For adults 20 years and older:  A BMI below 18.5 is considered underweight.  A BMI of 18.5 to 24.9 is normal.  A BMI of 25 to 29.9 is considered overweight.  A BMI of 30 and above is considered obese.  Maintain normal blood lipids and cholesterol by exercising and minimizing your intake of saturated fat. Eat a balanced diet with plenty of fruits and vegetables. Blood tests for lipids and cholesterol should begin at age 59 and be repeated every 5 years. If  your lipid or cholesterol levels are high, you are over 50, or you are a high risk for heart disease, you may need your cholesterol levels checked more frequently.Ongoing high lipid and cholesterol levels should be treated with medicines if diet and exercise are not effective.  If you smoke, find out from your caregiver how to quit. If you do not use tobacco, do not start.  Lung cancer screening is recommended for adults aged 43 80 years who are at high risk for developing lung cancer because of a history of smoking. Yearly low-dose computed tomography (CT) is recommended for people who have at least a 30-pack-year history of smoking and are a current smoker or have quit within the past 15 years. A pack year of smoking is smoking an average of 1 pack of cigarettes a day for 1 year (for example: 1 pack a day for 30 years or 2 packs a day for 15 years). Yearly screening should continue until the smoker has stopped smoking for at least 15 years. Yearly screening should also be stopped for people who develop a health problem that would prevent them from having lung cancer treatment.  If you are pregnant, do not drink alcohol. If you are breastfeeding, be very cautious about drinking alcohol. If you are not pregnant and choose to drink alcohol, do not exceed 1 drink per day. One drink is considered to be 12 ounces (355 mL) of beer, 5 ounces (148 mL) of wine, or 1.5 ounces (44 mL) of liquor.  Avoid use of street drugs. Do not share needles with anyone. Ask for help if you need support or instructions about stopping the use of drugs.  High blood pressure causes heart disease and increases the risk of stroke. Blood pressure should be checked at least every 1 to 2 years. Ongoing high blood pressure should be treated with medicines, if weight loss and exercise are not effective.  If you are 49 to 70 years old, ask your caregiver if you should take aspirin to prevent strokes.  Diabetes screening involves taking  a blood sample to check your fasting blood sugar level. This should be done once every 3 years, after age 64, if you are within normal weight and without risk factors for diabetes. Testing should be considered at a younger age or be carried out  more frequently if you are overweight and have at least 1 risk factor for diabetes.  Breast cancer screening is essential preventative care for women. You should practice "breast self-awareness." This means understanding the normal appearance and feel of your breasts and may include breast self-examination. Any changes detected, no matter how small, should be reported to a caregiver. Women in their 43s and 30s should have a clinical breast exam (CBE) by a caregiver as part of a regular health exam every 1 to 3 years. After age 70, women should have a CBE every year. Starting at age 11, women should consider having a mammogram (breast X-ray) every year. Women who have a family history of breast cancer should talk to their caregiver about genetic screening. Women at a high risk of breast cancer should talk to their caregiver about having an MRI and a mammogram every year.  Breast cancer gene (BRCA)-related cancer risk assessment is recommended for women who have family members with BRCA-related cancers. BRCA-related cancers include breast, ovarian, tubal, and peritoneal cancers. Having family members with these cancers may be associated with an increased risk for harmful changes (mutations) in the breast cancer genes BRCA1 and BRCA2. Results of the assessment will determine the need for genetic counseling and BRCA1 and BRCA2 testing.  The Pap test is a screening test for cervical cancer. Women should have a Pap test starting at age 42. Between ages 65 and 60, Pap tests should be repeated every 2 years. Beginning at age 19, you should have a Pap test every 3 years as long as the past 3 Pap tests have been normal. If you had a hysterectomy for a problem that was not cancer  or a condition that could lead to cancer, then you no longer need Pap tests. If you are between ages 57 and 51, and you have had normal Pap tests going back 10 years, you no longer need Pap tests. If you have had past treatment for cervical cancer or a condition that could lead to cancer, you need Pap tests and screening for cancer for at least 20 years after your treatment. If Pap tests have been discontinued, risk factors (such as a new sexual partner) need to be reassessed to determine if screening should be resumed. Some women have medical problems that increase the chance of getting cervical cancer. In these cases, your caregiver may recommend more frequent screening and Pap tests.  The human papillomavirus (HPV) test is an additional test that may be used for cervical cancer screening. The HPV test looks for the virus that can cause the cell changes on the cervix. The cells collected during the Pap test can be tested for HPV. The HPV test could be used to screen women aged 65 years and older, and should be used in women of any age who have unclear Pap test results. After the age of 54, women should have HPV testing at the same frequency as a Pap test.  Colorectal cancer can be detected and often prevented. Most routine colorectal cancer screening begins at the age of 69 and continues through age 66. However, your caregiver may recommend screening at an earlier age if you have risk factors for colon cancer. On a yearly basis, your caregiver may provide home test kits to check for hidden blood in the stool. Use of a small camera at the end of a tube, to directly examine the colon (sigmoidoscopy or colonoscopy), can detect the earliest forms of colorectal cancer. Talk to your caregiver  about this at age 55, when routine screening begins. Direct examination of the colon should be repeated every 5 to 10 years through age 28, unless early forms of pre-cancerous polyps or small growths are found.  Hepatitis C  blood testing is recommended for all people born from [birth year] through 1967 and any individual with known risks for hepatitis C.  Practice safe sex. Use condoms and avoid high-risk sexual practices to reduce the spread of sexually transmitted infections (STIs). Sexually active women aged 44 and younger should be checked for Chlamydia, which is a common sexually transmitted infection. Older women with new or multiple partners should also be tested for Chlamydia. Testing for other STIs is recommended if you are sexually active and at increased risk.  Osteoporosis is a disease in which the bones lose minerals and strength with aging. This can result in serious bone fractures. The risk of osteoporosis can be identified using a bone density scan. Women ages 68 and over and women at risk for fractures or osteoporosis should discuss screening with their caregivers. Ask your caregiver whether you should be taking a calcium supplement or vitamin D to reduce the rate of osteoporosis.  Menopause can be associated with physical symptoms and risks. Hormone replacement therapy is available to decrease symptoms and risks. You should talk to your caregiver about whether hormone replacement therapy is right for you.  Use sunscreen. Apply sunscreen liberally and repeatedly throughout the day. You should seek shade when your shadow is shorter than you. Protect yourself by wearing long sleeves, pants, a wide-brimmed hat, and sunglasses year round, whenever you are outdoors.  Notify your caregiver of new moles or changes in moles, especially if there is a change in shape or color. Also notify your caregiver if a mole is larger than the size of a pencil eraser.  Stay current with your immunizations. Document Released: 09/03/2010 Document Revised: 06/15/2012 Document Reviewed: 09/03/2010 [facility] Patient Information 2014 [place]. Vertigo Vertigo means you feel like you are moving when you are not. Vertigo can make you  feel like things around you are moving when they are not. This problem often goes away on its own.  HOME CARE   Follow your doctor's instructions.  Avoid driving.  Avoid using heavy machinery.  Avoid doing any activity that could be dangerous if you have a vertigo attack.  Tell your doctor if a medicine seems to cause your vertigo. GET HELP RIGHT AWAY IF:   Your medicines do not help or make you feel worse.  You have trouble talking or walking.  You feel weak or have trouble using your arms, hands, or legs.  You have bad headaches.  You keep feeling sick to your stomach (nauseous) or throwing up (vomiting).  Your vision changes.  A family member notices changes in your behavior.  Your problems get worse. MAKE SURE YOU:  Understand these instructions.  Will watch your condition.  Will get help right away if you are not doing well or get worse. Document Released: 11/28/2007 Document Revised: 05/13/2011 Document Reviewed: 09/06/2010 [facility] Patient Information 2014 [place].

## 2013-06-09 ENCOUNTER — Encounter: Payer: Self-pay | Admitting: Internal Medicine

## 2013-06-14 ENCOUNTER — Telehealth: Payer: Self-pay | Admitting: Critical Care Medicine

## 2013-06-14 MED ORDER — AZITHROMYCIN 250 MG PO TABS: 250.0000 mg | ORAL_TABLET | ORAL | Status: DC

## 2013-06-14 MED ORDER — PREDNISONE 10 MG PO TABS: ORAL_TABLET | ORAL | Status: DC

## 2013-06-14 NOTE — Telephone Encounter (Signed)
Spoke with the pt and notified of recs per PW She verbalized understanding  Rxs were sent to [facility]

## 2013-06-14 NOTE — Telephone Encounter (Signed)
Pt has returned call. °

## 2013-06-14 NOTE — Telephone Encounter (Signed)
Last OV 03-29-13, next OV rec in 3 months. I spoke with the pt and she is c/o having sinus congestion, PND, sore throat, productive cough with green phlegm. Wheezing, and some chest tightness x 3 days.  Pt also states last night she had body aches and chills. Pt denies any fever, chest pain, SOB at this time. Pt has taken OTC cold medication without relief. Pt is requesting Rx be called in. Please advise. [place], CMA Allergies  Allergen Reactions  . Tramadol Other (See Comments)    Severe dizziness, weakness  . Amoxicillin-Pot Clavulanate Nausea And Vomiting  . Flecainide     dizziness  . Moxifloxacin Hives, Itching and Rash

## 2013-06-14 NOTE — Telephone Encounter (Signed)
Call in:  Azithromycin 250mg  Take two once then one daily until gone #6 Prednisone 10mg  Take 4 for two days three for two days two for two days one for two days #20

## 2013-06-14 NOTE — Telephone Encounter (Signed)
lmomtcb x1 for pt 

## 2013-06-25 ENCOUNTER — Encounter: Payer: Self-pay | Admitting: Internal Medicine

## 2013-06-28 ENCOUNTER — Encounter: Payer: Self-pay | Admitting: Internal Medicine

## 2013-06-28 ENCOUNTER — Ambulatory Visit (INDEPENDENT_AMBULATORY_CARE_PROVIDER_SITE_OTHER): Payer: Medicare PPO | Admitting: Internal Medicine

## 2013-06-28 ENCOUNTER — Telehealth: Payer: Self-pay | Admitting: Critical Care Medicine

## 2013-06-28 VITALS — BP 120/78 | HR 98 | Ht 65.0 in | Wt 152.2 lb

## 2013-06-28 DIAGNOSIS — J45909 Unspecified asthma, uncomplicated: Secondary | ICD-10-CM

## 2013-06-28 DIAGNOSIS — J309 Allergic rhinitis, unspecified: Secondary | ICD-10-CM

## 2013-06-28 MED ORDER — PHENYLEPHRINE HCL 1 % NA SOLN
3.0000 [drp] | Freq: Once | NASAL | Status: AC
Start: 2013-06-28 — End: 2013-06-28
  Administered 2013-06-28: 3 [drp] via NASAL

## 2013-06-28 MED ORDER — METHYLPREDNISOLONE ACETATE 80 MG/ML IJ SUSP
80.0000 mg | Freq: Once | INTRAMUSCULAR | Status: AC
  Administered 2013-06-28: 80 mg via INTRAMUSCULAR

## 2013-06-28 MED ORDER — DOXYCYCLINE HYCLATE 100 MG PO TABS: 100.0000 mg | ORAL_TABLET | Freq: Two times a day (BID) | ORAL | Status: DC

## 2013-06-28 NOTE — Assessment & Plan Note (Signed)
Current symptoms are more consistent with an upper respiratory infection with grass pollen is heavy and may be playing a role.

## 2013-06-28 NOTE — Progress Notes (Deleted)
Subjective:    Patient ID: Elizabeth Mcconnell, female    DOB: November 17, 1945, 68 y.o.   MRN: [id number]  HPI  68 yo WF with known hx of  Severe atopic asthma with extremely high IG levels, paroxysmal atrial fibrillation felt to be secondary to albuterol, allergic rhinitis,  Former smoker .  Prior history of recurrent spontaneous pneumothorax, with left lower lobe lobectomy.  03/29/2013 Chief Complaint  Patient presents with  . 4 month follow up    Feels breathing is at baseline but coughing x 3-4 wks.  No wheezing, chest tightness/pain, or fever.  Pt notes ongoing cough, ?caught virus around holidays.  Pt had doxy 5 day supply, took med on its own.  Doxy seemed to help.  Pt with sciatic nerve issues in L leg.  Cough was productive, looked green.  Now less prod cough, not as much. Notes some pndrip, will sneeze. No chest pain. No wheeze. Pt denies any significant sore throat, nasal congestion or excess secretions, fever, chills, sweats, unintended weight loss, pleurtic or exertional chest pain, orthopnea PND, or leg swelling Pt denies any increase in rescue therapy over baseline, denies waking up needing it or having any early am or nocturnal exacerbations of coughing/wheezing/or dyspnea. Pt also denies any obvious fluctuation in symptoms with  weather or environmental change or other alleviating or aggravating factors    Review of Systems  Constitutional:   No  weight loss, night sweats,  Fevers, chills, fatigue, or  lassitude.  HEENT:   No headaches,  Difficulty swallowing,  Tooth/dental problems, or  Sore throat,                No sneezing, itching, ear ache, nasal congestion, post nasal drip,   CV:  No chest pain,  Orthopnea, PND, swelling in lower extremities, anasarca, dizziness, palpitations, syncope.   GI  No heartburn, indigestion, abdominal pain, nausea, vomiting, diarrhea, change in bowel habits, loss of appetite, bloody stools.   Resp:   No chest wall deformity  Skin: no  rash or lesions.  GU: no dysuria, change in color of urine, no urgency or frequency.  No flank pain, no hematuria   MS:  No joint pain or swelling.  No decreased range of motion.  No back pain.  Psych:  No change in mood or affect. No depression or anxiety.  No memory loss.     Objective:   Physical Exam BP 146/90  Pulse 94  Temp(Src) 98.6 F (37 C) (Oral)  Ht 5\' 5"  (1.651 m)  Wt 150 lb 12.8 oz (68.402 kg)  BMI 25.09 kg/m2  SpO2 97%  GEN: A/Ox3; pleasant , NAD, well nourished   HEENT:  [place]/AT,  EACs-clear, TMs-wnl, NOSE-clear, THROAT-clear, no lesions, no postnasal drip or exudate noted.   NECK:  Supple w/ fair ROM; no JVD; normal carotid impulses w/o bruits; no thyromegaly or nodules palpated; no lymphadenopathy.  RESP clearer , no accessory muscle use, no dullness to percussion  CARD:  RRR, no m/r/g  , no peripheral edema, pulses intact, no cyanosis or clubbing.  GI:   Soft & nt; nml bowel sounds; no organomegaly or masses detected.  Musco: Warm bil, no deformities or joint swelling noted.   Neuro: alert, no focal deficits noted.    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 acetaminophen (TYLENOL) 500 MG tablet Take 500 mg by mouth every 6 (six) hours as needed for pain.  . Calcium Carbonate-Vitamin D (CALCIUM-VITAMIN D) 500-200 MG-UNIT per tablet Take 1 tablet by mouth daily.    . Cholecalciferol (VITAMIN D-3) 5000 UNITS TABS Take 5,000 Units by mouth every other day.  . estradiol (VIVELLE-DOT) 0.075 MG/24HR Place 1 patch onto the skin 2 (two) times a week.  . fluticasone (FLONASE) 50 MCG/ACT nasal spray Place 1 spray into both nostrils 2 (two) times daily.   . Fluticasone-Salmeterol (ADVAIR DISKUS) 250-50 MCG/DOSE AEPB Inhale 1 puff into the lungs every 12 (twelve) hours.  [name] levalbuterol (XOPENEX HFA) 45 MCG/ACT inhaler Inhale 1-2 puffs into the lungs every 4 (four) hours as needed for shortness of breath.  . montelukast (SINGULAIR) 10 MG tablet Take 10 mg by mouth every morning.  [name] OVER THE COUNTER MEDICATION Take 1 tablet by mouth daily as needed (sleep). Herbal medication (melatonin, lemon Mcconnell, camomile, lavender.)  . oxyCODONE-acetaminophen (PERCOCET/ROXICET) 5-325 MG per tablet 1 to 2 tablets every 6 hours as needed for pain. (Pt is taking 1/2 tablet at night to help her with pain/sleep)  . [DISCONTINUED] QVAR 80 MCG/ACT inhaler 1 puff as needed.  . doxycycline (DORYX) 100 MG EC tablet Take 1 tablet (100 mg total) by mouth 2 (two) times daily.

## 2013-06-28 NOTE — Telephone Encounter (Signed)
Spoke with patient-I have placed her on CY schedule at 1:45pm today.

## 2013-06-28 NOTE — Assessment & Plan Note (Addendum)
Acute asthmatic bronchitis, initially viral or allergic. Medication talk. Rhinitis and possibly developing sinusitis. Plan- neb nasal decongestant, depomedrol, doxycycline

## 2013-06-28 NOTE — Telephone Encounter (Signed)
Pt states that she recently had flare up of ? Allergies and was given Zpak and Prednisone from [place] on 06/14/13; did not take the Prednisone as she felt that she did not need it. Cleared up from that and then her husband got sick with sore throat and cold; pt states she has now caught the same cold from her husband; she is having sore throat, cough-slight productive-yellow in color, slight wheeze coming on,sinus drainage, sluggish feeling, stayed chilled yesterday and tried OTC cough DM syrup-little bit of relief. This has been going on since Saturday afternoon. Will forward to VS as doc of day and PW not available.   Allergies  Allergen Reactions  . Tramadol Other (See Comments)    Severe dizziness, weakness  . Amoxicillin-Pot Clavulanate Nausea And Vomiting  . Flecainide     dizziness  . Moxifloxacin Hives, Itching and Rash   VS please advise. Thanks.

## 2013-06-28 NOTE — Telephone Encounter (Signed)
This sounds like new problem different from her allergies earlier this month.  She will need to come to office for evaluation.

## 2013-06-28 NOTE — Patient Instructions (Addendum)
Script sent for doxycycline  Neb neo nasal  Depo 80  Follow up with Dr Joya Gaskins as planned

## 2013-06-28 NOTE — Progress Notes (Signed)
68 yo WF with known hx of  Severe atopic asthma with extremely high IG levels, paroxysmal atrial fibrillation felt to be secondary to albuterol, allergic rhinitis,  Former smoker .  Prior history of recurrent spontaneous pneumothorax, with left lower lobe lobectomy.  06/29/13- Acute Visit- 73 yoF former smoker followed by Dr Joya Gaskins ACUTE VISIT:  Sinus pressure, PND and cough with yellow mucus x2 days Was outdoors over a cold rainy weekend. Now describes 3 days of nasal congestion, frontal headache, yellow drainage, scratchy throat. No fever. Mild cough without wheeze. Worst allergy season tends to be in the fall.  ROS-see HPI Constitutional:   No-   weight loss, night sweats, fevers, chills, fatigue, lassitude. HEENT:   + headaches, difficulty swallowing, tooth/dental problems, +sore throat,       No-  sneezing, itching, ear ache, +nasal congestion, post nasal drip,  CV:  No-   chest pain, orthopnea, PND, swelling in lower extremities, anasarca,                                                     dizziness, palpitations Resp: No-   shortness of breath with exertion or at rest.              + productive cough,  No non-productive cough,  No- coughing up of blood.              No-   change in color of mucus.  No- wheezing.   Skin: No-   rash or lesions. GI:  No-   heartburn, indigestion, abdominal pain, nausea, vomiting,  GU:  MS:  No-   joint pain or swelling. Neuro-     nothing unusual Psych:  No- change in mood or affect. No depression or anxiety.  No memory loss.  OBJ- Physical Exam General- Alert, Oriented, Affect-appropriate, Distress- none acute Skin- rash-none, lesions- none, excoriation- none Lymphadenopathy- none Head- atraumatic            Eyes- Gross vision intact, PERRLA, conjunctivae and secretions clear            Ears- Hearing, canals-normal            Nose- Clear, no-Septal dev, mucus, polyps, erosion, perforation             Throat- Mallampati II , mucosa clear ,  drainage+mucoid, tonsils- atrophic Neck- flexible , trachea midline, no stridor , thyroid nl, carotid no bruit Chest - symmetrical excursion , unlabored           Heart/CV- RRR , no murmur , no gallop  , no rub, nl s1 s2                           - JVD- none , edema- none, stasis changes- none, varices- none           Lung- clear to P&A, wheeze- none, cough+Raspy , dullness-none, rub- none           Chest wall-  Abd-  Br/ Gen/ Rectal- Not done, not indicated Extrem- cyanosis- none, clubbing, none, atrophy- none, strength- nl Neuro- grossly intact to observation

## 2013-06-30 ENCOUNTER — Ambulatory Visit (INDEPENDENT_AMBULATORY_CARE_PROVIDER_SITE_OTHER): Payer: Medicare PPO | Admitting: Critical Care Medicine

## 2013-06-30 ENCOUNTER — Ambulatory Visit (INDEPENDENT_AMBULATORY_CARE_PROVIDER_SITE_OTHER)
Admission: RE | Admit: 2013-06-30 | Discharge: 2013-06-30 | Disposition: A | Discharge status: Home / Self Care | Payer: Medicare PPO | Source: Ambulatory Visit | Attending: Critical Care Medicine | Admitting: Critical Care Medicine

## 2013-06-30 ENCOUNTER — Encounter: Payer: Self-pay | Admitting: Critical Care Medicine

## 2013-06-30 ENCOUNTER — Telehealth: Payer: Self-pay | Admitting: Critical Care Medicine

## 2013-06-30 VITALS — BP 140/82 | HR 95 | Temp 98.0°F | Ht 65.0 in | Wt 149.6 lb

## 2013-06-30 DIAGNOSIS — J45909 Unspecified asthma, uncomplicated: Secondary | ICD-10-CM

## 2013-06-30 DIAGNOSIS — J209 Acute bronchitis, unspecified: Secondary | ICD-10-CM

## 2013-06-30 MED ORDER — FLUTICASONE-SALMETEROL 250-50 MCG/DOSE IN AEPB: INHALATION_SPRAY | RESPIRATORY_TRACT | Status: DC

## 2013-06-30 MED ORDER — HYDROCODONE-HOMATROPINE 5-1.5 MG/5ML PO SYRP: 5.0000 mL | ORAL_SOLUTION | Freq: Four times a day (QID) | ORAL | Status: DC | PRN

## 2013-06-30 MED ORDER — MOMETASONE FURO-FORMOTEROL FUM 200-5 MCG/ACT IN AERO: 2.0000 | INHALATION_SPRAY | Freq: Two times a day (BID) | RESPIRATORY_TRACT | Status: DC

## 2013-06-30 MED ORDER — PREDNISONE 10 MG PO TABS: ORAL_TABLET | ORAL | Status: DC

## 2013-06-30 MED ORDER — AZITHROMYCIN 250 MG PO TABS: 250.0000 mg | ORAL_TABLET | Freq: Every day | ORAL | Status: DC

## 2013-06-30 MED ORDER — LEVALBUTEROL HCL 0.63 MG/3ML IN NEBU
0.6300 mg | INHALATION_SOLUTION | Freq: Once | RESPIRATORY_TRACT | Status: AC
Start: 2013-06-30 — End: 2013-06-30
  Administered 2013-06-30: 0.63 mg via RESPIRATORY_TRACT

## 2013-06-30 MED ORDER — FLUTICASONE PROPIONATE 50 MCG/ACT NA SUSP: 2.0000 | Freq: Two times a day (BID) | NASAL | Status: DC

## 2013-06-30 NOTE — Patient Instructions (Addendum)
Azithromycin 250mg  Take two once then one daily until gone Prednisone 10mg  Take 4 for three days 3 for three days 2 for three days 1 for three days and stop Stop vibramycin (doxycycline) Increase flonase to two puff twice daily ea nostril HOLD advair Use Dulera 200 two puff twice daily Use hycodan as needed for cough A xopenex 0.63mg  neb was given in the office Return 2 weeks recheck

## 2013-06-30 NOTE — Progress Notes (Signed)
Subjective:    Patient ID: Elizabeth Mcconnell, female    DOB: 1946-02-11, 68 y.o.   MRN: [id number]  HPI 68 y.o.  WF with known hx of  Severe atopic asthma with extremely high IG levels, paroxysmal atrial fibrillation felt to be secondary to albuterol, allergic rhinitis,  Former smoker .  Prior history of recurrent spontaneous pneumothorax, with left lower lobe lobectomy.  06/30/2013 Chief Complaint  Patient presents with  . Acute Visit    Was seen by CY on Monday.  Feels worse since this.  Has increased cough with small amount of yellowish green mucus, SOB, shakiness/dizzy, chills, wheezing, and some chest tightness.  Has used xopenex hfa with a little relief.    Pt just seen and no better. Pt using HFA xopenex continuous.  Cough is constant and ribs hurt.  SInuses congested.  Pt notes more dyspnea in the PM.   Notes chills, no fever.   Pt called in [place] [place] 3 weeks ago, did not take Pred as Rx  Review of Systems Constitutional:   No  weight loss, night sweats,  Fevers, chills, fatigue, lassitude. HEENT:   No headaches,  Difficulty swallowing,  Tooth/dental problems,  Sore throat,                No sneezing, itching, ear ache, nasal congestion, post nasal drip,   CV:  No chest pain,  Orthopnea, PND, swelling in lower extremities, anasarca, dizziness, palpitations  GI  No heartburn, indigestion, abdominal pain, nausea, vomiting, diarrhea, change in bowel habits, loss of appetite  Resp:  shortness of breath with exertion and at rest.  +++excess mucus, +++  productive cough,  +++non-productive cough,  No coughing up of blood.  No change in color of mucus.  No wheezing.  No chest wall deformity  Skin: no rash or lesions.  GU: no dysuria, change in color of urine, no urgency or frequency.  No flank pain.  MS:  No joint pain or swelling.  No decreased range of motion.  No back pain.  Psych:  No change in mood or affect. No depression or anxiety.  No memory loss.     Objective:   Physical Exam Filed Vitals:   06/30/13 1028  BP: 140/82  Pulse: 95  Temp: 98 F (36.7 C)  TempSrc: Oral  Height: 5\' 5"  (1.651 m)  Weight: 149 lb 9.6 oz (67.858 kg)  SpO2: 94%    Gen: Pleasant, well-nourished, in no distress,  normal affect  ENT: No lesions,  mouth clear,  oropharynx clear, no postnasal drip  Neck: No JVD, no TMG, no carotid bruits  Lungs: No use of accessory muscles, no dullness to percussion, exp wheezes, poor airflow  Cardiovascular: RRR, heart sounds normal, no murmur or gallops, no peripheral edema  Abdomen: soft and NT, no HSM,  BS normal  Musculoskeletal: No deformities, no cyanosis or clubbing  Neuro: alert, non focal  Skin: Warm, no lesions or rashes  Dg Chest 2 View  06/30/2013   CLINICAL DATA:  acute bronchitis  EXAM: CHEST  2 VIEW  COMPARISON:  None.  FINDINGS: The lungs are hyperinflated. There is flattening of the hemidiaphragms. Chronic changes are appreciated within the left lung apex and left hilar region. Mild increased density is identified within the left lung base with linear components. No further regions of consolidation or focal infiltrates. No acute osseus abnormalities. Levoscoliosis within the thoracic spine.  IMPRESSION: Chronic changes in the left hemi thorax.  Area of increased density left  lung base differential considerations atelectasis versus scarring versus an infiltrate.  COPD   Electronically Signed   By: Margaree Mackintosh M.D.   On: 06/30/2013 13:11      Assessment & Plan:   Moderate persistent asthma with significant atopic features Asthmatic bronchitis with flare , no sinusitis. Plan Azithromycin 250mg  Take two once then one daily until gone Prednisone 10mg  Take 4 for three days 3 for three days 2 for three days 1 for three days and stop Stop vibramycin (doxycycline) Increase flonase to two puff twice daily ea nostril HOLD advair Use Dulera 200 two puff twice daily Use hycodan as needed for cough A xopenex 0.63mg  neb  was given in the office Return 2 weeks recheck    Updated Medication List Outpatient Encounter Prescriptions as of 06/30/2013  Medication Sig  . acetaminophen (TYLENOL) 500 MG tablet Take 500 mg by mouth every 6 (six) hours as needed for pain.  [name] ALPRAZolam (XANAX) 0.25 MG tablet Take 1 tablet (0.25 mg total) by mouth 2 (two) times daily as needed for anxiety or sleep.  . Calcium Carbonate-Vitamin D (CALCIUM-VITAMIN D) 500-200 MG-UNIT per tablet Take 1 tablet by mouth daily.    . Cholecalciferol (VITAMIN D-3) 5000 UNITS TABS Take 5,000 Units by mouth every other day.  [name] dextromethorphan (COUGH DM) 30 MG/5ML liquid Take 30 mg by mouth as needed for cough.  . estradiol (VIVELLE-DOT) 0.075 MG/24HR Place 1 patch onto the skin 2 (two) times a week.  . fluticasone (FLONASE) 50 MCG/ACT nasal spray Place 2 sprays into both nostrils 2 (two) times daily.  . Fluticasone-Salmeterol (ADVAIR DISKUS) 250-50 MCG/DOSE AEPB HOLD FOR NOW  . levalbuterol (XOPENEX HFA) 45 MCG/ACT inhaler Inhale 1-2 puffs into the lungs every 4 (four) hours as needed for shortness of breath.  . Melatonin 3 MG TABS Take 1 tablet by mouth at bedtime.   . montelukast (SINGULAIR) 10 MG tablet Take 10 mg by mouth every morning.  . [DISCONTINUED] doxycycline (VIBRA-TABS) 100 MG tablet Take 1 tablet (100 mg total) by mouth 2 (two) times daily.  . [DISCONTINUED] fluticasone (FLONASE) 50 MCG/ACT nasal spray Place 1 spray into both nostrils 2 (two) times daily.  . [DISCONTINUED] Fluticasone-Salmeterol (ADVAIR DISKUS) 250-50 MCG/DOSE AEPB Inhale 1 puff into the lungs every 12 (twelve) hours.  [name] azithromycin (ZITHROMAX) 250 MG tablet Take 1 tablet (250 mg total) by mouth daily. Take two once then one daily until gone  . HYDROcodone-homatropine (HYCODAN) 5-1.5 MG/5ML syrup Take 5 mLs by mouth every 6 (six) hours as needed for cough.  . mometasone-formoterol (DULERA) 200-5 MCG/ACT AERO Inhale 2 puffs into the lungs 2 (two) times daily.  .  predniSONE (DELTASONE) 10 MG tablet Take 4 for three days 3 for three days 2 for three days 1 for three days and stop  . [EXPIRED] levalbuterol (XOPENEX) nebulizer solution 0.63 mg

## 2013-07-01 NOTE — Telephone Encounter (Signed)
Per Daleen Snook, this was opened in error  Ok to close

## 2013-07-01 NOTE — Assessment & Plan Note (Signed)
Asthmatic bronchitis with flare , no sinusitis. Plan Azithromycin 250mg  Take two once then one daily until gone Prednisone 10mg  Take 4 for three days 3 for three days 2 for three days 1 for three days and stop Stop vibramycin (doxycycline) Increase flonase to two puff twice daily ea nostril HOLD advair Use Dulera 200 two puff twice daily Use hycodan as needed for cough A xopenex 0.63mg  neb was given in the office Return 2 weeks recheck

## 2013-07-05 ENCOUNTER — Telehealth: Payer: Self-pay | Admitting: Critical Care Medicine

## 2013-07-05 NOTE — Telephone Encounter (Signed)
Called to check in on pt She is much improved

## 2013-07-14 ENCOUNTER — Ambulatory Visit: Payer: Medicare PPO | Admitting: Adult Health

## 2013-07-15 ENCOUNTER — Encounter (INDEPENDENT_AMBULATORY_CARE_PROVIDER_SITE_OTHER): Payer: Self-pay

## 2013-07-15 ENCOUNTER — Encounter: Payer: Self-pay | Admitting: Adult Health

## 2013-07-15 ENCOUNTER — Ambulatory Visit (INDEPENDENT_AMBULATORY_CARE_PROVIDER_SITE_OTHER): Payer: Medicare PPO | Admitting: Adult Health

## 2013-07-15 VITALS — BP 108/66 | HR 71 | Temp 98.2°F | Ht 65.0 in | Wt 152.8 lb

## 2013-07-15 DIAGNOSIS — J45909 Unspecified asthma, uncomplicated: Secondary | ICD-10-CM

## 2013-07-15 MED ORDER — MOMETASONE FURO-FORMOTEROL FUM 200-5 MCG/ACT IN AERO: 2.0000 | INHALATION_SPRAY | Freq: Two times a day (BID) | RESPIRATORY_TRACT | Status: DC

## 2013-07-15 MED ORDER — CLOTRIMAZOLE 10 MG MT TROC: 10.0000 mg | Freq: Every day | OROMUCOSAL | Status: DC

## 2013-07-15 NOTE — Patient Instructions (Signed)
Mycelex troche five times daily for 1 week.  Continue on Dulera 2 puffs Twice daily  , rinse after use.  Mucinex DM Twice daily  As needed  For cough/congestion .  follow up Dr. Joya Gaskins  In 6 weeks and As needed   [facility] you have a nice trip.

## 2013-07-16 ENCOUNTER — Other Ambulatory Visit: Payer: Self-pay | Admitting: Critical Care Medicine

## 2013-07-19 NOTE — Progress Notes (Signed)
Subjective:    Patient ID: Elizabeth Mcconnell, female    DOB: January 29, 1946, 68 y.o.   MRN: [id number]  HPI 73 WF with known hx of  Severe atopic asthma with extremely high IG levels, paroxysmal atrial fibrillation felt to be secondary to albuterol, allergic rhinitis,  Former smoker .  Prior history of recurrent spontaneous pneumothorax, with left lower lobe lobectomy.  06/30/2013 Chief Complaint  Patient presents with  . Acute Visit    Was seen by CY on Monday.  Feels worse since this.  Has increased cough with small amount of yellowish green mucus, SOB, shakiness/dizzy, chills, wheezing, and some chest tightness.  Has used xopenex hfa with a little relief.    Pt just seen and no better. Pt using HFA xopenex continuous.  Cough is constant and ribs hurt.  SInuses congested.  Pt notes more dyspnea in the PM.   Notes chills, no fever.   Pt called in [place] 3 weeks ago, did not take Pred as Rx >>Azithromycin 250mg  Take two once then one daily until gone Prednisone 10mg  Take 4 for three days 3 for three days 2 for three days 1 for three days and stop Stop vibramycin (doxycycline) Increase flonase to two puff twice daily ea nostril HOLD advair Use Dulera 200 two puff twice daily Use hycodan as needed for cough A xopenex 0.63mg  neb was given in the office   07/15/13 Follow up  Returns for 2 week follow up.  Reports cough/congestion is 90% improved, but still producing light yellow/green mucus in the AM. Mucus is clearing. No fever , chest pain, orthopnea, or edema.  Was seen 2 weeks ago for slow to resolve AB flare , tx w/ zpack and pred taper. Changed from advair to [place] .  Finished Dulera samples yesterday and resumed Advair this morning. Leaving on vacation .  Review of Systems Constitutional:   No  weight loss, night sweats,  Fevers, chills, fatigue, or  lassitude.  HEENT:   No headaches,  Difficulty swallowing,  Tooth/dental problems, or  Sore throat,                No sneezing,  itching, ear ache,  +nasal congestion, post nasal drip,   CV:  No chest pain,  Orthopnea, PND, swelling in lower extremities, anasarca, dizziness, palpitations, syncope.   GI  No heartburn, indigestion, abdominal pain, nausea, vomiting, diarrhea, change in bowel habits, loss of appetite, bloody stools.   Resp:   No coughing up of blood.  No change in color of mucus.  No wheezing.  No chest wall deformity  Skin: no rash or lesions.  GU: no dysuria, change in color of urine, no urgency or frequency.  No flank pain, no hematuria   MS:  No joint pain or swelling.  No decreased range of motion.  No back pain.  Psych:  No change in mood or affect. No depression or anxiety.  No memory loss.         Objective:   Physical Exam  GEN: A/Ox3; pleasant , NAD, well nourished   HEENT:  /AT,  EACs-clear, TMs-wnl, NOSE-clear, THROAT-clear, no lesions, no postnasal drip  Few scattered white patches in post pharynx- c/w thrush  NECK:  Supple w/ fair ROM; no JVD; normal carotid impulses w/o bruits; no thyromegaly or nodules palpated; no lymphadenopathy.  RESP  Clear  P & A; w/o, wheezes/ rales/ or rhonchi.no accessory muscle use, no dullness to percussion  CARD:  RRR, no m/r/g  ,  no peripheral edema, pulses intact, no cyanosis or clubbing.  GI:   Soft & nt; nml bowel sounds; no organomegaly or masses detected.  Musco: Warm bil, no deformities or joint swelling noted.   Neuro: alert, no focal deficits noted.    Skin: Warm, no lesions or rashes        Assessment & Plan:

## 2013-07-19 NOTE — Assessment & Plan Note (Signed)
Recent flare now resolving  Mild oral candididasis   Plan  Mycelex troche five times daily for 1 week.  Continue on Dulera 2 puffs Twice daily  , rinse after use.  Mucinex DM Twice daily  As needed  For cough/congestion .  follow up Dr. Joya Gaskins  In 6 weeks and As needed   [facility] you have a nice trip.

## 2013-07-30 ENCOUNTER — Encounter: Payer: Self-pay | Admitting: Gynecology

## 2013-07-30 ENCOUNTER — Ambulatory Visit (INDEPENDENT_AMBULATORY_CARE_PROVIDER_SITE_OTHER): Payer: Medicare PPO | Admitting: Gynecology

## 2013-07-30 VITALS — BP 122/80 | Ht 65.0 in | Wt 149.0 lb

## 2013-07-30 DIAGNOSIS — M899 Disorder of bone, unspecified: Secondary | ICD-10-CM

## 2013-07-30 DIAGNOSIS — M949 Disorder of cartilage, unspecified: Secondary | ICD-10-CM

## 2013-07-30 DIAGNOSIS — M8580 Other specified disorders of bone density and structure, unspecified site: Secondary | ICD-10-CM

## 2013-07-30 DIAGNOSIS — N952 Postmenopausal atrophic vaginitis: Secondary | ICD-10-CM

## 2013-07-30 DIAGNOSIS — Z79890 Hormone replacement therapy (postmenopausal): Secondary | ICD-10-CM

## 2013-07-30 DIAGNOSIS — N898 Other specified noninflammatory disorders of vagina: Secondary | ICD-10-CM

## 2013-07-30 MED ORDER — ESTRADIOL 0.075 MG/24HR TD PTTW: 1.0000 | MEDICATED_PATCH | TRANSDERMAL | Status: DC

## 2013-07-30 NOTE — Progress Notes (Signed)
Elizabeth Mcconnell 1945-12-24 [id number]        68 y.o.  [id number] for followup exam. Several issues noted below.  Past medical history,surgical history, problem list, medications, allergies, family history and social history were all reviewed and documented as reviewed in the EPIC chart.  ROS:  12 system ROS performed with pertinent positives and negatives included in the history, assessment and plan.  Included Systems: General, HEENT, Neck, Cardiovascular, Pulmonary, Gastrointestinal, Genitourinary, Musculoskeletal, Dermatologic, Endocrine, Hematological, Neurologic, Psychiatric Additional significant findings :  None   Exam: Kim assistant Filed Vitals:   07/30/13 1129  BP: 122/80  Height: 5\' 5"  (1.651 m)  Weight: 149 lb (67.586 kg)   General appearance:  Normal affect, orientation and appearance. Skin: Grossly normal HEENT: Without gross lesions.  No cervical or supraclavicular adenopathy. Thyroid normal.  Lungs:  Clear without wheezing, rales or rhonchi Cardiac: RR, without RMG Abdominal:  Soft, nontender, without masses, guarding, rebound, organomegaly or hernia Breasts:  Examined lying and sitting without masses, retractions, discharge or axillary adenopathy. Pelvic:  Ext/BUS/vagina with generalized atrophic changes. 2 cm cyst, submucosal introital opening posterior fourchette   Adnexa  Without masses or tenderness    Anus and perineum  Normal   Rectovaginal  Normal sphincter tone without palpated masses or tenderness.    Assessment/Plan:  68 y.o. [name] female for followup exam.   1. Postmenopausal/atrophic genital changes/HRT. Patient continues on Vivelle 0.075 patch doing well.  I again reviewed the whole issue of HRT with her to include the WHI study with increased risk of stroke, heart attack, DVT and breast cancer. The ACOG and NAMS statements for lowest dose for the shortest period of time reviewed. Transdermal versus oral first-pass effect benefit discussed.  Issues of  continuing into the late 60s and 70s with increased risk of stroke also discussed. Patient feels very strongly that she wants to continue at this time having tried stopping before and having unacceptable hot flushes. I refilled her x1 year. 2. Vaginal cyst. Present for years unchanged. Is not an issue to the patient. Continue to monitor. 3. Osteopenia. DEXA 07/2011 T score -2.4 FRAX 9.2%/1.2%. Repeat DEXA now and patient will schedule. Increase calcium vitamin D reviewed. 4. Colonoscopy 10 years ago. Patient is going to call [facility] gastroenterology to arrange. 5. Pap smear 2011. No Pap smear done today. No history of abnormal Pap smears. Status post hysterectomy for benign indications. Over the age of 88. We both agreed to stop screening per current screening guidelines. 6. Maintenance. No blood work done as she reports this all done through her primary physician's office. Followup one year, sooner as needed.   Note: This document was prepared with digital dictation and possible smart phrase technology. Any transcriptional errors that result from this process are unintentional.   [name] MD, 11:57 AM 07/30/2013

## 2013-07-30 NOTE — Patient Instructions (Signed)
Followup for a bone density scheduled. Call to arrange for your colonoscopy. Followup in one year for annual exam.

## 2013-07-31 LAB — URINALYSIS W MICROSCOPIC + REFLEX CULTURE
Bacteria, UA: NONE SEEN
Bilirubin Urine: NEGATIVE
Casts: NONE SEEN
Crystals: NONE SEEN
Glucose, UA: NEGATIVE mg/dL
Hgb urine dipstick: NEGATIVE
Ketones, ur: NEGATIVE mg/dL
Leukocytes, UA: NEGATIVE
Nitrite: NEGATIVE
Protein, ur: NEGATIVE mg/dL
Specific Gravity, Urine: 1.007 (ref 1.005–1.030)
Squamous Epithelial / LPF: NONE SEEN
Urobilinogen, UA: 0.2 mg/dL (ref 0.0–1.0)
pH: 6 (ref 5.0–8.0)

## 2013-08-03 ENCOUNTER — Other Ambulatory Visit: Payer: Self-pay | Admitting: Critical Care Medicine

## 2013-08-16 ENCOUNTER — Encounter: Payer: Self-pay | Admitting: Obstetrics and Gynecology

## 2013-08-16 ENCOUNTER — Encounter: Payer: Self-pay | Admitting: Internal Medicine

## 2013-08-17 ENCOUNTER — Telehealth: Payer: Self-pay | Admitting: Gynecology

## 2013-08-17 ENCOUNTER — Ambulatory Visit (INDEPENDENT_AMBULATORY_CARE_PROVIDER_SITE_OTHER): Payer: Medicare PPO

## 2013-08-17 ENCOUNTER — Encounter: Payer: Self-pay | Admitting: Gynecology

## 2013-08-17 DIAGNOSIS — M8580 Other specified disorders of bone density and structure, unspecified site: Secondary | ICD-10-CM

## 2013-08-17 DIAGNOSIS — M899 Disorder of bone, unspecified: Secondary | ICD-10-CM

## 2013-08-17 DIAGNOSIS — M949 Disorder of cartilage, unspecified: Secondary | ICD-10-CM

## 2013-08-17 NOTE — Telephone Encounter (Signed)
Tell patient bone density is stable from prior DEXA. She is at the borderline for fracture risk and treatment such as Fosamax. I would recommend observation for now with repeat bone density at 2 years. Would be glad to discuss treatment options if she would be interested with office visit.

## 2013-08-18 NOTE — Telephone Encounter (Signed)
Pt informed with the below note. 

## 2013-09-01 ENCOUNTER — Ambulatory Visit (INDEPENDENT_AMBULATORY_CARE_PROVIDER_SITE_OTHER): Payer: Medicare PPO | Admitting: Critical Care Medicine

## 2013-09-01 ENCOUNTER — Encounter: Payer: Self-pay | Admitting: Critical Care Medicine

## 2013-09-01 VITALS — BP 142/78 | HR 80 | Ht 65.0 in | Wt 152.0 lb

## 2013-09-01 DIAGNOSIS — J45909 Unspecified asthma, uncomplicated: Secondary | ICD-10-CM

## 2013-09-01 MED ORDER — PREDNISONE 10 MG PO TABS
ORAL_TABLET | ORAL | Status: DC
Start: 2013-09-01 — End: 2013-10-04

## 2013-09-01 MED ORDER — AZITHROMYCIN 250 MG PO TABS
ORAL_TABLET | ORAL | Status: DC
Start: 2013-09-01 — End: 2013-10-04

## 2013-09-01 NOTE — Patient Instructions (Signed)
Return 2 months No change in medications Call us and let us know if you had to use antibiotic and prednisone on your trip

## 2013-09-01 NOTE — Assessment & Plan Note (Signed)
Moderate persistent asthma with significant atopic features and recent exacerbations in the spring now improved Plan Maintain Dulera Return 2 months Note the patient is about to go on a 2 week cruise to [place] and I gave the patient prescriptions for prednisone and azithromycin take on the trip. She is to call or return to tell us if these were taken or not

## 2013-09-01 NOTE — Progress Notes (Signed)
Subjective:    Patient ID: Elizabeth Mcconnell, female    DOB: [birth year], 68 y.o.   MRN: [id number]  HPI 68 y.o.F  with known hx of  Severe atopic asthma with extremely high IgE  levels, paroxysmal atrial fibrillation felt to be secondary to albuterol, allergic rhinitis,  Former smoker .  Prior history of recurrent spontaneous pneumothorax, with left lower lobe lobectomy.  09/01/2013 Chief Complaint  Patient presents with  . Follow-up    6 week f/u - Occas SOB and wheezing since change from Advair to [place] more often - Occas non prod cough  Pt is better vs April/may.  dulera does not work as well as advair.  2-3 hours before dose is due dulera wears off   PUL ASTHMA HISTORY 09/01/2013 07/22/2012 02/19/2012 10/22/2011 04/03/2011  Symptoms >2 days/week 0-2 days/week 0-2 days/week 0-2 days/week 0-2 days/week  Nighttime awakenings 0-2/month 0-2/month 0-2/month 0-2/month 0-2/month  Interference with activity No limitations No limitations No limitations No limitations No limitations  SABA use > 2 days/wk--not > 1 x/day 0-2 days/wk 0-2 days/wk 0-2 days/wk 0-2 days/wk  Exacerbations requiring oral steroids 2 or more / year 0-1 / year 0-1 / year 0-1 / year 0-1 / year    Review of Systems  Constitutional:   No  weight loss, night sweats,  Fevers, chills, fatigue, or  lassitude.  HEENT:   No headaches,  Difficulty swallowing,  Tooth/dental problems, or  Sore throat,                No sneezing, itching, ear ache,  No nasal congestion, no post nasal drip,   CV:  No chest pain,  Orthopnea, PND, swelling in lower extremities, anasarca, dizziness, palpitations, syncope.   GI  No heartburn, indigestion, abdominal pain, nausea, vomiting, diarrhea, change in bowel habits, loss of appetite, bloody stools.   Resp:   No coughing up of blood.  No change in color of mucus.  No wheezing.  No chest wall deformity  Skin: no rash or lesions.  GU: no dysuria, change in color of urine, no  urgency or frequency.  No flank pain, no hematuria   MS:  No joint pain or swelling.  No decreased range of motion.  No back pain.  Psych:  No change in mood or affect. No depression or anxiety.  No memory loss.     Objective:   Physical Exam BP 142/78  Pulse 80  Ht 5\' 5"  (1.651 m)  Wt 152 lb (68.947 kg)  BMI 25.29 kg/m2  SpO2 97%   GEN: A/Ox3; pleasant , NAD, well nourished   HEENT:  [place]/AT,  EACs-clear, TMs-wnl, NOSE-clear, THROAT-clear, no lesions, no postnasal drip  Few scattered white patches in post pharynx- c/w thrush  NECK:  Supple w/ fair ROM; no JVD; normal carotid impulses w/o bruits; no thyromegaly or nodules palpated; no lymphadenopathy.  RESP  Clear  P & A; w/o, wheezes/ rales/ or rhonchi.no accessory muscle use, no dullness to percussion  CARD:  RRR, no m/r/g  , no peripheral edema, pulses intact, no cyanosis or clubbing.  GI:   Soft & nt; nml bowel sounds; no organomegaly or masses detected.  Musco: Warm bil, no deformities or joint swelling noted.   Neuro: alert, no focal deficits noted.    Skin: Warm, no lesions or rashes     Assessment & Plan:   Moderate persistent asthma with significant atopic features Moderate persistent asthma with significant atopic features and recent exacerbations  in the spring now improved Plan Maintain Ruthe Mannan Return 2 months Note the patient is about to go on a 2 week cruise to [place] and I gave the patient prescriptions for prednisone and azithromycin take on the trip. She is to call or return to tell us if these were taken or not   Updated Medication List Outpatient Encounter Prescriptions as of 09/01/2013  Medication Sig  . acetaminophen (TYLENOL) 500 MG tablet Take 500 mg by mouth every 6 (six) hours as needed for pain.  [name] ALPRAZolam (XANAX) 0.25 MG tablet Take 1 tablet (0.25 mg total) by mouth 2 (two) times daily as needed for anxiety or sleep.  . Calcium Carbonate-Vitamin D (CALCIUM-VITAMIN D) 500-200 MG-UNIT per  tablet Take 1 tablet by mouth daily.    . Cholecalciferol (VITAMIN D-3) 5000 UNITS TABS Take 1,000 Units by mouth daily.   [name] estradiol (VIVELLE-DOT) 0.075 MG/24HR Place 1 patch onto the skin 2 (two) times a week.  . fluticasone (FLONASE) 50 MCG/ACT nasal spray Place 2 sprays into both nostrils 2 (two) times daily.  [name] levalbuterol (XOPENEX HFA) 45 MCG/ACT inhaler Inhale 1-2 puffs into the lungs every 4 (four) hours as needed for shortness of breath.  . mometasone-formoterol (DULERA) 200-5 MCG/ACT AERO Inhale 2 puffs into the lungs 2 (two) times daily.  . montelukast (SINGULAIR) 10 MG tablet TAKE ONE TABLET BY MOUTH EVERY DAY  . azithromycin (ZITHROMAX) 250 MG tablet Take two once then one daily until gone  . HYDROMET 5-1.5 MG/5ML syrup 1 Tsp every 6 hours as needed  . Melatonin 3 MG TABS Take 1 tablet by mouth at bedtime.   . predniSONE (DELTASONE) 10 MG tablet Take 4 for two days three for two days two for two days one for two days  . [DISCONTINUED] clotrimazole (MYCELEX) 10 MG troche Take 1 tablet (10 mg total) by mouth 5 (five) times daily.

## 2013-09-14 ENCOUNTER — Ambulatory Visit: Payer: Medicare PPO | Admitting: Cardiovascular Disease

## 2013-10-02 DIAGNOSIS — D126 Benign neoplasm of colon, unspecified: Secondary | ICD-10-CM

## 2013-10-02 HISTORY — DX: Benign neoplasm of colon, unspecified: D12.6

## 2013-10-04 ENCOUNTER — Encounter: Payer: Self-pay | Admitting: Nurse Practitioner

## 2013-10-04 ENCOUNTER — Ambulatory Visit (INDEPENDENT_AMBULATORY_CARE_PROVIDER_SITE_OTHER): Payer: Medicare PPO | Admitting: Nurse Practitioner

## 2013-10-04 VITALS — BP 130/90 | HR 81 | Ht 65.0 in | Wt 151.0 lb

## 2013-10-04 DIAGNOSIS — I4892 Unspecified atrial flutter: Secondary | ICD-10-CM

## 2013-10-04 DIAGNOSIS — R030 Elevated blood-pressure reading, without diagnosis of hypertension: Secondary | ICD-10-CM

## 2013-10-04 DIAGNOSIS — IMO0001 Reserved for inherently not codable concepts without codable children: Secondary | ICD-10-CM

## 2013-10-04 NOTE — Patient Instructions (Addendum)
Ok to resume low dose aspirin  Please call Dr. Asa Lente about your back/legs/feet issues  See Dr. Burt Knack in a year  Monitor your blood pressure at home - an Omron brand is a good one to buy - check several times a week and keep a diary  If BP readings consistently above 140/90 - call and let us know  Call the [place] office at [phone] if you have any questions, problems or concerns.

## 2013-10-04 NOTE — Progress Notes (Signed)
[name] Date of Birth: 1945-06-28 Medical Record #[id number]  History of Present Illness: Elizabeth Mcconnell is seen back today for a 6 month check. Seen for Dr. Burt Knack. Seen previously by Dr. Rayann Heman for EP. Was a former patient of Dr. Winnifred Friar and was to establish with Dr. [place] but seen as a work in by Dr. Burt Knack. Has a history of paroxysmal atrial flutter (usually triggered by her asthma attacks), past pneumothorax, past tobacco abuse, severe atopic asthma and allergic rhinitis. She has failed cardioversion, subsequent ablation by Dr. Rayann Heman in December of 2014. She has had no known atrial fibrillation.   Last seen in January by EP - doing well. Eliquis stopped.  Comes back today. Here alone. Doing ok. Upset that she could not see Dr. Burt Knack. Her rhythm has been ok. No palpitations. She has had a few bouts of her asthma and had to have prednisone which did not bother her from our standpoint. She is noting less stamina. More trouble walking due to back/leg/feet issues. Does do deep water aerobics - once or twice a week. Feels like her back is really limiting her activity level. BP a little high here today. Past visits with other providers are ok. She does not monitor at home. No chest pain. Not short of breath.   Current Outpatient Prescriptions  Medication Sig Dispense Refill  . acetaminophen (TYLENOL) 500 MG tablet Take 500 mg by mouth every 6 (six) hours as needed for pain.      [name] ALPRAZolam (XANAX) 0.25 MG tablet Take 1 tablet (0.25 mg total) by mouth 2 (two) times daily as needed for anxiety or sleep.  30 tablet  2  . Calcium Carbonate-Vitamin D (CALCIUM-VITAMIN D) 500-200 MG-UNIT per tablet Take 1 tablet by mouth daily.        . Cholecalciferol (VITAMIN D-3) 5000 UNITS TABS Take 1,000 Units by mouth daily.       [name] estradiol (VIVELLE-DOT) 0.075 MG/24HR Place 1 patch onto the skin 2 (two) times a week.  8 patch  11  . fluticasone (FLONASE) 50 MCG/ACT nasal spray Place 2 sprays into both  nostrils 2 (two) times daily.  16 g  6  . levalbuterol (XOPENEX HFA) 45 MCG/ACT inhaler Inhale 1-2 puffs into the lungs every 4 (four) hours as needed for shortness of breath.      . Melatonin 3 MG TABS Take 1 tablet by mouth as needed.       . mometasone-formoterol (DULERA) 200-5 MCG/ACT AERO Inhale 2 puffs into the lungs 2 (two) times daily.  13 g  6  . montelukast (SINGULAIR) 10 MG tablet TAKE ONE TABLET BY MOUTH EVERY DAY  30 tablet  6   No current facility-administered medications for this visit.    Allergies  Allergen Reactions  . Tramadol Other (See Comments)    Severe dizziness, weakness  . Amoxicillin-Pot Clavulanate Nausea And Vomiting  . Flecainide     dizziness  . Moxifloxacin Hives, Itching and Rash    Past Medical History  Diagnosis Date  . Tobacco abuse   . Allergic rhinitis   . Oral candidiasis   . Atrial flutter     failed cardioversion 11/2012; s/p ablation 02-02-2013 by Dr Rayann Heman  . Asthma   . Pneumothorax on left 1996    left lower lung collapse s/p resection  . Arthritis   . Osteopenia 08/2013     T score -2.3 FRAX 17%/3.0% stable from prior DEXA  . ALLERGIC RHINITIS   . ANXIETY   .  Endometriosis     Past Surgical History  Procedure Laterality Date  . Lung surgery  02/1965  . Cesarean section      X 2  . Appendectomy    . Total abdominal hysterectomy w/ bilateral salpingoophorectomy  1984    Endometriosis  . Cardioversion N/A 11/27/2012    Procedure: CARDIOVERSION;  Surgeon: Thayer Headings, MD;  [facility] END[facility]COPY;  Service: Cardiovascular;  Laterality: N/A;  . Ablation  02-02-2013    CTI ablation by Dr Rayann Heman   . Abdominal hysterectomy      History  Smoking status  . Former Smoker -- 0.50 packs/day for 20 years  . Quit date: 03/04/2004  Smokeless tobacco  . Never Used    History  Alcohol Use  . 7.0 oz/week  . 14 drink(s) per week    Comment: social    Family History  Problem Relation Age of Onset  . Stroke Father 14  .  Hypertension Father   . Heart disease Father   . Diabetes Mother     Review of Systems: The review of systems is per the HPI.  All other systems were reviewed and are negative.  Physical Exam: BP 130/90  Pulse 81  Ht 5\' 5"  (1.651 m)  Wt 151 lb (68.493 kg)  BMI 25.13 kg/m2 Patient is very pleasant and in no acute distress. Skin is warm and dry. Color is normal.  HEENT is unremarkable. Normocephalic/atraumatic. PERRL. Sclera are nonicteric. Neck is supple. No masses. No JVD. Lungs are clear. Cardiac exam shows a regular rate and rhythm. Abdomen is soft. Extremities are without edema. Distal pulses are great. Gait and ROM are intact. No gross neurologic deficits noted.  Wt Readings from Last 3 Encounters:  10/04/13 151 lb (68.493 kg)  09/01/13 152 lb (68.947 kg)  07/30/13 149 lb (67.586 kg)    LABORATORY DATA/PROCEDURES: EKG today shows   Lab Results  Component Value Date   WBC 9.3 06/02/2013   HGB 14.6 06/02/2013   HCT 43.6 06/02/2013   PLT 179.0 06/02/2013   GLUCOSE 101* 06/02/2013   CHOL 257* 06/02/2013   TRIG 126.0 06/02/2013   HDL 80.50 06/02/2013   LDLCALC 151* 06/02/2013   ALT 16 06/02/2013   AST 18 06/02/2013   NA 136 06/02/2013   K 3.9 06/02/2013   CL 102 06/02/2013   CREATININE 0.8 06/02/2013   BUN 13 06/02/2013   CO2 27 06/02/2013   TSH 1.26 06/02/2013   INR 1.2* 01/25/2013    BNP (last 3 results) No results found for this basename: PROBNP,  in the last 8760 hours  Echo Study Conclusions from June 2014  - Left ventricle: The cavity size was normal. There was mild focal basal hypertrophy of the septum. Systolic function was normal. The estimated ejection fraction was in the range of 60% to 65%. Wall motion was normal; there were no regional wall motion abnormalities. Doppler parameters are consistent with abnormal left ventricular relaxation (grade 1 diastolic dysfunction). - Atrial septum: No defect or patent foramen ovale was identified.   Assessment / Plan: 1. Atrial flutter -  past ablation - remains in sinus by EKG today. I have left her on her current regimen.   2. Elevated BP - recheck by me is 140/90. She is agreeable to getting a BP cuff and monitoring at home. Call us if consistently above 140/90. Would restart her low dose baby aspirin as well - she has never had trouble in the past with this.  3. Back/leg/feet pain - encouraged her to speak with her PCP. May need repeat evaluation.   See back in a year.   Patient is agreeable to this plan and will call if any problems develop in the interim.   Burtis Junes, RN, [place] [address] [place], [place]  [phone]

## 2013-10-08 ENCOUNTER — Ambulatory Visit (AMBULATORY_SURGERY_CENTER): Payer: Self-pay

## 2013-10-08 ENCOUNTER — Other Ambulatory Visit: Payer: Self-pay | Admitting: Internal Medicine

## 2013-10-08 VITALS — Ht 65.0 in | Wt 150.0 lb

## 2013-10-08 DIAGNOSIS — Z1211 Encounter for screening for malignant neoplasm of colon: Secondary | ICD-10-CM

## 2013-10-08 MED ORDER — MOVIPREP 100 G PO SOLR: 1.0000 | Freq: Once | ORAL | Status: DC

## 2013-10-08 NOTE — Progress Notes (Signed)
No allergies to eggs or soy No home oxygen No diet/weight loss meds No past problems with anesthesia  Has email  Emmi instructions given for colonoscopy 

## 2013-10-14 ENCOUNTER — Encounter: Payer: Self-pay | Admitting: Internal Medicine

## 2013-10-22 ENCOUNTER — Encounter: Payer: Medicare PPO | Admitting: Internal Medicine

## 2013-10-27 ENCOUNTER — Encounter: Payer: Self-pay | Admitting: Internal Medicine

## 2013-10-27 ENCOUNTER — Ambulatory Visit (AMBULATORY_SURGERY_CENTER): Payer: Medicare PPO | Admitting: Internal Medicine

## 2013-10-27 VITALS — BP 157/76 | HR 66 | Temp 95.5°F | Resp 20 | Ht 65.0 in | Wt 150.0 lb

## 2013-10-27 DIAGNOSIS — D126 Benign neoplasm of colon, unspecified: Secondary | ICD-10-CM

## 2013-10-27 DIAGNOSIS — Z1211 Encounter for screening for malignant neoplasm of colon: Secondary | ICD-10-CM

## 2013-10-27 MED ORDER — SODIUM CHLORIDE 0.9 % IV SOLN: 500.0000 mL | INTRAVENOUS | Status: DC

## 2013-10-27 NOTE — Progress Notes (Signed)
Report to PACU, RN, vss, BBS= Clear.  

## 2013-10-27 NOTE — Progress Notes (Signed)
Called to room to assist during endoscopic procedure.  Patient ID and intended procedure confirmed with present staff. Received instructions for my participation in the procedure from the performing physician.  

## 2013-10-27 NOTE — Op Note (Signed)
[place]  [address] [place], [address]   COLONOSCOPY PROCEDURE REPORT  PATIENT: Elizabeth Mcconnell, Elizabeth Mcconnell  MR#: [id number] BIRTHDATE: 1945/08/05 , 68  yrs. old GENDER: Female ENDOSCOPIST: Lafayette Dragon, MD REFERRED SK:[id number] Asa Lente, M.D. PROCEDURE DATE:  10/27/2013 PROCEDURE:   Colonoscopy with cold biopsy polypectomy First Screening Colonoscopy - Avg.  risk and is 50 yrs.  old or older - No.  Prior Negative Screening - Now for repeat screening. 10 or more years since last screening  History of Adenoma - Now for follow-up colonoscopy & has been > or = to 3 yrs.  N/A  Polyps Removed Today? Yes. ASA CLASS:   Class II INDICATIONS:Average risk patient for colon cancer and prior colonoscopy in March 2005 showed diverticulosis. MEDICATIONS: MAC sedation, administered by CRNA and propofol (Diprivan) 300mg  IV  DESCRIPTION OF PROCEDURE:   After the risks benefits and alternatives of the procedure were thoroughly explained, informed consent was obtained.  A digital rectal exam revealed no abnormalities of the rectum.   The LB [id number]  endoscope was introduced through the anus and advanced to the cecum, which was identified by both the appendix and ileocecal valve. No adverse events experienced.   The quality of the prep was good, using MoviPrep  The instrument was then slowly withdrawn as the colon was fully examined.      COLON FINDINGS: A polypoid shaped semi-pedunculated polyp ranging between 3-68mm in size was found in the sigmoid colon.  A polypectomy was performed with cold forceps.  The resection was complete and the polyp tissue was completely retrieved.   There was severe diverticulosis noted in the sigmoid colon with associated angulation, tortuosity, muscular hypertrophy and colonic narrowing. Retroflexed views revealed no abnormalities. The time to cecum=10 minutes 31 seconds.  Withdrawal time=6 minutes 40 seconds.  The scope was withdrawn  and the procedure completed. COMPLICATIONS: There were no complications.  ENDOSCOPIC IMPRESSION: 1.   Semi-pedunculated polyp ranging between 3-43mm in size was found in the sigmoid colon; polypectomy was performed with cold forceps 2.   There was severe diverticulosis noted in the sigmoid colon  RECOMMENDATIONS: high fiber diet Metamucil 1 teaspoon daily Recall colonoscopy pending path report   eSigned:  Lafayette Dragon, MD 10/27/2013 9:38 AM   cc:   PATIENT NAME:  [name], [name] MR#: [id number]

## 2013-10-27 NOTE — Patient Instructions (Signed)
Discharge instructions given with verbal understanding. Handouts on polyps and diverticulosis. Resume previous medications. YOU HAD AN ENDOSCOPIC PROCEDURE TODAY AT THE [facility]: Refer to the procedure report that was given to you for any specific questions about what was found during the examination.  If the procedure report does not answer your questions, please call your gastroenterologist to clarify.  If you requested that your care partner not be given the details of your procedure findings, then the procedure report has been included in a sealed envelope for you to review at your convenience later.  YOU SHOULD EXPECT: Some feelings of bloating in the abdomen. Passage of more gas than usual.  Walking can help get rid of the air that was put into your GI tract during the procedure and reduce the bloating. If you had a lower endoscopy (such as a colonoscopy or flexible sigmoidoscopy) you may notice spotting of blood in your stool or on the toilet paper. If you underwent a bowel prep for your procedure, then you may not have a normal bowel movement for a few days.  DIET: Your first meal following the procedure should be a light meal and then it is ok to progress to your normal diet.  A half-sandwich or bowl of soup is an example of a good first meal.  Heavy or fried foods are harder to digest and may make you feel nauseous or bloated.  Likewise meals heavy in dairy and vegetables can cause extra gas to form and this can also increase the bloating.  Drink plenty of fluids but you should avoid alcoholic beverages for 24 hours.  ACTIVITY: Your care partner should take you home directly after the procedure.  You should plan to take it easy, moving slowly for the rest of the day.  You can resume normal activity the day after the procedure however you should NOT DRIVE or use heavy machinery for 24 hours (because of the sedation medicines used during the test).    SYMPTOMS TO REPORT  IMMEDIATELY: A gastroenterologist can be reached at any hour.  During normal business hours, 8:30 AM to 5:00 PM Monday through Friday, call [phone].  After hours and on weekends, please call the GI answering service at [phone] who will take a message and have the physician on call contact you.   Following lower endoscopy (colonoscopy or flexible sigmoidoscopy):  Excessive amounts of blood in the stool  Significant tenderness or worsening of abdominal pains  Swelling of the abdomen that is new, acute  Fever of 100F or higher  FOLLOW UP: If any biopsies were taken you will be contacted by phone or by letter within the next 1-3 weeks.  Call your gastroenterologist if you have not heard about the biopsies in 3 weeks.  Our staff will call the home number listed on your records the next business day following your procedure to check on you and address any questions or concerns that you may have at that time regarding the information given to you following your procedure. This is a courtesy call and so if there is no answer at the home number and we have not heard from you through the emergency physician on call, we will assume that you have returned to your regular daily activities without incident.  SIGNATURES/CONFIDENTIALITY: You and/or your care partner have signed paperwork which will be entered into your electronic medical record.  These signatures attest to the fact that that the information above on your After Visit Summary   has been reviewed and is understood.  Full responsibility of the confidentiality of this discharge information lies with you and/or your care-partner. 

## 2013-10-28 ENCOUNTER — Telehealth: Payer: Self-pay

## 2013-10-28 NOTE — Telephone Encounter (Signed)
  Follow up Call-  Call back number 10/27/2013  Post procedure Call Back phone  # [phone]  Permission to leave phone message Yes     Patient questions:  Do you have a fever, pain , or abdominal swelling? No. Pain Score  0 *  Have you tolerated food without any problems? Yes.    Have you been able to return to your normal activities? Yes.    Do you have any questions about your discharge instructions: Diet   No. Medications  No. Follow up visit  No.  Do you have questions or concerns about your Care? No.  Actions: * If pain score is 4 or above: No action needed, pain <4.

## 2013-11-01 ENCOUNTER — Encounter: Payer: Self-pay | Admitting: Internal Medicine

## 2013-11-03 ENCOUNTER — Encounter: Payer: Self-pay | Admitting: *Deleted

## 2013-11-03 ENCOUNTER — Other Ambulatory Visit: Payer: Self-pay

## 2013-11-03 ENCOUNTER — Ambulatory Visit (INDEPENDENT_AMBULATORY_CARE_PROVIDER_SITE_OTHER): Payer: Medicare PPO

## 2013-11-03 VITALS — BP 154/80 | HR 82 | Resp 16 | Ht 65.0 in | Wt 151.0 lb

## 2013-11-03 DIAGNOSIS — M722 Plantar fascial fibromatosis: Secondary | ICD-10-CM

## 2013-11-03 DIAGNOSIS — M7730 Calcaneal spur, unspecified foot: Secondary | ICD-10-CM

## 2013-11-03 DIAGNOSIS — M79609 Pain in unspecified limb: Secondary | ICD-10-CM

## 2013-11-03 DIAGNOSIS — M79673 Pain in unspecified foot: Secondary | ICD-10-CM

## 2013-11-03 DIAGNOSIS — M7731 Calcaneal spur, right foot: Secondary | ICD-10-CM

## 2013-11-03 MED ORDER — MELOXICAM 15 MG PO TABS: 15.0000 mg | ORAL_TABLET | Freq: Every day | ORAL | Status: DC

## 2013-11-03 NOTE — Patient Instructions (Signed)

## 2013-11-03 NOTE — Progress Notes (Signed)
   Subjective:    Patient ID: Elizabeth Mcconnell, female    DOB: 05/31/1945, 68 y.o.   MRN: [id number]  HPI Comments: "Its the right foot"  Patient c/o aching plantar heel and arch right for about 6 wks. She says its only been in the arch about 2 wks. She has noticed that the lateral side has become bothersome. She has been icing and wearing sneakers with OTC insoles. Walking a lot aggravates it.  Foot Pain Associated symptoms include arthralgias.      Review of Systems  Musculoskeletal: Positive for arthralgias, back pain and gait problem.  Hematological: Bruises/bleeds easily.  All other systems reviewed and are negative.      Objective:   Physical Exam 68 year old white female well-developed well-nourished oriented x3 presents this time for new issued as painful right heel and right arch area going on for about 6 weeks or up to 2 months pain on first up in the morning or getting up off your press hurts all the time now also noting that the lateral side of her left foot is starting to be painful and symptomatic percent of her right foot is very painful and symptomatic she tries over-the-counter Dr. Felicie Morn insoles which haven't helped much walking as aggravated the foot patient does have a history some arthralgia.  Lower extremity objective findings reveals vascular status is intact pedal pulses palpable DP +2/4 bilateral PT one over 4 bilateral capillary refill time 3 seconds all the digits. Epicritic and proprioceptive sensations intact and symmetric bilateral there is normal plantar response DTRs not elicited. Neurologically skin color pigment normal hair growth present diminished distally nails unremarkable orthopedic exam there is pain on palpation of the medial heel medial calcaneal tubercle the right some tenderness along Lisfranc joint as well and inversion eversion. 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 for orthoses in the future as recommended maintain good stable shoe consider crocs for around the house or shoes or [employer] , [place], or Finn. Followup in 2 weeks for reevaluation and possible orthotic  Harriet Masson DPM

## 2013-11-17 ENCOUNTER — Ambulatory Visit (INDEPENDENT_AMBULATORY_CARE_PROVIDER_SITE_OTHER): Payer: Medicare PPO

## 2013-11-17 VITALS — BP 145/85 | HR 64 | Resp 17

## 2013-11-17 DIAGNOSIS — M79609 Pain in unspecified limb: Secondary | ICD-10-CM

## 2013-11-17 DIAGNOSIS — M722 Plantar fascial fibromatosis: Secondary | ICD-10-CM

## 2013-11-17 DIAGNOSIS — R609 Edema, unspecified: Secondary | ICD-10-CM

## 2013-11-17 DIAGNOSIS — M7731 Calcaneal spur, right foot: Secondary | ICD-10-CM

## 2013-11-17 DIAGNOSIS — M79673 Pain in unspecified foot: Secondary | ICD-10-CM

## 2013-11-17 DIAGNOSIS — M7730 Calcaneal spur, unspecified foot: Secondary | ICD-10-CM

## 2013-11-17 NOTE — Progress Notes (Signed)
   Subjective:    Patient ID: Elizabeth Mcconnell, female    DOB: January 06, 1946, 68 y.o.   MRN: [id number]  HPI  Pt presents as follow up for plantar fascial pain, she states she has noticed some improvement, but still has some intermittent pain, with noted swelling all over foot and some in the ankle. Pain is on top of foot and radiates to 5th met but isnt the same degree of pain as previous. Noted new bruising at 2nd met joint, pt denies any injury to area, has been on medications and using ice   Review of Systems no new findings or systemic changes noted patient describes the swelling over the forefoot midfoot and ankle this is possibly consistent with mild arthropathy     Objective:   Physical Exam Neurovascular status is intact and unchanged and she does complain of some mild edema right foot more so than left however has significant varicosities in both ankles likely weeks venous stasis and venous insufficiency. Are cannot rule arthropathy patient does have fascial symptomology and Lisfranc's arthropathy which would indicate swelling in the mid tarsus of the foot. Patient had significant improvement with the fascia taping currently wearing a pair of Naot type shoes with adequate support based on her improvement with fascial strapping would be a good candidate for orthoses however her Medicare program will likely not cover orthotics as alternative we'll try OTC orthotics such as power step switch or offered to the patient this time Elizabeth Mcconnell $45 patient agreed to this and will use these in her athletic or tennis shoes also recently purchased a pair of new balance shoes.       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 Patient will maintain good support orthotics are dispensed with break in wearing instructions at this time continue with NSAIDs or Tylenol as needed for pain warm compress ice pack can also help with the  swelling and inflammation over time the swelling should improve special with the maintained maintenance of support and good structure in her shoes. If not improved within the next 2 or 3 months followup in the future as needed next  Harriet Masson DPM

## 2013-11-17 NOTE — Patient Instructions (Signed)

## 2013-11-20 ENCOUNTER — Other Ambulatory Visit: Payer: Self-pay | Admitting: Critical Care Medicine

## 2013-11-24 ENCOUNTER — Telehealth: Payer: Self-pay | Admitting: Internal Medicine

## 2013-11-24 NOTE — Telephone Encounter (Signed)
Please start her on [place] OTC 2 tabs daily x 2 weeks. Also  Has she resumed  Her physical activity?Also, Try OTC Probiotic to take  1 box ( about 21 days).1 po qd

## 2013-11-24 NOTE — Telephone Encounter (Signed)
Patient given recommendations. 

## 2013-11-24 NOTE — Telephone Encounter (Signed)
Patient states she has not had regular bowel movements since colonoscopy. Reports that she has increased her fiber intake but is still having constipation. She has small bowel movements. Starting last Friday, she had abdominal pain all across the abdomen below the belly button. She is feeling worse today. Denies fever. She did have chills yesterday. Please, advise.

## 2013-11-26 ENCOUNTER — Telehealth: Payer: Self-pay | Admitting: Internal Medicine

## 2013-11-26 NOTE — Telephone Encounter (Signed)
We had to move patients appt b.c of provider schedule change.  She just had a colonoscopy.  She feels something is wrong with her.  She did not want to see any other physicians or go to the ER.  She is requesting to be worked in sooner.

## 2013-11-27 NOTE — Telephone Encounter (Signed)
Ok to add on as work in on 11:15 for 10/1

## 2013-11-29 NOTE — Telephone Encounter (Signed)
Got scheduled  °

## 2013-12-02 ENCOUNTER — Ambulatory Visit (INDEPENDENT_AMBULATORY_CARE_PROVIDER_SITE_OTHER): Payer: Medicare PPO | Admitting: Internal Medicine

## 2013-12-02 ENCOUNTER — Other Ambulatory Visit (INDEPENDENT_AMBULATORY_CARE_PROVIDER_SITE_OTHER): Payer: Medicare PPO

## 2013-12-02 ENCOUNTER — Encounter: Payer: Self-pay | Admitting: Internal Medicine

## 2013-12-02 VITALS — BP 128/80 | HR 76 | Temp 98.1°F | Ht 65.0 in | Wt 148.2 lb

## 2013-12-02 DIAGNOSIS — R1030 Lower abdominal pain, unspecified: Secondary | ICD-10-CM

## 2013-12-02 DIAGNOSIS — R1032 Left lower quadrant pain: Secondary | ICD-10-CM

## 2013-12-02 LAB — HEPATIC FUNCTION PANEL
ALT: 11 U/L (ref 0–35)
AST: 21 U/L (ref 0–37)
Albumin: 4.3 g/dL (ref 3.5–5.2)
Alkaline Phosphatase: 96 U/L (ref 39–117)
Bilirubin, Direct: 0.1 mg/dL (ref 0.0–0.3)
Total Bilirubin: 0.5 mg/dL (ref 0.2–1.2)
Total Protein: 8.2 g/dL (ref 6.0–8.3)

## 2013-12-02 LAB — URINALYSIS, ROUTINE W REFLEX MICROSCOPIC
Bilirubin Urine: NEGATIVE
Hgb urine dipstick: NEGATIVE
Ketones, ur: NEGATIVE
Leukocytes, UA: NEGATIVE
Nitrite: NEGATIVE
Specific Gravity, Urine: <=1.005 — AB (ref 1.000–1.030)
Total Protein, Urine: NEGATIVE
Urine Glucose: NEGATIVE
Urobilinogen, UA: 0.2 (ref 0.0–1.0)
pH: 7 (ref 5.0–8.0)

## 2013-12-02 LAB — CBC WITH DIFFERENTIAL/PLATELET
Basophils Absolute: 0 10*3/uL (ref 0.0–0.1)
Basophils Relative: 0.4 % (ref 0.0–3.0)
Eosinophils Absolute: 0.4 10*3/uL (ref 0.0–0.7)
Eosinophils Relative: 4.1 % (ref 0.0–5.0)
HCT: 42.1 % (ref 36.0–46.0)
Hemoglobin: 13.9 g/dL (ref 12.0–15.0)
Lymphocytes Relative: 26.1 % (ref 12.0–46.0)
Lymphs Abs: 2.3 10*3/uL (ref 0.7–4.0)
MCHC: 33 g/dL (ref 30.0–36.0)
MCV: 87.8 fl (ref 78.0–100.0)
Monocytes Absolute: 0.5 10*3/uL (ref 0.1–1.0)
Monocytes Relative: 6.1 % (ref 3.0–12.0)
Neutro Abs: 5.7 10*3/uL (ref 1.4–7.7)
Neutrophils Relative %: 63.3 % (ref 43.0–77.0)
Platelets: 330 10*3/uL (ref 150.0–400.0)
RBC: 4.8 Mil/uL (ref 3.87–5.11)
RDW: 13.7 % (ref 11.5–15.5)
WBC: 8.9 10*3/uL (ref 4.0–10.5)

## 2013-12-02 LAB — BASIC METABOLIC PANEL
BUN: 11 mg/dL (ref 6–23)
CO2: 28 mEq/L (ref 19–32)
Calcium: 9.9 mg/dL (ref 8.4–10.5)
Chloride: 103 mEq/L (ref 96–112)
Creatinine, Ser: 0.7 mg/dL (ref 0.4–1.2)
GFR: 82.42 mL/min (ref >60.00)
Glucose, Bld: 82 mg/dL (ref 70–99)
Potassium: 4 mEq/L (ref 3.5–5.1)
Sodium: 139 mEq/L (ref 135–145)

## 2013-12-02 MED ORDER — EQ PROBIOTIC PO CAPS: 1.0000 | ORAL_CAPSULE | Freq: Every day | ORAL | Status: DC

## 2013-12-02 MED ORDER — SENNA-DOCUSATE SODIUM 8.6-50 MG PO TABS: 2.0000 | ORAL_TABLET | Freq: Every day | ORAL | Status: DC

## 2013-12-02 MED ORDER — DICYCLOMINE HCL 10 MG PO CAPS: 10.0000 mg | ORAL_CAPSULE | Freq: Three times a day (TID) | ORAL | Status: DC | PRN

## 2013-12-02 NOTE — Patient Instructions (Signed)
It was good to see you today.  We have reviewed your prior records including labs and tests today  Test(s) ordered today. Your results will be released to [place] (or called to you) after review, usually within 72hours after test completion. If any changes need to be made, you will be notified at that same time.  Medications reviewed and updated Continue Peri-Colace and probiotic daily Use anti-spasmatic 3 times daily as needed for spasm and abdominal pain - Your prescription(s) have been submitted to your pharmacy. Please take as directed and contact our office if you believe you are having problem(s) with the medication(s). No other changes recommended at this time. Refill on medication(s) as discussed today.  If pain symptoms unimproved by next week, please call for further recommendations and/or testing as needed

## 2013-12-02 NOTE — Progress Notes (Signed)
Pre visit review using our clinic review tool, if applicable. No additional management support is needed unless otherwise documented below in the visit note. 

## 2013-12-02 NOTE — Progress Notes (Signed)
Subjective:    Patient ID: Elizabeth Mcconnell, female    DOB: [birth year], 68 y.o.   MRN: [id number]  HPI  Patient is here for follow up  Reviewed chronic medical issues and interval medical events  Past Medical History  Diagnosis Date  . Tobacco abuse   . Allergic rhinitis   . Oral candidiasis   . Atrial flutter     failed cardioversion 11/2012; s/p ablation 02-02-2013 by Dr Rayann Heman  . Asthma   . Pneumothorax on left 1996    left lower lung collapse s/p resection  . Arthritis   . Osteopenia 08/2013     T score -2.3 FRAX 17%/3.0% stable from prior DEXA  . ALLERGIC RHINITIS   . ANXIETY   . Endometriosis   . Adenomatous polyp of colon 10/2013    f/u colo 10/2018    Review of Systems  Constitutional: Positive for chills (last week) and fatigue ("sick" and not right x 1 mo since colo). Negative for fever ("low temp" x 1 week).  Gastrointestinal: Positive for abdominal pain (x 57mo since colo) and constipation (x 1 mo). Negative for nausea, diarrhea, blood in stool, abdominal distention and anal bleeding.       Abd cramping x 1 mo since colo a/w constipation       Objective:   Physical Exam  BP 128/80  Pulse 76  Temp(Src) 98.1 F (36.7 C) (Oral)  Ht 5\' 5"  (1.651 m)  Wt 148 lb 4 oz (67.246 kg)  BMI 24.67 kg/m2  SpO2 97% Wt Readings from Last 3 Encounters:  12/02/13 148 lb 4 oz (67.246 kg)  11/03/13 151 lb (68.493 kg)  10/27/13 150 lb (68.04 kg)   Constitutional: Elizabeth Mcconnell is overweight, appears well-developed and well-nourished. No distress.  Neck: Normal range of motion. Neck supple. No JVD present. No thyromegaly present.  Cardiovascular: Normal rate, regular rhythm and normal heart sounds.  No murmur heard. No BLE edema. Pulmonary/Chest: Effort normal and breath sounds normal. No respiratory distress. Elizabeth Mcconnell has no wheezes.  Abdomen: mildly tender over LLQ and suprapubic region - no R/G - ND, +BS Psychiatric: Elizabeth Mcconnell has a mildly anxious mood and affect. Her behavior is normal.  Judgment and thought content normal.   Lab Results  Component Value Date   WBC 9.3 06/02/2013   HGB 14.6 06/02/2013   HCT 43.6 06/02/2013   PLT 179.0 06/02/2013   GLUCOSE 101* 06/02/2013   CHOL 257* 06/02/2013   TRIG 126.0 06/02/2013   HDL 80.50 06/02/2013   LDLCALC 151* 06/02/2013   ALT 16 06/02/2013   AST 18 06/02/2013   NA 136 06/02/2013   K 3.9 06/02/2013   CL 102 06/02/2013   CREATININE 0.8 06/02/2013   BUN 13 06/02/2013   CO2 27 06/02/2013   TSH 1.26 06/02/2013   INR 1.2* 01/25/2013    Dg Chest 2 View  06/30/2013   CLINICAL DATA:  acute bronchitis  EXAM: CHEST  2 VIEW  COMPARISON:  None.  FINDINGS: The lungs are hyperinflated. There is flattening of the hemidiaphragms. Chronic changes are appreciated within the left lung apex and left hilar region. Mild increased density is identified within the left lung base with linear components. No further regions of consolidation or focal infiltrates. No acute osseus abnormalities. Levoscoliosis within the thoracic spine.  IMPRESSION: Chronic changes in the left hemi thorax.  Area of increased density left lung base differential considerations atelectasis versus scarring versus an infiltrate.  COPD   Electronically Signed   By: Cori Razor  Burt Knack M.D.   On: 06/30/2013 13:11       Assessment & Plan:   Left lower cautery and and super pubic pain with cramping. Intermittent but progressively worse in past 2 weeks. Patient reports ongoing since polypectomy and colonoscopy late August 2015. Exam of tenderness but no rebound or guarding, bowel sounds normal. We'll check labs including urinalysis to exclude UTI or stone as cause of symptoms. Treat with anti-spasmodic in addition to ongoing probiotic and stool softener. If labs unremarkable and persisting symptoms, patient to call for followup to arrange imaging as needed next week  Problem List Items Addressed This Visit   None    Visit Diagnoses   LLQ cramping    -  Primary    Relevant Orders       Basic metabolic panel        CBC with Differential       Hepatic function panel       Urinalysis, Routine w reflex microscopic    Suprapubic abdominal pain, unspecified laterality        Relevant Orders       Basic metabolic panel       CBC with Differential       Hepatic function panel       Urinalysis, Routine w reflex microscopic

## 2013-12-03 ENCOUNTER — Ambulatory Visit: Payer: Medicare PPO | Admitting: Internal Medicine

## 2013-12-06 ENCOUNTER — Encounter: Payer: Self-pay | Admitting: Critical Care Medicine

## 2013-12-10 ENCOUNTER — Emergency Department (HOSPITAL_COMMUNITY): Payer: Medicare PPO

## 2013-12-10 ENCOUNTER — Encounter (HOSPITAL_COMMUNITY): Payer: Self-pay | Admitting: Emergency Medicine

## 2013-12-10 ENCOUNTER — Emergency Department (HOSPITAL_COMMUNITY)
Admission: EM | Admit: 2013-12-10 | Discharge: 2013-12-10 | Disposition: A | Discharge status: Home / Self Care | Payer: Medicare PPO | Attending: Emergency Medicine | Admitting: Emergency Medicine

## 2013-12-10 ENCOUNTER — Telehealth: Payer: Self-pay | Admitting: Internal Medicine

## 2013-12-10 ENCOUNTER — Emergency Department (HOSPITAL_COMMUNITY)
Admission: EM | Admit: 2013-12-10 | Discharge: 2013-12-10 | Disposition: A | Discharge status: Nursing Home (Includes ICF) | Payer: Medicare PPO | Source: Home / Self Care | Attending: Family Medicine | Admitting: Family Medicine

## 2013-12-10 DIAGNOSIS — F411 Generalized anxiety disorder: Secondary | ICD-10-CM | POA: Diagnosis not present

## 2013-12-10 DIAGNOSIS — Z8619 Personal history of other infectious and parasitic diseases: Secondary | ICD-10-CM | POA: Diagnosis not present

## 2013-12-10 DIAGNOSIS — Z8742 Personal history of other diseases of the female genital tract: Secondary | ICD-10-CM | POA: Insufficient documentation

## 2013-12-10 DIAGNOSIS — Z87891 Personal history of nicotine dependence: Secondary | ICD-10-CM | POA: Insufficient documentation

## 2013-12-10 DIAGNOSIS — Z90710 Acquired absence of both cervix and uterus: Secondary | ICD-10-CM | POA: Insufficient documentation

## 2013-12-10 DIAGNOSIS — Z880 Allergy status to penicillin: Secondary | ICD-10-CM | POA: Diagnosis not present

## 2013-12-10 DIAGNOSIS — M8580 Other specified disorders of bone density and structure, unspecified site: Secondary | ICD-10-CM | POA: Insufficient documentation

## 2013-12-10 DIAGNOSIS — R1032 Left lower quadrant pain: Secondary | ICD-10-CM

## 2013-12-10 DIAGNOSIS — K529 Noninfective gastroenteritis and colitis, unspecified: Secondary | ICD-10-CM

## 2013-12-10 DIAGNOSIS — M1990 Unspecified osteoarthritis, unspecified site: Secondary | ICD-10-CM | POA: Insufficient documentation

## 2013-12-10 DIAGNOSIS — J45909 Unspecified asthma, uncomplicated: Secondary | ICD-10-CM | POA: Diagnosis not present

## 2013-12-10 DIAGNOSIS — Z791 Long term (current) use of non-steroidal anti-inflammatories (NSAID): Secondary | ICD-10-CM | POA: Insufficient documentation

## 2013-12-10 DIAGNOSIS — Z79899 Other long term (current) drug therapy: Secondary | ICD-10-CM | POA: Insufficient documentation

## 2013-12-10 DIAGNOSIS — R1030 Lower abdominal pain, unspecified: Secondary | ICD-10-CM | POA: Diagnosis present

## 2013-12-10 DIAGNOSIS — Z7951 Long term (current) use of inhaled steroids: Secondary | ICD-10-CM | POA: Diagnosis not present

## 2013-12-10 DIAGNOSIS — Z86010 Personal history of colonic polyps: Secondary | ICD-10-CM | POA: Insufficient documentation

## 2013-12-10 DIAGNOSIS — K5900 Constipation, unspecified: Secondary | ICD-10-CM | POA: Insufficient documentation

## 2013-12-10 DIAGNOSIS — Z9089 Acquired absence of other organs: Secondary | ICD-10-CM | POA: Insufficient documentation

## 2013-12-10 DIAGNOSIS — K5732 Diverticulitis of large intestine without perforation or abscess without bleeding: Secondary | ICD-10-CM | POA: Diagnosis not present

## 2013-12-10 DIAGNOSIS — Z8679 Personal history of other diseases of the circulatory system: Secondary | ICD-10-CM | POA: Insufficient documentation

## 2013-12-10 DIAGNOSIS — Z79818 Long term (current) use of other agents affecting estrogen receptors and estrogen levels: Secondary | ICD-10-CM | POA: Insufficient documentation

## 2013-12-10 DIAGNOSIS — Z9889 Other specified postprocedural states: Secondary | ICD-10-CM | POA: Diagnosis not present

## 2013-12-10 DIAGNOSIS — Z7982 Long term (current) use of aspirin: Secondary | ICD-10-CM | POA: Diagnosis not present

## 2013-12-10 LAB — URINALYSIS, ROUTINE W REFLEX MICROSCOPIC
Bilirubin Urine: NEGATIVE
Glucose, UA: NEGATIVE mg/dL
Hgb urine dipstick: NEGATIVE
Ketones, ur: NEGATIVE mg/dL
Leukocytes, UA: NEGATIVE
Nitrite: NEGATIVE
Protein, ur: NEGATIVE mg/dL
Specific Gravity, Urine: 1.011 (ref 1.005–1.030)
Urobilinogen, UA: 0.2 mg/dL (ref 0.0–1.0)
pH: 6.5 (ref 5.0–8.0)

## 2013-12-10 LAB — POCT URINALYSIS DIP (DEVICE)
Bilirubin Urine: NEGATIVE
Glucose, UA: NEGATIVE mg/dL
Hgb urine dipstick: NEGATIVE
Ketones, ur: NEGATIVE mg/dL
Leukocytes, UA: NEGATIVE
Nitrite: NEGATIVE
Protein, ur: NEGATIVE mg/dL
Specific Gravity, Urine: 1.01 (ref 1.005–1.030)
Urobilinogen, UA: 0.2 mg/dL (ref 0.0–1.0)
pH: 7 (ref 5.0–8.0)

## 2013-12-10 LAB — COMPREHENSIVE METABOLIC PANEL
ALT: 10 U/L (ref 0–35)
AST: 17 U/L (ref 0–37)
Albumin: 4.3 g/dL (ref 3.5–5.2)
Alkaline Phosphatase: 106 U/L (ref 39–117)
Anion gap: 14 (ref 5–15)
BUN: 9 mg/dL (ref 6–23)
CO2: 24 mEq/L (ref 19–32)
Calcium: 9.5 mg/dL (ref 8.4–10.5)
Chloride: 100 mEq/L (ref 96–112)
Creatinine, Ser: 0.74 mg/dL (ref 0.50–1.10)
GFR calc Af Amer: >90 mL/min (ref >90)
GFR calc non Af Amer: 84 mL/min — ABNORMAL LOW (ref >90)
Glucose, Bld: 96 mg/dL (ref 70–99)
Potassium: 4.4 mEq/L (ref 3.7–5.3)
Sodium: 138 mEq/L (ref 137–147)
Total Bilirubin: 0.6 mg/dL (ref 0.3–1.2)
Total Protein: 8.2 g/dL (ref 6.0–8.3)

## 2013-12-10 LAB — CBC WITH DIFFERENTIAL/PLATELET
Basophils Absolute: 0 10*3/uL (ref 0.0–0.1)
Basophils Relative: 0 % (ref 0–1)
Eosinophils Absolute: 0.2 10*3/uL (ref 0.0–0.7)
Eosinophils Relative: 1 % (ref 0–5)
HCT: 44.6 % (ref 36.0–46.0)
Hemoglobin: 14.6 g/dL (ref 12.0–15.0)
Lymphocytes Relative: 10 % — ABNORMAL LOW (ref 12–46)
Lymphs Abs: 1.6 10*3/uL (ref 0.7–4.0)
MCH: 29.3 pg (ref 26.0–34.0)
MCHC: 32.7 g/dL (ref 30.0–36.0)
MCV: 89.4 fL (ref 78.0–100.0)
Monocytes Absolute: 0.9 10*3/uL (ref 0.1–1.0)
Monocytes Relative: 6 % (ref 3–12)
Neutro Abs: 12.7 10*3/uL — ABNORMAL HIGH (ref 1.7–7.7)
Neutrophils Relative %: 83 % — ABNORMAL HIGH (ref 43–77)
Platelets: 241 10*3/uL (ref 150–400)
RBC: 4.99 MIL/uL (ref 3.87–5.11)
RDW: 14 % (ref 11.5–15.5)
WBC: 15.4 10*3/uL — ABNORMAL HIGH (ref 4.0–10.5)

## 2013-12-10 LAB — LIPASE, BLOOD: Lipase: 65 U/L — ABNORMAL HIGH (ref 11–59)

## 2013-12-10 LAB — I-STAT CG4 LACTIC ACID, ED: Lactic Acid, Venous: 0.98 mmol/L (ref 0.5–2.2)

## 2013-12-10 MED ORDER — IOHEXOL 300 MG/ML  SOLN
25.0000 mL | INTRAMUSCULAR | Status: AC
  Administered 2013-12-10: 25 mL via ORAL

## 2013-12-10 MED ORDER — IOHEXOL 300 MG/ML  SOLN
100.0000 mL | Freq: Once | INTRAMUSCULAR | Status: AC | PRN
  Administered 2013-12-10: 100 mL via INTRAVENOUS

## 2013-12-10 MED ORDER — SULFAMETHOXAZOLE-TRIMETHOPRIM 800-160 MG PO TABS: 2.0000 | ORAL_TABLET | Freq: Two times a day (BID) | ORAL | Status: DC

## 2013-12-10 MED ORDER — ONDANSETRON HCL 4 MG/2ML IJ SOLN
4.0000 mg | Freq: Once | INTRAMUSCULAR | Status: AC
  Administered 2013-12-10: 4 mg via INTRAVENOUS
  Filled 2013-12-10: qty 2

## 2013-12-10 MED ORDER — ACETAMINOPHEN 325 MG PO TABS
975.0000 mg | ORAL_TABLET | Freq: Once | ORAL | Status: AC
  Administered 2013-12-10: 975 mg via ORAL
  Filled 2013-12-10: qty 3

## 2013-12-10 MED ORDER — SULFAMETHOXAZOLE-TMP DS 800-160 MG PO TABS
2.0000 | ORAL_TABLET | Freq: Once | ORAL | Status: AC
Start: 2013-12-10 — End: 2013-12-10
  Administered 2013-12-10: 2 via ORAL
  Filled 2013-12-10: qty 2

## 2013-12-10 MED ORDER — SODIUM CHLORIDE 0.9 % IV BOLUS (SEPSIS)
1000.0000 mL | Freq: Once | INTRAVENOUS | Status: AC
  Administered 2013-12-10: 1000 mL via INTRAVENOUS

## 2013-12-10 MED ORDER — MORPHINE SULFATE 4 MG/ML IJ SOLN
4.0000 mg | Freq: Once | INTRAMUSCULAR | Status: AC
  Administered 2013-12-10: 4 mg via INTRAVENOUS
  Filled 2013-12-10: qty 1

## 2013-12-10 MED ORDER — HYDROCODONE-ACETAMINOPHEN 5-325 MG PO TABS: 1.0000 | ORAL_TABLET | Freq: Four times a day (QID) | ORAL | Status: DC | PRN

## 2013-12-10 MED ORDER — METRONIDAZOLE 500 MG PO TABS
500.0000 mg | ORAL_TABLET | Freq: Once | ORAL | Status: AC
Start: 2013-12-10 — End: 2013-12-10
  Administered 2013-12-10: 500 mg via ORAL
  Filled 2013-12-10: qty 1

## 2013-12-10 MED ORDER — METRONIDAZOLE 500 MG PO TABS: 500.0000 mg | ORAL_TABLET | Freq: Two times a day (BID) | ORAL | Status: DC

## 2013-12-10 NOTE — ED Notes (Signed)
Change in date of colonoscopy aug 8th.  Pain since then  And problems

## 2013-12-10 NOTE — ED Notes (Signed)
She just had her blood drawn last Thursday at her doctors office

## 2013-12-10 NOTE — Telephone Encounter (Signed)
Pt called in said that she is still in pain and not getting any better.  She said that she as tried all the things that Dr Asa Lente told her to do and she wants to speak with a nurse to see what she needs to do    Best number to contact pt -[phone]

## 2013-12-10 NOTE — ED Notes (Signed)
Discussed plan of care with [place], [place]. Patient will be discharged.

## 2013-12-10 NOTE — ED Provider Notes (Signed)
Elizabeth Mcconnell is a 68 y.o. female who presents to Urgent Care today for abdominal pain. Patient had a colonoscopy and polyp removed 3 weeks ago. Since then she's had chronic constipation with mild intermittent cramping type abdominal pain.  [name] been treated with laxatives and with dicyclomine. She was doing reasonably well until yesterday evening when she noted acute onset of moderate to severe left lower quadrant abdominal pain. The pain has worsened this morning when the pain was so severe that she, "was in tears". The pain has eased off just a tiny bit today but she is still quite uncomfortable.  She denies any urinary frequency urgency or dysuria.  The colonoscopy showed diverticula. Past Medical History  Diagnosis Date  . Tobacco abuse   . Allergic rhinitis   . Oral candidiasis   . Atrial flutter     failed cardioversion 11/2012; s/p ablation 02-02-2013 by Dr Rayann Heman  . Asthma   . Pneumothorax on left 1996    left lower lung collapse s/p resection  . Arthritis   . Osteopenia 08/2013     T score -2.3 FRAX 17%/3.0% stable from prior DEXA  . ALLERGIC RHINITIS   . ANXIETY   . Endometriosis   . Adenomatous polyp of colon 10/2013    f/u colo 10/2018   History  Substance Use Topics  . Smoking status: Former Smoker -- 0.50 packs/day for 20 years    Quit date: 03/04/2004  . Smokeless tobacco: Never Used  . Alcohol Use: 7.0 oz/week    14 drink(s) per week     Comment: social   ROS as above Medications: No current facility-administered medications for this encounter.   Current Outpatient Prescriptions  Medication Sig Dispense Refill  . acetaminophen (TYLENOL) 500 MG tablet Take 500 mg by mouth every 6 (six) hours as needed for pain.      [name] ALPRAZolam (XANAX) 0.25 MG tablet Take 1 tablet (0.25 mg total) by mouth 2 (two) times daily as needed for anxiety or sleep.  30 tablet  2  . aspirin 81 MG tablet Take 81 mg by mouth daily.      . Calcium Carbonate-Vitamin D (CALCIUM-VITAMIN  D) 500-200 MG-UNIT per tablet Take 1 tablet by mouth daily.        . Cholecalciferol (VITAMIN D-3) 5000 UNITS TABS Take 1,000 Units by mouth daily.       [name] dicyclomine (BENTYL) 10 MG capsule Take 1 capsule (10 mg total) by mouth 3 (three) times daily as needed for spasms.  60 capsule  0  . estradiol (VIVELLE-DOT) 0.075 MG/24HR Place 1 patch onto the skin 2 (two) times a week.  8 patch  11  . fluticasone (FLONASE) 50 MCG/ACT nasal spray Place 2 sprays into both nostrils 2 (two) times daily.  16 g  6  . levalbuterol (XOPENEX HFA) 45 MCG/ACT inhaler Inhale 1-2 puffs into the lungs every 4 (four) hours as needed for shortness of breath.      . Melatonin 3 MG TABS Take 1 tablet by mouth as needed.       . meloxicam (MOBIC) 15 MG tablet Take 1 tablet (15 mg total) by mouth daily.  30 tablet  1  . mometasone-formoterol (DULERA) 200-5 MCG/ACT AERO Inhale 2 puffs into the lungs 2 (two) times daily.  13 g  6  . montelukast (SINGULAIR) 10 MG tablet TAKE ONE TABLET BY MOUTH EVERY DAY  30 tablet  6  . Probiotic Product (EQ PROBIOTIC) CAPS Take 1 capsule by  mouth daily.      . sennosides-docusate sodium (SENOKOT-S) 8.6-50 MG tablet Take 2 tablets by mouth daily.      Penne Lash HFA 45 MCG/ACT inhaler USE ONE TO TWO PUFFS EVERY 4 HOURS AS NEEDED  15 g  5    Exam:  BP 171/92  Pulse 99  Temp(Src) 98.5 F (36.9 C) (Oral)  Resp 16  SpO2 96% Gen: Well NAD HEENT: EOMI,  MMM Lungs: Normal work of breathing. CTABL Heart: RRR no MRG Abd: NABS, Soft. Nondistended, tender to palpation left lower corner and with minimal guarding. No rebound Exts: Brisk capillary refill, warm and well perfused.   Results for orders placed during the hospital encounter of 12/10/13 (from the past 24 hour(s))  POCT URINALYSIS DIP (DEVICE)     Status: None   Collection Time    12/10/13  3:34 PM      Result Value Ref Range   Glucose, UA NEGATIVE  NEGATIVE mg/dL   Bilirubin Urine NEGATIVE  NEGATIVE   Ketones, ur NEGATIVE  NEGATIVE  mg/dL   Specific Gravity, Urine 1.010  1.005 - 1.030   Hgb urine dipstick NEGATIVE  NEGATIVE   pH 7.0  5.0 - 8.0   Protein, ur NEGATIVE  NEGATIVE mg/dL   Urobilinogen, UA 0.2  0.0 - 1.0 mg/dL   Nitrite NEGATIVE  NEGATIVE   Leukocytes, UA NEGATIVE  NEGATIVE   No results found.  Assessment and Plan: 68 y.o. female with left lower quadrant abdominal. Patient has had 3 weeks of moderate abdominal complaints but new acute onset of left lower quadrant pain. This is in the setting of her recent colonoscopy with polyp removal and with diverticula. I'm concerned about diverticulitis or microperforation. Recommend patient presented to the emergency department for further evaluation and management of this issue. Her husband will drive her now.  Discussed warning signs or symptoms. Please see discharge instructions. Patient expresses understanding.     Gregor Hams, MD 12/10/13 [phone]

## 2013-12-10 NOTE — ED Notes (Signed)
Colonoscopy oct 5 th.  She has not had a bm since the procedure.  Worse today .  She has noit had a normal bm in one month.  Lower back and lower abd pain .  [facility] here

## 2013-12-10 NOTE — ED Notes (Signed)
Chris, pa at the bedside.

## 2013-12-10 NOTE — ED Notes (Signed)
C/o persistent abd pain onset 3 weeks Seen by PCP last week Reports sx began after colonoscopy procedure Also reports urinary freq Denies f/v/n/d Alert, no signs of acute distress.

## 2013-12-10 NOTE — ED Notes (Signed)
Offered pain medication, but patient is hesitant to receive at this time.

## 2013-12-10 NOTE — ED Provider Notes (Signed)
CSN: [id number]     Arrival date & time 12/10/13  1614 History   First MD Initiated Contact with Patient 12/10/13 1910     Chief Complaint  Patient presents with  . Abdominal Pain     (Consider location/radiation/quality/duration/timing/severity/associated sxs/prior Treatment) HPI Patient presents to the emergency department with abdominal pain, has been ongoing over the last month.  The patient, states she had a colonoscopy one month ago, and since that time.  Has had constipation, with increasing abdominal pain.  The patient, states, that nothing seems to make her condition, better.  Patient, states, that she has intermittent worsening in her symptoms.  Patient has seen her primary care doctor, who prescribed an antispasmodic, and this gave her some mild relief but today.  Her symptoms worsen, and that's what brought her to the emergency department.  The patient, states, that she didn't take any other medications prior to arrival.  The patient, states, that she has had small bowel movements, but not in normal fashion.  The patient, states, that she does not have any chest pain, shortness of breath, vomiting, diarrhea, weakness, dizziness, headache, blurred vision, rash, fever, or syncope, back pain, dysuria, hematemesis, bloody stool, hematuria, or anorexia.  The patient, states, that she has not followed up with her GI Dr. Past Medical History  Diagnosis Date  . Tobacco abuse   . Allergic rhinitis   . Oral candidiasis   . Atrial flutter     failed cardioversion 11/2012; s/p ablation 02-02-2013 by Dr Rayann Heman  . Asthma   . Pneumothorax on left 1996    left lower lung collapse s/p resection  . Arthritis   . Osteopenia 08/2013     T score -2.3 FRAX 17%/3.0% stable from prior DEXA  . ALLERGIC RHINITIS   . ANXIETY   . Endometriosis   . Adenomatous polyp of colon 10/2013    f/u colo 10/2018   Past Surgical History  Procedure Laterality Date  . Lung surgery  02/1965  . Cesarean section       X 2  . Appendectomy    . Total abdominal hysterectomy w/ bilateral salpingoophorectomy  1984    Endometriosis  . Cardioversion N/A 11/27/2012    Procedure: CARDIOVERSION;  Surgeon: Thayer Headings, MD;  Location[facility] ENDOSCOPY;  Service: Cardiovascular;  Laterality: N/A;  . Ablation  02-02-2013    CTI ablation by Dr Rayann Heman   . Abdominal hysterectomy     Family History  Problem Relation Age of Onset  . Stroke Father 60  . Hypertension Father   . Heart disease Father   . Diabetes Mother   . Colon cancer Neg Hx   . Rectal cancer Neg Hx   . Stomach cancer Neg Hx    History  Substance Use Topics  . Smoking status: Former Smoker -- 0.50 packs/day for 20 years    Quit date: 03/04/2004  . Smokeless tobacco: Never Used  . Alcohol Use: 7.0 oz/week    14 drink(s) per week     Comment: social   OB History   Grav Para Term Preterm Abortions TAB SAB Ect Mult Living   2 2 2       2      Review of Systems All other systems negative except as documented in the HPI. All pertinent positives and negatives as reviewed in the HPI.   Allergies  Tramadol; Amoxicillin-pot clavulanate; Flecainide; and Moxifloxacin  Home Medications   Prior to Admission medications   Medication Sig Start Date  End Date Taking? Authorizing Provider  acetaminophen (TYLENOL) 500 MG tablet Take 500 mg by mouth every 6 (six) hours as needed for pain.   Yes Historical Provider, MD  ALPRAZolam (XANAX) 0.25 MG tablet Take 1 tablet (0.25 mg total) by mouth 2 (two) times daily as needed for anxiety or sleep. 06/02/13  Yes Rowe Clack, MD  aspirin 81 MG tablet Take 81 mg by mouth daily.   Yes Historical Provider, MD  Calcium Carbonate-Vitamin D (CALCIUM-VITAMIN D) 500-200 MG-UNIT per tablet Take 1 tablet by mouth daily.     Yes Historical Provider, MD  Cholecalciferol (VITAMIN D-3) 5000 UNITS TABS Take 1,000 Units by mouth daily.    Yes Historical Provider, MD  dicyclomine (BENTYL) 10 MG capsule Take 1 capsule (10 mg  total) by mouth 3 (three) times daily as needed for spasms. 12/02/13  Yes Rowe Clack, MD  docusate sodium (COLACE) 100 MG capsule Take 100 mg by mouth 2 (two) times daily as needed for mild constipation.   Yes Historical Provider, MD  estradiol (VIVELLE-DOT) 0.075 MG/24HR Place 1 patch onto the skin 2 (two) times a week. Wednesdays and Thursdays 07/30/13  Yes [name], MD  fluticasone (FLONASE) 50 MCG/ACT nasal spray Place 2 sprays into both nostrils 2 (two) times daily. 08/03/13  Yes Elsie Stain, MD  levalbuterol [facility] HFA) 45 MCG/ACT inhaler Inhale 1-2 puffs into the lungs every 4 (four) hours as needed for shortness of breath.   Yes Historical Provider, MD  Melatonin 3 MG TABS Take 1 tablet by mouth daily as needed. For sleep   Yes Historical Provider, MD  meloxicam (MOBIC) 15 MG tablet Take 15 mg by mouth daily. 11/03/13  Yes Richard Blenda Mounts, DPM  mometasone-formoterol (DULERA) 200-5 MCG/ACT AERO Inhale 2 puffs into the lungs 2 (two) times daily. 07/15/13  Yes Tammy S Parrett, NP  montelukast (SINGULAIR) 10 MG tablet Take 10 mg by mouth daily.   Yes Historical Provider, MD  Probiotic Product (EQ PROBIOTIC) CAPS Take 1 capsule by mouth daily. 12/02/13  Yes Rowe Clack, MD   BP 110/61  Pulse 81  Temp(Src) 98.5 F (36.9 C) (Oral)  Resp 18  SpO2 93% Physical Exam  Nursing note and vitals reviewed. Constitutional: She is oriented to person, place, and time. She appears well-developed and well-nourished. No distress.  HENT:  Head: Normocephalic and atraumatic.  Mouth/Throat: Oropharynx is clear and moist.  Eyes: Pupils are equal, round, and reactive to light.  Neck: Normal range of motion. Neck supple.  Cardiovascular: Normal rate, regular rhythm and normal heart sounds.   Pulmonary/Chest: Effort normal and breath sounds normal.  Abdominal: Soft. Bowel sounds are normal. She exhibits no distension. There is tenderness. There is no rebound and no guarding.   Musculoskeletal: She exhibits no edema.  Neurological: She is alert and oriented to person, place, and time.  Skin: Skin is warm and dry.  Psychiatric: She has a normal mood and affect. Her behavior is normal. Thought content normal.    ED Course  Procedures (including critical care time) Labs Review Labs Reviewed  CBC WITH DIFFERENTIAL - Abnormal; Notable for the following:    WBC 15.4 (*)    Neutrophils Relative % 83 (*)    Neutro Abs 12.7 (*)    Lymphocytes Relative 10 (*)    All other components within normal limits  COMPREHENSIVE METABOLIC PANEL - Abnormal; Notable for the following:    GFR calc non Af Amer 84 (*)  All other components within normal limits  LIPASE, BLOOD - Abnormal; Notable for the following:    Lipase 65 (*)    All other components within normal limits  URINALYSIS, ROUTINE W REFLEX MICROSCOPIC  I-STAT CG4 LACTIC ACID, ED    Imaging Review Ct Abdomen Pelvis W Contrast  12/10/2013   CLINICAL DATA:  Abdominal pain, constipation, lower abdominal pain starting last p.m.  EXAM: CT ABDOMEN AND PELVIS WITH CONTRAST  TECHNIQUE: Multidetector CT imaging of the abdomen and pelvis was performed using the standard protocol following bolus administration of intravenous contrast.  CONTRAST:  171mL OMNIPAQUE IOHEXOL 300 MG/ML  SOLN  COMPARISON:  None.  FINDINGS: Sagittal images of the spine shows mild degenerative changes lumbar spine.  Lung bases are unremarkable. Liver, pancreas, spleen and adrenal glands are unremarkable. No calcified gallstones are noted within gallbladder.  Kidneys are symmetrical in size and enhancement. No hydronephrosis or hydroureter. Atherosclerotic calcifications of abdominal aorta and iliac arteries. No aortic aneurysm. Moderate colonic stool. No pericecal inflammation. The terminal ileum is unremarkable.  Few diverticula are noted distal left colon. Multiple sigmoid colon diverticula are noted. In axial image 61 there is segmental thickening of  colonic wall and mild stranding of pericolonic fat. This is confirmed in coronal image 55. Findings are highly suspicious for segmental colitis or diverticulitis. No definite diverticular abscess is noted. No extraluminal air.  There is a small left inguinal hernia containing fat measures 2 cm without evidence of acute complication. The urinary bladder is unremarkable. The patient is status post hysterectomy.  No small bowel obstruction. No abdominal ascites or free abdominal air.  Delayed renal images shows bilateral renal symmetrical excretion. Bilateral visualized proximal ureter is unremarkable.  IMPRESSION: 1. Multiple sigmoid colon diverticula are noted. There is segmental thickening of proximal sigmoid colon wall in left pelvis and mild stranding of surrounding fat. Findings are highly suspicious for segmental colitis or diverticulitis. No diverticular abscess is noted. 2. No hydronephrosis or hydroureter. Bilateral renal symmetrical excretion. 3. No small bowel obstruction. 4. Status post hysterectomy.   Electronically Signed   By: Lahoma Crocker M.D.   On: 12/10/2013 21:14    Patient's have a possible colitis versus diverticulitis, but no abscess noted or perforation.  Patient will be referred back to her GI doctor given Bactrim and Flagyl, based on her allergy profile.  Told to return here for any worsening in her condition.  Advised the patient to continue taking her prescribed laxatives  Brent General, PA-C 12/11/13 [place], [place] 12/11/13 1536

## 2013-12-10 NOTE — ED Provider Notes (Signed)
Complaint of low abdominal pain for approximately a month. Cramping, waxes and wanes has been treated herself with antispasmodics, laxatives and stool softeners without relief. No fever. Admits to chills. No other associated symptoms. On exam alert nontoxic not ill appearing pain is mild at present plan  Orlie Dakin, MD 12/10/13 2226

## 2013-12-10 NOTE — ED Notes (Signed)
Ct was called, patient finished drinking contrast.

## 2013-12-10 NOTE — Discharge Instructions (Signed)
Return here as needed.  Followup with your GI doctor and primary care

## 2013-12-10 NOTE — ED Notes (Signed)
Verbal order for 1L bolus at this time.

## 2013-12-10 NOTE — ED Notes (Signed)
Pt declined shuttle service; left by PV

## 2013-12-11 NOTE — ED Provider Notes (Signed)
Medical screening examination/treatment/procedure(s) were conducted as a shared visit with non-physician practitioner(s) and myself.  I personally evaluated the patient during the encounter.   EKG Interpretation None       Orlie Dakin, MD 12/11/13 1546

## 2013-12-13 ENCOUNTER — Telehealth: Payer: Self-pay

## 2013-12-13 NOTE — Telephone Encounter (Signed)
Spoke with pt about MD response.   I asked pt to call us tomorrow if she has not had a bowel movement.

## 2013-12-13 NOTE — Telephone Encounter (Signed)
Spoke with pt regarding MD response.   Pt stated that she has not had a bowel movement for 3 days.  Instructed pt to increase fluids and to call us tomorrow afternoon.   Pt would like to know if the is any response from GI after MD got with them.

## 2013-12-13 NOTE — Telephone Encounter (Signed)
I have reviewed the emergency room evaluation and recommendations I agree with continuing antibiotics as prescribed I will ask someone in GI department to arrange followup, but with different provider per patient request thanks

## 2013-12-13 NOTE — Telephone Encounter (Signed)
Patient is expecting your Phone call.

## 2013-12-13 NOTE — Telephone Encounter (Signed)
Intestinal and stomach pain is getting worse.   Pt would like MD to review the ED notes? (CT scan and other tests).   No BM since Friday morning. ED has her on 2 abx that is making her sick to her stomach, nauseous  and shaky. Flagyl 500mg  BID and Sulfamethoxazole - Trimethoprim 500 - 160mg  QID.

## 2013-12-14 ENCOUNTER — Telehealth: Payer: Self-pay | Admitting: Internal Medicine

## 2013-12-14 ENCOUNTER — Ambulatory Visit (INDEPENDENT_AMBULATORY_CARE_PROVIDER_SITE_OTHER): Payer: Medicare PPO | Admitting: Physician Assistant

## 2013-12-14 ENCOUNTER — Encounter: Payer: Self-pay | Admitting: Physician Assistant

## 2013-12-14 VITALS — BP 116/70 | HR 80 | Ht 63.5 in | Wt 144.4 lb

## 2013-12-14 DIAGNOSIS — K5732 Diverticulitis of large intestine without perforation or abscess without bleeding: Secondary | ICD-10-CM

## 2013-12-14 DIAGNOSIS — K5730 Diverticulosis of large intestine without perforation or abscess without bleeding: Secondary | ICD-10-CM | POA: Insufficient documentation

## 2013-12-14 DIAGNOSIS — K5900 Constipation, unspecified: Secondary | ICD-10-CM

## 2013-12-14 MED ORDER — POLYETHYLENE GLYCOL 3350 17 GM/SCOOP PO POWD: 17.0000 g | Freq: Every day | ORAL | Status: DC

## 2013-12-14 MED ORDER — ONDANSETRON HCL 4 MG PO TABS: ORAL_TABLET | ORAL | Status: DC

## 2013-12-14 NOTE — Patient Instructions (Signed)
We sent a prescription to [facility], [address], [place]. 1. Zofran ( ondansetron) 4 mg. Stop the Metronidazole. Continue the Septra DS, decrease the dosage to 1 tab twice daily. Take for a total of 14 days, Continue the probiotic daily.  Take Miralax, 1 dose ( 17 grams in 8 oz of liquid ) take 4-5 days until bowels are moving regularly. Call back in 48 hours with a progress report,.  Call us if symptoms haven't resolved after completing the antibiotic.

## 2013-12-14 NOTE — Telephone Encounter (Signed)
Miralax OTC qd-bid for BM i have put in order for GI f/u appt with any provider other than Brodie - you can check with [facility] on status of same

## 2013-12-14 NOTE — Telephone Encounter (Signed)
Spoke with patient and scheduled her today at 2:00 Pm with Nicoletta Ba, PA.

## 2013-12-14 NOTE — Progress Notes (Signed)
Subjective:    Patient ID: Elizabeth Mcconnell, female    DOB: 01/12/1946, 68 y.o.   MRN: [id number]  HPI Elizabeth Mcconnell is a 68 year old white female known to Dr. Sydell Axon will and and and and a will Cecille Rubin ML yesterday he underwent a and and and and and slowly I don't know why and a low-fat swelling or and sleepy but I and is more Brodie who underwent colonoscopy on 10/27/2013 for followup and was found to have one pedunculated polyp 37 mm in size and severe left colon diverticulosis. Path on the polyp consistent with a tubular adenoma. Patient says that she was not having any problems with her bowels prior to the colonoscopy but became constipated after the colonoscopy. She calls here couple of weeks after the procedure and was asked to try Senokot and a probiotic. Says this wasn't very helpful and she switched Peri-Colace which seems to help a bit. She then developed lower abdominal pain and was seen by her PCP at the beginning of October. At that time she was given a trial of anti-spasmodic. At the beginning of last week she was feeling a little bit better and then gradually has been weak when on developed left lower quadrant pain to the point of having severe pain and cramping with pain down into her left leg and into her back. She went to the emergency room on Friday, 12/10/2013 and was diagnosed with diverticulitis. She had CT of the abdomen and pelvis done which showed multiple sigmoid colon diverticuli and a segment of thickening of the proximal sigmoid colon wall in the left pelvis and mild stranding in the surrounding fat consistent with diverticulitis no abscess no hydronephrosis and no small bowel obstruction noted. She is allergic to penicillin and Avelox and therefore was started on metronidazole and Septra DS. She comes in today still feeling miserable nauseated and says she's unable to eat though her abdominal pain is better. She says she can't continue to live like this. He has not had any fever or  chills. She is now having small bowel movements and actually had a bowel movement last night though still feels constipated. No melena. She says her abdominal pain is significantly better and when asked her rate from a 1-10 she says probably a 2 compared to a 10  When  went to the emergency room. She feels the antibiotics are making her nauseated she has a bad taste in her mouth feels lightheaded off and on. She is actually taking to Septra DS twice a day and Flagyl 500 twice a day. She states first episode of diverticulitis.    Review of Systems  Constitutional: Positive for activity change, appetite change and fatigue.  HENT: Negative.   Eyes: Negative.   Respiratory: Negative.   Cardiovascular: Negative.   Gastrointestinal: Positive for nausea, abdominal pain and constipation.  Endocrine: Negative.   Genitourinary: Negative.   Musculoskeletal: Negative.   Allergic/Immunologic: Negative.   Neurological: Negative.   Hematological: Negative.   Psychiatric/Behavioral: Negative.    Outpatient Prescriptions Prior to Visit  Medication Sig Dispense Refill  . acetaminophen (TYLENOL) 500 MG tablet Take 500 mg by mouth every 6 (six) hours as needed for pain.      [name] ALPRAZolam (XANAX) 0.25 MG tablet Take 1 tablet (0.25 mg total) by mouth 2 (two) times daily as needed for anxiety or sleep.  30 tablet  2  . aspirin 81 MG tablet Take 81 mg by mouth daily.      . Calcium  Carbonate-Vitamin D (CALCIUM-VITAMIN D) 500-200 MG-UNIT per tablet Take 1 tablet by mouth daily.        . Cholecalciferol (VITAMIN D-3) 5000 UNITS TABS Take 1,000 Units by mouth daily.       [name] estradiol (VIVELLE-DOT) 0.075 MG/24HR Place 1 patch onto the skin 2 (two) times a week. Wednesdays and Thursdays      . fluticasone (FLONASE) 50 MCG/ACT nasal spray Place 2 sprays into both nostrils 2 (two) times daily.  16 g  6  . HYDROcodone-acetaminophen (NORCO/VICODIN) 5-325 MG per tablet Take 1 tablet by mouth every 6 (six) hours as needed  for moderate pain.  15 tablet  0  . levalbuterol (XOPENEX HFA) 45 MCG/ACT inhaler Inhale 1-2 puffs into the lungs every 4 (four) hours as needed for shortness of breath.      . metroNIDAZOLE (FLAGYL) 500 MG tablet Take 1 tablet (500 mg total) by mouth 2 (two) times daily.  20 tablet  0  . mometasone-formoterol (DULERA) 200-5 MCG/ACT AERO Inhale 2 puffs into the lungs 2 (two) times daily.  13 g  6  . montelukast (SINGULAIR) 10 MG tablet Take 10 mg by mouth daily.      . Probiotic Product (EQ PROBIOTIC) CAPS Take 1 capsule by mouth daily.      [name] sulfamethoxazole-trimethoprim (SEPTRA DS) 800-160 MG per tablet Take 2 tablets by mouth 2 (two) times daily.  40 tablet  0  . Melatonin 3 MG TABS Take 1 tablet by mouth daily as needed. For sleep      . polyethylene glycol powder (GLYCOLAX/MIRALAX) powder Take 17 g by mouth daily.  3350 g  1  . dicyclomine (BENTYL) 10 MG capsule Take 1 capsule (10 mg total) by mouth 3 (three) times daily as needed for spasms.  60 capsule  0  . docusate sodium (COLACE) 100 MG capsule Take 100 mg by mouth 2 (two) times daily as needed for mild constipation.      . meloxicam (MOBIC) 15 MG tablet Take 15 mg by mouth daily.       No facility-administered medications prior to visit.   Allergies  Allergen Reactions  . Tramadol Other (See Comments)    Severe dizziness, weakness  . Amoxicillin-Pot Clavulanate Nausea And Vomiting  . Flecainide     dizziness  . Avelox [Moxifloxacin] Hives, Itching and Rash   Patient Active Problem List   Diagnosis Date Noted  . Diverticulosis of colon without hemorrhage 12/14/2013  . BPPV (benign paroxysmal positional vertigo) 06/02/2013  . Left sided sciatica 04/22/2013  . Neck pain, chronic 04/22/2013  . Atrial flutter 01/23/2013  . Arthritis   . ANXIETY 08/08/2008  . ALLERGIC RHINITIS 08/08/2008  . Moderate persistent asthma with significant atopic features 01/15/2007   History  Substance Use Topics  . Smoking status: Former  Smoker -- 0.50 packs/day for 20 years    Quit date: 03/04/2004  . Smokeless tobacco: Never Used  . Alcohol Use: 7.0 oz/week    14 drink(s) per week     Comment: social   family history includes Diabetes in her mother; Heart disease in her father; Hypertension in her father; Stroke (age of onset: 70) in her father. There is no history of Colon cancer, Rectal cancer, or Stomach cancer.     Objective:   Physical Exam   Well-developed older white female in no acute distress blood pressure 116/70 pulse 80 height 5 foot 3 weight 144. HEENT; nontraumatic normocephalic EOMI PERRLA sclera anicteric, Supple; no JVD, Cardiovascular;  regular rate and rhythm with S1-S2 no murmur or gallop, Pulmonary; clear bilaterally, Abdomen; soft she is minimally tender in the left lower quadrant there is no guarding or rebound no palpable mass or hepatosplenomegaly bowel sounds are present, Rectal ;exam not done, Extremities; no clubbing cyanosis or edema skin warm dry, Psych; mood and affect appropriate       Assessment & Plan:  #72 68 year old female with severe left-sided diverticulosis now with onset of patient and followed by diverticulitis. Symptoms started post colonoscopy and it is possible that colonoscopy precipitated An episode of diverticulitis. She is improving on antibiotics though having side effects of nausea and metallic taste as well as poor appetite. This is more likely due to metronidazole though could be seen with Septra as well. #2 constipation secondary to above #3 history of atrial flutter #4 anxiety #5 adenomatous colon polyp  Plan; will stop metronidazole  Continue Septra DS but decreased to one tablet twice daily and complete a 14 day course Add probiotic in the form of floor store or align Zofran 4 mg every 6 hours when necessary for nausea Patient encouraged to push oral fluids and soft bland diet Add MiraLax 17 g in 6-8 ounces of fluid daily over the next week until bowels moving  more regularly then use when necessary She will call back in 48 hours if symptoms are not improving and also knows to call back if she has  any residual lower abdominal pain when she finishes her antibiotics.

## 2013-12-14 NOTE — Telephone Encounter (Signed)
Spoke to pt and she has an appointment to see GI today at 2pm.   She is still feeling really bad: shaky, cranky and tired; but did have a small amount of bowel movement. She has not taken the Flagyl today.

## 2013-12-15 NOTE — Progress Notes (Signed)
Reviewed and agree.  Has severe diverticllosis, possible -itis.Agree with bowl rest reassess in  Couple of weeks.

## 2013-12-16 ENCOUNTER — Telehealth: Payer: Self-pay | Admitting: Physician Assistant

## 2013-12-16 NOTE — Telephone Encounter (Signed)
FYI-not 100% yet, but states she is doing better. No nausea or pain. Not much appetite yet. She feels encouraged. She will let us know if she does not continue to improve or if she acutely worsens.

## 2013-12-17 NOTE — Telephone Encounter (Signed)
Excellent

## 2013-12-27 ENCOUNTER — Ambulatory Visit (INDEPENDENT_AMBULATORY_CARE_PROVIDER_SITE_OTHER): Payer: Medicare PPO | Admitting: Critical Care Medicine

## 2013-12-27 ENCOUNTER — Encounter: Payer: Self-pay | Admitting: Critical Care Medicine

## 2013-12-27 VITALS — BP 120/80 | HR 72 | Ht 65.0 in | Wt 149.0 lb

## 2013-12-27 DIAGNOSIS — J4540 Moderate persistent asthma, uncomplicated: Secondary | ICD-10-CM

## 2013-12-27 MED ORDER — FLUTICASONE PROPIONATE 50 MCG/ACT NA SUSP: 2.0000 | Freq: Two times a day (BID) | NASAL | Status: DC

## 2013-12-27 MED ORDER — MOMETASONE FURO-FORMOTEROL FUM 200-5 MCG/ACT IN AERO: 2.0000 | INHALATION_SPRAY | Freq: Two times a day (BID) | RESPIRATORY_TRACT | Status: DC

## 2013-12-27 NOTE — Patient Instructions (Addendum)
No change in medications. Return in         4 months  Get a flu vaccine in about 7-10days

## 2013-12-27 NOTE — Progress Notes (Signed)
Subjective:    Patient ID: Elizabeth Mcconnell, female    DOB: [birth year], 68 y.o.   MRN: [id number]  HPI68 y.o.F  with known hx of  Severe atopic asthma with extremely high IgE  levels, paroxysmal atrial fibrillation felt to be secondary to albuterol, allergic rhinitis,  Former smoker .  Prior history of recurrent spontaneous pneumothorax, with left lower lobe lobectomy.   12/27/2013 Chief Complaint  Patient presents with  . Asthma    Patient here to follow-up no complaints. Asthma doing well.  No problems with dulera . Pt had a colonoscopy and infection in colon and rx abx. Just completed  CT scan showed sigmoid inflammation.  Once in the system this improved.   Rx flagyl/septra.     PUL ASTHMA HISTORY 12/27/2013 09/01/2013 07/22/2012 02/19/2012 10/22/2011  Symptoms 0-2 days/week >2 days/week 0-2 days/week 0-2 days/week 0-2 days/week  Nighttime awakenings 0-2/month 0-2/month 0-2/month 0-2/month 0-2/month  Interference with activity No limitations No limitations No limitations No limitations No limitations  SABA use 0-2 days/wk > 2 days/wk--not > 1 x/day 0-2 days/wk 0-2 days/wk 0-2 days/wk  Exacerbations requiring oral steroids 0-1 / year 2 or more / year 0-1 / year 0-1 / year 0-1 / year    Review of Systems Constitutional:   No  weight loss, night sweats,  Fevers, chills, fatigue, or  lassitude.  HEENT:   No headaches,  Difficulty swallowing,  Tooth/dental problems, or  Sore throat,                No sneezing, itching, ear ache,  No nasal congestion, no post nasal drip,   CV:  No chest pain,  Orthopnea, PND, swelling in lower extremities, anasarca, dizziness, palpitations, syncope.   GI  No heartburn, indigestion, abdominal pain, nausea, vomiting, diarrhea, change in bowel habits, loss of appetite, bloody stools.   Resp:   No coughing up of blood.  No change in color of mucus.  No wheezing.  No chest wall deformity  Skin: no rash or lesions.  GU: no dysuria, change in color  of urine, no urgency or frequency.  No flank pain, no hematuria   MS:  No joint pain or swelling.  No decreased range of motion.  No back pain.  Psych:  No change in mood or affect. No depression or anxiety.  No memory loss.     Objective:   Physical Exam BP 120/80  Pulse 72  Ht 5\' 5"  (1.651 m)  Wt 149 lb (67.586 kg)  BMI 24.79 kg/m2  SpO2 98%   GEN: A/Ox3; pleasant , NAD, well nourished   HEENT:  [place]/AT,  EACs-clear, TMs-wnl, NOSE-clear, THROAT-clear, no lesions, no postnasal drip  Few scattered white patches in post pharynx- c/w thrush  NECK:  Supple w/ fair ROM; no JVD; normal carotid impulses w/o bruits; no thyromegaly or nodules palpated; no lymphadenopathy.  RESP  Clear  P & A; w/o, wheezes/ rales/ or rhonchi.no accessory muscle use, no dullness to percussion  CARD:  RRR, no m/r/g  , no peripheral edema, pulses intact, no cyanosis or clubbing.  GI:   Soft & nt; nml bowel sounds; no organomegaly or masses detected.  Musco: Warm bil, no deformities or joint swelling noted.   Neuro: alert, no focal deficits noted.    Skin: Warm, no lesions or rashes     Assessment & Plan:   Asthma, moderate persistent Moderate persistent asthma stable at present Plan Maintain inhaled meds as Rx Obtain flu vaccine in 7days  after GI issues resolve    Updated Medication List Outpatient Encounter Prescriptions as of 12/27/2013  Medication Sig  . acetaminophen (TYLENOL) 500 MG tablet Take 500 mg by mouth every 6 (six) hours as needed for pain.  [name] ALPRAZolam (XANAX) 0.25 MG tablet Take 1 tablet (0.25 mg total) by mouth 2 (two) times daily as needed for anxiety or sleep.  [name] aspirin 81 MG tablet Take 81 mg by mouth daily.  . Calcium Carbonate-Vitamin D (CALCIUM-VITAMIN D) 500-200 MG-UNIT per tablet Take 1 tablet by mouth daily.    . Cholecalciferol (VITAMIN D-3) 5000 UNITS TABS Take 2,000 Units by mouth daily.   [name] estradiol (VIVELLE-DOT) 0.075 MG/24HR Place 1 patch onto the skin 2  (two) times a week. Wednesdays and Thursdays  . fluticasone (FLONASE) 50 MCG/ACT nasal spray Place 2 sprays into both nostrils 2 (two) times daily.  [name] HYDROcodone-acetaminophen (NORCO/VICODIN) 5-325 MG per tablet Take 1 tablet by mouth every 6 (six) hours as needed for moderate pain.  [name] levalbuterol (XOPENEX HFA) 45 MCG/ACT inhaler Inhale 1-2 puffs into the lungs every 4 (four) hours as needed for shortness of breath.  . Melatonin 3 MG TABS Take 1 tablet by mouth daily as needed. For sleep  . mometasone-formoterol (DULERA) 200-5 MCG/ACT AERO Inhale 2 puffs into the lungs 2 (two) times daily.  . ondansetron (ZOFRAN) 4 MG tablet Take 1 tab every 6 hours as needed for nausea.  . [DISCONTINUED] fluticasone (FLONASE) 50 MCG/ACT nasal spray Place 2 sprays into both nostrils 2 (two) times daily.  . [DISCONTINUED] mometasone-formoterol (DULERA) 200-5 MCG/ACT AERO Inhale 2 puffs into the lungs 2 (two) times daily.  . montelukast (SINGULAIR) 10 MG tablet Take 10 mg by mouth daily.  . [DISCONTINUED] metroNIDAZOLE (FLAGYL) 500 MG tablet Take 1 tablet (500 mg total) by mouth 2 (two) times daily.  . [DISCONTINUED] polyethylene glycol powder (GLYCOLAX/MIRALAX) powder Take 17 g by mouth daily.  . [DISCONTINUED] Probiotic Product (EQ PROBIOTIC) CAPS Take 1 capsule by mouth daily.  . [DISCONTINUED] sulfamethoxazole-trimethoprim (SEPTRA DS) 800-160 MG per tablet Take 2 tablets by mouth 2 (two) times daily.

## 2013-12-28 NOTE — Assessment & Plan Note (Signed)
Moderate persistent asthma stable at present Plan Maintain inhaled meds as Rx Obtain flu vaccine in 7days after GI issues resolve

## 2014-01-03 ENCOUNTER — Encounter: Payer: Self-pay | Admitting: Critical Care Medicine

## 2014-02-03 ENCOUNTER — Other Ambulatory Visit: Payer: Self-pay | Admitting: Critical Care Medicine

## 2014-02-10 ENCOUNTER — Encounter (HOSPITAL_COMMUNITY): Payer: Self-pay | Admitting: Internal Medicine

## 2014-02-24 ENCOUNTER — Ambulatory Visit: Payer: Medicare PPO | Admitting: Internal Medicine

## 2014-05-23 ENCOUNTER — Telehealth: Payer: Self-pay | Admitting: Cardiovascular Disease

## 2014-05-23 DIAGNOSIS — R002 Palpitations: Secondary | ICD-10-CM

## 2014-05-23 NOTE — Telephone Encounter (Signed)
New Message        Pt calling stating that she needs to see Dr. Burt Knack because she is having racing heart beat and pulse rate is very high. The first available with Dr. Burt Knack is in June, offered pt a sooner appt w/ a PA and she only wants to see Dr. Burt Knack. Please call back and advise.

## 2014-05-23 NOTE — Telephone Encounter (Signed)
I spoke with the pt's husband and the pt is not having palpitations on a daily basis. At this time we will order an event monitor.  I will contact the pt once event monitor is scheduled to set up follow-up with Dr Burt Knack.

## 2014-05-23 NOTE — Telephone Encounter (Signed)
I spoke with the pt and she has started to have episodes of her heart racing over the past 2-3 weeks.  The pt has a history of atrial flutter ablation with Dr Rayann Heman in 2014. When her heart races she checks her pulse by BP cuff and it is 120-130.  The pt has also noticed that her BP is all over the place. I made the pt that I will make Dr Burt Knack aware of her symptoms and then call her back.

## 2014-05-23 NOTE — Telephone Encounter (Signed)
If frequent/daily sx's would do a 24-48 Holter prior to visit. thx

## 2014-05-25 ENCOUNTER — Telehealth: Payer: Self-pay | Admitting: Internal Medicine

## 2014-05-25 NOTE — Telephone Encounter (Signed)
Patient had an appointment for 4/6 that has to be cancelled due to provider schedule.  She does not want to see another provider and she does not want to wait to get in to see Dr. Asa Lente later.  She did receive the letter. Patient states she feels she is being age discriminated against. She is requesting to be worked in as soon as possible.

## 2014-05-25 NOTE — Telephone Encounter (Signed)
The pt is scheduled for event monitor placement on 06/01/14.

## 2014-05-26 NOTE — Telephone Encounter (Signed)
Please assure her this is not any time of discrimination. I highly recommended any of my provider partners to be able to cover for me as pt needs, Dr Doug Sou if female doc is preferred I will look at my schedule to see where I may have space - or if cancellation arises - sorry I am not able to otherwise accommodate her preference

## 2014-05-30 ENCOUNTER — Encounter: Payer: Self-pay | Admitting: Critical Care Medicine

## 2014-05-30 ENCOUNTER — Ambulatory Visit (INDEPENDENT_AMBULATORY_CARE_PROVIDER_SITE_OTHER): Payer: Medicare PPO | Admitting: Critical Care Medicine

## 2014-05-30 VITALS — BP 140/100 | HR 73 | Temp 97.8°F | Ht 65.0 in | Wt 148.0 lb

## 2014-05-30 DIAGNOSIS — J4540 Moderate persistent asthma, uncomplicated: Secondary | ICD-10-CM | POA: Diagnosis not present

## 2014-05-30 DIAGNOSIS — R030 Elevated blood-pressure reading, without diagnosis of hypertension: Secondary | ICD-10-CM | POA: Diagnosis not present

## 2014-05-30 DIAGNOSIS — I10 Essential (primary) hypertension: Secondary | ICD-10-CM | POA: Insufficient documentation

## 2014-05-30 DIAGNOSIS — IMO0001 Reserved for inherently not codable concepts without codable children: Secondary | ICD-10-CM

## 2014-05-30 HISTORY — DX: Essential (primary) hypertension: I10

## 2014-05-30 MED ORDER — MOMETASONE FURO-FORMOTEROL FUM 200-5 MCG/ACT IN AERO: 2.0000 | INHALATION_SPRAY | Freq: Two times a day (BID) | RESPIRATORY_TRACT | Status: DC

## 2014-05-30 NOTE — Patient Instructions (Addendum)
No change in medications. Return in        4 months I will ask your PCP to address your BP issues

## 2014-05-30 NOTE — Progress Notes (Signed)
Subjective:    Patient ID: Elizabeth Mcconnell, female    DOB: 1945/05/25, 69 y.o.   MRN: [id number]  HPI68 y.o.F  with known hx of  Severe atopic asthma with extremely high IgE  levels, paroxysmal atrial fibrillation felt to be secondary to albuterol, allergic rhinitis,  Former smoker .  Prior history of recurrent spontaneous pneumothorax, with left lower lobe lobectomy.   05/30/2014 Chief Complaint  Patient presents with  . Follow-up    Pt states she has SOB, Wheezing and sneezing. Pt has productive cough in mornings with gray mucus.  Pt with some wheezing and cough. Worse if outdoors.  Cough is prod of gray mucus.  Notes some dyspnea on exertion.  No f/c/s.  No real qhs symptoms  Pt denies any significant sore throat, nasal congestion or excess secretions, fever, chills, sweats, unintended weight loss, pleurtic or exertional chest pain, orthopnea PND, or leg swelling Pt denies any increase in rescue therapy over baseline, denies waking up needing it or having any early am or nocturnal exacerbations of coughing/wheezing/or dyspnea. Pt also denies any obvious fluctuation in symptoms with  weather or environmental change or other alleviating or aggravating factors    PUL ASTHMA HISTORY 05/30/2014 12/27/2013 09/01/2013 07/22/2012 02/19/2012 10/22/2011 04/03/2011  Symptoms >2 days/week 0-2 days/week >2 days/week 0-2 days/week 0-2 days/week 0-2 days/week 0-2 days/week  Nighttime awakenings 0-2/month 0-2/month 0-2/month 0-2/month 0-2/month 0-2/month 0-2/month  Interference with activity No limitations No limitations No limitations No limitations No limitations No limitations No limitations  SABA use > 2 days/wk--not > 1 x/day 0-2 days/wk > 2 days/wk--not > 1 x/day 0-2 days/wk 0-2 days/wk 0-2 days/wk 0-2 days/wk  Exacerbations requiring oral steroids 0-1 / year 0-1 / year 2 or more / year 0-1 / year 0-1 / year 0-1 / year 0-1 / year    Review of Systems Constitutional:   No  weight loss, night  sweats,  Fevers, chills, fatigue, or  lassitude.  HEENT:   No headaches,  Difficulty swallowing,  Tooth/dental problems, or  Sore throat,                No sneezing, itching, ear ache,  No nasal congestion, no post nasal drip,   CV:  No chest pain,  Orthopnea, PND, swelling in lower extremities, anasarca, dizziness, palpitations, syncope.   GI  No heartburn, indigestion, abdominal pain, nausea, vomiting, diarrhea, change in bowel habits, loss of appetite, bloody stools.   Resp:   No coughing up of blood.  No change in color of mucus.  No wheezing.  No chest wall deformity  Skin: no rash or lesions.  GU: no dysuria, change in color of urine, no urgency or frequency.  No flank pain, no hematuria   MS:  No joint pain or swelling.  No decreased range of motion.  No back pain.  Psych:  No change in mood or affect. No depression or anxiety.  No memory loss.     Objective:   Physical ExamBP 140/100 mmHg  Pulse 73  Temp(Src) 97.8 F (36.6 C) (Oral)  Ht 5\' 5"  (1.651 m)  Wt 148 lb (67.132 kg)  BMI 24.63 kg/m2  SpO2 97%   GEN: A/Ox3; pleasant , NAD, well nourished   HEENT:  [place]/AT,  EACs-clear, TMs-wnl, NOSE-clear, THROAT-clear, no lesions, no postnasal drip  Few scattered white patches in post pharynx- c/w thrush  NECK:  Supple w/ fair ROM; no JVD; normal carotid impulses w/o bruits; no thyromegaly or nodules palpated; no lymphadenopathy.  RESP  Clear  P & A; w/o, wheezes/ rales/ or rhonchi.no accessory muscle use, no dullness to percussion  CARD:  RRR, no m/r/g  , no peripheral edema, pulses intact, no cyanosis or clubbing.  GI:   Soft & nt; nml bowel sounds; no organomegaly or masses detected.  Musco: Warm bil, no deformities or joint swelling noted.   Neuro: alert, no focal deficits noted.    Skin: Warm, no lesions or rashes     Assessment & Plan:   Asthma, moderate persistent Moderate persistent asthma with stable airway function  Plan No change in inhaled or  maintenance medications. Return in  4 months   Elevated BP Labile BP , assoc HR elevations. CARDs doing a Holter monitor.  The pt will need f/u on BP per pcp I messaged pts PCP dr Asa Lente on this patient      Updated Medication List Outpatient Encounter Prescriptions as of 05/30/2014  Medication Sig  . acetaminophen (TYLENOL) 650 MG CR tablet Take 650 mg by mouth at bedtime. Second dose during day if needed  . ALPRAZolam (XANAX) 0.25 MG tablet Take 1 tablet (0.25 mg total) by mouth 2 (two) times daily as needed for anxiety or sleep.  [name] aspirin 81 MG tablet Take 81 mg by mouth daily.  . Calcium Carbonate-Vitamin D (CALCIUM-VITAMIN D) 500-200 MG-UNIT per tablet Take 1 tablet by mouth daily.    [name] estradiol (VIVELLE-DOT) 0.075 MG/24HR Place 1 patch onto the skin 2 (two) times a week. Wednesdays and Thursdays  . fluticasone (FLONASE) 50 MCG/ACT nasal spray Place 2 sprays into both nostrils 2 (two) times daily.  [name] levalbuterol (XOPENEX HFA) 45 MCG/ACT inhaler Inhale 1-2 puffs into the lungs every 4 (four) hours as needed for shortness of breath.  . Melatonin 3 MG TABS Take 1 tablet by mouth daily as needed. For sleep  . mometasone-formoterol (DULERA) 200-5 MCG/ACT AERO Inhale 2 puffs into the lungs 2 (two) times daily.  . montelukast (SINGULAIR) 10 MG tablet TAKE ONE TABLET BY MOUTH EVERY DAY  . [DISCONTINUED] mometasone-formoterol (DULERA) 200-5 MCG/ACT AERO Inhale 2 puffs into the lungs 2 (two) times daily.  . [DISCONTINUED] montelukast (SINGULAIR) 10 MG tablet Take 10 mg by mouth daily.  . [DISCONTINUED] acetaminophen (TYLENOL) 500 MG tablet Take 500 mg by mouth every 6 (six) hours as needed for pain.  . [DISCONTINUED] Cholecalciferol (VITAMIN D-3) 5000 UNITS TABS Take 2,000 Units by mouth daily.   . [DISCONTINUED] HYDROcodone-acetaminophen (NORCO/VICODIN) 5-325 MG per tablet Take 1 tablet by mouth every 6 (six) hours as needed for moderate pain. (Patient not taking: Reported on 05/30/2014)    . [DISCONTINUED] ondansetron (ZOFRAN) 4 MG tablet Take 1 tab every 6 hours as needed for nausea.

## 2014-05-30 NOTE — Telephone Encounter (Signed)
Please let patient know Dr. Joya Gaskins has contacted [place] and let her know that we will work her in, but unfortunately my schedule does not allow same with me.  Please offer appointment with any of my partners for follow-up of blood pressure issues and other concerns as needed

## 2014-05-30 NOTE — Assessment & Plan Note (Signed)
Labile BP , assoc HR elevations. CARDs doing a Holter monitor.  The pt will need f/u on BP per pcp I messaged pts PCP dr Asa Lente on this patient

## 2014-05-30 NOTE — Assessment & Plan Note (Signed)
Moderate persistent asthma with stable airway function  Plan No change in inhaled or maintenance medications. Return in  4 months

## 2014-05-30 NOTE — Telephone Encounter (Signed)
Spoke to pt and have scheduled her for 06/06/14 at 8:45am.

## 2014-06-01 ENCOUNTER — Encounter: Payer: Self-pay | Admitting: *Deleted

## 2014-06-01 ENCOUNTER — Encounter (INDEPENDENT_AMBULATORY_CARE_PROVIDER_SITE_OTHER): Payer: Medicare PPO

## 2014-06-01 DIAGNOSIS — R002 Palpitations: Secondary | ICD-10-CM | POA: Diagnosis not present

## 2014-06-01 NOTE — Progress Notes (Signed)
Patient ID: Elizabeth Mcconnell, female   DOB: 1945-08-17, 70 y.o.   MRN: [id number] [employer] verite 30 day cardiac event monitor applied to patient.

## 2014-06-03 ENCOUNTER — Telehealth: Payer: Self-pay | Admitting: Cardiology

## 2014-06-03 LAB — HM MAMMOGRAPHY

## 2014-06-03 NOTE — Telephone Encounter (Signed)
cardizem CD 240 mg daily begin tonight , eliquis 5 mg BID will be started tonight.  Pt sounds weak and anxious but very upset with thoughts of ER.  I talked with both the pt and her husband and for now will begin these meds and if worsening symptoms she will go to ER.   I will ask office to see her on Monday if possible.  She has been on higher doses of Cardizem in the past but without examining her I wished to start lower.  She thought she was still in a fib.

## 2014-06-03 NOTE — Telephone Encounter (Signed)
Called by event monitor company.  Pt not feeling well today, lethargic,  BP elevated.  Then very dizzy at one point tonight.  Not on meds for rate or anticoagulation.  Will check with MD but will add dilt .

## 2014-06-06 ENCOUNTER — Telehealth: Payer: Self-pay | Admitting: *Deleted

## 2014-06-06 ENCOUNTER — Encounter (HOSPITAL_COMMUNITY): Payer: Self-pay | Admitting: Nurse Practitioner

## 2014-06-06 ENCOUNTER — Ambulatory Visit (HOSPITAL_COMMUNITY)
Admission: RE | Admit: 2014-06-06 | Discharge: 2014-06-06 | Disposition: A | Discharge status: Home / Self Care | Payer: Medicare PPO | Source: Ambulatory Visit | Attending: Nurse Practitioner | Admitting: Nurse Practitioner

## 2014-06-06 ENCOUNTER — Ambulatory Visit (INDEPENDENT_AMBULATORY_CARE_PROVIDER_SITE_OTHER): Payer: Medicare PPO | Admitting: Internal Medicine

## 2014-06-06 ENCOUNTER — Other Ambulatory Visit (INDEPENDENT_AMBULATORY_CARE_PROVIDER_SITE_OTHER): Payer: Medicare PPO

## 2014-06-06 ENCOUNTER — Encounter: Payer: Self-pay | Admitting: Internal Medicine

## 2014-06-06 ENCOUNTER — Encounter: Payer: Self-pay | Admitting: Gynecology

## 2014-06-06 ENCOUNTER — Other Ambulatory Visit: Payer: Self-pay

## 2014-06-06 VITALS — BP 122/74 | HR 74 | Ht 65.0 in | Wt 145.6 lb

## 2014-06-06 VITALS — BP 138/72 | HR 70 | Temp 97.5°F | Resp 18 | Wt 145.0 lb

## 2014-06-06 DIAGNOSIS — L723 Sebaceous cyst: Secondary | ICD-10-CM

## 2014-06-06 DIAGNOSIS — Z7902 Long term (current) use of antithrombotics/antiplatelets: Secondary | ICD-10-CM | POA: Diagnosis not present

## 2014-06-06 DIAGNOSIS — I4891 Unspecified atrial fibrillation: Secondary | ICD-10-CM | POA: Diagnosis not present

## 2014-06-06 DIAGNOSIS — R0683 Snoring: Secondary | ICD-10-CM | POA: Diagnosis not present

## 2014-06-06 DIAGNOSIS — Z823 Family history of stroke: Secondary | ICD-10-CM | POA: Insufficient documentation

## 2014-06-06 DIAGNOSIS — Z79899 Other long term (current) drug therapy: Secondary | ICD-10-CM | POA: Diagnosis not present

## 2014-06-06 DIAGNOSIS — I480 Paroxysmal atrial fibrillation: Secondary | ICD-10-CM | POA: Diagnosis not present

## 2014-06-06 DIAGNOSIS — Z87891 Personal history of nicotine dependence: Secondary | ICD-10-CM | POA: Diagnosis not present

## 2014-06-06 DIAGNOSIS — Z8249 Family history of ischemic heart disease and other diseases of the circulatory system: Secondary | ICD-10-CM | POA: Diagnosis not present

## 2014-06-06 DIAGNOSIS — J45909 Unspecified asthma, uncomplicated: Secondary | ICD-10-CM | POA: Diagnosis not present

## 2014-06-06 LAB — CBC WITH DIFFERENTIAL/PLATELET
Basophils Absolute: 0 10*3/uL (ref 0.0–0.1)
Basophils Relative: 0.2 % (ref 0.0–3.0)
Eosinophils Absolute: 0.2 10*3/uL (ref 0.0–0.7)
Eosinophils Relative: 2.7 % (ref 0.0–5.0)
HCT: 46.1 % — ABNORMAL HIGH (ref 36.0–46.0)
Hemoglobin: 15.3 g/dL — ABNORMAL HIGH (ref 12.0–15.0)
Lymphocytes Relative: 23.8 % (ref 12.0–46.0)
Lymphs Abs: 2.2 10*3/uL (ref 0.7–4.0)
MCHC: 33.2 g/dL (ref 30.0–36.0)
MCV: 88 fl (ref 78.0–100.0)
Monocytes Absolute: 0.5 10*3/uL (ref 0.1–1.0)
Monocytes Relative: 5.9 % (ref 3.0–12.0)
Neutro Abs: 6.2 10*3/uL (ref 1.4–7.7)
Neutrophils Relative %: 67.4 % (ref 43.0–77.0)
Platelets: 183 10*3/uL (ref 150.0–400.0)
RBC: 5.24 Mil/uL — ABNORMAL HIGH (ref 3.87–5.11)
RDW: 13.7 % (ref 11.5–15.5)
WBC: 9.3 10*3/uL (ref 4.0–10.5)

## 2014-06-06 LAB — BASIC METABOLIC PANEL
BUN: 15 mg/dL (ref 6–23)
CO2: 25 mEq/L (ref 19–32)
Calcium: 10.1 mg/dL (ref 8.4–10.5)
Chloride: 105 mEq/L (ref 96–112)
Creatinine, Ser: 0.72 mg/dL (ref 0.40–1.20)
GFR: 84.94 mL/min (ref >60.00)
Glucose, Bld: 85 mg/dL (ref 70–99)
Potassium: 4 mEq/L (ref 3.5–5.1)
Sodium: 138 mEq/L (ref 135–145)

## 2014-06-06 LAB — HEPATIC FUNCTION PANEL
ALT: 11 U/L (ref 0–35)
AST: 13 U/L (ref 0–37)
Albumin: 4.8 g/dL (ref 3.5–5.2)
Alkaline Phosphatase: 74 U/L (ref 39–117)
Bilirubin, Direct: 0.2 mg/dL (ref 0.0–0.3)
Total Bilirubin: 0.9 mg/dL (ref 0.2–1.2)
Total Protein: 7.8 g/dL (ref 6.0–8.3)

## 2014-06-06 LAB — TSH: TSH: 2.81 u[IU]/mL (ref 0.35–4.50)

## 2014-06-06 NOTE — Patient Instructions (Addendum)
It was good to see you today.  We have reviewed your prior records including labs and tests today  Test(s) ordered today. Your results will be released to [place] (or called to you) after review, usually within 72hours after test completion. If any changes need to be made, you will be notified at that same time.  Medications reviewed and updated, no changes recommended at this time.  followup with A fib clinic as planned later today - medications  Please schedule followup in 3-4 months for your annual wellness -or sooner with Dr Ronnald Ramp as your new primary care doc - call sooner if problems.

## 2014-06-06 NOTE — Progress Notes (Signed)
Patient ID: Elizabeth Mcconnell, female   DOB: 1945/05/26, 69 y.o.   MRN: [id number]  Primary Care Physician: [name], MD Cardiologist: Dr. Burt Knack Electrophysiologist: Dr. Ronny Bacon Elizabeth Mcconnell is a 69 y.o. female with a h/o past pneumothorax, tobacco abuse, severe atopic ashtma, allergic rhinitis and atrial flutter. Her atrial flutter was initially diagnosed in 2006 at the time of an acute asthma exacerbation She did well until September of this year when she was found to have recurrant atrial flutter. She was anticoagulated and placed on Flecainide with plans for DCCV, she converted to SR spontaneously before cardioversion. She then had recurrent flutter and underwent attempted cardioversion 11-27-2012 which was not successful. She was intolerant of Flecainide with dizziness and nausea. She then underwent CTI ablation for a.flutter 02/03/15.  She recently noticed episodes of rapid heart rate,with v rates up to 120-150, often associated with elevated blood pressure. She contacted Dr. Antionette Char nurse, and event monitor was placed. She received a call on Friday the monitor showed afib with rvr. Cecilie Kicks, NP, called in Cardizem 240 mg daily and Eliquis 5 mg bid for a chadsvasc of 2(AGE/FEMALE).  Since starting Cardizem, she reports no further episodes of afib and BP has improved. She does give a history of snoring but denies apnea. Continues to smoke but has cut back. Drinks 3 glasses of red wine nightly. Does water aerobics 2x week. One cup of coffee a week. Was seen by PCP this am and tsh, bmet normal and cbc showed elevated RBC, HGB,HCT.  Today, she denies symptoms of palpitations, chest pain, shortness of breath, orthopnea, PND, lower extremity edema, dizziness, presyncope, syncope, or neurologic sequela. The patient is tolerating medications without difficulties and is otherwise without complaint today.   Past Medical History  Diagnosis Date  . Tobacco abuse   . Allergic  rhinitis   . Oral candidiasis   . Atrial flutter     failed cardioversion 11/2012; s/p ablation 02-02-2013 by Dr Rayann Heman  . Asthma   . Pneumothorax on left 1996    left lower lung collapse s/p resection  . Arthritis   . Osteopenia 08/2013     T score -2.3 FRAX 17%/3.0% stable from prior DEXA  . ALLERGIC RHINITIS   . ANXIETY   . Endometriosis   . Adenomatous polyp of colon 10/2013    f/u colo 10/2018  . Atrial fibrillation with RVR     on Holter 06/03/14   Past Surgical History  Procedure Laterality Date  . Lung surgery  02/1965  . Cesarean section      X 2  . Appendectomy    . Total abdominal hysterectomy w/ bilateral salpingoophorectomy  1984    Endometriosis  . Cardioversion N/A 11/27/2012    Procedure: CARDIOVERSION;  Surgeon: Thayer Headings, MD;  Location: [facility] ENDOSCOPY;  Service: Cardiovascular;  Laterality: N/A;  . Ablation  02-02-2013    CTI ablation by Dr Rayann Heman   . Abdominal hysterectomy    . Atrial flutter ablation N/A 02/02/2013    Procedure: ATRIAL FLUTTER ABLATION;  Surgeon: Coralyn Mark, MD;  Location: [place] [facility] CATH LAB;  Service: Cardiovascular;  Laterality: N/A;    Current Outpatient Prescriptions  Medication Sig Dispense Refill  . acetaminophen (TYLENOL) 650 MG CR tablet Take 650 mg by mouth at bedtime. Second dose during day if needed    . ALPRAZolam (XANAX) 0.25 MG tablet Take 1 tablet (0.25 mg total) by mouth 2 (two) times daily as needed for anxiety or sleep.  30 tablet 2  . apixaban (ELIQUIS) 5 MG TABS tablet Take 5 mg by mouth 2 (two) times daily.    . Calcium Carbonate-Vitamin D (CALCIUM-VITAMIN D) 500-200 MG-UNIT per tablet Take 1 tablet by mouth daily.      [name] diltiazem (CARDIZEM CD) 240 MG 24 hr capsule Take 240 mg by mouth daily.    [name] estradiol (VIVELLE-DOT) 0.075 MG/24HR Place 1 patch onto the skin 2 (two) times a week. Wednesdays and Thursdays    . fluticasone (FLONASE) 50 MCG/ACT nasal spray Place 2 sprays into both nostrils 2 (two) times daily. 16 g 6  .  levalbuterol (XOPENEX HFA) 45 MCG/ACT inhaler Inhale 1-2 puffs into the lungs every 4 (four) hours as needed for shortness of breath.    . Melatonin 3 MG TABS Take 1 tablet by mouth daily as needed. For sleep    . mometasone-formoterol (DULERA) 200-5 MCG/ACT AERO Inhale 2 puffs into the lungs 2 (two) times daily. 13 g 11  . montelukast (SINGULAIR) 10 MG tablet TAKE ONE TABLET BY MOUTH EVERY DAY 30 tablet 6   No current facility-administered medications for this encounter.    Allergies  Allergen Reactions  . Tramadol Other (See Comments)    Severe dizziness, weakness  . Amoxicillin-Pot Clavulanate Nausea And Vomiting  . Flecainide     dizziness  . Avelox [Moxifloxacin] Hives, Itching and Rash    History   Social History  . Marital Status: Married    Spouse Name: N/A  . Number of Children: 2  . Years of Education: N/A   Occupational History  . 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     Social History Main Topics  . Smoking status: Former Smoker -- 0.50 packs/day for 20 years    Quit date: 03/04/2004  . Smokeless tobacco: Never Used  . Alcohol Use: 7.0 oz/week    14 drink(s) per week     Comment: social  . Drug Use: No  . Sexual Activity: Yes    Birth Control/ Protection: Surgical, Post-menopausal     Comment: HYST   Other Topics Concern  . Not on file   Social History Narrative    Family History  Problem Relation Age of Onset  . Stroke Father 44  . Hypertension Father   . Heart disease Father   . Diabetes Mother   . Colon cancer Neg Hx   . Rectal cancer Neg Hx   . Stomach cancer Neg Hx     ROS- All systems are reviewed and negative except as per the HPI above  Physical Exam: Filed Vitals:   06/06/14 1453  BP: 122/74  Pulse: 74  Height: 5\' 5"  (1.651 m)  Weight: 145 lb 9.6 oz (66.044 kg)    GEN- The patient is well appearing, alert and oriented x 3 today.   Head- normocephalic, atraumatic Eyes-  Sclera clear, conjunctiva pink Ears- hearing  intact Oropharynx- clear Neck- supple, no JVP Lymph- no cervical lymphadenopathy Lungs- Clear to ausculation bilaterally, normal work of breathing Heart- Regular rate and rhythm, no murmurs, rubs or gallops, PMI not laterally displaced GI- soft, NT, ND, + BS Extremities- no clubbing, cyanosis, or edema MS- no significant deformity or atrophy Skin- no rash or lesion Psych- euthymic mood, full affect Neuro- strength and sensation are intact  EKG-EKG shows SR, 74 bpm, with IRBBB, cannot r/o old Inferior Infarct  Echo-08/06/14-Left ventricle: The cavity size was normal. There was mild focal basal hypertrophy of the septum. Systolic function was normal.  The estimated ejection fraction was in the range of 60% to 65%. Wall motion was normal; there were no regional wall motion abnormalities. Doppler parameters are consistent with abnormal left ventricular relaxation (grade 1 diastolic dysfunction). - Atrial septum: No defect or patent foramen ovale was identified.  Labs reviewed form this am.  Epic records  extensively reviewed.   Assessment and Plan:  1. Paroxymal Afib Continue Cardizem 240 mg daily Continue Eliquis 5 mg bid (female/age) Continue event monitor  2. Alcohol use  Decrease alcohol use to 2 drinks a week, if can not do this, try to keep wine intake to one glass a night.  3. Tobacco use Abstain from smoking  4. Snoring history Does not have strong symptoms for sleep apnea, but, if continues to have high afib burden, would suggest sleep study.  Per pt, she is suppose to f/u with Dr. Burt Knack after event monitor is completed.  Can see as needed, between now and appointment, she has afib clinic number.

## 2014-06-06 NOTE — Progress Notes (Signed)
Subjective:    Patient ID: Elizabeth Mcconnell, female    DOB: [birth year], 69 y.o.   MRN: [id number]  HPI  Patient here for follow-up. Understands need to reschedule annual exam Reviewed recent problems with blood pressure fluctuation and palpitations -recent diagnosis of RVR on Holter monitor last week and has begun on diltiazem with anticoagulation for same  Past Medical History  Diagnosis Date  . Tobacco abuse   . Allergic rhinitis   . Oral candidiasis   . Atrial flutter     failed cardioversion 11/2012; s/p ablation 02-02-2013 by Dr Rayann Heman  . Asthma   . Pneumothorax on left 1996    left lower lung collapse s/p resection  . Arthritis   . Osteopenia 08/2013     T score -2.3 FRAX 17%/3.0% stable from prior DEXA  . ALLERGIC RHINITIS   . ANXIETY   . Endometriosis   . Adenomatous polyp of colon 10/2013    f/u colo 10/2018  . Atrial fibrillation with RVR     on Holter 06/03/14    Review of Systems  Constitutional: Positive for fatigue. Negative for unexpected weight change.  Respiratory: Negative for cough and shortness of breath.   Cardiovascular: Positive for palpitations (periodic, not exertional or positional -last seconds to minutes, uncomfortable during spell). Negative for chest pain and leg swelling.       Objective:    Physical Exam  Constitutional: She appears well-developed and well-nourished. No distress.  Cardiovascular: Normal rate, regular rhythm and normal heart sounds.   No murmur heard. Pulmonary/Chest: Effort normal and breath sounds normal. No respiratory distress.  Musculoskeletal: She exhibits no edema.  Skin:  1 cm noninflammed seb cyst L posterior shoulder near bra strap placement -     BP 138/72 mmHg  Pulse 70  Temp(Src) 97.5 F (36.4 C) (Oral)  Resp 18  Wt 145 lb (65.772 kg)  SpO2 96% Wt Readings from Last 3 Encounters:  06/06/14 145 lb (65.772 kg)  05/30/14 148 lb (67.132 kg)  12/27/13 149 lb (67.586 kg)    Lab Results  Component  Value Date   WBC 15.4* 12/10/2013   HGB 14.6 12/10/2013   HCT 44.6 12/10/2013   PLT 241 12/10/2013   GLUCOSE 96 12/10/2013   CHOL 257* 06/02/2013   TRIG 126.0 06/02/2013   HDL 80.50 06/02/2013   LDLCALC 151* 06/02/2013   ALT 10 12/10/2013   AST 17 12/10/2013   NA 138 12/10/2013   K 4.4 12/10/2013   CL 100 12/10/2013   CREATININE 0.74 12/10/2013   BUN 9 12/10/2013   CO2 24 12/10/2013   TSH 1.26 06/02/2013   INR 1.2* 01/25/2013    Ct Abdomen Pelvis W Contrast  12/10/2013   CLINICAL DATA:  Abdominal pain, constipation, lower abdominal pain starting last p.m.  EXAM: CT ABDOMEN AND PELVIS WITH CONTRAST  TECHNIQUE: Multidetector CT imaging of the abdomen and pelvis was performed using the standard protocol following bolus administration of intravenous contrast.  CONTRAST:  117mL OMNIPAQUE IOHEXOL 300 MG/ML  SOLN  COMPARISON:  None.  FINDINGS: Sagittal images of the spine shows mild degenerative changes lumbar spine.  Lung bases are unremarkable. Liver, pancreas, spleen and adrenal glands are unremarkable. No calcified gallstones are noted within gallbladder.  Kidneys are symmetrical in size and enhancement. No hydronephrosis or hydroureter. Atherosclerotic calcifications of abdominal aorta and iliac arteries. No aortic aneurysm. Moderate colonic stool. No pericecal inflammation. The terminal ileum is unremarkable.  Few diverticula are noted distal left colon. Multiple  sigmoid colon diverticula are noted. In axial image 61 there is segmental thickening of colonic wall and mild stranding of pericolonic fat. This is confirmed in coronal image 55. Findings are highly suspicious for segmental colitis or diverticulitis. No definite diverticular abscess is noted. No extraluminal air.  There is a small left inguinal hernia containing fat measures 2 cm without evidence of acute complication. The urinary bladder is unremarkable. The patient is status post hysterectomy.  No small bowel obstruction. No  abdominal ascites or free abdominal air.  Delayed renal images shows bilateral renal symmetrical excretion. Bilateral visualized proximal ureter is unremarkable.  IMPRESSION: 1. Multiple sigmoid colon diverticula are noted. There is segmental thickening of proximal sigmoid colon wall in left pelvis and mild stranding of surrounding fat. Findings are highly suspicious for segmental colitis or diverticulitis. No diverticular abscess is noted. 2. No hydronephrosis or hydroureter. Bilateral renal symmetrical excretion. 3. No small bowel obstruction. 4. Status post hysterectomy.   Electronically Signed   By: Lahoma Crocker M.D.   On: 12/10/2013 21:14       Assessment & Plan:   Seb cyst, L shoulder - hx remote "surgical removal", now recurrent in past few mo - not infected or inflamed but will refr for excision at pt request   Problem List Items Addressed This Visit    Atrial fibrillation with RVR - Primary    Recent diagnosis of same on Holter monitor last week Has begun Dilt and anticoag for same - for cards follow up this afternoon on same for next steps in management Elevated BP may improve with treatment of arrhythmia - advised follow up with cards on same      Relevant Medications   diltiazem (CARDIZEM CD) 240 MG 24 hr capsule   apixaban (ELIQUIS) 5 MG TABS tablet   Other Relevant Orders   Basic metabolic panel   CBC with Differential/Platelet   TSH   Hepatic function panel    Other Visit Diagnoses    Sebaceous cyst            Gwendolyn Grant, MD

## 2014-06-06 NOTE — Progress Notes (Signed)
Pre visit review using our clinic review tool, if applicable. No additional management support is needed unless otherwise documented below in the visit note. 

## 2014-06-06 NOTE — Telephone Encounter (Signed)
Received monitor report of events on June 03, 2014.  Cecilie Kicks has addressed.  I spoke with Roderic Palau, NP and she can see pt today at 2:00 in afib clinic.  I spoke with pt and gave her appt information. Pt can be there for appt.  Pt aware appt [facility].

## 2014-06-06 NOTE — Assessment & Plan Note (Signed)
Recent diagnosis of same on Holter monitor last week Has begun Dilt and anticoag for same - for cards follow up this afternoon on same for next steps in management Elevated BP may improve with treatment of arrhythmia - advised follow up with cards on same

## 2014-06-06 NOTE — Patient Instructions (Signed)
Follow up with Dr. Burt Knack as scheduled by his nurse. Follow up with Afib clinic as needed

## 2014-06-07 NOTE — Telephone Encounter (Signed)
The pt is wearing event monitor and this has already documented atrial fibrillation.  The pt has been seen in the AFib Clinic.  Dr Burt Knack would like to see the pt in early May for follow-up.  I left the pt a message about arranging appointment.

## 2014-06-07 NOTE — Addendum Note (Signed)
Encounter addended by: Melissa Montane, NT on: 06/07/2014  8:19 AM<BR>     Documentation filed: Charges VN

## 2014-06-08 ENCOUNTER — Encounter: Payer: Self-pay | Admitting: Internal Medicine

## 2014-06-08 ENCOUNTER — Ambulatory Visit: Payer: Medicare PPO | Admitting: Internal Medicine

## 2014-06-08 ENCOUNTER — Other Ambulatory Visit: Payer: Self-pay | Admitting: Critical Care Medicine

## 2014-06-13 ENCOUNTER — Other Ambulatory Visit (INDEPENDENT_AMBULATORY_CARE_PROVIDER_SITE_OTHER): Payer: Medicare PPO

## 2014-06-13 ENCOUNTER — Ambulatory Visit (INDEPENDENT_AMBULATORY_CARE_PROVIDER_SITE_OTHER): Payer: Medicare PPO | Admitting: Internal Medicine

## 2014-06-13 ENCOUNTER — Encounter: Payer: Self-pay | Admitting: Internal Medicine

## 2014-06-13 VITALS — BP 118/82 | HR 97 | Temp 97.8°F | Resp 16 | Ht 65.0 in | Wt 147.0 lb

## 2014-06-13 DIAGNOSIS — M799 Soft tissue disorder, unspecified: Secondary | ICD-10-CM | POA: Diagnosis not present

## 2014-06-13 DIAGNOSIS — Z23 Encounter for immunization: Secondary | ICD-10-CM | POA: Diagnosis not present

## 2014-06-13 DIAGNOSIS — I10 Essential (primary) hypertension: Secondary | ICD-10-CM

## 2014-06-13 DIAGNOSIS — F411 Generalized anxiety disorder: Secondary | ICD-10-CM

## 2014-06-13 DIAGNOSIS — E785 Hyperlipidemia, unspecified: Secondary | ICD-10-CM | POA: Diagnosis not present

## 2014-06-13 DIAGNOSIS — I4891 Unspecified atrial fibrillation: Secondary | ICD-10-CM

## 2014-06-13 DIAGNOSIS — M7989 Other specified soft tissue disorders: Secondary | ICD-10-CM

## 2014-06-13 LAB — LIPID PANEL
Cholesterol: 247 mg/dL — ABNORMAL HIGH (ref 0–200)
HDL: 70.3 mg/dL (ref >39.00)
LDL Cholesterol: 148 mg/dL — ABNORMAL HIGH (ref 0–99)
NonHDL: 176.7
Total CHOL/HDL Ratio: 4
Triglycerides: 146 mg/dL (ref 0.0–149.0)
VLDL: 29.2 mg/dL (ref 0.0–40.0)

## 2014-06-13 LAB — URINALYSIS, ROUTINE W REFLEX MICROSCOPIC
Bilirubin Urine: NEGATIVE
Hgb urine dipstick: NEGATIVE
Ketones, ur: NEGATIVE
Leukocytes, UA: NEGATIVE
Nitrite: NEGATIVE
RBC / HPF: NONE SEEN (ref 0–?)
Specific Gravity, Urine: <=1.005 — AB (ref 1.000–1.030)
Total Protein, Urine: NEGATIVE
Urine Glucose: NEGATIVE
Urobilinogen, UA: 0.2 (ref 0.0–1.0)
pH: 7 (ref 5.0–8.0)

## 2014-06-13 MED ORDER — ALPRAZOLAM 0.25 MG PO TABS: 0.2500 mg | ORAL_TABLET | Freq: Two times a day (BID) | ORAL | Status: DC | PRN

## 2014-06-13 NOTE — Assessment & Plan Note (Signed)
Refer to general surgery to consider excision

## 2014-06-13 NOTE — Progress Notes (Signed)
Subjective:    Patient ID: Elizabeth Mcconnell, female    DOB: 08-20-1945, 69 y.o.   MRN: [id number]  HPI Comments: She is wearing an event monitor  Hypertension This is a chronic problem. The current episode started more than 1 year ago. The problem is unchanged. The problem is controlled. Associated symptoms include anxiety and palpitations. Pertinent negatives include no blurred vision, chest pain, headaches, malaise/fatigue, neck pain, orthopnea, peripheral edema, PND, shortness of breath or sweats. Past treatments include calcium channel blockers. The current treatment provides moderate improvement. There are no compliance problems.       Review of Systems  Constitutional: Negative.  Negative for fever, chills, malaise/fatigue, diaphoresis, appetite change and fatigue.  HENT: Negative.   Eyes: Negative.  Negative for blurred vision.  Respiratory: Negative.  Negative for cough, choking, chest tightness and shortness of breath.   Cardiovascular: Positive for palpitations. Negative for chest pain, orthopnea, leg swelling and PND.  Gastrointestinal: Negative.  Negative for nausea, vomiting, abdominal pain, diarrhea and constipation.  Endocrine: Negative.   Genitourinary: Negative.  Negative for dysuria, urgency, hematuria, flank pain, decreased urine volume, enuresis and dyspareunia.  Musculoskeletal: Negative.  Negative for back pain, arthralgias and neck pain.  Skin: Negative.  Negative for rash.       She is concerned about a mass over the top of her left shoulder, it has been there for about a year and has recurred after previously being removed.  Allergic/Immunologic: Negative.   Neurological: Negative.  Negative for headaches.  Hematological: Negative.  Does not bruise/bleed easily.  Psychiatric/Behavioral: Negative.        Objective:   Physical Exam  Constitutional: She is oriented to person, place, and time. She appears well-developed. No distress.  HENT:  Head:  Normocephalic and atraumatic.  Mouth/Throat: Oropharynx is clear and moist. No oropharyngeal exudate.  Eyes: Conjunctivae are normal. Right eye exhibits no discharge. Left eye exhibits no discharge. No scleral icterus.  Neck: Normal range of motion. Neck supple. No JVD present. No tracheal deviation present. No thyromegaly present.  Cardiovascular: Normal rate, normal heart sounds and intact distal pulses.  An irregularly irregular rhythm present. Exam reveals no gallop and no friction rub.   No murmur heard. Pulmonary/Chest: Effort normal and breath sounds normal. No stridor. No respiratory distress. She has no wheezes. She has no rales. She exhibits no tenderness.  Abdominal: Soft. Bowel sounds are normal. She exhibits no distension and no mass. There is no tenderness. There is no rebound and no guarding.  Musculoskeletal: Normal range of motion. She exhibits no edema or tenderness.       Arms: Lymphadenopathy:    She has no cervical adenopathy.  Neurological: She is oriented to person, place, and time.  Skin: Skin is warm and dry. No rash noted. She is not diaphoretic. No erythema. No pallor.  Psychiatric: She has a normal mood and affect. Her behavior is normal. Judgment and thought content normal.  Vitals reviewed.    Lab Results  Component Value Date   WBC 9.3 06/06/2014   HGB 15.3* 06/06/2014   HCT 46.1* 06/06/2014   PLT 183.0 06/06/2014   GLUCOSE 85 06/06/2014   CHOL 257* 06/02/2013   TRIG 126.0 06/02/2013   HDL 80.50 06/02/2013   LDLCALC 151* 06/02/2013   ALT 11 06/06/2014   AST 13 06/06/2014   NA 138 06/06/2014   K 4.0 06/06/2014   CL 105 06/06/2014   CREATININE 0.72 06/06/2014   BUN 15 06/06/2014  CO2 25 06/06/2014   TSH 2.81 06/06/2014   INR 1.2* 01/25/2013       Assessment & Plan:

## 2014-06-13 NOTE — Assessment & Plan Note (Signed)
She will continue xanax as needed

## 2014-06-13 NOTE — Patient Instructions (Signed)

## 2014-06-13 NOTE — Assessment & Plan Note (Signed)
Her BP is well controlled Lytes and renal function are stable 

## 2014-06-13 NOTE — Progress Notes (Signed)
Pre visit review using our clinic review tool, if applicable. No additional management support is needed unless otherwise documented below in the visit note. 

## 2014-06-13 NOTE — Assessment & Plan Note (Signed)
Her CAD risk is quite low therefore I don't recommend a statin

## 2014-06-13 NOTE — Telephone Encounter (Signed)
I spoke with the Elizabeth Mcconnell and scheduled her to see Dr Burt Knack on 07/06/14.

## 2014-06-13 NOTE — Assessment & Plan Note (Signed)
She is currently wearing an event monitor and is being treated by Dr. Rayann Heman

## 2014-06-27 ENCOUNTER — Telehealth: Payer: Self-pay | Admitting: Critical Care Medicine

## 2014-06-27 MED ORDER — PREDNISONE 10 MG PO TABS: ORAL_TABLET | ORAL | Status: DC

## 2014-06-27 MED ORDER — AZITHROMYCIN 250 MG PO TABS: ORAL_TABLET | ORAL | Status: AC

## 2014-06-27 NOTE — Telephone Encounter (Signed)
Pt is aware of RA's recommendation. Rx have been sent in. Nothing further was needed.

## 2014-06-27 NOTE — Telephone Encounter (Signed)
Pt c/o prod cough (yellow- green), increased sob, wheezing.  Increased use of Xopenex.  Pt not sure if she needs rx for abx and maybe prednisone.  Please advise.  Allergies  Allergen Reactions  . Tramadol Other (See Comments)    Severe dizziness, weakness  . Amoxicillin-Pot Clavulanate Nausea And Vomiting  . Flecainide     dizziness  . Avelox [Moxifloxacin] Hives, Itching and Rash

## 2014-06-27 NOTE — Telephone Encounter (Signed)
Z-pak Prednisone 10 mg tabs  Take 2 tabs daily with food x 5ds, then 1 tab daily with food x 5ds then STOP  

## 2014-07-01 ENCOUNTER — Encounter: Payer: Self-pay | Admitting: *Deleted

## 2014-07-06 ENCOUNTER — Encounter: Payer: Self-pay | Admitting: Cardiovascular Disease

## 2014-07-06 ENCOUNTER — Ambulatory Visit (INDEPENDENT_AMBULATORY_CARE_PROVIDER_SITE_OTHER): Payer: Medicare PPO | Admitting: Cardiovascular Disease

## 2014-07-06 VITALS — BP 120/68 | HR 68 | Ht 65.0 in | Wt 148.4 lb

## 2014-07-06 DIAGNOSIS — I480 Paroxysmal atrial fibrillation: Secondary | ICD-10-CM | POA: Diagnosis not present

## 2014-07-06 DIAGNOSIS — I10 Essential (primary) hypertension: Secondary | ICD-10-CM

## 2014-07-06 NOTE — Progress Notes (Signed)
Cardiology Office Note   Date:  07/06/2014   ID:  Elizabeth Mcconnell, Elizabeth Mcconnell Aug 28, 1945, MRN [id number]  PCP:  [name], MD  Cardiologist:  Sherren Mocha, MD    Chief Complaint  Patient presents with  . Follow-up    atrial flutter     History of Present Illness: Elizabeth Mcconnell is a 69 y.o. female who presents for follow-up of atrial fibrillation and flutter. The patient has a long history of paroxysmal atrial fibrillation. She was diagnosed with atrial flutter in 2014. She ultimately was treated with flutter ablation by Dr. Rayann Heman. So undergone cardioversion in the past. She did well until the past few months when she has developed recurrent tachycardia palpitations.he was seen in the atrial fib clinic 06/06/2014 by Roderic Palau, NP. Because of worsening palpitations, she was started on Cardizem 240 mg daily and continued on Eliquis for anticoagulation.when she experiences paroxysms of atrial fibrillation, she has been highly symptomatic with fatigue, shortness of breath, and palpitations. An event monitor had been placed and this is now completed. Her event monitor shows paroxysmal atrial fibrillation with RVR. Her total atrial fib burden was 2%. There were no significant bradycardia arrhythmias. Aircraft the patient reports clinical improvement since starting Cardizem. Her blood pressure has been better. Palpitations are markedly diminished. She denies chest pain, shortness of breath, or leg swelling. She has no complaints today. She denies bleeding problems on chronic oral anticoagulation.   Past Medical History  Diagnosis Date  . Tobacco abuse   . Allergic rhinitis   . Oral candidiasis   . Atrial flutter     failed cardioversion 11/2012; s/p ablation 02-02-2013 by Dr Rayann Heman  . Asthma   . Pneumothorax on left 1996    left lower lung collapse s/p resection  . Arthritis   . Osteopenia 08/2013     T score -2.3 FRAX 17%/3.0% stable from prior DEXA  . ALLERGIC RHINITIS   .  ANXIETY   . Endometriosis   . Adenomatous polyp of colon 10/2013    f/u colo 10/2018  . Atrial fibrillation with RVR     on Holter 06/03/14  . Essential hypertension 05/30/2014    Past Surgical History  Procedure Laterality Date  . Lung surgery  02/1965  . Cesarean section      X 2  . Appendectomy    . Total abdominal hysterectomy w/ bilateral salpingoophorectomy  1984    Endometriosis  . Cardioversion N/A 11/27/2012    Procedure: CARDIOVERSION;  Surgeon: Thayer Headings, MD;  Location: [facility] ENDOSCOPY;  Service: Cardiovascular;  Laterality: N/A;  . Ablation  02-02-2013    CTI ablation by Dr Rayann Heman   . Abdominal hysterectomy    . Atrial flutter ablation N/A 02/02/2013    Procedure: ATRIAL FLUTTER ABLATION;  Surgeon: Coralyn Mark, MD;  Location: [facility] CATH LAB;  Service: Cardiovascular;  Laterality: N/A;    Current Outpatient Prescriptions  Medication Sig Dispense Refill  . acetaminophen (TYLENOL) 650 MG CR tablet Take 650 mg by mouth at bedtime. Second dose during day if needed    . ALPRAZolam (XANAX) 0.25 MG tablet Take 1 tablet (0.25 mg total) by mouth 2 (two) times daily as needed for anxiety or sleep. 30 tablet 3  . apixaban (ELIQUIS) 5 MG TABS tablet Take 5 mg by mouth 2 (two) times daily.    [name] diltiazem (CARDIZEM CD) 240 MG 24 hr capsule Take 240 mg by mouth daily.    [name] estradiol (VIVELLE-DOT) 0.075 MG/24HR Place  1 patch onto the skin 2 (two) times a week. Wednesdays and Thursdays    . fluticasone (FLONASE) 50 MCG/ACT nasal spray Place 2 sprays into both nostrils twice daily. 16 g 6  . levalbuterol (XOPENEX HFA) 45 MCG/ACT inhaler Inhale 1-2 puffs into the lungs every 4 (four) hours as needed for shortness of breath.    . Melatonin 3 MG TABS Take 1 tablet by mouth daily as needed. For sleep    . mometasone-formoterol (DULERA) 200-5 MCG/ACT AERO Inhale 2 puffs into the lungs 2 (two) times daily. 13 g 11  . montelukast (SINGULAIR) 10 MG tablet TAKE ONE TABLET BY MOUTH EVERY DAY 30 tablet  6  . predniSONE (DELTASONE) 10 MG tablet Take 2 tablets for 5 days, then 1 tablet for 5 days 15 tablet 0   No current facility-administered medications for this visit.    Allergies:   Tramadol; Amoxicillin-pot clavulanate; Flecainide; and Avelox   Social History:  The patient  reports that she quit smoking about 10 years ago. She has never used smokeless tobacco. She reports that she drinks about 7.0 oz of alcohol per week. She reports that she does not use illicit drugs.   Family History:  The patient's family history includes Diabetes in her mother; Heart disease in her father; Hypertension in her father; Stroke (age of onset: 44) in her father. There is no history of Colon cancer, Rectal cancer, or Stomach cancer.    ROS:  Please see the history of present illness.  Otherwise, review of systems is positive for cough, exertional dyspnea, back pain, easy bruising, heart palpitations, snoring, wheezing, anxiety, headaches.  All other systems are reviewed and negative.    PHYSICAL EXAM: VS:  BP 120/68 mmHg  Pulse 68  Ht 5\' 5"  (1.651 m)  Wt 148 lb 6.4 oz (67.314 kg)  BMI 24.70 kg/m2 , BMI Body mass index is 24.7 kg/(m^2). GEN: Well nourished, well developed, in no acute distress HEENT: normal Neck: no JVD, no masses. No carotid bruits Cardiac: RRR without murmur or gallop                Respiratory:  clear to auscultation bilaterally, normal work of breathing GI: soft, nontender, nondistended, + BS MS: no deformity or atrophy Ext: no pretibial edema, pedal pulses 2+= bilaterally Skin: warm and dry, no rash Neuro:  Strength and sensation are intact Psych: euthymic mood, full affect  EKG:  EKG is ordered today. The ekg ordered today shows normal sinus rhythm, incomplete right bundle branch block, otherwise within normal limits. Heart rate 68 bpm.  Cardiac event monitor: Normal sinus rhythm, episodes of atrial fibrillation with RVR, atrial fib burden 2%. Maximum atrial fib rate 148  bpm  Recent Labs: 06/06/2014: ALT 11; BUN 15; Creatinine 0.72; Hemoglobin 15.3*; Platelets 183.0; Potassium 4.0; Sodium 138; TSH 2.81   Lipid Panel     Component Value Date/Time   CHOL 247* 06/13/2014 0928   TRIG 146.0 06/13/2014 0928   HDL 70.30 06/13/2014 0928   CHOLHDL 4 06/13/2014 0928   VLDL 29.2 06/13/2014 0928   LDLCALC 148* 06/13/2014 0928      Wt Readings from Last 3 Encounters:  07/06/14 148 lb 6.4 oz (67.314 kg)  06/13/14 147 lb (66.679 kg)  06/06/14 145 lb 9.6 oz (66.044 kg)     Cardiac Studies Reviewed: 2-D echocardiogram 08/05/2012: Study Conclusions  - Left ventricle: The cavity size was normal. There was mild focal basal hypertrophy of the septum. Systolic function was normal. The  estimated ejection fraction was in the range of 60% to 65%. Wall motion was normal; there were no regional wall motion abnormalities. Doppler parameters are consistent with abnormal left ventricular relaxation (grade 1 diastolic dysfunction). - Atrial septum: No defect or patent foramen ovale was identified.  ASSESSMENT AND PLAN: 1.  Paroxysmal atrial fibrillation: Appears to be stable on a strategy of rate control with Cardizem and anticoagulation with Eliquis. She is maintaining sinus rhythm most of the time. I did spend a good bit of time discussing expectations related to paroxysmal atrial fibrillation. In the context of her chronic lung disease, I would anticipate her risk of progressive atrial fibrillation is fairly high. She understands that other treatment strategies might include antiarrhythmic therapy or atrial fib ablation. She has been intolerant to flecainide in the past.  2. Essential hypertension: Blood pressure is well controlled on Cardizem CD.  3. Chronic oral anticoagulation: Tolerating well without bleeding problems. CHADS-Vasc = 3 (female, Age 37-75, HTN).   Current medicines are reviewed with the patient today.  The patient does not have concerns  regarding medicines.  Labs/ tests ordered today include:  Orders Placed This Encounter  Procedures  . EKG 12-Lead    Disposition:   FU 6 months  Signed, Sherren Mocha, MD  07/06/2014 12:58 PM    [facility] [address], [place], [place]  [address] Phone: [phone]; Fax: [phone]

## 2014-07-06 NOTE — Patient Instructions (Signed)
Medication Instructions:  Your physician recommends that you continue on your current medications as directed. Please refer to the Current Medication list given to you today.  Labwork: No new orders.   Testing/Procedures: No new orders.   Follow-Up: Your physician wants you to follow-up in: 6 MONTHS with Dr Cooper.  You will receive a reminder letter in the mail two months in advance. If you don't receive a letter, please call our office to schedule the follow-up appointment.   Any Other Special Instructions Will Be Listed Below (If Applicable).   

## 2014-07-26 ENCOUNTER — Telehealth: Payer: Self-pay | Admitting: Critical Care Medicine

## 2014-07-26 MED ORDER — PREDNISONE 10 MG PO TABS: ORAL_TABLET | ORAL | Status: DC

## 2014-07-26 NOTE — Telephone Encounter (Signed)
Spoke with pt, c/o PND, some chest tightness, prod cough with yellow mucus XSunday.  Denies fever.   Pt took mucinex yesterday, which helped some.   Pt uses [facility].    Dr. Joya Gaskins please advise on recs.  Thanks!  Allergies  Allergen Reactions  . Tramadol Other (See Comments)    Severe dizziness, weakness  . Amoxicillin-Pot Clavulanate Nausea And Vomiting  . Flecainide     dizziness  . Avelox [Moxifloxacin] Hives, Itching and Rash

## 2014-07-26 NOTE — Telephone Encounter (Signed)
Spoke with pt, she is aware of recs.  pred taper sent in.  Nothing further needed.

## 2014-07-26 NOTE — Telephone Encounter (Signed)
Likely allergic Rx prednisone 10mg  Take 4 for two days three for two days two for two days one for two days #20 OV if unimproved

## 2014-08-02 ENCOUNTER — Ambulatory Visit (INDEPENDENT_AMBULATORY_CARE_PROVIDER_SITE_OTHER)
Admission: RE | Admit: 2014-08-02 | Discharge: 2014-08-02 | Disposition: A | Discharge status: Home / Self Care | Payer: Medicare PPO | Source: Ambulatory Visit | Attending: Internal Medicine | Admitting: Internal Medicine

## 2014-08-02 ENCOUNTER — Ambulatory Visit (INDEPENDENT_AMBULATORY_CARE_PROVIDER_SITE_OTHER): Payer: Medicare PPO | Admitting: Gynecology

## 2014-08-02 ENCOUNTER — Encounter: Payer: Self-pay | Admitting: Internal Medicine

## 2014-08-02 ENCOUNTER — Encounter: Payer: Self-pay | Admitting: Gynecology

## 2014-08-02 ENCOUNTER — Telehealth: Payer: Self-pay | Admitting: Critical Care Medicine

## 2014-08-02 ENCOUNTER — Ambulatory Visit (INDEPENDENT_AMBULATORY_CARE_PROVIDER_SITE_OTHER): Payer: Medicare PPO | Admitting: Internal Medicine

## 2014-08-02 VITALS — BP 120/76 | Ht 65.0 in | Wt 147.0 lb

## 2014-08-02 DIAGNOSIS — Z01419 Encounter for gynecological examination (general) (routine) without abnormal findings: Secondary | ICD-10-CM | POA: Diagnosis not present

## 2014-08-02 DIAGNOSIS — L723 Sebaceous cyst: Secondary | ICD-10-CM

## 2014-08-02 DIAGNOSIS — N898 Other specified noninflammatory disorders of vagina: Secondary | ICD-10-CM

## 2014-08-02 DIAGNOSIS — Z79890 Hormone replacement therapy (postmenopausal): Secondary | ICD-10-CM

## 2014-08-02 DIAGNOSIS — M8580 Other specified disorders of bone density and structure, unspecified site: Secondary | ICD-10-CM

## 2014-08-02 DIAGNOSIS — J4541 Moderate persistent asthma with (acute) exacerbation: Secondary | ICD-10-CM | POA: Diagnosis not present

## 2014-08-02 MED ORDER — CEFDINIR 300 MG PO CAPS: 300.0000 mg | ORAL_CAPSULE | Freq: Two times a day (BID) | ORAL | Status: DC

## 2014-08-02 MED ORDER — ESTRADIOL 0.05 MG/24HR TD PTTW
1.0000 | MEDICATED_PATCH | TRANSDERMAL | Status: DC
Start: 2014-08-02 — End: 2014-10-20

## 2014-08-02 MED ORDER — PREDNISONE 10 MG PO TABS: ORAL_TABLET | ORAL | Status: DC

## 2014-08-02 NOTE — Patient Instructions (Signed)
Office will call you to arrange the general surgery appointment in reference to the sebaceous cyst. You may obtain a copy of any labs that were done today by logging onto MyChart as outlined in the instructions provided with your AVS (after visit summary). The office will not call with normal lab results but certainly if there are any significant abnormalities then we will contact you.   Health Maintenance, Female A healthy lifestyle and preventative care can promote health and wellness.  Maintain regular health, dental, and eye exams.  Eat a healthy diet. Foods like vegetables, fruits, whole grains, low-fat dairy products, and lean protein foods contain the nutrients you need without too many calories. Decrease your intake of foods high in solid fats, added sugars, and salt. Get information about a proper diet from your caregiver, if necessary.  Regular physical exercise is one of the most important things you can do for your health. Most adults should get at least 150 minutes of moderate-intensity exercise (any activity that increases your heart rate and causes you to sweat) each week. In addition, most adults need muscle-strengthening exercises on 2 or more days a week.   Maintain a healthy weight. The body mass index (BMI) is a screening tool to identify possible weight problems. It provides an estimate of body fat based on height and weight. Your caregiver can help determine your BMI, and can help you achieve or maintain a healthy weight. For adults 20 years and older:  A BMI below 18.5 is considered underweight.  A BMI of 18.5 to 24.9 is normal.  A BMI of 25 to 29.9 is considered overweight.  A BMI of 30 and above is considered obese.  Maintain normal blood lipids and cholesterol by exercising and minimizing your intake of saturated fat. Eat a balanced diet with plenty of fruits and vegetables. Blood tests for lipids and cholesterol should begin at age 30 and be repeated every 5 years.  If your lipid or cholesterol levels are high, you are over 50, or you are a high risk for heart disease, you may need your cholesterol levels checked more frequently.Ongoing high lipid and cholesterol levels should be treated with medicines if diet and exercise are not effective.  If you smoke, find out from your caregiver how to quit. If you do not use tobacco, do not start.  Lung cancer screening is recommended for adults aged 10 80 years who are at high risk for developing lung cancer because of a history of smoking. Yearly low-dose computed tomography (CT) is recommended for people who have at least a 30-pack-year history of smoking and are a current smoker or have quit within the past 15 years. A pack year of smoking is smoking an average of 1 pack of cigarettes a day for 1 year (for example: 1 pack a day for 30 years or 2 packs a day for 15 years). Yearly screening should continue until the smoker has stopped smoking for at least 15 years. Yearly screening should also be stopped for people who develop a health problem that would prevent them from having lung cancer treatment.  If you are pregnant, do not drink alcohol. If you are breastfeeding, be very cautious about drinking alcohol. If you are not pregnant and choose to drink alcohol, do not exceed 1 drink per day. One drink is considered to be 12 ounces (355 mL) of beer, 5 ounces (148 mL) of wine, or 1.5 ounces (44 mL) of liquor.  Avoid use of street drugs. Do  not share needles with anyone. Ask for help if you need support or instructions about stopping the use of drugs.  High blood pressure causes heart disease and increases the risk of stroke. Blood pressure should be checked at least every 1 to 2 years. Ongoing high blood pressure should be treated with medicines, if weight loss and exercise are not effective.  If you are 20 to 71 years old, ask your caregiver if you should take aspirin to prevent strokes.  Diabetes screening involves  taking a blood sample to check your fasting blood sugar level. This should be done once every 3 years, after age 70, if you are within normal weight and without risk factors for diabetes. Testing should be considered at a younger age or be carried out more frequently if you are overweight and have at least 1 risk factor for diabetes.  Breast cancer screening is essential preventative care for women. You should practice "breast self-awareness." This means understanding the normal appearance and feel of your breasts and may include breast self-examination. Any changes detected, no matter how small, should be reported to a caregiver. Women in their 47s and 30s should have a clinical breast exam (CBE) by a caregiver as part of a regular health exam every 1 to 3 years. After age 71, women should have a CBE every year. Starting at age 32, women should consider having a mammogram (breast X-ray) every year. Women who have a family history of breast cancer should talk to their caregiver about genetic screening. Women at a high risk of breast cancer should talk to their caregiver about having an MRI and a mammogram every year.  Breast cancer gene (BRCA)-related cancer risk assessment is recommended for women who have family members with BRCA-related cancers. BRCA-related cancers include breast, ovarian, tubal, and peritoneal cancers. Having family members with these cancers may be associated with an increased risk for harmful changes (mutations) in the breast cancer genes BRCA1 and BRCA2. Results of the assessment will determine the need for genetic counseling and BRCA1 and BRCA2 testing.  The Pap test is a screening test for cervical cancer. Women should have a Pap test starting at age 37. Between ages 42 and 73, Pap tests should be repeated every 2 years. Beginning at age 41, you should have a Pap test every 3 years as long as the past 3 Pap tests have been normal. If you had a hysterectomy for a problem that was not  cancer or a condition that could lead to cancer, then you no longer need Pap tests. If you are between ages 27 and 28, and you have had normal Pap tests going back 10 years, you no longer need Pap tests. If you have had past treatment for cervical cancer or a condition that could lead to cancer, you need Pap tests and screening for cancer for at least 20 years after your treatment. If Pap tests have been discontinued, risk factors (such as a new sexual partner) need to be reassessed to determine if screening should be resumed. Some women have medical problems that increase the chance of getting cervical cancer. In these cases, your caregiver may recommend more frequent screening and Pap tests.  The human papillomavirus (HPV) test is an additional test that may be used for cervical cancer screening. The HPV test looks for the virus that can cause the cell changes on the cervix. The cells collected during the Pap test can be tested for HPV. The HPV test could be used to  screen women aged 1 years and older, and should be used in women of any age who have unclear Pap test results. After the age of 8, women should have HPV testing at the same frequency as a Pap test.  Colorectal cancer can be detected and often prevented. Most routine colorectal cancer screening begins at the age of 73 and continues through age 65. However, your caregiver may recommend screening at an earlier age if you have risk factors for colon cancer. On a yearly basis, your caregiver may provide home test kits to check for hidden blood in the stool. Use of a small camera at the end of a tube, to directly examine the colon (sigmoidoscopy or colonoscopy), can detect the earliest forms of colorectal cancer. Talk to your caregiver about this at age 14, when routine screening begins. Direct examination of the colon should be repeated every 5 to 10 years through age 42, unless early forms of pre-cancerous polyps or small growths are  found.  Hepatitis C blood testing is recommended for all people born from [birth year] through 1967 and any individual with known risks for hepatitis C.  Practice safe sex. Use condoms and avoid high-risk sexual practices to reduce the spread of sexually transmitted infections (STIs). Sexually active women aged 49 and younger should be checked for Chlamydia, which is a common sexually transmitted infection. Older women with new or multiple partners should also be tested for Chlamydia. Testing for other STIs is recommended if you are sexually active and at increased risk.  Osteoporosis is a disease in which the bones lose minerals and strength with aging. This can result in serious bone fractures. The risk of osteoporosis can be identified using a bone density scan. Women ages 61 and over and women at risk for fractures or osteoporosis should discuss screening with their caregivers. Ask your caregiver whether you should be taking a calcium supplement or vitamin D to reduce the rate of osteoporosis.  Menopause can be associated with physical symptoms and risks. Hormone replacement therapy is available to decrease symptoms and risks. You should talk to your caregiver about whether hormone replacement therapy is right for you.  Use sunscreen. Apply sunscreen liberally and repeatedly throughout the day. You should seek shade when your shadow is shorter than you. Protect yourself by wearing long sleeves, pants, a wide-brimmed hat, and sunglasses year round, whenever you are outdoors.  Notify your caregiver of new moles or changes in moles, especially if there is a change in shape or color. Also notify your caregiver if a mole is larger than the size of a pencil eraser.  Stay current with your immunizations. Document Released: 09/03/2010 Document Revised: 06/15/2012 Document Reviewed: 09/03/2010 [facility] Patient Information 2014 [place].

## 2014-08-02 NOTE — Progress Notes (Signed)
Elizabeth Mcconnell 16-Apr-1945 [id number]        69 y.o.  [name] for breast and pelvic exam. Several issues noted below.  Past medical history,surgical history, problem list, medications, allergies, family history and social history were all reviewed and documented as reviewed in the EPIC chart.  ROS:  Performed with pertinent positives and negatives included in the history, assessment and plan.   Additional significant findings :  none   Exam: Kim Counsellor Vitals:   08/02/14 1431  BP: 120/76  Height: 5\' 5"  (1.651 m)  Weight: 147 lb (66.679 kg)   General appearance:  Normal affect, orientation and appearance. Skin: Grossly normal excepting classic sebaceous cyst 2 cm upper posterior left shoulder HEENT: Without gross lesions.  No cervical or supraclavicular adenopathy. Thyroid normal.  Lungs:  With bilateral rhonchi Cardiac: RR, without RMG Abdominal:  Soft, nontender, without masses, guarding, rebound, organomegaly or hernia Breasts:  Examined lying and sitting without masses, retractions, discharge or axillary adenopathy. Pelvic:  Ext/BUS/vagina with generalized atrophic changes. 2 cm simple appearing posterior fourchette submucosal cyst  Adnexa  Without masses or tenderness    Anus and perineum  Normal   Rectovaginal  Normal sphincter tone without palpated masses or tenderness.    Assessment/Plan:  69 y.o. [id number] female for breast and pelvic exam  1. Atrophic genital changes/ERT. Patient continues on Vivelle 0.075 mg patch. Status post TAH BSO in the past for endometriosis. Started originally due to an acceptable hot flashes. Has tried stopping in the past with unacceptable hot flushes. I again reviewed the whole issue of HRT particularly in the 70s and increased risk of stroke heart attack DVT possible breast cancer reviewed. Patient really feels that the quality of life issue and prefers to continue. She clearly understands the issues and risks. We will try a lower  dose this year and I prescribed Vivelle 0.05 milligram patches and we will see how she does with this. Assuming she does then she'll continue on this and will readdress this issue next year.  2. Small posterior fourchette vaginal cyst. Present for years unchanged. Does not bother the patient. We'll continue to monitor and report any changes. 3. Small sebaceous cyst left upper posterior shoulder. Right along the bra strap line. Patient desires to have this excised I will refer her to general surgery. 4. Osteopenia.  DEXA 2015. T score -2.3. FRAX 17%/3.0% stable from prior DEXA. Options to initiate treatment versus observation reviewed.  She continues on HRT will hold on additional treatment and repeat her DEXA at two-year interval.  Increased calcium vitamin D reviewed. 5. Colonoscopy 2015. Repeat at their recommended interval. 6. Pap smear 2011. No history of abnormal Pap smears previously. We both agree to stop screening as she is over the age of 42 and status post hysterectomy for benign indications per current screening guidelines. 7. Mammography 06/2014. Continue with mammography. SBE monthly reviewed. 8. Bilateral rhonchi. Patient was just seen at her pulmonologist office and started on antibiotic due to her pulmonary status.  She well follow up with them in reference to this. 9. Health maintenance. No routine blood work done as patient has this done at her primary physician's office. Follow up 1 year, sooner as needed.     [name] MD, 3:01 PM 08/02/2014

## 2014-08-02 NOTE — Telephone Encounter (Signed)
Patient called last week due to sickness and was given prednisone.  Today is the last day of her Prednisone.  She says she is very lethargic, shaky, coughing a lot, has a lot of congestion in chest and tightness.  Patient thinks she needs antibiotics.  Patient scheduled to see Dr. Melvyn Novas as an acute visit today at 10:30.   To Magda Paganini for [employer]

## 2014-08-02 NOTE — Progress Notes (Signed)
Subjective:    Patient ID: Elizabeth Mcconnell, female    DOB: 08/29/1945, 69 y.o.   MRN: [id number]  HPI 18 ywof  quit smoking 2006  with  Severe atopic asthma with extremely high IgE  levels, paroxysmal atrial fibrillation felt to be secondary to albuterol, allergic rhinitis,  Former smoker .  Prior history of recurrent spontaneous pneumothorax, with left lower lobe lobectomy 1966     05/30/2014 Chief Complaint  Patient presents with  . Follow-up    Pt states she has SOB, Wheezing and sneezing. Pt has productive cough in mornings with gray mucus.  Pt with some wheezing and cough. Worse if outdoors.  Cough is prod of gray mucus.  Notes some dyspnea on exertion.  No f/c/s.  No real qhs symptoms  Pt denies any significant sore throat, nasal congestion or excess secretions, fever, chills, sweats, unintended weight loss, pleurtic or exertional chest pain, orthopnea PND, or leg swelling Pt denies any increase in rescue therapy over baseline, denies waking up needing it or having any early am or nocturnal exacerbations of coughing/wheezing/or dyspnea. Pt also denies any obvious fluctuation in symptoms with  weather or environmental change or other alleviating or aggravating factors    PUL ASTHMA HISTORY 05/30/2014 12/27/2013 09/01/2013 07/22/2012 02/19/2012 10/22/2011 04/03/2011  Symptoms >2 days/week 0-2 days/week >2 days/week 0-2 days/week 0-2 days/week 0-2 days/week 0-2 days/week  Nighttime awakenings 0-2/month 0-2/month 0-2/month 0-2/month 0-2/month 0-2/month 0-2/month  Interference with activity No limitations No limitations No limitations No limitations No limitations No limitations No limitations  SABA use > 2 days/wk--not > 1 x/day 0-2 days/wk > 2 days/wk--not > 1 x/day 0-2 days/wk 0-2 days/wk 0-2 days/wk 0-2 days/wk  Exacerbations requiring oral steroids 0-1 / year 0-1 / year 2 or more / year 0-1 / year 0-1 / year 0-1 / year 0-1 / year  rec No change in medications. Return in        4  months I will ask your PCP to address your BP issues    08/02/2014 f/u ov/Amauri Medellin re: refractory cough Baseline on dulera 200 and singulair and rare xopenex       Chief Complaint  Patient presents with  . Acute Visit    Pt c/o chest congestion, increased SOB, and cough for the past 10 days.  Cough is prod with green to yellow sputum.   fiinishing pred taper at day of ov This flare has gone on since late April 2016  Only transiently improved on xopenex hfa but hfa quite poor - see a/p Cough worse at hs and early in am but really present all day long   No obvious patterns in day to day or daytime variabilty or assoc  cp or chest tightness, subjective wheeze overt sinus or hb symptoms. No unusual exp hx or h/o childhood pna/ asthma or knowledge of premature birth.  Sleeping ok without nocturnal  or early am exacerbation  of respiratory  c/o's or need for noct saba. Also denies any obvious fluctuation of symptoms with weather or environmental changes or other aggravating or alleviating factors except as outlined above   Current Medications, Allergies, Complete Past Medical History, Past Surgical History, Family History, and Social History were reviewed in [employer] record.  ROS  The following are not active complaints unless bolded sore throat, dysphagia, dental problems, itching, sneezing,  nasal congestion or excess/ purulent secretions, ear ache,   fever, chills, sweats, unintended wt loss, pleuritic or exertional cp, hemoptysis,  orthopnea  pnd or leg swelling, presyncope, palpitations, heartburn, abdominal pain, anorexia, nausea, vomiting, diarrhea  or change in bowel or urinary habits, change in stools or urine, dysuria,hematuria,  rash, arthralgias, visual complaints, headache, numbness weakness or ataxia or problems with walking or coordination,  change in mood/affect or memory.          Objective:    amb wf nad   Wt Readings from Last 3 Encounters:  08/02/14  147 lb (66.679 kg)  07/06/14 148 lb 6.4 oz (67.314 kg)  06/13/14 147 lb (66.679 kg)    Vital signs reviewed    GEN: A/Ox3; pleasant , NAD, well nourished   HEENT:  [employer]/AT,  EACs-clear, TMs-wnl, NOSE-clear, THROAT-clear, no lesions, no postnasal drip  Few scattered white patches in post pharynx- c/w thrush  NECK:  Supple w/ fair ROM; no JVD; normal carotid impulses w/o bruits; no thyromegaly or nodules palpated; no lymphadenopathy.  RESP  Clear  P & A; w/o, wheezes/ rales/ or rhonchi.no accessory muscle use, no dullness to percussion  CARD:  RRR, no m/r/g  , no peripheral edema, pulses intact, no cyanosis or clubbing.  GI:   Soft & nt; nml bowel sounds; no organomegaly or masses detected.  Musco: Warm bil, no deformities or joint swelling noted.   Neuro: alert, no focal deficits noted.    Skin: Warm, no lesions or rashes    CXR PA and Lateral:   08/02/2014 :     I personally reviewed images and agree with radiology impression as follows:   No active disease. Hyperinflation again noted. Stable scarring and fibrotic changes left upper lobe.       Assessment & Plan:

## 2014-08-02 NOTE — Patient Instructions (Addendum)
Work on Doctor, hospital technique:  relax and gently blow all the way out then take a nice smooth deep breath back in, triggering the inhaler at same time you start breathing in.  Hold for up to 5 seconds if you can. Make sure you blow it out thru nose.  Rinse and gargle with water when done   omnicef 300 mg twice daily x 10 days and call if still discolored mucus at the end of the treatment  mucinex dm up to 1200 mg every 12 hours as needed for cough  If not doing better > Prednisone 10 mg take  4 each am x 2 days,   2 each am x 2 days,  1 each am x 2 days and stop   Only use your albuterol as a rescue medication to be used if you can't catch your breath by resting or doing a relaxed purse lip breathing pattern.  - The less you use it, the better it will work when you need it. - Ok to use up to 2 puffs  every 4 hours if you must but call for immediate appointment if use goes up over your usual need - Don't leave home without it !!  (think of it like the spare tire for your car)   Keep appt with Dr Joya Gaskins unless you are not feeling back to your normal self

## 2014-08-02 NOTE — Progress Notes (Signed)
Quick Note:  Spoke with pt and notified of results per Dr. Wert. Pt verbalized understanding and denied any questions.  ______ 

## 2014-08-03 ENCOUNTER — Telehealth: Payer: Self-pay | Admitting: *Deleted

## 2014-08-03 ENCOUNTER — Telehealth: Payer: Self-pay

## 2014-08-03 NOTE — Telephone Encounter (Signed)
Call to the patient and she is still sick, states she has asthma; finally rec'd antibiotic but it is not helping and will fup tomorrow with the doctor. Agreed for outreach in 2 weeks to fup with [facility] apt.,

## 2014-08-03 NOTE — Telephone Encounter (Signed)
-----   Message from [name], MD sent at 08/02/2014  3:08 PM EDT ----- General surgeon appointment reference sebaceous cyst bothersome to the patient on her left shoulder.

## 2014-08-03 NOTE — Telephone Encounter (Signed)
Appointment 08/22/14 @ 11:00am with Dr. Brantley Stage pt informed

## 2014-08-08 ENCOUNTER — Telehealth: Payer: Self-pay | Admitting: Critical Care Medicine

## 2014-08-08 ENCOUNTER — Encounter: Payer: Self-pay | Admitting: Internal Medicine

## 2014-08-08 ENCOUNTER — Encounter: Payer: Self-pay | Admitting: Adult Health

## 2014-08-08 ENCOUNTER — Ambulatory Visit (INDEPENDENT_AMBULATORY_CARE_PROVIDER_SITE_OTHER): Payer: Medicare PPO | Admitting: Adult Health

## 2014-08-08 VITALS — BP 132/62 | HR 79 | Temp 98.7°F | Ht 65.0 in | Wt 148.6 lb

## 2014-08-08 DIAGNOSIS — B370 Candidal stomatitis: Secondary | ICD-10-CM | POA: Insufficient documentation

## 2014-08-08 DIAGNOSIS — J4541 Moderate persistent asthma with (acute) exacerbation: Secondary | ICD-10-CM

## 2014-08-08 DIAGNOSIS — J329 Chronic sinusitis, unspecified: Secondary | ICD-10-CM

## 2014-08-08 MED ORDER — LEVALBUTEROL HCL 0.63 MG/3ML IN NEBU: 0.6300 mg | INHALATION_SOLUTION | Freq: Once | RESPIRATORY_TRACT | Status: DC

## 2014-08-08 MED ORDER — CLOTRIMAZOLE 10 MG MT TROC: 10.0000 mg | Freq: Every day | OROMUCOSAL | Status: DC

## 2014-08-08 MED ORDER — HYDROCODONE-HOMATROPINE 5-1.5 MG/5ML PO SYRP: 5.0000 mL | ORAL_SOLUTION | Freq: Every evening | ORAL | Status: DC | PRN

## 2014-08-08 MED ORDER — CEFDINIR 300 MG PO CAPS: 300.0000 mg | ORAL_CAPSULE | Freq: Two times a day (BID) | ORAL | Status: DC

## 2014-08-08 MED ORDER — LEVALBUTEROL HCL 0.63 MG/3ML IN NEBU
0.6300 mg | INHALATION_SOLUTION | Freq: Once | RESPIRATORY_TRACT | Status: AC
  Administered 2014-08-08: 0.63 mg via RESPIRATORY_TRACT

## 2014-08-08 NOTE — Telephone Encounter (Signed)
Pt reports going on week 3 of being sick and still no improvement.  Saw Dr Melvyn Novas 08/02/14 and was started on Omnicef 300 BID, Mucinex PRN and was given pred taper to keep on hand if symptoms worsened.  Pt states that she does not feel any better since completing abx and feels the Mucinex made her feel worse.  Pt never started Pred Taper -- wanted to check with PW first since she is worsening so much.  Pt c/o increased SOB, cough, shakiness, PND, chest and head congestion, fatigue and decreased stamina.  Had to cancel vacation d/t being so sick and not having the stamina to go.  Requests further rec's. PW-please advise. Thanks.   Patient Instructions     Work on perfecting inhaler technique: relax and gently blow all the way out then take a nice smooth deep breath back in, triggering the inhaler at same time you start breathing in. Hold for up to 5 seconds if you can. Make sure you blow it out thru nose. Rinse and gargle with water when done   omnicef 300 mg twice daily x 10 days and call if still discolored mucus at the end of the treatment  mucinex dm up to 1200 mg every 12 hours as needed for cough  If not doing better > Prednisone 10 mg take 4 each am x 2 days, 2 each am x 2 days, 1 each am x 2 days and stop   Only use your albuterol as a rescue medication to be used if you can't catch your breath by resting or doing a relaxed purse lip breathing pattern.  - The less you use it, the better it will work when you need it. - Ok to use up to 2 puffs every 4 hours if you must but call for immediate appointment if use goes up over your usual need - Don't leave home without it !! (think of it like the spare tire for your car)   Keep appt with Dr Joya Gaskins unless you are not feeling back to your normal self    Allergies  Allergen Reactions  . Tramadol Other (See Comments)    Severe dizziness, weakness  . Amoxicillin-Pot Clavulanate Nausea And Vomiting  . Flecainide     dizziness  .  Avelox [Moxifloxacin] Hives, Itching and Rash

## 2014-08-08 NOTE — Progress Notes (Signed)
   Subjective:    Patient ID: Elizabeth Mcconnell, female    DOB: Dec 27, 1945, 69 y.o.   MRN: [id number]  HPI 51  WF with known hx of  Severe atopic asthma with extremely high IG levels, paroxysmal atrial fibrillation felt to be secondary to albuterol, allergic rhinitis,  Former smoker .  Prior history of recurrent spontaneous pneumothorax, with left lower lobe lobectomy.  08/08/2014 Acute OV  Presents for persistent symptoms of cough, congestion, wheezing , sinus drainage and dyspnea for 3 weeks.  Seen 1 week ago, tx for bronchitis/sinusitis with omnicef x 10 days . XRay with no acute process.  Cough is keeping her up at night .  She is suppose to go on vacation but had to cancel.  Very frustrated she is not better.  She denies chest pain, orthopnea, edema , fever. Or hemoptysis .     Review of Systems Constitutional:   No  weight loss, night sweats,  Fevers, chills, fatigue, or  lassitude.  HEENT:   No headaches,  Difficulty swallowing,  Tooth/dental problems, or  Sore throat,                No sneezing, itching, ear ache,  +nasal congestion, post nasal drip,   CV:  No chest pain,  Orthopnea, PND, swelling in lower extremities, anasarca, dizziness, palpitations, syncope.   GI  No heartburn, indigestion, abdominal pain, nausea, vomiting, diarrhea, change in bowel habits, loss of appetite, bloody stools.   Resp:  No chest wall deformity  Skin: no rash or lesions.  GU: no dysuria, change in color of urine, no urgency or frequency.  No flank pain, no hematuria   MS:  No joint pain or swelling.  No decreased range of motion.  No back pain.  Psych:  No change in mood or affect. No depression or anxiety.  No memory loss.         Objective:   Physical Exam  GEN: A/Ox3; pleasant , NAD, well nourished   HEENT:  [place]/AT,  EACs-clear, TMs-wnl, NOSE-clear, THROAT-clear, no lesions, no postnasal drip  Few scattered white patches in post pharynx- c/w thrush  NECK:  Supple w/ fair  ROM; no JVD; normal carotid impulses w/o bruits; no thyromegaly or nodules palpated; no lymphadenopathy.  RESP  Few exp wheeze , no stridor .no accessory muscle use, no dullness to percussion  CARD:  RRR, no m/r/g  , no peripheral edema, pulses intact, no cyanosis or clubbing.  GI:   Soft & nt; nml bowel sounds; no organomegaly or masses detected.  Musco: Warm bil, no deformities or joint swelling noted.   Neuro: alert, no focal deficits noted.    Skin: Warm, no lesions or rashes        Assessment & Plan:

## 2014-08-08 NOTE — Patient Instructions (Signed)
Extend Omnicef for 4 day  CT sinus as planned  Begin Mycelex troches five times daily for 7 days for thrush  Begin yogurt daily .  Prednisone taper over next week.  Mucinex Twice daily  As needed  Cough/congestion  Delsym 2 tsp Twice daily  As needed  Cough .  Hydromet 1 tsp At bedtime  As needed  Cough. -may make you sleepy.  Please contact office for sooner follow up if symptoms do not improve or worsen or seek emergency care  Follow up Dr. Joya Gaskins  In 3-4 weeks and As needed

## 2014-08-08 NOTE — Assessment & Plan Note (Signed)
Begin Mycelex troches five times daily for 7 days for thrush  Begin yogurt daily .  Please contact office for sooner follow up if symptoms do not improve or worsen or seek emergency care  Follow up Dr. Joya Gaskins  In 3-4 weeks and As needed

## 2014-08-08 NOTE — Telephone Encounter (Signed)
Needs a Sinus CT without contrast eval sinusitis Needs another OV ?tammy or work in with me wed.

## 2014-08-08 NOTE — Telephone Encounter (Signed)
Pt has been scheduled to see TP today 2:30pm. Order will be placed for CT sinus. Nothing further was needed.

## 2014-08-08 NOTE — Assessment & Plan Note (Signed)
Slow to resolve exacerbation with bronchitis /sinusitis  Check CT sinus Extend abx to cover total 14 d  Steroid burst  Plan  Extend Omnicef for 4 day  CT sinus as planned  Begin Mycelex troches five times daily for 7 days for thrush  Begin yogurt daily .  Prednisone taper over next week.  Mucinex Twice daily  As needed  Cough/congestion  Delsym 2 tsp Twice daily  As needed  Cough .  Hydromet 1 tsp At bedtime  As needed  Cough. -may make you sleepy.  Please contact office for sooner follow up if symptoms do not improve or worsen or seek emergency care  Follow up Dr. Joya Gaskins  In 3-4 weeks and As needed

## 2014-08-08 NOTE — Assessment & Plan Note (Addendum)
DDX of  difficult airways management all start with A and  include Adherence, Ace Inhibitors, Acid Reflux, Active Sinus Disease, Alpha 1 Antitripsin deficiency, Anxiety masquerading as Airways dz,  ABPA,  allergy(esp in young), Aspiration (esp in elderly), Adverse effects of DPI,  Active smokers, plus two Bs  = Bronchiectasis and Beta blocker use..and one C= CHF   Adherence is always the initial "prime suspect" and is a multilayered concern that requires a "trust but verify" approach in every patient - starting with knowing how to use medications, especially inhalers, correctly, keeping up with refills and understanding the fundamental difference between maintenance and prns vs those medications only taken for a very short course and then stopped and not refilled.  The proper method of use, as well as anticipated side effects, of a metered-dose inhaler are discussed and demonstrated to the patient. Improved effectiveness after extensive coaching during this visit to a level of approximately  75% from a baseline of 25%   ? Active sinus dz > try omnicef x 10 days and consider sinus ct  ? Allergy > may need repeat pred pulse/ continue singulair   ? Acid (or non-acid) GERD > always difficult to exclude as up to 75% of pts in some series report no assoc GI/ Heartburn symptoms>  Consider  trial  of  max (24h)  acid suppression and diet restrictions/ reviewed and instructions given in writing.   I had an extended discussion with the patient reviewing all relevant studies completed to date and  lasting 15 to 20 minutes of a 25 minute visit on the following ongoing concerns:  Each maintenance medication was reviewed in detail including most importantly the difference between maintenance and as needed and under what circumstances the prns are to be used.  Please see instructions for details which were reviewed in writing and the patient given a copy.

## 2014-08-09 ENCOUNTER — Ambulatory Visit (INDEPENDENT_AMBULATORY_CARE_PROVIDER_SITE_OTHER)
Admission: RE | Admit: 2014-08-09 | Discharge: 2014-08-09 | Disposition: A | Discharge status: Home / Self Care | Payer: Medicare PPO | Source: Ambulatory Visit | Attending: Critical Care Medicine | Admitting: Critical Care Medicine

## 2014-08-09 ENCOUNTER — Other Ambulatory Visit: Payer: Self-pay | Admitting: Gynecology

## 2014-08-09 DIAGNOSIS — J329 Chronic sinusitis, unspecified: Secondary | ICD-10-CM

## 2014-08-22 ENCOUNTER — Ambulatory Visit: Payer: Self-pay | Admitting: Surgery

## 2014-08-22 DIAGNOSIS — L723 Sebaceous cyst: Secondary | ICD-10-CM | POA: Diagnosis not present

## 2014-08-31 ENCOUNTER — Encounter: Payer: Self-pay | Admitting: Critical Care Medicine

## 2014-08-31 ENCOUNTER — Ambulatory Visit (INDEPENDENT_AMBULATORY_CARE_PROVIDER_SITE_OTHER): Payer: Medicare PPO | Admitting: Critical Care Medicine

## 2014-08-31 VITALS — BP 118/76 | HR 119 | Temp 97.6°F | Ht 65.0 in | Wt 149.0 lb

## 2014-08-31 DIAGNOSIS — J4540 Moderate persistent asthma, uncomplicated: Secondary | ICD-10-CM

## 2014-08-31 NOTE — Assessment & Plan Note (Signed)
Moderate persistent asthma with recent flare d/t tracheobronchitis now improved Plan Stay on dulera/singulair/nasal steroid

## 2014-08-31 NOTE — Patient Instructions (Signed)
No change in medications. Return in         4 months 

## 2014-08-31 NOTE — Progress Notes (Signed)
Subjective:    Patient ID: Elizabeth Mcconnell, female    DOB: 1945-05-25, 69 y.o.   MRN: [id number]  HPI 08/31/2014 Chief Complaint  Patient presents with  . Follow-up    asthma; feels better; stills has nasal congestion at times; cough some; chest tightness some;   Has been on ABX x 14d and steroids for sinusitis and bronchitis.  occ nasal congestion  No saba use. Overall is better PUL ASTHMA HISTORY 08/31/2014 05/30/2014 12/27/2013 09/01/2013 07/22/2012 02/19/2012 10/22/2011  Symptoms 0-2 days/week >2 days/week 0-2 days/week >2 days/week 0-2 days/week 0-2 days/week 0-2 days/week  Nighttime awakenings 0-2/month 0-2/month 0-2/month 0-2/month 0-2/month 0-2/month 0-2/month  Interference with activity No limitations No limitations No limitations No limitations No limitations No limitations No limitations  SABA use 0-2 days/wk > 2 days/wk--not > 1 x/day 0-2 days/wk > 2 days/wk--not > 1 x/day 0-2 days/wk 0-2 days/wk 0-2 days/wk  Exacerbations requiring oral steroids 0-1 / year 0-1 / year 0-1 / year 2 or more / year 0-1 / year 0-1 / year 0-1 / year   Pt denies any significant sore throat, nasal congestion or excess secretions, fever, chills, sweats, unintended weight loss, pleurtic or exertional chest pain, orthopnea PND, or leg swelling Pt denies any increase in rescue therapy over baseline, denies waking up needing it or having any early am or nocturnal exacerbations of coughing/wheezing/or dyspnea. Pt also denies any obvious fluctuation in symptoms with  weather or environmental change or other alleviating or aggravating factors  Hx of afib, occ HR goes up.   Current Medications, Allergies, Complete Past Medical History, Past Surgical History, Family History, and Social History were reviewed in [place] record per todays encounter:  08/31/2014  Review of Systems  Constitutional: Negative.   HENT: Positive for postnasal drip and rhinorrhea. Negative for ear pain, sinus  pressure, sore throat, trouble swallowing and voice change.   Eyes: Negative.   Respiratory: Positive for cough. Negative for apnea, choking, chest tightness, shortness of breath, wheezing and stridor.   Cardiovascular: Negative.  Negative for chest pain, palpitations and leg swelling.  Gastrointestinal: Negative.  Negative for nausea, vomiting, abdominal pain and abdominal distention.  Genitourinary: Negative.   Musculoskeletal: Negative.  Negative for myalgias and arthralgias.  Skin: Negative.  Negative for rash.  Allergic/Immunologic: Negative.  Negative for environmental allergies and food allergies.  Neurological: Negative.  Negative for dizziness, syncope, weakness and headaches.  Hematological: Negative.  Negative for adenopathy. Does not bruise/bleed easily.  Psychiatric/Behavioral: Negative.  Negative for sleep disturbance and agitation. The patient is not nervous/anxious.        Objective:   Physical Exam Filed Vitals:   08/31/14 0921  BP: 118/76  Pulse: 119  Temp: 97.6 F (36.4 C)  TempSrc: Oral  Height: 5\' 5"  (1.651 m)  Weight: 149 lb (67.586 kg)  SpO2: 98%    Gen: Pleasant, well-nourished, in no distress,  normal affect  ENT: No lesions,  mouth clear,  oropharynx clear, no postnasal drip  Neck: No JVD, no TMG, no carotid bruits  Lungs: No use of accessory muscles, no dullness to percussion, clear without rales or rhonchi  Cardiovascular: RRR, heart sounds normal, no murmur or gallops, no peripheral edema  Abdomen: soft and NT, no HSM,  BS normal  Musculoskeletal: No deformities, no cyanosis or clubbing  Neuro: alert, non focal  Skin: Warm, no lesions or rashes  No results found.  CT sinus 08/2014 reviewed  IMPRESSION: Minimal mucosal thickening inferior aspect right maxillary  sinus measuring up to 3.5 mm. Otherwise paranasal sinuses are clear.        Assessment & Plan:  I personally reviewed all images and lab data in the [facility] system as well as  any outside material available during this office visit and agree with the  radiology impressions.   Asthma, moderate persistent Moderate persistent asthma with recent flare d/t tracheobronchitis now improved Plan Stay on dulera/singulair/nasal steroid

## 2014-09-08 DIAGNOSIS — L723 Sebaceous cyst: Secondary | ICD-10-CM | POA: Diagnosis not present

## 2014-09-12 ENCOUNTER — Telehealth: Payer: Self-pay

## 2014-09-12 NOTE — Telephone Encounter (Signed)
Call to discuss AWV, the patient agreed to come 7/29 at11am; Will call for any scheduling issues; [phone]

## 2014-09-27 ENCOUNTER — Other Ambulatory Visit: Payer: Self-pay | Admitting: Cardiology

## 2014-09-27 NOTE — Telephone Encounter (Signed)
REFILL 

## 2014-09-30 ENCOUNTER — Ambulatory Visit (INDEPENDENT_AMBULATORY_CARE_PROVIDER_SITE_OTHER): Payer: Medicare PPO

## 2014-09-30 VITALS — BP 120/80 | Ht 65.0 in | Wt 151.0 lb

## 2014-09-30 DIAGNOSIS — Z0000 Encounter for general adult medical examination without abnormal findings: Secondary | ICD-10-CM

## 2014-09-30 NOTE — Progress Notes (Addendum)
Subjective:   Elizabeth Mcconnell is a 69 y.o. female who presents for Medicare Annual (Subsequent) preventive examination.  Review of Systems:  HRA assessment completed during visit;  Patient is here for Annual Wellness Assessment The Patient was informed that this wellness visit is to identify risk and educate on how to reduce risk for increase disease through lifestyle changes.  The patient verbalized understanding that any voiced medical issues still need to be directed to their physician at a follow up visit.   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 Due to meds; had cardioversion; Did the ablation and worked for 1.5 years;  This year she started having issues; and now is back on cardia and Eliquis po Dr. Joya Gaskins recommended melatonin for sleep and this helps; as she is worried about chair   Lost spouse to cancer x 83 yo; very quickly after long standing colon cancer;  Has 2 children;   7, 4, 5 months grand children  Remarried x 3 yo;  2 levels; stay downstairs Home safe  Lifestyle changes reviewed: chol not an issue; no medication Stays in doors when very hot;   Diet: had lost weight; Weight is generally 145 to 148; Usually have regular meals; fruits and vegetables; Has always tried to eat healthy;  Exercise: She does deep water aerobics x 2 days a week; uses barbells; Loves ballroom dancing/ music is relaxing  SAFETY Generalized Safety in the home reviewed/   Fall prevention;  Urinary or fecal incontinence reviewed  Educated on prevention falls; Exercise, toning and strengthening; Balance exercises  Home/ safety; removal of throw rugs; bathroom handrails; Unlevel surfaces outside; Smoke detectors;sun protection; seatbelts   Screenings  overdue Ophthalmology exam; every year; contact; Charlie Brynes Immunizations; up to date Shingles: had the shingles shot; Dr. Zada Girt;  It is recommended that you do not take it with Pneumovax 23.  Colonoscopy; 10/27/13 EKG 08/2012 Dexa scan; 08/2013/ showing a little osteopenia  Mammogram 06/2014  Hearing: 4000hz  both ears Dental: biannually  Care Team updated in [place] Dr. Joya Gaskins Dr. Rayann Heman Dr. Burt Knack     Cardiac Risk Factors include: advanced age (>11men, >65 women);dyslipidemia;Other (see comment)     Objective:     Vitals: BP 120/80 mmHg  Ht 5\' 5"  (1.651 m)  Wt 151 lb (68.493 kg)  BMI 25.13 kg/m2  Tobacco History  Smoking status  . Former Smoker -- 0.50 packs/day for 20 years  . Quit date: 03/04/2004  Smokeless tobacco  . Never Used     Counseling given: Yes   Past Medical History  Diagnosis Date  . Tobacco abuse   . Allergic rhinitis   . Oral candidiasis   . Atrial flutter     failed cardioversion 11/2012; s/p ablation 02-02-2013 by Dr Rayann Heman  . Asthma   . Pneumothorax on left 1996    left lower lung collapse s/p resection  . Arthritis   . Osteopenia 08/2013     T score -2.3 FRAX 17%/3.0% stable from prior DEXA  . ALLERGIC RHINITIS   . ANXIETY   . Endometriosis   . Adenomatous polyp of colon 10/2013    f/u colo 10/2018  . Atrial fibrillation with RVR     on Holter 06/03/14  . Essential hypertension 05/30/2014   Past Surgical History  Procedure Laterality Date  . Lung surgery  02/1965  . Cesarean section      X 2  . Appendectomy    . Total abdominal hysterectomy w/ bilateral salpingoophorectomy  1984    Endometriosis  . Cardioversion N/A 11/27/2012    Procedure: CARDIOVERSION;  Surgeon: Thayer Headings, MD;  Location: [facility] ENDOSCOPY;  Service: Cardiovascular;  Laterality: N/A;  . Ablation  02-02-2013    CTI ablation by Dr Rayann Heman   . Abdominal hysterectomy    . Atrial flutter ablation N/A 02/02/2013    Procedure: ATRIAL FLUTTER ABLATION;   Surgeon: Coralyn Mark, MD;  Location: [facility] CATH LAB;  Service: Cardiovascular;  Laterality: N/A;   Family History  Problem Relation Age of Onset  . Stroke Father 5  . Hypertension Father   . Heart disease Father   . Diabetes Mother   . Colon cancer Neg Hx   . Rectal cancer Neg Hx   . Stomach cancer Neg Hx    History  Sexual Activity  . Sexual Activity: Yes  . Birth Control/ Protection: Surgical, Post-menopausal    Comment: HYST-1st intercourse 69 yo-Fewer than 5 partners    Outpatient Encounter Prescriptions as of 09/30/2014  Medication Sig  . acetaminophen (TYLENOL) 650 MG CR tablet Take 650 mg by mouth at bedtime. Second dose during day if needed  . ALPRAZolam (XANAX) 0.25 MG tablet Take 1 tablet (0.25 mg total) by mouth 2 (two) times daily as needed for anxiety or sleep.  [name] apixaban (ELIQUIS) 5 MG TABS tablet Take 5 mg by mouth 2 (two) times daily.  [name] CARTIA XT 240 MG 24 hr capsule TAKE 1 CAPSULE BY MOUTH AT BEDTIME  . estradiol (VIVELLE-DOT) 0.075 MG/24HR PLACE ONE PATCH onto THE SKIN TWICE A WEEK  . fluticasone (FLONASE) 50 MCG/ACT nasal spray Place 2 sprays into both nostrils twice daily.  [name] levalbuterol (XOPENEX HFA) 45 MCG/ACT inhaler Inhale 1-2 puffs into the lungs every 4 (four) hours as needed for shortness of breath.  . Melatonin 3 MG TABS Take 1 tablet by mouth daily as needed. For sleep  . mometasone-formoterol (DULERA) 200-5 MCG/ACT AERO Inhale 2 puffs into the lungs 2 (two) times daily.  . montelukast (SINGULAIR) 10 MG tablet TAKE ONE TABLET BY MOUTH EVERY DAY  . estradiol (VIVELLE-DOT) 0.05 MG/24HR patch Place 1 patch (0.05 mg total) onto the skin 2 (two) times a week. (Patient not taking: Reported on 09/30/2014)  . HYDROcodone-homatropine (HYDROMET) 5-1.5 MG/5ML syrup Take 5 mLs by mouth at bedtime as needed for cough. (Patient not taking: Reported on 08/31/2014)   No facility-administered encounter medications on file as of 09/30/2014.    Activities of Daily  Living In your present state of health, do you have any difficulty performing the following activities: 09/30/2014  Hearing? N  Vision? N  Difficulty concentrating or making decisions? N  Walking or climbing stairs? N  Dressing or bathing? N  Doing errands, shopping? N  Preparing Food and eating ? N  Using the Toilet? N  In the past six months, have you accidently leaked urine? N  Do you have problems with loss of bowel control? N  Managing your Medications? N  Managing your Finances? N  Housekeeping or managing your Housekeeping? N    Patient Care Team: Janith Lima, MD as PCP - General (Internal Medicine) Elsie Stain, MD (Pulmonary Disease) Thompson Grayer, MD (Cardiology) Sherren Mocha, MD (Cardiology) [name], MD (Gynecology) Erline Levine, MD (Neurosurgery) Lafayette Dragon,  MD (Gastroenterology)    Assessment:    Objective:   The goal of the wellness visit is to assist the patient how to close the gaps in care and create a preventative care plan for the patient.  Personalized Education was given regarding:   Pt determined a personalized goal; see patient goals;  Assessment included: smoking (secondary) educated as appropriate; including LDCT if as ppropriate  Bone density scan as appropriate Calcium and Vit D as appropriate/ Osteoporosis risk reviewed/ educated  Taking meds without issues; no barriers identified  Labs were and fup visit noted with MD if labs are due to be re-drawn.  Stress: Recommendations for managing stress if assessed as a factor;   No Risk for hepatitis or high risk social behavior identified via hepatitis screen  Educated on shingles and follow up with insurance company for co-pays or charges applied to Part D benefit. Educated on Vaccines;  Safety issues reviewed;  Cognition assessed by AD8; Score 0 MMSE deferred as the patient stated they had no memory issues; No identified risk were noted; The patient was oriented x 3;  appropriate in dress and manner and no objective failures at ADL's or IADL's.   Depression screen negative;   Functional status reviewed; no losses in function x 1 year  Bowel and bladder issues assessed  End of life planning was discussed; aging in home or other; plans to complete HCPOA were discussed if not completed          Exercise Activities and Dietary recommendations Current Exercise Habits:: Structured exercise class, Time (Minutes): 60, Frequency (Times/Week): 2 (plus up and busy all the time; very active), Weekly Exercise (Minutes/Week): 120  Goals    . patient centered     Wants to keep moving; staying active Continue with pool work      [place]  09/30/2014 06/13/2014 06/02/2013  Falls in the past year? No No No   Depression Screen PHQ 2/9 Scores 09/30/2014 06/02/2013  PHQ - 2 Score 0 0     Cognitive Testing No flowsheet data found.  Immunization History  Administered Date(s) Administered  . DTaP 04/19/2012  . Influenza Split 12/03/2010  . Influenza Whole 12/02/2008, 12/20/2011  . Influenza,inj,Quad PF,36+ Mos 11/17/2012  . Influenza-Unspecified 01/03/2014  . Pneumococcal Conjugate-13 06/13/2014  . Pneumococcal Polysaccharide-23 12/03/2007, 04/19/2012  . Tdap 04/07/2012   Screening Tests Health Maintenance  Topic Date Due  . ZOSTAVAX  09/26/2003  . PAP SMEAR  06/13/2011  . INFLUENZA VACCINE  10/03/2014  . MAMMOGRAM  06/02/2016  . COLONOSCOPY  10/28/2018  . TETANUS/TDAP  04/07/2022  . DEXA SCAN  Completed  . PNA vac Low Risk Adult  Completed      Plan:   Plan  Review of screenings completed and plan for completion individualized: Education and counseling performed based on assessed risks today. Patient provided with a copy of personalized plan for preventive services.    Referrals: none due  To call GYN about correct estradiol dose  Advanced directive:LW completed   During the course of the visit the patient was educated  and counseled about the following appropriate screening and preventive services:   Vaccines to include Pneumoccal, Influenza, Hepatitis B, Td, Zostavax, HCV/   (states she had shingles while attended by Dr. Nyoka Cowden several years ago, not sure of date.)  Electrocardiogram  Cardiovascular Disease  Colorectal cancer screening   Bone density screening  Diabetes screening  Glaucoma screening  Mammography/PAP  Nutrition counseling   Patient Instructions (the  written plan) was given to the patient.   VJDYN,XGZFP, RN  09/30/2014     Medical screening examination/treatment/procedure(s) were performed by non-physician practitioner and as supervising physician I was immediately available for consultation/collaboration. I agree with above. [name], MD

## 2014-09-30 NOTE — Patient Instructions (Addendum)
Elizabeth Mcconnell , Thank you for taking time to come for your Medicare Wellness Visit. I appreciate your ongoing commitment to your health goals. Please review the following plan we discussed and let me know if I can assist you in the future.   Will document shingles: fup with Dr. Levin Erp but listed as a part of [place]/ Documented zoster as the patient confirmed receipt.    These are the goals we discussed: Goals    . patient centered     Wants to keep moving; staying active Continue with pool work       This is a list of the screening recommended for you and due dates:  Health Maintenance  Topic Date Due  . Shingles Vaccine  09/26/2003  . Pap Smear  06/13/2011  . Flu Shot  10/03/2014  . Mammogram  06/02/2016  . Colon Cancer Screening  10/28/2018  . Tetanus Vaccine  04/07/2022  . DEXA scan (bone density measurement)  Completed  . Pneumonia vaccines  Completed     Fall Prevention and Home Safety Falls cause injuries and can affect all age groups. It is possible to prevent falls.  HOW TO PREVENT FALLS  Wear shoes with rubber soles that do not have an opening for your toes.  Keep the inside and outside of your house well lit.  Use night lights throughout your home.  Remove clutter from floors.  Clean up floor spills.  Remove throw rugs or fasten them to the floor with carpet tape.  Do not place electrical cords across pathways.  Put grab bars by your tub, shower, and toilet. Do not use towel bars as grab bars.  Put handrails on both sides of the stairway. Fix loose handrails.  Do not climb on stools or stepladders, if possible.  Do not wax your floors.  Repair uneven or unsafe sidewalks, walkways, or stairs.  Keep items you use a lot within reach.  Be aware of pets.  Keep emergency numbers next to the telephone.  Put smoke detectors in your home and near bedrooms. Ask your doctor what other things you can do to prevent falls. Document Released:  12/15/2008 Document Revised: 08/20/2011 Document Reviewed: 05/21/2011 [facility] Patient Information 2015 [place], [place]. This information is not intended to replace advice given to you by your health care provider. Make sure you discuss any questions you have with your health care provider.  Health Maintenance Adopting a healthy lifestyle and getting preventive care can go a long way to promote health and wellness. Talk with your health care provider about what schedule of regular examinations is right for you. This is a good chance for you to check in with your provider about disease prevention and staying healthy. In between checkups, there are plenty of things you can do on your own. Experts have done a lot of research about which lifestyle changes and preventive measures are most likely to keep you healthy. Ask your health care provider for more information. WEIGHT AND DIET  Eat a healthy diet  Be sure to include plenty of vegetables, fruits, low-fat dairy products, and lean protein.  Do not eat a lot of foods high in solid fats, added sugars, or salt.  Get regular exercise. This is one of the most important things you can do for your health.  Most adults should exercise for at least 150 minutes each week. The exercise should increase your heart rate and make you sweat (moderate-intensity exercise).  Most adults should also do strengthening  exercises at least twice a week. This is in addition to the moderate-intensity exercise.  Maintain a healthy weight  Body mass index (BMI) is a measurement that can be used to identify possible weight problems. It estimates body fat based on height and weight. Your health care provider can help determine your BMI and help you achieve or maintain a healthy weight.  For females 42 years of age and older:   A BMI below 18.5 is considered underweight.  A BMI of 18.5 to 24.9 is normal.  A BMI of 25 to 29.9 is considered overweight.  A BMI of 30 and  above is considered obese.  Watch levels of cholesterol and blood lipids  You should start having your blood tested for lipids and cholesterol at 71 years of age, then have this test every 5 years.  You may need to have your cholesterol levels checked more often if:  Your lipid or cholesterol levels are high.  You are older than 71 years of age.  You are at high risk for heart disease.  CANCER SCREENING   Lung Cancer  Lung cancer screening is recommended for adults 95-22 years old who are at high risk for lung cancer because of a history of smoking.  A yearly low-dose CT scan of the lungs is recommended for people who:  Currently smoke.  Have quit within the past 15 years.  Have at least a 30-pack-year history of smoking. A pack year is smoking an average of one pack of cigarettes a day for 1 year.  Yearly screening should continue until it has been 15 years since you quit.  Yearly screening should stop if you develop a health problem that would prevent you from having lung cancer treatment.  Breast Cancer  Practice breast self-awareness. This means understanding how your breasts normally appear and feel.  It also means doing regular breast self-exams. Let your health care provider know about any changes, no matter how small.  If you are in your 20s or 30s, you should have a clinical breast exam (CBE) by a health care provider every 1-3 years as part of a regular health exam.  If you are 4 or older, have a CBE every year. Also consider having a breast X-ray (mammogram) every year.  If you have a family history of breast cancer, talk to your health care provider about genetic screening.  If you are at high risk for breast cancer, talk to your health care provider about having an MRI and a mammogram every year.  Breast cancer gene (BRCA) assessment is recommended for women who have family members with BRCA-related cancers. BRCA-related cancers  include:  Breast.  Ovarian.  Tubal.  Peritoneal cancers.  Results of the assessment will determine the need for genetic counseling and BRCA1 and BRCA2 testing. Cervical Cancer Routine pelvic examinations to screen for cervical cancer are no longer recommended for nonpregnant women who are considered low risk for cancer of the pelvic organs (ovaries, uterus, and vagina) and who do not have symptoms. A pelvic examination may be necessary if you have symptoms including those associated with pelvic infections. Ask your health care provider if a screening pelvic exam is right for you.   The Pap test is the screening test for cervical cancer for women who are considered at risk.  If you had a hysterectomy for a problem that was not cancer or a condition that could lead to cancer, then you no longer need Pap tests.  If you are  older than 65 years, and you have had normal Pap tests for the past 10 years, you no longer need to have Pap tests.  If you have had past treatment for cervical cancer or a condition that could lead to cancer, you need Pap tests and screening for cancer for at least 20 years after your treatment.  If you no longer get a Pap test, assess your risk factors if they change (such as having a new sexual partner). This can affect whether you should start being screened again.  Some women have medical problems that increase their chance of getting cervical cancer. If this is the case for you, your health care provider may recommend more frequent screening and Pap tests.  The human papillomavirus (HPV) test is another test that may be used for cervical cancer screening. The HPV test looks for the virus that can cause cell changes in the cervix. The cells collected during the Pap test can be tested for HPV.  The HPV test can be used to screen women 15 years of age and older. Getting tested for HPV can extend the interval between normal Pap tests from three to five years.  An HPV  test also should be used to screen women of any age who have unclear Pap test results.  After 71 years of age, women should have HPV testing as often as Pap tests.  Colorectal Cancer  This type of cancer can be detected and often prevented.  Routine colorectal cancer screening usually begins at 71 years of age and continues through 71 years of age.  Your health care provider may recommend screening at an earlier age if you have risk factors for colon cancer.  Your health care provider may also recommend using home test kits to check for hidden blood in the stool.  A small camera at the end of a tube can be used to examine your colon directly (sigmoidoscopy or colonoscopy). This is done to check for the earliest forms of colorectal cancer.  Routine screening usually begins at age 33.  Direct examination of the colon should be repeated every 5-10 years through 71 years of age. However, you may need to be screened more often if early forms of precancerous polyps or small growths are found. Skin Cancer  Check your skin from head to toe regularly.  Tell your health care provider about any new moles or changes in moles, especially if there is a change in a mole's shape or color.  Also tell your health care provider if you have a mole that is larger than the size of a pencil eraser.  Always use sunscreen. Apply sunscreen liberally and repeatedly throughout the day.  Protect yourself by wearing long sleeves, pants, a wide-brimmed hat, and sunglasses whenever you are outside. HEART DISEASE, DIABETES, AND HIGH BLOOD PRESSURE   Have your blood pressure checked at least every 1-2 years. High blood pressure causes heart disease and increases the risk of stroke.  If you are between 76 years and 16 years old, ask your health care provider if you should take aspirin to prevent strokes.  Have regular diabetes screenings. This involves taking a blood sample to check your fasting blood sugar  level.  If you are at a normal weight and have a low risk for diabetes, have this test once every three years after 71 years of age.  If you are overweight and have a high risk for diabetes, consider being tested at a younger age or more often.  PREVENTING INFECTION  Hepatitis B  If you have a higher risk for hepatitis B, you should be screened for this virus. You are considered at high risk for hepatitis B if:  You were born in a country where hepatitis B is common. Ask your health care provider which countries are considered high risk.  Your parents were born in a high-risk country, and you have not been immunized against hepatitis B (hepatitis B vaccine).  You have HIV or AIDS.  You use needles to inject street drugs.  You live with someone who has hepatitis B.  You have had sex with someone who has hepatitis B.  You get hemodialysis treatment.  You take certain medicines for conditions, including cancer, organ transplantation, and autoimmune conditions. Hepatitis C  Blood testing is recommended for:  Everyone born from [birth year] through 1967.  Anyone with known risk factors for hepatitis C. Sexually transmitted infections (STIs)  You should be screened for sexually transmitted infections (STIs) including gonorrhea and chlamydia if:  You are sexually active and are younger than 71 years of age.  You are older than 70 years of age and your health care provider tells you that you are at risk for this type of infection.  Your sexual activity has changed since you were last screened and you are at an increased risk for chlamydia or gonorrhea. Ask your health care provider if you are at risk.  If you do not have HIV, but are at risk, it may be recommended that you take a prescription medicine daily to prevent HIV infection. This is called pre-exposure prophylaxis (PrEP). You are considered at risk if:  You are sexually active and do not regularly use condoms or know the HIV status  of your partner(s).  You take drugs by injection.  You are sexually active with a partner who has HIV. Talk with your health care provider about whether you are at high risk of being infected with HIV. If you choose to begin PrEP, you should first be tested for HIV. You should then be tested every 3 months for as long as you are taking PrEP.  PREGNANCY   If you are premenopausal and you may become pregnant, ask your health care provider about preconception counseling.  If you may become pregnant, take 400 to 800 micrograms (mcg) of folic acid every day.  If you want to prevent pregnancy, talk to your health care provider about birth control (contraception). OSTEOPOROSIS AND MENOPAUSE   Osteoporosis is a disease in which the bones lose minerals and strength with aging. This can result in serious bone fractures. Your risk for osteoporosis can be identified using a bone density scan.  If you are 39 years of age or older, or if you are at risk for osteoporosis and fractures, ask your health care provider if you should be screened.  Ask your health care provider whether you should take a calcium or vitamin D supplement to lower your risk for osteoporosis.  Menopause may have certain physical symptoms and risks.  Hormone replacement therapy may reduce some of these symptoms and risks. Talk to your health care provider about whether hormone replacement therapy is right for you.  HOME CARE INSTRUCTIONS   Schedule regular health, dental, and eye exams.  Stay current with your immunizations.   Do not use any tobacco products including cigarettes, chewing tobacco, or electronic cigarettes.  If you are pregnant, do not drink alcohol.  If you are breastfeeding, limit how much and how often  you drink alcohol.  Limit alcohol intake to no more than 1 drink per day for nonpregnant women. One drink equals 12 ounces of beer, 5 ounces of wine, or 1 ounces of hard liquor.  Do not use street  drugs.  Do not share needles.  Ask your health care provider for help if you need support or information about quitting drugs.  Tell your health care provider if you often feel depressed.  Tell your health care provider if you have ever been abused or do not feel safe at home. Document Released: 09/03/2010 Document Revised: 07/05/2013 Document Reviewed: 01/20/2013 [facility] Patient Information 2015 [place], [place]. This information is not intended to replace advice given to you by your health care provider. Make sure you discuss any questions you have with your health care provider.

## 2014-10-12 ENCOUNTER — Other Ambulatory Visit: Payer: Self-pay | Admitting: Gynecology

## 2014-10-12 ENCOUNTER — Other Ambulatory Visit: Payer: Self-pay | Admitting: Critical Care Medicine

## 2014-10-17 ENCOUNTER — Telehealth: Payer: Self-pay | Admitting: Critical Care Medicine

## 2014-10-17 NOTE — Telephone Encounter (Signed)
ATC fast busy signal WCB 

## 2014-10-18 MED ORDER — DOXYCYCLINE HYCLATE 100 MG PO TABS: 100.0000 mg | ORAL_TABLET | Freq: Two times a day (BID) | ORAL | Status: DC

## 2014-10-18 NOTE — Telephone Encounter (Signed)
Called and spoke to pt. Informed her of the recs per PW. Rx sent to preferred pharmacy. Pt verbalized understanding and denied any further questions or concerns at this time.

## 2014-10-18 NOTE — Telephone Encounter (Signed)
Called spoke with pt. She is going on a trip at end of September and she is wanting an abx called in to have on hand in case she needs this. Please advise Dr. Joya Gaskins thanks

## 2014-10-18 NOTE — Telephone Encounter (Signed)
Patient is returning call [phone].

## 2014-10-18 NOTE — Telephone Encounter (Signed)
Ok for doxycycline 100mg  bid x 5 days #10 for trip

## 2014-10-18 NOTE — Telephone Encounter (Signed)
lmomtcb x1 

## 2014-10-19 ENCOUNTER — Encounter (HOSPITAL_COMMUNITY): Payer: Self-pay | Admitting: Emergency Medicine

## 2014-10-19 DIAGNOSIS — F419 Anxiety disorder, unspecified: Secondary | ICD-10-CM | POA: Diagnosis not present

## 2014-10-19 DIAGNOSIS — J984 Other disorders of lung: Secondary | ICD-10-CM | POA: Diagnosis not present

## 2014-10-19 DIAGNOSIS — Z8619 Personal history of other infectious and parasitic diseases: Secondary | ICD-10-CM | POA: Insufficient documentation

## 2014-10-19 DIAGNOSIS — J45909 Unspecified asthma, uncomplicated: Secondary | ICD-10-CM | POA: Diagnosis not present

## 2014-10-19 DIAGNOSIS — Z79899 Other long term (current) drug therapy: Secondary | ICD-10-CM | POA: Insufficient documentation

## 2014-10-19 DIAGNOSIS — Z8742 Personal history of other diseases of the female genital tract: Secondary | ICD-10-CM | POA: Diagnosis not present

## 2014-10-19 DIAGNOSIS — Z792 Long term (current) use of antibiotics: Secondary | ICD-10-CM | POA: Insufficient documentation

## 2014-10-19 DIAGNOSIS — K5909 Other constipation: Secondary | ICD-10-CM | POA: Diagnosis not present

## 2014-10-19 DIAGNOSIS — I10 Essential (primary) hypertension: Secondary | ICD-10-CM | POA: Insufficient documentation

## 2014-10-19 DIAGNOSIS — M1990 Unspecified osteoarthritis, unspecified site: Secondary | ICD-10-CM | POA: Insufficient documentation

## 2014-10-19 DIAGNOSIS — Z720 Tobacco use: Secondary | ICD-10-CM | POA: Insufficient documentation

## 2014-10-19 DIAGNOSIS — Z7951 Long term (current) use of inhaled steroids: Secondary | ICD-10-CM | POA: Insufficient documentation

## 2014-10-19 DIAGNOSIS — Z86010 Personal history of colonic polyps: Secondary | ICD-10-CM | POA: Insufficient documentation

## 2014-10-19 DIAGNOSIS — K5900 Constipation, unspecified: Secondary | ICD-10-CM | POA: Diagnosis not present

## 2014-10-19 NOTE — ED Notes (Addendum)
C/o constipation x 1 week.  States she has had very small bowel movements.  C/o lower abd pain tonight and chills.  Using laxatives and enema x 1 without relief.  Denies nausea and vomiting.  HR 127 - history of afib

## 2014-10-20 ENCOUNTER — Emergency Department (HOSPITAL_COMMUNITY): Payer: Medicare PPO

## 2014-10-20 ENCOUNTER — Emergency Department (HOSPITAL_COMMUNITY)
Admission: EM | Admit: 2014-10-20 | Discharge: 2014-10-20 | Disposition: A | Discharge status: Home / Self Care | Payer: Medicare PPO | Attending: Emergency Medicine | Admitting: Emergency Medicine

## 2014-10-20 DIAGNOSIS — K5909 Other constipation: Secondary | ICD-10-CM

## 2014-10-20 DIAGNOSIS — K5900 Constipation, unspecified: Secondary | ICD-10-CM | POA: Diagnosis not present

## 2014-10-20 DIAGNOSIS — J984 Other disorders of lung: Secondary | ICD-10-CM | POA: Diagnosis not present

## 2014-10-20 LAB — URINALYSIS, ROUTINE W REFLEX MICROSCOPIC
Bilirubin Urine: NEGATIVE
Glucose, UA: NEGATIVE mg/dL
Hgb urine dipstick: NEGATIVE
Ketones, ur: NEGATIVE mg/dL
Leukocytes, UA: NEGATIVE
Nitrite: NEGATIVE
Protein, ur: NEGATIVE mg/dL
Specific Gravity, Urine: 1.007 (ref 1.005–1.030)
Urobilinogen, UA: 0.2 mg/dL (ref 0.0–1.0)
pH: 6.5 (ref 5.0–8.0)

## 2014-10-20 LAB — LIPASE, BLOOD: Lipase: 63 U/L — ABNORMAL HIGH (ref 22–51)

## 2014-10-20 LAB — COMPREHENSIVE METABOLIC PANEL
ALT: 15 U/L (ref 14–54)
AST: 18 U/L (ref 15–41)
Albumin: 4.1 g/dL (ref 3.5–5.0)
Alkaline Phosphatase: 83 U/L (ref 38–126)
Anion gap: 9 (ref 5–15)
BUN: 8 mg/dL (ref 6–20)
CO2: 24 mmol/L (ref 22–32)
Calcium: 9.4 mg/dL (ref 8.9–10.3)
Chloride: 108 mmol/L (ref 101–111)
Creatinine, Ser: 0.74 mg/dL (ref 0.44–1.00)
GFR calc Af Amer: >60 mL/min (ref >60)
GFR calc non Af Amer: >60 mL/min (ref >60)
Glucose, Bld: 100 mg/dL — ABNORMAL HIGH (ref 65–99)
Potassium: 3.9 mmol/L (ref 3.5–5.1)
Sodium: 141 mmol/L (ref 135–145)
Total Bilirubin: 0.3 mg/dL (ref 0.3–1.2)
Total Protein: 7.1 g/dL (ref 6.5–8.1)

## 2014-10-20 LAB — CBC
HCT: 43.5 % (ref 36.0–46.0)
Hemoglobin: 14.1 g/dL (ref 12.0–15.0)
MCH: 29.8 pg (ref 26.0–34.0)
MCHC: 32.4 g/dL (ref 30.0–36.0)
MCV: 92 fL (ref 78.0–100.0)
Platelets: 233 10*3/uL (ref 150–400)
RBC: 4.73 MIL/uL (ref 3.87–5.11)
RDW: 13.7 % (ref 11.5–15.5)
WBC: 10 10*3/uL (ref 4.0–10.5)

## 2014-10-20 MED ORDER — DICYCLOMINE HCL 20 MG PO TABS: 20.0000 mg | ORAL_TABLET | Freq: Three times a day (TID) | ORAL | Status: DC

## 2014-10-20 MED ORDER — ACETAMINOPHEN 500 MG PO TABS
1000.0000 mg | ORAL_TABLET | Freq: Once | ORAL | Status: AC
  Administered 2014-10-20: 1000 mg via ORAL
  Filled 2014-10-20: qty 2

## 2014-10-20 MED ORDER — DICYCLOMINE HCL 20 MG PO TABS
20.0000 mg | ORAL_TABLET | Freq: Once | ORAL | Status: AC
  Administered 2014-10-20: 20 mg via ORAL
  Filled 2014-10-20: qty 1

## 2014-10-20 MED ORDER — ONDANSETRON 4 MG PO TBDP: 4.0000 mg | ORAL_TABLET | Freq: Three times a day (TID) | ORAL | Status: DC | PRN

## 2014-10-20 MED ORDER — ONDANSETRON 4 MG PO TBDP
4.0000 mg | ORAL_TABLET | Freq: Once | ORAL | Status: AC
  Administered 2014-10-20: 4 mg via ORAL
  Filled 2014-10-20: qty 1

## 2014-10-20 NOTE — ED Provider Notes (Signed)
This chart was scribed for [place], DO by Irene Pap, ED Scribe. This patient was seen in room A02C/A02C and patient care was started at 12:57 AM.   TIME SEEN: 12:57 AM  CHIEF COMPLAINT: constipation  HPI:  Elizabeth Mcconnell is a 69 y.o. female with hx of AFib on Eliquis, asthma, HTN who presents to the Emergency Department complaining of constipation onset one week ago. Reports associated lower abdominal pain, intermittent nausea, and chills onset earlier this evening. States that she has been taking Miralax and Senekot intermittently to some relief with some small bowel movements, 4-5 episodes today, but has not had a normal BM in over a week. She states that she had a larger bowel movement yesterday at 6 PM. She is passing gas. Reports that she stopped taking the Miralax but has continued with Senekot. She states that she started taking Colace last night x2 and one tonight. States that at 9 PM she began to have sharp, pressured abdominal pain. Used an enema to no relief. Pt reports hx of constipation, but states that this is more severe than past episodes. States she is passing gas. Denies having a regular gastroenterologist; states Dr. Maurene Capes did her colonoscopy. States she had similar symptoms in October 2015 after a colonoscopy and was diagnosed with colitis versus diverticulitis. States she was unable to take one of the antibiotics because it caused her to feel nauseous. Husband reports that she had constipation and abdominal pain for several weeks and it slowly resolved on its own. No prior history of bowel obstruction. No prior history of abdominal surgery.  PCP: [name], MD  ROS: See HPI Constitutional: no fever  Eyes: no drainage  ENT: no runny nose   Cardiovascular:  no chest pain  Resp: no SOB  GI: no vomiting GU: no dysuria Integumentary: no rash  Allergy: no hives  Musculoskeletal: no leg swelling  Neurological: no slurred speech ROS otherwise negative  PAST  MEDICAL HISTORY/PAST SURGICAL HISTORY:  Past Medical History  Diagnosis Date  . Tobacco abuse   . Allergic rhinitis   . Oral candidiasis   . Atrial flutter     failed cardioversion 11/2012; s/p ablation 02-02-2013 by Dr Rayann Heman  . Asthma   . Pneumothorax on left 1996    left lower lung collapse s/p resection  . Arthritis   . Osteopenia 08/2013     T score -2.3 FRAX 17%/3.0% stable from prior DEXA  . ALLERGIC RHINITIS   . ANXIETY   . Endometriosis   . Adenomatous polyp of colon 10/2013    f/u colo 10/2018  . Atrial fibrillation with RVR     on Holter 06/03/14  . Essential hypertension 05/30/2014    MEDICATIONS:  Prior to Admission medications   Medication Sig Start Date End Date Taking? Authorizing Provider  acetaminophen (TYLENOL) 650 MG CR tablet Take 650 mg by mouth at bedtime. Second dose during day if needed    Historical Provider, MD  ALPRAZolam (XANAX) 0.25 MG tablet Take 1 tablet (0.25 mg total) by mouth 2 (two) times daily as needed for anxiety or sleep. 06/13/14   Janith Lima, MD  apixaban (ELIQUIS) 5 MG TABS tablet Take 5 mg by mouth 2 (two) times daily.    Historical Provider, MD  CARTIA XT 240 MG 24 hr capsule TAKE 1 CAPSULE BY MOUTH AT BEDTIME 09/27/14   Isaiah Serge, NP  doxycycline (VIBRA-TABS) 100 MG tablet Take 1 tablet (100 mg total) by mouth 2 (two)  times daily. 10/18/14   Elsie Stain, MD  estradiol (VIVELLE-DOT) 0.05 MG/24HR patch Place 1 patch (0.05 mg total) onto the skin 2 (two) times a week. Patient not taking: Reported on 09/30/2014 08/02/14   [name], MD  estradiol (VIVELLE-DOT) 0.075 MG/24HR PLACE ONE PATCH onto THE SKIN TWICE A WEEK 08/09/14   [name], MD  fluticasone (FLONASE) 50 MCG/ACT nasal spray Place 2 sprays into both nostrils twice daily. 06/09/14   Elsie Stain, MD  HYDROcodone-homatropine (HYDROMET) 5-1.5 MG/5ML syrup Take 5 mLs by mouth at bedtime as needed for cough. Patient not taking: Reported on 08/31/2014 08/08/14    Melvenia Needles, NP  levalbuterol [facility] HFA) 45 MCG/ACT inhaler Inhale 1-2 puffs into the lungs every 4 (four) hours as needed for shortness of breath.    Historical Provider, MD  Melatonin 3 MG TABS Take 1 tablet by mouth daily as needed. For sleep    Historical Provider, MD  mometasone-formoterol (DULERA) 200-5 MCG/ACT AERO Inhale 2 puffs into the lungs 2 (two) times daily. 05/30/14   Elsie Stain, MD  montelukast (SINGULAIR) 10 MG tablet TAKE ONE TABLET BY MOUTH EVERY DAY 10/12/14   Elsie Stain, MD    ALLERGIES:  Allergies  Allergen Reactions  . Tramadol Other (See Comments)    Severe dizziness, weakness  . Amoxicillin-Pot Clavulanate Nausea And Vomiting  . Flecainide     dizziness  . Avelox [Moxifloxacin] Hives, Itching and Rash    SOCIAL HISTORY:  Social History  Substance Use Topics  . Smoking status: Current Some Day Smoker -- 0.00 packs/day for 20 years  . Smokeless tobacco: Never Used  . Alcohol Use: 8.4 oz/week    14 Standard drinks or equivalent per week     Comment: social    FAMILY HISTORY: Family History  Problem Relation Age of Onset  . Stroke Father 48  . Hypertension Father   . Heart disease Father   . Diabetes Mother   . Colon cancer Neg Hx   . Rectal cancer Neg Hx   . Stomach cancer Neg Hx     EXAM: BP 153/100 mmHg  Pulse 127  Temp(Src) 98 F (36.7 C) (Oral)  Resp 20  SpO2 97%  CONSTITUTIONAL: Alert and oriented and responds appropriately to questions. Appears in no distress, afebrile, nontoxic HEAD: Normocephalic EYES: Conjunctivae clear, PERRL ENT: normal nose; no rhinorrhea; moist mucous membranes; pharynx without lesions noted NECK: Supple, no meningismus, no LAD  CARD: Regular and tachycardic; S1 and S2 appreciated; no murmurs, no clicks, no rubs, no gallops RESP: Normal chest excursion without splinting or tachypnea; breath sounds clear and equal bilaterally; no wheezes, no rhonchi, no rales, no hypoxia or respiratory distress,  speaking full sentences ABD/GI: Normal bowel sounds; non-distended; soft; diffuse tenderness to the lower abdomen, no rebound, no guarding, no peritoneal signs RECTAL: No gross blood, no melena, normal rectal tone, no stool in the rectal vault BACK:  The back appears normal and is non-tender to palpation, there is no CVA tenderness EXT: Normal ROM in all joints; non-tender to palpation; no edema; normal capillary refill; no cyanosis, no calf tenderness or swelling    SKIN: Normal color for age and race; warm NEURO: Moves all extremities equally, sensation to light touch intact diffusely, cranial nerves II through XII intact PSYCH: The patient's mood and manner are appropriate. Grooming and personal hygiene are appropriate.  MEDICAL DECISION MAKING: Patient here with complaints of intermittent constipation. She is tachycardic in the  emergency department but otherwise hemodynamically stable and afebrile. No stool in the rectal vault, no fecal impaction. Labs unremarkable other than mildly elevated lipase and she has had the past. Will obtain acute abdominal series, give Bentyl, Zofran, Tylenol and encourage oral fluids. Abdominal exam is relatively benign.  ED PROGRESS: Patient's vital signs have improved. She reports feeling much better after above medications. Drinking without difficulty. X-ray shows moderate constipation without obstruction. No free air. I feel she is safe to be discharged home. Have advised her to continue MiraLAX and Colace, increase water and fiber intake and follow-up with her primary care physician. Discussed usual and customary return precautions. She verbalized understanding and is comfortable with this plan.     EKG Interpretation  Date/Time:  Wednesday October 19 2014 23:53:56 EDT Ventricular Rate:  134 PR Interval:  216 QRS Duration: 88 QT Interval:  328 QTC Calculation: 489 R Axis:   34 Text Interpretation:  Sinus tachycardia with 1st degree A-V block Low voltage  QRS Marked ST abnormality, possible inferior subendocardial injury Abnormal ECG No significant change since last tracing other than rate is faster Confirmed by Kamea Dacosta,  DO, [place] ([id number]) on 10/20/2014 12:55:51 AM        I personally performed the services described in this documentation, which was scribed in my presence. The recorded information has been reviewed and is accurate.    [place], DO 10/20/14 0230

## 2014-10-20 NOTE — Discharge Instructions (Signed)
I recommended you increase your water and fiber intake. A few cannot even of foods that are high in fiber and may use Metamucil over-the-counter once a day. I also recommend that while you are constipated you use MiraLAX 3 times a day and Colace 100 mg twice a day. You may decrease your MiraLAX to one time a day when your stools become soft and you're having regular bowel movements again. I recommend you continue this bowel regimen every day to prevent developing constipation. You may hold these medications if you began having diarrhea.   Constipation Constipation is when a person has fewer than three bowel movements a week, has difficulty having a bowel movement, or has stools that are dry, hard, or larger than normal. As people grow older, constipation is more common. If you try to fix constipation with medicines that make you have a bowel movement (laxatives), the problem may get worse. Long-term laxative use may cause the muscles of the colon to become weak. A low-fiber diet, not taking in enough fluids, and taking certain medicines may make constipation worse.  CAUSES   Certain medicines, such as antidepressants, pain medicine, iron supplements, antacids, and water pills.   Certain diseases, such as diabetes, irritable bowel syndrome (IBS), thyroid disease, or depression.   Not drinking enough water.   Not eating enough fiber-rich foods.   Stress or travel.   Lack of physical activity or exercise.   Ignoring the urge to have a bowel movement.   Using laxatives too much.  SIGNS AND SYMPTOMS   Having fewer than three bowel movements a week.   Straining to have a bowel movement.   Having stools that are hard, dry, or larger than normal.   Feeling full or bloated.   Pain in the lower abdomen.   Not feeling relief after having a bowel movement.  DIAGNOSIS  Your health care provider will take a medical history and perform a physical exam. Further testing may be done  for severe constipation. Some tests may include:  A barium enema X-ray to examine your rectum, colon, and, sometimes, your small intestine.   A sigmoidoscopy to examine your lower colon.   A colonoscopy to examine your entire colon. TREATMENT  Treatment will depend on the severity of your constipation and what is causing it. Some dietary treatments include drinking more fluids and eating more fiber-rich foods. Lifestyle treatments may include regular exercise. If these diet and lifestyle recommendations do not help, your health care provider may recommend taking over-the-counter laxative medicines to help you have bowel movements. Prescription medicines may be prescribed if over-the-counter medicines do not work.  HOME CARE INSTRUCTIONS   Eat foods that have a lot of fiber, such as fruits, vegetables, whole grains, and beans.  Limit foods high in fat and processed sugars, such as french fries, hamburgers, cookies, candies, and soda.   A fiber supplement may be added to your diet if you cannot get enough fiber from foods.   Drink enough fluids to keep your urine clear or pale yellow.   Exercise regularly or as directed by your health care provider.   Go to the restroom when you have the urge to go. Do not hold it.   Only take over-the-counter or prescription medicines as directed by your health care provider. Do not take other medicines for constipation without talking to your health care provider first.  [place] IF:   You have bright red blood in your stool.   Your  constipation lasts for more than 4 days or gets worse.   You have abdominal or rectal pain.   You have thin, pencil-like stools.   You have unexplained weight loss. MAKE SURE YOU:   Understand these instructions.  Will watch your condition.  Will get help right away if you are not doing well or get worse. Document Released: 11/17/2003 Document Revised: 02/23/2013 Document Reviewed:  11/30/2012 [facility] Patient Information 2015 [place], [place]. This information is not intended to replace advice given to you by your health care provider. Make sure you discuss any questions you have with your health care provider.   High-Fiber Diet Fiber is found in fruits, vegetables, and grains. A high-fiber diet encourages the addition of more whole grains, legumes, fruits, and vegetables in your diet. The recommended amount of fiber for adult males is 38 g per day. For adult females, it is 25 g per day. Pregnant and lactating women should get 28 g of fiber per day. If you have a digestive or bowel problem, ask your caregiver for advice before adding high-fiber foods to your diet. Eat a variety of high-fiber foods instead of only a select few type of foods.  PURPOSE  To increase stool bulk.  To make bowel movements more regular to prevent constipation.  To lower cholesterol.  To prevent overeating. WHEN IS THIS DIET USED?  It may be used if you have constipation and hemorrhoids.  It may be used if you have uncomplicated diverticulosis (intestine condition) and irritable bowel syndrome.  It may be used if you need help with weight management.  It may be used if you want to add it to your diet as a protective measure against atherosclerosis, diabetes, and cancer. SOURCES OF FIBER  Whole-grain breads and cereals.  Fruits, such as apples, oranges, bananas, berries, prunes, and pears.  Vegetables, such as green peas, carrots, sweet potatoes, beets, broccoli, cabbage, spinach, and artichokes.  Legumes, such split peas, soy, lentils.  Almonds. FIBER CONTENT IN FOODS Starches and Grains / Dietary Fiber (g)  Cheerios, 1 cup / 3 g  Corn Flakes cereal, 1 cup / 0.7 g  Rice crispy treat cereal, 1 cup / 0.3 g  Instant oatmeal (cooked),  cup / 2 g  Frosted wheat cereal, 1 cup / 5.1 g  Brown, long-grain rice (cooked), 1 cup / 3.5 g  White, long-grain rice (cooked), 1 cup /  0.6 g  Enriched macaroni (cooked), 1 cup / 2.5 g Legumes / Dietary Fiber (g)  Baked beans (canned, plain, or vegetarian),  cup / 5.2 g  Kidney beans (canned),  cup / 6.8 g  Pinto beans (cooked),  cup / 5.5 g Breads and Crackers / Dietary Fiber (g)  Plain or honey graham crackers, 2 squares / 0.7 g  Saltine crackers, 3 squares / 0.3 g  Plain, salted pretzels, 10 pieces / 1.8 g  Whole-wheat bread, 1 slice / 1.9 g  White bread, 1 slice / 0.7 g  Raisin bread, 1 slice / 1.2 g  Plain bagel, 3 oz / 2 g  Flour tortilla, 1 oz / 0.9 g  Corn tortilla, 1 small / 1.5 g  Hamburger or hotdog bun, 1 small / 0.9 g Fruits / Dietary Fiber (g)  Apple with skin, 1 medium / 4.4 g  Sweetened applesauce,  cup / 1.5 g  Banana,  medium / 1.5 g  Grapes, 10 grapes / 0.4 g  Orange, 1 small / 2.3 g  Raisin, 1.5 oz / 1.6  g  Melon, 1 cup / 1.4 g Vegetables / Dietary Fiber (g)  Green beans (canned),  cup / 1.3 g  Carrots (cooked),  cup / 2.3 g  Broccoli (cooked),  cup / 2.8 g  Peas (cooked),  cup / 4.4 g  Mashed potatoes,  cup / 1.6 g  Lettuce, 1 cup / 0.5 g  Corn (canned),  cup / 1.6 g  Tomato,  cup / 1.1 g Document Released: 02/18/2005 Document Revised: 08/20/2011 Document Reviewed: 05/23/2011 [employer] Patient Information 2015 [place], [place]. This information is not intended to replace advice given to you by your health care provider. Make sure you discuss any questions you have with your health care provider.

## 2014-10-21 ENCOUNTER — Other Ambulatory Visit (INDEPENDENT_AMBULATORY_CARE_PROVIDER_SITE_OTHER): Payer: Medicare PPO

## 2014-10-21 ENCOUNTER — Encounter: Payer: Self-pay | Admitting: Internal Medicine

## 2014-10-21 ENCOUNTER — Ambulatory Visit (INDEPENDENT_AMBULATORY_CARE_PROVIDER_SITE_OTHER)
Admission: RE | Admit: 2014-10-21 | Discharge: 2014-10-21 | Disposition: A | Discharge status: Home / Self Care | Payer: Medicare PPO | Source: Ambulatory Visit | Attending: Internal Medicine | Admitting: Internal Medicine

## 2014-10-21 ENCOUNTER — Ambulatory Visit (INDEPENDENT_AMBULATORY_CARE_PROVIDER_SITE_OTHER): Payer: Medicare PPO | Admitting: Internal Medicine

## 2014-10-21 VITALS — BP 136/62 | HR 146 | Temp 98.2°F | Ht 65.0 in | Wt 147.0 lb

## 2014-10-21 DIAGNOSIS — R1032 Left lower quadrant pain: Secondary | ICD-10-CM

## 2014-10-21 DIAGNOSIS — R310 Gross hematuria: Secondary | ICD-10-CM | POA: Insufficient documentation

## 2014-10-21 DIAGNOSIS — I10 Essential (primary) hypertension: Secondary | ICD-10-CM

## 2014-10-21 DIAGNOSIS — K921 Melena: Secondary | ICD-10-CM | POA: Diagnosis not present

## 2014-10-21 LAB — URINALYSIS, ROUTINE W REFLEX MICROSCOPIC
Hgb urine dipstick: NEGATIVE
Leukocytes, UA: NEGATIVE
Nitrite: NEGATIVE
Specific Gravity, Urine: 1.025 (ref 1.000–1.030)
Urine Glucose: NEGATIVE
Urobilinogen, UA: 1 (ref 0.0–1.0)
pH: 5.5 (ref 5.0–8.0)

## 2014-10-21 MED ORDER — LINACLOTIDE 145 MCG PO CAPS: 145.0000 ug | ORAL_CAPSULE | Freq: Every day | ORAL | Status: DC

## 2014-10-21 MED ORDER — CEFDINIR 300 MG PO CAPS: 300.0000 mg | ORAL_CAPSULE | Freq: Two times a day (BID) | ORAL | Status: DC

## 2014-10-21 MED ORDER — KETOROLAC TROMETHAMINE 30 MG/ML IM SOLN
30.0000 mg | Freq: Once | INTRAMUSCULAR | Status: AC
  Administered 2014-10-21: 30 mg via INTRAMUSCULAR

## 2014-10-21 MED ORDER — METRONIDAZOLE 250 MG PO TABS: 250.0000 mg | ORAL_TABLET | Freq: Three times a day (TID) | ORAL | Status: DC

## 2014-10-21 MED ORDER — ONDANSETRON 4 MG PO TBDP: 4.0000 mg | ORAL_TABLET | Freq: Three times a day (TID) | ORAL | Status: DC | PRN

## 2014-10-21 NOTE — Assessment & Plan Note (Addendum)
Recent ER labs and CT abd/pelvis point to constipatoin, but pt with marked worsening episode n/v with shakes and possible chills last pm; no diverticultiis or colitis or obstruction by CT, wbc ok , afeb today but out of abundance of caution given level of pain will be toradol IM 30 mg, repeat acute abd series r/o obstruct, empiric omnicef/flagyl due to mult allergies, zofran prn,  Linzess trial, consider enema, refer GI (will need new attending as Dr Olevia Perches is retiring most likely);   Note:  Total time for pt hx, exam, review of record with pt in the room, determination of diagnoses and plan for further eval and tx is > 40 min, with over 50% spent in coordination and counseling of patient

## 2014-10-21 NOTE — Assessment & Plan Note (Signed)
One time episode small volume, ? Incidental, on eliquis, not clear how could be related to current, will check UA, refer urology, note UA neg recent ER eval- colovesical fistula seems unlikely

## 2014-10-21 NOTE — Patient Instructions (Signed)
You are given the pain shot today (toradol)  Please take all new medication as prescribed  - the 2 pill antibiotics, and Linzess as prescribed  Please continue all other medications as before, and refills have been done if requested. - the zofran  Please have the pharmacy call with any other refills you may need.  You will be contacted regarding the referral for: GI, and urology  Please keep your appointments with your specialists as you may have planned  Please go to the LAB in the Basement (turn left off the elevator) for the tests to be done today - just the urine testing today  You will be contacted by phone if any changes need to be made immediately.  Otherwise, you will receive a letter about your results with an explanation, but please check with MyChart first.  Please remember to sign up for MyChart if you have not done so, as this will be important to you in the future with finding out test results, communicating by private email, and scheduling acute appointments online when needed.

## 2014-10-21 NOTE — Progress Notes (Signed)
Pre visit review using our clinic review tool, if applicable. No additional management support is needed unless otherwise documented below in the visit note. 

## 2014-10-21 NOTE — Assessment & Plan Note (Signed)
stable overall by history and exam, recent data reviewed with pt, and pt to continue medical treatment as before,  to f/u any worsening symptoms or concerns BP Readings from Last 3 Encounters:  10/21/14 136/62  10/20/14 135/84  09/30/14 120/80

## 2014-10-21 NOTE — Progress Notes (Signed)
Subjective:    Patient ID: Elizabeth Mcconnell, female    DOB: Jul 03, 1945, 69 y.o.   MRN: [id number]  HPI  Here with c/o 2 wks constipation, eval neg for acute, advised for miralax and colace, then alst evening seemed to be worse with n/v, increased abd crampy pain, small volume BRBPR x 1 , also noted hemturia last PM as well, convinced she had colitis, zofran did not seem to help - threw up the pills, appetite is not bad and has been eating regular diet, then 3 hrs symtpoms lasts night as above.  Just feels bad "all over." No fever, but felt "freezing last night, teeth chattering", covered up in blanket.     Wt Readings from Last 3 Encounters:  10/21/14 147 lb (66.679 kg)  09/30/14 151 lb (68.493 kg)  08/31/14 149 lb (67.586 kg)  Other hx per ER eval verified as acccurate - reviewed with pt and husband. Has rx for doxy per Dr Joya Gaskins for travel soon. Also omnicef seemed to help last yr with similar, less severe symptoms. Past Medical History  Diagnosis Date  . Tobacco abuse   . Allergic rhinitis   . Oral candidiasis   . Atrial flutter     failed cardioversion 11/2012; s/p ablation 02-02-2013 by Dr Rayann Heman  . Asthma   . Pneumothorax on left 1996    left lower lung collapse s/p resection  . Arthritis   . Osteopenia 08/2013     T score -2.3 FRAX 17%/3.0% stable from prior DEXA  . ALLERGIC RHINITIS   . ANXIETY   . Endometriosis   . Adenomatous polyp of colon 10/2013    f/u colo 10/2018  . Atrial fibrillation with RVR     on Holter 06/03/14  . Essential hypertension 05/30/2014   Past Surgical History  Procedure Laterality Date  . Lung surgery  02/1965  . Cesarean section      X 2  . Appendectomy    . Total abdominal hysterectomy w/ bilateral salpingoophorectomy  1984    Endometriosis  . Cardioversion N/A 11/27/2012    Procedure: CARDIOVERSION;  Surgeon: Thayer Headings, MD;  Location: [facility] ENDOSCOPY;  Service: Cardiovascular;  Laterality: N/A;  . Ablation  02-02-2013    CTI ablation by  Dr Rayann Heman   . Abdominal hysterectomy    . Atrial flutter ablation N/A 02/02/2013    Procedure: ATRIAL FLUTTER ABLATION;  Surgeon: Coralyn Mark, MD;  Location: [facility] CATH LAB;  Service: Cardiovascular;  Laterality: N/A;    reports that she has been smoking.  She has never used smokeless tobacco. She reports that she drinks about 8.4 oz of alcohol per week. She reports that she does not use illicit drugs. family history includes Diabetes in her mother; Heart disease in her father; Hypertension in her father; Stroke (age of onset: 6) in her father. There is no history of Colon cancer, Rectal cancer, or Stomach cancer. Allergies  Allergen Reactions  . Tramadol Other (See Comments)    Severe dizziness, weakness  . Amoxicillin-Pot Clavulanate Nausea And Vomiting  . Flecainide     dizziness  . Avelox [Moxifloxacin] Hives, Itching and Rash   Current Outpatient Prescriptions on File Prior to Visit  Medication Sig Dispense Refill  . acetaminophen (TYLENOL) 650 MG CR tablet Take 650 mg by mouth at bedtime. Second dose during day if needed    . apixaban (ELIQUIS) 5 MG TABS tablet Take 5 mg by mouth 2 (two) times daily.    [name] CARTIA  XT 240 MG 24 hr capsule TAKE 1 CAPSULE BY MOUTH AT BEDTIME 30 capsule 5  . dicyclomine (BENTYL) 20 MG tablet Take 1 tablet (20 mg total) by mouth 3 (three) times daily before meals. As needed for abdominal pain, cramping 30 tablet 0  . doxycycline (VIBRA-TABS) 100 MG tablet Take 1 tablet (100 mg total) by mouth 2 (two) times daily. 10 tablet 0  . estradiol (VIVELLE-DOT) 0.075 MG/24HR PLACE ONE PATCH onto THE SKIN TWICE A WEEK 4 patch 0  . fluticasone (FLONASE) 50 MCG/ACT nasal spray Place 2 sprays into both nostrils twice daily. 16 g 6  . levalbuterol (XOPENEX HFA) 45 MCG/ACT inhaler Inhale 1-2 puffs into the lungs every 4 (four) hours as needed for shortness of breath.    . Melatonin 3 MG TABS Take 1 tablet by mouth daily as needed. For sleep    . mometasone-formoterol  (DULERA) 200-5 MCG/ACT AERO Inhale 2 puffs into the lungs 2 (two) times daily. 13 g 11  . montelukast (SINGULAIR) 10 MG tablet TAKE ONE TABLET BY MOUTH EVERY DAY 30 tablet 0  . ondansetron (ZOFRAN ODT) 4 MG disintegrating tablet Take 1 tablet (4 mg total) by mouth every 8 (eight) hours as needed for nausea or vomiting. 20 tablet 0   No current facility-administered medications on file prior to visit.   Review of Systems  Constitutional: Negative for unusual diaphoresis or night sweats HENT: Negative for ringing in ear or discharge Eyes: Negative for double vision or worsening visual disturbance.  Respiratory: Negative for choking and stridor.   Genitourinary: Negative for change in urine volume.  Musculoskeletal: Negative for other MSK pain or swelling Skin: Negative for color change and worsening wound.  Neurological: Negative for tremors and numbness other than noted  Psychiatric/Behavioral: Negative for decreased concentration or agitation other than above       Objective:   Physical Exam BP 136/62 mmHg  Pulse 146  Temp(Src) 98.2 F (36.8 C) (Oral)  Ht 5\' 5"  (1.651 m)  Wt 147 lb (66.679 kg)  BMI 24.46 kg/m2  SpO2 96% VS noted, mod pain LLQ appearing, sighs and moans Constitutional: Pt appears in no significant distress HENT: Head: NCAT.  Right Ear: External ear normal.  Left Ear: External ear normal.  Eyes: . Pupils are equal, round, and reactive to light. Conjunctivae and EOM are normal Neck: Normal range of motion. Neck supple.  Cardiovascular: Normal rate and regular rhythm.   Pulmonary/Chest: Effort normal and breath sounds without rales or wheezing.  Abd:  Soft, NT, mild distended, + BS with LLQ tender, no guarding or rebound Neurological: Pt is alert. Not confused , motor grossly intact Skin: Skin is warm. No rash, no LE edema Psychiatric: Pt behavior is normal. No agitation.     Assessment & Plan:

## 2014-10-21 NOTE — Addendum Note (Signed)
Addended by: Lyman Bishop on: 10/21/2014 10:07 AM   Modules accepted: Orders

## 2014-10-22 LAB — URINE CULTURE: Colony Count: 75000

## 2014-10-24 ENCOUNTER — Encounter: Payer: Self-pay | Admitting: Internal Medicine

## 2014-10-24 ENCOUNTER — Telehealth: Payer: Self-pay | Admitting: Internal Medicine

## 2014-10-24 NOTE — Telephone Encounter (Signed)
Spoke with pt states Flagyl and Omnicef she was prescribed by Dr. Jenny Reichmann is making her sick worse than before she came in office. Pt has a sensitivity to antibiotics. States she had similar symptoms last year (colitis)and got put on Septra DS and worked with little side effects wants to know if that or another antibiotic can be called in. Has not ate a decent meal since Wed.

## 2014-10-24 NOTE — Telephone Encounter (Signed)
Pt informed of Dr. Ronnald Ramp instruction. Pt ok'd will dc flagyl and continue omnicef and will call back if not improved.

## 2014-10-24 NOTE — Telephone Encounter (Signed)
Pt called in and said that she is not sure that she is taking meds the right way and would like a nurse to call her back

## 2014-10-24 NOTE — Telephone Encounter (Signed)
It looks like she has been on 3 different antibiotics recently. Since she can't tolerate the Flagyl I recommended she discontinue taking that. She should let us know how she responds to the discontinuation of Flagyl. I don't think the and other antibiotic is causing her symptoms so she can continue that for now.

## 2014-10-31 ENCOUNTER — Telehealth: Payer: Self-pay | Admitting: *Deleted

## 2014-10-31 ENCOUNTER — Other Ambulatory Visit: Payer: Self-pay | Admitting: Internal Medicine

## 2014-10-31 DIAGNOSIS — K5900 Constipation, unspecified: Secondary | ICD-10-CM

## 2014-10-31 NOTE — Telephone Encounter (Signed)
Pt states she wanted to let md know that she did go off the flagel, took the [facility] for about 6 days but had to come off that also. Med was making her sick on the stomach & have N/V symptoms. Feel a lot better but not 100%. Currently taking the pericolace, but she is needing md to get her set back up with [facility]. Dr. Collene Mares refuse to take her on as a new patient. Pt states she is needing to see a GI provider.Inform pt md is out of the office today will give call with his response tomorrow when he returns...[name]

## 2014-10-31 NOTE — Telephone Encounter (Signed)
GI referral ordered

## 2014-11-01 NOTE — Telephone Encounter (Signed)
Called pt spoke with husband [place] Bells) to inform wife md has place GI referral.../lmb

## 2014-11-14 ENCOUNTER — Encounter: Payer: Self-pay | Admitting: Physician Assistant

## 2014-11-14 ENCOUNTER — Ambulatory Visit (INDEPENDENT_AMBULATORY_CARE_PROVIDER_SITE_OTHER): Payer: Medicare PPO | Admitting: Physician Assistant

## 2014-11-14 VITALS — BP 110/70 | HR 84 | Ht 63.5 in | Wt 144.5 lb

## 2014-11-14 DIAGNOSIS — Z86010 Personal history of colonic polyps: Secondary | ICD-10-CM

## 2014-11-14 DIAGNOSIS — K5732 Diverticulitis of large intestine without perforation or abscess without bleeding: Secondary | ICD-10-CM

## 2014-11-14 DIAGNOSIS — K5900 Constipation, unspecified: Secondary | ICD-10-CM

## 2014-11-14 MED ORDER — LINACLOTIDE 145 MCG PO CAPS: 145.0000 ug | ORAL_CAPSULE | Freq: Every day | ORAL | Status: DC

## 2014-11-14 MED ORDER — SACCHAROMYCES BOULARDII 250 MG PO CAPS: 250.0000 mg | ORAL_CAPSULE | Freq: Two times a day (BID) | ORAL | Status: DC

## 2014-11-14 NOTE — Progress Notes (Addendum)
Patient ID: Deaisa Merida, female   DOB: Nov 17, 1945, 69 y.o.   MRN: [id number]   Subjective:    Patient ID: [name], female    DOB: October 21, 1945, 69 y.o.   MRN: [id number]  HPI  Jana Half is a pleasant 69 year old white female known to Dr. Delfin Edis previously. She has history of asthma, hypertension, atrial fib/ flutter for which she is on eliquis. She has history of diverticulosis and has had recurrent episodes of diverticulitis. She was last seen by GI in 2015 at which time she had an episode of acute diverticulitis confirmed on CT scan. Her last colonoscopy was in August 2015 just prior to this episode she had one sigmoid polyp removed which was a tubular adenoma and was noted to have severe diverticulosis of the sigmoid colon. Exline Patient says she became ill again in August and says that she felt exactly the same as she did last year at that time. Says this episode started with constipation followed by lower abdominal cramping and no bowel movement for 4-5 days. She became increasingly uncomfortable actually had an ER visit at which time her labs were unremarkable and plain films were negative. She was not treated with an antibiotic but was asked to continue her MiraLAX. The following day she felt worse had tried to get in here and was unable to and was then seen by her PCP Dr. Jenny Reichmann on 10/20/2017. It was felt that she may have diverticulitis and was given a course of Flagyl and Omnicef. She says the antibiotics made  her very sick, nauseated and she only took the Flagyl for 1-2 days and stopped the [facility] after about 5 days. She had also been given a course of Linzess 145 g for constipation and says this did seem to work well to clean her bowels out. Since then she has been using to Peri-Colace at bedtime and says she's having 2-3 bowel movements per day and feels as if her bowels are moving well. Her abdominal pain and cramping have subsided. She was very distressed about this episode  and wants to know how she can prevent further episodes and was also very upset about not being able to get in here when she was sick.  Review of Systems Pertinent positive and negative review of systems were noted in the above HPI section.  All other review of systems was otherwise negative.  Outpatient Encounter Prescriptions as of 11/14/2014  Medication Sig  . acetaminophen (TYLENOL) 650 MG CR tablet Take 650 mg by mouth at bedtime. Second dose during day if needed  . apixaban (ELIQUIS) 5 MG TABS tablet Take 5 mg by mouth 2 (two) times daily.  [name] CARTIA XT 240 MG 24 hr capsule TAKE 1 CAPSULE BY MOUTH AT BEDTIME  . estradiol (VIVELLE-DOT) 0.075 MG/24HR PLACE ONE PATCH onto THE SKIN TWICE A WEEK  . fluticasone (FLONASE) 50 MCG/ACT nasal spray Place 2 sprays into both nostrils twice daily.  [name] levalbuterol (XOPENEX HFA) 45 MCG/ACT inhaler Inhale 1-2 puffs into the lungs every 4 (four) hours as needed for shortness of breath.  . Melatonin 3 MG TABS Take 1 tablet by mouth daily as needed. For sleep  . mometasone-formoterol (DULERA) 200-5 MCG/ACT AERO Inhale 2 puffs into the lungs 2 (two) times daily.  . montelukast (SINGULAIR) 10 MG tablet TAKE ONE TABLET BY MOUTH EVERY DAY  . saccharomyces boulardii (FLORASTOR) 250 MG capsule Take 1 capsule (250 mg total) by mouth 2 (two) times daily.  Orlie Dakin Sodium (  PERI-COLACE PO) Take 1 tablet by mouth daily.  . [DISCONTINUED] saccharomyces boulardii (FLORASTOR) 250 MG capsule Take 250 mg by mouth daily.  [name] doxycycline (VIBRA-TABS) 100 MG tablet Take 1 tablet (100 mg total) by mouth 2 (two) times daily. (Patient not taking: Reported on 11/14/2014)  . Linaclotide (LINZESS) 145 MCG CAPS capsule Take 1 capsule (145 mcg total) by mouth daily.  . [DISCONTINUED] cefdinir (OMNICEF) 300 MG capsule Take 1 capsule (300 mg total) by mouth 2 (two) times daily.  . [DISCONTINUED] dicyclomine (BENTYL) 20 MG tablet Take 1 tablet (20 mg total) by mouth 3 (three)  times daily before meals. As needed for abdominal pain, cramping  . [DISCONTINUED] Linaclotide (LINZESS) 145 MCG CAPS capsule Take 1 capsule (145 mcg total) by mouth daily.  . [DISCONTINUED] metroNIDAZOLE (FLAGYL) 250 MG tablet Take 1 tablet (250 mg total) by mouth 3 (three) times daily.  . [DISCONTINUED] ondansetron (ZOFRAN ODT) 4 MG disintegrating tablet Take 1 tablet (4 mg total) by mouth every 8 (eight) hours as needed for nausea or vomiting.   No facility-administered encounter medications on file as of 11/14/2014.   Allergies  Allergen Reactions  . Tramadol Other (See Comments)    Severe dizziness, weakness  . Amoxicillin-Pot Clavulanate Nausea And Vomiting  . Flagyl [Metronidazole] Nausea And Vomiting  . Flecainide     dizziness  . Omnicef [Cefdinir] Nausea And Vomiting  . Avelox [Moxifloxacin] Hives, Itching and Rash   Patient Active Problem List   Diagnosis Date Noted  . CN (constipation) 10/31/2014  . Abdominal pain, left lower quadrant 10/21/2014  . Gross hematuria 10/21/2014  . Oral candidiasis 08/08/2014  . Hyperlipidemia with target LDL less than 160 06/13/2014  . Soft tissue mass 06/13/2014  . Atrial fibrillation with RVR   . Essential hypertension 05/30/2014  . Diverticulosis of colon without hemorrhage 12/14/2013  . BPPV (benign paroxysmal positional vertigo) 06/02/2013  . Atrial flutter 01/23/2013  . Arthritis   . GAD (generalized anxiety disorder) 08/08/2008  . ALLERGIC RHINITIS 08/08/2008  . Asthma, moderate persistent 01/15/2007   Social History   Social History  . Marital Status: Married    Spouse Name: N/A  . Number of Children: 2  . Years of Education: N/A   Occupational History  . 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     Social History Main Topics  . Smoking status: Current Some Day Smoker -- 0.00 packs/day for 20 years  . Smokeless tobacco: Never Used  . Alcohol Use: 8.4 oz/week    14 Standard drinks or equivalent per week     Comment: social    . Drug Use: No  . Sexual Activity: Yes    Birth Control/ Protection: Surgical, Post-menopausal     Comment: HYST-1st intercourse 69 yo-Fewer than 5 partners   Other Topics Concern  . Not on file   Social History Narrative   Lives with spouse;    2 children    Ms. Reier's family history includes Diabetes in her mother; Heart disease in her father; Hypertension in her father; Stroke (age of onset: 63) in her father. There is no history of Colon cancer, Rectal cancer, or Stomach cancer.      Objective:    Filed Vitals:   11/14/14 0835  BP: 110/70  Pulse: 84    Physical Exam  well-developed older white female in no acute distress, pleasant blood pressure 110/70 pulse 84 height 5 foot 3 weight 144. HEENT; nontraumatic normocephalic EOMI PERRLA sclera anicteric, Supple; no JVD,  Cardiovascular; regular rate and rhythm with S1-S2 no murmur or gallop, Pulmonary ;clear bilaterally, Abdomen ;soft nontender nondistended bowel sounds are active there is no palpable mass or hepatosplenomegaly, Rectal ;exam not done, Ext; no clubbing cyanosis or edema skin warm and dry, Neuropsych; mood and affect appropriate       Assessment & Plan:   #1 69 yo female with hx of recurrent diverticulitis with recent episode 10/2014- she only received a short course of antibiotics as could not tolerate Omnicef and flagyl  Due to nausea etc-however sxs currently much improved and exam is benign  #2 chronic constipation  #3 hx of adenomatous colon polyps- last colon 10/2013 #4 atiral Fib  #5 chronic anticoagulation- Eliquis  Plan; Pt will establish with Dr. Hilarie Fredrickson by her request  Follow up appt in 6-8 weeks Long discussion today- she will retry Linzess 146mcg po daily - if doesn't tolerate- then will go back to pericolace and prn Miralax Add Florastor po BID Liberal fluids Avoid popcorn corn ,nuts  Pt knows to call and ask to speak to triage nurse for any recurrent pain in future    Cayley Pester Genia Harold  PA-C 11/14/2014   Cc: Biagio Borg, MD   Addendum: Reviewed and agree with initial management. Jerene Bears, MD

## 2014-11-14 NOTE — Patient Instructions (Addendum)
We sent prescriptions to [facility]. 1. Florastor probiotic 2. Linzess 145 mcg.   We made you an appointment with Dr. Zenovia Jarred on 01-12-2015.   Push fluids. Avoid nuts, seeds, popcorn, corn.

## 2014-11-30 ENCOUNTER — Other Ambulatory Visit: Payer: Self-pay | Admitting: Cardiology

## 2014-12-05 ENCOUNTER — Telehealth: Payer: Self-pay | Admitting: Internal Medicine

## 2014-12-05 ENCOUNTER — Encounter: Payer: Self-pay | Admitting: Adult Health

## 2014-12-05 ENCOUNTER — Other Ambulatory Visit: Payer: Self-pay | Admitting: Critical Care Medicine

## 2014-12-05 ENCOUNTER — Ambulatory Visit (INDEPENDENT_AMBULATORY_CARE_PROVIDER_SITE_OTHER): Payer: Medicare PPO | Admitting: Adult Health

## 2014-12-05 VITALS — BP 128/84 | HR 78 | Temp 97.8°F | Ht 63.5 in | Wt 143.0 lb

## 2014-12-05 DIAGNOSIS — B370 Candidal stomatitis: Secondary | ICD-10-CM | POA: Diagnosis not present

## 2014-12-05 DIAGNOSIS — R05 Cough: Secondary | ICD-10-CM

## 2014-12-05 DIAGNOSIS — J4541 Moderate persistent asthma with (acute) exacerbation: Secondary | ICD-10-CM | POA: Diagnosis not present

## 2014-12-05 DIAGNOSIS — R059 Cough, unspecified: Secondary | ICD-10-CM

## 2014-12-05 MED ORDER — LEVALBUTEROL HCL 0.63 MG/3ML IN NEBU
0.6300 mg | INHALATION_SOLUTION | Freq: Once | RESPIRATORY_TRACT | Status: AC
  Administered 2014-12-05: 0.63 mg via RESPIRATORY_TRACT

## 2014-12-05 MED ORDER — CLOTRIMAZOLE 10 MG MT TROC: 10.0000 mg | Freq: Every day | OROMUCOSAL | Status: DC

## 2014-12-05 MED ORDER — PREDNISONE 10 MG PO TABS: ORAL_TABLET | ORAL | Status: DC

## 2014-12-05 MED ORDER — DOXYCYCLINE HYCLATE 100 MG PO TABS: 100.0000 mg | ORAL_TABLET | Freq: Two times a day (BID) | ORAL | Status: DC

## 2014-12-05 NOTE — Telephone Encounter (Signed)
Patient went on a trip, Dr. Joya Gaskins gave her medication to take during her trip.  She started getting sick last Thursday and took the Doxy and Prednisone.  Was given 5 day supply of the antibiotic, finished it yesterday, but she feels like she needs something else, she is still sick.  She wants to be seen today.  She is very congested, head stuffy...  Patient scheduled to see TP today.    Nothing further needed.

## 2014-12-05 NOTE — Assessment & Plan Note (Signed)
Advised on oral care Plan Begin Mycelex troches five times daily for 7 days for thrush  Begin yogurt daily .  Please contact office for sooner follow up if symptoms do not improve or worsen or seek emergency care  Follow up with Dr. Ashok Cordia in 6 weeks and As needed

## 2014-12-05 NOTE — Progress Notes (Signed)
   Subjective:    Patient ID: Elizabeth Mcconnell, female    DOB: 1945/08/17, 70 y.o.   MRN: [id number]  HPI 25  WF smoker  with known hx of  Severe atopic asthma with extremely high IG levels, paroxysmal atrial fibrillation felt to be secondary to albuterol, allergic rhinitis,  Prior history of recurrent spontaneous pneumothorax, with left lower lobe lobectomy.  12/05/2014 Acute OV : Atopic Asthma Presents for an acute office visit.  Complains of 5 days of  head congestion, ear fullness and pain, SOB, cough with little mucus production, minimal sinus drainage. Pt also c/o fever, denies n/v/d.  Pt states that she is taking abx/pred that were previously given to her.  She finished 5 days of Doxycycline last night .  Has few days left of prednisone. Recent cruise.  She denies any hemoptysis, chest pain, orthopnea, PND or leg swelling. Has been using Mucinex with some help. CT sinus earlier this year showed some mild maxillary mucosal thickening. Otherwise sinuses were clear. Chest x-ray in May showed chronic changes with no acute process.     Review of Systems Constitutional:   No  weight loss, night sweats,  Fevers, chills, fatigue, or  lassitude.  HEENT:   No headaches,  Difficulty swallowing,  Tooth/dental problems, or  Sore throat,                No sneezing, itching, ear ache,  +nasal congestion, post nasal drip,   CV:  No chest pain,  Orthopnea, PND, swelling in lower extremities, anasarca, dizziness, palpitations, syncope.   GI  No heartburn, indigestion, abdominal pain, nausea, vomiting, diarrhea, change in bowel habits, loss of appetite, bloody stools.   Resp:  No chest wall deformity  Skin: no rash or lesions.  GU: no dysuria, change in color of urine, no urgency or frequency.  No flank pain, no hematuria   MS:  No joint pain or swelling.  No decreased range of motion.  No back pain.  Psych:  No change in mood or affect. No depression or anxiety.  No memory  loss.         Objective:   Physical Exam  GEN: A/Ox3; pleasant , NAD, elderly   HEENT:  [address]/AT,  EACs-clear, TMs-wnl, NOSE-clear drainage  THROAT-clear, no lesions, no postnasal drip  Few scattered white patches in upper palate  c/w thrush  NECK:  Supple w/ fair ROM; no JVD; normal carotid impulses w/o bruits; no thyromegaly or nodules palpated; no lymphadenopathy.  RESP  Faint exp wheeze,  no stridor .no accessory muscle use, no dullness to percussion, speaks in full sentences   CARD:  RRR, no m/r/g  , no peripheral edema, pulses intact, no cyanosis or clubbing., neg calf pain, neg homans sign   GI:   Soft & nt; nml bowel sounds; no organomegaly or masses detected.  Musco: Warm bil, no deformities or joint swelling noted.   Neuro: alert, no focal deficits noted.    Skin: Warm, no lesions or rashes        Assessment & Plan:

## 2014-12-05 NOTE — Patient Instructions (Addendum)
Extend Doxycycline for additional 5 days  Take with food. ,wear sunscreen.  Begin Mycelex troches five times daily for 7 days for thrush  Begin yogurt daily .  Extend prednisone taper.  Mucinex Twice daily  As needed  Cough/congestion  Delsym 2 tsp Twice daily  As needed  Cough .  Work on not smoking .  Please contact office for sooner follow up if symptoms do not improve or worsen or seek emergency care  Follow up with Dr. Ashok Cordia in 6 weeks and As needed

## 2014-12-05 NOTE — Assessment & Plan Note (Signed)
Slow to resolve exacerbation with bronchitis  Plan  Extend Doxycycline for additional 5 days  Take with food. ,wear sunscreen.  Begin Mycelex troches five times daily for 7 days for thrush  Begin yogurt daily .  Extend prednisone taper.  Mucinex Twice daily  As needed  Cough/congestion  Delsym 2 tsp Twice daily  As needed  Cough .  Work on not smoking .  Please contact office for sooner follow up if symptoms do not improve or worsen or seek emergency care  Follow up with Dr. Ashok Cordia in 6 weeks and As needed

## 2014-12-09 ENCOUNTER — Other Ambulatory Visit: Payer: Self-pay | Admitting: Critical Care Medicine

## 2014-12-13 ENCOUNTER — Telehealth: Payer: Self-pay | Admitting: Physician Assistant

## 2014-12-13 NOTE — Telephone Encounter (Signed)
If she can take Cipro would start Cipro 500 mg po BID (take with Food ) x 10 days. Also if constipated start back on daily Miralax until gets through this episode.

## 2014-12-13 NOTE — Telephone Encounter (Signed)
Patient reports abdominal pain started Sunday night 12/21/14. Last night she had "terrible, terrible cramps". She is having bowel movements, but feels constipated. Afebrile. No blood with bowel movements. States "this feels exactly like it did when I had diverticulitis". She has taken a Linzess today but has not had a bowel movement. She completed a course of Doxycycline and Prednisone on Friday for treatment of URI. See last OV note. Please advise. No opening for appointment until 12/22/14.

## 2014-12-14 MED ORDER — SULFAMETHOXAZOLE-TRIMETHOPRIM 800-160 MG PO TABS: 1.0000 | ORAL_TABLET | Freq: Two times a day (BID) | ORAL | Status: DC

## 2014-12-14 NOTE — Telephone Encounter (Signed)
She is allergic to this class of atb's. Per Nicoletta Ba, PA, change the atb to Bactrim DS 1 PO BID for 10 days. Patient is advised. She is to also call back if she acutely worsens or fails to improve. Questions invited and answered.

## 2014-12-16 ENCOUNTER — Telehealth: Payer: Self-pay | Admitting: Adult Health

## 2014-12-16 MED ORDER — MONTELUKAST SODIUM 10 MG PO TABS: 10.0000 mg | ORAL_TABLET | Freq: Every day | ORAL | Status: DC

## 2014-12-16 NOTE — Telephone Encounter (Signed)
i have placed fax in TP folder up front

## 2014-12-16 NOTE — Telephone Encounter (Signed)
Called spoke with pt. Apologized for the delay in refill her Singulair. I have now refilled [facility]. She needed nothing further

## 2014-12-23 ENCOUNTER — Encounter: Payer: Self-pay | Admitting: *Deleted

## 2014-12-27 ENCOUNTER — Other Ambulatory Visit: Payer: Self-pay | Admitting: *Deleted

## 2014-12-28 ENCOUNTER — Other Ambulatory Visit: Payer: Self-pay | Admitting: Adult Health

## 2015-01-12 ENCOUNTER — Ambulatory Visit (INDEPENDENT_AMBULATORY_CARE_PROVIDER_SITE_OTHER): Payer: Medicare PPO | Admitting: Internal Medicine

## 2015-01-12 ENCOUNTER — Encounter: Payer: Self-pay | Admitting: Internal Medicine

## 2015-01-12 VITALS — BP 110/70 | HR 80 | Ht 63.5 in | Wt 142.4 lb

## 2015-01-12 DIAGNOSIS — K5900 Constipation, unspecified: Secondary | ICD-10-CM | POA: Diagnosis not present

## 2015-01-12 DIAGNOSIS — Z8719 Personal history of other diseases of the digestive system: Secondary | ICD-10-CM | POA: Diagnosis not present

## 2015-01-12 DIAGNOSIS — K5010 Crohn's disease of large intestine without complications: Secondary | ICD-10-CM

## 2015-01-12 MED ORDER — MESALAMINE 1.2 G PO TBEC: 2.4000 g | DELAYED_RELEASE_TABLET | Freq: Every day | ORAL | Status: DC

## 2015-01-12 NOTE — Patient Instructions (Signed)
We have sent the following medications to your pharmacy for you to pick up at your convenience: Lialda 2.4 grams daily  Please continue Linzess and colace.  Please follow up with Dr Hilarie Fredrickson on Monday, 03/20/15 @ 9:00 am

## 2015-01-12 NOTE — Progress Notes (Signed)
Subjective:    Patient ID: Elizabeth Mcconnell, female    DOB: 02/23/1946, 69 y.o.   MRN: [id number]  HPI Jaiden Mcconnell is a 69 year old female previous he managed by Dr. Olevia Perches with a history of diverticulitis, constipation, adenomatous colon polyps who is here for follow-up. She is here alone today. She also has a history of asthma, hypertension, A. fib on Eliquis. She has had issues with diverticulitis over the last year. In August 2016 she was treated for a diverticulitis flare with Omnicef and Flagyl. Both of these medications cause nausea and vomiting and she was unable to complete the whole course. She saw Nicoletta Ba, PA-C in September and then in October and her left lower quadrant abdominal pain returned. She was treated with Bactrim and completed this course. When she gets diverticulitis she becomes quite constipated has abdominal bloating and usually left lower quadrant abdominal pain. She is feeling better but still not quite normal. She is having some left lower quadrant soreness without true pain. No fever or chills. Good appetite. Bowel movements are occurring 2-3 times a day. At times they're loose at other times more normal. She is taking Florastor to 50 mg twice a day. Regarding her constipation she was given Linzess in August initially this caused diarrhea she stopped it but restarted it in October 2016 and it is doing okay. She is using Peri-Colace at night. Previously MiraLAX but not currently   Review of Systems As per history of present illness, otherwise negative  Current Medications, Allergies, Past Medical History, Past Surgical History, Family History and Social History were reviewed in [employer] record.     Objective:   Physical Exam BP 110/70 mmHg  Pulse 80  Ht 5' 3.5" (1.613 m)  Wt 142 lb 6.4 oz (64.592 kg)  BMI 24.83 kg/m2 Constitutional: Well-developed and well-nourished. No distress. HEENT: Normocephalic and atraumatic.  Oropharynx is clear and moist. No oropharyngeal exudate. Conjunctivae are normal.  No scleral icterus. Neck: Neck supple. Trachea midline. Cardiovascular: Normal rate, regular rhythm and intact distal pulses. No M/R/G Pulmonary/chest: Effort normal and breath sounds normal. No wheezing, rales or rhonchi. Abdominal: Soft, nontender, nondistended. Bowel sounds active throughout. There are no masses palpable. No hepatosplenomegaly. Extremities: no clubbing, cyanosis, or edema Lymphadenopathy: No cervical adenopathy noted. Neurological: Alert and oriented to person place and time. Skin: Skin is warm and dry. No rashes noted. Psychiatric: Normal mood and affect. Behavior is normal.  CBC    Component Value Date/Time   WBC 10.0 10/19/2014 0009   RBC 4.73 10/19/2014 0009   HGB 14.1 10/19/2014 0009   HCT 43.5 10/19/2014 0009   PLT 233 10/19/2014 0009   MCV 92.0 10/19/2014 0009   MCH 29.8 10/19/2014 0009   MCHC 32.4 10/19/2014 0009   RDW 13.7 10/19/2014 0009   LYMPHSABS 2.2 06/06/2014 1225   MONOABS 0.5 06/06/2014 1225   EOSABS 0.2 06/06/2014 1225   BASOSABS 0.0 06/06/2014 1225    CMP     Component Value Date/Time   NA 141 10/19/2014 0009   K 3.9 10/19/2014 0009   CL 108 10/19/2014 0009   CO2 24 10/19/2014 0009   GLUCOSE 100* 10/19/2014 0009   BUN 8 10/19/2014 0009   CREATININE 0.74 10/19/2014 0009   CALCIUM 9.4 10/19/2014 0009   PROT 7.1 10/19/2014 0009   ALBUMIN 4.1 10/19/2014 0009   AST 18 10/19/2014 0009   ALT 15 10/19/2014 0009   ALKPHOS 83 10/19/2014 0009   BILITOT 0.3 10/19/2014  0009   GFRNONAA >60 10/19/2014 0009   GFRAA >60 10/19/2014 0009    Last colonoscopy 10/27/2013, Dr. Dwain Sarna -- sigmoid colon polyp which was removed and found to be tubular adenoma. Severe diverticulosis in the sigmoid with angulation, tortuosity, muscular hypertrophy and colonic narrowing. This exam was completed to the cecum     Assessment & Plan:  69 year old female previous he  managed by Dr. Olevia Perches with a history of diverticulitis, constipation, adenomatous colon polyps who is here for follow-up.  1. Diverticulosis with history of diverticulitis/left lower quadrant pain/constipation -- we had a long discussion today regarding her symptoms. 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 I have stressed the importance of using laxatives to avoid constipation as I think this will make her symptoms worse. We also discussed surgical resection if she continues to have issues. I do not think surgery is necessary at this time. For now I will start her on Lialda 2.4 g daily for segmental colitis to see if this helps. She will continue Linzess 145 g daily and Colace at night. She will also continue her probiotic.  8-12 week follow-up, sooner if necessary  25 minutes spent with her today

## 2015-01-16 ENCOUNTER — Telehealth: Payer: Self-pay | Admitting: Cardiovascular Disease

## 2015-01-16 MED ORDER — DILTIAZEM HCL ER COATED BEADS 300 MG PO CP24: 300.0000 mg | ORAL_CAPSULE | Freq: Every day | ORAL | Status: DC

## 2015-01-16 NOTE — Telephone Encounter (Signed)
Please increase Cartia XT to 300 mg daily. Will see back next month and consider antiarrhythmic drug options.

## 2015-01-16 NOTE — Telephone Encounter (Signed)
New MEssage  Pt calling to speak w/ RN concerning the possibility of increasing her cardiazem. Please call back and discuss.

## 2015-01-16 NOTE — Telephone Encounter (Signed)
I spoke with the pt's husband and made him aware of medication change. New Rx sent to the pharmacy. I will contact the pt with an appointment in December once Dr Antionette Char schedule becomes available.

## 2015-01-16 NOTE — Telephone Encounter (Signed)
I spoke with the pt and she feels like her current dosage of Cartia XT 240mg  daily is not controlling her AFib. The pt is noticing irregular pulse a couple times per week and this even keeps her awake at night. The pt would like to know if her dosage can be increased.  I will forward this message to Dr Burt Knack for review.

## 2015-01-24 ENCOUNTER — Other Ambulatory Visit: Payer: Self-pay | Admitting: Physician Assistant

## 2015-01-30 ENCOUNTER — Telehealth: Payer: Self-pay | Admitting: Internal Medicine

## 2015-01-30 MED ORDER — SULFAMETHOXAZOLE-TRIMETHOPRIM 800-160 MG PO TABS: 1.0000 | ORAL_TABLET | Freq: Two times a day (BID) | ORAL | Status: DC

## 2015-01-30 NOTE — Telephone Encounter (Signed)
Multiple antibiotic allergies Treated successfully with Bactrim in the past for diverticulitis Restart Bactrim 1 double strength twice a day 7 days Full liquid diet Can use MiraLAX once to 2 times daily until bowels moving. Can continue Linzess To the ER if worsening May need to consider surgical referral for sigmoidectomy given her current symptoms and known severe sigmoid diverticulosis with history of diverticulitis Await response

## 2015-01-30 NOTE — Telephone Encounter (Signed)
Pt aware and script sent to pharmacy. 

## 2015-01-30 NOTE — Telephone Encounter (Signed)
Pt states she is having problems with the same symptoms. States she is constipated and having terrible stomach cramps and chills. Last BM was yesterday but she states it was not normal. Reports she took 2 colace last night, linzess and miralax this morning and has not had a BM. Pt states she was told to call right away if she started having these symptoms again. Please advise.

## 2015-02-09 ENCOUNTER — Ambulatory Visit (INDEPENDENT_AMBULATORY_CARE_PROVIDER_SITE_OTHER): Payer: Medicare PPO | Admitting: Cardiovascular Disease

## 2015-02-09 VITALS — BP 112/62 | HR 102 | Ht 65.0 in | Wt 143.1 lb

## 2015-02-09 DIAGNOSIS — I4891 Unspecified atrial fibrillation: Secondary | ICD-10-CM | POA: Diagnosis not present

## 2015-02-09 LAB — MAGNESIUM: Magnesium: 1.9 mg/dL (ref 1.5–2.5)

## 2015-02-09 LAB — BASIC METABOLIC PANEL
BUN: 12 mg/dL (ref 7–25)
CO2: 28 mmol/L (ref 20–31)
Calcium: 10.1 mg/dL (ref 8.6–10.4)
Chloride: 103 mmol/L (ref 98–110)
Creat: 0.87 mg/dL (ref 0.60–0.93)
Glucose, Bld: 107 mg/dL — ABNORMAL HIGH (ref 65–99)
Potassium: 4.4 mmol/L (ref 3.5–5.3)
Sodium: 140 mmol/L (ref 135–146)

## 2015-02-09 NOTE — Patient Instructions (Signed)
Medication Instructions:  Your physician recommends that you continue on your current medications as directed. Please refer to the Current Medication list given to you today.  Labwork: Your physician recommends that you have lab work today: BMP and Magnesium  Testing/Procedures: No new orders.   Follow-Up: Dr Burt Knack recommends [facility] admission. Please contact [facility] once you are ready to schedule [facility] admission.   Any Other Special Instructions Will Be Listed Below (If Applicable).     If you need a refill on your cardiac medications before your next appointment, please call your pharmacy.

## 2015-02-10 ENCOUNTER — Ambulatory Visit (INDEPENDENT_AMBULATORY_CARE_PROVIDER_SITE_OTHER): Payer: Medicare PPO | Admitting: Pulmonary Disease

## 2015-02-10 ENCOUNTER — Encounter: Payer: Self-pay | Admitting: Cardiovascular Disease

## 2015-02-10 ENCOUNTER — Encounter: Payer: Self-pay | Admitting: Pulmonary Disease

## 2015-02-10 ENCOUNTER — Telehealth: Payer: Self-pay | Admitting: Pulmonary Disease

## 2015-02-10 VITALS — BP 130/76 | HR 80 | Ht 63.5 in | Wt 144.8 lb

## 2015-02-10 DIAGNOSIS — J4540 Moderate persistent asthma, uncomplicated: Secondary | ICD-10-CM

## 2015-02-10 DIAGNOSIS — J309 Allergic rhinitis, unspecified: Secondary | ICD-10-CM | POA: Diagnosis not present

## 2015-02-10 DIAGNOSIS — F17200 Nicotine dependence, unspecified, uncomplicated: Secondary | ICD-10-CM | POA: Diagnosis not present

## 2015-02-10 NOTE — Patient Instructions (Signed)
1. Continue taking Singulair & inhalers as prescribed. 2. Please call me if you have any new breathing problems before your next appointment. 3. I will see you back in 6 months or sooner if you need.

## 2015-02-10 NOTE — Progress Notes (Signed)
Subjective:    Patient ID: Elizabeth Mcconnell, female    DOB: 10/16/1945, 69 y.o.   MRN: [id number]  C.C.:  Follow-up for Moderate, Persistent Asthma & Allergic Rhinitis.  HPI Moderate, Persistent Asthma: Currently prescribed Dulera 200-5 & Singulair. Reports she is compliant with medications. She reports she has had asthma since childhood. She reports she has a flare 2-3 times per year usually precipitated by respiratory illnesses. She reports she has had most of her problems in the Fall in the past. Rarely uses her Xopenex rescue inhaler. No nocturnal awakenings with coughing, wheezing, or trouble breathing. Previously was on Advair and was switched for unclear reasons. Previously has been given emergency prescriptions for Doxycycline & Prednisone. She has never been intubated with her asthma.   Allergic Rhinitis: Currently prescribed Singulair. She reports recently she has had a sore throat. She reports her baseline sinus congestion. Denies any significant pressure. Rare post-nasal drainage. Continues to take Fluticasone Nasal Spray.  Review of Systems No chest pain or pressure. Does have palpitations. No fever, chills, or sweats.  Allergies  Allergen Reactions  . Tramadol Other (See Comments)    Severe dizziness, weakness  . Amoxicillin-Pot Clavulanate Nausea And Vomiting  . Flagyl [Metronidazole] Nausea And Vomiting  . Flecainide     dizziness  . Omnicef [Cefdinir] Nausea And Vomiting  . Avelox [Moxifloxacin] Hives, Itching and Rash    Current Outpatient Prescriptions on File Prior to Visit  Medication Sig Dispense Refill  . acetaminophen (TYLENOL) 650 MG CR tablet Take 650 mg by mouth at bedtime. Second dose during day if needed    . diltiazem (CARDIZEM CD) 300 MG 24 hr capsule Take 1 capsule (300 mg total) by mouth at bedtime. 30 capsule 3  . ELIQUIS 5 MG TABS tablet TAKE 1 TABLET BY MOUTH 2 TIMES DAILY 60 tablet 5  . estradiol (VIVELLE-DOT) 0.075 MG/24HR PLACE ONE PATCH onto  THE SKIN TWICE A WEEK 4 patch 0  . fluticasone (FLONASE) 50 MCG/ACT nasal spray Place 2 sprays into both nostrils twice daily. 16 g 3  . levalbuterol (XOPENEX HFA) 45 MCG/ACT inhaler Inhale 1-2 puffs into the lungs every 4 (four) hours as needed for shortness of breath.    . Linaclotide (LINZESS) 145 MCG CAPS capsule Take 1 capsule (145 mcg total) by mouth daily. 30 capsule 2  . Melatonin 3 MG TABS Take 1 tablet by mouth daily as needed. For sleep    . mometasone-formoterol (DULERA) 200-5 MCG/ACT AERO Inhale 2 puffs into the lungs 2 (two) times daily. 13 g 11  . montelukast (SINGULAIR) 10 MG tablet Take 1 tablet (10 mg total) by mouth daily. 30 tablet 3  . saccharomyces boulardii (FLORASTOR) 250 MG capsule Take 1 capsule (250 mg total) by mouth 2 (two) times daily. 60 capsule 1  . Sennosides-Docusate Sodium (PERI-COLACE PO) Take 1 tablet by mouth daily.     No current facility-administered medications on file prior to visit.    Past Medical History  Diagnosis Date  . Tobacco abuse   . Allergic rhinitis   . Oral candidiasis   . Atrial flutter ([place])     failed cardioversion 11/2012; s/p ablation 02-02-2013 by Dr Rayann Heman  . Asthma   . Pneumothorax on left 1996    left lower lung collapse s/p resection  . Arthritis   . Osteopenia 08/2013     T score -2.3 FRAX 17%/3.0% stable from prior DEXA  . ALLERGIC RHINITIS   . ANXIETY   . Endometriosis   .  Adenomatous polyp of colon 10/2013    f/u colo 10/2018  . Atrial fibrillation with RVR ([place])     on Holter 06/03/14  . Essential hypertension 05/30/2014  . Diverticulosis   . Diverticulitis     Past Surgical History  Procedure Laterality Date  . Lung surgery Left 02/1965    Lower Lobe  . Cesarean section      X 2  . Appendectomy    . Total abdominal hysterectomy w/ bilateral salpingoophorectomy  1984    Endometriosis  . Cardioversion N/A 11/27/2012    Procedure: CARDIOVERSION;  Surgeon: Thayer Headings, MD;  Location: [facility] ENDOSCOPY;  Service:  Cardiovascular;  Laterality: N/A;  . Ablation  02-02-2013    CTI ablation by Dr Rayann Heman   . Abdominal hysterectomy    . Atrial flutter ablation N/A 02/02/2013    Procedure: ATRIAL FLUTTER ABLATION;  Surgeon: Coralyn Mark, MD;  Location: [facility] CATH LAB;  Service: Cardiovascular;  Laterality: N/A;    Family History  Problem Relation Age of Onset  . Stroke Father 50  . Hypertension Father   . Heart disease Father   . Diabetes Mother   . Colon cancer Neg Hx   . Rectal cancer Neg Hx   . Stomach cancer Neg Hx   . Asthma Maternal Grandfather   . Asthma Child     Social History   Social History  . Marital Status: Married    Spouse Name: N/A  . Number of Children: 2  . Years of Education: N/A   Occupational History  . 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     Social History Main Topics  . Smoking status: Current Some Day Smoker -- 0.00 packs/day for 20 years    Types: Cigarettes  . Smokeless tobacco: Never Used     Comment: still occas smokes  . Alcohol Use: 8.4 oz/week    14 Standard drinks or equivalent per week     Comment: daily wine  . Drug Use: No  . Sexual Activity: Yes    Birth Control/ Protection: Surgical, Post-menopausal     Comment: HYST-1st intercourse 69 yo-Fewer than 5 partners   Other Topics Concern  . None   Social History Narrative   Lives with spouse;    2 children      [facility]:   She is from [place]. 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 Previously has attended business school. Previously did office work and Programmer, applications at [employer]. She has also worked for [employer]. She does have a cat currently. She has indoor plants. No mold exposure.      Objective:   Physical Exam BP 130/76 mmHg  Pulse 80  Ht 5' 3.5" (1.613 m)  Wt 144 lb 12.8 oz (65.681 kg)  BMI 25.24 kg/m2  SpO2 98% General:  Awake. Alert. No acute distress.   Integument:  Warm & dry. No rash on exposed skin. No bruising. HEENT:  Moist  mucus membranes. No oral ulcers. No scleral injection or icterus. Minimal nasal turbinate swelling. Cardiovascular:  Regular rate. No edema. No appreciable JVD.  Pulmonary:  Good aeration & clear to auscultation bilaterally. Symmetric chest wall expansion. No accessory muscle use. Abdomen: Soft. Normal bowel sounds. Nondistended. Grossly nontender. Musculoskeletal:  Normal bulk and tone. Hand grip strength 5/5 bilaterally. Bilateral ulnar deviation of fingers as well as swan-neck deformity.  IMAGING CXR PA/LAT 08/02/14 (per radiologist): No active disease. Hyperinflation. Fibrotic changes LUL.  CARDIAC TTE (08/05/12): LV with mild basal septal hypertrophy. EF 60-65%. Normal regional wall motion. Grade 1 diastolic dysfunction. LA & RA normal in size. RV normal in size & function. No aortic stenosis or regurg. No mitral stenosis & trivial regurg. No pulmonic regurg.   LABS 02/09/15 BMP: 140/4.4/103/28/12/0.87/107/10.1 MAGNESIUM: 1.9    Assessment & Plan:  69 year old female with underlying moderate, persistent asthma. Symptomatically well controlled on her current regimen. Her allergic rhinitis is also well controlled. Physical exam findings suggestive of underlying rheumatoid arthritis and with her family history of arthritis in her mother Mrs. of certain concern. I instructed the patient to contact my office if she had any new breathing problems before her next appointment as I would be happy to see her sooner. We will need to perform breathing tests on before her next appointment to establish her underlying lung function.  1. Moderate, persistent asthma: Continuing patient on Singulair & Dulera. No changes. Checking full pulmonary function testing without bronchodilator challenge at her before next appointment. 2. Allergic rhinitis: Well-controlled. Continuing Singulair & Flonase. No changes. 3. Ongoing tobacco use: Spelled over 3 minutes counseling patient on the need for complete tobacco  cessation. She does have significant addiction to nicotine. Does not desire to quit at this time. 4. Arthritis: Physical exam findings concerning for underlying rheumatoid arthritis. Recommended she contact her PCP for referral to rheumatology. 5. Health maintenance: Reports she did receive the Prevnar and influenza vaccines. 6. Follow-up: Patient to return to clinic in 6 months or sooner if needed.

## 2015-02-10 NOTE — Progress Notes (Signed)
Cardiology Office Note Date:  02/10/2015   ID:  Elizabeth, Mcconnell [birth year], MRN [id number]  PCP:  [name], MD  Cardiologist:  Sherren Mocha, MD    Chief Complaint  Patient presents with  . Atrial Fibrillation     History of Present Illness: Elizabeth Mcconnell is a 69 y.o. female who presents for follow-up of atrial fibrillation. The patient has a long history of paroxysmal atrial fibrillation and flutter.In 2014 she underwent atrial flutter ablation by Dr. Rayann Heman. She has undergone cardioversion in the past. She presented again this Spring with palpitations, shortness of breath, and fatigue. Found to be in and out of afib and started on cardizem for rate-control and Eliquis for anticoagulation. She has previously been intolerant to Flecainide. She called in recently with worsening symptoms and is added on to my schedule today. Her cardizem dose was recently increased without much improvement in symptoms. She continues to complain of daily palpitations and fatigue. No chest pain, edema, orthopnea, or PND.   Past Medical History  Diagnosis Date  . Tobacco abuse   . Allergic rhinitis   . Oral candidiasis   . Atrial flutter ([place])     failed cardioversion 11/2012; s/p ablation 02-02-2013 by Dr Rayann Heman  . Asthma   . Pneumothorax on left 1996    left lower lung collapse s/p resection  . Arthritis   . Osteopenia 08/2013     T score -2.3 FRAX 17%/3.0% stable from prior DEXA  . ALLERGIC RHINITIS   . ANXIETY   . Endometriosis   . Adenomatous polyp of colon 10/2013    f/u colo 10/2018  . Atrial fibrillation with RVR ([place])     on Holter 06/03/14  . Essential hypertension 05/30/2014  . Diverticulosis   . Diverticulitis     Past Surgical History  Procedure Laterality Date  . Lung surgery Left 02/1965    Lower Lobe  . Cesarean section      X 2  . Appendectomy    . Total abdominal hysterectomy w/ bilateral salpingoophorectomy  1984    Endometriosis  . Cardioversion N/A  11/27/2012    Procedure: CARDIOVERSION;  Surgeon: Thayer Headings, MD;  Location: [facility] ENDOSCOPY;  Service: Cardiovascular;  Laterality: N/A;  . Ablation  02-02-2013    CTI ablation by Dr Rayann Heman   . Abdominal hysterectomy    . Atrial flutter ablation N/A 02/02/2013    Procedure: ATRIAL FLUTTER ABLATION;  Surgeon: Coralyn Mark, MD;  Location: [facility] CATH LAB;  Service: Cardiovascular;  Laterality: N/A;    Current Outpatient Prescriptions  Medication Sig Dispense Refill  . acetaminophen (TYLENOL) 650 MG CR tablet Take 650 mg by mouth at bedtime. Second dose during day if needed    . diltiazem (CARDIZEM CD) 300 MG 24 hr capsule Take 1 capsule (300 mg total) by mouth at bedtime. 30 capsule 3  . ELIQUIS 5 MG TABS tablet TAKE 1 TABLET BY MOUTH 2 TIMES DAILY 60 tablet 5  . estradiol (VIVELLE-DOT) 0.075 MG/24HR PLACE ONE PATCH onto THE SKIN TWICE A WEEK 4 patch 0  . fluticasone (FLONASE) 50 MCG/ACT nasal spray Place 2 sprays into both nostrils twice daily. 16 g 3  . levalbuterol (XOPENEX HFA) 45 MCG/ACT inhaler Inhale 1-2 puffs into the lungs every 4 (four) hours as needed for shortness of breath.    . Linaclotide (LINZESS) 145 MCG CAPS capsule Take 1 capsule (145 mcg total) by mouth daily. 30 capsule 2  . Melatonin 3 MG TABS  Take 1 tablet by mouth daily as needed. For sleep    . mometasone-formoterol (DULERA) 200-5 MCG/ACT AERO Inhale 2 puffs into the lungs 2 (two) times daily. 13 g 11  . montelukast (SINGULAIR) 10 MG tablet Take 1 tablet (10 mg total) by mouth daily. 30 tablet 3  . saccharomyces boulardii (FLORASTOR) 250 MG capsule Take 1 capsule (250 mg total) by mouth 2 (two) times daily. 60 capsule 1  . Sennosides-Docusate Sodium (PERI-COLACE PO) Take 1 tablet by mouth daily.     No current facility-administered medications for this visit.    Allergies:   Tramadol; Amoxicillin-pot clavulanate; Flagyl; Flecainide; Omnicef; and Avelox   Social History:  The patient  reports that she has been smoking  Cigarettes.  She has been smoking about 0.00 packs per day for the past 20 years. She has never used smokeless tobacco. She reports that she drinks about 8.4 oz of alcohol per week. She reports that she does not use illicit drugs.   Family History:  The patient's  family history includes Asthma in her child and maternal grandfather; Diabetes in her mother; Heart disease in her father; Hypertension in her father; Stroke (age of onset: 4) in her father. There is no history of Colon cancer, Rectal cancer, or Stomach cancer.    ROS:  Please see the history of present illness.  Otherwise, review of systems is positive for cough, back pain, easy bruising, constipation, anxiety.  All other systems are reviewed and negative.    PHYSICAL EXAM: VS:  BP 112/62 mmHg  Pulse 102  Ht 5\' 5"  (1.651 m)  Wt 143 lb 1.9 oz (64.919 kg)  BMI 23.82 kg/m2  SpO2 99% , BMI Body mass index is 23.82 kg/(m^2). GEN: Well nourished, well developed, in no acute distress HEENT: normal Neck: no JVD, no masses. No carotid bruits Cardiac: tachycardic and irregular without murmur or gallop                Respiratory:  clear to auscultation bilaterally, normal work of breathing GI: soft, nontender, nondistended, + BS MS: no deformity or atrophy Ext: no pretibial edema, pedal pulses 2+= bilaterally Skin: warm and dry, no rash Neuro:  Strength and sensation are intact Psych: euthymic mood, full affect  EKG:  EKG is ordered today. The ekg ordered today shows atrial fibrillation with RVR, HR 124 bpm, low voltage QRS  Recent Labs: 06/06/2014: TSH 2.81 10/19/2014: ALT 15; Hemoglobin 14.1; Platelets 233 02/09/2015: BUN 12; Creat 0.87; Magnesium 1.9; Potassium 4.4; Sodium 140   Lipid Panel     Component Value Date/Time   CHOL 247* 06/13/2014 0928   TRIG 146.0 06/13/2014 0928   HDL 70.30 06/13/2014 0928   CHOLHDL 4 06/13/2014 0928   VLDL 29.2 06/13/2014 0928   LDLCALC 148* 06/13/2014 0928      Wt Readings from Last 3  Encounters:  02/10/15 144 lb 12.8 oz (65.681 kg)  02/09/15 143 lb 1.9 oz (64.919 kg)  01/12/15 142 lb 6.4 oz (64.592 kg)     Cardiac Studies Reviewed: 2D Echo 08/2012: Study Conclusions  - Left ventricle: The cavity size was normal. There was mild focal basal hypertrophy of the septum. Systolic function was normal. The estimated ejection fraction was in the range of 60% to 65%. Wall motion was normal; there were no regional wall motion abnormalities. Doppler parameters are consistent with abnormal left ventricular relaxation (grade 1 diastolic dysfunction). - Atrial septum: No defect or patent foramen ovale was identified.  Cardiac Event Monitor  05/05/2014: NSR with 2% Afib with RVR  ASSESSMENT AND PLAN: Paroxysmal atrial fibrillation: continues with PAF and highly symptomatic with episodes of AF. Reviewed options with patient. She has failed flecainide. Plan Tikosyn initiation in hospital as per protocol. She understands rationale for hospitalization. Will update her echo and check pre-admission Tikosyn labs. She has been compliant with Apixaban and has not missed any doses in the past month. If she continues to have frequent breakthrough atrial fibrillation on Tikosyn or is unable to tolerate it, will refer back to Dr Rayann Heman for consideration of AF ablation.  Current medicines are reviewed with the patient today.  The patient does not have concerns regarding medicines.  Labs/ tests ordered today include:   Orders Placed This Encounter  Procedures  . Basic Metabolic Panel (BMET)  . Magnesium  . EKG 12-Lead    Disposition:  Tikosyn admission planned  Signed, Sherren Mocha, MD  02/10/2015 9:46 PM    [address], [place], [place]  [address] Phone: [phone]; Fax: [phone]

## 2015-02-10 NOTE — Telephone Encounter (Signed)
IMAGING CXR PA/LAT 08/02/14 (per radiologist):  No active disease. Hyperinflation. Fibrotic changes LUL.  CARDIAC TTE (08/05/12):  LV with mild basal septal hypertrophy. EF 60-65%. Normal regional wall motion. Grade 1 diastolic dysfunction. LA & RA normal in size. RV normal in size & function. No aortic stenosis or regurg. No mitral stenosis & trivial regurg. No pulmonic regurg.   LABS 02/09/15 BMP:  140/4.4/103/28/12/0.87/107/10.1 MAGNESIUM:  1.9

## 2015-02-13 ENCOUNTER — Other Ambulatory Visit: Payer: Self-pay

## 2015-02-13 MED ORDER — MAGNESIUM OXIDE 400 MG PO TABS: 400.0000 mg | ORAL_TABLET | Freq: Every day | ORAL | Status: AC

## 2015-02-14 ENCOUNTER — Inpatient Hospital Stay (HOSPITAL_COMMUNITY)
Admission: AD | Admit: 2015-02-14 | Discharge: 2015-02-17 | DRG: 309 | Disposition: A | Discharge status: Home / Self Care | Payer: Medicare PPO | Source: Ambulatory Visit | Attending: Internal Medicine | Admitting: Internal Medicine

## 2015-02-14 ENCOUNTER — Ambulatory Visit (INDEPENDENT_AMBULATORY_CARE_PROVIDER_SITE_OTHER): Payer: Medicare PPO | Admitting: Pharmacist

## 2015-02-14 ENCOUNTER — Encounter (HOSPITAL_COMMUNITY): Payer: Self-pay | Admitting: Nurse Practitioner

## 2015-02-14 DIAGNOSIS — Z79899 Other long term (current) drug therapy: Secondary | ICD-10-CM | POA: Diagnosis not present

## 2015-02-14 DIAGNOSIS — Z7901 Long term (current) use of anticoagulants: Secondary | ICD-10-CM | POA: Diagnosis not present

## 2015-02-14 DIAGNOSIS — I10 Essential (primary) hypertension: Secondary | ICD-10-CM | POA: Diagnosis present

## 2015-02-14 DIAGNOSIS — E876 Hypokalemia: Secondary | ICD-10-CM | POA: Diagnosis present

## 2015-02-14 DIAGNOSIS — M1990 Unspecified osteoarthritis, unspecified site: Secondary | ICD-10-CM | POA: Diagnosis present

## 2015-02-14 DIAGNOSIS — F419 Anxiety disorder, unspecified: Secondary | ICD-10-CM | POA: Diagnosis present

## 2015-02-14 DIAGNOSIS — J45909 Unspecified asthma, uncomplicated: Secondary | ICD-10-CM | POA: Diagnosis not present

## 2015-02-14 DIAGNOSIS — I4891 Unspecified atrial fibrillation: Secondary | ICD-10-CM

## 2015-02-14 DIAGNOSIS — I480 Paroxysmal atrial fibrillation: Principal | ICD-10-CM | POA: Diagnosis present

## 2015-02-14 DIAGNOSIS — K5792 Diverticulitis of intestine, part unspecified, without perforation or abscess without bleeding: Secondary | ICD-10-CM | POA: Diagnosis not present

## 2015-02-14 DIAGNOSIS — Z720 Tobacco use: Secondary | ICD-10-CM | POA: Diagnosis not present

## 2015-02-14 DIAGNOSIS — F17210 Nicotine dependence, cigarettes, uncomplicated: Secondary | ICD-10-CM | POA: Diagnosis not present

## 2015-02-14 HISTORY — DX: Unspecified chronic bronchitis: J42

## 2015-02-14 LAB — BASIC METABOLIC PANEL
BUN: 14 mg/dL (ref 7–25)
CO2: 25 mmol/L (ref 20–31)
Calcium: 8.9 mg/dL (ref 8.6–10.4)
Chloride: 103 mmol/L (ref 98–110)
Creat: 0.9 mg/dL (ref 0.60–0.93)
Glucose, Bld: 52 mg/dL — ABNORMAL LOW (ref 65–99)
Potassium: 4.2 mmol/L (ref 3.5–5.3)
Sodium: 139 mmol/L (ref 135–146)

## 2015-02-14 LAB — MAGNESIUM: Magnesium: 2 mg/dL (ref 1.5–2.5)

## 2015-02-14 MED ORDER — FLUTICASONE PROPIONATE 50 MCG/ACT NA SUSP
1.0000 | Freq: Every day | NASAL | Status: DC
  Administered 2015-02-14 – 2015-02-16 (×3): 1 via NASAL
  Filled 2015-02-14: qty 16

## 2015-02-14 MED ORDER — SACCHAROMYCES BOULARDII 250 MG PO CAPS
250.0000 mg | ORAL_CAPSULE | Freq: Two times a day (BID) | ORAL | Status: DC
  Administered 2015-02-15 – 2015-02-17 (×4): 250 mg via ORAL
  Filled 2015-02-14 (×5): qty 1

## 2015-02-14 MED ORDER — APIXABAN 5 MG PO TABS
5.0000 mg | ORAL_TABLET | Freq: Two times a day (BID) | ORAL | Status: DC
  Administered 2015-02-14 – 2015-02-17 (×6): 5 mg via ORAL
  Filled 2015-02-14 (×6): qty 1

## 2015-02-14 MED ORDER — MOMETASONE FURO-FORMOTEROL FUM 200-5 MCG/ACT IN AERO
2.0000 | INHALATION_SPRAY | Freq: Two times a day (BID) | RESPIRATORY_TRACT | Status: DC
  Administered 2015-02-14 – 2015-02-15 (×3): 2 via RESPIRATORY_TRACT
  Filled 2015-02-14: qty 8.8

## 2015-02-14 MED ORDER — SODIUM CHLORIDE 0.9 % IJ SOLN
3.0000 mL | Freq: Two times a day (BID) | INTRAMUSCULAR | Status: DC
  Administered 2015-02-14 – 2015-02-16 (×5): 3 mL via INTRAVENOUS

## 2015-02-14 MED ORDER — MONTELUKAST SODIUM 10 MG PO TABS
10.0000 mg | ORAL_TABLET | Freq: Every day | ORAL | Status: DC
  Administered 2015-02-15 – 2015-02-17 (×3): 10 mg via ORAL
  Filled 2015-02-14 (×4): qty 1

## 2015-02-14 MED ORDER — SENNOSIDES-DOCUSATE SODIUM 8.6-50 MG PO TABS
1.0000 | ORAL_TABLET | Freq: Every day | ORAL | Status: DC
  Administered 2015-02-14 – 2015-02-16 (×3): 1 via ORAL
  Filled 2015-02-14 (×3): qty 1

## 2015-02-14 MED ORDER — MAGNESIUM OXIDE 400 (241.3 MG) MG PO TABS
400.0000 mg | ORAL_TABLET | Freq: Every day | ORAL | Status: DC
  Administered 2015-02-15 – 2015-02-17 (×3): 400 mg via ORAL
  Filled 2015-02-14 (×3): qty 1

## 2015-02-14 MED ORDER — ACETAMINOPHEN ER 650 MG PO TBCR: 650.0000 mg | EXTENDED_RELEASE_TABLET | Freq: Every evening | ORAL | Status: DC | PRN

## 2015-02-14 MED ORDER — LINACLOTIDE 145 MCG PO CAPS
145.0000 ug | ORAL_CAPSULE | Freq: Every day | ORAL | Status: DC
  Administered 2015-02-15 – 2015-02-17 (×3): 145 ug via ORAL
  Filled 2015-02-14 (×3): qty 1

## 2015-02-14 MED ORDER — DOFETILIDE 250 MCG PO CAPS
375.0000 ug | ORAL_CAPSULE | Freq: Two times a day (BID) | ORAL | Status: DC
  Administered 2015-02-14: 375 ug via ORAL
  Filled 2015-02-14: qty 1

## 2015-02-14 MED ORDER — DILTIAZEM HCL ER COATED BEADS 180 MG PO CP24
300.0000 mg | ORAL_CAPSULE | Freq: Every day | ORAL | Status: DC
  Administered 2015-02-14 – 2015-02-16 (×3): 300 mg via ORAL
  Filled 2015-02-14 (×6): qty 1

## 2015-02-14 MED ORDER — ESTRADIOL 0.075 MG/24HR TD PTTW: 1.0000 | MEDICATED_PATCH | TRANSDERMAL | Status: DC

## 2015-02-14 MED ORDER — SODIUM CHLORIDE 0.9 % IJ SOLN: 3.0000 mL | INTRAMUSCULAR | Status: DC | PRN

## 2015-02-14 MED ORDER — FLUTICASONE PROPIONATE 50 MCG/ACT NA SUSP
1.0000 | Freq: Every day | NASAL | Status: DC
Start: 2015-02-15 — End: 2015-02-14
  Filled 2015-02-14: qty 16

## 2015-02-14 MED ORDER — SODIUM CHLORIDE 0.9 % IV SOLN: 250.0000 mL | INTRAVENOUS | Status: DC | PRN

## 2015-02-14 MED ORDER — ESTRADIOL 0.075 MG/24HR TD PTWK
0.0750 mg | MEDICATED_PATCH | TRANSDERMAL | Status: DC
  Filled 2015-02-14 (×2): qty 1

## 2015-02-14 MED ORDER — ACETAMINOPHEN 325 MG PO TABS
650.0000 mg | ORAL_TABLET | Freq: Every evening | ORAL | Status: DC | PRN
Start: 2015-02-14 — End: 2015-02-17
  Administered 2015-02-14 – 2015-02-16 (×3): 650 mg via ORAL
  Filled 2015-02-14 (×3): qty 2

## 2015-02-14 MED ORDER — LEVALBUTEROL TARTRATE 45 MCG/ACT IN AERO: 1.0000 | INHALATION_SPRAY | RESPIRATORY_TRACT | Status: DC | PRN

## 2015-02-14 MED ORDER — ALBUTEROL SULFATE (2.5 MG/3ML) 0.083% IN NEBU: 2.5000 mg | INHALATION_SOLUTION | RESPIRATORY_TRACT | Status: DC | PRN

## 2015-02-14 NOTE — H&P (Signed)
ELECTROPHYSIOLOGY HISTORY AND PHYSICAL    Patient ID: Elizabeth Mcconnell MRN: [id number], DOB/AGE: Jul 25, 1945 69 y.o.  Admit date: 02/14/2015 Date of Consult: 02/14/2015  Primary Physician: [name], MD Primary Cardiologist: Burt Knack Electrophysiologist: Davione Lenker  CC: admission for Tikosyn  HPI:  Elizabeth Mcconnell is a 69 y.o. female with a past medical history significant for atrial flutter s/p ablation, hypertension, asthma, and paroxysmal atrial fibrillation.  She has failed medical therapy with Flecainide and has been admitted for Tikosyn. She reports atrial fibrillation intermittently each day seemingly worse in the morning and at night. She has not had recent chest pain, shortness of breath, LE edema, recent fevers, chills, nausea or vomiting.  Echo 08/2012 demonstrated EF 60-65%, no RWMA, grade 1 diastolic dysfunction, LA 31.    Past Medical History  Diagnosis Date  . Tobacco abuse   . Allergic rhinitis   . Oral candidiasis   . Atrial flutter ([place])     failed cardioversion 11/2012; s/p ablation 02-02-2013 by Dr Rayann Heman  . Asthma   . Pneumothorax on left 1996    left lower lung collapse s/p resection  . Arthritis   . Osteopenia 08/2013     T score -2.3 FRAX 17%/3.0% stable from prior DEXA  . ALLERGIC RHINITIS   . ANXIETY   . Endometriosis   . Adenomatous polyp of colon 10/2013    f/u colo 10/2018  . Atrial fibrillation with RVR ([place])   . Essential hypertension 05/30/2014  . Diverticulosis      Surgical History:  Past Surgical History  Procedure Laterality Date  . Lung surgery Left 02/1965    Lower Lobe  . Cesarean section      X 2  . Appendectomy    . Total abdominal hysterectomy w/ bilateral salpingoophorectomy  1984    Endometriosis  . Cardioversion N/A 11/27/2012    Procedure: CARDIOVERSION;  Surgeon: Thayer Headings, MD;  Location: [facility] ENDOSCOPY;  Service: Cardiovascular;  Laterality: N/A;  . Ablation  02-02-2013    CTI ablation by Dr Rayann Heman   .  Abdominal hysterectomy    . Atrial flutter ablation N/A 02/02/2013    Procedure: ATRIAL FLUTTER ABLATION;  Surgeon: Coralyn Mark, MD;  Location: [facility] CATH LAB;  Service: Cardiovascular;  Laterality: N/A;     Prescriptions prior to admission  Medication Sig Dispense Refill Last Dose  . acetaminophen (TYLENOL) 650 MG CR tablet Take 650 mg by mouth at bedtime. Second dose during day if needed   Taking  . diltiazem (CARDIZEM CD) 300 MG 24 hr capsule Take 1 capsule (300 mg total) by mouth at bedtime. 30 capsule 3 Taking  . ELIQUIS 5 MG TABS tablet TAKE 1 TABLET BY MOUTH 2 TIMES DAILY 60 tablet 5 Taking  . estradiol (VIVELLE-DOT) 0.075 MG/24HR PLACE ONE PATCH onto THE SKIN TWICE A WEEK 4 patch 0 Taking  . fluticasone (FLONASE) 50 MCG/ACT nasal spray Place 2 sprays into both nostrils twice daily. 16 g 3 Taking  . levalbuterol (XOPENEX HFA) 45 MCG/ACT inhaler Inhale 1-2 puffs into the lungs every 4 (four) hours as needed for shortness of breath.   Taking  . Linaclotide (LINZESS) 145 MCG CAPS capsule Take 1 capsule (145 mcg total) by mouth daily. 30 capsule 2 Taking  . magnesium oxide (MAG-OX) 400 MG tablet Take 1 tablet (400 mg total) by mouth daily. 30 tablet 6   . Melatonin 3 MG TABS Take 1 tablet by mouth daily as needed. For sleep   Taking  .  mometasone-formoterol (DULERA) 200-5 MCG/ACT AERO Inhale 2 puffs into the lungs 2 (two) times daily. 13 g 11 Taking  . montelukast (SINGULAIR) 10 MG tablet Take 1 tablet (10 mg total) by mouth daily. 30 tablet 3 Taking  . saccharomyces boulardii (FLORASTOR) 250 MG capsule Take 1 capsule (250 mg total) by mouth 2 (two) times daily. 60 capsule 1 Taking  . Sennosides-Docusate Sodium (PERI-COLACE PO) Take 1 tablet by mouth daily.   Taking    Inpatient Medications:   Allergies:  Allergies  Allergen Reactions  . Tramadol Other (See Comments)    Severe dizziness, weakness  . Amoxicillin-Pot Clavulanate Nausea And Vomiting  . Flagyl [Metronidazole] Nausea And  Vomiting  . Flecainide     dizziness  . Omnicef [Cefdinir] Nausea And Vomiting  . Avelox [Moxifloxacin] Hives, Itching and Rash    Social History   Social History  . Marital Status: Married    Spouse Name: N/A  . Number of Children: 2  . Years of Education: N/A   Occupational History  . 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     Social History Main Topics  . Smoking status: Current Some Day Smoker -- 0.00 packs/day for 20 years    Types: Cigarettes  . Smokeless tobacco: Never Used     Comment: still occas smokes  . Alcohol Use: 8.4 oz/week    14 Standard drinks or equivalent per week     Comment: daily wine  . Drug Use: No  . Sexual Activity: Yes    Birth Control/ Protection: Surgical, Post-menopausal     Comment: HYST-1st intercourse 69 yo-Fewer than 5 partners   Other Topics Concern  . Not on file   Social History Narrative   Lives with spouse;    2 children      [facility]:   She is from [facility]. 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 Previously has attended business school. Previously did office work and Programmer, applications at [employer]. She has also worked for [employer]. She does have a cat currently. She has indoor plants. No mold exposure.     Family History  Problem Relation Age of Onset  . Stroke Father 36  . Hypertension Father   . Heart disease Father   . Diabetes Mother   . Colon cancer Neg Hx   . Rectal cancer Neg Hx   . Stomach cancer Neg Hx   . Asthma Maternal Grandfather   . Asthma Child      Review of Systems: All other systems reviewed and are otherwise negative except as noted above.  Physical Exam: Filed Vitals:   02/14/15 1605 02/14/15 1607  BP:  116/81  Temp:  97.8 F (36.6 C)  TempSrc:  Oral  Resp:  16  Height: 5\' 5"  (1.651 m)   Weight: 143 lb 8.3 oz (65.1 kg)   SpO2:  97%    GEN- The patient is elderly appearing, alert and oriented x 3 today.   HEENT: normocephalic, atraumatic; sclera  clear, conjunctiva pink; hearing intact; oropharynx clear; neck supple  Lungs- Clear to ausculation bilaterally, normal work of breathing.  No wheezes, rales, rhonchi Heart- Regular rate and rhythm, no murmurs, rubs or gallops  GI- soft, non-tender, non-distended, bowel sounds present  Extremities- no clubbing, cyanosis, or edema; DP/PT/radial pulses 2+ bilaterally MS- no significant deformity or atrophy Skin- warm and dry, no rash or lesion Psych- euthymic mood, full affect Neuro- strength and sensation are intact  Labs:   Lab Results  Component Value Date   WBC 10.0 10/19/2014   HGB 14.1 10/19/2014   HCT 43.5 10/19/2014   MCV 92.0 10/19/2014   PLT 233 10/19/2014     Recent Labs Lab 02/14/15 1009  NA 139  K 4.2  CL 103  CO2 25  BUN 14  CREATININE 0.90  CALCIUM 8.9  GLUCOSE 52*    EKG: reviewed by Dr Caryl Comes in office today, SR, rate 70, QTc 462msec  Assessment/Plan: 1.  Paroxysmal atrial fibrillation Admitted for Tikosyn load.  Lung disease makes amiodarone less than ideal. She has previously been intolerant of Flecainide. If fails Tikosyn, may need to consider PVI.  With borderline creatinine clearance and QTc, will start 367mcg bid Continue Eliquis for CHADS2VASC of at least 3 (pt reports compliance last 4 weeks) Keep K >3.9, Mg >1.8 Will update echo  2.  Atrial flutter S/p CTI ablation  3.  HTN Stable No change required today  4.  Tobacco abuse The patient is not ready to quit at this time   Signed, Chanetta Marshall, NP 02/14/2015 4:09 PM  I have seen, examined the patient, and reviewed the above assessment and plan.  On exam, RRR.  Changes to above are made where necessary.    Will proceed with tikosyn load.  Co Sign: Thompson Grayer, MD 02/14/2015 4:46 PM

## 2015-02-14 NOTE — Progress Notes (Signed)
Patient ID: [name]                 DOB: 08-02-2045, 69 yo                        MRN: [id number]     HPI: Elizabeth Mcconnell is a 69 yo female patient of Dr. Burt Mcconnell who presents for Tikosyn initiation.She has a long history of paroxysmal atrial fibrillation and flutter. In 2014 she underwent atrial flutter ablation by Dr. Rayann Mcconnell. She has undergone cardioversion in the past. She presented again this Spring with palpitations, shortness of breath, and fatigue. Found to be in and out of afib and started on cardizem for rate-control and Eliquis for anticoagulation. She has previously been intolerant to Flecainide. Her symptoms were worsening last week when she saw Dr. Burt Mcconnell. At that time, the was made to initiate Tikosyn.   Patient presents today to clinic with her husband. We reviewed potential side effects of Tikosyn including QTc prolongation. She is aware of the importance of not missing doses and will call the office if she misses more than 2 doses in a row. Reviewed medication list.Patient is not currently taking any QTc prolonging drugs. She is aware to inform all of his other providers about Tikosyn and to call the office if there is ever a question about taking a new medication. She is anticoagulated with Eliquis and reports that she has not missed a dose in the last 30 days.  EKG reviewed by Dr. Caryl Mcconnell. Normal sinus rhythm with  vent rate of 70 bpm. QTc 447 msec. QTc previously ranged 420s-440s.  Labs: K 4.2, Mg 2, SCr 0.9, Wt 145 lbs, CrCl 59.53 mL/min  Past Medical History  Diagnosis Date  . Tobacco abuse   . Allergic rhinitis   . Oral candidiasis   . Atrial flutter ([place])     failed cardioversion 11/2012; s/p ablation 02-02-2013 by Dr Elizabeth Mcconnell  . Asthma   . Pneumothorax on left 1996    left lower lung collapse s/p resection  . Arthritis   . Osteopenia 08/2013     T score -2.3 FRAX 17%/3.0% stable from prior DEXA  . ALLERGIC RHINITIS   . ANXIETY   . Endometriosis   . Adenomatous  polyp of colon 10/2013    f/u colo 10/2018  . Atrial fibrillation with RVR ([place])     on Holter 06/03/14  . Essential hypertension 05/30/2014  . Diverticulosis   . Diverticulitis     Current Outpatient Prescriptions on File Prior to Visit  Medication Sig Dispense Refill  . acetaminophen (TYLENOL) 650 MG CR tablet Take 650 mg by mouth at bedtime. Second dose during day if needed    . diltiazem (CARDIZEM CD) 300 MG 24 hr capsule Take 1 capsule (300 mg total) by mouth at bedtime. 30 capsule 3  . ELIQUIS 5 MG TABS tablet TAKE 1 TABLET BY MOUTH 2 TIMES DAILY 60 tablet 5  . estradiol (VIVELLE-DOT) 0.075 MG/24HR PLACE ONE PATCH onto THE SKIN TWICE A WEEK 4 patch 0  . fluticasone (FLONASE) 50 MCG/ACT nasal spray Place 2 sprays into both nostrils twice daily. 16 g 3  . levalbuterol (XOPENEX HFA) 45 MCG/ACT inhaler Inhale 1-2 puffs into the lungs every 4 (four) hours as needed for shortness of breath.    . Linaclotide (LINZESS) 145 MCG CAPS capsule Take 1 capsule (145 mcg total) by mouth daily. 30 capsule 2  . magnesium oxide (MAG-OX) 400 MG tablet Take  1 tablet (400 mg total) by mouth daily. 30 tablet 6  . Melatonin 3 MG TABS Take 1 tablet by mouth daily as needed. For sleep    . mometasone-formoterol (DULERA) 200-5 MCG/ACT AERO Inhale 2 puffs into the lungs 2 (two) times daily. 13 g 11  . montelukast (SINGULAIR) 10 MG tablet Take 1 tablet (10 mg total) by mouth daily. 30 tablet 3  . saccharomyces boulardii (FLORASTOR) 250 MG capsule Take 1 capsule (250 mg total) by mouth 2 (two) times daily. 60 capsule 1  . Sennosides-Docusate Sodium (PERI-COLACE PO) Take 1 tablet by mouth daily.     No current facility-administered medications on file prior to visit.    Allergies  Allergen Reactions  . Tramadol Other (See Comments)    Severe dizziness, weakness  . Amoxicillin-Pot Clavulanate Nausea And Vomiting  . Flagyl [Metronidazole] Nausea And Vomiting  . Flecainide     dizziness  . Omnicef [Cefdinir]  Nausea And Vomiting  . Avelox [Moxifloxacin] Hives, Itching and Rash    Assessment/Plan:  1. Atrial fibrillation - Labs ok for Tikosyn load. QTc slightly prolonged at 447 msec. Discussed with Dr. Caryl Mcconnell and Dr. Burt Mcconnell. Pt still ok for Tikosyn load but would start at lower dose, potentially 354mcg BID. Based on CrCl of 59.5, pt is borderline for 252mcg and 583mcg dosing. Patient aware to report to admitting.    [name] [name], PharmD [place] [id number], [place], [place] [address] Phone: [phone]; Fax: [phone] 02/14/2015 10:36 AM

## 2015-02-15 ENCOUNTER — Inpatient Hospital Stay (HOSPITAL_COMMUNITY): Payer: Medicare PPO

## 2015-02-15 DIAGNOSIS — I4891 Unspecified atrial fibrillation: Secondary | ICD-10-CM

## 2015-02-15 LAB — MAGNESIUM: Magnesium: 2.1 mg/dL (ref 1.7–2.4)

## 2015-02-15 LAB — BASIC METABOLIC PANEL
Anion gap: 8 (ref 5–15)
BUN: 11 mg/dL (ref 6–20)
CO2: 23 mmol/L (ref 22–32)
Calcium: 8.7 mg/dL — ABNORMAL LOW (ref 8.9–10.3)
Chloride: 108 mmol/L (ref 101–111)
Creatinine, Ser: 0.79 mg/dL (ref 0.44–1.00)
GFR calc Af Amer: >60 mL/min (ref >60)
GFR calc non Af Amer: >60 mL/min (ref >60)
Glucose, Bld: 81 mg/dL (ref 65–99)
Potassium: 3.6 mmol/L (ref 3.5–5.1)
Sodium: 139 mmol/L (ref 135–145)

## 2015-02-15 MED ORDER — POTASSIUM CHLORIDE CRYS ER 20 MEQ PO TBCR
40.0000 meq | EXTENDED_RELEASE_TABLET | Freq: Once | ORAL | Status: AC
  Administered 2015-02-15: 40 meq via ORAL
  Filled 2015-02-15: qty 2

## 2015-02-15 MED ORDER — DOFETILIDE 250 MCG PO CAPS
250.0000 ug | ORAL_CAPSULE | Freq: Two times a day (BID) | ORAL | Status: DC
  Administered 2015-02-15 – 2015-02-17 (×4): 250 ug via ORAL
  Filled 2015-02-15 (×4): qty 1

## 2015-02-15 MED ORDER — DOFETILIDE 250 MCG PO CAPS: 250.0000 ug | ORAL_CAPSULE | Freq: Two times a day (BID) | ORAL | Status: DC

## 2015-02-15 NOTE — Plan of Care (Signed)
Problem: Activity: Goal: Ability to tolerate increased activity will improve Outcome: Progressing Pt is currently on NSR

## 2015-02-15 NOTE — Progress Notes (Signed)
    SUBJECTIVE: The patient is doing well today.  At this time, she denies chest pain, shortness of breath, or any new concerns.  Admitted 02/14/15 for Tikosyn load  CURRENT MEDICATIONS: . apixaban  5 mg Oral BID  . diltiazem  300 mg Oral QHS  . dofetilide  250 mcg Oral BID  . [START ON 02/16/2015] estradiol  0.075 mg Transdermal Weekly  . fluticasone  1 spray Each Nare Daily  . Linaclotide  145 mcg Oral Daily  . magnesium oxide  400 mg Oral Daily  . mometasone-formoterol  2 puff Inhalation BID  . montelukast  10 mg Oral Daily  . saccharomyces boulardii  250 mg Oral BID  . senna-docusate  1 tablet Oral QHS  . sodium chloride  3 mL Intravenous Q12H      OBJECTIVE: Physical Exam: Filed Vitals:   02/14/15 1607 02/14/15 2025 02/14/15 2204 02/15/15 0539  BP: 116/81 124/71 133/71 113/57  Pulse:  73  64  Temp: 97.8 F (36.6 C) 97.8 F (36.6 C)  97.5 F (36.4 C)  TempSrc: Oral Oral  Oral  Resp: 16 18  18   Height:      Weight:    142 lb 1.6 oz (64.456 kg)  SpO2: 97% 98%  97%   No intake or output data in the 24 hours ending 02/15/15 0803  Telemetry reveals sinus rhythm  GEN- The patient is elderly appearing, alert and oriented x 3 today.  HEENT: normocephalic, atraumatic; sclera clear, conjunctiva pink; hearing intact; oropharynx clear; neck supple  Lungs- Clear to ausculation bilaterally, normal work of breathing. No wheezes, rales, rhonchi Heart- Regular rate and rhythm, no murmurs, rubs or gallops  GI- soft, non-tender, non-distended, bowel sounds present  Extremities- no clubbing, cyanosis, or edema; DP/PT/radial pulses 2+ bilaterally MS- no significant deformity or atrophy Skin- warm and dry, no rash or lesion Psych- euthymic mood, full affect Neuro- strength and sensation are intact  LABS: Basic Metabolic Panel:  Recent Labs  02/14/15 1009 02/15/15 0223  NA 139 139  K 4.2 3.6  CL 103 108  CO2 25 23  GLUCOSE 52* 81  BUN 14 11  CREATININE 0.90 0.79    CALCIUM 8.9 8.7*  MG 2.0 2.1    ASSESSMENT AND PLAN:  Active Problems:   Paroxysmal atrial fibrillation ([facility])  1. Paroxysmal atrial fibrillation Admitted for Tikosyn load. Lung disease makes amiodarone less than ideal. She has previously been intolerant of Flecainide. If fails Tikosyn, may need to consider PVI.  Continue Tikosyn 315mcg bid Continue Eliquis for CHADS2VASC of at least 3   Mg stable K 3.6 - will replete QTc prolonged this morning - Dr Rayann Heman reviewed - will hold Tikosyn this morning and resume at 221mcg tonight.  Echo ordered  2. HTN Stable No change required today  3. Tobacco abuse The patient is not ready to quit at this time  4.  Hypokalemia Will replete  Chanetta Marshall, NP 02/15/2015 8:04 AM  I have seen, examined the patient, and reviewed the above assessment and plan.  On exam, RRR.  Changes to above are made where necessary.  QT is prolonged on 375 mcg BID.  Will hold tikosyn this am and restart tonight at 250 mcg BID if QT<500 msec at that time.  Co Sign: Thompson Grayer, MD 02/15/2015 9:31 AM

## 2015-02-15 NOTE — Care Management Note (Addendum)
Case Management Note  Patient Details  Name: Neya Nyholm MRN: [id number] Date of Birth: 09/27/1945  Subjective/Objective: Pt admitted for Tikosyn Load.                     Action/Plan: Benefits Check in process and will make pt aware of cost once completed. CM will assist with the Rx for 7 day supply to be filled via the main Pharmacy. No further needs from CM at this time.    Expected Discharge Date:                  Expected Discharge Plan:  Home/Self Care  In-House Referral:  NA  Discharge planning Services  CM Consult, Medication Assistance  Post Acute Care Choice:  NA Choice offered to:  NA  DME Arranged:  N/A DME Agency:  NA  HH Arranged:  NA HH Agency:  NA  Status of Service:  Completed, signed off  Medicare Important Message Given:    Date Medicare IM Given:    Medicare IM give by:    Date Additional Medicare IM Given:    Additional Medicare Important Message give by:     If discussed at [place] of Stay Meetings, dates discussed:    Additional Comments: [id number] 02-16-15 Jacqlyn Krauss, RN,BSN [phone] Pt copay will be $64-prior auth not required   1025 02-15-15 Jacqlyn Krauss, RN,BSN [phone] Pt uses [facility] did call and medication can be ordered. No further needs at this time.   Bethena Roys, RN 02/15/2015, 8:52 AM

## 2015-02-15 NOTE — Progress Notes (Signed)
UR Completed Gabryelle Whitmoyer Graves-Bigelow, RN,BSN [phone]  

## 2015-02-15 NOTE — Progress Notes (Signed)
  Echocardiogram 2D Echocardiogram has been performed.  Syrah Daughtrey 02/15/2015, 11:27 AM

## 2015-02-16 LAB — BASIC METABOLIC PANEL WITH GFR
Anion gap: 7 (ref 5–15)
BUN: 9 mg/dL (ref 6–20)
CO2: 24 mmol/L (ref 22–32)
Calcium: 8.8 mg/dL — ABNORMAL LOW (ref 8.9–10.3)
Chloride: 110 mmol/L (ref 101–111)
Creatinine, Ser: 0.75 mg/dL (ref 0.44–1.00)
GFR calc Af Amer: >60 mL/min (ref >60)
GFR calc non Af Amer: >60 mL/min (ref >60)
Glucose, Bld: 91 mg/dL (ref 65–99)
Potassium: 4.4 mmol/L (ref 3.5–5.1)
Sodium: 141 mmol/L (ref 135–145)

## 2015-02-16 LAB — MAGNESIUM: Magnesium: 2.2 mg/dL (ref 1.7–2.4)

## 2015-02-16 MED ORDER — MOMETASONE FURO-FORMOTEROL FUM 200-5 MCG/ACT IN AERO
2.0000 | INHALATION_SPRAY | Freq: Two times a day (BID) | RESPIRATORY_TRACT | Status: DC
  Administered 2015-02-16 – 2015-02-17 (×3): 2 via RESPIRATORY_TRACT
  Filled 2015-02-16: qty 8.8

## 2015-02-16 NOTE — Progress Notes (Signed)
QTc, on Rhythm strip is 0.47, QRS is 0.11. Tikosyn second dose 250 mcg given 02/15/15  @ 2208.  Ferdinand Lango, RN

## 2015-02-16 NOTE — Discharge Summary (Signed)
ELECTROPHYSIOLOGY PROCEDURE DISCHARGE SUMMARY    Patient ID: Elizabeth Mcconnell,  MRN: [id number], DOB/AGE: 10-May-1945 69 y.o.  Admit date: 02/14/2015 Discharge date: 02/17/2015  Primary Care Physician: [name], MD Primary Cardiologist: Burt Knack Electrophysiologist: Euel Castile  Primary Discharge Diagnosis:  1.  Paroyxsmal atrial fibrillation status post Tikosyn loading this admission  Secondary Discharge Diagnosis:  1.  Tobacco abuse 2.  Atrial flutter s/p ablation 3.  Hypertension 4.  Diverticulitis 5.  Asthma  Allergies  Allergen Reactions  . Tramadol Other (See Comments)    Severe dizziness, weakness  . Amoxicillin-Pot Clavulanate Nausea And Vomiting  . Flagyl [Metronidazole] Nausea And Vomiting  . Flecainide     dizziness  . Omnicef [Cefdinir] Nausea And Vomiting  . Avelox [Moxifloxacin] Hives, Itching and Rash     Procedures This Admission:  1.  Tikosyn loading 2.  Echocardiogram 02/15/15 demonstrated EF 60-65%, no RWMA, LA 30    Brief HPI: Elizabeth Mcconnell is a 69 y.o. female with a past medical history as noted above. She has undergone prior flutter ablation and developed paroxysmal atrial fibrillation. She was intolerant of Flecanide.  Risks, benefits, and alternatives to Tikosyn were reviewed with the patient who wished to proceed.    Hospital Course:  The patient was admitted and Tikosyn was initiated.  Renal function and electrolytes were followed during the hospitalization.  Their QTc remained stable on decreased dose of Tikosyn.  They were monitored until discharge on telemetry which demonstrated sinus rhythm with occasional PVC's.  On the day of discharge, they were examined by Dr Elizabeth Mcconnell who considered them stable for discharge to home.  Follow-up has been arranged with [facility] pharmacists in 1 week, AF clinic in 4 weeks and with Dr Elizabeth Mcconnell in 12 weeks.   Physical Exam: Filed Vitals:   02/16/15 0924 02/16/15 1640 02/16/15 2012 02/17/15 0435  BP:   119/55 134/53 117/59  Pulse:  62 65   Temp:  98.2 F (36.8 C) 97.8 F (36.6 C) 97.9 F (36.6 C)  TempSrc:  Oral Oral Oral  Resp:  18 16 17   Height:      Weight:    142 lb 6.4 oz (64.592 kg)  SpO2: 97% 97% 98% 98%    GEN- The patient is well appearing, alert and oriented x 3 today.   HEENT: normocephalic, atraumatic; sclera clear, conjunctiva pink; hearing intact; oropharynx clear; neck supple  Lungs- Clear to ausculation bilaterally, normal work of breathing.  No wheezes, rales, rhonchi Heart- Regular rate and rhythm  GI- soft, non-tender, non-distended, bowel sounds present  Extremities- no clubbing, cyanosis, or edema; DP/PT/radial pulses 2+ bilaterally MS- no significant deformity or atrophy Skin- warm and dry, no rash or lesion Psych- euthymic mood, full affect Neuro- strength and sensation are intact   Labs:   Lab Results  Component Value Date   WBC 10.0 10/19/2014   HGB 14.1 10/19/2014   HCT 43.5 10/19/2014   MCV 92.0 10/19/2014   PLT 233 10/19/2014     Recent Labs Lab 02/17/15 0414  NA 140  K 4.1  CL 107  CO2 26  BUN 8  CREATININE 0.78  CALCIUM 8.9  GLUCOSE 90     Discharge Medications:    Medication List    TAKE these medications        acetaminophen 650 MG CR tablet  Commonly known as:  TYLENOL  Take 1,300 mg by mouth at bedtime. Second dose during day if needed     diltiazem 300  MG 24 hr capsule  Commonly known as:  CARTIA XT  Take 1 capsule (300 mg total) by mouth at bedtime.     dofetilide 250 MCG capsule  Commonly known as:  TIKOSYN  Take 1 capsule (250 mcg total) by mouth 2 (two) times daily.     ELIQUIS 5 MG Tabs tablet  Generic drug:  apixaban  TAKE 1 TABLET BY MOUTH 2 TIMES DAILY     estradiol 0.075 MG/24HR  Commonly known as:  VIVELLE-DOT  PLACE ONE PATCH onto THE SKIN TWICE A WEEK     fluticasone 50 MCG/ACT nasal spray  Commonly known as:  FLONASE  Place 2 sprays into both nostrils twice daily.     levalbuterol 45  MCG/ACT inhaler  Commonly known as:  XOPENEX HFA  Inhale 1-2 puffs into the lungs every 4 (four) hours as needed for shortness of breath.     Linaclotide 145 MCG Caps capsule  Commonly known as:  LINZESS  Take 1 capsule (145 mcg total) by mouth daily.     magnesium oxide 400 MG tablet  Commonly known as:  MAG-OX  Take 1 tablet (400 mg total) by mouth daily.     Melatonin 3 MG Tabs  Take 1 tablet by mouth daily as needed. For sleep     mometasone-formoterol 200-5 MCG/ACT Aero  Commonly known as:  DULERA  Inhale 2 puffs into the lungs 2 (two) times daily.     montelukast 10 MG tablet  Commonly known as:  SINGULAIR  Take 1 tablet (10 mg total) by mouth daily.     PERI-COLACE PO  Take 1 tablet by mouth daily.     saccharomyces boulardii 250 MG capsule  Commonly known as:  FLORASTOR  Take 1 capsule (250 mg total) by mouth 2 (two) times daily.        Disposition:  Discharge Instructions    Diet - low sodium heart healthy    Complete by:  As directed      Increase activity slowly    Complete by:  As directed           Follow-up Information    Follow up with [facility] On 02/22/2015.   Specialty:  Cardiology   Why:  at 9:30 for EKG and labs   Contact information:   [address], [place] [phone]      Follow up with [facility] On 03/16/2015.   Specialty:  Cardiology   Why:  at 9:30AM   Contact information:   [address] [id number] [place] [phone]      Follow up with Thompson Grayer, MD On 05/18/2015.   Specialty:  Cardiology   Why:  at 2:15PM   Contact information:   [place] [address] [phone]       Duration of Discharge Encounter: Greater than 30 minutes including physician time.  Signed, Chanetta Marshall, NP 02/17/2015 6:35 AM     I have seen, examined the patient, and reviewed the  above assessment and plan. On exam, RRR.  QTc 480 msec.  Changes to above are made where necessary.    Co Sign: Thompson Grayer, MD 02/17/2015 8:39 AM

## 2015-02-16 NOTE — Progress Notes (Signed)
QTc, on Rhythm strip is 0.46, QRS is 0.08. Tikosyn 250 mcg to be given tonight.  Ferdinand Lango, RN

## 2015-02-16 NOTE — Progress Notes (Signed)
    SUBJECTIVE: The patient is doing well today.  At this time, she denies chest pain, shortness of breath, or any new concerns.  Admitted 02/14/15 for Tikosyn load. QTc prolonged on 373mcg, dose decreased to 221mcg last night.   Echo 02/15/15 demonstrated EF 60-65%, no RWMA, LA 30.  CURRENT MEDICATIONS: . apixaban  5 mg Oral BID  . diltiazem  300 mg Oral QHS  . dofetilide  250 mcg Oral BID  . estradiol  0.075 mg Transdermal Weekly  . fluticasone  1 spray Each Nare Daily  . Linaclotide  145 mcg Oral Daily  . magnesium oxide  400 mg Oral Daily  . mometasone-formoterol  2 puff Inhalation BID  . montelukast  10 mg Oral Daily  . saccharomyces boulardii  250 mg Oral BID  . senna-docusate  1 tablet Oral QHS  . sodium chloride  3 mL Intravenous Q12H      OBJECTIVE: Physical Exam: Filed Vitals:   02/15/15 2000 02/15/15 2050 02/15/15 2212 02/16/15 0557  BP:  125/61 121/62 115/53  Pulse:  65  62  Temp:  98.1 F (36.7 C)  98.5 F (36.9 C)  TempSrc:  Oral  Oral  Resp:  18  18  Height:      Weight:    142 lb 3.2 oz (64.501 kg)  SpO2: 98% 97%  98%    Intake/Output Summary (Last 24 hours) at 02/16/15 0749 Last data filed at 02/16/15 0601  Gross per 24 hour  Intake    240 ml  Output    400 ml  Net   -160 ml    Telemetry reveals sinus rhythm  GEN- The patient is elderly appearing, alert and oriented x 3 today.  HEENT: normocephalic, atraumatic; sclera clear, conjunctiva pink; hearing intact; oropharynx clear; neck supple  Lungs- Clear to ausculation bilaterally, normal work of breathing. No wheezes, rales, rhonchi Heart- Regular rate and rhythm, no murmurs, rubs or gallops  GI- soft, non-tender, non-distended, bowel sounds present  Extremities- no clubbing, cyanosis, or edema; DP/PT/radial pulses 2+ bilaterally MS- no significant deformity or atrophy Skin- warm and dry, no rash or lesion Psych- euthymic mood, full affect Neuro- strength and sensation are  intact  LABS: Basic Metabolic Panel:  Recent Labs  02/15/15 0223 02/16/15 0402  NA 139 141  K 3.6 4.4  CL 108 110  CO2 23 24  GLUCOSE 81 91  BUN 11 9  CREATININE 0.79 0.75  CALCIUM 8.7* 8.8*  MG 2.1 2.2    ASSESSMENT AND PLAN:  Active Problems:   Paroxysmal atrial fibrillation (HCC)  1. Paroxysmal atrial fibrillation Admitted for Tikosyn load. Lung disease makes amiodarone less than ideal. She has previously been intolerant of Flecainide. If fails Tikosyn, may need to consider PVI. QT prolongation on 314mcg of Tikosyn Continue Tikosyn 278mcg bid today Continue Eliquis for CHADS2VASC of at least 3   Mg, K stable QT stable per rhythm strip, EKG pending this morning  2. HTN Stable No change required today  3. Tobacco abuse The patient is not ready to quit at this time  If QT stable today on 244mcg, anticipate discharge tomorrow   Chanetta Marshall, NP 02/16/2015 7:49 AM   I have seen, examined the patient, and reviewed the above assessment and plan. On exam, RRR. QT is stable on tikosyn 250 mcg BID. Changes to above are made where necessary.   Hope to discharge to home tomorrow.  Co Sign: Thompson Grayer, MD 02/16/2015 12:35 PM

## 2015-02-17 ENCOUNTER — Telehealth: Payer: Self-pay | Admitting: *Deleted

## 2015-02-17 LAB — BASIC METABOLIC PANEL
Anion gap: 7 (ref 5–15)
BUN: 8 mg/dL (ref 6–20)
CO2: 26 mmol/L (ref 22–32)
Calcium: 8.9 mg/dL (ref 8.9–10.3)
Chloride: 107 mmol/L (ref 101–111)
Creatinine, Ser: 0.78 mg/dL (ref 0.44–1.00)
GFR calc Af Amer: >60 mL/min (ref >60)
GFR calc non Af Amer: >60 mL/min (ref >60)
Glucose, Bld: 90 mg/dL (ref 65–99)
Potassium: 4.1 mmol/L (ref 3.5–5.1)
Sodium: 140 mmol/L (ref 135–145)

## 2015-02-17 LAB — MAGNESIUM: Magnesium: 2 mg/dL (ref 1.7–2.4)

## 2015-02-17 MED ORDER — DOFETILIDE 250 MCG PO CAPS: 250.0000 ug | ORAL_CAPSULE | Freq: Two times a day (BID) | ORAL | Status: DC

## 2015-02-17 NOTE — Telephone Encounter (Signed)
Pt was on TCM list admitted for A-fib. Pt had TIKOSYN d/c 12/16, will be f/u with cardiology on 02/22/15...[name]

## 2015-02-17 NOTE — Progress Notes (Signed)
UR Completed Joice Nazario Graves-Bigelow, RN,BSN [phone]  

## 2015-02-17 NOTE — Discharge Instructions (Signed)

## 2015-02-22 ENCOUNTER — Ambulatory Visit (INDEPENDENT_AMBULATORY_CARE_PROVIDER_SITE_OTHER): Payer: Medicare PPO | Admitting: Nurse Practitioner

## 2015-02-22 ENCOUNTER — Encounter: Payer: Medicare PPO | Admitting: Pharmacist

## 2015-02-22 ENCOUNTER — Encounter: Payer: Self-pay | Admitting: Nurse Practitioner

## 2015-02-22 VITALS — BP 122/76 | HR 116 | Ht 65.0 in | Wt 144.2 lb

## 2015-02-22 DIAGNOSIS — Z720 Tobacco use: Secondary | ICD-10-CM

## 2015-02-22 DIAGNOSIS — I480 Paroxysmal atrial fibrillation: Secondary | ICD-10-CM | POA: Diagnosis not present

## 2015-02-22 DIAGNOSIS — I10 Essential (primary) hypertension: Secondary | ICD-10-CM | POA: Diagnosis not present

## 2015-02-22 DIAGNOSIS — I4891 Unspecified atrial fibrillation: Secondary | ICD-10-CM | POA: Diagnosis not present

## 2015-02-22 NOTE — Patient Instructions (Addendum)
Medication Instructions:   Your physician recommends that you continue on your current medications as directed. Please refer to the Current Medication list given to you today.  If you need a refill on your cardiac medications before your next appointment, please call your pharmacy.  Labwork:  BMET AND MAG    Testing/Procedures:  NURSES VISIT   W/ EKG  ON Tuesday     Follow-Up:  ALREADY SCHEDULED    Any Other Special Instructions Will Be Listed Below (If Applicable).

## 2015-02-22 NOTE — Progress Notes (Signed)
Electrophysiology Office Note Date: 02/22/2015  ID:  Elizabeth Mcconnell, Elizabeth Mcconnell 1945-08-29, MRN [id number]  PCP: [name], MD Primary Cardiologist: Burt Knack Electrophysiologist: Rayann Heman  CC: Tikosyn follow up  [name] is a 69 y.o. female seen today for Dr Rayann Heman.  She presents today for post hospital electrophysiology followup.  She was admitted last week for Tikosyn load.  Since discharge, the patient reports doing very well.  She reports that she has had improved burden atrial fibrillation and increased energy.  She denies chest pain, dyspnea, PND, orthopnea, nausea, vomiting, dizziness, syncope, edema, weight gain, or early satiety.  Past Medical History  Diagnosis Date  . Tobacco abuse   . Allergic rhinitis   . Oral candidiasis   . Atrial flutter ([place])     failed cardioversion 11/2012; s/p ablation 02-02-2013 by Dr Rayann Heman  . Asthma   . Pneumothorax on left 1996    left lower lung collapse s/p resection  . Osteopenia 08/2013     T score -2.3 FRAX 17%/3.0% stable from prior DEXA  . ALLERGIC RHINITIS   . Endometriosis   . Adenomatous polyp of colon 10/2013    f/u colo 10/2018  . Atrial fibrillation with RVR ([place])   . Essential hypertension 05/30/2014  . Diverticulosis   . Chronic bronchitis ([place])     "I'll have it most years" (02/14/2015)  . Arthritis     "neck, back, hands" (02/14/2015)  . ANXIETY     "situational" (02/14/2015)   Past Surgical History  Procedure Laterality Date  . Lung removal, partial Left 02/1965    LLL  . Cesarean section  1976; 1978  . Total abdominal hysterectomy w/ bilateral salpingoophorectomy  1984    Endometriosis  . Cardioversion N/A 11/27/2012    Procedure: CARDIOVERSION;  Surgeon: Thayer Headings, MD;  Location: [facility] ENDOSCOPY;  Service: Cardiovascular;  Laterality: N/A;  . Atrial flutter ablation N/A 02/02/2013    Procedure: ATRIAL FLUTTER ABLATION;  Surgeon: Coralyn Mark, MD;  Location: [facility] CATH LAB;  Service:  Cardiovascular;  Laterality: N/A;  . Appendectomy  1978    Current Outpatient Prescriptions  Medication Sig Dispense Refill  . acetaminophen (TYLENOL) 650 MG CR tablet Take 1,300 mg by mouth at bedtime. Second dose during day if needed    . diltiazem (CARDIZEM CD) 300 MG 24 hr capsule Take 1 capsule (300 mg total) by mouth at bedtime. 30 capsule 3  . dofetilide (TIKOSYN) 250 MCG capsule Take 1 capsule (250 mcg total) by mouth 2 (two) times daily. 14 capsule 0  . ELIQUIS 5 MG TABS tablet TAKE 1 TABLET BY MOUTH 2 TIMES DAILY 60 tablet 5  . estradiol (VIVELLE-DOT) 0.075 MG/24HR PLACE ONE PATCH onto THE SKIN TWICE A WEEK 4 patch 0  . fluticasone (FLONASE) 50 MCG/ACT nasal spray Place 2 sprays into both nostrils twice daily. 16 g 3  . levalbuterol (XOPENEX HFA) 45 MCG/ACT inhaler Inhale 1-2 puffs into the lungs every 4 (four) hours as needed for shortness of breath.    . Linaclotide (LINZESS) 145 MCG CAPS capsule Take 1 capsule (145 mcg total) by mouth daily. (Patient taking differently: Take 145 mcg by mouth daily before breakfast. ) 30 capsule 2  . magnesium oxide (MAG-OX) 400 MG tablet Take 1 tablet (400 mg total) by mouth daily. 30 tablet 6  . Melatonin 3 MG TABS Take 1 tablet by mouth daily as needed. For sleep    . mometasone-formoterol (DULERA) 200-5 MCG/ACT AERO Inhale 2 puffs  into the lungs 2 (two) times daily. 13 g 11  . montelukast (SINGULAIR) 10 MG tablet Take 1 tablet (10 mg total) by mouth daily. 30 tablet 3  . saccharomyces boulardii (FLORASTOR) 250 MG capsule Take 1 capsule (250 mg total) by mouth 2 (two) times daily. 60 capsule 1  . Sennosides-Docusate Sodium (PERI-COLACE PO) Take 1 tablet by mouth daily.     No current facility-administered medications for this visit.    Allergies:   Tramadol; Amoxicillin-pot clavulanate; Flagyl; Flecainide; Omnicef; and Avelox   Social History: Social History   Social History  . Marital Status: Married    Spouse Name: N/A  . Number of  Children: 2  . Years of Education: N/A   Occupational History  . 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     Social History Main Topics  . Smoking status: Current Every Day Smoker -- 0.25 packs/day for 43 years    Types: Cigarettes  . Smokeless tobacco: Never Used  . Alcohol Use: 18.0 oz/week    14 Standard drinks or equivalent, 16 Glasses of wine per week     Comment: daily wine  . Drug Use: No  . Sexual Activity: Yes    Birth Control/ Protection: Surgical, Post-menopausal     Comment: HYST-1st intercourse 69 yo-Fewer than 5 partners   Other Topics Concern  . Not on file   Social History Narrative   Lives with spouse;    2 children      [facility]:   She is from [facility]. 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 Previously has attended business school. Previously did office work and Programmer, applications at [employer]. She has also worked for [employer]. She does have a cat currently. She has indoor plants. No mold exposure.    Family History: Family History  Problem Relation Age of Onset  . Stroke Father 50  . Hypertension Father   . Heart disease Father   . Diabetes Mother   . Colon cancer Neg Hx   . Rectal cancer Neg Hx   . Stomach cancer Neg Hx   . Asthma Maternal Grandfather   . Asthma Child     Review of Systems: All other systems reviewed and are otherwise negative except as noted above.   Physical Exam: VS:  BP 122/76 mmHg  Pulse 116  Ht 5\' 5"  (1.651 m)  Wt 144 lb 3.2 oz (65.409 kg)  BMI 24.00 kg/m2 , BMI Body mass index is 24 kg/(m^2). Wt Readings from Last 3 Encounters:  02/22/15 144 lb 3.2 oz (65.409 kg)  02/17/15 142 lb 6.4 oz (64.592 kg)  02/10/15 144 lb 12.8 oz (65.681 kg)    GEN- The patient is well appearing, alert and oriented x 3 today.   HEENT: normocephalic, atraumatic; sclera clear, conjunctiva pink; hearing intact; oropharynx clear; neck supple Lungs- Clear to ausculation bilaterally, normal work of  breathing.  No wheezes, rales, rhonchi Heart- Irregular rate and rhythm GI- soft, non-tender, non-distended, bowel sounds present Extremities- no clubbing, cyanosis, or edema; DP/PT/radial pulses 2+ bilaterally MS- no significant deformity or atrophy Skin- warm and dry, no rash or lesion  Psych- euthymic mood, full affect Neuro- strength and sensation are intact   EKG:  EKG is ordered today. The ekg ordered today shows atrial fibrillation, QTc 469msec by my calculation  Recent Labs: 06/06/2014: TSH 2.81 10/19/2014: ALT 15; Hemoglobin 14.1; Platelets 233 02/17/2015: BUN 8; Creatinine, Ser 0.78; Magnesium 2.0; Potassium 4.1; Sodium  140    Other studies Reviewed: Additional studies/ records that were reviewed today include: hospital records  Assessment and Plan: 1.  Paroxysmal atrial fibrillation Predominately maintaining SR on Tikosyn, but in AF today BMET, Mg today QTc 440msec today by my calculation - will have patient come back next week for EKG in SR Continue Tikosyn 254mcg bid Continue Eliquis for CHADS2VASC of 3 If she fails tikosyn, may need to consider PVI. She has previously been intolerant of Flecainide.   2.  HTN Stable No change required today  3.  Tobacco abuse The patient is ready to try to quit again Discussed with patient for >5 minutes Will try Chantix Handwritten Rx given to patient today per her request    Current medicines are reviewed at length with the patient today.   The patient does not have concerns regarding her medicines.  The following changes were made today:  none  Labs/ tests ordered today include: BMET, Mg   Disposition:   Follow up with AF clinic and Dr Rayann Heman as scheduled   Signed, Chanetta Marshall, NP 02/22/2015 3:13 PM   [employer] [address] [place] [address] [phone] (office) [phone] (fax)

## 2015-02-23 ENCOUNTER — Telehealth: Payer: Self-pay | Admitting: *Deleted

## 2015-02-23 ENCOUNTER — Other Ambulatory Visit: Payer: Self-pay | Admitting: *Deleted

## 2015-02-23 DIAGNOSIS — I4891 Unspecified atrial fibrillation: Secondary | ICD-10-CM

## 2015-02-23 LAB — BASIC METABOLIC PANEL
BUN: 13 mg/dL (ref 7–25)
CO2: 23 mmol/L (ref 20–31)
Calcium: 9.5 mg/dL (ref 8.6–10.4)
Chloride: 102 mmol/L (ref 98–110)
Creat: 0.8 mg/dL (ref 0.60–0.93)
Glucose, Bld: 92 mg/dL (ref 65–99)
Potassium: 3.8 mmol/L (ref 3.5–5.3)
Sodium: 137 mmol/L (ref 135–146)

## 2015-02-23 LAB — MAGNESIUM: Magnesium: 2.1 mg/dL (ref 1.5–2.5)

## 2015-02-23 NOTE — Telephone Encounter (Signed)
-----   Message from Patsey Berthold, NP sent at 02/23/2015  8:37 AM EST ----- Please notify patient of stable labs. K will need to be rechecked at visit with AF clinic next month.  Mg is good.  I would advise staying on Mg supplement for now

## 2015-02-28 ENCOUNTER — Other Ambulatory Visit: Payer: Self-pay | Admitting: Physician Assistant

## 2015-02-28 ENCOUNTER — Ambulatory Visit (INDEPENDENT_AMBULATORY_CARE_PROVIDER_SITE_OTHER): Payer: Medicare PPO | Admitting: *Deleted

## 2015-02-28 VITALS — BP 132/78 | HR 62 | Wt 145.0 lb

## 2015-02-28 DIAGNOSIS — I480 Paroxysmal atrial fibrillation: Secondary | ICD-10-CM | POA: Diagnosis not present

## 2015-02-28 NOTE — Progress Notes (Signed)
1.) Reason for visit: EKG- on Tikosyn  2.) Name of MD requesting visit: [place]/ Chanetta Marshall, NP  3.) H&P: The patient was admitted on 02/14/15 for Tikosyn loading (250 mcg BID). She was seen for post hospital follow up on 02/22/15 with Chanetta Marshall, NP and was in a-fib. She was brought back in to the office today for a follow up EKG and assessment of her QTc interval.  4.) ROS related to problem: The patient reports she had one episode of a-fib on 02/26/15 that started about 5:45 am. She states this was the "worst" episode of a-fib that she has had- this lasted for "a little while." She reports that she had a good day yesterday, but that she has felt a little jittery this morning.  5.) Assessment and plan per MD: Reviewed EKG with Dr. Curt Bears (DOD)- NSR w/ HR- 62 bpm. QTc- 456 ms today. No changes per Dr. Curt Bears based on reading to day. The patient is scheduled to follow up with Roderic Palau, NP on 03/16/15. She will have a repeat BMP that day as her K+ on 12/21 was 3.8.

## 2015-03-02 DIAGNOSIS — H5213 Myopia, bilateral: Secondary | ICD-10-CM | POA: Diagnosis not present

## 2015-03-02 DIAGNOSIS — H52221 Regular astigmatism, right eye: Secondary | ICD-10-CM | POA: Diagnosis not present

## 2015-03-02 DIAGNOSIS — H524 Presbyopia: Secondary | ICD-10-CM | POA: Diagnosis not present

## 2015-03-03 ENCOUNTER — Telehealth: Payer: Self-pay | Admitting: Pulmonary Disease

## 2015-03-03 MED ORDER — DOXYCYCLINE HYCLATE 100 MG PO TABS: 100.0000 mg | ORAL_TABLET | Freq: Two times a day (BID) | ORAL | Status: DC

## 2015-03-03 NOTE — Telephone Encounter (Signed)
Please phone in a Rx for Doxycycline 100mg  1 tab po bid x7 days. No refills. Thanks.

## 2015-03-03 NOTE — Telephone Encounter (Signed)
Called spoke with pt. Aware of recs. RX sent in. Nothing further needed 

## 2015-03-03 NOTE — Telephone Encounter (Signed)
Called spoke with pt. She c/o prod cough (yellow w/ traces of brown phlem), PND, and sore throat. She wants to catch this before it causes other problems. Requesting doxy. Pt denies any f/c/s/n/v, no wheezing, no chest tx, no increase SOB. Please advise Dr. Ashok Cordia thanks

## 2015-03-07 ENCOUNTER — Other Ambulatory Visit: Payer: Self-pay | Admitting: Adult Health

## 2015-03-15 ENCOUNTER — Ambulatory Visit: Payer: Medicare PPO | Admitting: Cardiovascular Disease

## 2015-03-16 ENCOUNTER — Encounter (HOSPITAL_COMMUNITY): Payer: Self-pay | Admitting: Nurse Practitioner

## 2015-03-16 ENCOUNTER — Ambulatory Visit (HOSPITAL_COMMUNITY)
Admit: 2015-03-16 | Discharge: 2015-03-16 | Disposition: A | Discharge status: Home / Self Care | Payer: Medicare Other | Source: Ambulatory Visit | Attending: Nurse Practitioner | Admitting: Nurse Practitioner

## 2015-03-16 VITALS — BP 126/80 | HR 71 | Ht 65.0 in | Wt 146.6 lb

## 2015-03-16 DIAGNOSIS — I480 Paroxysmal atrial fibrillation: Secondary | ICD-10-CM

## 2015-03-16 LAB — BASIC METABOLIC PANEL
Anion gap: 9 (ref 5–15)
BUN: 12 mg/dL (ref 6–20)
CO2: 25 mmol/L (ref 22–32)
Calcium: 9.2 mg/dL (ref 8.9–10.3)
Chloride: 107 mmol/L (ref 101–111)
Creatinine, Ser: 0.73 mg/dL (ref 0.44–1.00)
GFR calc Af Amer: >60 mL/min (ref >60)
GFR calc non Af Amer: >60 mL/min (ref >60)
Glucose, Bld: 76 mg/dL (ref 65–99)
Potassium: 4.3 mmol/L (ref 3.5–5.1)
Sodium: 141 mmol/L (ref 135–145)

## 2015-03-16 LAB — MAGNESIUM: Magnesium: 2.1 mg/dL (ref 1.7–2.4)

## 2015-03-16 NOTE — Progress Notes (Signed)
Patient ID: Elizabeth Mcconnell, female   DOB: 04-11-1945, 70 y.o.   MRN: [id number]     Primary Care Physician: [name], MD Referring Physician: Dr. Ronny Bacon Elizabeth Mcconnell is a 70 y.o. female with a h/o persistent afib, failed flecainide  in he past, and loading of tikosyn early December. Initially she was still having breakthrough afib but for the last week, her heart rhythm has smoothed out and she has been in SR more. She feels improved.  Today, she denies symptoms of palpitations, chest pain, shortness of breath, orthopnea, PND, lower extremity edema, dizziness, presyncope, syncope, or neurologic sequela. The patient is tolerating medications without difficulties and is otherwise without complaint today.   Past Medical History  Diagnosis Date  . Tobacco abuse   . Allergic rhinitis   . Oral candidiasis   . Atrial flutter ([place])     failed cardioversion 11/2012; s/p ablation 02-02-2013 by Dr Rayann Heman  . Asthma   . Pneumothorax on left 1996    left lower lung collapse s/p resection  . Osteopenia 08/2013     T score -2.3 FRAX 17%/3.0% stable from prior DEXA  . ALLERGIC RHINITIS   . Endometriosis   . Adenomatous polyp of colon 10/2013    f/u colo 10/2018  . Atrial fibrillation with RVR ([place])   . Essential hypertension 05/30/2014  . Diverticulosis   . Chronic bronchitis ([place])     "I'll have it most years" (02/14/2015)  . Arthritis     "neck, back, hands" (02/14/2015)  . ANXIETY     "situational" (02/14/2015)   Past Surgical History  Procedure Laterality Date  . Lung removal, partial Left 02/1965    LLL  . Cesarean section  1976; 1978  . Total abdominal hysterectomy w/ bilateral salpingoophorectomy  1984    Endometriosis  . Cardioversion N/A 11/27/2012    Procedure: CARDIOVERSION;  Surgeon: Thayer Headings, MD;  Location: [facility] ENDOSCOPY;  Service: Cardiovascular;  Laterality: N/A;  . Atrial flutter ablation N/A 02/02/2013    Procedure: ATRIAL FLUTTER ABLATION;  Surgeon:  Coralyn Mark, MD;  Location: [facility] CATH LAB;  Service: Cardiovascular;  Laterality: N/A;  . Appendectomy  1978    Current Outpatient Prescriptions  Medication Sig Dispense Refill  . acetaminophen (TYLENOL) 650 MG CR tablet Take 1,300 mg by mouth at bedtime. Second dose during day if needed    . diltiazem (CARDIZEM CD) 300 MG 24 hr capsule Take 1 capsule (300 mg total) by mouth at bedtime. 30 capsule 3  . dofetilide (TIKOSYN) 250 MCG capsule Take 1 capsule (250 mcg total) by mouth 2 (two) times daily. 14 capsule 0  . doxycycline (VIBRA-TABS) 100 MG tablet Take 1 tablet (100 mg total) by mouth 2 (two) times daily. 14 tablet 0  . ELIQUIS 5 MG TABS tablet TAKE 1 TABLET BY MOUTH 2 TIMES DAILY 60 tablet 5  . estradiol (VIVELLE-DOT) 0.075 MG/24HR PLACE ONE PATCH onto THE SKIN TWICE A WEEK 4 patch 0  . fluticasone (FLONASE) 50 MCG/ACT nasal spray Place 2 sprays into both nostrils twice daily. 16 g 3  . levalbuterol (XOPENEX HFA) 45 MCG/ACT inhaler Inhale 1-2 puffs into the lungs every 4 (four) hours as needed for shortness of breath.    . Linaclotide (LINZESS) 145 MCG CAPS capsule 145 mcg. Take one capsule (145 mcg) by mouth daily before breakfast    . LINZESS 145 MCG CAPS capsule TAKE 1 CAPSULE BY MOUTH EVERY DAY 30 capsule 5  . magnesium  oxide (MAG-OX) 400 MG tablet Take 1 tablet (400 mg total) by mouth daily. 30 tablet 6  . Melatonin 3 MG TABS Take 1 tablet by mouth daily as needed. For sleep    . mometasone-formoterol (DULERA) 200-5 MCG/ACT AERO Inhale 2 puffs into the lungs 2 (two) times daily. 13 g 11  . montelukast (SINGULAIR) 10 MG tablet TAKE 1 TABLET BY MOUTH EVERY DAY 30 tablet 2  . saccharomyces boulardii (FLORASTOR) 250 MG capsule Take 1 capsule (250 mg total) by mouth 2 (two) times daily. 60 capsule 1  . Sennosides-Docusate Sodium (PERI-COLACE PO) Take 1 tablet by mouth daily.     No current facility-administered medications for this encounter.    Allergies  Allergen Reactions  .  Tramadol Other (See Comments)    Severe dizziness, weakness  . Amoxicillin-Pot Clavulanate Nausea And Vomiting  . Flagyl [Metronidazole] Nausea And Vomiting  . Flecainide     dizziness  . Omnicef [Cefdinir] Nausea And Vomiting  . Avelox [Moxifloxacin] Hives, Itching and Rash    Social History   Social History  . Marital Status: Married    Spouse Name: N/A  . Number of Children: 2  . Years of Education: N/A   Occupational History  . 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     Social History Main Topics  . Smoking status: Current Every Day Smoker -- 0.25 packs/day for 43 years    Types: Cigarettes  . Smokeless tobacco: Never Used  . Alcohol Use: 18.0 oz/week    14 Standard drinks or equivalent, 16 Glasses of wine per week     Comment: daily wine  . Drug Use: No  . Sexual Activity: Yes    Birth Control/ Protection: Surgical, Post-menopausal     Comment: HYST-1st intercourse 70 yo-Fewer than 5 partners   Other Topics Concern  . Not on file   Social History Narrative   Lives with spouse;    2 children      [place] [facility]:   She is from [facility]. 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 Previously has attended business school. Previously did office work and Programmer, applications at [employer]. She has also worked for [employer]. She does have a cat currently. She has indoor plants. No mold exposure.    Family History  Problem Relation Age of Onset  . Stroke Father 51  . Hypertension Father   . Heart disease Father   . Diabetes Mother   . Colon cancer Neg Hx   . Rectal cancer Neg Hx   . Stomach cancer Neg Hx   . Asthma Maternal Grandfather   . Asthma Child     ROS- All systems are reviewed and negative except as per the HPI above  Physical Exam: Filed Vitals:   03/16/15 0938  BP: 126/80  Pulse: 71  Height: 5\' 5"  (1.651 m)  Weight: 146 lb 9.6 oz (66.497 kg)    GEN- The patient is well appearing, alert and oriented x 3 today.   Head-  normocephalic, atraumatic Eyes-  Sclera clear, conjunctiva pink Ears- hearing intact Oropharynx- clear Neck- supple, no JVP Lymph- no cervical lymphadenopathy Lungs- Clear to ausculation bilaterally, normal work of breathing Heart- Regular rate and rhythm, no murmurs, rubs or gallops, PMI not laterally displaced GI- soft, NT, ND, + BS Extremities- no clubbing, cyanosis, or edema MS- no significant deformity or atrophy Skin- no rash or lesion Psych- euthymic mood, full affect Neuro- strength and sensation are  intact  EKG-NSR at 71 bpm, pr int 178 ms, qrs int 102 ms, qtc 471 ms. IRBBB   Assessment and Plan: 1. Afib maintaining more SR on tikosyn for the last week QTc stable Continue tiksoyn 250 mg bid Bmet/mag today  F/u as scheduled with Dr. Rayann Heman 3/16.  Geroge Baseman Makinzie Considine, [facility] [address] [place], [place] [address] [phone]

## 2015-03-20 ENCOUNTER — Encounter: Payer: Self-pay | Admitting: Internal Medicine

## 2015-03-20 ENCOUNTER — Ambulatory Visit (INDEPENDENT_AMBULATORY_CARE_PROVIDER_SITE_OTHER): Payer: Medicare Other | Admitting: Internal Medicine

## 2015-03-20 VITALS — BP 130/74 | HR 72 | Ht 63.5 in | Wt 145.2 lb

## 2015-03-20 DIAGNOSIS — K5909 Other constipation: Secondary | ICD-10-CM

## 2015-03-20 DIAGNOSIS — K5900 Constipation, unspecified: Secondary | ICD-10-CM

## 2015-03-20 DIAGNOSIS — K5730 Diverticulosis of large intestine without perforation or abscess without bleeding: Secondary | ICD-10-CM | POA: Diagnosis not present

## 2015-03-20 DIAGNOSIS — Z8719 Personal history of other diseases of the digestive system: Secondary | ICD-10-CM | POA: Diagnosis not present

## 2015-03-20 MED ORDER — LEVOFLOXACIN 500 MG PO TABS: 500.0000 mg | ORAL_TABLET | Freq: Every day | ORAL | Status: DC

## 2015-03-20 NOTE — Progress Notes (Signed)
Subjective:    Patient ID: Elizabeth Mcconnell, female    DOB: 1945-07-04, 70 y.o.   MRN: [id number]  HPI Elizabeth Mcconnell is a  69 year old female with history of severe sigmoid diverticulosis, history of diverticulitis, constipation, and adenomatous colon polyps is here for follow-up. She was last seen in the office in November 2016. At that time she was having some left-sided abdominal pain and had been treated for 2 flares of diverticulitis since August 2016. Several weeks after seeing me and just after Thanksgiving 2016 she had return of her left lower quadrant abdominal pain. She was again treated with Bactrim DS twice a day 7 days and symptoms resolved. She tried samples of Lialda for possible segmental colitis associated with diverticulosis but stopped these because it didn't seem to help. Her left lower quadrant soreness has completely resolved. She is using Linzess 145 g daily and Colace 1 capsule at bedtime. [name] having 2-3 stools per day which are soft and formed. She overall is feeling well and has a good appetite but reports she does fear another episode of diverticulitis.   She was hospitalized to begin dofetilide under the direction of Dr. Burt Knack and Dr. Rayann Heman in December 2016. This has been controlling her A. Fib. She continues on Eliquis. she denies any bleeding , blood in her stool or melena.   She has continued  Twice daily Florastor   Review of Systems  as per history of present illness, otherwise negative  Current Medications, Allergies, Past Medical History, Past Surgical History, Family History and Social History were reviewed in [employer] record.       Objective:   Physical Exam BP 130/74 mmHg  Pulse 72  Ht 5' 3.5" (1.613 m)  Wt 145 lb 4 oz (65.885 kg)  BMI 25.32 kg/m2 Constitutional: Well-developed and well-nourished. No distress. HEENT: Normocephalic and atraumatic.Conjunctivae are normal.  No scleral icterus. Neck: Neck supple.  Trachea midline. Cardiovascular: Normal rate, regular rhythm and intact distal pulses. Pulmonary/chest: Effort normal and breath sounds normal. No wheezing, rales or rhonchi. Abdominal: Soft, nontender, nondistended. Bowel sounds active throughout. There are no masses palpable. No hepatosplenomegaly. Extremities: no clubbing, cyanosis, or edema Neurological: Alert and oriented to person place and time. Skin: Skin is warm and dry.  Psychiatric: Normal mood and affect. Behavior is normal.  CBC    Component Value Date/Time   WBC 10.0 10/19/2014 0009   RBC 4.73 10/19/2014 0009   HGB 14.1 10/19/2014 0009   HCT 43.5 10/19/2014 0009   PLT 233 10/19/2014 0009   MCV 92.0 10/19/2014 0009   MCH 29.8 10/19/2014 0009   MCHC 32.4 10/19/2014 0009   RDW 13.7 10/19/2014 0009   LYMPHSABS 2.2 06/06/2014 1225   MONOABS 0.5 06/06/2014 1225   EOSABS 0.2 06/06/2014 1225   BASOSABS 0.0 06/06/2014 1225    Last colonoscopy 10/27/2013, Dr. Dwain Sarna -- sigmoid colon polyp which was removed and found to be tubular adenoma. Severe diverticulosis in the sigmoid with angulation, tortuosity, muscular hypertrophy and colonic narrowing. This exam was completed to the cecum     Assessment & Plan:   70 year old female with history of severe sigmoid diverticulosis, history of diverticulitis, constipation, and adenomatous colon polyps is here for follow-up.   1. Recurrent sigmoid diverticulitis --  Unfortunately she had a another episode of diverticulitis in November  2016 successfully treated with Bactrim 1 week. This was her third flare since August 2016 all requiring antibiotic treatment.   Currently symptoms have resolved  and she is feeling well but she fears another episode of diverticulitis. I discussed surgical consultation for possible elective sigmoidectomy to prevent further complications and recurrent diverticulitis. I also think this would improve her bowel habits/function. She would like to think about  this and discuss this with her husband and she will let me know if interested. If so she would be referred to Dr. Zella Richer Dr. Marcello Moores for consideration of laparoscopic sigmoidectomy. -- she has an upcoming trip for 2 weeks to [place]  And requests antibiotic should she get sick while there. Bactrim worked well but has significant interaction with dofetilide. She also has several other allergies. Prescription provided for levofloxacin 500 mg to be used once daily 1 week should diverticulitis symptoms recur. If they do recur and she uses the antibiotic she is asked to notify me.   2. Constipation --  Likely in part related to diverticulitis. Currently Linzess is working well at 145 g daily. Continue this dose. She can continue Colace at night on an as-needed basis if she feels necessary   6 month follow-up, sooner if necessary  25 minutes spent with the patient today. Greater than 50% was spent in counseling and coordination of care with the patient

## 2015-03-20 NOTE — Patient Instructions (Addendum)
Continue Linzess 145 mcg daily.  Follow up with Dr Hilarie Fredrickson in 6 months.  We have discussed surgical consult for segmental resection. Please let us know if you decide to go forward with this.  We have sent the following medications to your pharmacy for you to pick up at your convenience: Levaquin 500 mg once daily x 5 days ONLY if needed for symptom relief while on your trip.  If you need to use the Levaquin on your trip, please let us know that you did use it.

## 2015-03-23 ENCOUNTER — Other Ambulatory Visit: Payer: Self-pay | Admitting: Pulmonary Disease

## 2015-03-23 ENCOUNTER — Telehealth: Payer: Self-pay | Admitting: Pulmonary Disease

## 2015-03-23 MED ORDER — MOMETASONE FURO-FORMOTEROL FUM 200-5 MCG/ACT IN AERO: 2.0000 | INHALATION_SPRAY | Freq: Two times a day (BID) | RESPIRATORY_TRACT | Status: DC

## 2015-03-23 NOTE — Telephone Encounter (Signed)
Called spoke with pt. She needs her xopenex and dulera refilled and these also require PA. I have sent in refills. [facility]-spoke with Latoya. She reports the xopenex does not need PA but dulera does. She is faxing the PA over on this. Will await fax

## 2015-03-23 NOTE — Telephone Encounter (Signed)
This is an urgent request for the dulera and xopenex needing to get PA back to them asap.Elizabeth Mcconnell

## 2015-03-24 MED ORDER — MOMETASONE FURO-FORMOTEROL FUM 200-5 MCG/ACT IN AERO: 2.0000 | INHALATION_SPRAY | Freq: Two times a day (BID) | RESPIRATORY_TRACT | Status: DC

## 2015-03-24 NOTE — Telephone Encounter (Signed)
PA for [facility] has not been received. I checked JN's look at and up front. Attempted to call [facility]. They are currently closed. Will call back later.

## 2015-03-24 NOTE — Telephone Encounter (Signed)
[facility] called- is there any way we can give the pt some samples to last until the rx come in? Please call pt back at home number.

## 2015-03-24 NOTE — Telephone Encounter (Signed)
PA initiation form received for pt's dulera rx.   Initiated through [email or web address] Key [id number]  Will hold to follow up on

## 2015-03-24 NOTE — Telephone Encounter (Signed)
Spoke with pt, aware that dulera sample is up front for pickup.   Will continue to hold message to follow up on PA

## 2015-03-27 NOTE — Telephone Encounter (Signed)
PA started on CMM Key # [id number] The plan will fax you a determination, typically within 1 to 5 business days.   Will await decision

## 2015-03-28 NOTE — Telephone Encounter (Signed)
Checked CMM, PA still under review.

## 2015-03-30 NOTE — Telephone Encounter (Signed)
Status: Sent to Plan on January 23rd, 2017  The plan will fax you a determination, typically within 1 to 5 business days.  Drug: Ruthe Mannan 200-5MCG/ACT aerosol  Form: [employer] Medicare Prior Authorization Form for [place] [employer]: [phone]  Fax: [phone]  Called [employer], Ruthe Mannan has been approved through 03/03/16 [place] to advise them that [facility] has been approved  Nothing further needed.

## 2015-04-03 ENCOUNTER — Telehealth: Payer: Self-pay | Admitting: *Deleted

## 2015-04-03 NOTE — Telephone Encounter (Signed)
Prior Authorization for vivelle-dot patch 0.05 mg filled out online via cover my meds, will wait for response.

## 2015-04-04 ENCOUNTER — Encounter: Payer: Self-pay | Admitting: *Deleted

## 2015-04-04 NOTE — Telephone Encounter (Signed)
Medication denied appeal letter faxed to [phone]

## 2015-04-07 NOTE — Telephone Encounter (Signed)
Appeal was approved authorization # [id number] valid 04/03/15-04/04/15

## 2015-04-10 ENCOUNTER — Other Ambulatory Visit: Payer: Self-pay | Admitting: Cardiovascular Disease

## 2015-04-12 ENCOUNTER — Telehealth: Payer: Self-pay | Admitting: Cardiovascular Disease

## 2015-04-12 ENCOUNTER — Telehealth: Payer: Self-pay | Admitting: Pulmonary Disease

## 2015-04-12 NOTE — Telephone Encounter (Signed)
New message      Need prior approval for eliquis 5mg .  New insurance for 2017.  Please fax it to [phone].  Pt thinks we should go ahead and get approval for tikosyn also.

## 2015-04-12 NOTE — Telephone Encounter (Signed)
Called and spoke with the patient. She states that she has received a letter from her insurance company stating that they had been trying to contact our office and was unsuccessful. It also stated that she needed to contact our office to discuss this with JN. I explained to her that the Ruthe Mannan was approved on 03/30/15 and we have not had any other contact from [employer]. She also states she received a letter from [employer] stating that Ord needs to send a letter of necessity for her xopenx inhaler and faxed to [phone]. I explained to her that Durene Cal is out of the office until 04/17/15 and he will address the letter upon his return. She stated that she had enough for this month and voiced understanding and had no further questions.  JN please advise

## 2015-04-12 NOTE — Telephone Encounter (Signed)
Pt returned call stating that she spoke with [facility]. She stated that they informed her that they faxed a letter to our office to change her Dulera to a different tier. I informed her that we had not received any form from them. I explained to her that I would call [facility] and discuss with them what information they needed. She voiced understanding and had no further questions.   Called [phone] and spoke with Lelon Frohlich. She stated she is faxing the letter to [phone]. Will await fax.

## 2015-04-13 NOTE — Telephone Encounter (Addendum)
Fax that was received was for a PA on dulera which we already received approval back 03/23/15. I called [facility] and spoke with Caryl Pina at [phone]. Did tier cost exception over the phone This was sent to clinical pharmacists and will receive a response in 72 hrs. Reference #[id number]

## 2015-04-17 ENCOUNTER — Telehealth: Payer: Self-pay

## 2015-04-17 NOTE — Telephone Encounter (Signed)
Received denial for lower copay "tier exception". dulera tier exception is denied d/t must try/fail breo ellipta and symbicort. LMTCB

## 2015-04-17 NOTE — Telephone Encounter (Signed)
Prior auth for Eliquis 5 mg sent to [facility]. 

## 2015-04-18 NOTE — Telephone Encounter (Signed)
Pt aware that her medication will need to be changed - aware that we will call her as soon as we know of the new medication.  Preferred drugs are Breo and Symbicort. Pt states that she has been on Advair in the past and tolerated that well, not sure if this is covered by insurance or if you feel that she will benefit from this medication. Please advise Dr Ashok Cordia. Thanks.

## 2015-04-18 NOTE — Telephone Encounter (Signed)
LMTCB with spouse 

## 2015-04-18 NOTE — Telephone Encounter (Signed)
Patient informed, Eliquis approved.

## 2015-04-18 NOTE — Telephone Encounter (Signed)
Pt returning call.Elizabeth Mcconnell ° °

## 2015-04-19 ENCOUNTER — Telehealth: Payer: Self-pay | Admitting: Internal Medicine

## 2015-04-19 NOTE — Telephone Encounter (Signed)
Change to Symbicort 160/4.5 - 2 inhalations bid. #1 inhaler 3 refills

## 2015-04-20 MED ORDER — BUDESONIDE-FORMOTEROL FUMARATE 160-4.5 MCG/ACT IN AERO: 2.0000 | INHALATION_SPRAY | Freq: Two times a day (BID) | RESPIRATORY_TRACT | Status: DC

## 2015-04-20 NOTE — Telephone Encounter (Signed)
Addendum: pt notes that although she is having frequent bowel movements, she only has small ball like stools each time and is unable to evacuate completely.

## 2015-04-20 NOTE — Telephone Encounter (Signed)
Left message for patient to call back  

## 2015-04-20 NOTE — Telephone Encounter (Signed)
Patient will take antibiotics previously given by Dr Hilarie Fredrickson and will call back to let us know how she is doing.

## 2015-04-20 NOTE — Telephone Encounter (Signed)
Patient returned phone call. Best # [phone]

## 2015-04-20 NOTE — Telephone Encounter (Signed)
Are these symptoms consistent with her prior episodes of diverticulitis? If so she can take the antibiotics as given to her from Dr. Hilarie Fredrickson and see if this helps. If she is having diarrhea we can send for C Diff but sounds like she is having formed stools

## 2015-04-20 NOTE — Telephone Encounter (Signed)
Spoke to patient this morning. She states that she has started having some "stomach issues" again. She is complaining of left sided abdominal "gurgling" and lower abdominal pain with tenderness to touch. She notes that she had about 20 bowel movements Saturday and has had frequent bowel movements since. No fever. No blood noted in stool. States that she was given Levaquin to take on a trip in case she needed it for segmental colitis but did not need to take it. After returning from a trip to [place], she developed these symptoms. Please advise (of note, last office note indicates that we recommended referral to surgeon to discuss colon resection but patient has since decided against this for now).

## 2015-04-20 NOTE — Telephone Encounter (Signed)
Called and spoke with the pt. Informed her of JN's recs and verified pharmacy as [facility]. She voiced understanding and had no further questions. Rx sent. Nothing further needed.

## 2015-04-24 ENCOUNTER — Other Ambulatory Visit: Payer: Self-pay | Admitting: Adult Health

## 2015-04-24 NOTE — Telephone Encounter (Signed)
Patient called back to let us know that the antibiotics (levaquin) given previously by Dr Hilarie Fredrickson did not work. She has been extremely constipated and is having lower abdominal cramping and back discomfort now. She is currently taking Linzess 145 mcg and 1 capful of Miralax daily. I have asked that she increase Miralax to 1 capful twice daily for now and push fluids. She currently does not have a prescription for antispasmotic. Can we send her something for the cramps? Any additional suggestions? Patient would also like to talk to Dr Hilarie Fredrickson about possible surgical referral again (she was previously against this). Dr Hilarie Fredrickson, would you like to call patient or do you want her to be seen next available OV (not until 06-19-15)?

## 2015-04-25 NOTE — Telephone Encounter (Signed)
Trial of Robinul forte twice a day as needed for cramping I will call her tomorrow. Please put reminder on my desk

## 2015-04-25 NOTE — Telephone Encounter (Signed)
Patient actually now states that she found some dicyclomine that the emergency room gave her back last year. I advised that she may take that instead of picking up the robinul since she already has it available. Patient advised that Dr Hilarie Fredrickson will call her tomorrow.

## 2015-04-27 ENCOUNTER — Telehealth: Payer: Self-pay | Admitting: Internal Medicine

## 2015-04-27 NOTE — Telephone Encounter (Signed)
Pt called back and states she was told Dr. Hilarie Fredrickson would be calling her Wednesday. Please advise.

## 2015-04-28 ENCOUNTER — Telehealth: Payer: Self-pay | Admitting: Internal Medicine

## 2015-04-28 ENCOUNTER — Other Ambulatory Visit (INDEPENDENT_AMBULATORY_CARE_PROVIDER_SITE_OTHER): Payer: Medicare Other

## 2015-04-28 ENCOUNTER — Encounter: Payer: Self-pay | Admitting: Internal Medicine

## 2015-04-28 ENCOUNTER — Ambulatory Visit (INDEPENDENT_AMBULATORY_CARE_PROVIDER_SITE_OTHER): Payer: Medicare Other | Admitting: Internal Medicine

## 2015-04-28 VITALS — BP 128/78 | HR 76 | Ht 63.5 in | Wt 147.2 lb

## 2015-04-28 DIAGNOSIS — K5732 Diverticulitis of large intestine without perforation or abscess without bleeding: Secondary | ICD-10-CM

## 2015-04-28 DIAGNOSIS — K5900 Constipation, unspecified: Secondary | ICD-10-CM

## 2015-04-28 DIAGNOSIS — K5730 Diverticulosis of large intestine without perforation or abscess without bleeding: Secondary | ICD-10-CM | POA: Diagnosis not present

## 2015-04-28 DIAGNOSIS — R1032 Left lower quadrant pain: Secondary | ICD-10-CM

## 2015-04-28 DIAGNOSIS — R109 Unspecified abdominal pain: Secondary | ICD-10-CM

## 2015-04-28 DIAGNOSIS — K50119 Crohn's disease of large intestine with unspecified complications: Secondary | ICD-10-CM | POA: Diagnosis not present

## 2015-04-28 LAB — CBC WITH DIFFERENTIAL/PLATELET
Basophils Absolute: 0.1 10*3/uL (ref 0.0–0.1)
Basophils Relative: 1.2 % (ref 0.0–3.0)
Eosinophils Absolute: 0 10*3/uL (ref 0.0–0.7)
Eosinophils Relative: 0.4 % (ref 0.0–5.0)
HCT: 40.2 % (ref 36.0–46.0)
Hemoglobin: 13.5 g/dL (ref 12.0–15.0)
Lymphocytes Relative: 23.3 % (ref 12.0–46.0)
Lymphs Abs: 2.1 10*3/uL (ref 0.7–4.0)
MCHC: 33.5 g/dL (ref 30.0–36.0)
MCV: 87.7 fl (ref 78.0–100.0)
Monocytes Absolute: 0.6 10*3/uL (ref 0.1–1.0)
Monocytes Relative: 7 % (ref 3.0–12.0)
Neutro Abs: 6.2 10*3/uL (ref 1.4–7.7)
Neutrophils Relative %: 68.1 % (ref 43.0–77.0)
Platelets: 265 10*3/uL (ref 150.0–400.0)
RBC: 4.58 Mil/uL (ref 3.87–5.11)
RDW: 13.8 % (ref 11.5–15.5)
WBC: 9.1 10*3/uL (ref 4.0–10.5)

## 2015-04-28 LAB — COMPREHENSIVE METABOLIC PANEL
ALT: 9 U/L (ref 0–35)
AST: 13 U/L (ref 0–37)
Albumin: 4.3 g/dL (ref 3.5–5.2)
Alkaline Phosphatase: 86 U/L (ref 39–117)
BUN: 11 mg/dL (ref 6–23)
CO2: 27 mEq/L (ref 19–32)
Calcium: 9.3 mg/dL (ref 8.4–10.5)
Chloride: 103 mEq/L (ref 96–112)
Creatinine, Ser: 0.74 mg/dL (ref 0.40–1.20)
GFR: 82.09 mL/min (ref >60.00)
Glucose, Bld: 89 mg/dL (ref 70–99)
Potassium: 3.8 mEq/L (ref 3.5–5.1)
Sodium: 137 mEq/L (ref 135–145)
Total Bilirubin: 0.5 mg/dL (ref 0.2–1.2)
Total Protein: 7.4 g/dL (ref 6.0–8.3)

## 2015-04-28 LAB — HIGH SENSITIVITY CRP: CRP, High Sensitivity: 39.43 mg/L — ABNORMAL HIGH (ref 0.000–5.000)

## 2015-04-28 MED ORDER — MOVIPREP 100 G PO SOLR: 1.0000 | Freq: Once | ORAL | Status: DC

## 2015-04-28 NOTE — Patient Instructions (Addendum)
Your physician has requested that you go to the basement for the following lab work before leaving today: CBC, CMP, CRP  Continue Linzess, pericolace, and Miralax.  Please restart your Lialda 2 tablets daily.  Use the Moviprep sample we have given you to purge your bowels. You should follow directions written on the side of the box given to you today. _____________________________________________________________________________________________________________ You have been scheduled for a CT scan of the abdomen and pelvis at [facility] ([address]---this is in the same building as [facility]).   You are scheduled on Monday, 05/01/15 at 3:45 pm. You should arrive 15 minutes prior to your appointment time for registration. Please follow the written instructions below on the day of your exam:  WARNING: IF YOU ARE ALLERGIC TO IODINE/X-RAY DYE, PLEASE NOTIFY RADIOLOGY IMMEDIATELY AT [phone]! YOU WILL BE GIVEN A 13 HOUR PREMEDICATION PREP.  1) Do not eat or drink anything after 11:45 am (4 hours prior to your test) 2) You have been given 2 bottles of oral contrast to drink. The solution may taste better if refrigerated, but do NOT add ice or any other liquid to this solution. Shake well before drinking.    Drink 1 bottle of contrast @ 1:45 pm (2 hours prior to your exam)  Drink 1 bottle of contrast @ 2:45 pm (1 hour prior to your exam)  You may take any medications as prescribed with a small amount of water except for the following: Metformin, Glucophage, Glucovance, Avandamet, Riomet, Fortamet, Actoplus Met, Janumet, Glumetza or Metaglip. The above medications must be held the day of the exam AND 48 hours after the exam.  The purpose of you drinking the oral contrast is to aid in the visualization of your intestinal tract. The contrast solution may cause some diarrhea. Before your exam is started, you will be given a small amount of fluid to drink. Depending on your  individual set of symptoms, you may also receive an intravenous injection of x-ray contrast/dye. Plan on being at [facility] for 30 minutes or longer, depending on the type of exam you are having performed.  This test typically takes 30-45 minutes to complete.  If you have any questions regarding your exam or if you need to reschedule, you may call the CT department at [phone] between the hours of 8:00 am and 5:00 pm, Monday-Friday.  ________________________________________________________________________

## 2015-04-28 NOTE — Progress Notes (Signed)
Subjective:    Patient ID: Elizabeth Mcconnell, female    DOB: 05-29-1945, 70 y.o.   MRN: [id number]  HPI Elizabeth Mcconnell is a 70 year old female with a history of severe sigmoid diverticulosis, history of diverticulitis, constipation, and adenomatous colon polyps who is here for urgent follow-up. She is here today with her husband. She was last seen on 03/20/2015. At the time of her last visit she was feeling well but over the last several months to a year has been dealing with frequent bouts of left-sided abdominal pain. 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 Bactrim in the past worked well for her left-sided abdominal pain but can no longer be used given that she is now on dofetilide. At the time for her last office visit she had vacation plan to [place] and [place].  Prescription for levofloxacin was given in case abdominal symptoms started there. Abdominal symptoms did not start while on vacation but she did develop sinus pain and pressure and took a course of doxycycline. Several days after returning home around 04/14/2015 she developed crampy left-sided abdominal pain associated with left lower quadrant borborygmi. She then had a day of severe diarrhea but since then has been dealing with off and on crampy left-sided abdominal discomfort and overall feeling of malaise and feeling "bad". She did take the Levaquin previously prescribed from 04/19/2015 to 04/24/2015. She did not tell that this helped at all with her discomfort. [name] had chills but no definite or documented fevers. Bowel habits have remained sporadic despite Linzess 145 g daily, Peri-Colace daily, and MiraLAX once daily now twice daily over the last 5 days. Sunday she is not having a bowel movement and on other days bowel movements are soft and small little pieces of stool. She denies rectal bleeding or melena. Appetite  overall has been decreased. [name] been eating a low residue diet with foods such as soups. She does feel bloating in her abdomen. She has also continued Florastor twice a day  She continues on dofetilide and Eliquis under the direction of Dr. Burt Knack and Allred   Review of Systems As per history of present illness, otherwise negative  Current Medications, Allergies, Past Medical History, Past Surgical History, Family History and Social History were reviewed in [employer] record.     Objective:   Physical Exam BP 128/78 mmHg  Pulse 76  Ht 5' 3.5" (1.613 m)  Wt 147 lb 4 oz (66.792 kg)  BMI 25.67 kg/m2 Constitutional: Well-developed and well-nourished. No distress. HEENT: Normocephalic and atraumatic. Conjunctivae are normal.  No scleral icterus. Neck: Neck supple. Trachea midline. Cardiovascular: Normal rate, regular rhythm and intact distal pulses. No M/R/G Pulmonary/chest: Effort normal and breath sounds normal. No wheezing, rales or rhonchi. Abdominal: Soft, mildly distended mid and lower abdomen with left sided tenderness to palpation without rebound or guarding  Bowel sounds active throughout.  Extremities: no clubbing, cyanosis, or edema Neurological: Alert and oriented to person place and time. Skin: Skin is warm and dry. Psychiatric: Normal mood and affect. Behavior is normal.  CBC    Component Value Date/Time   WBC 9.1 04/28/2015 1641   RBC 4.58 04/28/2015 1641   HGB 13.5 04/28/2015 1641   HCT 40.2 04/28/2015 1641   PLT 265.0 04/28/2015 1641   MCV 87.7 04/28/2015 1641   MCH 29.8 10/19/2014 0009   MCHC 33.5 04/28/2015 1641   RDW 13.8 04/28/2015 1641  LYMPHSABS 2.1 04/28/2015 1641   MONOABS 0.6 04/28/2015 1641   EOSABS 0.0 04/28/2015 1641   BASOSABS 0.1 04/28/2015 1641    CMP     Component Value Date/Time   NA 137 04/28/2015 1641   K 3.8 04/28/2015 1641   CL 103 04/28/2015 1641   CO2 27 04/28/2015 1641   GLUCOSE 89 04/28/2015 1641    BUN 11 04/28/2015 1641   CREATININE 0.74 04/28/2015 1641   CREATININE 0.80 02/22/2015 1527   CALCIUM 9.3 04/28/2015 1641   PROT 7.4 04/28/2015 1641   ALBUMIN 4.3 04/28/2015 1641   AST 13 04/28/2015 1641   ALT 9 04/28/2015 1641   ALKPHOS 86 04/28/2015 1641   BILITOT 0.5 04/28/2015 1641   GFRNONAA >60 03/16/2015 1013   GFRAA >60 03/16/2015 1013       Assessment & Plan:  70 year old female with a history of severe sigmoid diverticulosis, history of diverticulitis, constipation, and adenomatous colon polyps who is here for urgent follow-up  1. 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 At the time of her last colonoscopy (reviewed today) in August 2015 she had severe sigmoid diverticulosis with angulation, tortuosity and luminal narrowing. She also had a tubular adenoma. It is likely that her severe sigmoid diverticulosis is contributing to these symptoms, incomplete evacuation of the colon and constipation. --CT abdomen and pelvis with contrast, rule out acute diverticulitis or diverticular complication --CBC and CMP normal today which is reassuring --MoviPrep sample given to purge bowel is some of her symptoms are likely worsened by constipation. She will start with half of the prep and if it is sufficient she can stop with only having consumed half. She can take the second half if necessary --After purge prep, resume Linzess 145 g daily, Peri-Colace and MiraLAX twice a day --Restart Lialda 2.4 g daily for likely component of segmental colitis associated with diverticulosis --Hold probiotic  for now as no appreciable benefit currently  --We had a frank discussion today regarding sigmoidectomy which I think would help her symptoms tremendously. This would likely be possible laparoscopically which should hasten recovery. She is very open to this consideration. Will review CT scan next week and likely refer to [facility] for consideration of elective sigmoid resection  45 minutes spent with the patient today. Greater than 50% was spent in counseling and coordination of care with the patient

## 2015-04-28 NOTE — Telephone Encounter (Signed)
Pt states she thinks she is having a flare of her diverticulitis. Pt scheduled to see Dr. Hilarie Fredrickson today at 3:30pm. Pt aware of appt.

## 2015-04-28 NOTE — Telephone Encounter (Signed)
OV today See office note

## 2015-04-28 NOTE — Telephone Encounter (Signed)
she states that she is having a diverticulitis flare-up. She states that she was told that Dr. Hilarie Fredrickson was going to call last Wednesday. She states that she is very sick and does not want to go to the hospital

## 2015-04-29 ENCOUNTER — Telehealth: Payer: Self-pay | Admitting: Gastroenterology

## 2015-04-29 NOTE — Telephone Encounter (Signed)
Agree with plan. If recommendations do not work she should come to the ER for CT

## 2015-04-29 NOTE — Telephone Encounter (Signed)
Returned call/page from patient.  Seen by Dr. Hilarie Fredrickson yesterday, 2/24.  Office note reviewed.  Drank half dose moviprep last night and had severe nausea all night.  Some vomiting but mostly severe nausea.  Still has not moved her bowels despite drinking that.  Denies any significant abdominal pain.  Currently nausea is subsiding.  Asking what to do.  I have asked her to get a fleets enema and to administer that today as soon as she can to clean things from the bottom.  Resume her Miralax, pericolace, and Linzess as directed by Dr. Hilarie Fredrickson.  Can use Zofran that she has at home for nausea if needed.  Says that she has not eaten so feels shaky.  Told her to get something on her stomach and to start with something bland like crackers or toast.  Needs to come to the ED if develops significant pain, continued/recurrent intractable nausea/vomiting, fevers, continues without BM, etc.  Is scheduled for CT scan on 2/27 afternoon.

## 2015-04-30 NOTE — Telephone Encounter (Signed)
Elizabeth Mcconnell Please call pt Monday morning to see if she is feeling better. I do not see she went to the ED

## 2015-05-01 ENCOUNTER — Telehealth: Payer: Self-pay | Admitting: Internal Medicine

## 2015-05-01 ENCOUNTER — Inpatient Hospital Stay: Admission: RE | Admit: 2015-05-01 | Payer: Medicare Other | Source: Ambulatory Visit

## 2015-05-01 ENCOUNTER — Inpatient Hospital Stay (HOSPITAL_COMMUNITY)
Admission: EM | Admit: 2015-05-01 | Discharge: 2015-05-09 | DRG: 330 | Disposition: A | Discharge status: Home / Self Care | Payer: Medicare Other | Attending: Internal Medicine | Admitting: Internal Medicine

## 2015-05-01 ENCOUNTER — Ambulatory Visit (INDEPENDENT_AMBULATORY_CARE_PROVIDER_SITE_OTHER)
Admission: RE | Admit: 2015-05-01 | Discharge: 2015-05-01 | Disposition: A | Discharge status: Home / Self Care | Payer: Medicare Other | Source: Ambulatory Visit | Attending: Internal Medicine | Admitting: Internal Medicine

## 2015-05-01 ENCOUNTER — Encounter (HOSPITAL_COMMUNITY): Payer: Self-pay | Admitting: Emergency Medicine

## 2015-05-01 ENCOUNTER — Telehealth: Payer: Self-pay | Admitting: *Deleted

## 2015-05-01 DIAGNOSIS — E876 Hypokalemia: Secondary | ICD-10-CM | POA: Diagnosis present

## 2015-05-01 DIAGNOSIS — I480 Paroxysmal atrial fibrillation: Secondary | ICD-10-CM | POA: Diagnosis present

## 2015-05-01 DIAGNOSIS — K5669 Other intestinal obstruction: Secondary | ICD-10-CM | POA: Diagnosis not present

## 2015-05-01 DIAGNOSIS — R935 Abnormal findings on diagnostic imaging of other abdominal regions, including retroperitoneum: Secondary | ICD-10-CM | POA: Diagnosis not present

## 2015-05-01 DIAGNOSIS — I10 Essential (primary) hypertension: Secondary | ICD-10-CM | POA: Diagnosis present

## 2015-05-01 DIAGNOSIS — D62 Acute posthemorrhagic anemia: Secondary | ICD-10-CM | POA: Diagnosis not present

## 2015-05-01 DIAGNOSIS — Z902 Acquired absence of lung [part of]: Secondary | ICD-10-CM | POA: Diagnosis not present

## 2015-05-01 DIAGNOSIS — I4891 Unspecified atrial fibrillation: Secondary | ICD-10-CM | POA: Diagnosis present

## 2015-05-01 DIAGNOSIS — Z8249 Family history of ischemic heart disease and other diseases of the circulatory system: Secondary | ICD-10-CM

## 2015-05-01 DIAGNOSIS — K56699 Other intestinal obstruction unspecified as to partial versus complete obstruction: Secondary | ICD-10-CM

## 2015-05-01 DIAGNOSIS — R1032 Left lower quadrant pain: Secondary | ICD-10-CM | POA: Diagnosis present

## 2015-05-01 DIAGNOSIS — F17210 Nicotine dependence, cigarettes, uncomplicated: Secondary | ICD-10-CM | POA: Diagnosis present

## 2015-05-01 DIAGNOSIS — K5732 Diverticulitis of large intestine without perforation or abscess without bleeding: Secondary | ICD-10-CM | POA: Diagnosis present

## 2015-05-01 DIAGNOSIS — Z0000 Encounter for general adult medical examination without abnormal findings: Secondary | ICD-10-CM | POA: Insufficient documentation

## 2015-05-01 DIAGNOSIS — Z7901 Long term (current) use of anticoagulants: Secondary | ICD-10-CM

## 2015-05-01 DIAGNOSIS — Z90710 Acquired absence of both cervix and uterus: Secondary | ICD-10-CM | POA: Diagnosis not present

## 2015-05-01 DIAGNOSIS — R6883 Chills (without fever): Secondary | ICD-10-CM | POA: Diagnosis not present

## 2015-05-01 DIAGNOSIS — K921 Melena: Secondary | ICD-10-CM | POA: Diagnosis not present

## 2015-05-01 DIAGNOSIS — K56609 Unspecified intestinal obstruction, unspecified as to partial versus complete obstruction: Secondary | ICD-10-CM | POA: Diagnosis present

## 2015-05-01 DIAGNOSIS — Z881 Allergy status to other antibiotic agents status: Secondary | ICD-10-CM | POA: Diagnosis not present

## 2015-05-01 DIAGNOSIS — K5900 Constipation, unspecified: Secondary | ICD-10-CM

## 2015-05-01 DIAGNOSIS — R109 Unspecified abdominal pain: Secondary | ICD-10-CM | POA: Diagnosis present

## 2015-05-01 DIAGNOSIS — I481 Persistent atrial fibrillation: Secondary | ICD-10-CM | POA: Diagnosis not present

## 2015-05-01 DIAGNOSIS — J4540 Moderate persistent asthma, uncomplicated: Secondary | ICD-10-CM | POA: Diagnosis present

## 2015-05-01 DIAGNOSIS — D72829 Elevated white blood cell count, unspecified: Secondary | ICD-10-CM | POA: Diagnosis present

## 2015-05-01 DIAGNOSIS — J4541 Moderate persistent asthma with (acute) exacerbation: Secondary | ICD-10-CM | POA: Diagnosis not present

## 2015-05-01 DIAGNOSIS — Z880 Allergy status to penicillin: Secondary | ICD-10-CM

## 2015-05-01 DIAGNOSIS — R1030 Lower abdominal pain, unspecified: Secondary | ICD-10-CM

## 2015-05-01 DIAGNOSIS — K5660 Unspecified intestinal obstruction: Secondary | ICD-10-CM | POA: Diagnosis not present

## 2015-05-01 DIAGNOSIS — I4892 Unspecified atrial flutter: Secondary | ICD-10-CM | POA: Diagnosis present

## 2015-05-01 DIAGNOSIS — Z01810 Encounter for preprocedural cardiovascular examination: Secondary | ICD-10-CM | POA: Diagnosis not present

## 2015-05-01 DIAGNOSIS — K5730 Diverticulosis of large intestine without perforation or abscess without bleeding: Secondary | ICD-10-CM | POA: Diagnosis present

## 2015-05-01 LAB — URINALYSIS, ROUTINE W REFLEX MICROSCOPIC
Bilirubin Urine: NEGATIVE
Glucose, UA: NEGATIVE mg/dL
Hgb urine dipstick: NEGATIVE
Ketones, ur: 40 mg/dL — AB
Leukocytes, UA: NEGATIVE
Nitrite: NEGATIVE
Protein, ur: NEGATIVE mg/dL
Specific Gravity, Urine: >1.046 — ABNORMAL HIGH (ref 1.005–1.030)
pH: 5 (ref 5.0–8.0)

## 2015-05-01 LAB — CBC
HCT: 46.3 % — ABNORMAL HIGH (ref 36.0–46.0)
Hemoglobin: 15 g/dL (ref 12.0–15.0)
MCH: 29.6 pg (ref 26.0–34.0)
MCHC: 32.4 g/dL (ref 30.0–36.0)
MCV: 91.5 fL (ref 78.0–100.0)
Platelets: 290 10*3/uL (ref 150–400)
RBC: 5.06 MIL/uL (ref 3.87–5.11)
RDW: 13.5 % (ref 11.5–15.5)
WBC: 12.2 10*3/uL — ABNORMAL HIGH (ref 4.0–10.5)

## 2015-05-01 LAB — COMPREHENSIVE METABOLIC PANEL
ALT: 27 U/L (ref 14–54)
AST: 33 U/L (ref 15–41)
Albumin: 4.4 g/dL (ref 3.5–5.0)
Alkaline Phosphatase: 76 U/L (ref 38–126)
Anion gap: 14 (ref 5–15)
BUN: 10 mg/dL (ref 6–20)
CO2: 21 mmol/L — ABNORMAL LOW (ref 22–32)
Calcium: 9.1 mg/dL (ref 8.9–10.3)
Chloride: 101 mmol/L (ref 101–111)
Creatinine, Ser: 0.94 mg/dL (ref 0.44–1.00)
GFR calc Af Amer: >60 mL/min (ref >60)
GFR calc non Af Amer: 60 mL/min — ABNORMAL LOW (ref >60)
Glucose, Bld: 103 mg/dL — ABNORMAL HIGH (ref 65–99)
Potassium: 3.1 mmol/L — ABNORMAL LOW (ref 3.5–5.1)
Sodium: 136 mmol/L (ref 135–145)
Total Bilirubin: 1 mg/dL (ref 0.3–1.2)
Total Protein: 7.3 g/dL (ref 6.5–8.1)

## 2015-05-01 LAB — LIPASE, BLOOD: Lipase: 34 U/L (ref 11–51)

## 2015-05-01 MED ORDER — IOHEXOL 300 MG/ML  SOLN
100.0000 mL | Freq: Once | INTRAMUSCULAR | Status: AC | PRN
  Administered 2015-05-01: 100 mL via INTRAVENOUS

## 2015-05-01 MED ORDER — DILTIAZEM HCL ER COATED BEADS 180 MG PO CP24
300.0000 mg | ORAL_CAPSULE | Freq: Every day | ORAL | Status: DC
  Administered 2015-05-01 – 2015-05-08 (×8): 300 mg via ORAL
  Filled 2015-05-01 (×8): qty 1

## 2015-05-01 MED ORDER — MONTELUKAST SODIUM 10 MG PO TABS
10.0000 mg | ORAL_TABLET | Freq: Every day | ORAL | Status: DC
  Administered 2015-05-02 – 2015-05-09 (×7): 10 mg via ORAL
  Filled 2015-05-01 (×8): qty 1

## 2015-05-01 MED ORDER — MAGNESIUM OXIDE 400 (241.3 MG) MG PO TABS
400.0000 mg | ORAL_TABLET | Freq: Every day | ORAL | Status: DC
  Administered 2015-05-02 – 2015-05-05 (×4): 400 mg via ORAL
  Filled 2015-05-01 (×4): qty 1

## 2015-05-01 MED ORDER — SACCHAROMYCES BOULARDII 250 MG PO CAPS
250.0000 mg | ORAL_CAPSULE | Freq: Two times a day (BID) | ORAL | Status: DC
  Administered 2015-05-02 – 2015-05-09 (×14): 250 mg via ORAL
  Filled 2015-05-01 (×17): qty 1

## 2015-05-01 MED ORDER — DOFETILIDE 250 MCG PO CAPS
250.0000 ug | ORAL_CAPSULE | Freq: Two times a day (BID) | ORAL | Status: DC
  Administered 2015-05-01 – 2015-05-03 (×4): 250 ug via ORAL
  Filled 2015-05-01 (×4): qty 1

## 2015-05-01 MED ORDER — MORPHINE SULFATE (PF) 2 MG/ML IV SOLN: 1.0000 mg | INTRAVENOUS | Status: DC | PRN

## 2015-05-01 MED ORDER — ACETAMINOPHEN 325 MG PO TABS
650.0000 mg | ORAL_TABLET | Freq: Four times a day (QID) | ORAL | Status: DC | PRN
  Administered 2015-05-03 (×2): 650 mg via ORAL
  Filled 2015-05-01 (×2): qty 2

## 2015-05-01 MED ORDER — SODIUM CHLORIDE 0.9 % IV SOLN
INTRAVENOUS | Status: DC
  Administered 2015-05-01: 23:00:00 via INTRAVENOUS

## 2015-05-01 MED ORDER — MOMETASONE FURO-FORMOTEROL FUM 200-5 MCG/ACT IN AERO
2.0000 | INHALATION_SPRAY | Freq: Two times a day (BID) | RESPIRATORY_TRACT | Status: DC
  Administered 2015-05-02 – 2015-05-09 (×15): 2 via RESPIRATORY_TRACT
  Filled 2015-05-01: qty 8.8

## 2015-05-01 MED ORDER — HEPARIN (PORCINE) IN NACL 100-0.45 UNIT/ML-% IJ SOLN
800.0000 [IU]/h | INTRAMUSCULAR | Status: DC
  Administered 2015-05-01: 800 [IU]/h via INTRAVENOUS
  Filled 2015-05-01 (×2): qty 250

## 2015-05-01 MED ORDER — ACETAMINOPHEN 650 MG RE SUPP: 650.0000 mg | Freq: Four times a day (QID) | RECTAL | Status: DC | PRN

## 2015-05-01 MED ORDER — LINACLOTIDE 145 MCG PO CAPS
145.0000 ug | ORAL_CAPSULE | Freq: Every day | ORAL | Status: DC
  Administered 2015-05-02 – 2015-05-07 (×5): 145 ug via ORAL
  Filled 2015-05-01 (×7): qty 1

## 2015-05-01 MED ORDER — POTASSIUM CHLORIDE 10 MEQ/100ML IV SOLN
10.0000 meq | Freq: Once | INTRAVENOUS | Status: AC
  Administered 2015-05-01: 10 meq via INTRAVENOUS
  Filled 2015-05-01: qty 100

## 2015-05-01 MED ORDER — FLUTICASONE PROPIONATE 50 MCG/ACT NA SUSP
2.0000 | Freq: Every day | NASAL | Status: DC
  Administered 2015-05-02 – 2015-05-09 (×7): 2 via NASAL
  Filled 2015-05-01: qty 16

## 2015-05-01 MED ORDER — SODIUM CHLORIDE 0.9 % IV BOLUS (SEPSIS)
1000.0000 mL | Freq: Once | INTRAVENOUS | Status: AC
Start: 2015-05-01 — End: 2015-05-01
  Administered 2015-05-01: 1000 mL via INTRAVENOUS

## 2015-05-01 MED ORDER — ONDANSETRON HCL 4 MG/2ML IJ SOLN
4.0000 mg | Freq: Four times a day (QID) | INTRAMUSCULAR | Status: DC | PRN
  Administered 2015-05-04 (×3): 4 mg via INTRAVENOUS
  Filled 2015-05-01: qty 2

## 2015-05-01 MED ORDER — ONDANSETRON HCL 4 MG PO TABS: 4.0000 mg | ORAL_TABLET | Freq: Four times a day (QID) | ORAL | Status: DC | PRN

## 2015-05-01 NOTE — ED Notes (Signed)
Provided ice chips.  

## 2015-05-01 NOTE — ED Notes (Signed)
Pt presents with lower abdominal pain and constipation x 2 weeks.  Dr visit today included CT scan showing lower bowel obstruction.

## 2015-05-01 NOTE — Telephone Encounter (Signed)
See previous phone note from 04/29/15. Patient states that she continues to be very nauseated and is unable to really eat much. She has eaten a small amount of soup and oatmeal which she has been able to keep down.  She states that she never did get the fleets enema that was suggested this weekend (she was vomited up the Moviprep previously) because "about 30 minutes after talking to the P.A., I started having a little bit of liquid bowel movements." I explained that the fleets enema may still be a good idea since she has not had a bowel movement in quite sometime and the liquid is likely water coming out around the hard stool. She would like Dr Vena Rua opinion and would like to know what to do about the CT later today. She doesn't think she can drink the prep.

## 2015-05-01 NOTE — Telephone Encounter (Signed)
I would try fleet's enema x 1 I also would try some of the oral prep as tolerated.  If severe nausea or any vomiting, then stop. We need to proceed with CT scan today if at all possible to determine what is going on.  If she worsens prior to CT she should be seen in the ED for likely admission

## 2015-05-01 NOTE — Telephone Encounter (Signed)
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 Patient needs to be admitted to the hospital for further care, likely surgical involvement. I will notify inpatient GI team Patient will be sent to the ER urgently today

## 2015-05-01 NOTE — Progress Notes (Signed)
ANTICOAGULATION CONSULT NOTE - Initial Consult  Pharmacy Consult for IV Heparin Indication: atrial fibrillation > on eliquis PTA  Allergies  Allergen Reactions  . Tramadol Other (See Comments)    Severe dizziness, weakness  . Amoxicillin-Pot Clavulanate Nausea And Vomiting  . Flagyl [Metronidazole] Nausea And Vomiting  . Flecainide     dizziness  . Omnicef [Cefdinir] Nausea And Vomiting  . Avelox [Moxifloxacin] Hives, Itching and Rash    Patient Measurements:   Heparin Dosing Weight: 57 kg  Vital Signs: Temp: 98.1 F (36.7 C) (02/27 2154) Temp Source: Oral (02/27 2154) BP: 132/61 mmHg (02/27 2300) Pulse Rate: 115 (02/27 2300)  Labs:  Recent Labs  05/01/15 1915  HGB 15.0  HCT 46.3*  PLT 290  CREATININE 0.94    Estimated Creatinine Clearance: 51 mL/min (by C-G formula based on Cr of 0.94).   Medical History: Past Medical History  Diagnosis Date  . Tobacco abuse   . Allergic rhinitis   . Oral candidiasis   . Atrial flutter ([place])     failed cardioversion 11/2012; s/p ablation 02-02-2013 by Dr Rayann Heman  . Asthma   . Pneumothorax on left 1996    left lower lung collapse s/p resection  . Osteopenia 08/2013     T score -2.3 FRAX 17%/3.0% stable from prior DEXA  . ALLERGIC RHINITIS   . Endometriosis   . Adenomatous polyp of colon 10/2013    f/u colo 10/2018  . Atrial fibrillation with RVR ([place])   . Essential hypertension 05/30/2014  . Diverticulosis   . Chronic bronchitis ([place])     "I'll have it most years" (02/14/2015)  . Arthritis     "neck, back, hands" (02/14/2015)  . ANXIETY     "situational" (02/14/2015)    Medications:  Scheduled:  . diltiazem  300 mg Oral QHS  . dofetilide  250 mcg Oral BID  . [START ON 05/02/2015] fluticasone  2 spray Each Nare Daily  . [START ON 05/02/2015] Linaclotide  145 mcg Oral Daily  . [START ON 05/02/2015] magnesium oxide  400 mg Oral Daily  . [START ON 05/02/2015] mometasone-formoterol  2 puff Inhalation BID  . [START ON  05/02/2015] montelukast  10 mg Oral Daily  . saccharomyces boulardii  250 mg Oral BID   Infusions:  . sodium chloride 50 mL/hr at 05/01/15 2305    Assessment: 71 yoF c/o abdominal pain and constipation on Eliquis for A-fib.  IV heparin per Rx while pt off eliquis for procedure.   2/27  LD Eliquis 2/27 10am   Baseline aPtt= 33, INR=1.30 and HL=1.90  Goal of Therapy:  APtt= 66-102 sec Heparin level 0.3-0.7 units/ml Monitor platelets by anticoagulation protocol: Yes   Plan:   Baseline coags aPtt/PT/INR/HL stat  Start heparin drip @ 800 units/hr (No bolus)  Will use aPtt to titrate heparin until HL and aPtt correlate and effects of Eliquis on HL are diminished.  Check aPtt in 8 hours and daily CBC/HL.  Lawana Pai R 05/01/2015,11:11 PM

## 2015-05-01 NOTE — ED Provider Notes (Signed)
CSN: [id number]     Arrival date & time 05/01/15  1744 History   First MD Initiated Contact with Patient 05/01/15 1946     Chief Complaint  Patient presents with  . Abdominal Pain  . Constipation     (Consider location/radiation/quality/duration/timing/severity/associated sxs/prior Treatment) HPI   70 year old female with prior history of adenomatous polyp of colon, diverticulosis, afib on Eliquis and prior appendectomy presents c/o abd pain and constipation.  Patient states a year and a half ago she had a colonoscopy and was told that she has a benign polyp. Since having the colonoscopy she has had recurrent abdominal pain with constipations.  She was diagnosed with diverticulosis or diverticulitis, and usually received Bactrim as a treatment. Patient states she has had 8 separate episodes within the past 2 years for these abdominal pain usually improved with taking Bactrim. In December she was started on Tikosyn for her atrial fibrillation and since then she cannot take Bactrim due to a contraindication. She was giving Levaquin by her GI specialist  for her abdominal pain and constipation approximately a week ago when she felt things similar lower abdominal pain. She did take Levaquin for 5 days but noticed no improvement. She continues to endorse intermittent crampy low abdominal pain. She was recommended to use Fleet enema, and also using a bowel prep to help with bowel movement which she has been doing for the past several days. She had an abdominal and pelvis CT scan done earlier today and she was notified to come to the ER for admission because the CT scan showing evidence of a bowel obstruction. At this time patient states that her pain is minimal. She was nauseous earlier but her nausea has since resolved. She endorse occasional chills with these episode but no fever. No complains of chest pain or shortness of breath. She endorse occasional night sweats but no abnormal weight changes and no  prior history of cancer.  Her GI specialist is Dr. Hilarie Fredrickson, who told pt that she will be admitted today.    Past Medical History  Diagnosis Date  . Tobacco abuse   . Allergic rhinitis   . Oral candidiasis   . Atrial flutter ([place])     failed cardioversion 11/2012; s/p ablation 02-02-2013 by Dr Rayann Heman  . Asthma   . Pneumothorax on left 1996    left lower lung collapse s/p resection  . Osteopenia 08/2013     T score -2.3 FRAX 17%/3.0% stable from prior DEXA  . ALLERGIC RHINITIS   . Endometriosis   . Adenomatous polyp of colon 10/2013    f/u colo 10/2018  . Atrial fibrillation with RVR ([place])   . Essential hypertension 05/30/2014  . Diverticulosis   . Chronic bronchitis ([place])     "I'll have it most years" (02/14/2015)  . Arthritis     "neck, back, hands" (02/14/2015)  . ANXIETY     "situational" (02/14/2015)   Past Surgical History  Procedure Laterality Date  . Lung removal, partial Left 02/1965    LLL  . Cesarean section  1976; 1978  . Total abdominal hysterectomy w/ bilateral salpingoophorectomy  1984    Endometriosis  . Cardioversion N/A 11/27/2012    Procedure: CARDIOVERSION;  Surgeon: Thayer Headings, MD;  Location: [facility] ENDOSCOPY;  Service: Cardiovascular;  Laterality: N/A;  . Atrial flutter ablation N/A 02/02/2013    Procedure: ATRIAL FLUTTER ABLATION;  Surgeon: Coralyn Mark, MD;  Location: [facility] CATH LAB;  Service: Cardiovascular;  Laterality: N/A;  . Appendectomy  1978   Family History  Problem Relation Age of Onset  . Stroke Father 63  . Hypertension Father   . Heart disease Father   . Diabetes Mother   . Colon cancer Neg Hx   . Rectal cancer Neg Hx   . Stomach cancer Neg Hx   . Asthma Maternal Grandfather   . Asthma Child    Social History  Substance Use Topics  . Smoking status: Current Every Day Smoker -- 0.25 packs/day for 43 years    Types: Cigarettes  . Smokeless tobacco: Never Used  . Alcohol Use: 18.0 oz/week    14 Standard drinks or equivalent, 16 Glasses  of wine per week     Comment: daily wine   OB History    Gravida Para Term Preterm AB TAB SAB Ectopic Multiple Living   2 2 2       2      Review of Systems  All other systems reviewed and are negative.     Allergies  Tramadol; Amoxicillin-pot clavulanate; Flagyl; Flecainide; Omnicef; and Avelox  Home Medications   Prior to Admission medications   Medication Sig Start Date End Date Taking? Authorizing Provider  acetaminophen (TYLENOL) 650 MG CR tablet Take 1,300 mg by mouth at bedtime. Second dose during day if needed    Historical Provider, MD  diltiazem (CARDIZEM CD) 300 MG 24 hr capsule TAKE 1 CAPSULE BY MOUTH NIGHTLY AT BEDTIME 04/10/15   Sherren Mocha, MD  dofetilide (TIKOSYN) 250 MCG capsule Take 1 capsule (250 mcg total) by mouth 2 (two) times daily. 02/17/15   Amber Sena Slate, NP  ELIQUIS 5 MG TABS tablet TAKE 1 TABLET BY MOUTH 2 TIMES DAILY 11/30/14   Sherren Mocha, MD  estradiol (VIVELLE-DOT) 0.075 MG/24HR PLACE ONE PATCH onto THE SKIN TWICE A WEEK 08/09/14   [name], MD  fluticasone (FLONASE) 50 MCG/ACT nasal spray Place 2 sprays into both nostrils twice daily. 04/24/15   Melvenia Needles, NP  LINZESS 145 MCG CAPS capsule TAKE 1 CAPSULE BY MOUTH EVERY DAY 02/28/15   Amy S Esterwood, PA-C  magnesium oxide (MAG-OX) 400 MG tablet Take 1 tablet (400 mg total) by mouth daily. 02/13/15   Sherren Mocha, MD  Melatonin 3 MG TABS Take 1 tablet by mouth daily as needed. For sleep    Historical Provider, MD  mometasone-formoterol (DULERA) 200-5 MCG/ACT AERO Inhale 2 puffs into the lungs 2 (two) times daily. 03/24/15   Javier Glazier, MD  montelukast (SINGULAIR) 10 MG tablet TAKE 1 TABLET BY MOUTH EVERY DAY 03/07/15   Javier Glazier, MD  MOVIPREP 100 g SOLR Take 1 kit (200 g total) by mouth once. 04/28/15   Jerene Bears, MD  saccharomyces boulardii (FLORASTOR) 250 MG capsule Take 1 capsule (250 mg total) by mouth 2 (two) times daily. 01/24/15   Amy S Esterwood, PA-C   Sennosides-Docusate Sodium (PERI-COLACE PO) Take 1 tablet by mouth daily.    Historical Provider, MD  XOPENEX HFA 45 MCG/ACT inhaler USE ONE TO TWO PUFFS EVERY 4 HOURS AS NEEDED 03/23/15   Javier Glazier, MD   BP 129/66 mmHg  Pulse 96  Temp(Src) 97.7 F (36.5 C) (Oral)  SpO2 98% Physical Exam  Constitutional: She appears well-developed and well-nourished. No distress.  Well-appearing Caucasian female appears to be in no acute distress.  HENT:  Head: Atraumatic.  Eyes: Conjunctivae are normal.  Neck: Neck supple.  Cardiovascular:  Irregularly regular heart rhythm without murmurs rubs or gallops  Pulmonary/Chest: Effort normal and breath sounds normal. No respiratory distress. She has no wheezes.  Abdominal: Soft. Bowel sounds are normal. She exhibits no distension. There is tenderness (Diffuse tenderness most significant to left lower quadrant palpation without guarding or rebound tenderness.).  Genitourinary:  Chaperone present during exam.  Neurological: She is alert.  Skin: No rash noted.  Psychiatric: She has a normal mood and affect.  Nursing note and vitals reviewed.   ED Course  Procedures (including critical care time) Labs Review Labs Reviewed  COMPREHENSIVE METABOLIC PANEL - Abnormal; Notable for the following:    Potassium 3.1 (*)    CO2 21 (*)    Glucose, Bld 103 (*)    GFR calc non Af Amer 60 (*)    All other components within normal limits  CBC - Abnormal; Notable for the following:    WBC 12.2 (*)    HCT 46.3 (*)    All other components within normal limits  URINALYSIS, ROUTINE W REFLEX MICROSCOPIC (NOT AT [facility]) - Abnormal; Notable for the following:    APPearance CLOUDY (*)    Specific Gravity, Urine >1.046 (*)    Ketones, ur 40 (*)    All other components within normal limits  LIPASE, BLOOD    Imaging Review Ct Abdomen Pelvis W Contrast  05/01/2015  CLINICAL DATA:  Severe diffuse abdominal pain and constipation for 2 weeks. EXAM: CT ABDOMEN AND  PELVIS WITH CONTRAST TECHNIQUE: Multidetector CT imaging of the abdomen and pelvis was performed using the standard protocol following bolus administration of intravenous contrast. CONTRAST:  193m OMNIPAQUE IOHEXOL 300 MG/ML  SOLN COMPARISON:  12/10/2013 FINDINGS: Lower chest:  No acute findings. Hepatobiliary: No masses or other significant abnormality. Gallbladder is unremarkable. Pancreas: No mass, inflammatory changes, or other significant abnormality. Spleen: Within normal limits in size and appearance. Adrenals/Urinary Tract: No masses identified. No evidence of hydronephrosis. Duplicated left renal collecting system incidentally noted. Stomach/Bowel: No evidence of small bowel dilatation. Diffuse colonic dilatation is seen with multiple air-fluid levels. Diverticulosis is seen involving the sigmoid colon, without definite acute pericolonic inflammatory changes. There is a relatively short segment of wall thickening seen involving the mid sigmoid colon on image 60/series 2. Although this could be due to mild diverticulitis or muscular hypertrophy, obstructing colon carcinoma cannot definitely be excluded. Vascular/Lymphatic: No pathologically enlarged lymph nodes. No evidence of abdominal aortic aneurysm. Reproductive: Prior hysterectomy noted. Adnexal regions are unremarkable in appearance. A Other: None. Musculoskeletal:  No suspicious bone lesions identified. IMPRESSION: Findings consistent with distal colonic obstruction in the mid sigmoid colon. There is evidence of diverticulosis in this region CT on but there is also a short segment area of concentric wall thickening without acute pericolonic inflammatory changes. Differential diagnosis includes mild diverticulitis, muscular hypertrophy, and an obstructing colon carcinoma cannot definitely be excluded. Recommend clinical correlation, and consider sigmoidoscopy for further evaluation. No evidence of abscess or metastatic disease within the abdomen or  pelvis. Electronically Signed   By: JEarle GellM.D.   On: 05/01/2015 16:40   I have personally reviewed and evaluated these images and lab results as part of my medical decision-making.   EKG Interpretation None      MDM   Final diagnoses:  Colonic obstruction (HCC)  Lower abdominal pain    BP 108/77 mmHg  Pulse 83  Temp(Src) 97.7 F (36.5 C) (Oral)  Resp 20  SpO2 99%   8:15 PM Patient presents with low abdominal pain and constipation. An abdominal pelvic CT scan performed  today showed evidence consistence with a distal colonic obstruction at the mid sigmoid region. She was told by her GI specialist, Dr. Hilarie Fredrickson, to come to the ER to be admitted.  I will consult Dr. Hilarie Fredrickson and will also request medicine for admission.    9:16 PM Appreciate consultation from [facility] Dr. Hal Hope who agrees to admit pt to med surg bed under his care.  Pt made aware of plan and agrees.  IVF given.  NPO status.  Mild hypokalemia, supplementation given via IV.  Care discussed with Dr. Billy Fischer.    Domenic Moras, PA-C 05/01/15 2119  Gareth Morgan, MD 05/02/15 [phone]

## 2015-05-01 NOTE — H&P (Signed)
[facility] History and Physical  Elizabeth Mcconnell CZY:[id number] DOB: [birth year] DOA: 05/01/2015  Referring physician: Mr.Bowie. PCP: [name], MD  Specialists: Dr.Pyrtle. Gastroenterologist.  Chief Complaint: Abdominal pain.  HPI: Elizabeth Mcconnell is a 70 y.o. female with history of persistent atrial fibrillation, atrial flutter status post ablation, asthma was referred to the ER after patient's CAT scan showed colonic obstruction. Patient has been experiencing abdominal pain off and on for last 1 year. Patient has been taking antibiotics off and on. Last 2 weeks patient's abdominal pain has worsened. Patient did take a course of antibiotics for sinusitis recently. Denies any vomiting but does have some nausea denies any diarrhea. Patient did have a bowel movement yesterday. Patient had recently followed up with her gastroenterologist 4 days ago and had a CAT scan done which shows distal colonic obstruction. Patient was referred to the ER. Patient still has some pain around the left quadrant. Abdomen on exam is nondistended and not tender.  Review of Systems: As presented in the history of presenting illness, rest negative.  Past Medical History  Diagnosis Date  . Tobacco abuse   . Allergic rhinitis   . Oral candidiasis   . Atrial flutter ([place])     failed cardioversion 11/2012; s/p ablation 02-02-2013 by Dr Rayann Heman  . Asthma   . Pneumothorax on left 1996    left lower lung collapse s/p resection  . Osteopenia 08/2013     T score -2.3 FRAX 17%/3.0% stable from prior DEXA  . ALLERGIC RHINITIS   . Endometriosis   . Adenomatous polyp of colon 10/2013    f/u colo 10/2018  . Atrial fibrillation with RVR ([place])   . Essential hypertension 05/30/2014  . Diverticulosis   . Chronic bronchitis ([place])     "I'll have it most years" (02/14/2015)  . Arthritis     "neck, back, hands" (02/14/2015)  . ANXIETY     "situational" (02/14/2015)   Past Surgical History  Procedure  Laterality Date  . Lung removal, partial Left 02/1965    LLL  . Cesarean section  1976; 1978  . Total abdominal hysterectomy w/ bilateral salpingoophorectomy  1984    Endometriosis  . Cardioversion N/A 11/27/2012    Procedure: CARDIOVERSION;  Surgeon: Thayer Headings, MD;  Location[facility] ENDOSCOPY;  Service: Cardiovascular;  Laterality: N/A;  . Atrial flutter ablation N/A 02/02/2013    Procedure: ATRIAL FLUTTER ABLATION;  Surgeon: Coralyn Mark, MD;  Location: [facility] CATH LAB;  Service: Cardiovascular;  Laterality: N/A;  . Appendectomy  1978   Social History:  reports that she has been smoking Cigarettes.  She has a 10.75 pack-year smoking history. She has never used smokeless tobacco. She reports that she drinks about 18.0 oz of alcohol per week. She reports that she does not use illicit drugs. Where does patient live home. Can patient participate in ADLs? Yes.  Allergies  Allergen Reactions  . Tramadol Other (See Comments)    Severe dizziness, weakness  . Amoxicillin-Pot Clavulanate Nausea And Vomiting  . Flagyl [Metronidazole] Nausea And Vomiting  . Flecainide     dizziness  . Omnicef [Cefdinir] Nausea And Vomiting  . Avelox [Moxifloxacin] Hives, Itching and Rash    Family History:  Family History  Problem Relation Age of Onset  . Stroke Father 35  . Hypertension Father   . Heart disease Father   . Diabetes Mother   . Colon cancer Neg Hx   . Rectal cancer Neg Hx   . Stomach cancer  Neg Hx   . Asthma Maternal Grandfather   . Asthma Child       Prior to Admission medications   Medication Sig Start Date End Date Taking? Authorizing Provider  acetaminophen (TYLENOL) 650 MG CR tablet Take 1,300 mg by mouth at bedtime. Second dose during day if needed   Yes Historical Provider, MD  diltiazem (CARDIZEM CD) 300 MG 24 hr capsule TAKE 1 CAPSULE BY MOUTH NIGHTLY AT BEDTIME 04/10/15  Yes Sherren Mocha, MD  dofetilide (TIKOSYN) 250 MCG capsule Take 1 capsule (250 mcg total) by mouth 2  (two) times daily. 02/17/15  Yes Amber Sena Slate, NP  ELIQUIS 5 MG TABS tablet TAKE 1 TABLET BY MOUTH 2 TIMES DAILY 11/30/14  Yes Sherren Mocha, MD  estradiol (VIVELLE-DOT) 0.05 MG/24HR patch Place 1 patch onto the skin 2 (two) times a week. 04/24/15  Yes Historical Provider, MD  fluticasone (FLONASE) 50 MCG/ACT nasal spray Place 2 sprays into both nostrils twice daily. 04/24/15  Yes Tammy S Parrett, NP  LINZESS 145 MCG CAPS capsule TAKE 1 CAPSULE BY MOUTH EVERY DAY 02/28/15  Yes Amy S Esterwood, PA-C  magnesium oxide (MAG-OX) 400 MG tablet Take 1 tablet (400 mg total) by mouth daily. 02/13/15  Yes Sherren Mocha, MD  Melatonin 3 MG TABS Take 1 tablet by mouth daily as needed. For sleep   Yes Historical Provider, MD  mometasone-formoterol (DULERA) 200-5 MCG/ACT AERO Inhale 2 puffs into the lungs 2 (two) times daily. 03/24/15  Yes Javier Glazier, MD  montelukast (SINGULAIR) 10 MG tablet TAKE 1 TABLET BY MOUTH EVERY DAY 03/07/15  Yes Javier Glazier, MD  polyethylene glycol [facility] / GLYCOLAX) packet Take 17 g by mouth daily.   Yes Historical Provider, MD  Sennosides-Docusate Sodium (PERI-COLACE PO) Take 1 tablet by mouth daily.   Yes Historical Provider, MD  sodium phosphate Pediatric (FLEET) 3.5-9.5 GM/59ML enema Place 1 enema rectally once as needed for severe constipation.   Yes Historical Provider, MD  estradiol (VIVELLE-DOT) 0.075 MG/24HR PLACE ONE PATCH onto THE SKIN TWICE A WEEK 08/09/14   [name], MD  MOVIPREP 100 g SOLR Take 1 kit (200 g total) by mouth once. 04/28/15   Jerene Bears, MD  saccharomyces boulardii (FLORASTOR) 250 MG capsule Take 1 capsule (250 mg total) by mouth 2 (two) times daily. 01/24/15   Amy S Esterwood, PA-C  XOPENEX HFA 45 MCG/ACT inhaler USE ONE TO TWO PUFFS EVERY 4 HOURS AS NEEDED Patient taking differently: USE ONE TO TWO PUFFS EVERY 4 HOURS AS NEEDED SOB 03/23/15   Javier Glazier, MD    Physical Exam: Filed Vitals:   05/01/15 1805 05/01/15 2037  05/01/15 2154  BP: 129/66 108/77 124/93  Pulse: 96 83 127  Temp: 97.7 F (36.5 C) 97.7 F (36.5 C) 98.1 F (36.7 C)  TempSrc: Oral Oral Oral  Resp:  20 20  SpO2: 98% 99% 99%     General:  Moderately built and nourished.  Eyes: Anicteric no pallor.  ENT: No discharge from the ears eyes nose or mouth.  Neck: No mass felt.  Cardiovascular: S1-S2 heard.  Respiratory: No rhonchi or crepitations.  Abdomen: Soft nontender bowel sounds present. No guarding or rigidity.  Skin: No rash.  Musculoskeletal: No edema.  Psychiatric: Appears normal.  Neurologic: Alert awake oriented to time place and person. Moves all extremities.  Labs on Admission:  Basic Metabolic Panel:  Recent Labs Lab 04/28/15 1641 05/01/15 1915  NA 137 136  K 3.8  3.1*  CL 103 101  CO2 27 21*  GLUCOSE 89 103*  BUN 11 10  CREATININE 0.74 0.94  CALCIUM 9.3 9.1   Liver Function Tests:  Recent Labs Lab 04/28/15 1641 05/01/15 1915  AST 13 33  ALT 9 27  ALKPHOS 86 76  BILITOT 0.5 1.0  PROT 7.4 7.3  ALBUMIN 4.3 4.4    Recent Labs Lab 05/01/15 1915  LIPASE 34   No results for input(s): AMMONIA in the last 168 hours. CBC:  Recent Labs Lab 04/28/15 1641 05/01/15 1915  WBC 9.1 12.2*  NEUTROABS 6.2  --   HGB 13.5 15.0  HCT 40.2 46.3*  MCV 87.7 91.5  PLT 265.0 290   Cardiac Enzymes: No results for input(s): CKTOTAL, CKMB, CKMBINDEX, TROPONINI in the last 168 hours.  BNP (last 3 results) No results for input(s): BNP in the last 8760 hours.  ProBNP (last 3 results) No results for input(s): PROBNP in the last 8760 hours.  CBG: No results for input(s): GLUCAP in the last 168 hours.  Radiological Exams on Admission: Ct Abdomen Pelvis W Contrast  05/01/2015  CLINICAL DATA:  Severe diffuse abdominal pain and constipation for 2 weeks. EXAM: CT ABDOMEN AND PELVIS WITH CONTRAST TECHNIQUE: Multidetector CT imaging of the abdomen and pelvis was performed using the standard protocol  following bolus administration of intravenous contrast. CONTRAST:  152m OMNIPAQUE IOHEXOL 300 MG/ML  SOLN COMPARISON:  12/10/2013 FINDINGS: Lower chest:  No acute findings. Hepatobiliary: No masses or other significant abnormality. Gallbladder is unremarkable. Pancreas: No mass, inflammatory changes, or other significant abnormality. Spleen: Within normal limits in size and appearance. Adrenals/Urinary Tract: No masses identified. No evidence of hydronephrosis. Duplicated left renal collecting system incidentally noted. Stomach/Bowel: No evidence of small bowel dilatation. Diffuse colonic dilatation is seen with multiple air-fluid levels. Diverticulosis is seen involving the sigmoid colon, without definite acute pericolonic inflammatory changes. There is a relatively short segment of wall thickening seen involving the mid sigmoid colon on image 60/series 2. Although this could be due to mild diverticulitis or muscular hypertrophy, obstructing colon carcinoma cannot definitely be excluded. Vascular/Lymphatic: No pathologically enlarged lymph nodes. No evidence of abdominal aortic aneurysm. Reproductive: Prior hysterectomy noted. Adnexal regions are unremarkable in appearance. A Other: None. Musculoskeletal:  No suspicious bone lesions identified. IMPRESSION: Findings consistent with distal colonic obstruction in the mid sigmoid colon. There is evidence of diverticulosis in this region CT on but there is also a short segment area of concentric wall thickening without acute pericolonic inflammatory changes. Differential diagnosis includes mild diverticulitis, muscular hypertrophy, and an obstructing colon carcinoma cannot definitely be excluded. Recommend clinical correlation, and consider sigmoidoscopy for further evaluation. No evidence of abscess or metastatic disease within the abdomen or pelvis. Electronically Signed   By: JEarle GellM.D.   On: 05/01/2015 16:40     Assessment/Plan Principal Problem:    Colonic obstruction (HCC) Active Problems:   Asthma, moderate persistent   Atrial fibrillation with RVR (HCC)   1. Colonic obstruction - CT scan shows distal colonic obstruction with differentials including muscular hypertrophy, obstructing mass or diverticulitis. I have placed patient on clear liquid diet and discussed with on-call surgeon Dr. NLucia Gaskinswho will be seeing patient in consult. Please consultations gastroenterologist Dr. PHilarie Fredricksonin a.m. For now I have placed patient on IV Zosyn (patient is allergic to Flagyl). Pain relief medications. Note that patient is on Apixaban see #2. 2. Atrial fibrillation with RVR - patient's heart rate on the monitor is in the  130s. Patient is due for her evening dose of Cardizem and dofetilide. I have ordered 1 dose of each now. If her heart rate does not get controlled with these may need Cardizem infusion. Patient's chads 2 vasc score is more than 2. Patient is on Apixaban which at this time I'm holding off in anticipation of procedure and will place patient on heparin infusion per pharmacy. Patient's last dose of Apixaban was this morning. 3. History of asthma - presently not wheezing. Continue inhalers.   DVT Prophylaxis heparin infusion.  Code Status: Full code.  Family Communication: Patient's husband at the bedside.  Disposition Plan: Admit to inpatient.    Karem Tomaso N. [facility] Pager [phone].  If 7PM-7AM, please contact night-coverage [email or web address] Password [facility] 05/01/2015, 10:45 PM

## 2015-05-01 NOTE — Telephone Encounter (Signed)
Dixon Boos CMA already spoke with pt.

## 2015-05-01 NOTE — Telephone Encounter (Signed)
I have advised patient of Dr Vena Rua recommendations to first use fleets enema, then try oral prep again as tolerated. Advised to go forward with CT if at all possible and if she has severe nausea/vomiting with any of this, she can go to Emergency room for possible admission. She verbalizes understanding.

## 2015-05-02 ENCOUNTER — Encounter (HOSPITAL_COMMUNITY): Payer: Self-pay | Admitting: Cardiology

## 2015-05-02 DIAGNOSIS — R935 Abnormal findings on diagnostic imaging of other abdominal regions, including retroperitoneum: Secondary | ICD-10-CM

## 2015-05-02 DIAGNOSIS — Z0000 Encounter for general adult medical examination without abnormal findings: Secondary | ICD-10-CM | POA: Insufficient documentation

## 2015-05-02 DIAGNOSIS — K5660 Unspecified intestinal obstruction: Secondary | ICD-10-CM

## 2015-05-02 DIAGNOSIS — K5669 Other intestinal obstruction: Principal | ICD-10-CM

## 2015-05-02 DIAGNOSIS — J4541 Moderate persistent asthma with (acute) exacerbation: Secondary | ICD-10-CM

## 2015-05-02 DIAGNOSIS — K56699 Other intestinal obstruction unspecified as to partial versus complete obstruction: Secondary | ICD-10-CM

## 2015-05-02 DIAGNOSIS — Z01810 Encounter for preprocedural cardiovascular examination: Secondary | ICD-10-CM

## 2015-05-02 DIAGNOSIS — I4891 Unspecified atrial fibrillation: Secondary | ICD-10-CM

## 2015-05-02 LAB — CBC WITH DIFFERENTIAL/PLATELET
Basophils Absolute: 0 10*3/uL (ref 0.0–0.1)
Basophils Relative: 0 %
Eosinophils Absolute: 0 10*3/uL (ref 0.0–0.7)
Eosinophils Relative: 0 %
HCT: 35.1 % — ABNORMAL LOW (ref 36.0–46.0)
Hemoglobin: 11.6 g/dL — ABNORMAL LOW (ref 12.0–15.0)
Lymphocytes Relative: 31 %
Lymphs Abs: 2.9 10*3/uL (ref 0.7–4.0)
MCH: 29.9 pg (ref 26.0–34.0)
MCHC: 33 g/dL (ref 30.0–36.0)
MCV: 90.5 fL (ref 78.0–100.0)
Monocytes Absolute: 0.6 10*3/uL (ref 0.1–1.0)
Monocytes Relative: 6 %
Neutro Abs: 6 10*3/uL (ref 1.7–7.7)
Neutrophils Relative %: 63 %
Platelets: 216 10*3/uL (ref 150–400)
RBC: 3.88 MIL/uL (ref 3.87–5.11)
RDW: 13.3 % (ref 11.5–15.5)
WBC: 9.6 10*3/uL (ref 4.0–10.5)

## 2015-05-02 LAB — COMPREHENSIVE METABOLIC PANEL
ALT: 20 U/L (ref 14–54)
AST: 23 U/L (ref 15–41)
Albumin: 3.4 g/dL — ABNORMAL LOW (ref 3.5–5.0)
Alkaline Phosphatase: 54 U/L (ref 38–126)
Anion gap: 11 (ref 5–15)
BUN: 9 mg/dL (ref 6–20)
CO2: 20 mmol/L — ABNORMAL LOW (ref 22–32)
Calcium: 8.4 mg/dL — ABNORMAL LOW (ref 8.9–10.3)
Chloride: 110 mmol/L (ref 101–111)
Creatinine, Ser: 0.69 mg/dL (ref 0.44–1.00)
GFR calc Af Amer: >60 mL/min (ref >60)
GFR calc non Af Amer: >60 mL/min (ref >60)
Glucose, Bld: 78 mg/dL (ref 65–99)
Potassium: 3.3 mmol/L — ABNORMAL LOW (ref 3.5–5.1)
Sodium: 141 mmol/L (ref 135–145)
Total Bilirubin: 1.1 mg/dL (ref 0.3–1.2)
Total Protein: 5.6 g/dL — ABNORMAL LOW (ref 6.5–8.1)

## 2015-05-02 LAB — GLUCOSE, CAPILLARY
Glucose-Capillary: 94 mg/dL (ref 65–99)
Glucose-Capillary: 64 mg/dL — ABNORMAL LOW (ref 65–99)
Glucose-Capillary: 90 mg/dL (ref 65–99)
Glucose-Capillary: 81 mg/dL (ref 65–99)
Glucose-Capillary: 89 mg/dL (ref 65–99)
Glucose-Capillary: 92 mg/dL (ref 65–99)

## 2015-05-02 LAB — APTT
aPTT: 33 seconds (ref 24–37)
aPTT: 52 seconds — ABNORMAL HIGH (ref 24–37)
aPTT: 58 seconds — ABNORMAL HIGH (ref 24–37)

## 2015-05-02 LAB — HEPARIN LEVEL (UNFRACTIONATED)
Heparin Unfractionated: 1.9 IU/mL — ABNORMAL HIGH (ref 0.30–0.70)
Heparin Unfractionated: 1.3 IU/mL — ABNORMAL HIGH (ref 0.30–0.70)
Heparin Unfractionated: 0.97 IU/mL — ABNORMAL HIGH (ref 0.30–0.70)

## 2015-05-02 LAB — PROTIME-INR
INR: 1.3 (ref 0.00–1.49)
Prothrombin Time: 15.8 seconds — ABNORMAL HIGH (ref 11.6–15.2)

## 2015-05-02 MED ORDER — HEPARIN (PORCINE) IN NACL 100-0.45 UNIT/ML-% IJ SOLN
900.0000 [IU]/h | INTRAMUSCULAR | Status: DC
  Administered 2015-05-02: 900 [IU]/h via INTRAVENOUS
  Filled 2015-05-02: qty 250

## 2015-05-02 MED ORDER — POLYETHYLENE GLYCOL 3350 17 G PO PACK
17.0000 g | PACK | Freq: Two times a day (BID) | ORAL | Status: DC
  Administered 2015-05-02 – 2015-05-04 (×5): 17 g via ORAL
  Filled 2015-05-02 (×7): qty 1

## 2015-05-02 MED ORDER — PIPERACILLIN-TAZOBACTAM 3.375 G IVPB
3.3750 g | Freq: Three times a day (TID) | INTRAVENOUS | Status: DC
  Administered 2015-05-02 – 2015-05-04 (×7): 3.375 g via INTRAVENOUS
  Filled 2015-05-02 (×10): qty 50

## 2015-05-02 MED ORDER — CHLORHEXIDINE GLUCONATE 4 % EX LIQD
60.0000 mL | Freq: Once | CUTANEOUS | Status: AC
  Administered 2015-05-03: 4 via TOPICAL
  Filled 2015-05-02: qty 60

## 2015-05-02 MED ORDER — FLEET ENEMA 7-19 GM/118ML RE ENEM
1.0000 | ENEMA | Freq: Once | RECTAL | Status: DC
  Filled 2015-05-02: qty 1

## 2015-05-02 MED ORDER — ALVIMOPAN 12 MG PO CAPS: 12.0000 mg | ORAL_CAPSULE | Freq: Once | ORAL | Status: DC

## 2015-05-02 MED ORDER — NEOMYCIN SULFATE 500 MG PO TABS
1000.0000 mg | ORAL_TABLET | ORAL | Status: AC
  Administered 2015-05-03 (×3): 1000 mg via ORAL
  Filled 2015-05-02 (×3): qty 2

## 2015-05-02 MED ORDER — HEPARIN (PORCINE) IN NACL 100-0.45 UNIT/ML-% IJ SOLN
1050.0000 [IU]/h | INTRAMUSCULAR | Status: AC
  Administered 2015-05-02 – 2015-05-03 (×3): 1050 [IU]/h via INTRAVENOUS
  Filled 2015-05-02 (×2): qty 250

## 2015-05-02 MED ORDER — BOOST / RESOURCE BREEZE PO LIQD
1.0000 | Freq: Two times a day (BID) | ORAL | Status: DC
  Administered 2015-05-02 – 2015-05-08 (×7): 1 via ORAL

## 2015-05-02 MED ORDER — CEFOTETAN DISODIUM-DEXTROSE 2-2.08 GM-% IV SOLR: 2.0000 g | INTRAVENOUS | Status: DC

## 2015-05-02 MED ORDER — NEOMYCIN SULFATE 500 MG PO TABS
1000.0000 mg | ORAL_TABLET | ORAL | Status: DC
  Filled 2015-05-02 (×3): qty 2

## 2015-05-02 MED ORDER — ENOXAPARIN SODIUM 40 MG/0.4ML ~~LOC~~ SOLN: 40.0000 mg | Freq: Once | SUBCUTANEOUS | Status: DC

## 2015-05-02 MED ORDER — CHLORHEXIDINE GLUCONATE 4 % EX LIQD
60.0000 mL | Freq: Once | CUTANEOUS | Status: AC
  Administered 2015-05-04: 4 via TOPICAL
  Filled 2015-05-02 (×2): qty 60

## 2015-05-02 MED ORDER — POTASSIUM CHLORIDE CRYS ER 20 MEQ PO TBCR
40.0000 meq | EXTENDED_RELEASE_TABLET | Freq: Once | ORAL | Status: AC
  Administered 2015-05-02: 40 meq via ORAL
  Filled 2015-05-02: qty 2

## 2015-05-02 NOTE — Progress Notes (Signed)
[place] for IV Heparin Indication: atrial fibrillation (PTA Eliquis held)  Allergies  Allergen Reactions  . Tramadol Other (See Comments)    Severe dizziness, weakness  . Amoxicillin-Pot Clavulanate Nausea And Vomiting  . Flagyl [Metronidazole] Nausea And Vomiting  . Flecainide     dizziness  . Omnicef [Cefdinir] Nausea And Vomiting  . Avelox [Moxifloxacin] Hives, Itching and Rash    Patient Measurements: Height: 5\' 5"  (165.1 cm) Weight: 142 lb 14.4 oz (64.819 kg) IBW/kg (Calculated) : 57 Heparin Dosing Weight: 57 kg  Vital Signs: Temp: 97.9 F (36.6 C) (02/28 0109) Temp Source: Oral (02/28 0109) BP: 124/59 mmHg (02/28 0109) Pulse Rate: 82 (02/28 0109)  Labs:  Recent Labs  05/01/15 1915 05/01/15 2358 05/02/15 0436  HGB 15.0  --  11.6*  HCT 46.3*  --  35.1*  PLT 290  --  216  APTT  --  33  --   LABPROT  --  15.8*  --   INR  --  1.30  --   HEPARINUNFRC  --  1.90*  --   CREATININE 0.94  --  0.69    Estimated Creatinine Clearance: 58 mL/min (by C-G formula based on Cr of 0.69).   Infusions:  . sodium chloride 50 mL/hr at 05/01/15 2305  . heparin 800 Units/hr (05/01/15 2356)    Assessment: 79 yoF presented to ED on 2/27 with c/o abdominal pain and constipation; outpatient CT scan showed colonic obstruction.  PMH includes Eliquis anticoagulation for A-fib which is now held in anticipation of procedure; GI and surgery are consulted.  Pharmacy is consulted to dose IV heparin.   Last reported home dose of Eliquis 5mg  was 2/27 10am   Today, 05/02/2015:  APTT 58, below goal.  HL 1.3 (supratherapeutic, but expected to remain elevated d/t Eliquis interaction)  CBC:  Hgb decreased to 11.6, Plt remain WNL  No bleeding or complications reported.   Goal of Therapy:  APtt= 66-102 sec Heparin level 0.3-0.7 units/ml Monitor platelets by anticoagulation protocol: Yes   Plan:   Increase to heparin IV infusion at 900  units/hr  Will use aPtt to titrate heparin until HL and aPtt correlate and effects of Eliquis on HL are diminished.  APTT, Heparin level 8 hours after rate change  Daily heparin level, APTT, and CBC  Follow up plans for procedures   Gretta Arab PharmD, BCPS Pager [phone] 05/02/2015 7:24 AM

## 2015-05-02 NOTE — Consult Note (Signed)
[facility] Surgery Consult Note  Elizabeth Mcconnell [facility] May 31, 1945  [id number].    Requesting MD: Dr. Hal Hope Chief Complaint/Reason for Consult: Colonic obstruction  HPI:  70 y/o white female with PMH persistent AFIB (on Eliquis), atrial flutter s/p ablation, asthma, tobacco abuse was referred to the [facility] after outpatient CT scan showed colonic obstruction. Patient has been experiencing constipation and abdominal pain off and on for last 1.5 years.   Patient has been taking antibiotics off and on for diverticular issues, first Bactrim then Doxycycline because of an interaction with her Bactrim and Tikosyn.  She is allergic to Augmentin and can not tolerate flagyl due to GI symptoms.  She has had 8 notable episodes since late August 2016 after her colonoscopy.  Over the last 2 weeks her abdominal pain/cramping/constipation has worsened.   She saw Dr. Hilarie Fredrickson last week and he ordered Moviprep to prep before scheduled outpatient CT scan. She vomited most of the prep on Friday night, but no BM. Was advised to use enema but she did not take it because she started having liquid stools.  Was able to tolerate some food over the weekend and proceeded with CT scan 2/27. Current she is tolerating liquids, last BM was this am, also having flatus.  No longer having N/V.  Admits to chills, fatigue, but no fever, CP/SOB.  Pain in LLQ.  No radicular pain.  Notes its precipitated by some foods, but can't remember which ones.  Denies alleviating factors.  Patient was referred to [facility] when CT scan showed distal colonic obstruction in the mid-sigmoid colon.  Last colonoscopy 10/2013 by Dr. Olevia Perches, which showed severe sigmoid diverticulosis and one polyp that was removed and was tubular adenoma on pathology.  H/o prior appendectomy (with 2nd c-section), C-section x2 (1976, 1978), and TAH with BSO (1984).   Also h/o partial lung excision for spontaneous pneumothorax (LLL in 1966).   ROS: All systems reviewed  and otherwise negative except for as above  Family History  Problem Relation Age of Onset  . Stroke Father 18  . Hypertension Father   . Heart disease Father   . Diabetes Mother   . Colon cancer Neg Hx   . Rectal cancer Neg Hx   . Stomach cancer Neg Hx   . Asthma Maternal Grandfather   . Asthma Child     Past Medical History  Diagnosis Date  . Tobacco abuse   . Allergic rhinitis   . Oral candidiasis   . Atrial flutter ([place])     failed cardioversion 11/2012; s/p ablation 02-02-2013 by Dr Rayann Heman  . Asthma   . Pneumothorax on left 1996    left lower lung collapse s/p resection  . Osteopenia 08/2013     T score -2.3 FRAX 17%/3.0% stable from prior DEXA  . ALLERGIC RHINITIS   . Endometriosis   . Adenomatous polyp of colon 10/2013    f/u colo 10/2018  . Atrial fibrillation with RVR ([place])   . Essential hypertension 05/30/2014  . Diverticulosis   . Chronic bronchitis ([place])     "I'll have it most years" (02/14/2015)  . Arthritis     "neck, back, hands" (02/14/2015)  . ANXIETY     "situational" (02/14/2015)    Past Surgical History  Procedure Laterality Date  . Lung removal, partial Left 02/1965    LLL  . Cesarean section  1976; 1978  . Total abdominal hysterectomy w/ bilateral salpingoophorectomy  1984    Endometriosis  . Cardioversion N/A 11/27/2012  Procedure: CARDIOVERSION;  Surgeon: Thayer Headings, MD;  Location: [facility] ENDOSCOPY;  Service: Cardiovascular;  Laterality: N/A;  . Atrial flutter ablation N/A 02/02/2013    Procedure: ATRIAL FLUTTER ABLATION;  Surgeon: Coralyn Mark, MD;  Location: [facility] CATH LAB;  Service: Cardiovascular;  Laterality: N/A;  . Appendectomy  1978    Social History:  reports that she has been smoking Cigarettes.  She has a 10.75 pack-year smoking history. She has never used smokeless tobacco. She reports that she drinks about 18.0 oz of alcohol per week. She reports that she does not use illicit drugs.  Smokes 2 cigarettes per day currently due to  stress.  Allergies:  Allergies  Allergen Reactions  . Tramadol Other (See Comments)    Severe dizziness, weakness  . Amoxicillin-Pot Clavulanate Nausea And Vomiting  . Flagyl [Metronidazole] Nausea And Vomiting  . Flecainide     dizziness  . Omnicef [Cefdinir] Nausea And Vomiting  . Avelox [Moxifloxacin] Hives, Itching and Rash    Medications Prior to Admission  Medication Sig Dispense Refill  . acetaminophen (TYLENOL) 650 MG CR tablet Take 1,300 mg by mouth at bedtime. Second dose during day if needed    . diltiazem (CARDIZEM CD) 300 MG 24 hr capsule TAKE 1 CAPSULE BY MOUTH NIGHTLY AT BEDTIME 30 capsule 11  . dofetilide (TIKOSYN) 250 MCG capsule Take 1 capsule (250 mcg total) by mouth 2 (two) times daily. 14 capsule 0  . ELIQUIS 5 MG TABS tablet TAKE 1 TABLET BY MOUTH 2 TIMES DAILY 60 tablet 5  . estradiol (VIVELLE-DOT) 0.05 MG/24HR patch Place 1 patch onto the skin 2 (two) times a week.  12  . fluticasone (FLONASE) 50 MCG/ACT nasal spray Place 2 sprays into both nostrils twice daily. 16 g 2  . LINZESS 145 MCG CAPS capsule TAKE 1 CAPSULE BY MOUTH EVERY DAY 30 capsule 5  . magnesium oxide (MAG-OX) 400 MG tablet Take 1 tablet (400 mg total) by mouth daily. 30 tablet 6  . Melatonin 3 MG TABS Take 1 tablet by mouth daily as needed. For sleep    . mometasone-formoterol (DULERA) 200-5 MCG/ACT AERO Inhale 2 puffs into the lungs 2 (two) times daily. 1 Inhaler 0  . montelukast (SINGULAIR) 10 MG tablet TAKE 1 TABLET BY MOUTH EVERY DAY 30 tablet 2  . polyethylene glycol (MIRALAX / GLYCOLAX) packet Take 17 g by mouth daily.    Orlie Dakin Sodium (PERI-COLACE PO) Take 1 tablet by mouth daily.    . sodium phosphate Pediatric (FLEET) 3.5-9.5 GM/59ML enema Place 1 enema rectally once as needed for severe constipation.    [name] estradiol (VIVELLE-DOT) 0.075 MG/24HR PLACE ONE PATCH onto THE SKIN TWICE A WEEK 4 patch 0  . MOVIPREP 100 g SOLR Take 1 kit (200 g total) by mouth once. 1 kit 0  .  saccharomyces boulardii (FLORASTOR) 250 MG capsule Take 1 capsule (250 mg total) by mouth 2 (two) times daily. 60 capsule 1  . XOPENEX HFA 45 MCG/ACT inhaler USE ONE TO TWO PUFFS EVERY 4 HOURS AS NEEDED (Patient taking differently: USE ONE TO TWO PUFFS EVERY 4 HOURS AS NEEDED SOB) 15 g 3    Blood pressure 124/59, pulse 82, temperature 97.9 F (36.6 C), temperature source Oral, resp. rate 18, height _0  (1.651 m), weight 64.819 kg (142 lb 14.4 oz), SpO2 99 %. Physical Exam: General: pleasant, WD/WN white female who is laying in bed in NAD HEENT: head is normocephalic, atraumatic.  Sclera are noninjected.  PERRL.  Ears and nose without any masses or lesions.  Mouth is pink and moist Heart: regular, rate, and rhythm.  No obvious murmurs, gallops, or rubs noted.  Palpable pedal pulses bilaterally Lungs: CTAB, no wheezes, rhonchi, or rales noted.  Respiratory effort nonlabored Abd: soft, ND, minimally tender in LLQ, +BS, no masses, hernias, or organomegaly MS: all 4 extremities are symmetrical with no cyanosis, clubbing, or edema Skin: warm and dry with no masses, lesions, or rashes Psych: A&Ox3 with an appropriate affect.   Results for orders placed or performed during the hospital encounter of 05/01/15 (from the past 48 hour(s))  Lipase, blood     Status: None   Collection Time: 05/01/15  7:15 PM  Result Value Ref Range   Lipase 34 11 - 51 U/L  Comprehensive metabolic panel     Status: Abnormal   Collection Time: 05/01/15  7:15 PM  Result Value Ref Range   Sodium 136 135 - 145 mmol/L   Potassium 3.1 (L) 3.5 - 5.1 mmol/L   Chloride 101 101 - 111 mmol/L   CO2 21 (L) 22 - 32 mmol/L   Glucose, Bld 103 (H) 65 - 99 mg/dL   BUN 10 6 - 20 mg/dL   Creatinine, Ser 0.94 0.44 - 1.00 mg/dL   Calcium 9.1 8.9 - 10.3 mg/dL   Total Protein 7.3 6.5 - 8.1 g/dL   Albumin 4.4 3.5 - 5.0 g/dL   AST 33 15 - 41 U/L   ALT 27 14 - 54 U/L   Alkaline Phosphatase 76 38 - 126 U/L   Total Bilirubin 1.0 0.3 -  1.2 mg/dL   GFR calc non Af Amer 60 (L) >60 mL/min   GFR calc Af Amer >60 >60 mL/min    Comment: (NOTE) The eGFR has been calculated using the CKD EPI equation. This calculation has not been validated in all clinical situations. eGFR's persistently <60 mL/min signify possible Chronic Kidney Disease.    Anion gap 14 5 - 15  CBC     Status: Abnormal   Collection Time: 05/01/15  7:15 PM  Result Value Ref Range   WBC 12.2 (H) 4.0 - 10.5 K/uL   RBC 5.06 3.87 - 5.11 MIL/uL   Hemoglobin 15.0 12.0 - 15.0 g/dL   HCT 46.3 (H) 36.0 - 46.0 %   MCV 91.5 78.0 - 100.0 fL   MCH 29.6 26.0 - 34.0 pg   MCHC 32.4 30.0 - 36.0 g/dL   RDW 13.5 11.5 - 15.5 %   Platelets 290 150 - 400 K/uL  Urinalysis, Routine w reflex microscopic (not at [facility])     Status: Abnormal   Collection Time: 05/01/15  8:01 PM  Result Value Ref Range   Color, Urine YELLOW YELLOW   APPearance CLOUDY (A) CLEAR   Specific Gravity, Urine >1.046 (H) 1.005 - 1.030   pH 5.0 5.0 - 8.0   Glucose, UA NEGATIVE NEGATIVE mg/dL   Hgb urine dipstick NEGATIVE NEGATIVE   Bilirubin Urine NEGATIVE NEGATIVE   Ketones, ur 40 (A) NEGATIVE mg/dL   Protein, ur NEGATIVE NEGATIVE mg/dL   Nitrite NEGATIVE NEGATIVE   Leukocytes, UA NEGATIVE NEGATIVE    Comment: MICROSCOPIC NOT DONE ON URINES WITH NEGATIVE PROTEIN, BLOOD, LEUKOCYTES, NITRITE, OR GLUCOSE <1000 mg/dL.  APTT     Status: None   Collection Time: 05/01/15 11:58 PM  Result Value Ref Range   aPTT 33 24 - 37 seconds  Heparin level (unfractionated)     Status: Abnormal   Collection Time:  05/01/15 11:58 PM  Result Value Ref Range   Heparin Unfractionated 1.90 (H) 0.30 - 0.70 IU/mL    Comment: RESULTS CONFIRMED BY MANUAL DILUTION        IF HEPARIN RESULTS ARE BELOW EXPECTED VALUES, AND PATIENT DOSAGE HAS BEEN CONFIRMED, SUGGEST FOLLOW UP TESTING OF ANTITHROMBIN III LEVELS.   Protime-INR     Status: Abnormal   Collection Time: 05/01/15 11:58 PM  Result Value Ref Range   Prothrombin  Time 15.8 (H) 11.6 - 15.2 seconds   INR 1.30 0.00 - 1.49  Glucose, capillary     Status: None   Collection Time: 05/02/15  1:25 AM  Result Value Ref Range   Glucose-Capillary 94 65 - 99 mg/dL  Comprehensive metabolic panel     Status: Abnormal   Collection Time: 05/02/15  4:36 AM  Result Value Ref Range   Sodium 141 135 - 145 mmol/L   Potassium 3.3 (L) 3.5 - 5.1 mmol/L   Chloride 110 101 - 111 mmol/L   CO2 20 (L) 22 - 32 mmol/L   Glucose, Bld 78 65 - 99 mg/dL   BUN 9 6 - 20 mg/dL   Creatinine, Ser 0.69 0.44 - 1.00 mg/dL   Calcium 8.4 (L) 8.9 - 10.3 mg/dL   Total Protein 5.6 (L) 6.5 - 8.1 g/dL   Albumin 3.4 (L) 3.5 - 5.0 g/dL   AST 23 15 - 41 U/L   ALT 20 14 - 54 U/L   Alkaline Phosphatase 54 38 - 126 U/L   Total Bilirubin 1.1 0.3 - 1.2 mg/dL   GFR calc non Af Amer >60 >60 mL/min   GFR calc Af Amer >60 >60 mL/min    Comment: (NOTE) The eGFR has been calculated using the CKD EPI equation. This calculation has not been validated in all clinical situations. eGFR's persistently <60 mL/min signify possible Chronic Kidney Disease.    Anion gap 11 5 - 15  CBC WITH DIFFERENTIAL     Status: Abnormal   Collection Time: 05/02/15  4:36 AM  Result Value Ref Range   WBC 9.6 4.0 - 10.5 K/uL   RBC 3.88 3.87 - 5.11 MIL/uL   Hemoglobin 11.6 (L) 12.0 - 15.0 g/dL    Comment: REPEATED TO VERIFY DELTA CHECK NOTED    HCT 35.1 (L) 36.0 - 46.0 %   MCV 90.5 78.0 - 100.0 fL   MCH 29.9 26.0 - 34.0 pg   MCHC 33.0 30.0 - 36.0 g/dL   RDW 13.3 11.5 - 15.5 %   Platelets 216 150 - 400 K/uL   Neutrophils Relative % 63 %   Neutro Abs 6.0 1.7 - 7.7 K/uL   Lymphocytes Relative 31 %   Lymphs Abs 2.9 0.7 - 4.0 K/uL   Monocytes Relative 6 %   Monocytes Absolute 0.6 0.1 - 1.0 K/uL   Eosinophils Relative 0 %   Eosinophils Absolute 0.0 0.0 - 0.7 K/uL   Basophils Relative 0 %   Basophils Absolute 0.0 0.0 - 0.1 K/uL  Glucose, capillary     Status: Abnormal   Collection Time: 05/02/15  6:30 AM  Result  Value Ref Range   Glucose-Capillary 64 (L) 65 - 99 mg/dL  Glucose, capillary     Status: None   Collection Time: 05/02/15  7:34 AM  Result Value Ref Range   Glucose-Capillary 90 65 - 99 mg/dL   Comment 1 Notify RN    Comment 2 Document in Chart   APTT     Status: Abnormal  Collection Time: 05/02/15  7:47 AM  Result Value Ref Range   aPTT 58 (H) 24 - 37 seconds    Comment:        IF BASELINE aPTT IS ELEVATED, SUGGEST PATIENT RISK ASSESSMENT BE USED TO DETERMINE APPROPRIATE ANTICOAGULANT THERAPY.   Heparin level (unfractionated)     Status: Abnormal   Collection Time: 05/02/15  7:47 AM  Result Value Ref Range   Heparin Unfractionated 1.30 (H) 0.30 - 0.70 IU/mL    Comment: RESULTS CONFIRMED BY MANUAL DILUTION        IF HEPARIN RESULTS ARE BELOW EXPECTED VALUES, AND PATIENT DOSAGE HAS BEEN CONFIRMED, SUGGEST FOLLOW UP TESTING OF ANTITHROMBIN III LEVELS.    Ct Abdomen Pelvis W Contrast  05/01/2015  CLINICAL DATA:  Severe diffuse abdominal pain and constipation for 2 weeks. EXAM: CT ABDOMEN AND PELVIS WITH CONTRAST TECHNIQUE: Multidetector CT imaging of the abdomen and pelvis was performed using the standard protocol following bolus administration of intravenous contrast. CONTRAST:  174m OMNIPAQUE IOHEXOL 300 MG/ML  SOLN COMPARISON:  12/10/2013 FINDINGS: Lower chest:  No acute findings. Hepatobiliary: No masses or other significant abnormality. Gallbladder is unremarkable. Pancreas: No mass, inflammatory changes, or other significant abnormality. Spleen: Within normal limits in size and appearance. Adrenals/Urinary Tract: No masses identified. No evidence of hydronephrosis. Duplicated left renal collecting system incidentally noted. Stomach/Bowel: No evidence of small bowel dilatation. Diffuse colonic dilatation is seen with multiple air-fluid levels. Diverticulosis is seen involving the sigmoid colon, without definite acute pericolonic inflammatory changes. There is a relatively short  segment of wall thickening seen involving the mid sigmoid colon on image 60/series 2. Although this could be due to mild diverticulitis or muscular hypertrophy, obstructing colon carcinoma cannot definitely be excluded. Vascular/Lymphatic: No pathologically enlarged lymph nodes. No evidence of abdominal aortic aneurysm. Reproductive: Prior hysterectomy noted. Adnexal regions are unremarkable in appearance. A Other: None. Musculoskeletal:  No suspicious bone lesions identified. IMPRESSION: Findings consistent with distal colonic obstruction in the mid sigmoid colon. There is evidence of diverticulosis in this region CT on but there is also a short segment area of concentric wall thickening without acute pericolonic inflammatory changes. Differential diagnosis includes mild diverticulitis, muscular hypertrophy, and an obstructing colon carcinoma cannot definitely be excluded. Recommend clinical correlation, and consider sigmoidoscopy for further evaluation. No evidence of abscess or metastatic disease within the abdomen or pelvis. Electronically Signed   By: JEarle GellM.D.   On: 05/01/2015 16:40      Assessment/Plan Colonic obstruction ?diverticulitis, mass? -Slow prep over the next few days, miralax BID, fleet enema -Continue clears as tolerated -Ambulate and IS -SCD's and heparin infusion, will need to be held 6-8 hours before OR -Will plan for OR on Thursday for laparoscopic colectomy with possible ostomy -Continue Zosyn Day #2  AFIB with RVR, A flutter -Cards clearance, was pending appt with Dr. ARayann Hemannext week Asthma H/o spontaneous pneumothorax s/p LLL resection HTN Tobacco abuse   MNat Christen PMar[facility] Center[facility] 05/02/2015, 10:45 AM Pager: [phone](7am - 4:30pm M-F; 7am - 11:30am Sa/Su)

## 2015-05-02 NOTE — Consult Note (Signed)
 Referring Provider: No ref. provider found Primary Care Physician:  Thomas Jones, MD Primary Gastroenterologist:  Dr. Pyrtle  Reason for Consultation:  Sigmoid obstruction  HPI: Elizabeth Mcconnell is a 69 y.o. female with history of persistent atrial fibrillation (on Eliquis as outpatient), atrial flutter status post ablation, asthma was referred to the ER after outpatient CAT scan showed colonic obstruction. Patient has been experiencing abdominal pain off and on for last 1 year. Patient has been taking antibiotics off and on for diverticular issues. Last 2 weeks patient's abdominal pain has worsened.  Was seen in our office by Dr. Pyrtle on 2/24 (see note for more details) when he ordered CT scan.  Patient was referred to the ER when CT scan showed distal colonic obstruction in the mid-sigmoid colon.  Was given Moviprep bowel prep to drink on Friday.  Drank that and was up all night Friday with vomiting, but no BM.  Was advised to use enema on Saturday when she called on-call doc, but she did not do that since then she started having small amount of loose stools.  Was able to tolerate some food over the weekend and proceeded with CT scan 2/27.  Feels ok this AM.  Tolerating liquids.  Surgery to see.  Last colonoscopy 10/2013 by Dr. Brodie, which showed severe sigmoid diverticulosis and one polyp that was removed and was tubular adenoma on pathology.   Past Medical History  Diagnosis Date  . Tobacco abuse   . Allergic rhinitis   . Oral candidiasis   . Atrial flutter (HCC)     failed cardioversion 11/2012; s/p ablation 02-02-2013 by Dr Allred  . Asthma   . Pneumothorax on left 1996    left lower lung collapse s/p resection  . Osteopenia 08/2013     T score -2.3 FRAX 17%/3.0% stable from prior DEXA  . ALLERGIC RHINITIS   . Endometriosis   . Adenomatous polyp of colon 10/2013    f/u colo 10/2018  . Atrial fibrillation with RVR (HCC)   . Essential hypertension 05/30/2014  . Diverticulosis    . Chronic bronchitis (HCC)     "I'll have it most years" (02/14/2015)  . Arthritis     "neck, back, hands" (02/14/2015)  . ANXIETY     "situational" (02/14/2015)    Past Surgical History  Procedure Laterality Date  . Lung removal, partial Left 02/1965    LLL  . Cesarean section  1976; 1978  . Total abdominal hysterectomy w/ bilateral salpingoophorectomy  1984    Endometriosis  . Cardioversion N/A 11/27/2012    Procedure: CARDIOVERSION;  Surgeon: Philip J Nahser, MD;  Location: [facility] ENDOSCOPY;  Service: Cardiovascular;  Laterality: N/A;  . Atrial flutter ablation N/A 02/02/2013    Procedure: ATRIAL FLUTTER ABLATION;  Surgeon: James D Allred, MD;  Location: [facility] CATH LAB;  Service: Cardiovascular;  Laterality: N/A;  . Appendectomy  1978    Prior to Admission medications   Medication Sig Start Date End Date Taking? Authorizing Provider  acetaminophen (TYLENOL) 650 MG CR tablet Take 1,300 mg by mouth at bedtime. Second dose during day if needed   Yes Historical Provider, MD  diltiazem (CARDIZEM CD) 300 MG 24 hr capsule TAKE 1 CAPSULE BY MOUTH NIGHTLY AT BEDTIME 04/10/15  Yes Michael Cooper, MD  dofetilide (TIKOSYN) 250 MCG capsule Take 1 capsule (250 mcg total) by mouth 2 (two) times daily. 02/17/15  Yes Amber K Seiler, NP  ELIQUIS 5 MG TABS tablet TAKE 1 TABLET BY MOUTH 2   TIMES DAILY 11/30/14  Yes Michael Cooper, MD  estradiol (VIVELLE-DOT) 0.05 MG/24HR patch Place 1 patch onto the skin 2 (two) times a week. 04/24/15  Yes Historical Provider, MD  fluticasone (FLONASE) 50 MCG/ACT nasal spray Place 2 sprays into both nostrils twice daily. 04/24/15  Yes Tammy S Parrett, NP  LINZESS 145 MCG CAPS capsule TAKE 1 CAPSULE BY MOUTH EVERY DAY 02/28/15  Yes Amy S Esterwood, PA-C  magnesium oxide (MAG-OX) 400 MG tablet Take 1 tablet (400 mg total) by mouth daily. 02/13/15  Yes Michael Cooper, MD  Melatonin 3 MG TABS Take 1 tablet by mouth daily as needed. For sleep   Yes Historical Provider, MD    mometasone-formoterol (DULERA) 200-5 MCG/ACT AERO Inhale 2 puffs into the lungs 2 (two) times daily. 03/24/15  Yes Jennings E Nestor, MD  montelukast (SINGULAIR) 10 MG tablet TAKE 1 TABLET BY MOUTH EVERY DAY 03/07/15  Yes Jennings E Nestor, MD  polyethylene glycol (MIRALAX / GLYCOLAX) packet Take 17 g by mouth daily.   Yes Historical Provider, MD  Sennosides-Docusate Sodium (PERI-COLACE PO) Take 1 tablet by mouth daily.   Yes Historical Provider, MD  sodium phosphate Pediatric (FLEET) 3.5-9.5 GM/59ML enema Place 1 enema rectally once as needed for severe constipation.   Yes Historical Provider, MD  estradiol (VIVELLE-DOT) 0.075 MG/24HR PLACE ONE PATCH onto THE SKIN TWICE A WEEK 08/09/14   Timothy P Fontaine, MD  MOVIPREP 100 g SOLR Take 1 kit (200 g total) by mouth once. 04/28/15   Jay M Pyrtle, MD  saccharomyces boulardii (FLORASTOR) 250 MG capsule Take 1 capsule (250 mg total) by mouth 2 (two) times daily. 01/24/15   Amy S Esterwood, PA-C  XOPENEX HFA 45 MCG/ACT inhaler USE ONE TO TWO PUFFS EVERY 4 HOURS AS NEEDED Patient taking differently: USE ONE TO TWO PUFFS EVERY 4 HOURS AS NEEDED SOB 03/23/15   Jennings E Nestor, MD    Current Facility-Administered Medications  Medication Dose Route Frequency Provider Last Rate Last Dose  . 0.9 %  sodium chloride infusion   Intravenous Continuous Arshad N Kakrakandy, MD 50 mL/hr at 05/01/15 2305    . acetaminophen (TYLENOL) tablet 650 mg  650 mg Oral Q6H PRN Arshad N Kakrakandy, MD       Or  . acetaminophen (TYLENOL) suppository 650 mg  650 mg Rectal Q6H PRN Arshad N Kakrakandy, MD      . diltiazem (CARDIZEM CD) 24 hr capsule 300 mg  300 mg Oral QHS Arshad N Kakrakandy, MD   300 mg at 05/01/15 2343  . dofetilide (TIKOSYN) capsule 250 mcg  250 mcg Oral BID Arshad N Kakrakandy, MD   250 mcg at 05/01/15 2343  . fluticasone (FLONASE) 50 MCG/ACT nasal spray 2 spray  2 spray Each Nare Daily Arshad N Kakrakandy, MD      . heparin ADULT infusion 100 units/mL (25000  units/250 mL)  800 Units/hr Intravenous Continuous Elizabeth R Green, RPH 8 mL/hr at 05/01/15 2356 800 Units/hr at 05/01/15 2356  . Linaclotide (LINZESS) capsule 145 mcg  145 mcg Oral Daily Arshad N Kakrakandy, MD      . magnesium oxide (MAG-OX) tablet 400 mg  400 mg Oral Daily Arshad N Kakrakandy, MD      . mometasone-formoterol (DULERA) 200-5 MCG/ACT inhaler 2 puff  2 puff Inhalation BID Arshad N Kakrakandy, MD      . montelukast (SINGULAIR) tablet 10 mg  10 mg Oral Daily Arshad N Kakrakandy, MD      . morphine 2   MG/ML injection 1 mg  1 mg Intravenous Q4H PRN Arshad N Kakrakandy, MD      . ondansetron (ZOFRAN) tablet 4 mg  4 mg Oral Q6H PRN Arshad N Kakrakandy, MD       Or  . ondansetron (ZOFRAN) injection 4 mg  4 mg Intravenous Q6H PRN Arshad N Kakrakandy, MD      . piperacillin-tazobactam (ZOSYN) IVPB 3.375 g  3.375 g Intravenous Q8H Elizabeth R Green, RPH   3.375 g at 05/02/15 0631  . saccharomyces boulardii (FLORASTOR) capsule 250 mg  250 mg Oral BID Arshad N Kakrakandy, MD   250 mg at 05/01/15 2343    Allergies as of 05/01/2015 - Review Complete 05/01/2015  Allergen Reaction Noted  . Tramadol Other (See Comments) 03/29/2013  . Amoxicillin-pot clavulanate Nausea And Vomiting 10/03/2010  . Flagyl [metronidazole] Nausea And Vomiting 10/24/2014  . Flecainide  01/21/2013  . Omnicef [cefdinir] Nausea And Vomiting 11/14/2014  . Avelox [moxifloxacin] Hives, Itching, and Rash     Family History  Problem Relation Age of Onset  . Stroke Father 50  . Hypertension Father   . Heart disease Father   . Diabetes Mother   . Colon cancer Neg Hx   . Rectal cancer Neg Hx   . Stomach cancer Neg Hx   . Asthma Maternal Grandfather   . Asthma Child     Social History   Social History  . Marital Status: Married    Spouse Name: N/A  . Number of Children: 2  . Years of Education: N/A   Occupational History  . 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     Social History Main Topics  .  Smoking status: Current Every Day Smoker -- 0.25 packs/day for 43 years    Types: Cigarettes  . Smokeless tobacco: Never Used  . Alcohol Use: 18.0 oz/week    14 Standard drinks or equivalent, 16 Glasses of wine per week     Comment: daily wine  . Drug Use: No  . Sexual Activity: Yes    Birth Control/ Protection: Surgical, Post-menopausal     Comment: HYST-1st intercourse 70 yo-Fewer than 5 partners   Other Topics Concern  . Not on file   Social History Narrative   Lives with spouse;    2 children      [facility]:   She is from [place]. She has an Associates Degree from a [employer] in [employer]. Previously has attended business school. 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 She has also worked for [employer]. She does have a cat currently. She has indoor plants. No mold exposure.    Review of Systems: Ten point ROS is O/W negative except as mentioned in HPI.  Physical Exam: Vital signs in last 24 hours: Temp:  [97.7 F (36.5 C)-98.1 F (36.7 C)] 97.9 F (36.6 C) (02/28 0109) Pulse Rate:  [82-127] 82 (02/28 0109) Resp:  [18-23] 18 (02/28 0109) BP: (108-138)/(53-104) 124/59 mmHg (02/28 0109) SpO2:  [97 %-100 %] 99 % (02/28 0109) Weight:  [142 lb 14.4 oz (64.819 kg)] 142 lb 14.4 oz (64.819 kg) (02/28 0109) Last BM Date: 05/01/15 General:  Alert, Well-developed, well-nourished, pleasant and cooperative in NAD Head:  Normocephalic and atraumatic. Eyes:  Sclera clear, no icterus.  Conjunctiva pink. Ears:  Normal auditory acuity. Mouth:  No deformity or lesions.   Lungs:  Clear throughout to auscultation.  No wheezes, crackles, or rhonchi.  Heart:  Regular rate and rhythm;   no murmurs, clicks, rubs,  or gallops. Abdomen:  Soft, minimal distention.  BS present.  Minimal lower abdominal TTP.   Rectal:  Deferred  Msk:  Symmetrical without gross deformities. Pulses:  Normal pulses noted. Extremities:  Without clubbing or edema. Neurologic:   Alert and  oriented x4;  grossly normal neurologically. Skin:  Intact without significant lesions or rashes. Psych:  Alert and cooperative. Normal mood and affect.  Intake/Output from previous day: 02/27 0701 - 02/28 0700 In: 622.4 [P.O.:120; I.V.:452.4; IV Piggyback:50] Out: 75 [Urine:75]  Lab Results:  Recent Labs  05/01/15 1915 05/02/15 0436  WBC 12.2* 9.6  HGB 15.0 11.6*  HCT 46.3* 35.1*  PLT 290 216   BMET  Recent Labs  05/01/15 1915 05/02/15 0436  NA 136 141  K 3.1* 3.3*  CL 101 110  CO2 21* 20*  GLUCOSE 103* 78  BUN 10 9  CREATININE 0.94 0.69  CALCIUM 9.1 8.4*   LFT  Recent Labs  05/02/15 0436  PROT 5.6*  ALBUMIN 3.4*  AST 23  ALT 20  ALKPHOS 54  BILITOT 1.1   PT/INR  Recent Labs  05/01/15 2358  LABPROT 15.8*  INR 1.30   Studies/Results: Ct Abdomen Pelvis W Contrast  05/01/2015  CLINICAL DATA:  Severe diffuse abdominal pain and constipation for 2 weeks. EXAM: CT ABDOMEN AND PELVIS WITH CONTRAST TECHNIQUE: Multidetector CT imaging of the abdomen and pelvis was performed using the standard protocol following bolus administration of intravenous contrast. CONTRAST:  165m OMNIPAQUE IOHEXOL 300 MG/ML  SOLN COMPARISON:  12/10/2013 FINDINGS: Lower chest:  No acute findings. Hepatobiliary: No masses or other significant abnormality. Gallbladder is unremarkable. Pancreas: No mass, inflammatory changes, or other significant abnormality. Spleen: Within normal limits in size and appearance. Adrenals/Urinary Tract: No masses identified. No evidence of hydronephrosis. Duplicated left renal collecting system incidentally noted. Stomach/Bowel: No evidence of small bowel dilatation. Diffuse colonic dilatation is seen with multiple air-fluid levels. Diverticulosis is seen involving the sigmoid colon, without definite acute pericolonic inflammatory changes. There is a relatively short segment of wall thickening seen involving the mid sigmoid colon on image 60/series 2.  Although this could be due to mild diverticulitis or muscular hypertrophy, obstructing colon carcinoma cannot definitely be excluded. Vascular/Lymphatic: No pathologically enlarged lymph nodes. No evidence of abdominal aortic aneurysm. Reproductive: Prior hysterectomy noted. Adnexal regions are unremarkable in appearance. A Other: None. Musculoskeletal:  No suspicious bone lesions identified. IMPRESSION: Findings consistent with distal colonic obstruction in the mid sigmoid colon. There is evidence of diverticulosis in this region CT on but there is also a short segment area of concentric wall thickening without acute pericolonic inflammatory changes. Differential diagnosis includes mild diverticulitis, muscular hypertrophy, and an obstructing colon carcinoma cannot definitely be excluded. Recommend clinical correlation, and consider sigmoidoscopy for further evaluation. No evidence of abscess or metastatic disease within the abdomen or pelvis. Electronically Signed   By: JEarle GellM.D.   On: 05/01/2015 16:40    IMPRESSION:  *70year old female long-standing issues with severe, symptomatic sigmoid diverticulosis/recurrent diverticulitis who now presents with sigmoid colonic obstruction.  Last colonoscopy was only 1.5 years ago.   -Atrial fibrillation:  On Eliquis at home.  That has been held and she is on heparin gtt here.  PLAN: -Clear liquids, antiemetics, and pain medication prn. -Surgical consultation pending. -If surgery feels strongly that flex sig will be useful prior to surgery then we can plan for that, but this is very likely benign disease and may not have  any added benefit.   ZEHR, JESSICA D.  05/02/2015, 8:41 AM  Pager number [phone]  GI ATTENDING  History, laboratories, x-rays, and prior endoscopy reports reviewed. Patient personally seen and examined. Agree with consultation note as outlined above. The patient presents with symptomatic colonic obstructive symptoms. CT scan shows  distal colonic obstruction in the region of the sigmoid colon. Colonoscopy 18 months ago demonstrated severe benign diverticular disease with stenosis and a diminutive polyp. Suspect this is the etiology of her current problem. Surgery has been consulted for resection. Not clear to me that flexible sigmoidoscopy add anything or changes management. If for some reason desired, let us know. We are available if needed. Thank you   N. , Jr., M.D. [facility] Division of Gastroenterology    

## 2015-05-02 NOTE — Consult Note (Signed)
Reason for Consult:  A fib with RVR  Referring Physician: Dr. Eliseo Squires   PCP:  Elizabeth Calico, MD  Primary Cardiologist:Dr. Burt Knack EP Dr. Ronny Bacon Zakya Halabi is an 70 y.o. female.    Chief Complaint: Abd pain, and colonic obstruction on CAT scan.    HPI: 49 year old pt with hx of PAF and PA flutter, undergoing a flutter ablation by Dr. Rayann Heman in 2014.  She has been intolerant to flecainide --cardiac event monitor 05/2014 with SR with 2% a fib with RVR.  She is on eliquis.  In Dec she was placed on Tikosyn 250 mcg. for continued break through PAF.  On last visit she was stable with SR.  Last echo 02/2015 EF 60-65% mild MR, I do not find any stress test or cardiac cath she has never had- once she was scheduled for ETT but she was in a fib so it was cancelled.    Now admitted with abdominal pain and colonic obstruction ?diverticulitis or mass.   Placed on IV antibiotics and GI and surgical consults.  On admit she went into a fib with RVR now back in SR.  She has rec'd her cardizem and Tikosyn.  She will be NPO for OR on Thursday for colectomy with possible ostomy.     She is now on Heparin Eliquis held.  Her K+ was 3.3 and has been replaced.  Pt denies any chest pain.  She has some DOE but nothing new for her.      Past Medical History  Diagnosis Date  . Tobacco abuse   . Allergic rhinitis   . Oral candidiasis   . Atrial flutter ([place])     failed cardioversion 11/2012; s/p ablation 02-02-2013 by Dr Rayann Heman  . Asthma   . Pneumothorax on left 1996    left lower lung collapse s/p resection  . Osteopenia 08/2013     T score -2.3 FRAX 17%/3.0% stable from prior DEXA  . ALLERGIC RHINITIS   . Endometriosis   . Adenomatous polyp of colon 10/2013    f/u colo 10/2018  . Atrial fibrillation with RVR ([place])   . Essential hypertension 05/30/2014  . Diverticulosis   . Chronic bronchitis ([place])     "I'll have it most years" (02/14/2015)  . Arthritis     "neck, back, hands"  (02/14/2015)  . ANXIETY     "situational" (02/14/2015)    Past Surgical History  Procedure Laterality Date  . Lung removal, partial Left 02/1965    LLL  . Cesarean section  1976; 1978  . Total abdominal hysterectomy w/ bilateral salpingoophorectomy  1984    Endometriosis  . Cardioversion N/A 11/27/2012    Procedure: CARDIOVERSION;  Surgeon: Thayer Headings, MD;  Location: [facility] ENDOSCOPY;  Service: Cardiovascular;  Laterality: N/A;  . Atrial flutter ablation N/A 02/02/2013    Procedure: ATRIAL FLUTTER ABLATION;  Surgeon: Coralyn , MD;  Location: [facility] CATH LAB;  Service: Cardiovascular;  Laterality: N/A;  . Appendectomy  1978    Family History  Problem Relation Age of Onset  . Stroke Father 11  . Hypertension Father   . Heart disease Father   . Diabetes Mother   . Colon cancer Neg Hx   . Rectal cancer Neg Hx   . Stomach cancer Neg Hx   . Asthma Maternal Grandfather   . Asthma Child    Social History:  reports that she has been smoking Cigarettes.  She  has a 10.75 pack-year smoking history. She has never used smokeless tobacco. She reports that she drinks about 18.0 oz of alcohol per week. She reports that she does not use illicit drugs.  Allergies:  Allergies  Allergen Reactions  . Tramadol Other (See Comments)    Severe dizziness, weakness  . Amoxicillin-Pot Clavulanate Nausea And Vomiting  . Flagyl [Metronidazole] Nausea And Vomiting  . Flecainide     dizziness  . Omnicef [Cefdinir] Nausea And Vomiting  . Avelox [Moxifloxacin] Hives, Itching and Rash    OUTPATIENT MEDICATIONS: No current facility-administered medications on file prior to encounter.   Current Outpatient Prescriptions on File Prior to Encounter  Medication Sig Dispense Refill  . acetaminophen (TYLENOL) 650 MG CR tablet Take 1,300 mg by mouth at bedtime. Second dose during day if needed    . diltiazem (CARDIZEM CD) 300 MG 24 hr capsule TAKE 1 CAPSULE BY MOUTH NIGHTLY AT BEDTIME 30 capsule 11  .  dofetilide (TIKOSYN) 250 MCG capsule Take 1 capsule (250 mcg total) by mouth 2 (two) times daily. 14 capsule 0  . ELIQUIS 5 MG TABS tablet TAKE 1 TABLET BY MOUTH 2 TIMES DAILY 60 tablet 5  . fluticasone (FLONASE) 50 MCG/ACT nasal spray Place 2 sprays into both nostrils twice daily. 16 g 2  . LINZESS 145 MCG CAPS capsule TAKE 1 CAPSULE BY MOUTH EVERY DAY 30 capsule 5  . magnesium oxide (MAG-OX) 400 MG tablet Take 1 tablet (400 mg total) by mouth daily. 30 tablet 6  . Melatonin 3 MG TABS Take 1 tablet by mouth daily as needed. For sleep    . mometasone-formoterol (DULERA) 200-5 MCG/ACT AERO Inhale 2 puffs into the lungs 2 (two) times daily. 1 Inhaler 0  . montelukast (SINGULAIR) 10 MG tablet TAKE 1 TABLET BY MOUTH EVERY DAY 30 tablet 2  . Sennosides-Docusate Sodium (PERI-COLACE PO) Take 1 tablet by mouth daily.    [name] estradiol (VIVELLE-DOT) 0.075 MG/24HR PLACE ONE PATCH onto THE SKIN TWICE A WEEK 4 patch 0  . MOVIPREP 100 g SOLR Take 1 kit (200 g total) by mouth once. 1 kit 0  . saccharomyces boulardii (FLORASTOR) 250 MG capsule Take 1 capsule (250 mg total) by mouth 2 (two) times daily. 60 capsule 1  . XOPENEX HFA 45 MCG/ACT inhaler USE ONE TO TWO PUFFS EVERY 4 HOURS AS NEEDED (Patient taking differently: USE ONE TO TWO PUFFS EVERY 4 HOURS AS NEEDED SOB) 15 g 3   CURRENT MEDICATIONS; Scheduled Meds: . diltiazem  300 mg Oral QHS  . dofetilide  250 mcg Oral BID  . feeding supplement  1 Container Oral BID BM  . fluticasone  2 spray Each Nare Daily  . Linaclotide  145 mcg Oral Daily  . magnesium oxide  400 mg Oral Daily  . mometasone-formoterol  2 puff Inhalation BID  . montelukast  10 mg Oral Daily  . piperacillin-tazobactam (ZOSYN)  IV  3.375 g Intravenous Q8H  . polyethylene glycol  17 g Oral BID  . saccharomyces boulardii  250 mg Oral BID  . sodium phosphate  1 enema Rectal Once   Continuous Infusions: . sodium chloride 50 mL/hr at 05/01/15 2305  . heparin 900 Units/hr (05/02/15 0939)    PRN Meds:.acetaminophen **OR** acetaminophen, morphine injection, ondansetron **OR** ondansetron (ZOFRAN) IV   Results for orders placed or performed during the hospital encounter of 05/01/15 (from the past 48 hour(s))  Lipase, blood     Status: None   Collection Time: 05/01/15  7:15  PM  Result Value Ref Range   Lipase 34 11 - 51 U/L  Comprehensive metabolic panel     Status: Abnormal   Collection Time: 05/01/15  7:15 PM  Result Value Ref Range   Sodium 136 135 - 145 mmol/L   Potassium 3.1 (L) 3.5 - 5.1 mmol/L   Chloride 101 101 - 111 mmol/L   CO2 21 (L) 22 - 32 mmol/L   Glucose, Bld 103 (H) 65 - 99 mg/dL   BUN 10 6 - 20 mg/dL   Creatinine, Ser 0.94 0.44 - 1.00 mg/dL   Calcium 9.1 8.9 - 10.3 mg/dL   Total Protein 7.3 6.5 - 8.1 g/dL   Albumin 4.4 3.5 - 5.0 g/dL   AST 33 15 - 41 U/L   ALT 27 14 - 54 U/L   Alkaline Phosphatase 76 38 - 126 U/L   Total Bilirubin 1.0 0.3 - 1.2 mg/dL   GFR calc non Af Amer 60 (L) >60 mL/min   GFR calc Af Amer >60 >60 mL/min    Comment: (NOTE) The eGFR has been calculated using the CKD EPI equation. This calculation has not been validated in all clinical situations. eGFR's persistently <60 mL/min signify possible Chronic Kidney Disease.    Anion gap 14 5 - 15  CBC     Status: Abnormal   Collection Time: 05/01/15  7:15 PM  Result Value Ref Range   WBC 12.2 (H) 4.0 - 10.5 K/uL   RBC 5.06 3.87 - 5.11 MIL/uL   Hemoglobin 15.0 12.0 - 15.0 g/dL   HCT 46.3 (H) 36.0 - 46.0 %   MCV 91.5 78.0 - 100.0 fL   MCH 29.6 26.0 - 34.0 pg   MCHC 32.4 30.0 - 36.0 g/dL   RDW 13.5 11.5 - 15.5 %   Platelets 290 150 - 400 K/uL  Urinalysis, Routine w reflex microscopic (not at [facility])     Status: Abnormal   Collection Time: 05/01/15  8:01 PM  Result Value Ref Range   Color, Urine YELLOW YELLOW   APPearance CLOUDY (A) CLEAR   Specific Gravity, Urine >1.046 (H) 1.005 - 1.030   pH 5.0 5.0 - 8.0   Glucose, UA NEGATIVE NEGATIVE mg/dL   Hgb urine dipstick NEGATIVE  NEGATIVE   Bilirubin Urine NEGATIVE NEGATIVE   Ketones, ur 40 (A) NEGATIVE mg/dL   Protein, ur NEGATIVE NEGATIVE mg/dL   Nitrite NEGATIVE NEGATIVE   Leukocytes, UA NEGATIVE NEGATIVE    Comment: MICROSCOPIC NOT DONE ON URINES WITH NEGATIVE PROTEIN, BLOOD, LEUKOCYTES, NITRITE, OR GLUCOSE <1000 mg/dL.  APTT     Status: None   Collection Time: 05/01/15 11:58 PM  Result Value Ref Range   aPTT 33 24 - 37 seconds  Heparin level (unfractionated)     Status: Abnormal   Collection Time: 05/01/15 11:58 PM  Result Value Ref Range   Heparin Unfractionated 1.90 (H) 0.30 - 0.70 IU/mL    Comment: RESULTS CONFIRMED BY MANUAL DILUTION        IF HEPARIN RESULTS ARE BELOW EXPECTED VALUES, AND PATIENT DOSAGE HAS BEEN CONFIRMED, SUGGEST FOLLOW UP TESTING OF ANTITHROMBIN III LEVELS.   Protime-INR     Status: Abnormal   Collection Time: 05/01/15 11:58 PM  Result Value Ref Range   Prothrombin Time 15.8 (H) 11.6 - 15.2 seconds   INR 1.30 0.00 - 1.49  Glucose, capillary     Status: None   Collection Time: 05/02/15  1:25 AM  Result Value Ref Range   Glucose-Capillary 94 65 -  99 mg/dL  Comprehensive metabolic panel     Status: Abnormal   Collection Time: 05/02/15  4:36 AM  Result Value Ref Range   Sodium 141 135 - 145 mmol/L   Potassium 3.3 (L) 3.5 - 5.1 mmol/L   Chloride 110 101 - 111 mmol/L   CO2 20 (L) 22 - 32 mmol/L   Glucose, Bld 78 65 - 99 mg/dL   BUN 9 6 - 20 mg/dL   Creatinine, Ser 0.69 0.44 - 1.00 mg/dL   Calcium 8.4 (L) 8.9 - 10.3 mg/dL   Total Protein 5.6 (L) 6.5 - 8.1 g/dL   Albumin 3.4 (L) 3.5 - 5.0 g/dL   AST 23 15 - 41 U/L   ALT 20 14 - 54 U/L   Alkaline Phosphatase 54 38 - 126 U/L   Total Bilirubin 1.1 0.3 - 1.2 mg/dL   GFR calc non Af Amer >60 >60 mL/min   GFR calc Af Amer >60 >60 mL/min    Comment: (NOTE) The eGFR has been calculated using the CKD EPI equation. This calculation has not been validated in all clinical situations. eGFR's persistently <60 mL/min signify  possible Chronic Kidney Disease.    Anion gap 11 5 - 15  CBC WITH DIFFERENTIAL     Status: Abnormal   Collection Time: 05/02/15  4:36 AM  Result Value Ref Range   WBC 9.6 4.0 - 10.5 K/uL   RBC 3.88 3.87 - 5.11 MIL/uL   Hemoglobin 11.6 (L) 12.0 - 15.0 g/dL    Comment: REPEATED TO VERIFY DELTA CHECK NOTED    HCT 35.1 (L) 36.0 - 46.0 %   MCV 90.5 78.0 - 100.0 fL   MCH 29.9 26.0 - 34.0 pg   MCHC 33.0 30.0 - 36.0 g/dL   RDW 13.3 11.5 - 15.5 %   Platelets 216 150 - 400 K/uL   Neutrophils Relative % 63 %   Neutro Abs 6.0 1.7 - 7.7 K/uL   Lymphocytes Relative 31 %   Lymphs Abs 2.9 0.7 - 4.0 K/uL   Monocytes Relative 6 %   Monocytes Absolute 0.6 0.1 - 1.0 K/uL   Eosinophils Relative 0 %   Eosinophils Absolute 0.0 0.0 - 0.7 K/uL   Basophils Relative 0 %   Basophils Absolute 0.0 0.0 - 0.1 K/uL  Glucose, capillary     Status: Abnormal   Collection Time: 05/02/15  6:30 AM  Result Value Ref Range   Glucose-Capillary 64 (L) 65 - 99 mg/dL  Glucose, capillary     Status: None   Collection Time: 05/02/15  7:34 AM  Result Value Ref Range   Glucose-Capillary 90 65 - 99 mg/dL   Comment 1 Notify RN    Comment 2 Document in Chart   APTT     Status: Abnormal   Collection Time: 05/02/15  7:47 AM  Result Value Ref Range   aPTT 58 (H) 24 - 37 seconds    Comment:        IF BASELINE aPTT IS ELEVATED, SUGGEST PATIENT RISK ASSESSMENT BE USED TO DETERMINE APPROPRIATE ANTICOAGULANT THERAPY.   Heparin level (unfractionated)     Status: Abnormal   Collection Time: 05/02/15  7:47 AM  Result Value Ref Range   Heparin Unfractionated 1.30 (H) 0.30 - 0.70 IU/mL    Comment: RESULTS CONFIRMED BY MANUAL DILUTION        IF HEPARIN RESULTS ARE BELOW EXPECTED VALUES, AND PATIENT DOSAGE HAS BEEN CONFIRMED, SUGGEST FOLLOW UP TESTING OF ANTITHROMBIN III LEVELS.  Glucose, capillary     Status: None   Collection Time: 05/02/15 11:51 AM  Result Value Ref Range   Glucose-Capillary 81 65 - 99 mg/dL   Ct  Abdomen Pelvis W Contrast  05/01/2015  CLINICAL DATA:  Severe diffuse abdominal pain and constipation for 2 weeks. EXAM: CT ABDOMEN AND PELVIS WITH CONTRAST TECHNIQUE: Multidetector CT imaging of the abdomen and pelvis was performed using the standard protocol following bolus administration of intravenous contrast. CONTRAST:  15m OMNIPAQUE IOHEXOL 300 MG/ML  SOLN COMPARISON:  12/10/2013 FINDINGS: Lower chest:  No acute findings. Hepatobiliary: No masses or other significant abnormality. Gallbladder is unremarkable. Pancreas: No mass, inflammatory changes, or other significant abnormality. Spleen: Within normal limits in size and appearance. Adrenals/Urinary Tract: No masses identified. No evidence of hydronephrosis. Duplicated left renal collecting system incidentally noted. Stomach/Bowel: No evidence of small bowel dilatation. Diffuse colonic dilatation is seen with multiple air-fluid levels. Diverticulosis is seen involving the sigmoid colon, without definite acute pericolonic inflammatory changes. There is a relatively short segment of wall thickening seen involving the mid sigmoid colon on image 60/series 2. Although this could be due to mild diverticulitis or muscular hypertrophy, obstructing colon carcinoma cannot definitely be excluded. Vascular/Lymphatic: No pathologically enlarged lymph nodes. No evidence of abdominal aortic aneurysm. Reproductive: Prior hysterectomy noted. Adnexal regions are unremarkable in appearance. A Other: None. Musculoskeletal:  No suspicious bone lesions identified. IMPRESSION: Findings consistent with distal colonic obstruction in the mid sigmoid colon. There is evidence of diverticulosis in this region CT on but there is also a short segment area of concentric wall thickening without acute pericolonic inflammatory changes. Differential diagnosis includes mild diverticulitis, muscular hypertrophy, and an obstructing colon carcinoma cannot definitely be excluded. Recommend  clinical correlation, and consider sigmoidoscopy for further evaluation. No evidence of abscess or metastatic disease within the abdomen or pelvis. Electronically Signed   By: JEarle GellM.D.   On: 05/01/2015 16:40    ROS: General:no colds or fevers, some weight loss Skin:no rashes or ulcers HEENT:no blurred vision, no congestion CV:see HPI PUL:see HPI GI:no diarrhea constipation or melena, no indigestion, + abd pain GU:no hematuria, no dysuria MS:no joint pain, no claudication Neuro:no syncope, no lightheadedness Endo:no diabetes, no thyroid disease   Blood pressure 116/61, pulse 64, temperature 97.8 F (36.6 C), temperature source Oral, resp. rate 18, height 5' 5"  (1.651 m), weight 142 lb 14.4 oz (64.819 kg), SpO2 100 %.  Wt Readings from Last 3 Encounters:  05/02/15 142 lb 14.4 oz (64.819 kg)  04/28/15 147 lb 4 oz (66.792 kg)  03/20/15 145 lb 4 oz (65.885 kg)    PE: General:Pleasant affect, NAD Skin:Warm and dry, brisk capillary refill HEENT:normocephalic, sclera clear, mucus membranes moist Neck:supple, no JVD, no bruits  Heart:S1S2 RRR without murmur, gallup, rub or click Lungs:clear without rales, rhonchi, or wheezes ASLH:TDSK mild Rt mid abd tenderness, + BS, do not palpate liver spleen or masses Ext:no lower ext edema, 2+ pedal pulses, 2+ radial pulses Neuro:alert and oriented, MAE, follows commands, + facial symmetry Tele:  SR now since she converted last pm   Assessment/Plan Principal Problem:   Colonic obstruction (HCC) Active Problems:   Asthma, moderate persistent   Atrial fibrillation with RVR (HCC)   Stricture of sigmoid colon with obstruction  1. PAF hx of a fib ablation in 2014 and does not tolerate flecainide, on tikosyn with decreased  PAF through after arrived in ER PAF with RVR.  Currently in SR.  Will be NPO for surgery and  post op. Without tikosyn she most likely will have break trough a fib.  Would place on IV dilt but will have Dr. Rayann Heman to give  opinion.     2. No awareness of CAD last echo 02/2015 with normal EF. No chest pain no change in mild DOE.  Dr. [name] to see. Will check EKG.    3. Colonic obstruction plan for OR on Thursday  4. Hypokalemia keep K+ 4 and mag at [place] Pager [phone] or after 5pm or weekends call [phone] 05/02/2015, 1:25 PM     Personally seen and examined. Agree with above.  70 year old with parox AFIB, Tikosyn, Diltiazem, Eliquis here with colonic obstruction.   Preoperative risk stratification - Prior to this occurrence, she was able to complete greater than 4 METS of activity without difficulty with no anginal symptoms. No prior history of CAD. Normal ejection fraction. She may proceed with abdominal surgery from a cardiac standpoint with low overall risk. Main issue will be paroxysmal atrial fibrillation. Currently RRR, CTAB.   Paroxysmal atrial fibrillation - She has required Tikosyn (antiarrhythmic) to maintain sinus rhythm. We have seen breakthrough paroxysmal atrial fibrillation with rapid ventricular response since being here in the hospital which likely was exacerbated by current inflammatory response. Of course if she is nothing by mouth, no medication, we would advise utilizing IV diltiazem to help control potential atrial fibrillation heart rate. Resume Tikosyn AS SOON AS POSSIBLE (even via NGT if necessary) with telemetry monitoring. Keep potassium greater than 4, magnesium greater than 2. Will need ECG's post reinitiation to ensure normal QT interval. Optimal 5 doses monitored.  Discussed with Dr. Rayann Heman.  -Post ablation 02/02/13-Dr. Allred  Chronic anticoagulation -Eliquis is currently on hold. -Heparin.  Will follow along.   Candee Furbish, MD

## 2015-05-02 NOTE — Progress Notes (Signed)
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 Continue inhalers.  Code Status: full Family Communication: husband at bedside Disposition Plan:    Consultants:  Cards  Surgery   Procedures:      HPI/Subjective: No SOB, no CP-- tolerating lunch No gas but lots of loose BMs  Objective: Filed Vitals:   05/02/15 1045 05/02/15 1303  BP:  116/61  Pulse: 78 64  Temp:  97.8 F (36.6 C)  Resp: 18 18    Intake/Output Summary (Last 24 hours) at 05/02/15 1624 Last data filed at 05/02/15 1200  Gross per 24 hour  Intake 622.36 ml  Output   1275 ml  Net -652.64 ml   Filed Weights   05/02/15 0109  Weight: 64.819 kg (142 lb 14.4 oz)    Exam:   General:  pleasant, NAD  Cardiovascular: rrr  Respiratory: clear  Abdomen: +BS, non tender  Musculoskeletal: no edema   Data Reviewed: Basic Metabolic Panel:  Recent Labs Lab 04/28/15 1641 05/01/15 1915 05/02/15 0436  NA 137 136 141  K 3.8 3.1* 3.3*  CL 103 101 110  CO2 27 21* 20*  GLUCOSE 89 103* 78  BUN 11 10 9   CREATININE 0.74 0.94 0.69  CALCIUM 9.3 9.1 8.4*   Liver Function Tests:  Recent Labs Lab 04/28/15 1641 05/01/15 1915 05/02/15 0436  AST 13 33 23  ALT 9 27 20   ALKPHOS 86 76 54  BILITOT 0.5 1.0 1.1  PROT 7.4 7.3 5.6*  ALBUMIN 4.3 4.4 3.4*    Recent Labs Lab 05/01/15 1915  LIPASE 34   No results for input(s): AMMONIA in the last 168 hours. CBC:  Recent Labs Lab 04/28/15 1641 05/01/15 1915 05/02/15 0436  WBC 9.1 12.2* 9.6  NEUTROABS 6.2  --  6.0  HGB 13.5 15.0 11.6*  HCT 40.2 46.3* 35.1*  MCV 87.7  91.5 90.5  PLT 265.0 290 216   Cardiac Enzymes: No results for input(s): CKTOTAL, CKMB, CKMBINDEX, TROPONINI in the last 168 hours. BNP (last 3 results) No results for input(s): BNP in the last 8760 hours.  ProBNP (last 3 results) No results for input(s): PROBNP in the last 8760 hours.  CBG:  Recent Labs Lab 05/02/15 0125 05/02/15 0630 05/02/15 0734 05/02/15 1151  GLUCAP 94 64* 90 81    No results found for this or any previous visit (from the past 240 hour(s)).   Studies: Ct Abdomen Pelvis W Contrast  05/01/2015  CLINICAL DATA:  Severe diffuse abdominal pain and constipation for 2 weeks. EXAM: CT ABDOMEN AND PELVIS WITH CONTRAST TECHNIQUE: Multidetector CT imaging of the abdomen and pelvis was performed using the standard protocol following bolus administration of intravenous contrast. CONTRAST:  156mL OMNIPAQUE IOHEXOL 300 MG/ML  SOLN COMPARISON:  12/10/2013 FINDINGS: Lower chest:  No acute findings. Hepatobiliary: No masses or other significant abnormality. Gallbladder is unremarkable. Pancreas: No mass, inflammatory changes, or other significant abnormality. Spleen: Within normal limits in size and appearance. Adrenals/Urinary Tract: No masses identified. No evidence of hydronephrosis. Duplicated left renal collecting system incidentally noted. Stomach/Bowel: No evidence of small bowel dilatation. Diffuse colonic dilatation is seen with multiple air-fluid levels. Diverticulosis is seen involving  the sigmoid colon, without definite acute pericolonic inflammatory changes. There is a relatively short segment of wall thickening seen involving the mid sigmoid colon on image 60/series 2. Although this could be due to mild diverticulitis or muscular hypertrophy, obstructing colon carcinoma cannot definitely be excluded. Vascular/Lymphatic: No pathologically enlarged lymph nodes. No evidence of abdominal aortic aneurysm. Reproductive: Prior hysterectomy noted. Adnexal regions are unremarkable  in appearance. A Other: None. Musculoskeletal:  No suspicious bone lesions identified. IMPRESSION: Findings consistent with distal colonic obstruction in the mid sigmoid colon. There is evidence of diverticulosis in this region CT on but there is also a short segment area of concentric wall thickening without acute pericolonic inflammatory changes. Differential diagnosis includes mild diverticulitis, muscular hypertrophy, and an obstructing colon carcinoma cannot definitely be excluded. Recommend clinical correlation, and consider sigmoidoscopy for further evaluation. No evidence of abscess or metastatic disease within the abdomen or pelvis. Electronically Signed   By: Earle Gell M.D.   On: 05/01/2015 16:40    Scheduled Meds: . [START ON 05/04/2015] alvimopan  12 mg Oral Once  . [START ON 05/04/2015] cefoTEtan (CEFOTAN) 2 GM IVPB  2 g Intravenous On Call to OR  . [START ON 05/03/2015] chlorhexidine  60 mL Topical Once   And  . [START ON 05/04/2015] chlorhexidine  60 mL Topical Once  . diltiazem  300 mg Oral QHS  . dofetilide  250 mcg Oral BID  . [START ON 05/04/2015] enoxaparin (LOVENOX) injection  40 mg Subcutaneous Once  . feeding supplement  1 Container Oral BID BM  . fluticasone  2 spray Each Nare Daily  . Linaclotide  145 mcg Oral Daily  . magnesium oxide  400 mg Oral Daily  . mometasone-formoterol  2 puff Inhalation BID  . montelukast  10 mg Oral Daily  . [START ON 05/03/2015] neomycin  1,000 mg Oral 3 times per day on Wed  . piperacillin-tazobactam (ZOSYN)  IV  3.375 g Intravenous Q8H  . polyethylene glycol  17 g Oral BID  . saccharomyces boulardii  250 mg Oral BID   Continuous Infusions: . sodium chloride 50 mL/hr at 05/01/15 2305  . heparin 900 Units/hr (05/02/15 0939)   Antibiotics Given (last 72 hours)    Date/Time Action Medication Dose Rate   05/02/15 0631 Given   piperacillin-tazobactam (ZOSYN) IVPB 3.375 g 3.375 g 12.5 mL/hr   05/02/15 1312 Given   piperacillin-tazobactam (ZOSYN)  IVPB 3.375 g 3.375 g 12.5 mL/hr      Principal Problem:   Colonic obstruction (HCC) Active Problems:   Asthma, moderate persistent   Diverticulosis of colon without hemorrhage   Atrial fibrillation with RVR (HCC)   Abdominal pain, left lower quadrant   Stricture of sigmoid colon with obstruction   Pre-operative cardiovascular examination    Time spent: 25 min    [place] [facility] Pager [phone] If 7PM-7AM, please contact night-coverage at [email or web address], password [facility] 05/02/2015, 4:24 PM  LOS: 1 day

## 2015-05-02 NOTE — Progress Notes (Signed)
Pharmacy Antibiotic Note  Elizabeth Mcconnell is a 70 y.o. female admitted on 05/01/2015 with intra-abdominal infection.  Pharmacy has been consulted for Zosyn dosing.  Plan: Zosyn 3.375g IV q8h (4 hour infusion).  Height: 5\' 5"  (165.1 cm) Weight: 142 lb 14.4 oz (64.819 kg) IBW/kg (Calculated) : 57  Temp (24hrs), Avg:97.9 F (36.6 C), Min:97.7 F (36.5 C), Max:98.1 F (36.7 C)   Recent Labs Lab 04/28/15 1641 05/01/15 1915 05/02/15 0436  WBC 9.1 12.2* 9.6  CREATININE 0.74 0.94 0.69    Estimated Creatinine Clearance: 58 mL/min (by C-G formula based on Cr of 0.69).    Allergies  Allergen Reactions  . Tramadol Other (See Comments)    Severe dizziness, weakness  . Amoxicillin-Pot Clavulanate Nausea And Vomiting  . Flagyl [Metronidazole] Nausea And Vomiting  . Flecainide     dizziness  . Omnicef [Cefdinir] Nausea And Vomiting  . Avelox [Moxifloxacin] Hives, Itching and Rash    Antimicrobials this admission: 2/28 zoysn >>    Dose adjustments this admission:   Microbiology results:  BCx:   UCx:    Sputum:    MRSA PCR:   Thank you for allowing pharmacy to be a part of this patient's care.  Dorrene German 05/02/2015 5:54 AM

## 2015-05-02 NOTE — Progress Notes (Signed)
Initial Nutrition Assessment  DOCUMENTATION CODES:   Not applicable  INTERVENTION:  - Will order Boost Breeze BID, each supplement provides 250 kcal and 9 grams of protein - Diet advancement as medically feasible - RD will continue to monitor for needs  NUTRITION DIAGNOSIS:   Inadequate oral intake related to acute illness, nausea, poor appetite as evidenced by per patient/family report.  GOAL:   Patient will meet greater than or equal to 90% of their needs  MONITOR:   PO intake, Diet advancement, Weight trends, Labs, I & O's  REASON FOR ASSESSMENT:   Malnutrition Screening Tool  ASSESSMENT:   70 y.o. female with history of persistent atrial fibrillation, atrial flutter status post ablation, asthma was referred to the ER after patient's CAT scan showed colonic obstruction. Patient has been experiencing abdominal pain off and on for last 1 year. Patient has been taking antibiotics off and on. Last 2 weeks patient's abdominal pain has worsened. Patient did take a course of antibiotics for sinusitis recently. Denies any vomiting but does have some nausea denies any diarrhea. Patient did have a bowel movement yesterday. Patient had recently followed up with her gastroenterologist 4 days ago and had a CAT scan done which shows distal colonic obstruction. Patient was referred to the ER. Patient still has some pain around the left quadrant. Abdomen on exam is nondistended and not tender.  Pt seen for MST. BMI indicates normal weight. Pt reports she tolerated breakfast on CLD with no N/V or abdominal pain but she has needed to urinate several times and did have diarrhea after intakes. Pt had received lunch tray as RD was entering room so discussion was slightly brief so that broth did not cool and popsicle did not melt.   Notes reviewed from occurences of nausea, vomiting, diarrhea over the past few days. Pt states that she has had these issues x2 weeks but they were exacerbated by taking  Moviprep on Thursday or Friday of last week and that she has not had vomiting since Friday (2/24). Pt reports since 2/23 she has only been consuming liquids, soup, and had oatmeal once that she tolerated well; no other solid foods since that time.   Unable to perform physical assessment or talk about weight trends PTA. Per chart review, pt's weight has been stable (142-147) x3 months with no weight hx available previous to that time.   Pt unable to meet needs on current diet order and was not meeting needs PTA. Will order Boost Breeze while on CLD and adjust as needed. Medications reviewed. Labs reviewed; K: 3.3 mmol/L, Ca: 8.4 mg/dL.   Diet Order:  Diet clear liquid Room service appropriate?: Yes; Fluid consistency:: Thin Diet NPO time specified Except for: Sips with Meds  Skin:  Reviewed, no issues  Last BM:  2/27  Height:   Ht Readings from Last 1 Encounters:  05/02/15 5\' 5"  (1.651 m)    Weight:   Wt Readings from Last 1 Encounters:  05/02/15 142 lb 14.4 oz (64.819 kg)    Ideal Body Weight:  56.82 kg (kg)  BMI:  Body mass index is 23.78 kg/(m^2).  Estimated Nutritional Needs:   Kcal:  1400-1600  Protein:  50-60 grams  Fluid:  2 L/day  EDUCATION NEEDS:   No education needs identified at this time     Jarome Matin, RD, LDN Inpatient Clinical Dietitian Pager # [phone] After hours/weekend pager # [phone]

## 2015-05-02 NOTE — Progress Notes (Signed)
PROGRESS - HEPARIN (brief note)  On IV heparin for bridging coverage (Eliquis on hold) for upcoming surgery on 3/2.  IV heparin infusing @ 900 units/hr  APTT = 52 sec (Goal 66-102 sec) HL = 0.97 (remains falsely elevated due to prior Eliquis)  No complications of therapy noted  Plan:  Increase IV heparin to 1050 units/hr            Check aPTT and HL 8 hr after rate increase  Leone Haven, PharmD

## 2015-05-02 NOTE — Care Management Note (Signed)
Case Management Note  Patient Details  Name: Elizabeth Mcconnell MRN: [id number] Date of Birth: 1945/04/13  Subjective/Objective:  70 y/o f admitted w/abd pain-Colonic obstruction, afib w/rvr. From home.sx cons-lap colectomy Thurs-on hep gtt currently.                  Action/Plan:d/c plan home.   Expected Discharge Date:   (unknown)               Expected Discharge Plan:  Home/Self Care  In-House Referral:     Discharge planning Services  CM Consult  Post Acute Care Choice:    Choice offered to:     DME Arranged:    DME Agency:     HH Arranged:    HH Agency:     Status of Service:  In process, will continue to follow  Medicare Important Message Given:    Date Medicare IM Given:    Medicare IM give by:    Date Additional Medicare IM Given:    Additional Medicare Important Message give by:     If discussed at [place] of Stay Meetings, dates discussed:    Additional Comments:  Dessa Phi, RN 05/02/2015, 1:41 PM

## 2015-05-03 LAB — HEPARIN LEVEL (UNFRACTIONATED)
Heparin Unfractionated: 0.73 IU/mL — ABNORMAL HIGH (ref 0.30–0.70)
Heparin Unfractionated: 0.83 IU/mL — ABNORMAL HIGH (ref 0.30–0.70)
Heparin Unfractionated: 0.46 IU/mL (ref 0.30–0.70)

## 2015-05-03 LAB — BASIC METABOLIC PANEL
Anion gap: 8 (ref 5–15)
BUN: 5 mg/dL — ABNORMAL LOW (ref 6–20)
CO2: 19 mmol/L — ABNORMAL LOW (ref 22–32)
Calcium: 8.2 mg/dL — ABNORMAL LOW (ref 8.9–10.3)
Chloride: 113 mmol/L — ABNORMAL HIGH (ref 101–111)
Creatinine, Ser: 0.71 mg/dL (ref 0.44–1.00)
GFR calc Af Amer: >60 mL/min (ref >60)
GFR calc non Af Amer: >60 mL/min (ref >60)
Glucose, Bld: 98 mg/dL (ref 65–99)
Potassium: 3.4 mmol/L — ABNORMAL LOW (ref 3.5–5.1)
Sodium: 140 mmol/L (ref 135–145)

## 2015-05-03 LAB — CBC
HCT: 35.3 % — ABNORMAL LOW (ref 36.0–46.0)
Hemoglobin: 11.4 g/dL — ABNORMAL LOW (ref 12.0–15.0)
MCH: 29.7 pg (ref 26.0–34.0)
MCHC: 32.3 g/dL (ref 30.0–36.0)
MCV: 91.9 fL (ref 78.0–100.0)
Platelets: 220 10*3/uL (ref 150–400)
RBC: 3.84 MIL/uL — ABNORMAL LOW (ref 3.87–5.11)
RDW: 13.7 % (ref 11.5–15.5)
WBC: 8.7 10*3/uL (ref 4.0–10.5)

## 2015-05-03 LAB — GLUCOSE, CAPILLARY
Glucose-Capillary: 134 mg/dL — ABNORMAL HIGH (ref 65–99)
Glucose-Capillary: 138 mg/dL — ABNORMAL HIGH (ref 65–99)
Glucose-Capillary: 85 mg/dL (ref 65–99)
Glucose-Capillary: 92 mg/dL (ref 65–99)

## 2015-05-03 LAB — MAGNESIUM: Magnesium: 1.8 mg/dL (ref 1.7–2.4)

## 2015-05-03 LAB — APTT
aPTT: 85 seconds — ABNORMAL HIGH (ref 24–37)
aPTT: 93 seconds — ABNORMAL HIGH (ref 24–37)
aPTT: 69 seconds — ABNORMAL HIGH (ref 24–37)

## 2015-05-03 MED ORDER — POTASSIUM CHLORIDE CRYS ER 20 MEQ PO TBCR: 40.0000 meq | EXTENDED_RELEASE_TABLET | ORAL | Status: DC

## 2015-05-03 MED ORDER — POTASSIUM CHLORIDE IN NACL 20-0.9 MEQ/L-% IV SOLN
INTRAVENOUS | Status: AC
  Administered 2015-05-03 – 2015-05-04 (×3): via INTRAVENOUS
  Filled 2015-05-03 (×4): qty 1000

## 2015-05-03 MED ORDER — DOFETILIDE 250 MCG PO CAPS
250.0000 ug | ORAL_CAPSULE | Freq: Two times a day (BID) | ORAL | Status: DC
  Administered 2015-05-03: 250 ug via ORAL
  Filled 2015-05-03 (×3): qty 1

## 2015-05-03 MED ORDER — MAGNESIUM SULFATE 2 GM/50ML IV SOLN
2.0000 g | Freq: Once | INTRAVENOUS | Status: AC
  Administered 2015-05-03: 2 g via INTRAVENOUS
  Filled 2015-05-03: qty 50

## 2015-05-03 MED ORDER — MAGNESIUM SULFATE 2 GM/50ML IV SOLN: 2.0000 g | Freq: Once | INTRAVENOUS | Status: DC

## 2015-05-03 MED ORDER — POTASSIUM CHLORIDE 10 MEQ/100ML IV SOLN
10.0000 meq | INTRAVENOUS | Status: AC
  Administered 2015-05-03 (×3): 10 meq via INTRAVENOUS
  Filled 2015-05-03 (×2): qty 100

## 2015-05-03 NOTE — Progress Notes (Signed)
PROGRESS - HEPARIN (brief note)  On IV heparin for bridging coverage (Eliquis on hold) for upcoming surgery on 3/2.  IV heparin infusing @ 1050 units/hr  APTT = 85 sec (Goal 66-102 sec) HL = 0.73 (starting to correlate with aPtt- Eliquis effect is diminishing)  No complications of therapy noted  Plan: Continue  IV heparin at 1050 units/hr            Check aPTT and HL at 10 am today to ensure still therapeutic  Thanks  Dorrene German 05/03/2015 2:57 AM

## 2015-05-03 NOTE — Progress Notes (Signed)
Pharmacy - heparin dosing  Assessment:    Please see note from Gretta Arab, PharmD earlier today for full details.  Briefly, 70 y.o. female on heparin for bridging from Eliquis periprocedurally   HL = therapeutic at 0.46  APTT = therapeutic at 37; borderline low  No bleeding or line issues per RN  Plan:   Continue IV heparin infusion at 1050 units/hr.  Heparin to stop at 0400 tomorrow AM; verbally confirmed with RN  Reuel Boom, PharmD, BCPS Pager: [phone] 05/03/2015, 8:16 PM

## 2015-05-03 NOTE — Progress Notes (Addendum)
[facility] for IV Heparin Indication: atrial fibrillation (PTA Eliquis held)  Allergies  Allergen Reactions  . Tramadol Other (See Comments)    Severe dizziness, weakness  . Amoxicillin-Pot Clavulanate Nausea And Vomiting  . Flagyl [Metronidazole] Nausea And Vomiting  . Flecainide     dizziness  . Omnicef [Cefdinir] Nausea And Vomiting  . Avelox [Moxifloxacin] Hives, Itching and Rash    Patient Measurements: Height: 5\' 5"  (165.1 cm) Weight: 142 lb 10.2 oz (64.7 kg) IBW/kg (Calculated) : 57 Heparin Dosing Weight: 57 kg  Vital Signs: Temp: 97.7 F (36.5 C) (03/01 0553) Temp Source: Oral (03/01 0553) BP: 111/59 mmHg (03/01 0553) Pulse Rate: 62 (03/01 0553)  Labs:  Recent Labs  05/01/15 1915  05/01/15 2358 05/02/15 0436 05/02/15 0747 05/02/15 1704 05/02/15 1705 05/03/15 0204  HGB 15.0  --   --  11.6*  --   --   --   --   HCT 46.3*  --   --  35.1*  --   --   --   --   PLT 290  --   --  216  --   --   --   --   APTT  --   < > 33  --  58* 52*  --  85*  LABPROT  --   --  15.8*  --   --   --   --   --   INR  --   --  1.30  --   --   --   --   --   HEPARINUNFRC  --   < > 1.90*  --  1.30*  --  0.97* 0.73*  CREATININE 0.94  --   --  0.69  --   --   --  0.71  < > = values in this interval not displayed.  Estimated Creatinine Clearance: 58 mL/min (by C-G formula based on Cr of 0.71).   Infusions:  . 0.9 % NaCl with KCl 20 mEq / L 50 mL/hr at 05/03/15 1001  . heparin 1,050 Units/hr (05/03/15 0025)    Assessment: 34 yoF presented to ED on 2/27 with c/o abdominal pain and constipation; outpatient CT scan showed colonic obstruction.  PMH includes Eliquis anticoagulation for A-fib which is now held in anticipation of procedure; GI and surgery are consulted.  Pharmacy is consulted to dose IV heparin.   Last reported home dose of Eliquis 5mg  was 2/27 10am   Today, 05/03/2015:  APTT 93, therapeutic at high end of range.  HL 0.83  (supratherapeutic, but expected to remain elevated d/t Eliquis interaction)  CBC:  Last on 2/28, Hgb decreased to 11.6, Plt remain WNL  No bleeding or complications reported.   Goal of Therapy:  Heparin level 0.3-0.7 units/ml aPTT 66-102 seconds Monitor platelets by anticoagulation protocol: Yes   Plan:   Continue heparin IV infusion at 1050 units/hr  Re-check aPTT and HL tonight to ensure therapeutic level since aPTT is therapeutic and increasing and HL is supratherapeutic and increasing.  Will use aPTT to titrate heparin until HL and aPtt correlate and effects of Eliquis on HL are diminished.  Daily heparin level, APTT, and CBC  Surgery is planned for 05/04/15 and Heparin will be d/c 6 hours preop on 05/04/15 at 0400.  Surgery confirms plan to stop Heparin in the AM and still give pre-op Lovenox 40mg  SQ.   Gretta Arab PharmD, BCPS Pager [phone] 05/03/2015 10:44 AM

## 2015-05-03 NOTE — Progress Notes (Signed)
[facility] Progress Note     Subjective: Pt feels good, much less pain, no N/V.  Having lots of watery diarrhea, not all the way clear yet.  Ambulating well.  Less distended.  IS up to 2000.  Objective: Vital signs in last 24 hours: Temp:  [97.5 F (36.4 C)-97.8 F (36.6 C)] 97.7 F (36.5 C) (03/01 0553) Pulse Rate:  [62-78] 62 (03/01 0553) Resp:  [18] 18 (03/01 0553) BP: (111-120)/(55-61) 111/59 mmHg (03/01 0553) SpO2:  [97 %-100 %] 98 % (03/01 0819) Weight:  [64.7 kg (142 lb 10.2 oz)] 64.7 kg (142 lb 10.2 oz) (03/01 0553) Last BM Date: 05/02/15  Intake/Output from previous day: 02/28 0701 - 03/01 0700 In: 961.7 [P.O.:360; I.V.:551.7; IV Piggyback:50] Out: 2600 [Urine:2600] Intake/Output this shift:    PE: Gen:  Alert, NAD, pleasant Card:  RRR, no M/G/R heard Pulm:  CTA, no W/R/R Abd: Soft, mild distension, much less tender, +BS, no HSM, abdominal scars noted   Lab Results:   Recent Labs  05/01/15 1915 05/02/15 0436  WBC 12.2* 9.6  HGB 15.0 11.6*  HCT 46.3* 35.1*  PLT 290 216   BMET  Recent Labs  05/02/15 0436 05/03/15 0204  NA 141 140  K 3.3* 3.4*  CL 110 113*  CO2 20* 19*  GLUCOSE 78 98  BUN 9 5*  CREATININE 0.69 0.71  CALCIUM 8.4* 8.2*   PT/INR  Recent Labs  05/01/15 2358  LABPROT 15.8*  INR 1.30   CMP     Component Value Date/Time   NA 140 05/03/2015 0204   K 3.4* 05/03/2015 0204   CL 113* 05/03/2015 0204   CO2 19* 05/03/2015 0204   GLUCOSE 98 05/03/2015 0204   BUN 5* 05/03/2015 0204   CREATININE 0.71 05/03/2015 0204   CREATININE 0.80 02/22/2015 1527   CALCIUM 8.2* 05/03/2015 0204   PROT 5.6* 05/02/2015 0436   ALBUMIN 3.4* 05/02/2015 0436   AST 23 05/02/2015 0436   ALT 20 05/02/2015 0436   ALKPHOS 54 05/02/2015 0436   BILITOT 1.1 05/02/2015 0436   GFRNONAA >60 05/03/2015 0204   GFRAA >60 05/03/2015 0204   Lipase     Component Value Date/Time   LIPASE 34 05/01/2015 1915       Studies/Results: Ct  Abdomen Pelvis W Contrast  05/01/2015  CLINICAL DATA:  Severe diffuse abdominal pain and constipation for 2 weeks. EXAM: CT ABDOMEN AND PELVIS WITH CONTRAST TECHNIQUE: Multidetector CT imaging of the abdomen and pelvis was performed using the standard protocol following bolus administration of intravenous contrast. CONTRAST:  143mL OMNIPAQUE IOHEXOL 300 MG/ML  SOLN COMPARISON:  12/10/2013 FINDINGS: Lower chest:  No acute findings. Hepatobiliary: No masses or other significant abnormality. Gallbladder is unremarkable. Pancreas: No mass, inflammatory changes, or other significant abnormality. Spleen: Within normal limits in size and appearance. Adrenals/Urinary Tract: No masses identified. No evidence of hydronephrosis. Duplicated left renal collecting system incidentally noted. Stomach/Bowel: No evidence of small bowel dilatation. Diffuse colonic dilatation is seen with multiple air-fluid levels. Diverticulosis is seen involving the sigmoid colon, without definite acute pericolonic inflammatory changes. There is a relatively short segment of wall thickening seen involving the mid sigmoid colon on image 60/series 2. Although this could be due to mild diverticulitis or muscular hypertrophy, obstructing colon carcinoma cannot definitely be excluded. Vascular/Lymphatic: No pathologically enlarged lymph nodes. No evidence of abdominal aortic aneurysm. Reproductive: Prior hysterectomy noted. Adnexal regions are unremarkable in appearance. A Other: None. Musculoskeletal:  No suspicious bone lesions identified. IMPRESSION: Findings  consistent with distal colonic obstruction in the mid sigmoid colon. There is evidence of diverticulosis in this region CT on but there is also a short segment area of concentric wall thickening without acute pericolonic inflammatory changes. Differential diagnosis includes mild diverticulitis, muscular hypertrophy, and an obstructing colon carcinoma cannot definitely be excluded. Recommend  clinical correlation, and consider sigmoidoscopy for further evaluation. No evidence of abscess or metastatic disease within the abdomen or pelvis. Electronically Signed   By: Earle Gell M.D.   On: 05/01/2015 16:40    Anti-infectives: Anti-infectives    Start     Dose/Rate Route Frequency Ordered Stop   05/04/15 0600  cefoTEtan in Dextrose 5% (CEFOTAN) 2 g IVPB     2 g over 30 Minutes Intravenous On call to O.R. 05/02/15 1542 05/05/15 0559   05/03/15 1300  neomycin (MYCIFRADIN) tablet 1,000 mg     1,000 mg Oral 3 times per day on Wed 05/02/15 1609 05/10/15 1259   05/02/15 1615  neomycin (MYCIFRADIN) tablet 1,000 mg  Status:  Discontinued     1,000 mg Oral 3 times per day on Tue 05/02/15 1556 05/02/15 1609   05/02/15 0600  piperacillin-tazobactam (ZOSYN) IVPB 3.375 g     3.375 g 12.5 mL/hr over 240 Minutes Intravenous Every 8 hours 05/02/15 0553         Assessment/Plan Colonic obstruction/stricture ?diverticulitis, mass? -Slow prep over the next few days, miralax BID, fleet enema, continue until clear -Continue clears as tolerated, NPO MN for surgery tomorrow -Ambulate and IS -SCD's and heparin infusion, will need to be held 6 hours before OR (4am), but pre-op lovenox is on colon protocol so will continue with that -OR on Thursday for laparoscopic colectomy with possible ostomy -Continue Zosyn Day #3  AFIB with RVR, A flutter -Eliquis on hold -Appreciate cards recs - will resume Tikosyn ASAP post-operatively, keeping potassium >4, mg >2 Asthma H/o spontaneous pneumothorax s/p LLL resection HTN Tobacco abuse    LOS: 2 days    Nat Christen 05/03/2015, 9:00 AM Pager: [phone]  (7am - 4:30pm M-F; 7am - 11:30am Sa/Su)

## 2015-05-03 NOTE — Progress Notes (Signed)
[facility] PROGRESS NOTE  Elizabeth Mcconnell [id number] DOB: [birth year] DOA: 05/01/2015 PCP: [name], MD  Summary Appreciate cardiology/general surgery. I have seen and examined Elizabeth Mcconnell at bedside and reviewed her chart. Elizabeth Mcconnell is a 70 y.o. female with history of persistent atrial fibrillation, atrial flutter status post ablation now on Tikosyn, bronchial asthma who was referred to the ER after patient's CAT scan showed colonic obstruction(?related to end stage diverticulitis) and she is to have laparoscopic colectomy with possible ostomy tomorrow. Her electrolytes are off, including hypokalemia/hypomagnesemia and these were replenished and will need to be closely monitored. Patient is on IV heparin and off Eliquis perioperatively. She denies any complaints. Plan Colonic obstruction (HCC)/ Diverticulosis of colon without hemorrhage/Abdominal pain, left lower quadrant/Stricture of sigmoid colon with obstruction  Defer management to general surgery  For colectomy tomorrow Atrial fibrillation with RVR (HCC)/Pre-operative cardiovascular examination/hypokalemia/hypomagnesemia  Appreciate cardiology  Continue Tikosyn  Replenish potassium/magnesium  Off Eliquis, heparin per pharmacy perioperatively Asthma, moderate persistent  No evidence of flare up  Monitor   Code Status: Full Code Family Communication: Spoke with patient Disposition Plan: Probably home next week   Consultants:  Cardiology  General surgery  Procedures:    Antibiotics:    HPI/Subjective: Feels okay, no complaints.  Objective: Filed Vitals:   05/03/15 0553 05/03/15 1357  BP: 111/59 120/65  Pulse: 62 64  Temp: 97.7 F (36.5 C) 97.4 F (36.3 C)  Resp: 18 18    Intake/Output Summary (Last 24 hours) at 05/03/15 2027 Last data filed at 05/03/15 1800  Gross per 24 hour  Intake 449.17 ml  Output   1200 ml  Net -750.83 ml   Filed Weights   05/02/15 0109  05/03/15 0553  Weight: 64.819 kg (142 lb 14.4 oz) 64.7 kg (142 lb 10.2 oz)    Exam:   General:  Comfortable at rest.  Cardiovascular: S1-S2 normal. No murmurs. Pulse regular.  Respiratory: Good air entry bilaterally. No rhonchi or rales.  Abdomen: Soft and nontender. Normal bowel sounds. No organomegaly.  Musculoskeletal: No pedal edema   Neurological: Intact  Data Reviewed: Basic Metabolic Panel:  Recent Labs Lab 04/28/15 1641 05/01/15 1915 05/02/15 0436 05/03/15 0204  NA 137 136 141 140  K 3.8 3.1* 3.3* 3.4*  CL 103 101 110 113*  CO2 27 21* 20* 19*  GLUCOSE 89 103* 78 98  BUN 11 10 9  5*  CREATININE 0.74 0.94 0.69 0.71  CALCIUM 9.3 9.1 8.4* 8.2*  MG  --   --   --  1.8   Liver Function Tests:  Recent Labs Lab 04/28/15 1641 05/01/15 1915 05/02/15 0436  AST 13 33 23  ALT 9 27 20   ALKPHOS 86 76 54  BILITOT 0.5 1.0 1.1  PROT 7.4 7.3 5.6*  ALBUMIN 4.3 4.4 3.4*    Recent Labs Lab 05/01/15 1915  LIPASE 34   No results for input(s): AMMONIA in the last 168 hours. CBC:  Recent Labs Lab 04/28/15 1641 05/01/15 1915 05/02/15 0436  WBC 9.1 12.2* 9.6  NEUTROABS 6.2  --  6.0  HGB 13.5 15.0 11.6*  HCT 40.2 46.3* 35.1*  MCV 87.7 91.5 90.5  PLT 265.0 290 216   Cardiac Enzymes: No results for input(s): CKTOTAL, CKMB, CKMBINDEX, TROPONINI in the last 168 hours. BNP (last 3 results) No results for input(s): BNP in the last 8760 hours.  ProBNP (last 3 results) No results for input(s): PROBNP in the last 8760 hours.  CBG:  Recent Labs Lab  05/02/15 1808 05/02/15 2259 05/03/15 0549 05/03/15 1353 05/03/15 1729  GLUCAP 89 92 85 138* 134*    No results found for this or any previous visit (from the past 240 hour(s)).   Studies: No results found.  Scheduled Meds: . [START ON 05/04/2015] alvimopan  12 mg Oral Once  . [START ON 05/04/2015] cefoTEtan (CEFOTAN) 2 GM IVPB  2 g Intravenous On Call to OR  . chlorhexidine  60 mL Topical Once   And  .  [START ON 05/04/2015] chlorhexidine  60 mL Topical Once  . diltiazem  300 mg Oral QHS  . dofetilide  250 mcg Oral BID  . [START ON 05/04/2015] enoxaparin (LOVENOX) injection  40 mg Subcutaneous Once  . feeding supplement  1 Container Oral BID BM  . fluticasone  2 spray Each Nare Daily  . Linaclotide  145 mcg Oral Daily  . magnesium oxide  400 mg Oral Daily  . mometasone-formoterol  2 puff Inhalation BID  . montelukast  10 mg Oral Daily  . neomycin  1,000 mg Oral 3 times per day on Wed  . piperacillin-tazobactam (ZOSYN)  IV  3.375 g Intravenous Q8H  . polyethylene glycol  17 g Oral BID  . saccharomyces boulardii  250 mg Oral BID   Continuous Infusions: . 0.9 % NaCl with KCl 20 mEq / L 50 mL/hr at 05/03/15 1001  . heparin 1,050 Units/hr (05/03/15 2020)     Time spent: 25 minutes    Elizabeth Mcconnell  [facility] Pager [phone]. If 7PM-7AM, please contact night-coverage at [email or web address], password [facility] 05/03/2015, 8:27 PM  LOS: 2 days

## 2015-05-03 NOTE — Progress Notes (Addendum)
PT Profile:  70 year old female with hx PAF and a flutter with a flutter ablation in 2014. Intolerant to flecainide.  Now on tikosyn but if she misses dose she has braek through afib. And on Eliquis . Now admitted with colonic obstruction and plans for surgery 05/04/15.  Subjective: Maintaining SR no complaints  Objective: Vital signs in last 24 hours: Temp:  [97.5 F (36.4 C)-97.8 F (36.6 C)] 97.7 F (36.5 C) (03/01 0553) Pulse Rate:  [62-78] 62 (03/01 0553) Resp:  [18] 18 (03/01 0553) BP: (111-120)/(55-61) 111/59 mmHg (03/01 0553) SpO2:  [97 %-100 %] 98 % (03/01 0819) Weight:  [142 lb 10.2 oz (64.7 kg)] 142 lb 10.2 oz (64.7 kg) (03/01 0553) Weight change: -4.2 oz (-0.119 kg) Last BM Date: 05/02/15 Intake/Output from previous day:  -1136 02/28 0701 - 03/01 0700 In: 961.7 [P.O.:360; I.V.:551.7; IV Piggyback:50] Out: 2600 [Urine:2600] Intake/Output this shift:    PE: General:Pleasant affect, NAD Skin:Warm and dry, brisk capillary refill HEENT:normocephalic, sclera clear, mucus membranes moist Neck:supple, no JVD, no bruits  Heart:S1S2 RRR without murmur, gallup, rub or click Lungs:clear without rales, rhonchi, or wheezes [id number], non tender, + BS, do not palpate liver spleen or masses Ext:no lower ext edema Neuro:alert and oriented X 3, MAE, follows commands, + facial symmetry Tele: SR    Lab Results:  Recent Labs  05/01/15 1915 05/02/15 0436  WBC 12.2* 9.6  HGB 15.0 11.6*  HCT 46.3* 35.1*  PLT 290 216   BMET  Recent Labs  05/02/15 0436 05/03/15 0204  NA 141 140  K 3.3* 3.4*  CL 110 113*  CO2 20* 19*  GLUCOSE 78 98  BUN 9 5*  CREATININE 0.69 0.71  CALCIUM 8.4* 8.2*   No results for input(s): TROPONINI in the last 72 hours.  Invalid input(s): CK, MB  Lab Results  Component Value Date   CHOL 247* 06/13/2014   HDL 70.30 06/13/2014   LDLCALC 148* 06/13/2014   TRIG 146.0 06/13/2014   CHOLHDL 4 06/13/2014   No results found for: HGBA1C     Lab Results  Component Value Date   TSH 2.81 06/06/2014    Hepatic Function Panel  Recent Labs  05/02/15 0436  PROT 5.6*  ALBUMIN 3.4*  AST 23  ALT 20  ALKPHOS 54  BILITOT 1.1   No results for input(s): CHOL in the last 72 hours. No results for input(s): PROTIME in the last 72 hours.     Studies/Results: Ct Abdomen Pelvis W Contrast  05/01/2015  CLINICAL DATA:  Severe diffuse abdominal pain and constipation for 2 weeks. EXAM: CT ABDOMEN AND PELVIS WITH CONTRAST TECHNIQUE: Multidetector CT imaging of the abdomen and pelvis was performed using the standard protocol following bolus administration of intravenous contrast. CONTRAST:  138mL OMNIPAQUE IOHEXOL 300 MG/ML  SOLN COMPARISON:  12/10/2013 FINDINGS: Lower chest:  No acute findings. Hepatobiliary: No masses or other significant abnormality. Gallbladder is unremarkable. Pancreas: No mass, inflammatory changes, or other significant abnormality. Spleen: Within normal limits in size and appearance. Adrenals/Urinary Tract: No masses identified. No evidence of hydronephrosis. Duplicated left renal collecting system incidentally noted. Stomach/Bowel: No evidence of small bowel dilatation. Diffuse colonic dilatation is seen with multiple air-fluid levels. Diverticulosis is seen involving the sigmoid colon, without definite acute pericolonic inflammatory changes. There is a relatively short segment of wall thickening seen involving the mid sigmoid colon on image 60/series 2. Although this could be due to mild diverticulitis or muscular hypertrophy, obstructing colon  carcinoma cannot definitely be excluded. Vascular/Lymphatic: No pathologically enlarged lymph nodes. No evidence of abdominal aortic aneurysm. Reproductive: Prior hysterectomy noted. Adnexal regions are unremarkable in appearance. A Other: None. Musculoskeletal:  No suspicious bone lesions identified. IMPRESSION: Findings consistent with distal colonic obstruction in the mid sigmoid  colon. There is evidence of diverticulosis in this region CT on but there is also a short segment area of concentric wall thickening without acute pericolonic inflammatory changes. Differential diagnosis includes mild diverticulitis, muscular hypertrophy, and an obstructing colon carcinoma cannot definitely be excluded. Recommend clinical correlation, and consider sigmoidoscopy for further evaluation. No evidence of abscess or metastatic disease within the abdomen or pelvis. Electronically Signed   By: Earle Gell M.D.   On: 05/01/2015 16:40    Medications: I have reviewed the patient's current medications. Scheduled Meds: . [START ON 05/04/2015] alvimopan  12 mg Oral Once  . [START ON 05/04/2015] cefoTEtan (CEFOTAN) 2 GM IVPB  2 g Intravenous On Call to OR  . chlorhexidine  60 mL Topical Once   And  . [START ON 05/04/2015] chlorhexidine  60 mL Topical Once  . diltiazem  300 mg Oral QHS  . dofetilide  250 mcg Oral BID  . [START ON 05/04/2015] enoxaparin (LOVENOX) injection  40 mg Subcutaneous Once  . feeding supplement  1 Container Oral BID BM  . fluticasone  2 spray Each Nare Daily  . Linaclotide  145 mcg Oral Daily  . magnesium oxide  400 mg Oral Daily  . magnesium sulfate 1 - 4 g bolus IVPB  2 g Intravenous Once  . mometasone-formoterol  2 puff Inhalation BID  . montelukast  10 mg Oral Daily  . neomycin  1,000 mg Oral 3 times per day on Wed  . piperacillin-tazobactam (ZOSYN)  IV  3.375 g Intravenous Q8H  . polyethylene glycol  17 g Oral BID  . potassium chloride  10 mEq Intravenous Q1 Hr x 3  . saccharomyces boulardii  250 mg Oral BID   Continuous Infusions: . 0.9 % NaCl with KCl 20 mEq / L 50 mL/hr at 05/03/15 1001  . heparin 1,050 Units/hr (05/03/15 0025)   PRN Meds:.acetaminophen **OR** acetaminophen, morphine injection, ondansetron **OR** ondansetron (ZOFRAN) IV  Assessment/Plan: Principal Problem:   Colonic obstruction (HCC) Active Problems:   Asthma, moderate persistent    Diverticulosis of colon without hemorrhage   Atrial fibrillation with RVR (HCC)   Abdominal pain, left lower quadrant   Stricture of sigmoid colon with obstruction   Pre-operative cardiovascular examination  1. PAF will be off Tikosyn for surgery and post op- Per Dr. [name] "Of course if she is nothing by mouth, no medication, we would advise utilizing IV diltiazem to help control potential atrial fibrillation heart rate. Resume Tikosyn AS SOON AS POSSIBLE (even via NGT if necessary) with telemetry monitoring. Keep potassium greater than 4, magnesium greater than 2. Will need ECG's post reinitiation to ensure normal QT interval. Optimal 5 doses monitored. Discussed with Dr. Rayann Heman."  2. Chronic anticoagulation off eliquis on IV heparin   3. Preoperative risk stratification - Prior to this occurrence, she was able to complete greater than 4 METS of activity without difficulty with no anginal symptoms. No prior history of CAD. Normal ejection fraction. She may proceed with abdominal surgery from a cardiac standpoint with low overall risk. Main issue will be paroxysmal atrial fibrillation. Currently RRR, CTAB.   4. Hypo mg+ and hypokalemia replacing.     LOS: 2 days   Time spent with  pt. :15 minutes. [facility]  Nurse Practitioner Certified Pager [phone] or after 5pm and on weekends call [phone] 05/03/2015, 10:27 AM   Personally seen and examined. Agree with above. PAF plan as above. IV dilt if needed Tikosyn as soon as possible  Candee Furbish, MD

## 2015-05-04 ENCOUNTER — Encounter (HOSPITAL_COMMUNITY): Payer: Self-pay | Admitting: Anesthesiology

## 2015-05-04 ENCOUNTER — Inpatient Hospital Stay (HOSPITAL_COMMUNITY): Payer: Medicare Other | Admitting: Anesthesiology

## 2015-05-04 ENCOUNTER — Encounter (HOSPITAL_COMMUNITY)
Admission: EM | Disposition: A | Discharge status: Home / Self Care | Payer: Self-pay | Source: Home / Self Care | Attending: Internal Medicine

## 2015-05-04 HISTORY — PX: LAPAROSCOPIC PARTIAL COLECTOMY: SHX5907

## 2015-05-04 LAB — COMPREHENSIVE METABOLIC PANEL
ALT: 31 U/L (ref 14–54)
AST: 30 U/L (ref 15–41)
Albumin: 3.5 g/dL (ref 3.5–5.0)
Alkaline Phosphatase: 54 U/L (ref 38–126)
Anion gap: 7 (ref 5–15)
BUN: <5 mg/dL — ABNORMAL LOW (ref 6–20)
CO2: 20 mmol/L — ABNORMAL LOW (ref 22–32)
Calcium: 8.2 mg/dL — ABNORMAL LOW (ref 8.9–10.3)
Chloride: 116 mmol/L — ABNORMAL HIGH (ref 101–111)
Creatinine, Ser: 0.74 mg/dL (ref 0.44–1.00)
GFR calc Af Amer: >60 mL/min (ref >60)
GFR calc non Af Amer: >60 mL/min (ref >60)
Glucose, Bld: 88 mg/dL (ref 65–99)
Potassium: 3.7 mmol/L (ref 3.5–5.1)
Sodium: 143 mmol/L (ref 135–145)
Total Bilirubin: 0.3 mg/dL (ref 0.3–1.2)
Total Protein: 5.8 g/dL — ABNORMAL LOW (ref 6.5–8.1)

## 2015-05-04 LAB — CBC
HCT: 32.3 % — ABNORMAL LOW (ref 36.0–46.0)
Hemoglobin: 10.6 g/dL — ABNORMAL LOW (ref 12.0–15.0)
MCH: 29.7 pg (ref 26.0–34.0)
MCHC: 32.8 g/dL (ref 30.0–36.0)
MCV: 90.5 fL (ref 78.0–100.0)
Platelets: 192 10*3/uL (ref 150–400)
RBC: 3.57 MIL/uL — ABNORMAL LOW (ref 3.87–5.11)
RDW: 13.5 % (ref 11.5–15.5)
WBC: 6.4 10*3/uL (ref 4.0–10.5)

## 2015-05-04 LAB — TYPE AND SCREEN
ABO/RH(D): A POS
Antibody Screen: NEGATIVE

## 2015-05-04 LAB — SURGICAL PCR SCREEN
MRSA, PCR: NEGATIVE
Staphylococcus aureus: NEGATIVE

## 2015-05-04 LAB — GLUCOSE, CAPILLARY
Glucose-Capillary: 90 mg/dL (ref 65–99)
Glucose-Capillary: 143 mg/dL — ABNORMAL HIGH (ref 65–99)

## 2015-05-04 LAB — ABO/RH: ABO/RH(D): A POS

## 2015-05-04 LAB — HEMOGLOBIN A1C
Hgb A1c MFr Bld: 5.5 % (ref 4.8–5.6)
Mean Plasma Glucose: 111 mg/dL

## 2015-05-04 LAB — HEPARIN LEVEL (UNFRACTIONATED): Heparin Unfractionated: 0.31 IU/mL (ref 0.30–0.70)

## 2015-05-04 LAB — MAGNESIUM: Magnesium: 2 mg/dL (ref 1.7–2.4)

## 2015-05-04 SURGERY — LAPAROSCOPIC PARTIAL COLECTOMY
Anesthesia: General | Site: Abdomen

## 2015-05-04 MED ORDER — CEFOTETAN DISODIUM 2 G IJ SOLR
2.0000 g | INTRAMUSCULAR | Status: DC | PRN
  Administered 2015-05-04: 2 g via INTRAVENOUS

## 2015-05-04 MED ORDER — MIDAZOLAM HCL 2 MG/2ML IJ SOLN
INTRAMUSCULAR | Status: AC
  Filled 2015-05-04: qty 2

## 2015-05-04 MED ORDER — SODIUM CHLORIDE 0.9 % IV SOLN
INTRAVENOUS | Status: DC
  Administered 2015-05-04 – 2015-05-05 (×3): via INTRAVENOUS

## 2015-05-04 MED ORDER — FENTANYL CITRATE (PF) 250 MCG/5ML IJ SOLN
INTRAMUSCULAR | Status: AC
  Filled 2015-05-04: qty 5

## 2015-05-04 MED ORDER — GLYCOPYRROLATE 0.2 MG/ML IJ SOLN
INTRAMUSCULAR | Status: DC | PRN
Start: 2015-05-04 — End: 2015-05-04
  Administered 2015-05-04: 0.2 mg via INTRAVENOUS

## 2015-05-04 MED ORDER — FENTANYL CITRATE (PF) 100 MCG/2ML IJ SOLN
INTRAMUSCULAR | Status: AC
  Filled 2015-05-04: qty 2

## 2015-05-04 MED ORDER — ACETAMINOPHEN 500 MG PO TABS
1000.0000 mg | ORAL_TABLET | Freq: Three times a day (TID) | ORAL | Status: DC
Start: 2015-05-04 — End: 2015-05-05
  Administered 2015-05-04: 1000 mg via ORAL
  Filled 2015-05-04 (×3): qty 2

## 2015-05-04 MED ORDER — LIDOCAINE HCL (CARDIAC) 20 MG/ML IV SOLN
INTRAVENOUS | Status: DC | PRN
Start: 2015-05-04 — End: 2015-05-04
  Administered 2015-05-04: 50 mg via INTRAVENOUS

## 2015-05-04 MED ORDER — PROPOFOL 10 MG/ML IV BOLUS
INTRAVENOUS | Status: DC | PRN
  Administered 2015-05-04: 150 mg via INTRAVENOUS

## 2015-05-04 MED ORDER — LACTATED RINGERS IR SOLN
Status: DC | PRN
  Administered 2015-05-04: 2000 mL

## 2015-05-04 MED ORDER — DEXTROSE 5 % IV SOLN
2.0000 g | Freq: Two times a day (BID) | INTRAVENOUS | Status: AC
  Administered 2015-05-04: 2 g via INTRAVENOUS
  Filled 2015-05-04: qty 2

## 2015-05-04 MED ORDER — BUPIVACAINE LIPOSOME 1.3 % IJ SUSP
20.0000 mL | Freq: Once | INTRAMUSCULAR | Status: AC
  Administered 2015-05-04: 20 mL
  Filled 2015-05-04: qty 20

## 2015-05-04 MED ORDER — SUGAMMADEX SODIUM 200 MG/2ML IV SOLN
INTRAVENOUS | Status: AC
  Filled 2015-05-04: qty 2

## 2015-05-04 MED ORDER — SODIUM CHLORIDE 0.9 % IV SOLN: Freq: Once | INTRAVENOUS | Status: DC

## 2015-05-04 MED ORDER — SUGAMMADEX SODIUM 200 MG/2ML IV SOLN
INTRAVENOUS | Status: DC | PRN
  Administered 2015-05-04: 200 mg via INTRAVENOUS

## 2015-05-04 MED ORDER — ALUM & MAG HYDROXIDE-SIMETH 200-200-20 MG/5ML PO SUSP: 30.0000 mL | Freq: Four times a day (QID) | ORAL | Status: DC | PRN

## 2015-05-04 MED ORDER — DIPHENHYDRAMINE HCL 12.5 MG/5ML PO ELIX
12.5000 mg | ORAL_SOLUTION | Freq: Four times a day (QID) | ORAL | Status: DC | PRN
Start: 2015-05-04 — End: 2015-05-09

## 2015-05-04 MED ORDER — LACTATED RINGERS IV BOLUS (SEPSIS): 1000.0000 mL | Freq: Three times a day (TID) | INTRAVENOUS | Status: AC | PRN

## 2015-05-04 MED ORDER — ACETAMINOPHEN 10 MG/ML IV SOLN
INTRAVENOUS | Status: AC
  Filled 2015-05-04: qty 100

## 2015-05-04 MED ORDER — HEPARIN (PORCINE) IN NACL 100-0.45 UNIT/ML-% IJ SOLN
1050.0000 [IU]/h | INTRAMUSCULAR | Status: DC
  Administered 2015-05-04: 1050 [IU]/h via INTRAVENOUS
  Filled 2015-05-04 (×2): qty 250

## 2015-05-04 MED ORDER — ROCURONIUM BROMIDE 100 MG/10ML IV SOLN
INTRAVENOUS | Status: AC
  Filled 2015-05-04: qty 2

## 2015-05-04 MED ORDER — BUPIVACAINE-EPINEPHRINE 0.25% -1:200000 IJ SOLN
INTRAMUSCULAR | Status: DC | PRN
  Administered 2015-05-04: 15 mL

## 2015-05-04 MED ORDER — EPHEDRINE SULFATE 50 MG/ML IJ SOLN
INTRAMUSCULAR | Status: DC | PRN
  Administered 2015-05-04: 5 mg via INTRAVENOUS
  Administered 2015-05-04: 10 mg via INTRAVENOUS
  Administered 2015-05-04: 5 mg via INTRAVENOUS
  Administered 2015-05-04: 10 mg via INTRAVENOUS

## 2015-05-04 MED ORDER — DIPHENHYDRAMINE HCL 50 MG/ML IJ SOLN: 12.5000 mg | Freq: Four times a day (QID) | INTRAMUSCULAR | Status: DC | PRN

## 2015-05-04 MED ORDER — ZOLPIDEM TARTRATE 5 MG PO TABS
5.0000 mg | ORAL_TABLET | Freq: Every evening | ORAL | Status: DC | PRN
  Filled 2015-05-04: qty 1

## 2015-05-04 MED ORDER — SODIUM CHLORIDE 0.9 % IV SOLN
INTRAVENOUS | Status: AC
  Administered 2015-05-04: 1000 mL via INTRAPERITONEAL
  Filled 2015-05-04: qty 6

## 2015-05-04 MED ORDER — ONDANSETRON HCL 4 MG/2ML IJ SOLN
INTRAMUSCULAR | Status: AC
  Filled 2015-05-04: qty 2

## 2015-05-04 MED ORDER — POTASSIUM CHLORIDE 10 MEQ/100ML IV SOLN
10.0000 meq | INTRAVENOUS | Status: AC
Start: 2015-05-04 — End: 2015-05-04
  Administered 2015-05-04 (×3): 10 meq via INTRAVENOUS
  Filled 2015-05-04 (×2): qty 100

## 2015-05-04 MED ORDER — FENTANYL CITRATE (PF) 100 MCG/2ML IJ SOLN
INTRAMUSCULAR | Status: DC | PRN
  Administered 2015-05-04 (×3): 50 ug via INTRAVENOUS
  Administered 2015-05-04: 25 ug via INTRAVENOUS
  Administered 2015-05-04: 50 ug via INTRAVENOUS
  Administered 2015-05-04: 25 ug via INTRAVENOUS

## 2015-05-04 MED ORDER — HYDROMORPHONE HCL 1 MG/ML IJ SOLN
INTRAMUSCULAR | Status: DC | PRN
  Administered 2015-05-04 (×4): 0.5 mg via INTRAVENOUS

## 2015-05-04 MED ORDER — SUCCINYLCHOLINE CHLORIDE 20 MG/ML IJ SOLN
INTRAMUSCULAR | Status: DC | PRN
  Administered 2015-05-04: 100 mg via INTRAVENOUS

## 2015-05-04 MED ORDER — METOCLOPRAMIDE HCL 5 MG/ML IJ SOLN
INTRAMUSCULAR | Status: DC | PRN
  Administered 2015-05-04: 10 mg via INTRAVENOUS

## 2015-05-04 MED ORDER — METOPROLOL TARTRATE 1 MG/ML IV SOLN: 5.0000 mg | Freq: Four times a day (QID) | INTRAVENOUS | Status: DC | PRN

## 2015-05-04 MED ORDER — SODIUM CHLORIDE 0.9 % IJ SOLN
INTRAMUSCULAR | Status: AC
  Filled 2015-05-04: qty 100

## 2015-05-04 MED ORDER — ONDANSETRON HCL 4 MG/2ML IJ SOLN
4.0000 mg | Freq: Once | INTRAMUSCULAR | Status: AC | PRN
  Administered 2015-05-04: 4 mg via INTRAVENOUS

## 2015-05-04 MED ORDER — BUPIVACAINE-EPINEPHRINE 0.25% -1:200000 IJ SOLN
INTRAMUSCULAR | Status: AC
  Filled 2015-05-04: qty 1

## 2015-05-04 MED ORDER — ROCURONIUM BROMIDE 100 MG/10ML IV SOLN
INTRAVENOUS | Status: DC | PRN
  Administered 2015-05-04: 10 mg via INTRAVENOUS
  Administered 2015-05-04: 30 mg via INTRAVENOUS
  Administered 2015-05-04 (×2): 10 mg via INTRAVENOUS

## 2015-05-04 MED ORDER — LACTATED RINGERS IV SOLN
INTRAVENOUS | Status: DC | PRN
  Administered 2015-05-04: 09:00:00 via INTRAVENOUS

## 2015-05-04 MED ORDER — GLYCOPYRROLATE 0.2 MG/ML IJ SOLN
INTRAMUSCULAR | Status: AC
  Filled 2015-05-04: qty 1

## 2015-05-04 MED ORDER — MIDAZOLAM HCL 5 MG/5ML IJ SOLN
INTRAMUSCULAR | Status: DC | PRN
  Administered 2015-05-04 (×2): 1 mg via INTRAVENOUS

## 2015-05-04 MED ORDER — ACETAMINOPHEN 10 MG/ML IV SOLN
INTRAVENOUS | Status: DC | PRN
  Administered 2015-05-04: 1000 mg via INTRAVENOUS

## 2015-05-04 MED ORDER — FENTANYL CITRATE (PF) 100 MCG/2ML IJ SOLN: 25.0000 ug | INTRAMUSCULAR | Status: DC | PRN

## 2015-05-04 MED ORDER — HYDROMORPHONE HCL 1 MG/ML IJ SOLN
0.5000 mg | INTRAMUSCULAR | Status: DC | PRN
  Administered 2015-05-04 – 2015-05-05 (×3): 1 mg via INTRAVENOUS
  Filled 2015-05-04 (×4): qty 1

## 2015-05-04 MED ORDER — HYDROMORPHONE HCL 2 MG/ML IJ SOLN
INTRAMUSCULAR | Status: AC
  Filled 2015-05-04: qty 1

## 2015-05-04 MED ORDER — DOFETILIDE 250 MCG PO CAPS
250.0000 ug | ORAL_CAPSULE | Freq: Two times a day (BID) | ORAL | Status: DC
  Administered 2015-05-04 – 2015-05-09 (×10): 250 ug via ORAL
  Filled 2015-05-04 (×11): qty 1

## 2015-05-04 MED ORDER — POTASSIUM CHLORIDE 10 MEQ/100ML IV SOLN: 10.0000 meq | INTRAVENOUS | Status: AC

## 2015-05-04 MED ORDER — LACTATED RINGERS IV SOLN
INTRAVENOUS | Status: DC | PRN
  Administered 2015-05-04 (×3): via INTRAVENOUS

## 2015-05-04 MED ORDER — 0.9 % SODIUM CHLORIDE (POUR BTL) OPTIME
TOPICAL | Status: DC | PRN
  Administered 2015-05-04: 2000 mL

## 2015-05-04 MED ORDER — CEFOTETAN DISODIUM-DEXTROSE 2-2.08 GM-% IV SOLR
INTRAVENOUS | Status: AC
  Filled 2015-05-04: qty 50

## 2015-05-04 MED ORDER — ALVIMOPAN 12 MG PO CAPS: 12.0000 mg | ORAL_CAPSULE | Freq: Two times a day (BID) | ORAL | Status: DC

## 2015-05-04 MED ORDER — DEXAMETHASONE SODIUM PHOSPHATE 10 MG/ML IJ SOLN
INTRAMUSCULAR | Status: DC | PRN
  Administered 2015-05-04: 10 mg via INTRAVENOUS

## 2015-05-04 SURGICAL SUPPLY — 74 items
APPLIER CLIP 5 13 M/L LIGAMAX5 (MISCELLANEOUS)
APPLIER CLIP ROT 10 11.4 M/L (STAPLE)
CABLE HIGH FREQUENCY MONO STRZ (ELECTRODE) ×3 IMPLANT
CELLS DAT CNTRL 66122 CELL SVR (MISCELLANEOUS) IMPLANT
CHLORAPREP W/TINT 26ML (MISCELLANEOUS) ×3 IMPLANT
CLIP APPLIE 5 13 M/L LIGAMAX5 (MISCELLANEOUS) IMPLANT
CLIP APPLIE ROT 10 11.4 M/L (STAPLE) IMPLANT
COUNTER NEEDLE 20 DBL MAG RED (NEEDLE) ×3 IMPLANT
COVER SURGICAL LIGHT HANDLE (MISCELLANEOUS) ×3 IMPLANT
DECANTER SPIKE VIAL GLASS SM (MISCELLANEOUS) ×3 IMPLANT
DRAIN CHANNEL 19F RND (DRAIN) IMPLANT
DRAPE LAPAROSCOPIC ABDOMINAL (DRAPES) ×3 IMPLANT
DRAPE SURG IRRIG POUCH 19X23 (DRAPES) ×3 IMPLANT
DRSG OPSITE POSTOP 4X10 (GAUZE/BANDAGES/DRESSINGS) IMPLANT
DRSG OPSITE POSTOP 4X6 (GAUZE/BANDAGES/DRESSINGS) ×3 IMPLANT
DRSG OPSITE POSTOP 4X8 (GAUZE/BANDAGES/DRESSINGS) IMPLANT
DRSG TEGADERM 2-3/8X2-3/4 SM (GAUZE/BANDAGES/DRESSINGS) ×6 IMPLANT
DRSG TEGADERM 4X4.75 (GAUZE/BANDAGES/DRESSINGS) IMPLANT
ELECT PENCIL ROCKER SW 15FT (MISCELLANEOUS) ×6 IMPLANT
ELECT REM PT RETURN 15FT ADLT (MISCELLANEOUS) ×3 IMPLANT
ENDOLOOP SUT PDS II  0 18 (SUTURE)
ENDOLOOP SUT PDS II 0 18 (SUTURE) IMPLANT
EVACUATOR SILICONE 100CC (DRAIN) ×3 IMPLANT
GAUZE SPONGE 2X2 8PLY STRL LF (GAUZE/BANDAGES/DRESSINGS) ×1 IMPLANT
GAUZE SPONGE 4X4 12PLY STRL (GAUZE/BANDAGES/DRESSINGS) ×3 IMPLANT
GLOVE ECLIPSE 8.0 STRL XLNG CF (GLOVE) ×6 IMPLANT
GLOVE INDICATOR 8.0 STRL GRN (GLOVE) ×6 IMPLANT
GOWN STRL REUS W/TWL XL LVL3 (GOWN DISPOSABLE) ×12 IMPLANT
LEGGING LITHOTOMY PAIR STRL (DRAPES) ×3 IMPLANT
LIQUID BAND (GAUZE/BANDAGES/DRESSINGS) ×3 IMPLANT
LUBRICANT JELLY K Y 4OZ (MISCELLANEOUS) IMPLANT
MARKER SKIN DUAL TIP RULER LAB (MISCELLANEOUS) ×3 IMPLANT
PACK COLON (CUSTOM PROCEDURE TRAY) ×3 IMPLANT
PAD POSITIONING PINK XL (MISCELLANEOUS) ×3 IMPLANT
PORT LAP GEL ALEXIS MED 5-9CM (MISCELLANEOUS) IMPLANT
POSITIONER SURGICAL ARM (MISCELLANEOUS) IMPLANT
RTRCTR WOUND ALEXIS 18CM MED (MISCELLANEOUS)
SCISSORS LAP 5X35 DISP (ENDOMECHANICALS) ×6 IMPLANT
SCRUB PCMX 4 OZ (MISCELLANEOUS) ×3 IMPLANT
SEALER TISSUE G2 STRG ARTC 35C (ENDOMECHANICALS) ×3 IMPLANT
SET IRRIG TUBING LAPAROSCOPIC (IRRIGATION / IRRIGATOR) ×3 IMPLANT
SLEEVE XCEL OPT CAN 5 100 (ENDOMECHANICALS) ×9 IMPLANT
SPONGE GAUZE 2X2 STER 10/PKG (GAUZE/BANDAGES/DRESSINGS) ×2
SPONGE LAP 18X18 X RAY DECT (DISPOSABLE) ×3 IMPLANT
STAPLER CIRC ILS CVD 33MM 37CM (STAPLE) ×3 IMPLANT
STAPLER CUT CVD 40MM BLUE (STAPLE) ×3 IMPLANT
STAPLER VISISTAT 35W (STAPLE) IMPLANT
SUCTION POOLE TIP (SUCTIONS) ×3 IMPLANT
SUT MNCRL AB 4-0 PS2 18 (SUTURE) ×3 IMPLANT
SUT PDS AB 1 CTX 36 (SUTURE) IMPLANT
SUT PDS AB 1 TP1 96 (SUTURE) IMPLANT
SUT PROLENE 0 CT 2 (SUTURE) IMPLANT
SUT PROLENE 2 0 SH DA (SUTURE) ×3 IMPLANT
SUT SILK 2 0 (SUTURE) ×2
SUT SILK 2 0 SH CR/8 (SUTURE) ×3 IMPLANT
SUT SILK 2-0 18XBRD TIE 12 (SUTURE) ×1 IMPLANT
SUT SILK 3 0 (SUTURE) ×2
SUT SILK 3 0 SH CR/8 (SUTURE) ×3 IMPLANT
SUT SILK 3-0 18XBRD TIE 12 (SUTURE) ×1 IMPLANT
SUT VICRYL 0 UR6 27IN ABS (SUTURE) IMPLANT
SYRINGE 20CC LL (MISCELLANEOUS) ×3 IMPLANT
SYS LAPSCP GELPORT 120MM (MISCELLANEOUS) ×3
SYSTEM LAPSCP GELPORT 120MM (MISCELLANEOUS) ×1 IMPLANT
TAPE CLOTH 4X10 WHT NS (GAUZE/BANDAGES/DRESSINGS) IMPLANT
TAPE UMBILICAL COTTON 1/8X30 (MISCELLANEOUS) ×3 IMPLANT
TOWEL OR 17X26 10 PK STRL BLUE (TOWEL DISPOSABLE) IMPLANT
TOWEL OR NON WOVEN STRL DISP B (DISPOSABLE) ×3 IMPLANT
TRAY FOLEY W/METER SILVER 14FR (SET/KITS/TRAYS/PACK) ×3 IMPLANT
TROCAR ADV FIXATION 5X100MM (TROCAR) ×3 IMPLANT
TROCAR BLADELESS OPT 5 100 (ENDOMECHANICALS) ×3 IMPLANT
TROCAR XCEL NON-BLD 11X100MML (ENDOMECHANICALS) IMPLANT
TUBING CONNECTING 10 (TUBING) IMPLANT
TUBING CONNECTING 10' (TUBING)
TUBING INSUF HEATED (TUBING) ×3 IMPLANT

## 2015-05-04 NOTE — Progress Notes (Signed)
Pharmacy Note  Pt was ordered a pre-op dose of Entereg. However, the dose was not administered. Per  [facility] and [facility] and [facility] policy, pt is not eligible for post-op doses. Therefore, the order has been discontinued.  Royetta Asal, PharmD, BCPS Pager [phone] 05/04/2015 4:19 PM

## 2015-05-04 NOTE — Progress Notes (Signed)
[place] NOTE  Pharmacy Consult for IV Heparin Indication: atrial fibrillation (PTA Eliquis held)  Assessment:  Please see note from Gretta Arab, PharmD earlier today for full details. Briefly, 70 y.o. female on heparin for bridging from Eliquis periprocedurally.  IV heparin has been on hold since 04:00 this AM.  Now received consult post-op that If SBP > 100, OK to restart IV Heparin at 10PM 3/2 tongiht. NO BOLUS.  Plan:  If above parameter met, begin IV heparin 1050 units/hr at 10PM as heparin level was previously therapeutic on this rate  Check heparin level 8hrs after heparin starts (6AM on 3/3)  Daily heparin level and CBC  F/u ability to resume Eliquis  Elizabeth Mcconnell, PharmD, BCPS Pager: [phone] 05/04/2015 4:09 PM

## 2015-05-04 NOTE — Care Management Important Message (Signed)
Important Message  Patient Details  Name: Elizabeth Mcconnell MRN: [id number] Date of Birth: [birth year]   Medicare Important Message Given:  Yes    Camillo Flaming 05/04/2015, 1:07 [place] Message  Patient Details  Name: Elizabeth Mcconnell MRN: [id number] Date of Birth: [birth year]   Medicare Important Message Given:  Yes    Camillo Flaming 05/04/2015, 1:06 PM

## 2015-05-04 NOTE — Progress Notes (Signed)
[facility] PROGRESS NOTE  [name] [id number] DOB: 11/17/1945 DOA: 05/01/2015 PCP: [name], MD  Summary 05/03/15: Appreciate cardiology/general surgery. I have seen and examined [name] at bedside and reviewed her chart. Elizabeth Mcconnell is a 70 y.o. female with history of persistent atrial fibrillation, atrial flutter status post ablation now on Tikosyn, bronchial asthma who was referred to the ER after patient's CAT scan showed colonic obstruction(?related to end stage diverticulitis) and she is to have laparoscopic colectomy with possible ostomy tomorrow. Her electrolytes are off, including hypokalemia/hypomagnesemia and these were replenished and will need to be closely monitored. Patient is on IV heparin and off Eliquis perioperatively. She denies any complaints. 05/04/15: Postop. Drowsy. Potassium 3.7. Will replenish. Continue Tikosyn, and follow cardiology/general surgery recommendations. Plan Colonic obstruction (HCC)/ Diverticulosis of colon without hemorrhage/Abdominal pain, left lower quadrant/Stricture of sigmoid colon with obstruction  Defer management to general surgery  S/p colectomy  Atrial fibrillation with RVR (HCC)/Pre-operative cardiovascular examination/hypokalemia/hypomagnesemia  Appreciate cardiology  Continue Tikosyn  Replenish potassium/magnesium as needed   Off Eliquis, heparin per pharmacy perioperatively hen ok with surgery Asthma, moderate persistent  No evidence of flare up  Monitor  Code Status: Full Code Family Communication: Spoke with patient, she is drowsy. Disposition Plan: Probably home next week   Consultants:  Cardiology  General surgery  Procedures:  Colectomy  Antibiotics:  HPI/Subjective: No complaints  Objective: Filed Vitals:   05/04/15 1550 05/04/15 2039  BP: 138/64 134/74  Pulse: 73 63  Temp: 97.6 F (36.4 C) 97.7 F (36.5 C)  Resp: 16 18    Intake/Output Summary (Last 24 hours)  at 05/04/15 2358 Last data filed at 05/04/15 2034  Gross per 24 hour  Intake   3070 ml  Output   2525 ml  Net    545 ml   Filed Weights   05/02/15 0109 05/03/15 0553 05/04/15 0448  Weight: 64.819 kg (142 lb 14.4 oz) 64.7 kg (142 lb 10.2 oz) 66.679 kg (147 lb)    Exam:   General:  Comfortable at rest. Somnolent  Cardiovascular: S1-S2 normal. No murmurs. Pulse regular.  Respiratory: Good air entry bilaterally. No rhonchi or rales.  Abdomen: Soft and nontender. Drain in place.. No organomegaly.  Musculoskeletal: No pedal edema   Neurological: Intact  Data Reviewed: Basic Metabolic Panel:  Recent Labs Lab 04/28/15 1641 05/01/15 1915 05/02/15 0436 05/03/15 0204 05/04/15 0521  NA 137 136 141 140 143  K 3.8 3.1* 3.3* 3.4* 3.7  CL 103 101 110 113* 116*  CO2 27 21* 20* 19* 20*  GLUCOSE 89 103* 78 98 88  BUN 11 10 9  5* <5*  CREATININE 0.74 0.94 0.69 0.71 0.74  CALCIUM 9.3 9.1 8.4* 8.2* 8.2*  MG  --   --   --  1.8 2.0   Liver Function Tests:  Recent Labs Lab 04/28/15 1641 05/01/15 1915 05/02/15 0436 05/04/15 0521  AST 13 33 23 30  ALT 9 27 20 31   ALKPHOS 86 76 54 54  BILITOT 0.5 1.0 1.1 0.3  PROT 7.4 7.3 5.6* 5.8*  ALBUMIN 4.3 4.4 3.4* 3.5    Recent Labs Lab 05/01/15 1915  LIPASE 34   No results for input(s): AMMONIA in the last 168 hours. CBC:  Recent Labs Lab 04/28/15 1641 05/01/15 1915 05/02/15 0436 05/03/15 2120 05/04/15 0521  WBC 9.1 12.2* 9.6 8.7 6.4  NEUTROABS 6.2  --  6.0  --   --   HGB 13.5 15.0 11.6* 11.4* 10.6*  HCT  40.2 46.3* 35.1* 35.3* 32.3*  MCV 87.7 91.5 90.5 91.9 90.5  PLT 265.0 290 216 220 192   Cardiac Enzymes: No results for input(s): CKTOTAL, CKMB, CKMBINDEX, TROPONINI in the last 168 hours. BNP (last 3 results) No results for input(s): BNP in the last 8760 hours.  ProBNP (last 3 results) No results for input(s): PROBNP in the last 8760 hours.  CBG:  Recent Labs Lab 05/03/15 1353 05/03/15 1729 05/03/15 2333  05/04/15 0459 05/04/15 1800  GLUCAP 138* 134* 92 90 143*    Recent Results (from the past 240 hour(s))  Surgical pcr screen     Status: None   Collection Time: 05/04/15  5:14 AM  Result Value Ref Range Status   MRSA, PCR NEGATIVE NEGATIVE Final   Staphylococcus aureus NEGATIVE NEGATIVE Final    Comment:        The Xpert SA Assay (FDA approved for NASAL specimens in patients over 54 years of age), is one component of a comprehensive surveillance program.  Test performance has been validated by [facility] for patients greater than or equal to 26 year old. It is not intended to diagnose infection nor to guide or monitor treatment.      Studies: No results found.  Scheduled Meds: . acetaminophen  1,000 mg Oral TID  . diltiazem  300 mg Oral QHS  . dofetilide  250 mcg Oral BID  . feeding supplement  1 Container Oral BID BM  . fluticasone  2 spray Each Nare Daily  . Linaclotide  145 mcg Oral Daily  . magnesium oxide  400 mg Oral Daily  . mometasone-formoterol  2 puff Inhalation BID  . montelukast  10 mg Oral Daily  . ondansetron      . polyethylene glycol  17 g Oral BID  . saccharomyces boulardii  250 mg Oral BID   Continuous Infusions: . sodium chloride 75 mL/hr at 05/04/15 1618  . 0.9 % NaCl with KCl 20 mEq / L 50 mL/hr at 05/04/15 1843  . heparin 1,050 Units/hr (05/04/15 2243)     Time spent: 15 minutes    Rufus Cypert  [facility] Pager [phone]. If 7PM-7AM, please contact night-coverage at [email or web address], password [email or web address] 05/04/2015, 11:58 PM  LOS: 3 days

## 2015-05-04 NOTE — Transfer of Care (Signed)
Immediate Anesthesia Transfer of Care Note  Patient: Elizabeth Mcconnell  Procedure(s) Performed: Procedure(s): LAPAROSCOPIC LOW ANTERIOR RESECTION LAPAROSCOPIC LYSIS OF ADHESIONS, SPLENIC FLEXURE MOBILIZATION (N/A)  Patient Location: PACU  Anesthesia Type:General  Level of Consciousness:  sedated, patient cooperative and responds to stimulation  Airway & Oxygen Therapy:Patient Spontanous Breathing and Patient connected to face mask oxgen  Post-op Assessment:  Report given to PACU RN and Post -op Vital signs reviewed and stable  Post vital signs:  Reviewed and stable  Last Vitals:  Filed Vitals:   05/03/15 2129 05/04/15 0448  BP: 133/62 108/62  Pulse: 66 67  Temp: 36.4 C 36.6 C  Resp: 18 18    Complications: No apparent anesthesia complications

## 2015-05-04 NOTE — Op Note (Signed)
05/04/2015  2:39 PM  PATIENT:  Elizabeth Mcconnell  70 y.o. 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  Open end proximal. Anastomotic rings (proximal w blue prolene suture)  DISPOSITION OF SPECIMEN:  PATHOLOGY  COUNTS:  YES  PLAN OF CARE: Admit to inpatient   PATIENT DISPOSITION:  PACU - hemodynamically stable.  INDICATION:    Patient with known attacks of diverticulitis.  Worsening sigmoid stricture.  Came in with colonic obstruction.  Partially resolved.  I recommended segmental resection.  Fully anticoagulated for recurrent atrial fibrillation.  Cleared by cardiology and medicine.  The anatomy & physiology of the digestive tract was discussed.  The pathophysiology was discussed.  Natural history risks without surgery was discussed.   I worked to give an overview of the disease and the frequent need to have multispecialty involvement.  I feel the risks of no intervention will lead to serious problems that outweigh the operative risks; therefore, I recommended a partial colectomy to remove the  pathology.  Laparoscopic & open techniques were discussed.   Risks such as bleeding, infection, abscess, leak, reoperation, possible ostomy, hernia, heart attack, death, and other risks were discussed.  I noted a good likelihood this will help address the problem.   Goals of post-operative recovery were discussed as well.  We will work to minimize complications.  Educational materials on the pathology had been given in the office.  Questions were answered.    The patient expressed understanding & wished to proceed with surgery.  OR FINDINGS:   Patient had Very hard and inflamed rectosigmoid colon that was quite torturous.  Densely adherent to left pelvic brim.  Some proximal colonic dilation but not severe.  Moderate omental: Adhesions from prior surgeries.    No obvious metastatic disease on visceral parietal peritoneum or liver.  The anastomosis rests 12 cm from the anal verge by rigid proctoscopy.  End-colon to anterior-side-rectal anastomosis.    DESCRIPTION:   Informed consent was confirmed.  The patient underwent general anaesthesia without difficulty.  The patient was positioned appropriately.  VTE prevention in place.  The patient's abdomen was clipped, prepped, & draped in a sterile fashion.  Surgical timeout confirmed our plan.  The patient was positioned in reverse Trendelenburg.  Abdominal entry was gained using optical entry technique in the right upper abdomen.  Entry was clean.  I induced carbon dioxide insufflation.  Camera inspection revealed no injury.  Extra ports were carefully placed under direct laparoscopic visualization.   The patient had moderate omental and colon adhesions to anterior abdominal wall.  These were carefully freed off until the anterior down wall was free of adhesions.  Greater omental adhesions to the rectosigmoid  colon were carefully freed off as well.   The patient had a very thickened, inflamed, and torturous rectosigmoid colon.  Some mild dilated  transverse and descending colon but not too severe.    I reflected the greater omentum and the upper abdomen the small bowel in the upper abdomen. I scored the base of peritoneum of the right side of the mesentery of the left colon from the ligament of Treitz to the peritoneal reflection of the mid rectum.  I elevated the sigmoid mesentery and enetered into the retro-mesenteric plane. We were able to identify the left ureter and gonadal vessels. I skeletonized the lymph nodes off the inferior mesenteric artery pedicle.  I went down to its takeoff from the aorta.  I isolated the inferior mesenteric vein off of the ligament of Treitz just cephalad to that as well.  After confirming the left ureter was out of the way, I went ahead and ligated the inferior mesenteric artery pedicle with bipolar EnSeal just near its takeoff from the aorta.  I did ligate the inferior mesenteric vein in a similar fashion.  We ensured hemostasis.   I gradually help free the rectosigmoid colon off its retroperitoneal attachments.  Was dense concrete along the left pelvic reflection anterior to the ureter and gonadal's in inferior to the dome of the bladder.  Freddrick March this off sharply, leaving a cuff of mesocolon and mesorectum to avoid any injury to the ureter or gonadal's.  With that I could better mobilize the colon in a lateral to medial fashion.  Patient had inflammation going into the proximal rectum as I felt like I had to go more distally.  I skeletonized the mesorectum at the junction at the proximal/mid rectum using blunt dissection & bipolar EnSeal.    Because of the inflammation of the left colon was somewhat foreshortened.  And up mobilizing the splenic flexure of the colon.  First freed off retroperitoneal attachments off the left kidney specially immobilized in a lateral to medial fashion.  Came through the greater omentum and the mid transverse colon and freed that off the transverse mesocolon.  There is no great window in  the lesser sac closed with prior dilation inflammation.  Eventually freed that off and freed the splenic flexure mesentery off its retroperitoneal attachments along the inferior pancreatic ridge.  With that I got much better mobilization of the descending colon and splenic flexure to reach down.   I placed a wound protector through a Pfannenstiel incision in the suprapubic region, taking care to avoid bladder injury. I transected bowel at the mid rectum using a contour stapler. I was able to eviscerate the rectosigmoid and descending colon out the wound.  I chose a region at the splenic flexure/descending colon junction that was soft and easily reached down.  the splenic flexure actually reached more easily with less tension so transected to that, preserving proximal descending colon and left colic mesentery as possible.  I clamped the colon proximal to this area using a soft bowel clamp. I transected  this proximal end of the colon scalpel. I got healthy bleeding mucosa.  I transected back 3 more centimeters were reached better with less of a kink.   we sent the colon off for pathology.  We sized the colon orifice.  I chose a 33 EEA anvil stapler system. I placed the anvil to the open end of the descending colon and closed around it using a 0 Prolene pursestring.  We did copious irrigation with crystalloid solution.  Hemostasis was good.  The distal end of the colon at the handle easily reached down to the rectal stump.  I scrubbed down and did gentle anal dilation and advanced the EEA stapler up the rectal stump.  the rectum 1 and 2. down more posteriorly.  Therefore the spike was brought out at the anterior wall of the mid rectum, 4cm proximal to the staple line of the rectal stump under direct visualization.  I attached the anvil of the proximal colon the spike of the stapler. Anvil was tightened down and held clamped for 60 seconds. The EEA stapler was fired and held clamped for 30 seconds. The stapler  was released & removed. We noted 2 excellent anastomotic rings. Blue stitch is in the proximal ring.  I did rigid proctoscopy noted the anastomosis was at 12 cm from the anal verge consistent with the proximal rectum.  We did a final irrigation of antibiotic solution (900 mg clindamycin/240 mg gentamicin in a liter of crystalloid) & held that for the pelvic air leak test .  The rectum was insufflated the rectum while clamping the colon proximal to that anastomosis.  There was a negative air leak test. There was no tension of mesentery or bowel at the anastomosis.   Tissues looked viable.    We changed gloves.  We did diagnostic laparoscopy.  We aspirated the antibiotic irrigation.  Hemostasis was good.   Ureters & bowel uninjured.  The anastomosis looked healthy.   I placed a drain down the pelvis with the end going over the left pelvic brim where the inflamed rectosigmoid colon had been densely adherent.  We got the greater omentum to come down and reach down towards the pelvis, covering the anastomosis.    We changed gown and gloves.  The patient was re-draped.  Sterile unused instruments were used from this point out per colon SSI prevention protocol.   I closed the 89mm port sites using Monocryl stitch and sterile dressing.   drain site secured with 2-0 Prolene suture.I closed the Pfannenstiel wound using a 0 Vicryl vertical peritoneal closure and a #1 PDS transverse anterior rectal fascial closure. I closed the skin with some interrupted Monocryl stitches. I placed antibiotic-soaked wicks into the closure at the corners & centrally x4 between those areas. I placed a sterile dressing.  OnQ catheters placed & sheaths peeled away.  Patient is being extubated go to recovery room. I discussed postop care with the patient in detail the office & in the holding area. Instructions are written.  I updated the status of the patient to the patient's husband and stepson.  I made recommendations.  I answered questions.   Understanding & appreciation was expressed.   [name], M.D., F.A.C.S. Gastrointestinal and Minimally Invasive Surgery [facility] [address], [place], [place] [address] [phone] Main / Paging

## 2015-05-04 NOTE — Progress Notes (Signed)
[address] [place], [place] [address] Phone: [phone] FAX: [phone]   Elizabeth Mcconnell [id number] 1945-08-31   Assessment  Problem List:   Principal Problem:   Colonic obstruction [facility]) Active Problems:   Asthma, moderate persistent   Diverticulosis of colon without hemorrhage   Atrial fibrillation with RVR (HCC)   Abdominal pain, left lower quadrant   Stricture of sigmoid colon with obstruction   Pre-operative cardiovascular examination   Day of Surgery      Partial colon obstruction most likely from end-stage diverticulitis w stricture  Plan:  Lap sigmoid colectomy:  The anatomy & physiology of the digestive tract was discussed.  The pathophysiology of the colon was discussed.  Natural history risks without surgery was discussed.   I feel the risks of no intervention will lead to serious problems that outweigh the operative risks; therefore, I recommended a partial colectomy to remove the pathology.  Minimally invasive (Robotic/Laparoscopic) & open techniques were discussed.   Risks such as bleeding, infection, abscess, leak, reoperation, injury to other organs, need for repair of tissues / organs, possible ostomy, hernia, heart attack, stroke, death, and other risks were discussed.  I noted a good likelihood this will help address the problem.   Goals of post-operative recovery were discussed as well.   Need for adequate nutrition, daily bowel regimen and healthy physical activity, to optimize recovery was noted as well. We will work to minimize complications.  Educational materials were available as well.  Questions were answered.  The patient expresses understanding & wishes to proceed with surgery.  -VTE prophylaxis- SCDs, etc -mobilize as tolerated to help recovery  [name], M.D., F.A.C.S. Gastrointestinal and Minimally Invasive Surgery [facility] [address],  [place], [place] [address] [phone] Main / Paging   05/04/2015  Subjective:  Feels better Loose BMs Tol PO neomycin  Objective:  Vital signs:  Filed Vitals:   05/03/15 1357 05/03/15 2116 05/03/15 2129 05/04/15 0448  BP: 120/65  133/62 108/62  Pulse: 64  66 67  Temp: 97.4 F (36.3 C)  97.6 F (36.4 C) 97.8 F (36.6 C)  TempSrc: Oral  Oral Oral  Resp: 18  18 18   Height:      Weight:    66.679 kg (147 lb)  SpO2: 100% 99% 99% 99%    Last BM Date: 05/04/15  Intake/Output   Yesterday:  03/01 0701 - 03/02 0700 In: 749.2 [I.V.:649.2; IV Piggyback:100] Out: 1150 [Urine:1150] This shift:  Total I/O In: 512.5 [I.V.:512.5] Out: -   Bowel function:  Flatus: y  BM: scant loose  Drain: n/a  Physical Exam:  General: Pt awake/alert/oriented x4 in no acute distress Eyes: PERRL, normal EOM.  Sclera clear.  No icterus Neuro: CN II-XII intact w/o focal sensory/motor deficits. Lymph: No head/neck/groin lymphadenopathy Psych:  No delerium/psychosis/paranoia HENT: Normocephalic, Mucus membranes moist.  No thrush Neck: Supple, No tracheal deviation Chest: No chest wall pain w good excursion CV:  Pulses intact.  Regular rhythm MS: Normal AROM mjr joints.  No obvious deformity Abdomen: Soft.  Obese Nondistended.  Nontender.  No evidence of peritonitis.  No incarcerated hernias. Ext:  SCDs BLE.  No mjr edema.  No cyanosis Skin: No petechiae / purpura  Results:   Labs: Results for orders placed or performed during the hospital encounter of 05/01/15 (from the past 48 hour(s))  Glucose, capillary     Status: None   Collection Time:  05/02/15 11:51 AM  Result Value Ref Range   Glucose-Capillary 81 65 - 99 mg/dL  APTT     Status: Abnormal   Collection Time: 05/02/15  5:04 PM  Result Value Ref Range   aPTT 52 (H) 24 - 37 seconds    Comment:        IF BASELINE aPTT IS ELEVATED, SUGGEST PATIENT RISK ASSESSMENT BE USED TO DETERMINE APPROPRIATE ANTICOAGULANT  THERAPY.   Heparin level (unfractionated)     Status: Abnormal   Collection Time: 05/02/15  5:05 PM  Result Value Ref Range   Heparin Unfractionated 0.97 (H) 0.30 - 0.70 IU/mL    Comment:        IF HEPARIN RESULTS ARE BELOW EXPECTED VALUES, AND PATIENT DOSAGE HAS BEEN CONFIRMED, SUGGEST FOLLOW UP TESTING OF ANTITHROMBIN III LEVELS.   Glucose, capillary     Status: None   Collection Time: 05/02/15  6:08 PM  Result Value Ref Range   Glucose-Capillary 89 65 - 99 mg/dL  Glucose, capillary     Status: None   Collection Time: 05/02/15 10:59 PM  Result Value Ref Range   Glucose-Capillary 92 65 - 99 mg/dL  Magnesium     Status: None   Collection Time: 05/03/15  2:04 AM  Result Value Ref Range   Magnesium 1.8 1.7 - 2.4 mg/dL  Basic metabolic panel     Status: Abnormal   Collection Time: 05/03/15  2:04 AM  Result Value Ref Range   Sodium 140 135 - 145 mmol/L   Potassium 3.4 (L) 3.5 - 5.1 mmol/L   Chloride 113 (H) 101 - 111 mmol/L   CO2 19 (L) 22 - 32 mmol/L   Glucose, Bld 98 65 - 99 mg/dL   BUN 5 (L) 6 - 20 mg/dL   Creatinine, Ser 0.71 0.44 - 1.00 mg/dL   Calcium 8.2 (L) 8.9 - 10.3 mg/dL   GFR calc non Af Amer >60 >60 mL/min   GFR calc Af Amer >60 >60 mL/min    Comment: (NOTE) The eGFR has been calculated using the CKD EPI equation. This calculation has not been validated in all clinical situations. eGFR's persistently <60 mL/min signify possible Chronic Kidney Disease.    Anion gap 8 5 - 15  Hemoglobin A1c     Status: None   Collection Time: 05/03/15  2:04 AM  Result Value Ref Range   Hgb A1c MFr Bld 5.5 4.8 - 5.6 %    Comment: (NOTE)         Pre-diabetes: 5.7 - 6.4         Diabetes: >6.4         Glycemic control for adults with diabetes: <7.0    Mean Plasma Glucose 111 mg/dL    Comment: (NOTE) Performed At: [facility] [address] [place], [place] [id number] Lindon Romp MD YV:[id number]   APTT     Status: Abnormal   Collection Time: 05/03/15  2:04  AM  Result Value Ref Range   aPTT 85 (H) 24 - 37 seconds    Comment:        IF BASELINE aPTT IS ELEVATED, SUGGEST PATIENT RISK ASSESSMENT BE USED TO DETERMINE APPROPRIATE ANTICOAGULANT THERAPY.   Heparin level (unfractionated)     Status: Abnormal   Collection Time: 05/03/15  2:04 AM  Result Value Ref Range   Heparin Unfractionated 0.73 (H) 0.30 - 0.70 IU/mL    Comment:        IF HEPARIN RESULTS ARE BELOW EXPECTED VALUES,  AND PATIENT DOSAGE HAS BEEN CONFIRMED, SUGGEST FOLLOW UP TESTING OF ANTITHROMBIN III LEVELS.   Glucose, capillary     Status: None   Collection Time: 05/03/15  5:49 AM  Result Value Ref Range   Glucose-Capillary 85 65 - 99 mg/dL  APTT     Status: Abnormal   Collection Time: 05/03/15 10:07 AM  Result Value Ref Range   aPTT 93 (H) 24 - 37 seconds    Comment:        IF BASELINE aPTT IS ELEVATED, SUGGEST PATIENT RISK ASSESSMENT BE USED TO DETERMINE APPROPRIATE ANTICOAGULANT THERAPY.   Heparin level (unfractionated)     Status: Abnormal   Collection Time: 05/03/15 10:07 AM  Result Value Ref Range   Heparin Unfractionated 0.83 (H) 0.30 - 0.70 IU/mL    Comment:        IF HEPARIN RESULTS ARE BELOW EXPECTED VALUES, AND PATIENT DOSAGE HAS BEEN CONFIRMED, SUGGEST FOLLOW UP TESTING OF ANTITHROMBIN III LEVELS.   Glucose, capillary     Status: Abnormal   Collection Time: 05/03/15  1:53 PM  Result Value Ref Range   Glucose-Capillary 138 (H) 65 - 99 mg/dL  Glucose, capillary     Status: Abnormal   Collection Time: 05/03/15  5:29 PM  Result Value Ref Range   Glucose-Capillary 134 (H) 65 - 99 mg/dL  APTT     Status: Abnormal   Collection Time: 05/03/15  9:20 PM  Result Value Ref Range   aPTT 69 (H) 24 - 37 seconds    Comment:        IF BASELINE aPTT IS ELEVATED, SUGGEST PATIENT RISK ASSESSMENT BE USED TO DETERMINE APPROPRIATE ANTICOAGULANT THERAPY.   Heparin level (unfractionated)     Status: None   Collection Time: 05/03/15  9:20 PM  Result Value Ref  Range   Heparin Unfractionated 0.46 0.30 - 0.70 IU/mL    Comment:        IF HEPARIN RESULTS ARE BELOW EXPECTED VALUES, AND PATIENT DOSAGE HAS BEEN CONFIRMED, SUGGEST FOLLOW UP TESTING OF ANTITHROMBIN III LEVELS.   CBC     Status: Abnormal   Collection Time: 05/03/15  9:20 PM  Result Value Ref Range   WBC 8.7 4.0 - 10.5 K/uL   RBC 3.84 (L) 3.87 - 5.11 MIL/uL   Hemoglobin 11.4 (L) 12.0 - 15.0 g/dL   HCT 35.3 (L) 36.0 - 46.0 %   MCV 91.9 78.0 - 100.0 fL   MCH 29.7 26.0 - 34.0 pg   MCHC 32.3 30.0 - 36.0 g/dL   RDW 13.7 11.5 - 15.5 %   Platelets 220 150 - 400 K/uL  Glucose, capillary     Status: None   Collection Time: 05/03/15 11:33 PM  Result Value Ref Range   Glucose-Capillary 92 65 - 99 mg/dL  Glucose, capillary     Status: None   Collection Time: 05/04/15  4:59 AM  Result Value Ref Range   Glucose-Capillary 90 65 - 99 mg/dL  Surgical pcr screen     Status: None   Collection Time: 05/04/15  5:14 AM  Result Value Ref Range   MRSA, PCR NEGATIVE NEGATIVE   Staphylococcus aureus NEGATIVE NEGATIVE    Comment:        The Xpert SA Assay (FDA approved for NASAL specimens in patients over 59 years of age), is one component of a comprehensive surveillance program.  Test performance has been validated by [facility] for patients greater than or equal to 9 year old. It is not  intended to diagnose infection nor to guide or monitor treatment.   Heparin level (unfractionated)     Status: None   Collection Time: 05/04/15  5:21 AM  Result Value Ref Range   Heparin Unfractionated 0.31 0.30 - 0.70 IU/mL    Comment:        IF HEPARIN RESULTS ARE BELOW EXPECTED VALUES, AND PATIENT DOSAGE HAS BEEN CONFIRMED, SUGGEST FOLLOW UP TESTING OF ANTITHROMBIN III LEVELS.   CBC     Status: Abnormal   Collection Time: 05/04/15  5:21 AM  Result Value Ref Range   WBC 6.4 4.0 - 10.5 K/uL   RBC 3.57 (L) 3.87 - 5.11 MIL/uL   Hemoglobin 10.6 (L) 12.0 - 15.0 g/dL   HCT 32.3 (L) 36.0 - 46.0 %    MCV 90.5 78.0 - 100.0 fL   MCH 29.7 26.0 - 34.0 pg   MCHC 32.8 30.0 - 36.0 g/dL   RDW 13.5 11.5 - 15.5 %   Platelets 192 150 - 400 K/uL  Comprehensive metabolic panel     Status: Abnormal   Collection Time: 05/04/15  5:21 AM  Result Value Ref Range   Sodium 143 135 - 145 mmol/L   Potassium 3.7 3.5 - 5.1 mmol/L   Chloride 116 (H) 101 - 111 mmol/L   CO2 20 (L) 22 - 32 mmol/L   Glucose, Bld 88 65 - 99 mg/dL   BUN <5 (L) 6 - 20 mg/dL   Creatinine, Ser 0.74 0.44 - 1.00 mg/dL   Calcium 8.2 (L) 8.9 - 10.3 mg/dL   Total Protein 5.8 (L) 6.5 - 8.1 g/dL   Albumin 3.5 3.5 - 5.0 g/dL   AST 30 15 - 41 U/L   ALT 31 14 - 54 U/L   Alkaline Phosphatase 54 38 - 126 U/L   Total Bilirubin 0.3 0.3 - 1.2 mg/dL   GFR calc non Af Amer >60 >60 mL/min   GFR calc Af Amer >60 >60 mL/min    Comment: (NOTE) The eGFR has been calculated using the CKD EPI equation. This calculation has not been validated in all clinical situations. eGFR's persistently <60 mL/min signify possible Chronic Kidney Disease.    Anion gap 7 5 - 15  Magnesium     Status: None   Collection Time: 05/04/15  5:21 AM  Result Value Ref Range   Magnesium 2.0 1.7 - 2.4 mg/dL    Imaging / Studies: No results found.  Medications / Allergies: per chart  Antibiotics: Anti-infectives    Start     Dose/Rate Route Frequency Ordered Stop   05/04/15 0600  cefoTEtan in Dextrose 5% (CEFOTAN) 2 g IVPB  Status:  Discontinued     2 g over 30 Minutes Intravenous On call to O.R. 05/02/15 1542 05/04/15 0714   05/03/15 1300  neomycin (MYCIFRADIN) tablet 1,000 mg     1,000 mg Oral 3 times per day on Wed 05/02/15 1609 05/03/15 2231   05/02/15 1615  neomycin (MYCIFRADIN) tablet 1,000 mg  Status:  Discontinued     1,000 mg Oral 3 times per day on Tue 05/02/15 1556 05/02/15 1609   05/02/15 0600  piperacillin-tazobactam (ZOSYN) IVPB 3.375 g     3.375 g 12.5 mL/hr over 240 Minutes Intravenous Every 8 hours 05/02/15 0553           Note: Portions of this report may have been transcribed using voice recognition software. Every effort was made to ensure accuracy; however, inadvertent computerized transcription errors may be present.  Any transcriptional errors that result from this process are unintentional.     [name], M.D., F.A.C.S. Gastrointestinal and Minimally Invasive Surgery [facility] [address] [place], [place] [address] [phone] Main / Paging   05/04/2015  CARE TEAM:  PCP: [name], MD  Outpatient Care Team: Patient Care Team: Janith Lima, MD as PCP - General (Internal Medicine) Elsie Stain, MD (Pulmonary Disease) Thompson Grayer, MD (Cardiology) Sherren Mocha, MD (Cardiology) [name], MD (Gynecology) Erline Levine, MD (Neurosurgery) Lafayette Dragon, MD (Gastroenterology)  Inpatient Treatment Team: Treatment Team: Attending Provider: Nat Math, MD; Registered Nurse: Grafton Folk, RN; Consulting Physician: Nolon Nations, MD; Technician: Ennis Forts, NT; Consulting Physician: Michae Kava Lbcardiology, MD; Registered Nurse: Kathleen Argue, RN; Rounding Team: Threasa Beards, MD; Technician: Demaris Callander, NT; Technician: Geoffery Spruce, NT; Registered Nurse: Rosine Beat, RN; Technician: Desma Mcgregor; Student Nurse: Phebe Colla, Student-RN

## 2015-05-04 NOTE — Anesthesia Preprocedure Evaluation (Addendum)
Anesthesia Evaluation  Patient identified by MRN, date of birth, ID band Patient awake    Reviewed: Allergy & Precautions, NPO status , Patient's Chart, lab work & pertinent test results  History of Anesthesia Complications Negative for: history of anesthetic complications  Airway Mallampati: II  TM Distance: <3 FB Neck ROM: Full    Dental  (+) Teeth Intact, Dental Advisory Given   Pulmonary asthma , Current Smoker,    Pulmonary exam normal breath sounds clear to auscultation       Cardiovascular hypertension, Pt. on medications (-) angina(-) Past MI Normal cardiovascular exam+ dysrhythmias Atrial Fibrillation  Rhythm:Regular Rate:Normal  failed cardioversion 11/2012; s/p ablation 02-02-2013 by Dr Rayann Heman EKG 05/02/15: NSR   Neuro/Psych PSYCHIATRIC DISORDERS Anxiety negative neurological ROS     GI/Hepatic negative GI ROS, Neg liver ROS,   Endo/Other  negative endocrine ROS  Renal/GU negative Renal ROS     Musculoskeletal  (+) Arthritis , Osteoarthritis,  Stopped Eliquis 05/02/15   Abdominal   Peds  Hematology  (+) Blood dyscrasia, anemia ,   Anesthesia Other Findings Day of surgery medications reviewed with the patient.  Reproductive/Obstetrics                           Anesthesia Physical Anesthesia Plan  ASA: III  Anesthesia Plan: General   Post-op Pain Management:    Induction: Intravenous  Airway Management Planned: Oral ETT  Additional Equipment:   Intra-op Plan:   Post-operative Plan: Extubation in OR  Informed Consent: I have reviewed the patients History and Physical, chart, labs and discussed the procedure including the risks, benefits and alternatives for the proposed anesthesia with the patient or authorized representative who has indicated his/her understanding and acceptance.   Dental advisory given  Plan Discussed with: CRNA  Anesthesia Plan Comments:  (Risks/benefits of general anesthesia discussed with patient including risk of damage to teeth, lips, gum, and tongue, nausea/vomiting, allergic reactions to medications, and the possibility of heart attack, stroke and death.  All patient questions answered.  Patient wishes to proceed.)        Anesthesia Quick Evaluation

## 2015-05-04 NOTE — Anesthesia Procedure Notes (Signed)
Procedure Name: Intubation Date/Time: 05/04/2015 9:48 AM Performed by: Ashan Cueva, Virgel Gess Pre-anesthesia Checklist: Patient identified, Emergency Drugs available, Suction available, Patient being monitored and Timeout performed Patient Re-evaluated:Patient Re-evaluated prior to inductionOxygen Delivery Method: Circle system utilized Preoxygenation: Pre-oxygenation with 100% oxygen Intubation Type: IV induction Ventilation: Mask ventilation without difficulty Laryngoscope Size: Mac and 4 Grade View: Grade II Tube type: Oral Tube size: 7.5 mm Number of attempts: 1 Airway Equipment and Method: Stylet Placement Confirmation: ETT inserted through vocal cords under direct vision,  positive ETCO2,  CO2 detector and breath sounds checked- equal and bilateral Secured at: 21 cm Tube secured with: Tape Dental Injury: Teeth and Oropharynx as per pre-operative assessment

## 2015-05-04 NOTE — Anesthesia Postprocedure Evaluation (Signed)
Anesthesia Post Note  Patient: Elizabeth Mcconnell  Procedure(s) Performed: Procedure(s) (LRB): LAPAROSCOPIC LOW ANTERIOR RESECTION LAPAROSCOPIC LYSIS OF ADHESIONS, SPLENIC FLEXURE MOBILIZATION (N/A)  Patient location during evaluation: PACU Anesthesia Type: General Level of consciousness: awake and alert Pain management: pain level controlled Vital Signs Assessment: post-procedure vital signs reviewed and stable Respiratory status: spontaneous breathing, nonlabored ventilation, respiratory function stable and patient connected to nasal cannula oxygen Cardiovascular status: blood pressure returned to baseline and stable Postop Assessment: no signs of nausea or vomiting Anesthetic complications: no Comments: Patient with history of afib, intermittently with short runs in pacu, mostly sinus.. Does take 24hr CCB due again tonight, last taken last night.. Hemodynamically stable    Last Vitals:  Filed Vitals:   05/04/15 0448 05/04/15 1410  BP: 108/62 124/88  Pulse: 67 83  Temp: 36.6 C 34.4 C  Resp: 18 15    Last Pain:  Filed Vitals:   05/04/15 1433  PainSc: Asleep                 Zenaida Deed

## 2015-05-04 NOTE — Progress Notes (Signed)
[place] for IV Heparin Indication: atrial fibrillation (PTA Eliquis held)  Allergies  Allergen Reactions  . Tramadol Other (See Comments)    Severe dizziness, weakness  . Amoxicillin-Pot Clavulanate Nausea And Vomiting  . Flagyl [Metronidazole] Nausea And Vomiting  . Flecainide     dizziness  . Omnicef [Cefdinir] Nausea And Vomiting  . Avelox [Moxifloxacin] Hives, Itching and Rash    Patient Measurements: Height: 5\' 5"  (165.1 cm) Weight: 147 lb (66.679 kg) IBW/kg (Calculated) : 57 Heparin Dosing Weight: 57 kg  Vital Signs: Temp: 97.8 F (36.6 C) (03/02 0448) Temp Source: Oral (03/02 0448) BP: 108/62 mmHg (03/02 0448) Pulse Rate: 67 (03/02 0448)  Labs:  Recent Labs  05/01/15 2358 05/02/15 0436  05/03/15 0204 05/03/15 1007 05/03/15 2120 05/04/15 0521  HGB  --  11.6*  --   --   --  11.4* 10.6*  HCT  --  35.1*  --   --   --  35.3* 32.3*  PLT  --  216  --   --   --  220 192  APTT 33  --   < > 85* 93* 69*  --   LABPROT 15.8*  --   --   --   --   --   --   INR 1.30  --   --   --   --   --   --   HEPARINUNFRC 1.90*  --   < > 0.73* 0.83* 0.46 0.31  CREATININE  --  0.69  --  0.71  --   --  0.74  < > = values in this interval not displayed.  Estimated Creatinine Clearance: 58 mL/min (by C-G formula based on Cr of 0.74).   Infusions:  . 0.9 % NaCl with KCl 20 mEq / L 50 mL/hr at 05/04/15 0450    Assessment: 43 yoF presented to ED on 2/27 with c/o abdominal pain and constipation; outpatient CT scan showed colonic obstruction.  PMH includes Eliquis anticoagulation for A-fib which is now held in anticipation of procedure; GI and surgery are consulted.  Pharmacy is consulted to dose IV heparin.   Last reported home dose of Eliquis 5mg  was 2/27 10am   Today, 05/04/2015:  HL 0.31, but Heparin held ~1 hour prior to level and ~ 4 hours prior to surgery today.  Per surgeon, she will also get Lovenox 40mg  SQ x1 dose 1 hour preop.    CBC:   Hgb decreased to 10.6, Plt remain WNL  No bleeding or complications reported.  F/u postop.   Goal of Therapy:  Heparin level 0.3-0.7 units/ml aPTT 66-102 seconds Monitor platelets by anticoagulation protocol: Yes   Plan:   Continue to hold heparin IV infusion - f/u postop plans for anticoagulation.  Daily heparin level, APTT, and CBC   Gretta Arab PharmD, BCPS Pager [phone] 05/04/2015 7:32 AM

## 2015-05-05 LAB — BASIC METABOLIC PANEL
Anion gap: 12 (ref 5–15)
BUN: <5 mg/dL — ABNORMAL LOW (ref 6–20)
CO2: 18 mmol/L — ABNORMAL LOW (ref 22–32)
Calcium: 8.5 mg/dL — ABNORMAL LOW (ref 8.9–10.3)
Chloride: 110 mmol/L (ref 101–111)
Creatinine, Ser: 0.83 mg/dL (ref 0.44–1.00)
GFR calc Af Amer: >60 mL/min (ref >60)
GFR calc non Af Amer: >60 mL/min (ref >60)
Glucose, Bld: 142 mg/dL — ABNORMAL HIGH (ref 65–99)
Potassium: 4.3 mmol/L (ref 3.5–5.1)
Sodium: 140 mmol/L (ref 135–145)

## 2015-05-05 LAB — MAGNESIUM: Magnesium: 1.6 mg/dL — ABNORMAL LOW (ref 1.7–2.4)

## 2015-05-05 LAB — HEPARIN LEVEL (UNFRACTIONATED): Heparin Unfractionated: 0.19 IU/mL — ABNORMAL LOW (ref 0.30–0.70)

## 2015-05-05 LAB — GLUCOSE, CAPILLARY
Glucose-Capillary: 118 mg/dL — ABNORMAL HIGH (ref 65–99)
Glucose-Capillary: 132 mg/dL — ABNORMAL HIGH (ref 65–99)
Glucose-Capillary: 142 mg/dL — ABNORMAL HIGH (ref 65–99)
Glucose-Capillary: 143 mg/dL — ABNORMAL HIGH (ref 65–99)

## 2015-05-05 LAB — HEMOGLOBIN AND HEMATOCRIT, BLOOD
HCT: 28.9 % — ABNORMAL LOW (ref 36.0–46.0)
HCT: 29.2 % — ABNORMAL LOW (ref 36.0–46.0)
Hemoglobin: 9.3 g/dL — ABNORMAL LOW (ref 12.0–15.0)
Hemoglobin: 9.4 g/dL — ABNORMAL LOW (ref 12.0–15.0)

## 2015-05-05 MED ORDER — HEPARIN (PORCINE) IN NACL 100-0.45 UNIT/ML-% IJ SOLN
1250.0000 [IU]/h | INTRAMUSCULAR | Status: DC
  Filled 2015-05-05: qty 250

## 2015-05-05 MED ORDER — ACETAMINOPHEN 500 MG PO TABS
1000.0000 mg | ORAL_TABLET | Freq: Three times a day (TID) | ORAL | Status: DC
Start: 2015-05-05 — End: 2015-05-09
  Administered 2015-05-05 – 2015-05-09 (×12): 1000 mg via ORAL
  Filled 2015-05-05 (×16): qty 2

## 2015-05-05 MED ORDER — ACETAMINOPHEN 500 MG PO TABS: 500.0000 mg | ORAL_TABLET | Freq: Three times a day (TID) | ORAL | Status: DC

## 2015-05-05 MED ORDER — FENTANYL CITRATE (PF) 100 MCG/2ML IJ SOLN: 12.5000 ug | INTRAMUSCULAR | Status: DC | PRN

## 2015-05-05 MED ORDER — ACETAMINOPHEN 500 MG PO TABS: 1000.0000 mg | ORAL_TABLET | Freq: Three times a day (TID) | ORAL | Status: DC

## 2015-05-05 MED ORDER — SODIUM CHLORIDE 0.9% FLUSH
3.0000 mL | Freq: Two times a day (BID) | INTRAVENOUS | Status: DC
  Administered 2015-05-06 – 2015-05-08 (×4): 3 mL via INTRAVENOUS

## 2015-05-05 MED ORDER — METHOCARBAMOL 1000 MG/10ML IJ SOLN
1000.0000 mg | Freq: Four times a day (QID) | INTRAVENOUS | Status: DC | PRN
  Filled 2015-05-05: qty 10

## 2015-05-05 MED ORDER — METHOCARBAMOL 500 MG PO TABS
1000.0000 mg | ORAL_TABLET | Freq: Four times a day (QID) | ORAL | Status: DC | PRN
  Administered 2015-05-06 (×3): 1000 mg via ORAL
  Filled 2015-05-05 (×3): qty 2

## 2015-05-05 MED ORDER — HYDROCODONE-ACETAMINOPHEN 5-325 MG PO TABS: 1.0000 | ORAL_TABLET | ORAL | Status: DC | PRN

## 2015-05-05 MED ORDER — DIPHENHYDRAMINE HCL 50 MG/ML IJ SOLN: 12.5000 mg | Freq: Four times a day (QID) | INTRAMUSCULAR | Status: DC | PRN

## 2015-05-05 MED ORDER — HEPARIN (PORCINE) IN NACL 100-0.45 UNIT/ML-% IJ SOLN
1050.0000 [IU]/h | INTRAMUSCULAR | Status: DC
  Administered 2015-05-05: 1050 [IU]/h via INTRAVENOUS
  Filled 2015-05-05: qty 250

## 2015-05-05 MED ORDER — SODIUM CHLORIDE 0.9% FLUSH: 3.0000 mL | INTRAVENOUS | Status: DC | PRN

## 2015-05-05 MED ORDER — OXYCODONE HCL 5 MG PO TABS
5.0000 mg | ORAL_TABLET | ORAL | Status: DC | PRN
  Administered 2015-05-05: 10 mg via ORAL
  Administered 2015-05-07 – 2015-05-08 (×4): 5 mg via ORAL
  Filled 2015-05-05 (×5): qty 1
  Filled 2015-05-05 (×2): qty 2

## 2015-05-05 MED ORDER — LACTATED RINGERS IV BOLUS (SEPSIS): 1000.0000 mL | Freq: Three times a day (TID) | INTRAVENOUS | Status: AC | PRN

## 2015-05-05 MED ORDER — MAGNESIUM SULFATE 2 GM/50ML IV SOLN
2.0000 g | Freq: Once | INTRAVENOUS | Status: AC
  Administered 2015-05-05: 2 g via INTRAVENOUS
  Filled 2015-05-05: qty 50

## 2015-05-05 MED ORDER — SODIUM CHLORIDE 0.9 % IV SOLN: 250.0000 mL | INTRAVENOUS | Status: DC | PRN

## 2015-05-05 NOTE — Progress Notes (Addendum)
[facility] PROGRESS NOTE  Elizabeth Mcconnell [id number] DOB: Jan 03, 1946 DOA: 05/01/2015 PCP: [name], MD  Summary 05/03/15: Appreciate cardiology/general surgery. I have seen and examined Elizabeth Mcconnell at bedside and reviewed her chart. Elizabeth Mcconnell is a 71 y.o. female with history of persistent atrial fibrillation, atrial flutter status post ablation now on Tikosyn, bronchial asthma who was referred to the ER after patient's CAT scan showed colonic obstruction(?related to end stage diverticulitis) and she is to have laparoscopic colectomy with possible ostomy tomorrow. Her electrolytes are off, including hypokalemia/hypomagnesemia and these were replenished and will need to be closely monitored. Patient is on IV heparin and off Eliquis perioperatively. She denies any complaints. 05/04/15: Postop. Drowsy. Potassium 3.7. Will replenish. Continue Tikosyn, and follow cardiology/general surgery recommendations. 05/05/15: Appreciate general surgery/cardiology. Postop day #1. Patient more with it. Passing gas/bowel movement, ate earlier today, magnesium remains low, she is on heparin drip/Tikosyn. She complains of chills but she has no fever. We will continue to replenish electrolytes, watch for infection, follow general surgery/cardiology recommendations. Plan Colonic obstruction (HCC)/ Diverticulosis of colon without hemorrhage/Abdominal pain, left lower quadrant/Stricture of sigmoid colon with obstruction  Defer management to general surgery  S/p colectomy Atrial fibrillation with RVR (HCC)/Pre-operative cardiovascular examination/hypokalemia/hypomagnesemia  Appreciate cardiology  Continue Tikosyn  Replenish potassium/magnesium as needed   Off Eliquis, heparin per pharmacy perioperatively Asthma, moderate persistent  No evidence of flare up  Monitor  Code Status: Full Code Family Communication: Husband at bedside. Disposition Plan: Probably home next  week   Consultants:  Cardiology  General surgery  Procedures:  Laparoscopic low anterior resection, splenic flexure mobilization, laparoscopic lysis of adhesions, rigid proctoscopy  Antibiotics:  HPI/Subjective: Says she is freezing.  Objective: Filed Vitals:   05/05/15 0539 05/05/15 1200  BP: 144/89 135/89  Pulse: 78 80  Temp: 97.8 F (36.6 C) 98.2 F (36.8 C)  Resp: 18 20    Intake/Output Summary (Last 24 hours) at 05/05/15 2036 Last data filed at 05/05/15 2020  Gross per 24 hour  Intake   1990 ml  Output  12940 ml  Net -10950 ml   Filed Weights   05/03/15 0553 05/04/15 0448 05/05/15 0539  Weight: 64.7 kg (142 lb 10.2 oz) 66.679 kg (147 lb) 70.081 kg (154 lb 8 oz)    Exam:   General:  Comfortable at rest.  Cardiovascular: S1-S2 normal. No murmurs. Pulse regular.  Respiratory: Good air entry bilaterally. No rhonchi or rales.  Abdomen: Soft and nontender. Normal bowel sounds. No organomegaly.  Musculoskeletal: No pedal edema   Neurological: Intact  Data Reviewed: Basic Metabolic Panel:  Recent Labs Lab 05/01/15 1915 05/02/15 0436 05/03/15 0204 05/04/15 0521 05/05/15 0541  NA 136 141 140 143 140  K 3.1* 3.3* 3.4* 3.7 4.3  CL 101 110 113* 116* 110  CO2 21* 20* 19* 20* 18*  GLUCOSE 103* 78 98 88 142*  BUN 10 9 5* <5* <5*  CREATININE 0.94 0.69 0.71 0.74 0.83  CALCIUM 9.1 8.4* 8.2* 8.2* 8.5*  MG  --   --  1.8 2.0 1.6*   Liver Function Tests:  Recent Labs Lab 05/01/15 1915 05/02/15 0436 05/04/15 0521  AST 33 23 30  ALT 27 20 31   ALKPHOS 76 54 54  BILITOT 1.0 1.1 0.3  PROT 7.3 5.6* 5.8*  ALBUMIN 4.4 3.4* 3.5    Recent Labs Lab 05/01/15 1915  LIPASE 34   No results for input(s): AMMONIA in the last 168 hours. CBC:  Recent Labs Lab 05/01/15  1915 05/02/15 0436 05/03/15 2120 05/04/15 0521 05/05/15 0541 05/05/15 1348  WBC 12.2* 9.6 8.7 6.4  --   --   NEUTROABS  --  6.0  --   --   --   --   HGB 15.0 11.6* 11.4* 10.6* 9.3*  9.4*  HCT 46.3* 35.1* 35.3* 32.3* 28.9* 29.2*  MCV 91.5 90.5 91.9 90.5  --   --   PLT 290 216 220 192  --   --    Cardiac Enzymes: No results for input(s): CKTOTAL, CKMB, CKMBINDEX, TROPONINI in the last 168 hours. BNP (last 3 results) No results for input(s): BNP in the last 8760 hours.  ProBNP (last 3 results) No results for input(s): PROBNP in the last 8760 hours.  CBG:  Recent Labs Lab 05/04/15 0459 05/04/15 1800 05/05/15 0003 05/05/15 0559 05/05/15 1145  GLUCAP 90 143* 143* 142* 132*    Recent Results (from the past 240 hour(s))  Surgical pcr screen     Status: None   Collection Time: 05/04/15  5:14 AM  Result Value Ref Range Status   MRSA, PCR NEGATIVE NEGATIVE Final   Staphylococcus aureus NEGATIVE NEGATIVE Final    Comment:        The Xpert SA Assay (FDA approved for NASAL specimens in patients over 18 years of age), is one component of a comprehensive surveillance program.  Test performance has been validated by [facility] for patients greater than or equal to 30 year old. It is not intended to diagnose infection nor to guide or monitor treatment.      Studies: No results found.  Scheduled Meds: . acetaminophen  1,000 mg Oral TID  . diltiazem  300 mg Oral QHS  . dofetilide  250 mcg Oral BID  . feeding supplement  1 Container Oral BID BM  . fluticasone  2 spray Each Nare Daily  . Linaclotide  145 mcg Oral Daily  . mometasone-formoterol  2 puff Inhalation BID  . montelukast  10 mg Oral Daily  . saccharomyces boulardii  250 mg Oral BID  . sodium chloride flush  3 mL Intravenous Q12H   Continuous Infusions: . sodium chloride 50 mL/hr at 05/05/15 1200  . heparin 1,050 Units/hr (05/05/15 2000)     Time spent: 25 minutes    Elizabeth Mcconnell  [facility] Pager [phone]. If 7PM-7AM, please contact night-coverage at [email or web address], password [facility] 05/05/2015, 8:36 PM  LOS: 4 days

## 2015-05-05 NOTE — Progress Notes (Signed)
[facility] Progress Note  1 Day Post-Op  Subjective: Pt doing well, no N/V, tolerating clears.  Had 2 loose uncontrolled BM's (leakage).  Having good UOP.  Hungry/thirsty.  IS up to 2000.  Heparin resumed.  Had a little blood in stools, but no clots.  Pain well controlled, but wanted something lighter than morphine.    Objective: Vital signs in last 24 hours: Temp:  [94 F (34.4 C)-97.8 F (36.6 C)] 97.8 F (36.6 C) (03/03 0539) Pulse Rate:  [63-83] 78 (03/03 0539) Resp:  [11-18] 18 (03/03 0539) BP: (109-152)/(39-89) 144/89 mmHg (03/03 0539) SpO2:  [96 %-100 %] 98 % (03/03 0748) Weight:  [70.081 kg (154 lb 8 oz)] 70.081 kg (154 lb 8 oz) (03/03 0539) Last BM Date: 05/05/15  Intake/Output from previous day: 03/02 0701 - 03/03 0700 In: 4015 [I.V.:3815; IV Piggyback:200] Out: 3155 [Urine:2580; Drains:375; Blood:200] Intake/Output this shift: Total I/O In: -  Out: 740 [Urine:700; Drains:40]  PE: Gen:  Alert, NAD, pleasant Card:  RRR, no M/G/R heard Pulm:  CTA, no W/R/R Abd: Soft, mild distension, mild tenderness, +BS, no HSM, incisions C/I but saturated with bloody drainage, drain with serosanguinous drainage Ext:  No erythema, edema, or tenderness    Lab Results:   Recent Labs  05/03/15 2120 05/04/15 0521 05/05/15 0541  WBC 8.7 6.4  --   HGB 11.4* 10.6* 9.3*  HCT 35.3* 32.3* 28.9*  PLT 220 192  --    BMET  Recent Labs  05/04/15 0521 05/05/15 0541  NA 143 140  K 3.7 4.3  CL 116* 110  CO2 20* 18*  GLUCOSE 88 142*  BUN <5* <5*  CREATININE 0.74 0.83  CALCIUM 8.2* 8.5*   PT/INR No results for input(s): LABPROT, INR in the last 72 hours. CMP     Component Value Date/Time   NA 140 05/05/2015 0541   K 4.3 05/05/2015 0541   CL 110 05/05/2015 0541   CO2 18* 05/05/2015 0541   GLUCOSE 142* 05/05/2015 0541   BUN <5* 05/05/2015 0541   CREATININE 0.83 05/05/2015 0541   CREATININE 0.80 02/22/2015 1527   CALCIUM 8.5* 05/05/2015 0541   PROT 5.8*  05/04/2015 0521   ALBUMIN 3.5 05/04/2015 0521   AST 30 05/04/2015 0521   ALT 31 05/04/2015 0521   ALKPHOS 54 05/04/2015 0521   BILITOT 0.3 05/04/2015 0521   GFRNONAA >60 05/05/2015 0541   GFRAA >60 05/05/2015 0541   Lipase     Component Value Date/Time   LIPASE 34 05/01/2015 1915       Studies/Results: No results found.  Anti-infectives: Anti-infectives    Start     Dose/Rate Route Frequency Ordered Stop   05/04/15 2200  cefoTEtan (CEFOTAN) 2 g in dextrose 5 % 50 mL IVPB     2 g 100 mL/hr over 30 Minutes Intravenous Every 12 hours 05/04/15 1557 05/04/15 2326   05/04/15 1045  clindamycin (CLEOCIN) 900 mg, gentamicin (GARAMYCIN) 240 mg in sodium chloride 0.9 % 1,000 mL for intraperitoneal lavage      Intraperitoneal To Surgery 05/04/15 1038 05/04/15 1337   05/04/15 0600  cefoTEtan in Dextrose 5% (CEFOTAN) 2 g IVPB  Status:  Discontinued     2 g over 30 Minutes Intravenous On call to O.R. 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 POD #1 s/p Laparoscopic low anterior resection, splenic flexure mobilization, LOA, rigid proctoscopy -Has had a couple of loose uncontrolled BM's (probably more leaking), tolerating clears, advance to D1 diet.  Do not advance to solid diet until have more regular BM's -Ambulate and IS -SCD's and resume heparin infusion, hold off on Eliquis for now until Hgb stabilizes -D/c abx, no longer needed -Change  dressings because they are saturated, change daily and as needed -Staples to come out POD #10 -Drain to come out prior to discharge if clear and low output -She does not like morphine,switch pain meds  ABL Anemia -Hgb down to 9.3 today, recheck tomorrow, sooner if symptomatic AFIB with RVR, A flutter -Eliquis on hold -Appreciate cards recs - resume all home meds includingTikosyn, keeping potassium >4, mg >2 Asthma H/o spontaneous pneumothorax s/p LLL resection HTN Tobacco abuse    LOS: 4 days    Nat Christen 05/05/2015, 10:23 AM Pager: [phone]  (7am - 4:30pm M-F; 7am - 11:30am Sa/Su)

## 2015-05-05 NOTE — Progress Notes (Signed)
[place] for IV Heparin Indication: atrial fibrillation (PTA Eliquis held)  Allergies  Allergen Reactions  . Tramadol Other (See Comments)    Severe dizziness, weakness  . Amoxicillin-Pot Clavulanate Nausea And Vomiting  . Flagyl [Metronidazole] Nausea And Vomiting  . Flecainide     dizziness  . Omnicef [Cefdinir] Nausea And Vomiting  . Avelox [Moxifloxacin] Hives, Itching and Rash   Patient Measurements: Height: 5\' 5"  (165.1 cm) Weight: 154 lb 8 oz (70.081 kg) IBW/kg (Calculated) : 57 Heparin Dosing Weight: 57 kg  Vital Signs: Temp: 98.2 F (36.8 C) (03/03 1200) Temp Source: Oral (03/03 1200) BP: 135/89 mmHg (03/03 1200) Pulse Rate: 80 (03/03 1200)  Labs:  Recent Labs  05/03/15 0204 05/03/15 1007  05/03/15 2120 05/04/15 0521 05/05/15 0541 05/05/15 1348  HGB  --   --   < > 11.4* 10.6* 9.3* 9.4*  HCT  --   --   < > 35.3* 32.3* 28.9* 29.2*  PLT  --   --   --  220 192  --   --   APTT 85* 93*  --  69*  --   --   --   HEPARINUNFRC 0.73* 0.83*  --  0.46 0.31  --   --   CREATININE 0.71  --   --   --  0.74 0.83  --   < > = values in this interval not displayed.  Estimated Creatinine Clearance: 61 mL/min (by C-G formula based on Cr of 0.83).  Infusions:  . sodium chloride 50 mL/hr at 05/05/15 1200  . heparin 1,050 Units/hr (05/05/15 1507)   Assessment: 26 yoF presented to ED on 2/27 with c/o abdominal pain and constipation; outpatient CT scan showed colonic obstruction.  PMH includes Eliquis anticoagulation for A-fib which is now held in anticipation of procedure; GI and surgery are consulted.  Pharmacy is consulted to dose IV heparin.   Last reported home dose of Eliquis 5mg  was 2/27 10am   Today, 05/05/2015:  Heparin held for surgery yesterday, was resumed post-operatively last pm, stopped early this am for blood noted in stool    CBC:  Hgb decreased to 9.4, Plt remain WNL  Resume Heparin per CCS if Hgb > 9 and SBP >  100  No further effect of Apixaban on aPTT, monitor Heparin levels only  Goal of Therapy:  Goal Heparin level 0.3-0.7 Monitor platelets by anticoagulation protocol: Yes   Plan:   Resume Heparin at 1500 at rate of 1050 units/hr  Recheck Heparin level in 6 hr  Daily heparin level, APTT, and CBC  Minda Ditto PharmD Pager [phone] 05/05/2015, 3:20 PM

## 2015-05-05 NOTE — Progress Notes (Signed)
Pt had a small brown/bloody stool on bed pad and then a medium sized stool on BSC. VSS at this time. Pa on call paged and gave verbal orders to D/C heparin gtt and then to page Surgery. Surgery paged and reiterated importance of stopping heparin drip and monitoring labs in morning. Will continue to monitor pt closely. Elizabeth Mcconnell

## 2015-05-05 NOTE — Progress Notes (Signed)
Patient Name: Elizabeth Mcconnell Date of Encounter: 05/05/2015     Principal Problem:   Colonic obstruction ([place]) Active Problems:   Asthma, moderate persistent   Diverticulosis of colon without hemorrhage   Atrial fibrillation with RVR (HCC)   Abdominal pain, left lower quadrant   Stricture of sigmoid colon with obstruction   Pre-operative cardiovascular examination    SUBJECTIVE  No complaints. Feeling better. Does not like morphine bc it makes her loopy. No CP or SOB.  CURRENT MEDS . acetaminophen  1,000 mg Oral TID  . diltiazem  300 mg Oral QHS  . dofetilide  250 mcg Oral BID  . feeding supplement  1 Container Oral BID BM  . fluticasone  2 spray Each Nare Daily  . Linaclotide  145 mcg Oral Daily  . magnesium oxide  400 mg Oral Daily  . magnesium sulfate 1 - 4 g bolus IVPB  2 g Intravenous Once  . mometasone-formoterol  2 puff Inhalation BID  . montelukast  10 mg Oral Daily  . polyethylene glycol  17 g Oral BID  . saccharomyces boulardii  250 mg Oral BID    OBJECTIVE  Filed Vitals:   05/04/15 2039 05/05/15 0330 05/05/15 0539 05/05/15 0748  BP: 134/74 152/59 144/89   Pulse: 63 71 78   Temp: 97.7 F (36.5 C)  97.8 F (36.6 C)   TempSrc: Axillary  Oral   Resp: 18  18   Height:      Weight:   154 lb 8 oz (70.081 kg)   SpO2: 98%  99% 98%    Intake/Output Summary (Last 24 hours) at 05/05/15 0756 Last data filed at 05/05/15 0700  Gross per 24 hour  Intake   4015 ml  Output   3155 ml  Net    860 ml   Filed Weights   05/03/15 0553 05/04/15 0448 05/05/15 0539  Weight: 142 lb 10.2 oz (64.7 kg) 147 lb (66.679 kg) 154 lb 8 oz (70.081 kg)    PHYSICAL EXAM  General: Pleasant, NAD. Neuro: Alert and oriented X 3. Moves all extremities spontaneously. Psych: Normal affect. HEENT:  Normal  Neck: Supple without bruits or JVD. Lungs:  Resp regular and unlabored, CTA. Heart: RRR no s3, s4, or murmurs. Abdomen: Soft, non-tender, non-distended, BS + x 4.    Extremities: No clubbing, cyanosis or edema. DP/PT/Radials 2+ and equal bilaterally.  Accessory Clinical Findings  CBC  Recent Labs  05/03/15 2120 05/04/15 0521 05/05/15 0541  WBC 8.7 6.4  --   HGB 11.4* 10.6* 9.3*  HCT 35.3* 32.3* 28.9*  MCV 91.9 90.5  --   PLT 220 192  --    Basic Metabolic Panel  Recent Labs  05/04/15 0521 05/05/15 0541  NA 143 140  K 3.7 4.3  CL 116* 110  CO2 20* 18*  GLUCOSE 88 142*  BUN <5* <5*  CREATININE 0.74 0.83  CALCIUM 8.2* 8.5*  MG 2.0 1.6*   Liver Function Tests  Recent Labs  05/04/15 0521  AST 30  ALT 31  ALKPHOS 54  BILITOT 0.3  PROT 5.8*  ALBUMIN 3.5   Hemoglobin A1C  Recent Labs  05/03/15 0204  HGBA1C 5.5    TELE  NSR with PACs  Radiology/Studies  Ct Abdomen Pelvis W Contrast  05/01/2015  CLINICAL DATA:  Severe diffuse abdominal pain and constipation for 2 weeks. EXAM: CT ABDOMEN AND PELVIS WITH CONTRAST TECHNIQUE: Multidetector CT imaging of the abdomen and pelvis was performed using the standard  protocol following bolus administration of intravenous contrast. CONTRAST:  19mL OMNIPAQUE IOHEXOL 300 MG/ML  SOLN COMPARISON:  12/10/2013 FINDINGS: Lower chest:  No acute findings. Hepatobiliary: No masses or other significant abnormality. Gallbladder is unremarkable. Pancreas: No mass, inflammatory changes, or other significant abnormality. Spleen: Within normal limits in size and appearance. Adrenals/Urinary Tract: No masses identified. No evidence of hydronephrosis. Duplicated left renal collecting system incidentally noted. Stomach/Bowel: No evidence of small bowel dilatation. Diffuse colonic dilatation is seen with multiple air-fluid levels. Diverticulosis is seen involving the sigmoid colon, without definite acute pericolonic inflammatory changes. There is a relatively short segment of wall thickening seen involving the mid sigmoid colon on image 60/series 2. Although this could be due to mild diverticulitis or muscular  hypertrophy, obstructing colon carcinoma cannot definitely be excluded. Vascular/Lymphatic: No pathologically enlarged lymph nodes. No evidence of abdominal aortic aneurysm. Reproductive: Prior hysterectomy noted. Adnexal regions are unremarkable in appearance. A Other: None. Musculoskeletal:  No suspicious bone lesions identified. IMPRESSION: Findings consistent with distal colonic obstruction in the mid sigmoid colon. There is evidence of diverticulosis in this region CT on but there is also a short segment area of concentric wall thickening without acute pericolonic inflammatory changes. Differential diagnosis includes mild diverticulitis, muscular hypertrophy, and an obstructing colon carcinoma cannot definitely be excluded. Recommend clinical correlation, and consider sigmoidoscopy for further evaluation. No evidence of abscess or metastatic disease within the abdomen or pelvis. Electronically Signed   By: Earle Gell M.D.   On: 05/01/2015 16:40    ASSESSMENT AND PLAN  Elizabeth Mcconnell is a 70 y.o. female with a history of PAFib/flutter s/p ablation (2014) on Tikosyn and Eliquis who was admitted to [facility] on 05/01/15 and found to have a small bowel obstruction. 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 Will defer to surgery when we can resume Eliquis or heparin. She had another stool this AM that was not bloody per her RN, Elizabeth Mcconnell.   PAFib/flutter s/p ablation (2014): currently maintaining NSR with some PACs Patient was initially NPO so Tikosyn and Eliquis were held. She was on a heparin drip, but this was discontinued due to bloody stool noted this morning. She has a CHADSVASC score of  3 (HTN, age, female sex). We will need to start heparin or Eliquis when cleared by surgery. Tikosyn resumed last night. She was given fist dose at 1800. Next dose due today at 10am. I will get an ECG this AM to assess QT interval. Plan for at least 5 doses while inpatient. Magnesium was noted to be low at 1.6 today and this was repleted with 2 runs IV mag. Goal K >4 and goal mag >2. I will recheck mag later today. She was continue on home diltiazem CD 300 mg daily for rate control.    HTN:  Moderately controlled. Continue diltiazem CD 300 mg daily.  Judy Pimple PA-C  Pager [phone]  Personally seen and examined. Agree with above. QTc 475ms. Good.  Continue Cornell Barman, MD

## 2015-05-05 NOTE — Progress Notes (Signed)
Pharmacy Consult Note - IV Heparin  Labs: heparin level 0.19  A/P: heparin level subtherapeutic (goal 0.3-0.7) on current IV heparin rate of 1050 units/hr. Per RN, no interruptions in IV heparin or reported bleeding. Increase IV heparin to 1250 units/hr. Recheck heparin level with AM labs  Adrian Saran, PharmD, BCPS Pager [phone] 05/05/2015 10:13 PM

## 2015-05-05 NOTE — Progress Notes (Signed)
Nutrition Follow-up  DOCUMENTATION CODES:   Not applicable  INTERVENTION:  - Continue Boost Breeze BID - Continue diet advancement as outlined by surgery - RD will continue to monitor for needs  NUTRITION DIAGNOSIS:   Inadequate oral intake related to acute illness, nausea, poor appetite as evidenced by per patient/family report. -ongoing  GOAL:   Patient will meet greater than or equal to 90% of their needs -unmet  MONITOR:   PO intake, Supplement acceptance, Diet advancement, Weight trends, Labs, Skin, I & O's   ASSESSMENT:   70 y.o. female with history of persistent atrial fibrillation, atrial flutter status post ablation, asthma was referred to the ER after patient's CAT scan showed colonic obstruction. Patient has been experiencing abdominal pain off and on for last 1 year. Patient has been taking antibiotics off and on. Last 2 weeks patient's abdominal pain has worsened. Patient did take a course of antibiotics for sinusitis recently. Denies any vomiting but does have some nausea denies any diarrhea. Patient did have a bowel movement yesterday. Patient had recently followed up with her gastroenterologist 4 days ago and had a CAT scan done which shows distal colonic obstruction. Patient was referred to the ER. Patient still has some pain around the left quadrant. Abdomen on exam is nondistended and not tender.  3/3 Continue to be unable to perform physical assessment per pt request this AM with multiple visitors and medical staff members in and out of her room for the past few hours. Pt had CLD breakfast tray at bedside with only a few sips/bites taken. She denies this exacerbating abdominal pain or nausea but that she is feeling very wornout this AM, drowsy, and with headache which she associates with being given Morphine.   Per order, pt has now been advanced to Dysphagia 1, thin liquids diet (as of 1000). Per this order, she will then advance to [place] as able. Pt  not meeting needs currently. Nutrition needs increased, as outlined below, following procedure yesterday. Medications reviewed. Labs reviewed; CBGs: 85-143 mg/dL, BUN <5 mg/dL, Ca: 8.5 mg/dL, Mg: 1.6 mg/dL.    2/28 - Pt reports she tolerated breakfast on CLD with no N/V or abdominal pain but she has needed to urinate several times and did have diarrhea after intakes.  - Pt had received lunch tray as RD was entering room so discussion was slightly brief so that broth did not cool and popsicle did not melt.  - Notes reviewed from occurences of nausea, vomiting, diarrhea over the past few days. - Pt states that she has had these issues x2 weeks but they were exacerbated by taking Moviprep on Thursday or Friday of last week and that she has not had vomiting since Friday (2/24). - Pt reports since 2/23 she has only been consuming liquids, soup, and had oatmeal once that she tolerated well; no other solid foods since that time.  - Unable to perform physical assessment or talk about weight trends PTA.  - Per chart review, pt's weight has been stable (142-147) x3 months with no weight hx available previous to that time.    Diet Order:  DIET - DYS 1 Room service appropriate?: Yes; Fluid consistency:: Thin  Skin:  Wound (see comment) (Perineum and abdominal incisions from partial colectomy 05/04/15)  Last BM:  3/3  Height:   Ht Readings from Last 1 Encounters:  05/02/15 5\' 5"  (1.651 m)    Weight:   Wt Readings from Last 1 Encounters:  05/05/15 154 lb 8  oz (70.081 kg)    Ideal Body Weight:  56.82 kg (kg)  BMI:  Body mass index is 25.71 kg/(m^2).  Estimated Nutritional Needs:   Kcal:  1600-1800  Protein:  70-80 grams  Fluid:  2 L/day  EDUCATION NEEDS:   No education needs identified at this time      Jarome Matin, RD, LDN Inpatient Clinical Dietitian Pager # [phone] After hours/weekend pager # [phone]

## 2015-05-06 LAB — COMPREHENSIVE METABOLIC PANEL
ALT: 27 U/L (ref 14–54)
AST: 21 U/L (ref 15–41)
Albumin: 3.2 g/dL — ABNORMAL LOW (ref 3.5–5.0)
Alkaline Phosphatase: 48 U/L (ref 38–126)
Anion gap: 8 (ref 5–15)
BUN: <5 mg/dL — ABNORMAL LOW (ref 6–20)
CO2: 26 mmol/L (ref 22–32)
Calcium: 8.3 mg/dL — ABNORMAL LOW (ref 8.9–10.3)
Chloride: 111 mmol/L (ref 101–111)
Creatinine, Ser: 0.65 mg/dL (ref 0.44–1.00)
GFR calc Af Amer: >60 mL/min (ref >60)
GFR calc non Af Amer: >60 mL/min (ref >60)
Glucose, Bld: 97 mg/dL (ref 65–99)
Potassium: 3.6 mmol/L (ref 3.5–5.1)
Sodium: 145 mmol/L (ref 135–145)
Total Bilirubin: 0.3 mg/dL (ref 0.3–1.2)
Total Protein: 5.6 g/dL — ABNORMAL LOW (ref 6.5–8.1)

## 2015-05-06 LAB — GLUCOSE, CAPILLARY
Glucose-Capillary: 111 mg/dL — ABNORMAL HIGH (ref 65–99)
Glucose-Capillary: 114 mg/dL — ABNORMAL HIGH (ref 65–99)
Glucose-Capillary: 127 mg/dL — ABNORMAL HIGH (ref 65–99)
Glucose-Capillary: 97 mg/dL (ref 65–99)

## 2015-05-06 LAB — CBC
HCT: 29 % — ABNORMAL LOW (ref 36.0–46.0)
Hemoglobin: 9.3 g/dL — ABNORMAL LOW (ref 12.0–15.0)
MCH: 29.9 pg (ref 26.0–34.0)
MCHC: 32.1 g/dL (ref 30.0–36.0)
MCV: 93.2 fL (ref 78.0–100.0)
Platelets: 186 10*3/uL (ref 150–400)
RBC: 3.11 MIL/uL — ABNORMAL LOW (ref 3.87–5.11)
RDW: 14.4 % (ref 11.5–15.5)
WBC: 15.4 10*3/uL — ABNORMAL HIGH (ref 4.0–10.5)

## 2015-05-06 LAB — MAGNESIUM: Magnesium: 1.9 mg/dL (ref 1.7–2.4)

## 2015-05-06 LAB — HEPARIN LEVEL (UNFRACTIONATED): Heparin Unfractionated: 0.54 IU/mL (ref 0.30–0.70)

## 2015-05-06 MED ORDER — APIXABAN 5 MG PO TABS
5.0000 mg | ORAL_TABLET | Freq: Two times a day (BID) | ORAL | Status: DC
  Administered 2015-05-06 – 2015-05-09 (×7): 5 mg via ORAL
  Filled 2015-05-06 (×8): qty 1

## 2015-05-06 MED ORDER — SODIUM CHLORIDE 0.9% FLUSH: 3.0000 mL | INTRAVENOUS | Status: DC | PRN

## 2015-05-06 MED ORDER — SODIUM CHLORIDE 0.9% FLUSH
3.0000 mL | Freq: Two times a day (BID) | INTRAVENOUS | Status: DC
  Administered 2015-05-06 – 2015-05-09 (×6): 3 mL via INTRAVENOUS

## 2015-05-06 MED ORDER — LACTATED RINGERS IV BOLUS (SEPSIS): 1000.0000 mL | Freq: Three times a day (TID) | INTRAVENOUS | Status: AC | PRN

## 2015-05-06 MED ORDER — SODIUM CHLORIDE 0.9 % IV SOLN: 250.0000 mL | INTRAVENOUS | Status: DC | PRN

## 2015-05-06 MED ORDER — MAGNESIUM SULFATE 2 GM/50ML IV SOLN
2.0000 g | Freq: Once | INTRAVENOUS | Status: AC
  Administered 2015-05-06: 2 g via INTRAVENOUS
  Filled 2015-05-06: qty 50

## 2015-05-06 MED ORDER — POTASSIUM CHLORIDE CRYS ER 20 MEQ PO TBCR
40.0000 meq | EXTENDED_RELEASE_TABLET | Freq: Once | ORAL | Status: AC
  Administered 2015-05-06: 40 meq via ORAL
  Filled 2015-05-06: qty 2

## 2015-05-06 NOTE — Progress Notes (Signed)
[facility] PROGRESS NOTE  [name] Amrein [id number] DOB: Apr 14, 1945 DOA: 05/01/2015 PCP: [name], MD  Summary 05/03/15: Appreciate cardiology/general surgery. I have seen and examined Ms. Trembath at bedside and reviewed her chart. Kymesha Achorn is a 70 y.o. female with history of persistent atrial fibrillation, atrial flutter status post ablation now on Tikosyn, bronchial asthma who was referred to the ER after patient's CAT scan showed colonic obstruction(?related to end stage diverticulitis) and she is to have laparoscopic colectomy with possible ostomy tomorrow. Her electrolytes are off, including hypokalemia/hypomagnesemia and these were replenished and will need to be closely monitored. Patient is on IV heparin and off Eliquis perioperatively. She denies any complaints. 05/04/15: Postop. Drowsy. Potassium 3.7. Will replenish. Continue Tikosyn, and follow cardiology/general surgery recommendations. 05/05/15: Appreciate general surgery/cardiology. Postop day #1. Patient more with it. Passing gas/bowel movement, ate earlier today, magnesium remains low, she is on heparin drip/Tikosyn. She complains of chills but she has no fever. We will continue to replenish electrolytes, watch for infection, follow general surgery/cardiology recommendations. 05/06/15: Appreciate general surgery/cardiology. Postop day#2 laparoscopic low anterior resection, splenic flexure mobilization, laparoscopic lysis of adhesions, rigid proctoscopy, doing well, tolerating feeding. Patient now on Eliquis. Magnesium low. Plan Colonic obstruction (HCC)/ Diverticulosis of colon without hemorrhage/Abdominal pain, left lower quadrant/Stricture of sigmoid colon with obstruction- laparoscopic low anterior resection, splenic flexure mobilization, laparoscopic lysis of adhesions, rigid proctoscopy  Doing well  Defer management to general surgery Atrial fibrillation with RVR (HCC)/Pre-operative cardiovascular  examination/hypokalemia/hypomagnesemia  Appreciate cardiology  Continue Tikosyn  Replenish potassium/magnesium as needed   Back on Eliquis Asthma, moderate persistent  No evidence of flare up  Monitor  Code Status: Full Code Family Communication: Husband at bedside. Disposition Plan: Probably home early next week   Consultants:  Cardiology  General surgery  Procedures:  Laparoscopic low anterior resection, splenic flexure mobilization, laparoscopic lysis of adhesions, rigid proctoscopy  Antibiotics:   HPI/Subjective: Feels okay. Ambulated this morning. No complaints.  Objective: Filed Vitals:   05/06/15 0639 05/06/15 0833  BP: 107/52   Pulse: 71 78  Temp: 98.1 F (36.7 C)   Resp: 18 18    Intake/Output Summary (Last 24 hours) at 05/06/15 1402 Last data filed at 05/06/15 1200  Gross per 24 hour  Intake 3205.84 ml  Output  13663 ml  Net -10457.16 ml   Filed Weights   05/04/15 0448 05/05/15 0539 05/06/15 0639  Weight: 66.679 kg (147 lb) 70.081 kg (154 lb 8 oz) 68.992 kg (152 lb 1.6 oz)    Exam:   General:  Comfortable at rest.  Cardiovascular: S1-S2 normal. No murmurs. Pulse regular.  Respiratory: Good air entry bilaterally. No rhonchi or rales.  Abdomen: Soft and nontender. Normal bowel sounds. No organomegaly. Drain in place  Musculoskeletal: No pedal edema   Neurological: Intact  Data Reviewed: Basic Metabolic Panel:  Recent Labs Lab 05/02/15 0436 05/03/15 0204 05/04/15 0521 05/05/15 0541 05/06/15 0606  NA 141 140 143 140 145  K 3.3* 3.4* 3.7 4.3 3.6  CL 110 113* 116* 110 111  CO2 20* 19* 20* 18* 26  GLUCOSE 78 98 88 142* 97  BUN 9 5* <5* <5* <5*  CREATININE 0.69 0.71 0.74 0.83 0.65  CALCIUM 8.4* 8.2* 8.2* 8.5* 8.3*  MG  --  1.8 2.0 1.6* 1.9   Liver Function Tests:  Recent Labs Lab 05/01/15 1915 05/02/15 0436 05/04/15 0521 05/06/15 0606  AST 33 23 30 21   ALT 27 20 31 27   ALKPHOS 76 54 54  48  BILITOT 1.0 1.1 0.3  0.3  PROT 7.3 5.6* 5.8* 5.6*  ALBUMIN 4.4 3.4* 3.5 3.2*    Recent Labs Lab 05/01/15 1915  LIPASE 34   No results for input(s): AMMONIA in the last 168 hours. CBC:  Recent Labs Lab 05/01/15 1915 05/02/15 0436 05/03/15 2120 05/04/15 0521 05/05/15 0541 05/05/15 1348 05/06/15 0606  WBC 12.2* 9.6 8.7 6.4  --   --  15.4*  NEUTROABS  --  6.0  --   --   --   --   --   HGB 15.0 11.6* 11.4* 10.6* 9.3* 9.4* 9.3*  HCT 46.3* 35.1* 35.3* 32.3* 28.9* 29.2* 29.0*  MCV 91.5 90.5 91.9 90.5  --   --  93.2  PLT 290 216 220 192  --   --  186   Cardiac Enzymes: No results for input(s): CKTOTAL, CKMB, CKMBINDEX, TROPONINI in the last 168 hours. BNP (last 3 results) No results for input(s): BNP in the last 8760 hours.  ProBNP (last 3 results) No results for input(s): PROBNP in the last 8760 hours.  CBG:  Recent Labs Lab 05/05/15 1145 05/05/15 1827 05/05/15 2354 05/06/15 0614 05/06/15 1143  GLUCAP 132* 118* 114* 111* 127*    Recent Results (from the past 240 hour(s))  Surgical pcr screen     Status: None   Collection Time: 05/04/15  5:14 AM  Result Value Ref Range Status   MRSA, PCR NEGATIVE NEGATIVE Final   Staphylococcus aureus NEGATIVE NEGATIVE Final    Comment:        The Xpert SA Assay (FDA approved for NASAL specimens in patients over 75 years of age), is one component of a comprehensive surveillance program.  Test performance has been validated by [facility] for patients greater than or equal to 87 year old. It is not intended to diagnose infection nor to guide or monitor treatment.      Studies: No results found.  Scheduled Meds: . acetaminophen  1,000 mg Oral TID  . apixaban  5 mg Oral BID  . diltiazem  300 mg Oral QHS  . dofetilide  250 mcg Oral BID  . feeding supplement  1 Container Oral BID BM  . fluticasone  2 spray Each Nare Daily  . Linaclotide  145 mcg Oral Daily  . magnesium sulfate 1 - 4 g bolus IVPB  2 g Intravenous Once  .  mometasone-formoterol  2 puff Inhalation BID  . montelukast  10 mg Oral Daily  . saccharomyces boulardii  250 mg Oral BID  . sodium chloride flush  3 mL Intravenous Q12H  . sodium chloride flush  3 mL Intravenous Q12H   Continuous Infusions:    Time spent: 25 minutes    Cressie Betzler  [facility] Pager [phone]. If 7PM-7AM, please contact night-coverage at [email or web address], password [facility] 05/06/2015, 2:02 PM  LOS: 5 days

## 2015-05-06 NOTE — Progress Notes (Signed)
Patient Name: [name] Date of Encounter: 05/06/2015     Principal Problem:   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 05/04/2015 Active Problems:   Asthma, moderate persistent   Diverticulosis of colon without hemorrhage   Atrial fibrillation with RVR (HCC)   Abdominal pain, left lower quadrant   Colonic obstruction (HCC)   Pre-operative cardiovascular examination    SUBJECTIVE  No complaints. Feeling better.Did have PAF overnight. Post conversion pause. No CP or SOB.  CURRENT MEDS . acetaminophen  1,000 mg Oral TID  . apixaban  5 mg Oral BID  . diltiazem  300 mg Oral QHS  . dofetilide  250 mcg Oral BID  . feeding supplement  1 Container Oral BID BM  . fluticasone  2 spray Each Nare Daily  . Linaclotide  145 mcg Oral Daily  . mometasone-formoterol  2 puff Inhalation BID  . montelukast  10 mg Oral Daily  . potassium chloride  40 mEq Oral Once  . saccharomyces boulardii  250 mg Oral BID  . sodium chloride flush  3 mL Intravenous Q12H  . sodium chloride flush  3 mL Intravenous Q12H    OBJECTIVE  Filed Vitals:   05/05/15 0748 05/05/15 1200 05/05/15 2113 05/06/15 0639  BP:  135/89 107/58 107/52  Pulse:  80 72 71  Temp:  98.2 F (36.8 C) 98.5 F (36.9 C) 98.1 F (36.7 C)  TempSrc:  Oral Oral Oral  Resp:  20 19 18   Height:      Weight:    152 lb 1.6 oz (68.992 kg)  SpO2: 98% 95% 94% 94%    Intake/Output Summary (Last 24 hours) at 05/06/15 0826 Last data filed at 05/06/15 0646  Gross per 24 hour  Intake 3085.84 ml  Output  13325 ml  Net -10239.16 ml   Filed Weights   05/04/15 0448 05/05/15 0539 05/06/15 0639  Weight: 147 lb (66.679 kg) 154 lb 8 oz (70.081 kg) 152 lb 1.6 oz (68.992 kg)    PHYSICAL EXAM  General: Pleasant, NAD. Neuro: Alert and oriented X 3. Moves all extremities spontaneously. Psych: Normal affect. HEENT:  Normal  Neck: Supple without bruits or JVD. Lungs:  Resp regular and unlabored, CTA. Heart: RRR no  s3, s4, or murmurs. Abdomen: Soft, non-tender, non-distended, BS + x 4.  Extremities: No clubbing, cyanosis or edema. DP/PT/Radials 2+ and equal bilaterally.  Accessory Clinical Findings  CBC  Recent Labs  05/04/15 0521  05/05/15 1348 05/06/15 0606  WBC 6.4  --   --  15.4*  HGB 10.6*  < > 9.4* 9.3*  HCT 32.3*  < > 29.2* 29.0*  MCV 90.5  --   --  93.2  PLT 192  --   --  186  < > = values in this interval not displayed. Basic Metabolic Panel  Recent Labs  05/05/15 0541 05/06/15 0606  NA 140 145  K 4.3 3.6  CL 110 111  CO2 18* 26  GLUCOSE 142* 97  BUN <5* <5*  CREATININE 0.83 0.65  CALCIUM 8.5* 8.3*  MG 1.6* 1.9   Liver Function Tests  Recent Labs  05/04/15 0521 05/06/15 0606  AST 30 21  ALT 31 27  ALKPHOS 54 48  BILITOT 0.3 0.3  PROT 5.8* 5.6*  ALBUMIN 3.5 3.2*     TELE  NSR with PACs now, PAF 3/3-3/4 night. Post conversion pause  Radiology/Studies  Ct Abdomen Pelvis W Contrast  05/01/2015  CLINICAL DATA:  Severe diffuse  abdominal pain and constipation for 2 weeks. EXAM: CT ABDOMEN AND PELVIS WITH CONTRAST TECHNIQUE: Multidetector CT imaging of the abdomen and pelvis was performed using the standard protocol following bolus administration of intravenous contrast. CONTRAST:  162mL OMNIPAQUE IOHEXOL 300 MG/ML  SOLN COMPARISON:  12/10/2013 FINDINGS: Lower chest:  No acute findings. Hepatobiliary: No masses or other significant abnormality. Gallbladder is unremarkable. Pancreas: No mass, inflammatory changes, or other significant abnormality. Spleen: Within normal limits in size and appearance. Adrenals/Urinary Tract: No masses identified. No evidence of hydronephrosis. Duplicated left renal collecting system incidentally noted. Stomach/Bowel: No evidence of small bowel dilatation. Diffuse colonic dilatation is seen with multiple air-fluid levels. Diverticulosis is seen involving the sigmoid colon, without definite acute pericolonic inflammatory changes. There is a  relatively short segment of wall thickening seen involving the mid sigmoid colon on image 60/series 2. Although this could be due to mild diverticulitis or muscular hypertrophy, obstructing colon carcinoma cannot definitely be excluded. Vascular/Lymphatic: No pathologically enlarged lymph nodes. No evidence of abdominal aortic aneurysm. Reproductive: Prior hysterectomy noted. Adnexal regions are unremarkable in appearance. A Other: None. Musculoskeletal:  No suspicious bone lesions identified. IMPRESSION: Findings consistent with distal colonic obstruction in the mid sigmoid colon. There is evidence of diverticulosis in this region CT on but there is also a short segment area of concentric wall thickening without acute pericolonic inflammatory changes. Differential diagnosis includes mild diverticulitis, muscular hypertrophy, and an obstructing colon carcinoma cannot definitely be excluded. Recommend clinical correlation, and consider sigmoidoscopy for further evaluation. No evidence of abscess or metastatic disease within the abdomen or pelvis. Electronically Signed   By: Earle Gell M.D.   On: 05/01/2015 16:40    ASSESSMENT AND PLAN  Elizabeth Mcconnell is a 70 y.o. female with a history of PAFib/flutter s/p ablation (2014) on Tikosyn and Eliquis who was admitted to [facility] on 05/01/15 and found to have a small bowel obstruction. 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 Improving. Getting foley out.    PAFib/flutter s/p ablation (2014): currently maintaining NSR with some PACs. PAF noted 3/3-3/4 over night. Converted.   Patient was initially NPO so Tikosyn and Eliquis were held. Will stop heparin IV and restart Eliquis 5 BID. Discussed with Dr. Johney Maine. She has a  CHADSVASC score of 3 (HTN, age, female sex). We will need to start heparin or Eliquis when cleared by surgery. Tikosyn resumed 3/2 evening. She was given fist dose at 1800. Next dose due today at 10am. Has had 3 doses. 4th dose this AM.  ECG QTc 450ms. OK. Rechecking this AM and tomorrow AM. Plan 5 doses while inpatient. Magnesium was repleted with 2 runs IV mag. Goal K >4 and goal mag >2. She was continue on home diltiazem CD 300 mg daily for rate control.    HTN:  Moderately controlled. Continue diltiazem CD 300 mg daily.  Bobby Rumpf, MD

## 2015-05-06 NOTE — Progress Notes (Signed)
Agree with previous RN's assessment. Ethen Bannan, RN 

## 2015-05-06 NOTE — Progress Notes (Signed)
Patient ambulated in hallway with rolling walker. Patient tolerated well. Also had small loose bowel movement. Dressing on abdomen changed and patient tolerated well. Will continue to closely monitor patient. Elizabeth Mcconnell, Elizabeth Mcconnell

## 2015-05-06 NOTE — Progress Notes (Signed)
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 Active Problems:   Asthma, moderate persistent   Diverticulosis of colon without hemorrhage   Atrial fibrillation with RVR (HCC)   Abdominal pain, left lower quadrant   Colonic obstruction (HCC)   Pre-operative cardiovascular examination   2 Days Post-Op    POST-OPERATIVE DIAGNOSIS: Rectosigmoid colonic stricture with partial colonic obstrcution  PROCEDURE:   LAPAROSCOPIC LOW ANTERIOR RESECTION  SPLENIC FLEXURE MOBILIZATION LAPAROSCOPIC LYSIS OF ADHESIONS RIGID PROCTOSCOPY  SURGEON: Surgeon(s): Michael Boston, MD  ASSISTANT: RN      Partial colon obstruction most likely from end-stage diverticulitis w stricture  Plan:  -adv diet per protocol to solids  -F/U pathology  -stop IV fluids.  PRN fluid boluses  Discussed with cardiology.  Dr. [name] hoping to transition to oral anticoagulation.  I think that is reasonable at this point.  Most likely DC drain before discharge.  DC Foley per protocol  -VTE prophylaxis- SCDs, etc  -mobilize as tolerated to help recovery.  GET HER UP!!!  [name], M.D., F.A.C.S. Gastrointestinal and Minimally Invasive Surgery [facility] [address], [place], [place] [address] [phone] Main / Paging   05/06/2015  Subjective:  Feels better Loose BMs Tol fulls Wants to get up more Pain controlled  Objective:  Vital signs:  Filed Vitals:   05/05/15 0748 05/05/15 1200 05/05/15 2113 05/06/15 0639  BP:  135/89 107/58 107/52  Pulse:  80 72 71  Temp:  98.2 F (36.8 C) 98.5 F (36.9 C) 98.1 F (36.7 C)  TempSrc:  Oral Oral Oral  Resp:  _0 Height:       Weight:    68.992 kg (152 lb 1.6 oz)  SpO2: 98% 95% 94% 94%    Last BM Date: 05/05/15  Intake/Output   Yesterday:  03/03 0701 - 03/04 0700 In: 3085.8 [P.O.:440; I.V.:2645.8] Out: 44920 [Urine:13200; Drains:125] This shift:     Bowel function:  Flatus: y  BM: scant loose  Drain: sero-serosanguinous  Physical Exam:  General: Pt awake/alert/oriented x4 in no acute distress Eyes: PERRL, normal EOM.  Sclera clear.  No icterus Neuro: CN II-XII intact w/o focal sensory/motor deficits. Lymph: No head/neck/groin lymphadenopathy Psych:  No delerium/psychosis/paranoia HENT: Normocephalic, Mucus membranes moist.  No thrush Neck: Supple, No tracheal deviation Chest: No chest wall pain w good excursion CV:  Pulses intact.  Regular rhythm MS: Normal AROM mjr joints.  No obvious deformity Abdomen: Soft.  Obese.   Nondistended.  Min tender at Pfannenstiel incision.  Dressing already off - I removed wisks.  Blake drain stripped of old clots - thin now.  .  No evidence of peritonitis.  No incarcerated hernias. Ext:  SCDs BLE.  No mjr edema.  No cyanosis Skin: No petechiae / purpura  Results:   Labs: Results for orders placed or performed during the hospital encounter of 05/01/15 (from the past 48 hour(s))  Type and screen     Status: None   Collection Time: 05/04/15  9:00 AM  Result Value Ref Range   ABO/RH(D) A POS    Antibody Screen NEG    Sample Expiration 05/07/2015   ABO/Rh     Status: None   Collection Time: 05/04/15  9:15 AM  Result Value Ref Range   ABO/RH(D) A POS   Glucose, capillary     Status: Abnormal   Collection Time: 05/04/15  6:00 PM  Result Value Ref Range   Glucose-Capillary 143 (H) 65 - 99 mg/dL  Glucose, capillary     Status: Abnormal   Collection Time: 05/05/15 12:03 AM  Result Value Ref Range   Glucose-Capillary 143 (H) 65 - 99 mg/dL  Basic metabolic panel     Status: Abnormal   Collection Time: 05/05/15  5:41 AM  Result Value Ref Range   Sodium 140  135 - 145 mmol/L   Potassium 4.3 3.5 - 5.1 mmol/L   Chloride 110 101 - 111 mmol/L   CO2 18 (L) 22 - 32 mmol/L   Glucose, Bld 142 (H) 65 - 99 mg/dL   BUN <5 (L) 6 - 20 mg/dL   Creatinine, Ser 0.83 0.44 - 1.00 mg/dL   Calcium 8.5 (L) 8.9 - 10.3 mg/dL   GFR calc non Af Amer >60 >60 mL/min   GFR calc Af Amer >60 >60 mL/min    Comment: (NOTE) The eGFR has been calculated using the CKD EPI equation. This calculation has not been validated in all clinical situations. eGFR's persistently <60 mL/min signify possible Chronic Kidney Disease.    Anion gap 12 5 - 15  Magnesium     Status: Abnormal   Collection Time: 05/05/15  5:41 AM  Result Value Ref Range   Magnesium 1.6 (L) 1.7 - 2.4 mg/dL  Hemoglobin and hematocrit, blood     Status: Abnormal   Collection Time: 05/05/15  5:41 AM  Result Value Ref Range   Hemoglobin 9.3 (L) 12.0 - 15.0 g/dL   HCT 28.9 (L) 36.0 - 46.0 %  Glucose, capillary     Status: Abnormal   Collection Time: 05/05/15  5:59 AM  Result Value Ref Range   Glucose-Capillary 142 (H) 65 - 99 mg/dL  Glucose, capillary     Status: Abnormal   Collection Time: 05/05/15 11:45 AM  Result Value Ref Range   Glucose-Capillary 132 (H) 65 - 99 mg/dL  Hemoglobin and hematocrit, blood     Status: Abnormal   Collection Time: 05/05/15  1:48 PM  Result Value Ref Range   Hemoglobin 9.4 (L) 12.0 - 15.0 g/dL   HCT 29.2 (L) 36.0 - 46.0 %  Glucose, capillary     Status: Abnormal   Collection Time: 05/05/15  6:27 PM  Result Value Ref Range   Glucose-Capillary 118 (H) 65 - 99 mg/dL  Heparin level (unfractionated)     Status: Abnormal   Collection Time: 05/05/15  9:15 PM  Result Value Ref Range   Heparin Unfractionated 0.19 (L) 0.30 - 0.70 IU/mL    Comment:        IF HEPARIN RESULTS ARE BELOW EXPECTED VALUES, AND PATIENT DOSAGE HAS BEEN CONFIRMED, SUGGEST FOLLOW UP TESTING OF ANTITHROMBIN III LEVELS.   Glucose, capillary     Status: Abnormal   Collection Time: 05/05/15 11:54 PM   Result Value Ref Range   Glucose-Capillary 114 (H) 65 - 99 mg/dL  CBC     Status: Abnormal   Collection Time: 05/06/15  6:06 AM  Result Value Ref Range   WBC 15.4 (H) 4.0 - 10.5 K/uL   RBC 3.11 (L) 3.87 - 5.11 MIL/uL   Hemoglobin 9.3 (L) 12.0 - 15.0 g/dL   HCT 29.0 (L) 36.0 - 46.0 %   MCV 93.2 78.0 - 100.0 fL   MCH 29.9 26.0 -  34.0 pg   MCHC 32.1 30.0 - 36.0 g/dL   RDW 14.4 11.5 - 15.5 %   Platelets 186 150 - 400 K/uL  Comprehensive metabolic panel     Status: Abnormal   Collection Time: 05/06/15  6:06 AM  Result Value Ref Range   Sodium 145 135 - 145 mmol/L   Potassium 3.6 3.5 - 5.1 mmol/L   Chloride 111 101 - 111 mmol/L   CO2 26 22 - 32 mmol/L   Glucose, Bld 97 65 - 99 mg/dL   BUN <5 (L) 6 - 20 mg/dL   Creatinine, Ser 0.65 0.44 - 1.00 mg/dL   Calcium 8.3 (L) 8.9 - 10.3 mg/dL   Total Protein 5.6 (L) 6.5 - 8.1 g/dL   Albumin 3.2 (L) 3.5 - 5.0 g/dL   AST 21 15 - 41 U/L   ALT 27 14 - 54 U/L   Alkaline Phosphatase 48 38 - 126 U/L   Total Bilirubin 0.3 0.3 - 1.2 mg/dL   GFR calc non Af Amer >60 >60 mL/min   GFR calc Af Amer >60 >60 mL/min    Comment: (NOTE) The eGFR has been calculated using the CKD EPI equation. This calculation has not been validated in all clinical situations. eGFR's persistently <60 mL/min signify possible Chronic Kidney Disease.    Anion gap 8 5 - 15  Magnesium     Status: None   Collection Time: 05/06/15  6:06 AM  Result Value Ref Range   Magnesium 1.9 1.7 - 2.4 mg/dL  Heparin level (unfractionated)     Status: None   Collection Time: 05/06/15  6:06 AM  Result Value Ref Range   Heparin Unfractionated 0.54 0.30 - 0.70 IU/mL    Comment:        IF HEPARIN RESULTS ARE BELOW EXPECTED VALUES, AND PATIENT DOSAGE HAS BEEN CONFIRMED, SUGGEST FOLLOW UP TESTING OF ANTITHROMBIN III LEVELS.   Glucose, capillary     Status: Abnormal   Collection Time: 05/06/15  6:14 AM  Result Value Ref Range   Glucose-Capillary 111 (H) 65 - 99 mg/dL    Imaging /  Studies: No results found.  Medications / Allergies: per chart  Antibiotics: Anti-infectives    Start     Dose/Rate Route Frequency Ordered Stop   05/04/15 2200  cefoTEtan (CEFOTAN) 2 g in dextrose 5 % 50 mL IVPB     2 g 100 mL/hr over 30 Minutes Intravenous Every 12 hours 05/04/15 1557 05/04/15 2326   05/04/15 1045  clindamycin (CLEOCIN) 900 mg, gentamicin (GARAMYCIN) 240 mg in sodium chloride 0.9 % 1,000 mL for intraperitoneal lavage      Intraperitoneal To Surgery 05/04/15 1038 05/04/15 1337   05/04/15 0600  cefoTEtan in Dextrose 5% (CEFOTAN) 2 g IVPB  Status:  Discontinued     2 g over 30 Minutes Intravenous On call to O.R. 05/02/15 1542 05/04/15 0714   05/03/15 1300  neomycin (MYCIFRADIN) tablet 1,000 mg     1,000 mg Oral 3 times per day on Wed 05/02/15 1609 05/03/15 2231   05/02/15 1615  neomycin (MYCIFRADIN) tablet 1,000 mg  Status:  Discontinued     1,000 mg Oral 3 times per day on Tue 05/02/15 1556 05/02/15 1609   05/02/15 0600  piperacillin-tazobactam (ZOSYN) IVPB 3.375 g  Status:  Discontinued     3.375 g 12.5 mL/hr over 240 Minutes Intravenous Every 8 hours 05/02/15 0553 05/04/15 1557        Note: Portions of this report may have been  transcribed using voice recognition software. Every effort was made to ensure accuracy; however, inadvertent computerized transcription errors may be present.   Any transcriptional errors that result from this process are unintentional.     [name], M.D., F.A.C.S. Gastrointestinal and Minimally Invasive Surgery [facility] [address] [place], [place] [address] [phone] Main / Paging   05/06/2015  CARE TEAM:  PCP: [name], MD  Outpatient Care Team: Patient Care Team: Janith Lima, MD as PCP - General (Internal Medicine) Elsie Stain, MD (Pulmonary Disease) Thompson Grayer, MD (Cardiology) Sherren Mocha, MD (Cardiology) [name], MD (Gynecology) Erline Levine, MD (Neurosurgery) Lafayette Dragon, MD (Gastroenterology)  Inpatient Treatment Team: Treatment Team: Attending Provider: Nat Math, MD; Consulting Physician: Nolon Nations, MD; Consulting Physician: Michae Kava Lbcardiology, MD; Registered Nurse: Kathleen Argue, RN; Rounding Team: Threasa Beards, MD; Technician: Geoffery Spruce, NT; Registered Nurse: Rosine Beat, RN; Technician: Desma Mcgregor; Student Nurse: Phebe Colla, Student-RN; Technician: Leona Carry, NT; Technician: Ennis Forts, NT; Registered Nurse: Rondell Reams, RN

## 2015-05-06 NOTE — Progress Notes (Signed)
ANTICOAGULATION CONSULT NOTE - Follow Up Consult  Pharmacy Consult for Heparin Indication: atrial fibrillation (PTA Eliquis held)  Allergies  Allergen Reactions  . Tramadol Other (See Comments)    Severe dizziness, weakness  . Amoxicillin-Pot Clavulanate Nausea And Vomiting  . Flagyl [Metronidazole] Nausea And Vomiting  . Flecainide     dizziness  . Omnicef [Cefdinir] Nausea And Vomiting  . Avelox [Moxifloxacin] Hives, Itching and Rash    Patient Measurements: Height: 5\' 5"  (165.1 cm) Weight: 152 lb 1.6 oz (68.992 kg) IBW/kg (Calculated) : 57 Heparin Dosing Weight:   Vital Signs: Temp: 98.1 F (36.7 C) (03/04 0639) Temp Source: Oral (03/04 0639) BP: 107/52 mmHg (03/04 0639) Pulse Rate: 71 (03/04 0639)  Labs:  Recent Labs  05/03/15 1007  05/03/15 2120 05/04/15 0521 05/05/15 0541 05/05/15 1348 05/05/15 2115 05/06/15 0606  HGB  --   < > 11.4* 10.6* 9.3* 9.4*  --  9.3*  HCT  --   < > 35.3* 32.3* 28.9* 29.2*  --  29.0*  PLT  --   --  220 192  --   --   --  186  APTT 93*  --  69*  --   --   --   --   --   HEPARINUNFRC 0.83*  --  0.46 0.31  --   --  0.19* 0.54  CREATININE  --   --   --  0.74 0.83  --   --   --   < > = values in this interval not displayed.  Estimated Creatinine Clearance: 60.7 mL/min (by C-G formula based on Cr of 0.83).   Medications:  Infusions:  . sodium chloride 50 mL/hr at 05/05/15 2306  . heparin 1,250 Units/hr (05/05/15 2304)    Assessment: Patient with heparin level at goal.  No heparin issues noted.  Goal of Therapy:  Heparin level 0.3-0.7 units/ml Monitor platelets by anticoagulation protocol: Yes   Plan:  Continue heparin drip at current rate Recheck level at [place] 05/06/2015,6:58 AM

## 2015-05-07 LAB — BASIC METABOLIC PANEL
Anion gap: 9 (ref 5–15)
BUN: <5 mg/dL — ABNORMAL LOW (ref 6–20)
CO2: 26 mmol/L (ref 22–32)
Calcium: 8.6 mg/dL — ABNORMAL LOW (ref 8.9–10.3)
Chloride: 109 mmol/L (ref 101–111)
Creatinine, Ser: 0.65 mg/dL (ref 0.44–1.00)
GFR calc Af Amer: >60 mL/min (ref >60)
GFR calc non Af Amer: >60 mL/min (ref >60)
Glucose, Bld: 103 mg/dL — ABNORMAL HIGH (ref 65–99)
Potassium: 3.5 mmol/L (ref 3.5–5.1)
Sodium: 144 mmol/L (ref 135–145)

## 2015-05-07 LAB — CBC
HCT: 31.6 % — ABNORMAL LOW (ref 36.0–46.0)
Hemoglobin: 10.3 g/dL — ABNORMAL LOW (ref 12.0–15.0)
MCH: 29.8 pg (ref 26.0–34.0)
MCHC: 32.6 g/dL (ref 30.0–36.0)
MCV: 91.3 fL (ref 78.0–100.0)
Platelets: 198 10*3/uL (ref 150–400)
RBC: 3.46 MIL/uL — ABNORMAL LOW (ref 3.87–5.11)
RDW: 14 % (ref 11.5–15.5)
WBC: 13.2 10*3/uL — ABNORMAL HIGH (ref 4.0–10.5)

## 2015-05-07 LAB — GLUCOSE, CAPILLARY
Glucose-Capillary: 101 mg/dL — ABNORMAL HIGH (ref 65–99)
Glucose-Capillary: 105 mg/dL — ABNORMAL HIGH (ref 65–99)
Glucose-Capillary: 106 mg/dL — ABNORMAL HIGH (ref 65–99)
Glucose-Capillary: 82 mg/dL (ref 65–99)

## 2015-05-07 LAB — MAGNESIUM: Magnesium: 2 mg/dL (ref 1.7–2.4)

## 2015-05-07 MED ORDER — ESTRADIOL 0.05 MG/24HR TD PTWK
0.0500 mg | MEDICATED_PATCH | TRANSDERMAL | Status: DC
  Administered 2015-05-07: 0.05 mg via TRANSDERMAL
  Filled 2015-05-07: qty 1

## 2015-05-07 MED ORDER — ESTRADIOL 0.1 MG/24HR TD PTWK
0.1000 mg | MEDICATED_PATCH | TRANSDERMAL | Status: DC
  Filled 2015-05-07: qty 1

## 2015-05-07 MED ORDER — MELATONIN 3 MG PO TABS: 1.0000 | ORAL_TABLET | Freq: Every day | ORAL | Status: DC | PRN

## 2015-05-07 MED ORDER — POTASSIUM CHLORIDE CRYS ER 20 MEQ PO TBCR
40.0000 meq | EXTENDED_RELEASE_TABLET | Freq: Two times a day (BID) | ORAL | Status: DC
  Administered 2015-05-07 – 2015-05-09 (×5): 40 meq via ORAL
  Filled 2015-05-07 (×5): qty 2

## 2015-05-07 MED ORDER — MAGNESIUM OXIDE 400 (241.3 MG) MG PO TABS
400.0000 mg | ORAL_TABLET | Freq: Every day | ORAL | Status: DC
  Administered 2015-05-07 – 2015-05-09 (×3): 400 mg via ORAL
  Filled 2015-05-07 (×3): qty 1

## 2015-05-07 MED ORDER — ACETAMINOPHEN ER 650 MG PO TBCR: 1300.0000 mg | EXTENDED_RELEASE_TABLET | Freq: Every day | ORAL | Status: DC

## 2015-05-07 MED ORDER — OXYCODONE HCL 5 MG PO TABS: 5.0000 mg | ORAL_TABLET | Freq: Four times a day (QID) | ORAL | Status: DC | PRN

## 2015-05-07 NOTE — Progress Notes (Addendum)
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 Post conversion pause. Tough night she states. No CP or SOB.  CURRENT MEDS . acetaminophen  1,000 mg Oral TID  . apixaban  5 mg Oral BID  . diltiazem  300 mg Oral QHS  . dofetilide  250 mcg Oral BID  . feeding supplement  1 Container Oral BID BM  . fluticasone  2 spray Each Nare Daily  . Linaclotide  145 mcg Oral Daily  . mometasone-formoterol  2 puff Inhalation BID  . montelukast  10 mg Oral Daily  . saccharomyces boulardii  250 mg Oral BID  . sodium chloride flush  3 mL Intravenous Q12H  . sodium chloride flush  3 mL Intravenous Q12H    OBJECTIVE  Filed Vitals:   05/06/15 1455 05/06/15 2113 05/07/15 0308 05/07/15 0609  BP: 122/54 133/56 122/75 139/72  Pulse: 71 74 88 106  Temp: 98.5 F (36.9 C) 98.1 F (36.7 C) 98.5 F (36.9 C) 98.8 F (37.1 C)  TempSrc: Oral Oral Oral Oral  Resp: 18 18 20 20   Height:      Weight:    140 lb 9.6 oz (63.776 kg)  SpO2: 91% 92% 93% 90%    Intake/Output Summary (Last 24 hours) at 05/07/15 0823 Last data filed at 05/07/15 0648  Gross per 24 hour  Intake    480 ml  Output   1228 ml  Net   -748 ml   Filed Weights   05/05/15 0539 05/06/15 0639 05/07/15 0609  Weight: 154 lb 8 oz (70.081 kg) 152 lb 1.6 oz (68.992 kg) 140 lb 9.6 oz (63.776 kg)    PHYSICAL EXAM  General: Pleasant, NAD. Neuro: Alert and oriented X 3. Moves all extremities spontaneously. Psych: Normal affect. HEENT:  Normal  Neck: Supple without bruits or JVD. Lungs:  Resp regular and unlabored,  CTA. Heart: Irreg irreg no s3, s4, or murmurs. Abdomen: Soft, non-tender, non-distended, BS + x 4.  Extremities: No clubbing, cyanosis or edema. DP/PT/Radials 2+ and equal bilaterally.  Accessory Clinical Findings  CBC  Recent Labs  05/06/15 0606 05/07/15 0611  WBC 15.4* 13.2*  HGB 9.3* 10.3*  HCT 29.0* 31.6*  MCV 93.2 91.3  PLT 186 [id number]   Basic Metabolic Panel  Recent Labs  05/06/15 0606 05/07/15 0611  NA 145 144  K 3.6 3.5  CL 111 109  CO2 26 26  GLUCOSE 97 103*  BUN <5* <5*  CREATININE 0.65 0.65  CALCIUM 8.3* 8.6*  MG 1.9 2.0   Liver Function Tests  Recent Labs  05/06/15 0606  AST 21  ALT 27  ALKPHOS 48  BILITOT 0.3  PROT 5.6*  ALBUMIN 3.2*     TELE  AFIB 100-120, PAF 3/3-3/4 night. Post conversion pause  Radiology/Studies  Ct Abdomen Pelvis W Contrast  05/01/2015  CLINICAL DATA:  Severe diffuse abdominal pain and constipation for 2 weeks. EXAM: CT ABDOMEN AND PELVIS WITH CONTRAST TECHNIQUE: Multidetector CT imaging of the abdomen and pelvis was performed using the standard protocol following bolus administration of intravenous contrast. CONTRAST:  18mL OMNIPAQUE  IOHEXOL 300 MG/ML  SOLN COMPARISON:  12/10/2013 FINDINGS: Lower chest:  No acute findings. Hepatobiliary: No masses or other significant abnormality. Gallbladder is unremarkable. Pancreas: No mass, inflammatory changes, or other significant abnormality. Spleen: Within normal limits in size and appearance. Adrenals/Urinary Tract: No masses identified. No evidence of hydronephrosis. Duplicated left renal collecting system incidentally noted. Stomach/Bowel: No evidence of small bowel dilatation. Diffuse colonic dilatation is seen with multiple air-fluid levels. Diverticulosis is seen involving the sigmoid colon, without definite acute pericolonic inflammatory changes. There is a relatively short segment of wall thickening seen involving the mid sigmoid colon on image 60/series 2. Although this could be  due to mild diverticulitis or muscular hypertrophy, obstructing colon carcinoma cannot definitely be excluded. Vascular/Lymphatic: No pathologically enlarged lymph nodes. No evidence of abdominal aortic aneurysm. Reproductive: Prior hysterectomy noted. Adnexal regions are unremarkable in appearance. A Other: None. Musculoskeletal:  No suspicious bone lesions identified. IMPRESSION: Findings consistent with distal colonic obstruction in the mid sigmoid colon. There is evidence of diverticulosis in this region CT on but there is also a short segment area of concentric wall thickening without acute pericolonic inflammatory changes. Differential diagnosis includes mild diverticulitis, muscular hypertrophy, and an obstructing colon carcinoma cannot definitely be excluded. Recommend clinical correlation, and consider sigmoidoscopy for further evaluation. No evidence of abscess or metastatic disease within the abdomen or pelvis. Electronically Signed   By: Earle Gell M.D.   On: 05/01/2015 16:40    ASSESSMENT AND PLAN  Anyelin Mcconnell is a 70 y.o. female with a history of PAFib/flutter s/p ablation (2014) on Tikosyn and Eliquis who was admitted to [facility] on 05/01/15 and found to have a small bowel obstruction. 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 Improving. Getting foley out.    PAFib/flutter s/p ablation (2014): Had episode of atrial fibrillation once again overnight. A postconversion pause of 3.2 seconds at 3 AM. Currently maintaining NSR with some PACs. PAF noted 3/3-3/4 over night as well. Converted.   Patient was initially NPO so Tikosyn and Eliquis were held. Will stop heparin IV and restart Eliquis 5 BID. Discussed with Dr. Johney Maine. She has a CHADSVASC score  of 3 (HTN, age, female sex). We will need to start heparin or Eliquis when cleared by surgery. Tikosyn resumed 3/2 evening.  ECG QTc 433ms. she has received a total of 5 doses of Tikosyn without any adverse arrhythmias. She has demonstrated breakthrough paroxysmal atrial fibrillation which is to be expected at times with inflammatory state surrounding surgery. She was continue on home diltiazem CD 300 mg daily for rate control.  Symptomatic with her AFIB.  -Would restart Mag Ox home dose.   Chronic anticoagulation: Restarted Eliquis 05/06/15 Hemoglobin stable  HTN:  Moderately controlled. Continue diltiazem CD 300 mg daily.  Comfortable from a cardiac perspective for discharge whenever surgery is ready.  Bobby Rumpf, MD

## 2015-05-07 NOTE — Discharge Instructions (Signed)
SURGERY: POST OP INSTRUCTIONS °(Surgery for small bowel obstruction, colon resection, etc)  ° ° °DIET °Follow a light diet the first few days at home.  Start with a bland diet such as soups, liquids, starchy foods, low fat foods, etc.  If you feel full, bloated, or constipated, stay on a ful liquid or pureed/blenderized diet for a few days until you feel better and no longer constipated. °Be sure to drink plenty of fluids every day to avoid getting dehydrated (feeling dizzy, not urinating, etc.). °Gradually add a fiber supplement to your diet over the next week.  Gradually get back to a regular solid diet.  Avoid fast food or heavy meals the first week as you are more likely to get nauseated. °It is expected for your digestive tract to need a few months to get back to normal.  It is common for your bowel movements and stools to be irregular.  You will have occasional bloating and cramping that should eventually fade away.  Until you are eating solid food normally, off all pain medications, and back to regular activities; your bowels will not be normal. °Focus on eating a low-fat, high fiber diet the rest of your life (See Getting to Good Bowel Health, below). ° °CARE of your INCISION or WOUND °It is good for closed incision and even open wounds to be washed every day.  Shower every day.  Short baths are fine.  Wash the incisions and wounds clean with soap & water.    °If you have a closed incision(s), wash the incision with soap & water every day.  You may leave closed incisions open to air if it is dry.   You may cover the incision with clean gauze & replace it after your daily shower for comfort. °If you have skin tapes (Steristrips) or skin glue (Dermabond) on your incision, leave them in place.  They will fall off on their own like a scab.  You may trim any edges that curl up with clean scissors.  If you have staples, set up an appointment for them to be removed in the office in 10 days after surgery.  °If you  have a drain, wash around the skin exit site with soap & water and place a new dressing of gauze or band aid around the skin every day.  Keep the drain site clean & dry.    °If you have an open wound with packing, see wound care instructions.  In general, it is encouraged that you remove your dressing and packing, shower with soap & water, and replace your dressing once a day.  Pack the wound with clean gauze moistened with normal (0.9%) saline to keep the wound moist & uninfected.  Pressure on the dressing for 30 minutes will stop most wound bleeding.  Eventually your body will heal & pull the open wound closed over the next few months.  °Raw open wounds will occasionally bleed or secrete yellow drainage until it heals closed.  Drain sites will drain a little until the drain is removed.  Even closed incisions can have mild bleeding or drainage the first few days until the skin edges scab over & seal.   °If you have an open wound with a wound vac, see wound vac care instructions. ° ° ° ° °ACTIVITIES as tolerated °Start light daily activities --- self-care, walking, climbing stairs-- beginning the day after surgery.  Gradually increase activities as tolerated.  Control your pain to be active.  Stop when you   are tired.  Ideally, walk several times a day, eventually an hour a day.   Most people are back to most day-to-day activities in a few weeks.  It takes 4-8 weeks to get back to unrestricted, intense activity. If you can walk 30 minutes without difficulty, it is safe to try more intense activity such as jogging, treadmill, bicycling, low-impact aerobics, swimming, etc. Save the most intensive and strenuous activity for last (Usually 4-8 weeks after surgery) such as sit-ups, heavy lifting, contact sports, etc.  Refrain from any intense heavy lifting or straining until you are off narcotics for pain control.  You will have off days, but things should improve week-by-week. DO NOT PUSH THROUGH PAIN.  Let pain be  your guide: If it hurts to do something, don't do it.  Pain is your body warning you to avoid that activity for another week until the pain goes down. You may drive when you are no longer taking narcotic prescription pain medication, you can comfortably wear a seatbelt, and you can safely make sudden turns/stops to protect yourself without hesitating due to pain. You may have sexual intercourse when it is comfortable. If it hurts to do something, stop.  MEDICATIONS Take your usually prescribed home medications unless otherwise directed.   Blood thinners:  Usually you can restart any strong blood thinners after the second postoperative day.  It is OK to take aspirin right away.     If you are on strong blood thinners (warfarin/Coumadin, Plavix, Xerelto, Eliquis, Pradaxa, etc), discuss with your surgeon, medicine PCP, and/or cardiologist for instructions on when to restart the blood thinner & if blood monitoring is needed (PT/INR blood check, etc).     PAIN CONTROL Pain after surgery or related to activity is often due to strain/injury to muscle, tendon, nerves and/or incisions.  This pain is usually short-term and will improve in a few months.  To help speed the process of healing and to get back to regular activity more quickly, DO THE FOLLOWING THINGS TOGETHER: 1. Increase activity gradually.  DO NOT PUSH THROUGH PAIN 2. Use Ice and/or Heat 3. Try Gentle Massage and/or Stretching 4. Take over the counter pain medication 5. Take Narcotic prescription pain medication for more severe pain  Good pain control = faster recovery.  It is better to take more medicine to be more active than to stay in bed all day to avoid medications. 1.  Increase activity gradually Avoid heavy lifting at first, then increase to lifting as tolerated over the next 6 weeks. Do not push through the pain.  Listen to your body and avoid positions and maneuvers than reproduce the pain.  Wait a few days before trying  something more intense Walking an hour a day is encouraged to help your body recover faster and more safely.  Start slowly and stop when getting sore.  If you can walk 30 minutes without stopping or pain, you can try more intense activity (running, jogging, aerobics, cycling, swimming, treadmill, sex, sports, weightlifting, etc.) Remember: If it hurts to do it, then dont do it! 2. Use Ice and/or Heat You will have swelling and bruising around the incisions.  This will take several weeks to resolve. Ice packs or heating pads (6-8 times a day, 30-60 minutes at a time) will help sooth soreness & bruising. Some people prefer to use ice alone, heat alone, or alternate between ice & heat.  Experiment and see what works best for you.  Consider trying ice for the first  few days to help decrease swelling and bruising; then, switch to heat to help relax sore spots and speed recovery. Shower every day.  Short baths are fine.  It feels good!  Keep the incisions and wounds clean with soap & water.   3. Try Gentle Massage and/or Stretching Massage at the area of pain many times a day Stop if you feel pain - do not overdo it 4. Take over the counter pain medication This helps the muscle and nerve tissues become less irritable and calm down faster Choose ONE of the following over-the-counter anti-inflammatory medications: Acetaminophen '500mg'$  tabs (Tylenol) 1-2 pills with every meal and just before bedtime (avoid if you have liver problems or if you have acetaminophen in you narcotic prescription) Naproxen '220mg'$  tabs (ex. Aleve, Naprosyn) 1-2 pills twice a day (avoid if you have kidney, stomach, IBD, or bleeding problems) Ibuprofen '200mg'$  tabs (ex. Advil, Motrin) 3-4 pills with every meal and just before bedtime (avoid if you have kidney, stomach, IBD, or bleeding problems) Take with food/snack several times a day as directed for at least 2 weeks to help keep pain / soreness down & more manageable. 5. Take Narcotic  prescription pain medication for more severe pain A prescription for strong pain control is often given to you upon discharge (for example: oxycodone/Percocet, hydrocodone/Norco/Vicodin, or tramadol/Ultram) Take your pain medication as prescribed. Be mindful that most narcotic prescriptions contain Tylenol (acetaminophen) as well - avoid taking too much Tylenol. If you are having problems/concerns with the prescription medicine (does not control pain, nausea, vomiting, rash, itching, etc.), please call us [phone] to see if we need to switch you to a different pain medicine that will work better for you and/or control your side effects better. If you need a refill on your pain medication, you must call the office before 4 pm and on weekdays only.  By federal law, prescriptions for narcotics cannot be called into a pharmacy.  They must be filled out on paper & picked up from our office by the patient or authorized caretaker.  Prescriptions cannot be filled after 4 pm nor on weekends.   WHEN TO CALL us [phone] Severe uncontrolled or worsening pain  Fever over 101 F (38.5 C) Concerns with the incision: Worsening pain, redness, rash/hives, swelling, bleeding, or drainage Reactions / problems with new medications (itching, rash, hives, nausea, etc.) Nausea and/or vomiting Difficulty urinating Difficulty breathing Worsening fatigue, dizziness, lightheadedness, blurred vision Other concerns If you are not getting better after two weeks or are noticing you are getting worse, contact our office [phone] for further advice.  We may need to adjust your medications, re-evaluate you in the office, send you to the emergency room, or see what other things we can do to help. The clinic staff is available to answer your questions during regular business hours (8:30am-5pm).  Please dont hesitate to call and ask to speak to one of our nurses for clinical concerns.    A surgeon from [facility] is always on call at the hospitals 24 hours/day If you have a medical emergency, go to the nearest emergency room or call 911. FOLLOW UP in our office One the day of your discharge from the hospital (or the next business weekday), please call [facility] to set up or confirm an appointment to see your surgeon in the office for a follow-up appointment.  Usually it is 2-3 weeks after your surgery.   If you have skin staples at  your incision(s), let the office know so we can set up a time in the office for the nurse to remove them (usually around 10 days after surgery). Make sure that you call for appointments the day of discharge (or the next business weekday) from the hospital to ensure a convenient appointment time. IF YOU HAVE DISABILITY OR FAMILY LEAVE FORMS, BRING THEM TO THE OFFICE FOR PROCESSING.  DO NOT GIVE THEM TO YOUR DOCTOR.  [facility] [address] [place], [place], [place]  [address] ? [phone] - Main [phone] - [place],  [phone] - Fax [email or web address]  GETTING TO GOOD BOWEL HEALTH. It is expected for your digestive tract to need a few months to get back to normal.  It is common for your bowel movements and stools to be irregular.  You will have occasional bloating and cramping that should eventually fade away.  Until you are eating solid food normally, off all pain medications, and back to regular activities; your bowels will not be normal.   Avoiding constipation The goal: ONE SOFT BOWEL MOVEMENT A DAY!    Drink plenty of fluids.  Choose water first. TAKE A FIBER SUPPLEMENT EVERY DAY THE REST OF YOUR LIFE During your first week back home, gradually add back a fiber supplement every day Experiment which form you can tolerate.   There are many forms such as powders, tablets, wafers, gummies, etc Psyllium bran (Metamucil), methylcellulose (Citrucel), Miralax or Glycolax, Benefiber, Flax Seed.    Adjust the dose week-by-week (1/2 dose/day to 6 doses a day) until you are moving your bowels 1-2 times a day.  Cut back the dose or try a different fiber product if it is giving you problems such as diarrhea or bloating. Sometimes a laxative is needed to help jump-start bowels if constipated until the fiber supplement can help regulate your bowels.  If you are tolerating eating & you are farting, it is okay to try a gentle laxative such as double dose MiraLax, prune juice, or Milk of Magnesia.  Avoid using laxatives too often. Stool softeners can sometimes help counteract the constipating effects of narcotic pain medicines.  It can also cause diarrhea, so avoid using for too long. If you are still constipated despite taking fiber daily, eating solids, and a few doses of laxatives, call our office. Controlling diarrhea Try drinking liquids and eating bland foods for a few days to avoid stressing your intestines further. Avoid dairy products (especially milk & ice cream) for a short time.  The intestines often can lose the ability to digest lactose when stressed. Avoid foods that cause gassiness or bloating.  Typical foods include beans and other legumes, cabbage, broccoli, and dairy foods.  Avoid greasy, spicy, fast foods.  Every person has some sensitivity to other foods, so listen to your body and avoid those foods that trigger problems for you. Probiotics (such as active yogurt, Align, etc) may help repopulate the intestines and colon with normal bacteria and calm down a sensitive digestive tract Adding a fiber supplement gradually can help thicken stools by absorbing excess fluid and retrain the intestines to act more normally.  Slowly increase the dose over a few weeks.  Too much fiber too soon can backfire and cause cramping & bloating. It is okay to try and slow down diarrhea with a few doses of antidiarrheal medicines.   Bismuth subsalicylate (ex. Kayopectate, Pepto Bismol) for a few doses can  help control diarrhea.  Avoid if pregnant.  Loperamide (Imodium) can slow down diarrhea.  Start with one tablet ('2mg'$ ) first.  Avoid if you are having fevers or severe pain.  ILEOSTOMY PATIENTS WILL HAVE CHRONIC DIARRHEA since their colon is not in use.    Drink plenty of liquids.  You will need to drink even more glasses of water/liquid a day to avoid getting dehydrated. Record output from your ileostomy.  Expect to empty the bag every 3-4 hours at first.  Most people with a permanent ileostomy empty their bag 4-6 times at the least.   Use antidiarrheal medicine (especially Imodium) several times a day to avoid getting dehydrated.  Start with a dose at bedtime & breakfast.  Adjust up or down as needed.  Increase antidiarrheal medications as directed to avoid emptying the bag more than 8 times a day (every 3 hours). Work with your wound ostomy nurse to learn care for your ostomy.  See ostomy care instructions. TROUBLESHOOTING IRREGULAR BOWELS 1) Start with a soft & bland diet. No spicy, greasy, or fried foods.  2) Avoid gluten/wheat or dairy products from diet to see if symptoms improve. 3) Miralax 17gm or flax seed mixed in [place]. water or juice-daily. May use 2-4 times a day as needed. 4) Gas-X, Phazyme, etc. as needed for gas & bloating.  5) Prilosec (omeprazole) over-the-counter as needed 6)  Consider probiotics (Align, Activa, etc) to help calm the bowels down  Call your doctor if you are getting worse or not getting better.  Sometimes further testing (cultures, endoscopy, X-ray studies, CT scans, bloodwork, etc.) may be needed to help diagnose and treat the cause of the diarrhea. [facility], [place], [place], [place], [place]  [address] [phone] - Main.    [phone]  - Toll Free.   [phone] - Fax [email or web address]  GETTING TO GOOD BOWEL HEALTH. Irregular bowel habits such as constipation and diarrhea can lead to many problems over  time.  Having one soft bowel movement a day is the most important way to prevent further problems.  The anorectal canal is designed to handle stretching and feces to safely manage our ability to get rid of solid waste (feces, poop, stool) out of our body.  BUT, hard constipated stools can act like ripping concrete bricks and diarrhea can be a burning fire to this very sensitive area of our body, causing inflamed hemorrhoids, anal fissures, increasing risk is perirectal abscesses, abdominal pain/bloating, an making irritable bowel worse.      The goal: ONE SOFT BOWEL MOVEMENT A DAY!  To have soft, regular bowel movements:   Drink plenty of fluids, consider 4-6 tall glasses of water a day.    Take plenty of fiber.  Fiber is the undigested part of plant food that passes into the colon, acting s natures broom to encourage bowel motility and movement.  Fiber can absorb and hold large amounts of water. This results in a larger, bulkier stool, which is soft and easier to pass. Work gradually over several weeks up to 6 servings a day of fiber (25g a day even more if needed) in the form of: o Vegetables -- Root (potatoes, carrots, turnips), leafy green (lettuce, salad greens, celery, spinach), or cooked high residue (cabbage, broccoli, etc) o Fruit -- Fresh (unpeeled skin & pulp), Dried (prunes, apricots, cherries, etc ),  or stewed ( applesauce)  o Whole grain breads, pasta, etc (whole wheat)  o Bran cereals   Bulking Agents -- This type of water-retaining fiber generally  is easily obtained each day by one of the following:  o Psyllium bran -- The psyllium plant is remarkable because its ground seeds can retain so much water. This product is available as Metamucil, Konsyl, Effersyllium, Per Diem Fiber, or the less expensive generic preparation in drug and health food stores. Although labeled a laxative, it really is not a laxative.  o Methylcellulose -- This is another fiber derived from wood which also  retains water. It is available as Citrucel. o Polyethylene Glycol - and artificial fiber commonly called Miralax or Glycolax.  It is helpful for people with gassy or bloated feelings with regular fiber o Flax Seed - a less gassy fiber than psyllium  No reading or other relaxing activity while on the toilet. If bowel movements take longer than 5 minutes, you are too constipated  AVOID CONSTIPATION.  High fiber and water intake usually takes care of this.  Sometimes a laxative is needed to stimulate more frequent bowel movements, but   Laxatives are not a good long-term solution as it can wear the colon out.  They can help jump-start bowels if constipated, but should be relied on constantly without discussing with your doctor o Osmotics (Milk of Magnesia, Fleets phosphosoda, Magnesium citrate, MiraLax, GoLytely) are safer than  o Stimulants (Senokot, Castor Oil, Dulcolax, Ex Lax)    o Avoid taking laxatives for more than 7 days in a row.   IF SEVERELY CONSTIPATED, try a Bowel Retraining Program: o Do not use laxatives.  o Eat a diet high in roughage, such as bran cereals and leafy vegetables.  o Drink six (6) ounces of prune or apricot juice each morning.  o Eat two (2) large servings of stewed fruit each day.  o Take one (1) heaping tablespoon of a psyllium-based bulking agent twice a day. Use sugar-free sweetener when possible to avoid excessive calories.  o Eat a normal breakfast.  o Set aside 15 minutes after breakfast to sit on the toilet, but do not strain to have a bowel movement.  o If you do not have a bowel movement by the third day, use an enema and repeat the above steps.   Controlling diarrhea o Switch to liquids and simpler foods for a few days to avoid stressing your intestines further. o Avoid dairy products (especially milk & ice cream) for a short time.  The intestines often can lose the ability to digest lactose when stressed. o Avoid foods that cause gassiness or  bloating.  Typical foods include beans and other legumes, cabbage, broccoli, and dairy foods.  Every person has some sensitivity to other foods, so listen to our body and avoid those foods that trigger problems for you. o Adding fiber (Citrucel, Metamucil, psyllium, Miralax) gradually can help thicken stools by absorbing excess fluid and retrain the intestines to act more normally.  Slowly increase the dose over a few weeks.  Too much fiber too soon can backfire and cause cramping & bloating. o Probiotics (such as active yogurt, Align, etc) may help repopulate the intestines and colon with normal bacteria and calm down a sensitive digestive tract.  Most studies show it to be of mild help, though, and such products can be costly. o Medicines: - Bismuth subsalicylate (ex. Kayopectate, Pepto Bismol) every 30 minutes for up to 6 doses can help control diarrhea.  Avoid if pregnant. - Loperamide (Immodium) can slow down diarrhea.  Start with two tablets ('4mg'$  total) first and then try one tablet every 6 hours.  Avoid if you are having fevers or severe pain.  If you are not better or start feeling worse, stop all medicines and call your doctor for advice o Call your doctor if you are getting worse or not better.  Sometimes further testing (cultures, endoscopy, X-ray studies, bloodwork, etc) may be needed to help diagnose and treat the cause of the diarrhea.  TROUBLESHOOTING IRREGULAR BOWELS 1) Avoid extremes of bowel movements (no bad constipation/diarrhea) 2) Miralax 17gm mixed in 8oz. water or juice-daily. May use BID as needed.  3) Gas-x,Phazyme, etc. as needed for gas & bloating.  4) Soft,bland diet. No spicy,greasy,fried foods.  5) Prilosec over-the-counter as needed  6) May hold gluten/wheat products from diet to see if symptoms improve.  7)  May try probiotics (Align, Activa, etc) to help calm the bowels down 7) If symptoms become worse call back immediately.  Managing Pain  Pain after surgery or  related to activity is often due to strain/injury to muscle, tendon, nerves and/or incisions.  This pain is usually short-term and will improve in a few months.   Many people find it helpful to do the following things TOGETHER to help speed the process of healing and to get back to regular activity more quickly:  1. Avoid heavy physical activity at first a. No lifting greater than 20 pounds at first, then increase to lifting as tolerated over the next few weeks b. Do not push through the pain.  Listen to your body and avoid positions and maneuvers than reproduce the pain.  Wait a few days before trying something more intense c. Walking is okay as tolerated, but go slowly and stop when getting sore.  If you can walk 30 minutes without stopping or pain, you can try more intense activity (running, jogging, aerobics, cycling, swimming, treadmill, sex, sports, weightlifting, etc ) d. Remember: If it hurts to do it, then dont do it!  2. Take Anti-inflammatory medication a. Choose ONE of the following over-the-counter medications: i.            Acetaminophen 500mg  tabs (Tylenol) 1-2 pills with every meal and just before bedtime (avoid if you have liver problems) ii.            Naproxen 220mg  tabs (ex. Aleve) 1-2 pills twice a day (avoid if you have kidney, stomach, IBD, or bleeding problems) iii. Ibuprofen 200mg  tabs (ex. Advil, Motrin) 3-4 pills with every meal and just before bedtime (avoid if you have kidney, stomach, IBD, or bleeding problems) b. Take with food/snack around the clock for 1-2 weeks i. This helps the muscle and nerve tissues become less irritable and calm down faster  3. Use a Heating pad or Ice/Cold Pack a. 4-6 times a day b. May use warm bath/hottub  or showers  4. Try Gentle Massage and/or Stretching  a. at the area of pain many times a day b. stop if you feel pain - do not overdo it  Try these steps together to help you body heal faster and avoid making things get worse.   Doing just one of these things may not be enough.    If you are not getting better after two weeks or are noticing you are getting worse, contact our office for further advice; we may need to re-evaluate you & see what other things we can do to help.  Atrial Fibrillation Atrial fibrillation is a type of irregular or rapid heartbeat (arrhythmia). In atrial fibrillation, the heart quivers continuously in a chaotic pattern.  This occurs when parts of the heart receive disorganized signals that make the heart unable to pump blood normally. This can increase the risk for stroke, heart failure, and other heart-related conditions. There are different types of atrial fibrillation, including:  Paroxysmal atrial fibrillation. This type starts suddenly, and it usually stops on its own shortly after it starts.  Persistent atrial fibrillation. This type often lasts longer than a week. It may stop on its own or with treatment.  Long-lasting persistent atrial fibrillation. This type lasts longer than 12 months.  Permanent atrial fibrillation. This type does not go away. Talk with your health care provider to learn about the type of atrial fibrillation that you have. CAUSES This condition is caused by some heart-related conditions or procedures, including:  A heart attack.  Coronary artery disease.  Heart failure.  Heart valve conditions.  High blood pressure.  Inflammation of the sac that surrounds the heart (pericarditis).  Heart surgery.  Certain heart rhythm disorders, such as Wolf-Parkinson-White syndrome. Other causes include:  Pneumonia.  Obstructive sleep apnea.  Blockage of an artery in the lungs (pulmonary embolism, or PE).  Lung cancer.  Chronic lung disease.  Thyroid problems, especially if the thyroid is overactive (hyperthyroidism).  Caffeine.  Excessive alcohol use or illegal drug use.  Use of some medicines, including certain decongestants and diet pills. Sometimes,  the cause cannot be found. RISK FACTORS This condition is more likely to develop in:  People who are older in age.  People who smoke.  People who have diabetes mellitus.  People who are overweight (obese).  Athletes who exercise vigorously. SYMPTOMS Symptoms of this condition include:  A feeling that your heart is beating rapidly or irregularly.  A feeling of discomfort or pain in your chest.  Shortness of breath.  Sudden light-headedness or weakness.  Getting tired easily during exercise. In some cases, there are no symptoms. DIAGNOSIS Your health care provider may be able to detect atrial fibrillation when taking your pulse. If detected, this condition may be diagnosed with:  An electrocardiogram (ECG).  A Holter monitor test that records your heartbeat patterns over a 24-hour period.  Transthoracic echocardiogram (TTE) to evaluate how blood flows through your heart.  Transesophageal echocardiogram (TEE) to view more detailed images of your heart.  A stress test.  Imaging tests, such as a CT scan or chest X-ray.  Blood tests. TREATMENT The main goals of treatment are to prevent blood clots from forming and to keep your heart beating at a normal rate and rhythm. The type of treatment that you receive depends on many factors, such as your underlying medical conditions and how you feel when you are experiencing atrial fibrillation. This condition may be treated with:  Medicine to slow down the heart rate, bring the heart's rhythm back to normal, or prevent clots from forming.  Electrical cardioversion. This is a procedure that resets your heart's rhythm by delivering a controlled, low-energy shock to the heart through your skin.  Different types of ablation, such as catheter ablation, catheter ablation with pacemaker, or surgical ablation. These procedures destroy the heart tissues that send abnormal signals. When the pacemaker is used, it is placed under your skin to  help your heart beat in a regular rhythm. HOME CARE INSTRUCTIONS  Take over-the counter and prescription medicines only as told by your health care provider.  If your health care provider prescribed a blood-thinning medicine (anticoagulant), take it exactly as told. Taking too much blood-thinning medicine can cause bleeding.  If you do not take enough blood-thinning medicine, you will not have the protection that you need against stroke and other problems.  Do not use tobacco products, including cigarettes, chewing tobacco, and e-cigarettes. If you need help quitting, ask your health care provider.  If you have obstructive sleep apnea, manage your condition as told by your health care provider.  Do not drink alcohol.  Do not drink beverages that contain caffeine, such as coffee, soda, and tea.  Maintain a healthy weight. Do not use diet pills unless your health care provider approves. Diet pills may make heart problems worse.  Follow diet instructions as told by your health care provider.  Exercise regularly as told by your health care provider.  Keep all follow-up visits as told by your health care provider. This is important. PREVENTION  Avoid drinking beverages that contain caffeine or alcohol.  Avoid certain medicines, especially medicines that are used for breathing problems.  Avoid certain herbs and herbal medicines, such as those that contain ephedra or ginseng.  Do not use illegal drugs, such as cocaine and amphetamines.  Do not smoke.  Manage your high blood pressure. SEEK MEDICAL CARE IF:  You notice a change in the rate, rhythm, or strength of your heartbeat.  You are taking an anticoagulant and you notice increased bruising.  You tire more easily when you exercise or exert yourself. SEEK IMMEDIATE MEDICAL CARE IF:  You have chest pain, abdominal pain, sweating, or weakness.  You feel nauseous.  You notice blood in your vomit, bowel movement, or urine.  You  have shortness of breath.  You suddenly have swollen feet and ankles.  You feel dizzy.  You have sudden weakness or numbness of the face, arm, or leg, especially on one side of the body.  You have trouble speaking, trouble understanding, or both (aphasia).  Your face or your eyelid droops on one side. These symptoms may represent a serious problem that is an emergency. Do not wait to see if the symptoms will go away. Get medical help right away. Call your local emergency services (911 in the [place] Do not drive yourself to the hospital.   This information is not intended to replace advice given to you by your health care provider. Make sure you discuss any questions you have with your health care provider.   Document Released: 02/18/2005 Document Revised: 11/09/2014 Document Reviewed: 06/15/2014 Elsevier Interactive Patient Education [employer].   Diverticulosis Diverticulosis is the condition that develops when small pouches (diverticula) form in the wall of your colon. Your colon, or large intestine, is where water is absorbed and stool is formed. The pouches form when the inside layer of your colon pushes through weak spots in the outer layers of your colon. CAUSES  No one knows exactly what causes diverticulosis. RISK FACTORS  Being older than 72. Your risk for this condition increases with age. Diverticulosis is rare in people younger than 40 years. By age 54, almost everyone has it.  Eating a low-fiber diet.  Being frequently constipated.  Being overweight.  Not getting enough exercise.  Smoking.  Taking over-the-counter pain medicines, like aspirin and ibuprofen. SYMPTOMS  Most people with diverticulosis do not have symptoms. DIAGNOSIS  Because diverticulosis often has no symptoms, health care providers often discover the condition during an exam for other colon problems. In many cases, a health care provider will diagnose diverticulosis while using a flexible  scope to examine the colon (colonoscopy). TREATMENT  If you have never  developed an infection related to diverticulosis, you may not need treatment. If you have had an infection before, treatment may include:  Eating more fruits, vegetables, and grains.  Taking a fiber supplement.  Taking a live bacteria supplement (probiotic).  Taking medicine to relax your colon. HOME CARE INSTRUCTIONS   Drink at least 6-8 glasses of water each day to prevent constipation.  Try not to strain when you have a bowel movement.  Keep all follow-up appointments. If you have had an infection before:  Increase the fiber in your diet as directed by your health care provider or dietitian.  Take a dietary fiber supplement if your health care provider approves.  Only take medicines as directed by your health care provider. SEEK MEDICAL CARE IF:   You have abdominal pain.  You have bloating.  You have cramps.  You have not gone to the bathroom in 3 days. SEEK IMMEDIATE MEDICAL CARE IF:   Your pain gets worse.  Yourbloating becomes very bad.  You have a fever or chills, and your symptoms suddenly get worse.  You begin vomiting.  You have bowel movements that are bloody or black. MAKE SURE YOU:  Understand these instructions.  Will watch your condition.  Will get help right away if you are not doing well or get worse.   This information is not intended to replace advice given to you by your health care provider. Make sure you discuss any questions you have with your health care provider.   Document Released: 11/16/2003 Document Revised: 02/23/2013 Document Reviewed: 01/13/2013 Elsevier Interactive Patient Education [employer].

## 2015-05-07 NOTE — Progress Notes (Signed)
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 Active Problems:   Asthma, moderate persistent   Diverticulosis of colon without hemorrhage   Atrial fibrillation with RVR (HCC)   Abdominal pain, left lower quadrant   Colonic obstruction (HCC)   Pre-operative cardiovascular examination   3 Days Post-Op    POST-OPERATIVE DIAGNOSIS: Rectosigmoid colonic stricture with partial colonic obstrcution  PROCEDURE:   LAPAROSCOPIC LOW ANTERIOR RESECTION  SPLENIC FLEXURE MOBILIZATION LAPAROSCOPIC LYSIS OF ADHESIONS RIGID PROCTOSCOPY  SURGEON: Surgeon(s): Elizabeth Boston, MD  ASSISTANT: RN      Partial colon obstruction most likely from end-stage diverticulitis w stricture  Gradually recovering - ileus resolving  Plan:  -continue diet per protocol = solids  -d/c drain  -F/U pathology  -OK of IV fluids.  PRN fluid boluses  Discussed with cardiology.  Dr. Marlou Mcconnell hoping patient can leave soon.  I think that is reasonable at this point.  -VTE prophylaxis- SCDs, etc  -mobilize as tolerated to help recovery.  GET HER UP!!!  Elizabeth Mcconnell, M.D., F.A.C.S. Gastrointestinal and Minimally Invasive Surgery [facility] [address] [place], [place] [address] [phone] Main / Paging   05/07/2015  Subjective:  Popping in/out A fib - Dr Elizabeth Mcconnell aware but rate controlled so not major concern Tol solids Felt wiped out last night  Objective:  Vital signs:  Filed Vitals:   05/06/15 1455 05/06/15 2113 05/07/15 0308 05/07/15 0609  BP: 122/54 133/56 122/75 139/72  Pulse: 71 74 88 106  Temp: 98.5 F (36.9 C) 98.1 F (36.7 C) 98.5 F (36.9 C) 98.8 F (37.1 C)  TempSrc: Oral  Oral Oral Oral  Resp: 18 18 20 20   Height:      Weight:    63.776 kg (140 lb 9.6 oz)  SpO2: 91% 92% 93% 90%    Last BM Date: 05/06/15  Intake/Output   Yesterday:  03/04 0701 - 03/05 0700 In: 480 [P.O.:480] Out: 1228 [Urine:1100; Drains:128] This shift:     Bowel function:  Flatus: y  BM: yes  Drain: sero-serosanguinous  Physical Exam:  General: Pt awake/alert/oriented x4 in no acute distress Eyes: PERRL, normal EOM.  Sclera clear.  No icterus Neuro: CN II-XII intact w/o focal sensory/motor deficits. Lymph: No head/neck/groin lymphadenopathy Psych:  No delerium/psychosis/paranoia HENT: Normocephalic, Mucus membranes moist.  No thrush Neck: Supple, No tracheal deviation Chest: No chest wall pain w good excursion CV:  Pulses intact.  Regular rhythm MS: Normal AROM mjr joints.  No obvious deformity Abdomen: Soft.  Obese.   Nondistended.  Min tender at Pfannenstiel incision.  Mod ecchymosis but stable.  Dressing removed again by me -  I removed wicks yesterday.  Blake drain stripped of old clots - thin now.  .  No evidence of peritonitis.  No incarcerated hernias. Ext:  SCDs BLE.  No mjr edema.  No cyanosis Skin: No petechiae / purpura  Results:   Labs: Results for orders placed or performed during the hospital encounter of 05/01/15 (from the past 48 hour(s))  Glucose, capillary     Status: Abnormal   Collection Time: 05/05/15 11:45 AM  Result Value Ref Range   Glucose-Capillary 132 (H) 65 - 99 mg/dL  Hemoglobin and hematocrit, blood     Status: Abnormal  Collection Time: 05/05/15  1:48 PM  Result Value Ref Range   Hemoglobin 9.4 (L) 12.0 - 15.0 g/dL   HCT 29.2 (L) 36.0 - 46.0 %  Glucose, capillary     Status: Abnormal   Collection Time: 05/05/15  6:27 PM  Result Value Ref Range   Glucose-Capillary 118 (H) 65 - 99 mg/dL  Heparin level (unfractionated)     Status: Abnormal   Collection Time: 05/05/15  9:15 PM  Result Value Ref Range   Heparin Unfractionated 0.19  (L) 0.30 - 0.70 IU/mL    Comment:        IF HEPARIN RESULTS ARE BELOW EXPECTED VALUES, AND PATIENT DOSAGE HAS BEEN CONFIRMED, SUGGEST FOLLOW UP TESTING OF ANTITHROMBIN III LEVELS.   Glucose, capillary     Status: Abnormal   Collection Time: 05/05/15 11:54 PM  Result Value Ref Range   Glucose-Capillary 114 (H) 65 - 99 mg/dL  CBC     Status: Abnormal   Collection Time: 05/06/15  6:06 AM  Result Value Ref Range   WBC 15.4 (H) 4.0 - 10.5 K/uL   RBC 3.11 (L) 3.87 - 5.11 MIL/uL   Hemoglobin 9.3 (L) 12.0 - 15.0 g/dL   HCT 29.0 (L) 36.0 - 46.0 %   MCV 93.2 78.0 - 100.0 fL   MCH 29.9 26.0 - 34.0 pg   MCHC 32.1 30.0 - 36.0 g/dL   RDW 14.4 11.5 - 15.5 %   Platelets 186 150 - 400 K/uL  Comprehensive metabolic panel     Status: Abnormal   Collection Time: 05/06/15  6:06 AM  Result Value Ref Range   Sodium 145 135 - 145 mmol/L   Potassium 3.6 3.5 - 5.1 mmol/L   Chloride 111 101 - 111 mmol/L   CO2 26 22 - 32 mmol/L   Glucose, Bld 97 65 - 99 mg/dL   BUN <5 (L) 6 - 20 mg/dL   Creatinine, Ser 0.65 0.44 - 1.00 mg/dL   Calcium 8.3 (L) 8.9 - 10.3 mg/dL   Total Protein 5.6 (L) 6.5 - 8.1 g/dL   Albumin 3.2 (L) 3.5 - 5.0 g/dL   AST 21 15 - 41 U/L   ALT 27 14 - 54 U/L   Alkaline Phosphatase 48 38 - 126 U/L   Total Bilirubin 0.3 0.3 - 1.2 mg/dL   GFR calc non Af Amer >60 >60 mL/min   GFR calc Af Amer >60 >60 mL/min    Comment: (NOTE) The eGFR has been calculated using the CKD EPI equation. This calculation has not been validated in all clinical situations. eGFR's persistently <60 mL/min signify possible Chronic Kidney Disease.    Anion gap 8 5 - 15  Magnesium     Status: None   Collection Time: 05/06/15  6:06 AM  Result Value Ref Range   Magnesium 1.9 1.7 - 2.4 mg/dL  Heparin level (unfractionated)     Status: None   Collection Time: 05/06/15  6:06 AM  Result Value Ref Range   Heparin Unfractionated 0.54 0.30 - 0.70 IU/mL    Comment:        IF HEPARIN RESULTS ARE BELOW EXPECTED  VALUES, AND PATIENT DOSAGE HAS BEEN CONFIRMED, SUGGEST FOLLOW UP TESTING OF ANTITHROMBIN III LEVELS.   Glucose, capillary     Status: Abnormal   Collection Time: 05/06/15  6:14 AM  Result Value Ref Range   Glucose-Capillary 111 (H) 65 - 99 mg/dL  Glucose, capillary     Status: Abnormal   Collection Time: 05/06/15  11:43 AM  Result Value Ref Range   Glucose-Capillary 127 (H) 65 - 99 mg/dL  Glucose, capillary     Status: None   Collection Time: 05/06/15  6:05 PM  Result Value Ref Range   Glucose-Capillary 97 65 - 99 mg/dL  Glucose, capillary     Status: Abnormal   Collection Time: 05/06/15 11:59 PM  Result Value Ref Range   Glucose-Capillary 105 (H) 65 - 99 mg/dL  Glucose, capillary     Status: Abnormal   Collection Time: 05/07/15  6:06 AM  Result Value Ref Range   Glucose-Capillary 101 (H) 65 - 99 mg/dL  CBC     Status: Abnormal   Collection Time: 05/07/15  6:11 AM  Result Value Ref Range   WBC 13.2 (H) 4.0 - 10.5 K/uL   RBC 3.46 (L) 3.87 - 5.11 MIL/uL   Hemoglobin 10.3 (L) 12.0 - 15.0 g/dL   HCT 31.6 (L) 36.0 - 46.0 %   MCV 91.3 78.0 - 100.0 fL   MCH 29.8 26.0 - 34.0 pg   MCHC 32.6 30.0 - 36.0 g/dL   RDW 14.0 11.5 - 15.5 %   Platelets 198 150 - 400 K/uL  Basic metabolic panel     Status: Abnormal   Collection Time: 05/07/15  6:11 AM  Result Value Ref Range   Sodium 144 135 - 145 mmol/L   Potassium 3.5 3.5 - 5.1 mmol/L   Chloride 109 101 - 111 mmol/L   CO2 26 22 - 32 mmol/L   Glucose, Bld 103 (H) 65 - 99 mg/dL   BUN <5 (L) 6 - 20 mg/dL   Creatinine, Ser 0.65 0.44 - 1.00 mg/dL   Calcium 8.6 (L) 8.9 - 10.3 mg/dL   GFR calc non Af Amer >60 >60 mL/min   GFR calc Af Amer >60 >60 mL/min    Comment: (NOTE) The eGFR has been calculated using the CKD EPI equation. This calculation has not been validated in all clinical situations. eGFR's persistently <60 mL/min signify possible Chronic Kidney Disease.    Anion gap 9 5 - 15  Magnesium     Status: None   Collection Time:  05/07/15  6:11 AM  Result Value Ref Range   Magnesium 2.0 1.7 - 2.4 mg/dL    Imaging / Studies: No results found.  Medications / Allergies: per chart  Antibiotics: Anti-infectives    Start     Dose/Rate Route Frequency Ordered Stop   05/04/15 2200  cefoTEtan (CEFOTAN) 2 g in dextrose 5 % 50 mL IVPB     2 g 100 mL/hr over 30 Minutes Intravenous Every 12 hours 05/04/15 1557 05/04/15 2326   05/04/15 1045  clindamycin (CLEOCIN) 900 mg, gentamicin (GARAMYCIN) 240 mg in sodium chloride 0.9 % 1,000 mL for intraperitoneal lavage      Intraperitoneal To Surgery 05/04/15 1038 05/04/15 1337   05/04/15 0600  cefoTEtan in Dextrose 5% (CEFOTAN) 2 g IVPB  Status:  Discontinued     2 g over 30 Minutes Intravenous On call to O.R. 05/02/15 1542 05/04/15 0714   05/03/15 1300  neomycin (MYCIFRADIN) tablet 1,000 mg     1,000 mg Oral 3 times per day on Wed 05/02/15 1609 05/03/15 2231   05/02/15 1615  neomycin (MYCIFRADIN) tablet 1,000 mg  Status:  Discontinued     1,000 mg Oral 3 times per day on Tue 05/02/15 1556 05/02/15 1609   05/02/15 0600  piperacillin-tazobactam (ZOSYN) IVPB 3.375 g  Status:  Discontinued  3.375 g 12.5 mL/hr over 240 Minutes Intravenous Every 8 hours 05/02/15 0553 05/04/15 1557        Note: Portions of this report may have been transcribed using voice recognition software. Every effort was made to ensure accuracy; however, inadvertent computerized transcription errors may be present.   Any transcriptional errors that result from this process are unintentional.     Elizabeth Mcconnell, M.D., F.A.C.S. Gastrointestinal and Minimally Invasive Surgery [facility] [address] [place], [place] [address] [phone] Main / Paging   05/07/2015  CARE TEAM:  PCP: [name], MD  Outpatient Care Team: Patient Care Team: Janith Lima, MD as PCP - General (Internal Medicine) Elsie Stain, MD (Pulmonary Disease) Thompson Grayer, MD  (Cardiology) Sherren Mocha, MD (Cardiology) [name], MD (Gynecology) Erline Levine, MD (Neurosurgery) Lafayette Dragon, MD (Gastroenterology)  Inpatient Treatment Team: Treatment Team: Attending Provider: Nat Math, MD; Consulting Physician: Nolon Nations, MD; Consulting Physician: Michae Kava Lbcardiology, MD; Registered Nurse: Kathleen Argue, RN; Rounding Team: Threasa Beards, MD; Technician: Geoffery Spruce, NT; Registered Nurse: Rosine Beat, RN; Technician: Desma Mcgregor; Student Nurse: Phebe Colla, Student-RN; Technician: Leona Carry, NT; Technician: Ennis Forts, NT; Registered Nurse: Rondell Reams, RN; Technician: Annette Stable, NT; Registered Nurse: Bobbye Charleston, RN

## 2015-05-07 NOTE — Progress Notes (Signed)
PHARMACIST - PHYSICIAN ORDER COMMUNICATION  CONCERNING: P&T Medication Policy on Herbal Medications  DESCRIPTION:  This patient's order for:  Melatonin  has been noted.  This product(s) is classified as an "herbal" or natural product. Due to a lack of definitive safety studies or FDA approval, nonstandard manufacturing practices, plus the potential risk of unknown drug-drug interactions while on inpatient medications, the Pharmacy and Therapeutics Committee does not permit the use of "herbal" or natural products of this type within [facility].   ACTION TAKEN: The pharmacy department is unable to verify this order at this time and your patient has been informed of this safety policy. Please reevaluate patient's clinical condition at discharge and address if the herbal or natural product(s) should be resumed at that time.  In addition: Tylenol CR ordered, not available in system Order discontinued by [facility] MD  Thank you,  Minda Ditto PharmD Pager [phone] 05/07/2015, 9:40 AM

## 2015-05-07 NOTE — Progress Notes (Addendum)
Per central monitoring, pt had 3.27 second pause. HR currently in 80-90s. Cardiology on-call notified, received call back. No new orders given. Will continue to monitor.

## 2015-05-07 NOTE — Progress Notes (Signed)
[facility] PROGRESS NOTE  Elizabeth Mcconnell [id number] DOB: 31-Dec-1945 DOA: 05/01/2015 PCP: [name], MD  Summary 05/03/15: Appreciate cardiology/general surgery. I have seen and examined Elizabeth Mcconnell at bedside and reviewed her chart. Elizabeth Mcconnell is a 70 y.o. female with history of persistent atrial fibrillation, atrial flutter status post ablation now on Tikosyn, bronchial asthma who was referred to the ER after patient's CAT scan showed colonic obstruction(?related to end stage diverticulitis) and she is to have laparoscopic colectomy with possible ostomy tomorrow. Her electrolytes are off, including hypokalemia/hypomagnesemia and these were replenished and will need to be closely monitored. Patient is on IV heparin and off Eliquis perioperatively. She denies any complaints. 05/04/15: Postop. Drowsy. Potassium 3.7. Will replenish. Continue Tikosyn, and follow cardiology/general surgery recommendations. 05/05/15: Appreciate general surgery/cardiology. Postop day #1. Patient more with it. Passing gas/bowel movement, ate earlier today, magnesium remains low, she is on heparin drip/Tikosyn. She complains of chills but she has no fever. We will continue to replenish electrolytes, watch for infection, follow general surgery/cardiology recommendations. 05/06/15: Appreciate general surgery/cardiology. Postop day#2 laparoscopic low anterior resection, splenic flexure mobilization, laparoscopic lysis of adhesions, rigid proctoscopy, doing well, tolerating feeding. Patient now on Eliquis. Magnesium low. 05/07/15: Appreciate general surgery/cardiology. Patient has leukocytosis but this may be reactionary. She had diarrhea after eating this afternoon. We'll continue to monitor, home when okay with general surgery. Plan Colonic obstruction (HCC)/ Diverticulosis of colon without hemorrhage/Abdominal pain, left lower quadrant/Stricture of sigmoid colon with obstruction- laparoscopic low anterior  resection, splenic flexure mobilization, laparoscopic lysis of adhesions, rigid proctoscopy  Doing well  Defer management to general surgery Atrial fibrillation with RVR (HCC)/Pre-operative cardiovascular examination/hypokalemia/hypomagnesemia  Appreciate cardiology  Continue Tikosyn  Replenish potassium/magnesium as needed   Back on Eliquis Asthma, moderate persistent  No evidence of flare up  Monitor Leukocytosis  No evidence of infection  Monitor Code Status: Full Code Family Communication: Husband at bedside. Disposition Plan: Probably home in the next 24-48 hours when it is okay with general surgery, and no other adverse developments   Consultants:  Cardiology  General surgery  Procedures:  Laparoscopic low anterior resection, splenic flexure mobilization, laparoscopic lysis of adhesions, rigid proctoscopy  Antibiotics:   HPI/Subjective: Sleepy. Wants to shower. Abdominal pain manageable. Had diarrhea after lunch.  Objective: Filed Vitals:   05/07/15 0609 05/07/15 1405  BP: 139/72 100/58  Pulse: 106 98  Temp: 98.8 F (37.1 C) 97.8 F (36.6 C)  Resp: 20 20    Intake/Output Summary (Last 24 hours) at 05/07/15 1813 Last data filed at 05/07/15 0935  Gross per 24 hour  Intake    420 ml  Output     85 ml  Net    335 ml   Filed Weights   05/05/15 0539 05/06/15 0639 05/07/15 0609  Weight: 70.081 kg (154 lb 8 oz) 68.992 kg (152 lb 1.6 oz) 63.776 kg (140 lb 9.6 oz)    Exam:   General:  Comfortable at rest.  Cardiovascular: S1-S2 normal. No murmurs. Pulse regular.  Respiratory: Good air entry bilaterally. No rhonchi or rales.  Abdomen: Soft and nontender. Normal bowel sounds. No organomegaly.  Musculoskeletal: No pedal edema   Neurological: Intact  Data Reviewed: Basic Metabolic Panel:  Recent Labs Lab 05/03/15 0204 05/04/15 0521 05/05/15 0541 05/06/15 0606 05/07/15 0611  NA 140 143 140 145 144  K 3.4* 3.7 4.3 3.6 3.5  CL  113* 116* 110 111 109  CO2 19* 20* 18* 26 26  GLUCOSE 98 88 142*  97 103*  BUN 5* <5* <5* <5* <5*  CREATININE 0.71 0.74 0.83 0.65 0.65  CALCIUM 8.2* 8.2* 8.5* 8.3* 8.6*  MG 1.8 2.0 1.6* 1.9 2.0   Liver Function Tests:  Recent Labs Lab 05/01/15 1915 05/02/15 0436 05/04/15 0521 05/06/15 0606  AST 33 23 30 21   ALT 27 20 31 27   ALKPHOS 76 54 54 48  BILITOT 1.0 1.1 0.3 0.3  PROT 7.3 5.6* 5.8* 5.6*  ALBUMIN 4.4 3.4* 3.5 3.2*    Recent Labs Lab 05/01/15 1915  LIPASE 34   No results for input(s): AMMONIA in the last 168 hours. CBC:  Recent Labs Lab 05/02/15 0436 05/03/15 2120 05/04/15 0521 05/05/15 0541 05/05/15 1348 05/06/15 0606 05/07/15 0611  WBC 9.6 8.7 6.4  --   --  15.4* 13.2*  NEUTROABS 6.0  --   --   --   --   --   --   HGB 11.6* 11.4* 10.6* 9.3* 9.4* 9.3* 10.3*  HCT 35.1* 35.3* 32.3* 28.9* 29.2* 29.0* 31.6*  MCV 90.5 91.9 90.5  --   --  93.2 91.3  PLT 216 220 192  --   --  186 198   Cardiac Enzymes: No results for input(s): CKTOTAL, CKMB, CKMBINDEX, TROPONINI in the last 168 hours. BNP (last 3 results) No results for input(s): BNP in the last 8760 hours.  ProBNP (last 3 results) No results for input(s): PROBNP in the last 8760 hours.  CBG:  Recent Labs Lab 05/06/15 1805 05/06/15 2359 05/07/15 0606 05/07/15 1213 05/07/15 1757  GLUCAP 97 105* 101* 82 106*    Recent Results (from the past 240 hour(s))  Surgical pcr screen     Status: None   Collection Time: 05/04/15  5:14 AM  Result Value Ref Range Status   MRSA, PCR NEGATIVE NEGATIVE Final   Staphylococcus aureus NEGATIVE NEGATIVE Final    Comment:        The Xpert SA Assay (FDA approved for NASAL specimens in patients over 43 years of age), is one component of a comprehensive surveillance program.  Test performance has been validated by [facility] for patients greater than or equal to 36 year old. It is not intended to diagnose infection nor to guide or monitor treatment.       Studies: No results found.  Scheduled Meds: . acetaminophen  1,000 mg Oral TID  . apixaban  5 mg Oral BID  . diltiazem  300 mg Oral QHS  . dofetilide  250 mcg Oral BID  . estradiol  0.05 mg Transdermal Weekly  . feeding supplement  1 Container Oral BID BM  . fluticasone  2 spray Each Nare Daily  . Linaclotide  145 mcg Oral Daily  . magnesium oxide  400 mg Oral Daily  . mometasone-formoterol  2 puff Inhalation BID  . montelukast  10 mg Oral Daily  . potassium chloride  40 mEq Oral BID  . saccharomyces boulardii  250 mg Oral BID  . sodium chloride flush  3 mL Intravenous Q12H  . sodium chloride flush  3 mL Intravenous Q12H   Continuous Infusions:    Time spent: 25 minutes    Lonzie Simmer  [facility] Pager [phone]. If 7PM-7AM, please contact night-coverage at [email or web address], password [facility] 05/07/2015, 6:13 PM  LOS: 6 days

## 2015-05-07 NOTE — Progress Notes (Signed)
Received call from central monitoring that pt had converted from afib to normal sinus rhythm. L.[place]uk paged, placed order to obtain EKG. Will follow orders and continue to monitor.

## 2015-05-08 LAB — COMPREHENSIVE METABOLIC PANEL
ALT: 19 U/L (ref 14–54)
AST: 15 U/L (ref 15–41)
Albumin: 2.8 g/dL — ABNORMAL LOW (ref 3.5–5.0)
Alkaline Phosphatase: 53 U/L (ref 38–126)
Anion gap: 7 (ref 5–15)
BUN: 7 mg/dL (ref 6–20)
CO2: 24 mmol/L (ref 22–32)
Calcium: 8.5 mg/dL — ABNORMAL LOW (ref 8.9–10.3)
Chloride: 107 mmol/L (ref 101–111)
Creatinine, Ser: 0.59 mg/dL (ref 0.44–1.00)
GFR calc Af Amer: >60 mL/min (ref >60)
GFR calc non Af Amer: >60 mL/min (ref >60)
Glucose, Bld: 91 mg/dL (ref 65–99)
Potassium: 4.1 mmol/L (ref 3.5–5.1)
Sodium: 138 mmol/L (ref 135–145)
Total Bilirubin: 0.4 mg/dL (ref 0.3–1.2)
Total Protein: 5.5 g/dL — ABNORMAL LOW (ref 6.5–8.1)

## 2015-05-08 LAB — CBC WITH DIFFERENTIAL/PLATELET
Basophils Absolute: 0 10*3/uL (ref 0.0–0.1)
Basophils Relative: 0 %
Eosinophils Absolute: 0.3 10*3/uL (ref 0.0–0.7)
Eosinophils Relative: 3 %
HCT: 32.2 % — ABNORMAL LOW (ref 36.0–46.0)
Hemoglobin: 10.3 g/dL — ABNORMAL LOW (ref 12.0–15.0)
Lymphocytes Relative: 20 %
Lymphs Abs: 2.1 10*3/uL (ref 0.7–4.0)
MCH: 29.6 pg (ref 26.0–34.0)
MCHC: 32 g/dL (ref 30.0–36.0)
MCV: 92.5 fL (ref 78.0–100.0)
Monocytes Absolute: 0.7 10*3/uL (ref 0.1–1.0)
Monocytes Relative: 7 %
Neutro Abs: 7.5 10*3/uL (ref 1.7–7.7)
Neutrophils Relative %: 70 %
Platelets: 210 10*3/uL (ref 150–400)
RBC: 3.48 MIL/uL — ABNORMAL LOW (ref 3.87–5.11)
RDW: 14.2 % (ref 11.5–15.5)
WBC: 10.7 10*3/uL — ABNORMAL HIGH (ref 4.0–10.5)

## 2015-05-08 LAB — MAGNESIUM: Magnesium: 1.9 mg/dL (ref 1.7–2.4)

## 2015-05-08 LAB — PHOSPHORUS: Phosphorus: 2.8 mg/dL (ref 2.5–4.6)

## 2015-05-08 MED ORDER — DOCUSATE SODIUM 100 MG PO CAPS: 100.0000 mg | ORAL_CAPSULE | Freq: Two times a day (BID) | ORAL | Status: DC | PRN

## 2015-05-08 NOTE — Care Management Important Message (Signed)
Important Message  Patient Details  Name: Elizabeth Mcconnell MRN: [id number] Date of Birth: May 30, 1945   Medicare Important Message Given:  Yes    Camillo Flaming 05/08/2015, 3:34 [place] Message  Patient Details  Name: Elizabeth Mcconnell MRN: [id number] Date of Birth: 1945-04-25   Medicare Important Message Given:  Yes    Camillo Flaming 05/08/2015, 3:33 PM

## 2015-05-08 NOTE — Progress Notes (Signed)
[facility] Progress Note  4 Days Post-Op  Subjective: Pt doing well, pain well controlled.  No N/V.  Tolerating soft diet.  Ambulating well. Having lots of loose BM's and flatus.  Wants to shower.    Objective: Vital signs in last 24 hours: Temp:  [97.8 F (36.6 C)-98.2 F (36.8 C)] 98.2 F (36.8 C) (03/06 0527) Pulse Rate:  [71-98] 75 (03/06 0914) Resp:  [18-20] 18 (03/06 0914) BP: (100-121)/(55-59) 121/55 mmHg (03/06 0527) SpO2:  [94 %-96 %] 96 % (03/06 0914) Weight:  [64.864 kg (143 lb)] 64.864 kg (143 lb) (03/06 0527) Last BM Date: 05/08/15  Intake/Output from previous day: 03/05 0701 - 03/06 0700 In: 420 [P.O.:420] Out: -  Intake/Output this shift:    PE: Gen:  Alert, NAD, pleasant Abd: Soft, ND, minimal tenderness over her lower incision site, +BS, no HSM, incisions C/D/I, drain removed, serous drainage from drain site  Lab Results:   Recent Labs  05/07/15 0611 05/08/15 0527  WBC 13.2* 10.7*  HGB 10.3* 10.3*  HCT 31.6* 32.2*  PLT 198 210   BMET  Recent Labs  05/07/15 0611 05/08/15 0527  NA 144 138  K 3.5 4.1  CL 109 107  CO2 26 24  GLUCOSE 103* 91  BUN <5* 7  CREATININE 0.65 0.59  CALCIUM 8.6* 8.5*   PT/INR No results for input(s): LABPROT, INR in the last 72 hours. CMP     Component Value Date/Time   NA 138 05/08/2015 0527   K 4.1 05/08/2015 0527   CL 107 05/08/2015 0527   CO2 24 05/08/2015 0527   GLUCOSE 91 05/08/2015 0527   BUN 7 05/08/2015 0527   CREATININE 0.59 05/08/2015 0527   CREATININE 0.80 02/22/2015 1527   CALCIUM 8.5* 05/08/2015 0527   PROT 5.5* 05/08/2015 0527   ALBUMIN 2.8* 05/08/2015 0527   AST 15 05/08/2015 0527   ALT 19 05/08/2015 0527   ALKPHOS 53 05/08/2015 0527   BILITOT 0.4 05/08/2015 0527   GFRNONAA >60 05/08/2015 0527   GFRAA >60 05/08/2015 0527   Lipase     Component Value Date/Time   LIPASE 34 05/01/2015 1915       Studies/Results: No results  found.  Anti-infectives: Anti-infectives    Start     Dose/Rate Route Frequency Ordered Stop   05/04/15 2200  cefoTEtan (CEFOTAN) 2 g in dextrose 5 % 50 mL IVPB     2 g 100 mL/hr over 30 Minutes Intravenous Every 12 hours 05/04/15 1557 05/04/15 2326   05/04/15 1045  clindamycin (CLEOCIN) 900 mg, gentamicin (GARAMYCIN) 240 mg in sodium chloride 0.9 % 1,000 mL for intraperitoneal lavage      Intraperitoneal To Surgery 05/04/15 1038 05/04/15 1337   05/04/15 0600  cefoTEtan in Dextrose 5% (CEFOTAN) 2 g IVPB  Status:  Discontinued     2 g over 30 Minutes Intravenous On call to O.R. 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 POD #4 s/p Laparoscopic low anterior resection, splenic flexure mobilization, LOA, rigid proctoscopy -Tolerating soft diet, continue at discharge -Ambulate and IS -SCD's and Eliquis -No antibiotics needed at discharge -Okay to shower, just cover drain site with water proof band-aid -Drain is out -Discontinue linzess - she was taking this for GI symptoms likely related to her stricture so will not likely need this at discharge -Discussed post-op care and follow up with Dr. Johney Maine in 2-3 weeks for a post-op check  ABL Anemia -Hgb down to 10.3 today AFIB with RVR, A flutter -Eliquis -Appreciate cards recs Asthma H/o spontaneous pneumothorax s/p LLL resection HTN Tobacco abuse Disp - okay for discharge from our perspective when  medically stable    LOS: 7 days    Nat Christen 05/08/2015, 9:20 AM Pager: [phone]  (7am - 4:30pm M-F; 7am - 11:30am Sa/Su)

## 2015-05-08 NOTE — Progress Notes (Signed)
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)   Pre-operative cardiovascular examination   Primary Cardiologist: Dr Burt Knack, Dr Allred  Patient Profile: 70 yo female w/ hx of PAFib/flutter s/p ablation (2014) on Tikosyn and Eliquis, admitted 05/01/15 w/ SBO. S/P laparoscopic low anterior resection and lysis of adhesions for a rectosigmoid colonic stricture with partial colonic obstruction 05/04/15. Cards following for PAF  SUBJECTIVE: Feels very tired and symptomatic when in afib. Feels tired, but better in SR.  OBJECTIVE Filed Vitals:   05/07/15 1405 05/07/15 1952 05/07/15 2122 05/08/15 0527  BP: 100/58  120/59 121/55  Pulse: 98  80 71  Temp: 97.8 F (36.6 C)  98 F (36.7 C) 98.2 F (36.8 C)  TempSrc: Oral  Oral Oral  Resp: 20  20 20   Height:      Weight:    143 lb (64.864 kg)  SpO2: 95% 95% 94% 94%    Intake/Output Summary (Last 24 hours) at 05/08/15 0855 Last data filed at 05/07/15 2300  Gross per 24 hour  Intake    420 ml  Output      0 ml  Net    420 ml   Filed Weights   05/06/15 0639 05/07/15 0609 05/08/15 0527  Weight: 152 lb 1.6 oz (68.992 kg) 140 lb 9.6 oz (63.776 kg) 143 lb (64.864 kg)    PHYSICAL EXAM General: Well developed, well nourished, female in no acute distress. Head: Normocephalic, atraumatic.  Neck: Supple without bruits, JVD not elevated. Lungs:  Resp regular and unlabored, rales and slight wheeze bases. Heart: RRR, S1, S2, no S3, S4, or murmur; no rub. Abdomen: Soft, tender, non-distended, BS + x 4.  Extremities: No clubbing, cyanosis, edema.  Neuro: Alert and oriented X 3. Moves all extremities spontaneously. Psych: Normal  affect.  LABS: CBC: Recent Labs  05/07/15 0611 05/08/15 0527  WBC 13.2* 10.7*  NEUTROABS  --  7.5  HGB 10.3* 10.3*  HCT 31.6* 32.2*  MCV 91.3 92.5  PLT 198 [id number]   Basic Metabolic Panel: Recent Labs  05/07/15 0611 05/08/15 0527  NA 144 138  K 3.5 4.1  CL 109 107  CO2 26 24  GLUCOSE 103* 91  BUN <5* 7  CREATININE 0.65 0.59  CALCIUM 8.6* 8.5*  MG 2.0 1.9  PHOS  --  2.8   Liver Function Tests: Recent Labs  05/06/15 0606 05/08/15 0527  AST 21 15  ALT 27 19  ALKPHOS 48 53  BILITOT 0.3 0.4  PROT 5.6* 5.5*  ALBUMIN 3.2* 2.8*    TELE: SR, 2 episodes > 1 hr of atrial fib w/in 24 HR, some atrial flutter also       Current Medications:  . acetaminophen  1,000 mg Oral TID  . apixaban  5 mg Oral BID  . diltiazem  300 mg Oral QHS  . dofetilide  250 mcg Oral BID  . estradiol  0.05 mg Transdermal Weekly  . feeding supplement  1 Container Oral BID BM  . fluticasone  2 spray Each Nare Daily  . Linaclotide  145 mcg Oral Daily  . magnesium oxide  400 mg Oral Daily  . mometasone-formoterol  2 puff Inhalation BID  . montelukast  10 mg Oral Daily  . potassium chloride  40 mEq Oral BID  . saccharomyces boulardii  250 mg Oral BID  . sodium chloride flush  3 mL Intravenous Q12H  . 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   PAFib/flutter s/p ablation (2014): Had episodes of atrial fibrillation again overnight. Currently maintaining NSR with some PACs. PAF noted 3/3-3/4 over night as well. Converted spontaneously.   Patient was initially NPO so Tikosyn and Eliquis were held.  Restarted on Eliquis 5 BID 03/04.  She has a CHADSVASC score of 3 (HTN, age, female sex).  Tikosyn resumed 3/2 evening. She only missed 1 dose. ECG QTc 418ms 03/03, appears longer when in atrial fib/flutter.  She has demonstrated breakthrough paroxysmal atrial fibrillation  which is to be expected at times with inflammatory state surrounding surgery.  She is also on home diltiazem CD 300 mg daily for rate control. Symptomatic with her AFIB.  -Restarted on Mag Ox home dose.  - Electrolytes are OK  Chronic anticoagulation: Restarted Eliquis 05/06/15 Hemoglobin stable  HTN: Controlled. Continue diltiazem CD 300 mg daily.  MD advise if ok for dc from a cardiac perspective whenever surgery is ready.  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  Took shower this am  Missed a dose of tikosyn  In NSR this am Would watch in hospital today and d/c am if Hb stable and no further PAF.  She has a f/u appt with  Dr Rayann Heman next Wendsday.  Still not much appetite BS positive on exam.    Will write for Hb/Hct and ECG in am  Jenkins Rouge

## 2015-05-08 NOTE — Progress Notes (Signed)
[facility] PROGRESS NOTE  Elizabeth Mcconnell [id number] DOB: 1945/12/01 DOA: 05/01/2015 PCP: [name], MD  Summary 05/03/15: Appreciate cardiology/general surgery. I have seen and examined Elizabeth Mcconnell at bedside and reviewed her chart. Elizabeth Mcconnell is a 70 y.o. female with history of persistent atrial fibrillation, atrial flutter status post ablation now on Tikosyn, bronchial asthma who was referred to the ER after patient's CAT scan showed colonic obstruction(?related to end stage diverticulitis) and she is to have laparoscopic colectomy with possible ostomy tomorrow. Her electrolytes are off, including hypokalemia/hypomagnesemia and these were replenished and will need to be closely monitored. Patient is on IV heparin and off Eliquis perioperatively. She denies any complaints. 05/04/15: Postop. Drowsy. Potassium 3.7. Will replenish. Continue Tikosyn, and follow cardiology/general surgery recommendations. 05/05/15: Appreciate general surgery/cardiology. Postop day #1. Patient more with it. Passing gas/bowel movement, ate earlier today, magnesium remains low, she is on heparin drip/Tikosyn. She complains of chills but she has no fever. We will continue to replenish electrolytes, watch for infection, follow general surgery/cardiology recommendations. 05/06/15: Appreciate general surgery/cardiology. Postop day#2 laparoscopic low anterior resection, splenic flexure mobilization, laparoscopic lysis of adhesions, rigid proctoscopy, doing well, tolerating feeding. Patient now on Eliquis. Magnesium low. 05/07/15: Appreciate general surgery/cardiology. Patient has leukocytosis but this may be reactionary. She had diarrhea after eating this afternoon. We'll continue to monitor, home when okay with general surgery. 05/08/15: Appreciate general surgery/cardiology. Patient denies any complaints. Noted cardiology recommendations to observe patient overnight which will be done. Meanwhile, continue current  management. Plan Colonic obstruction (HCC)/ Diverticulosis of colon without hemorrhage/Abdominal pain, left lower quadrant/Stricture of sigmoid colon with obstruction- laparoscopic low anterior resection, splenic flexure mobilization, laparoscopic lysis of adhesions, rigid proctoscopy  No acute changes  Defer management to general surgery Atrial fibrillation with RVR (HCC)/Pre-operative cardiovascular examination/hypokalemia/hypomagnesemia  Appreciate cardiology  Continue Tikosyn  Replenish potassium/magnesium as needed   Continue Eliquis Asthma, moderate persistent  No evidence of flare up  Monitor Leukocytosis  No evidence of infection  Monitor Code Status: Full Code Family Communication: Husband at bedside. Disposition Plan: Probably home tomorrow   Consultants:  Cardiology  General surgery  Procedures:  Laparoscopic low anterior resection, splenic flexure mobilization, laparoscopic lysis of adhesions, rigid proctoscopy  Antibiotics:   HPI/Subjective: No complaints.  Objective: Filed Vitals:   05/08/15 0914 05/08/15 1422  BP:  105/55  Pulse: 75 74  Temp:  98.8 F (37.1 C)  Resp: 18 18    Intake/Output Summary (Last 24 hours) at 05/08/15 2016 Last data filed at 05/07/15 2300  Gross per 24 hour  Intake    240 ml  Output      0 ml  Net    240 ml   Filed Weights   05/06/15 0639 05/07/15 0609 05/08/15 0527  Weight: 68.992 kg (152 lb 1.6 oz) 63.776 kg (140 lb 9.6 oz) 64.864 kg (143 lb)    Exam:   General:  Comfortable at rest.  Cardiovascular: S1-S2 normal. No murmurs. Pulse regular.  Respiratory: Good air entry bilaterally. No rhonchi or rales.  Abdomen: Soft and nontender. Normal bowel sounds. No organomegaly.  Musculoskeletal: No pedal edema   Neurological: Intact  Data Reviewed: Basic Metabolic Panel:  Recent Labs Lab 05/04/15 0521 05/05/15 0541 05/06/15 0606 05/07/15 0611 05/08/15 0527  NA 143 140 145 144 138  K 3.7  4.3 3.6 3.5 4.1  CL 116* 110 111 109 107  CO2 20* 18* 26 26 24   GLUCOSE 88 142* 97 103* 91  BUN <5* <5* <  5* <5* 7  CREATININE 0.74 0.83 0.65 0.65 0.59  CALCIUM 8.2* 8.5* 8.3* 8.6* 8.5*  MG 2.0 1.6* 1.9 2.0 1.9  PHOS  --   --   --   --  2.8   Liver Function Tests:  Recent Labs Lab 05/02/15 0436 05/04/15 0521 05/06/15 0606 05/08/15 0527  AST 23 30 21 15   ALT 20 31 27 19   ALKPHOS 54 54 48 53  BILITOT 1.1 0.3 0.3 0.4  PROT 5.6* 5.8* 5.6* 5.5*  ALBUMIN 3.4* 3.5 3.2* 2.8*   No results for input(s): LIPASE, AMYLASE in the last 168 hours. No results for input(s): AMMONIA in the last 168 hours. CBC:  Recent Labs Lab 05/02/15 0436 05/03/15 2120 05/04/15 0521 05/05/15 0541 05/05/15 1348 05/06/15 0606 05/07/15 0611 05/08/15 0527  WBC 9.6 8.7 6.4  --   --  15.4* 13.2* 10.7*  NEUTROABS 6.0  --   --   --   --   --   --  7.5  HGB 11.6* 11.4* 10.6* 9.3* 9.4* 9.3* 10.3* 10.3*  HCT 35.1* 35.3* 32.3* 28.9* 29.2* 29.0* 31.6* 32.2*  MCV 90.5 91.9 90.5  --   --  93.2 91.3 92.5  PLT 216 220 192  --   --  186 198 210   Cardiac Enzymes: No results for input(s): CKTOTAL, CKMB, CKMBINDEX, TROPONINI in the last 168 hours. BNP (last 3 results) No results for input(s): BNP in the last 8760 hours.  ProBNP (last 3 results) No results for input(s): PROBNP in the last 8760 hours.  CBG:  Recent Labs Lab 05/06/15 1805 05/06/15 2359 05/07/15 0606 05/07/15 1213 05/07/15 1757  GLUCAP 97 105* 101* 82 106*    Recent Results (from the past 240 hour(s))  Surgical pcr screen     Status: None   Collection Time: 05/04/15  5:14 AM  Result Value Ref Range Status   MRSA, PCR NEGATIVE NEGATIVE Final   Staphylococcus aureus NEGATIVE NEGATIVE Final    Comment:        The Xpert SA Assay (FDA approved for NASAL specimens in patients over 18 years of age), is one component of a comprehensive surveillance program.  Test performance has been validated by [facility] for patients greater than  or equal to 45 year old. It is not intended to diagnose infection nor to guide or monitor treatment.      Studies: No results found.  Scheduled Meds: . acetaminophen  1,000 mg Oral TID  . apixaban  5 mg Oral BID  . diltiazem  300 mg Oral QHS  . dofetilide  250 mcg Oral BID  . estradiol  0.05 mg Transdermal Weekly  . feeding supplement  1 Container Oral BID BM  . fluticasone  2 spray Each Nare Daily  . magnesium oxide  400 mg Oral Daily  . mometasone-formoterol  2 puff Inhalation BID  . montelukast  10 mg Oral Daily  . potassium chloride  40 mEq Oral BID  . saccharomyces boulardii  250 mg Oral BID  . sodium chloride flush  3 mL Intravenous Q12H  . sodium chloride flush  3 mL Intravenous Q12H   Continuous Infusions:    Time spent: 25 minutes    [place]  [facility] Pager [phone]. If 7PM-7AM, please contact night-coverage at [email or web address], password [facility] 05/08/2015, 8:17 PM  LOS: 7 days

## 2015-05-09 LAB — COMPREHENSIVE METABOLIC PANEL
ALT: 17 U/L (ref 14–54)
AST: 14 U/L — ABNORMAL LOW (ref 15–41)
Albumin: 3.2 g/dL — ABNORMAL LOW (ref 3.5–5.0)
Alkaline Phosphatase: 54 U/L (ref 38–126)
Anion gap: 8 (ref 5–15)
BUN: 7 mg/dL (ref 6–20)
CO2: 25 mmol/L (ref 22–32)
Calcium: 8.7 mg/dL — ABNORMAL LOW (ref 8.9–10.3)
Chloride: 104 mmol/L (ref 101–111)
Creatinine, Ser: 0.69 mg/dL (ref 0.44–1.00)
GFR calc Af Amer: >60 mL/min (ref >60)
GFR calc non Af Amer: >60 mL/min (ref >60)
Glucose, Bld: 88 mg/dL (ref 65–99)
Potassium: 4.5 mmol/L (ref 3.5–5.1)
Sodium: 137 mmol/L (ref 135–145)
Total Bilirubin: 0.5 mg/dL (ref 0.3–1.2)
Total Protein: 6 g/dL — ABNORMAL LOW (ref 6.5–8.1)

## 2015-05-09 LAB — CBC WITH DIFFERENTIAL/PLATELET
Basophils Absolute: 0 10*3/uL (ref 0.0–0.1)
Basophils Relative: 0 %
Eosinophils Absolute: 0.3 10*3/uL (ref 0.0–0.7)
Eosinophils Relative: 3 %
HCT: 32.8 % — ABNORMAL LOW (ref 36.0–46.0)
Hemoglobin: 10.4 g/dL — ABNORMAL LOW (ref 12.0–15.0)
Lymphocytes Relative: 21 %
Lymphs Abs: 2.1 10*3/uL (ref 0.7–4.0)
MCH: 29.3 pg (ref 26.0–34.0)
MCHC: 31.7 g/dL (ref 30.0–36.0)
MCV: 92.4 fL (ref 78.0–100.0)
Monocytes Absolute: 0.8 10*3/uL (ref 0.1–1.0)
Monocytes Relative: 8 %
Neutro Abs: 6.9 10*3/uL (ref 1.7–7.7)
Neutrophils Relative %: 68 %
Platelets: 260 10*3/uL (ref 150–400)
RBC: 3.55 MIL/uL — ABNORMAL LOW (ref 3.87–5.11)
RDW: 14 % (ref 11.5–15.5)
WBC: 10.1 10*3/uL (ref 4.0–10.5)

## 2015-05-09 LAB — MAGNESIUM: Magnesium: 1.9 mg/dL (ref 1.7–2.4)

## 2015-05-09 MED ORDER — OXYCODONE HCL 5 MG PO TABS: 5.0000 mg | ORAL_TABLET | Freq: Four times a day (QID) | ORAL | Status: DC | PRN

## 2015-05-09 MED ORDER — MAGNESIUM SULFATE 2 GM/50ML IV SOLN: 2.0000 g | Freq: Once | INTRAVENOUS | Status: DC

## 2015-05-09 NOTE — Progress Notes (Signed)
Nutrition Follow-up  DOCUMENTATION CODES:   Not applicable  INTERVENTION:  - Continue Soft diet - RD will continue to monitor for additional needs before d/c, including further educational needs  NUTRITION DIAGNOSIS:   Inadequate oral intake related to acute illness, nausea, poor appetite as evidenced by per patient/family report. -improving  GOAL:   Patient will meet greater than or equal to 90% of their needs -not fully met at this time, improving  MONITOR:   PO intake, Weight trends, Labs, Skin, I & O's  ASSESSMENT:   70 y.o. female with history of persistent atrial fibrillation, atrial flutter status post ablation, asthma was referred to the ER after patient's CAT scan showed colonic obstruction. Patient has been experiencing abdominal pain off and on for last 1 year. Patient has been taking antibiotics off and on. Last 2 weeks patient's abdominal pain has worsened. Patient did take a course of antibiotics for sinusitis recently. Denies any vomiting but does have some nausea denies any diarrhea. Patient did have a bowel movement yesterday. Patient had recently followed up with her gastroenterologist 4 days ago and had a CAT scan done which shows distal colonic obstruction. Patient was referred to the ER. Patient still has some pain around the left quadrant. Abdomen on exam is nondistended and not tender.  3/7 Per chart review, pt ate 50% of breakfast on 3/5 and no other recent intakes documented. Pt states she is tolerating Soft diet without issue: no N/V, abdominal pain, or diarrhea. Pt states that she is hopeful for d/c today and does have questions concerning soft diet at home. Reviewed diet with pt and her husband, who is at bedside. Provided them with handouts from the [employer] ("Fiber-Restricted Nutrition Therapy," "Low-Fiber Food List," and "High Fiber Food List."). Instructed pt that she will be informed at follow-up outpatient diet when she can begin  adding fiber back into her diet and that high-fiber foods should be avoided until that time.   No further nutrition-related questions or concerns at this time. Not fully meeting needs currently. Medications reviewed. Labs reviewed; Ca: 8.7 mg/dL.    3/3 - Pt had CLD breakfast tray at bedside with only a few sips/bites taken.  - She denies this exacerbating abdominal pain or nausea but that she is feeling very wornout this AM, drowsy, and with headache which she associates with being given Morphine.  - Per order, pt has now been advanced to Dysphagia 1, thin liquids diet (as of 1000). - Per this order, she will then advance to Amo as able.  - Nutrition needs increased, as outlined below, following procedure yesterday.   2/28 - Pt reports she tolerated breakfast on CLD with no N/V or abdominal pain but she has needed to urinate several times and did have diarrhea after intakes.  - Pt had received lunch tray as RD was entering room so discussion was slightly brief so that broth did not cool and popsicle did not melt.  - Notes reviewed from occurences of nausea, vomiting, diarrhea over the past few days. - Pt states that she has had these issues x2 weeks but they were exacerbated by taking Moviprep on Thursday or Friday of last week and that she has not had vomiting since Friday (2/24). - Pt reports since 2/23 she has only been consuming liquids, soup, and had oatmeal once that she tolerated well; no other solid foods since that time.  - Unable to perform physical assessment or talk about weight trends  PTA.  - Per chart review, pt's weight has been stable (142-147) x3 months with no weight hx available previous to that time.     Diet Order:  DIET SOFT Room service appropriate?: Yes; Fluid consistency:: Thin Diet - low sodium heart healthy  Skin:  Wound (see comment) (Perineum and abdominal incisions from partial colectomy 05/04/15)  Last BM:  3/7  Height:   Ht Readings  from Last 1 Encounters:  05/02/15 5' 5"  (1.651 m)    Weight:   Wt Readings from Last 1 Encounters:  05/09/15 139 lb 9.6 oz (63.322 kg)    Ideal Body Weight:  56.82 kg (kg)  BMI:  Body mass index is 23.23 kg/(m^2).  Estimated Nutritional Needs:   Kcal:  1600-1800  Protein:  70-80 grams  Fluid:  2 L/day  EDUCATION NEEDS:   Education needs addressed     Jarome Matin, RD, LDN Inpatient Clinical Dietitian Pager # [phone] After hours/weekend pager # [phone]

## 2015-05-09 NOTE — Progress Notes (Signed)
Patient is stable for discharge. Discharge instructions and medications have been reviewed with the patient and all questions answered. Script given for Oxycodone IR.   Othella Boyer [facility] 05/09/2015 4:00 PM

## 2015-05-09 NOTE — Care Management Note (Signed)
Case Management Note  Patient Details  Name: Elizabeth Mcconnell MRN: [id number] Date of Birth: 07-26-1945  Subjective/Objective:                    Action/Plan:d/c Home.   Expected Discharge Date:   (unknown)               Expected Discharge Plan:  Home/Self Care  In-House Referral:     Discharge planning Services  CM Consult  Post Acute Care Choice:    Choice offered to:     DME Arranged:    DME Agency:     HH Arranged:    [place] Agency:     Status of Service:  Completed, signed off  Medicare Important Message Given:  Yes Date Medicare IM Given:    Medicare IM give by:    Date Additional Medicare IM Given:    Additional Medicare Important Message give by:     If discussed at [place] of Stay Meetings, dates discussed:    Additional Comments:  Dessa Phi, RN 05/09/2015, 3:01 PM

## 2015-05-09 NOTE — Progress Notes (Signed)
5 Days Post-Op  Subjective: She feels fine, sore, but tolerating diet and having BM's.  Incisions all look fine.    Objective: Vital signs in last 24 hours: Temp:  [97.2 F (36.2 C)-98.8 F (37.1 C)] 98 F (36.7 C) (03/07 0636) Pulse Rate:  [73-78] 78 (03/07 0636) Resp:  [18] 18 (03/07 0636) BP: (105-126)/(49-55) 114/49 mmHg (03/07 0636) SpO2:  [96 %-98 %] 97 % (03/07 0636) Weight:  [63.322 kg (139 lb 9.6 oz)] 63.322 kg (139 lb 9.6 oz) (03/07 0636) Last BM Date: 05/08/15 I/O not recorded  Soft diet Afebrile, VSS Labs OK Intake/Output from previous day:   Intake/Output this shift:    General appearance: alert, cooperative and no distress GI: soft, sore, some eccyhymosis, + BS, good bowel function  Lab Results:   Recent Labs  05/08/15 0527 05/09/15 0448  WBC 10.7* 10.1  HGB 10.3* 10.4*  HCT 32.2* 32.8*  PLT 210 260    BMET  Recent Labs  05/08/15 0527 05/09/15 0448  NA 138 137  K 4.1 4.5  CL 107 104  CO2 24 25  GLUCOSE 91 88  BUN 7 7  CREATININE 0.59 0.69  CALCIUM 8.5* 8.7*   PT/INR No results for input(s): LABPROT, INR in the last 72 hours.   Recent Labs Lab 05/04/15 0521 05/06/15 0606 05/08/15 0527 05/09/15 0448  AST 30 21 15  14*  ALT 31 27 19 17   ALKPHOS 54 48 53 54  BILITOT 0.3 0.3 0.4 0.5  PROT 5.8* 5.6* 5.5* 6.0*  ALBUMIN 3.5 3.2* 2.8* 3.2*     Lipase     Component Value Date/Time   LIPASE 34 05/01/2015 1915     Studies/Results: No results found.  Medications: . acetaminophen  1,000 mg Oral TID  . apixaban  5 mg Oral BID  . diltiazem  300 mg Oral QHS  . dofetilide  250 mcg Oral BID  . estradiol  0.05 mg Transdermal Weekly  . feeding supplement  1 Container Oral BID BM  . fluticasone  2 spray Each Nare Daily  . magnesium oxide  400 mg Oral Daily  . mometasone-formoterol  2 puff Inhalation BID  . montelukast  10 mg Oral Daily  . potassium chloride  40 mEq Oral BID  . saccharomyces boulardii  250 mg Oral BID  . sodium  chloride flush  3 mL Intravenous Q12H      Prior to Admission medications   Medication Sig Start Date End Date Taking? Authorizing Provider  acetaminophen (TYLENOL) 650 MG CR tablet Take 1,300 mg by mouth at bedtime. Second dose during day if needed   Yes Historical Provider, MD  diltiazem (CARDIZEM CD) 300 MG 24 hr capsule TAKE 1 CAPSULE BY MOUTH NIGHTLY AT BEDTIME 04/10/15  Yes Sherren Mocha, MD  dofetilide (TIKOSYN) 250 MCG capsule Take 1 capsule (250 mcg total) by mouth 2 (two) times daily. 02/17/15  Yes Amber Sena Slate, NP  ELIQUIS 5 MG TABS tablet TAKE 1 TABLET BY MOUTH 2 TIMES DAILY 11/30/14  Yes Sherren Mocha, MD  estradiol (VIVELLE-DOT) 0.05 MG/24HR patch Place 1 patch onto the skin 2 (two) times a week. 04/24/15  Yes Historical Provider, MD  fluticasone (FLONASE) 50 MCG/ACT nasal spray Place 2 sprays into both nostrils twice daily. 04/24/15  Yes Tammy S Parrett, NP  LINZESS 145 MCG CAPS capsule TAKE 1 CAPSULE BY MOUTH EVERY DAY 02/28/15  Yes Amy S Esterwood, PA-C  magnesium oxide (MAG-OX) 400 MG tablet Take 1 tablet (400 mg total) by  mouth daily. 02/13/15  Yes Sherren Mocha, MD  Melatonin 3 MG TABS Take 1 tablet by mouth daily as needed. For sleep   Yes Historical Provider, MD  mometasone-formoterol (DULERA) 200-5 MCG/ACT AERO Inhale 2 puffs into the lungs 2 (two) times daily. 03/24/15  Yes Javier Glazier, MD  montelukast (SINGULAIR) 10 MG tablet TAKE 1 TABLET BY MOUTH EVERY DAY 03/07/15  Yes Javier Glazier, MD  polyethylene glycol [facility] / GLYCOLAX) packet Take 17 g by mouth daily.   Yes Historical Provider, MD  Sennosides-Docusate Sodium (PERI-COLACE PO) Take 1 tablet by mouth daily.   Yes Historical Provider, MD  estradiol (VIVELLE-DOT) 0.075 MG/24HR PLACE ONE PATCH onto THE SKIN TWICE A WEEK 08/09/14   [name], MD  oxyCODONE (OXY IR/ROXICODONE) 5 MG immediate release tablet Take 1-2 tablets (5-10 mg total) by mouth every 6 (six) hours as needed for moderate pain, severe  pain or breakthrough pain. 05/07/15   Michael Boston, MD  saccharomyces boulardii (FLORASTOR) 250 MG capsule Take 1 capsule (250 mg total) by mouth 2 (two) times daily. 01/24/15   Amy S Esterwood, PA-C  XOPENEX HFA 45 MCG/ACT inhaler USE ONE TO TWO PUFFS EVERY 4 HOURS AS NEEDED Patient taking differently: USE ONE TO TWO PUFFS EVERY 4 HOURS AS NEEDED SOB 03/23/15   Javier Glazier, MD   1. Colon, segmental resection, left / descending - STRICTURED BENIGN COLORECTAL MUCOSA SHOWING ASSOCIATED DIVERTICULITIS WITH EVIDENCE OF PERFORATION. - THREE BENIGN LYMPH NODES (0/3). - NO DYSPLASIA, ATYPIA OR MALIGNANCY IDENTIFIED. - SEE COMMENT. 2. Colon, resection margin (donut), new proximal - BENIGN COLORECTAL MUCOSA. - SCATTERED ACUTE INFLAMMATION ADJACENT TO SEROSAL ASPECT. - NO DYSPLASIA, ATYPIA OR MALIGNANCY IDENTIFIED. 3. Colon, resection margin (donut), anastomosis proximal - BENIGN COLORECTAL MUCOSA - NO EVIDENCE OF SIGNIFICANT INFLAMMATION, DYSPLASIA OR MALIGNANCY. 4. Colon, resection margin (donut), anastomosis distal - BENIGN COLORECTAL MUCOSA WITH MINIMAL SCATTERED ACUTE AND CHRONIC INFLAMMATION. - NO DYSPLASIA, ATYPIA OR MALIGNANCY IDENTIFIED.  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 POD #5 s/p Laparoscopic low anterior resection, splenic flexure mobilization, LOA, rigid proctoscopy; 05/04/15, Dr. Johney Maine  Afib/flutter with RVR Asthma/Tobaccos abuse Prior LLL for spontaneous PTX Hypertension  antibiotics:  None since surgery DVT:  Eliquis/SCD  Plan:  From our standpoint she can go home.  I gave her a copy of her pathology report and told her to follow up with Dr. Johney Maine in 2-3 weeks.  Follow up with our service and discharge instructions are in the AVS.     LOS: 8 days    Conleigh Heinlein 05/09/2015

## 2015-05-09 NOTE — Discharge Summary (Signed)
Elizabeth Mcconnell, is a 70 y.o. female  DOB 1945-03-21  MRN [id number].  Admission date:  05/01/2015  Admitting Physician  Rise Patience, MD  Discharge Date:  05/09/2015   Primary MD  [name], MD  Recommendations for primary care physician for things to follow:  Follow Hb/electrolytes, panculture if fever.  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 Patient had laparoscopic low anterior resection, splenic flexure mobilization, laparoscopic lysis of adhesions, rigid proctoscopy on 05/04/15 by Dr Johney Maine and she has made great recovery since. She was closely followed by cardiology and she is now on her regular medications for atrial fibrillation/atrial flutter including Cardizem/Tikosyn/Eliquis. She is tolerating feeding. Elizabeth Mcconnell is eager to go home and therefore will discharge to follow with cardiology/general surgery/GI/primary care provider in the next week or 2. She has experienced some chills here but no fever and this will need to be followed closely outpatient.  Please refer to the daily progress notes below for details of patient's hospital stay;  Hospital Course  05/03/15: Appreciate cardiology/general surgery. I have seen and examined Elizabeth. Hershkowitz at bedside and reviewed her chart. Elizabeth Mcconnell is a 70 y.o. female with history of persistent atrial fibrillation, atrial flutter status post ablation now on Tikosyn, bronchial asthma who was referred to the ER after patient's CAT scan showed colonic obstruction(?related to end stage diverticulitis) and she is to have laparoscopic colectomy with possible ostomy tomorrow. Her electrolytes are off, including hypokalemia/hypomagnesemia and these were replenished and will need to be closely monitored. Patient is on IV heparin and off Eliquis perioperatively. She denies any complaints. 05/04/15: Postop. Drowsy. Potassium 3.7. Will replenish. Continue Tikosyn, and follow cardiology/general surgery recommendations. 05/05/15: Appreciate general surgery/cardiology. Postop day #1. Patient more with it. Passing gas/bowel movement, ate earlier today, magnesium remains low, she is on heparin drip/Tikosyn. She complains of chills but she has no fever. We will continue to replenish electrolytes, watch for infection, follow general surgery/cardiology recommendations. 05/06/15: Appreciate general surgery/cardiology. Postop day#2 laparoscopic low anterior resection, splenic flexure mobilization, laparoscopic lysis of adhesions, rigid proctoscopy, doing well, tolerating feeding. Patient now on Eliquis. Magnesium low. 05/07/15: Appreciate general surgery/cardiology. Patient has leukocytosis but this may be reactionary. She had diarrhea after eating this afternoon. We'll continue to monitor, home when okay with general surgery. 05/08/15: Appreciate general surgery/cardiology. Patient denies any complaints.  Noted cardiology recommendations to observe patient overnight which will be done. Meanwhile, continue current management. 05/09/15: Feels  better. Has some chills but no fever. Labs unrevealing. Will DC home to follow with cardiology/GI/general surgery/her PCP.  Discharge Condition Stable.  Consults obtained  General surgery Cardiology  Follow UP Follow-up Information    Follow up with Mcconnell,Elizabeth C., MD. Schedule an appointment as soon as possible for a visit in 2 weeks.   Specialty:  General Surgery   Why:  To follow up after your operation, To follow up after your hospital stay, call to verify appointment date/time.   Contact information:   [address] [place] [address] [phone]       Follow up with Elizabeth Grayer, MD On 05/18/2015.   Specialty:  Cardiology   Why:  See MD at 2:15 pm, please arrive 15 minutes early for paperwork.   Contact information:   [place] [address] [phone]         Discharge Instructions  and  Discharge Medications      Discharge Instructions    Call MD for:  extreme fatigue    Complete by:  As directed      Call MD for:  hives    Complete by:  As directed      Call MD for:  persistant nausea and vomiting    Complete by:  As directed      Call MD for:  redness, tenderness, or signs of infection (pain, swelling, redness, odor or green/yellow discharge around incision site)    Complete by:  As directed      Call MD for:  severe uncontrolled pain    Complete by:  As directed      Call MD for:    Complete by:  As directed   Temperature > 101.67F     Diet - low sodium heart healthy    Complete by:  As directed      Diet - low sodium heart healthy    Complete by:  As directed      Discharge instructions    Complete by:  As directed   Please see discharge instruction sheets.  Also refer to handout given an office.  Please call our office if you have any questions or concerns [phone]     Discharge instructions    Complete by:  As directed   Please follow cardiology, general surgery, GI and your primary care physician.       Discharge wound care:    Complete by:  As directed   If you have closed incisions, shower and bathe over these incisions with soap and water every day.  Remove all surgical dressings on postoperative day #3.  You do not need to replace dressings over the closed incisions unless you feel more comfortable with a Band-Aid covering it.   If you have an open wound that requires packing, please see wound care instructions.  In general, remove all dressings, wash wound with soap and water and then replace with saline moistened gauze.  Do the dressing change at least every day.  Please call our office [phone] if you have further questions.     Driving Restrictions    Complete by:  As directed   No driving until off narcotics and can safely swerve away without pain during an emergency     Increase activity slowly    Complete by:  As directed   Walk  an hour a day.  Use 20-30 minute walks.  When you can walk 30 minutes without difficulty, it is fine to restart low impact/moderate activities such as biking, jogging, swimming, sexual activity, etc.  Eventually you can increase to unrestricted activity when not feeling pain.  If you feel pain: STOP![name]   Let pain protect you from overdoing it.  Use ice/heat & over-the-counter pain medications to help minimize soreness.  If that is not enough, then use your narcotic pain prescription as needed to remain active.  It is better to take extra pain medications and be more active than to stay bedridden to avoid all pain medications.     Increase activity slowly    Complete by:  As directed      Lifting restrictions    Complete by:  As directed   Avoid heavy lifting initially.  Do not push through pain.  You have no specific weight limit - if it hurts to do, DON'T DO IT.   If you feel no pain, you are not injuring anything.  Pain will protect you from injury.  Coughing and sneezing are far more stressful to your incision than any lifting.  Avoid resuming heavy  lifting / intense activity until off all narcotic pain medications.  When ready to exercise more, give yourself 2 weeks to gradually get back to full intense exercise/activity.     May shower / Bathe    Complete by:  As directed      May walk up steps    Complete by:  As directed      Sexual Activity Restrictions    Complete by:  As directed   Sexual activity as tolerated.  Do not push through pain.  Pain will protect you from injury.     Walk with assistance    Complete by:  As directed   Walk over an hour a day.  May use a walker/cane/companion to help with balance and stamina.            Medication List    STOP taking these medications        LINZESS 145 MCG Caps capsule  Generic drug:  Linaclotide      TAKE these medications        acetaminophen 650 MG CR tablet  Commonly known as:  TYLENOL  Take 1,300 mg by mouth at bedtime. Second dose during day if needed     diltiazem 300 MG 24 hr capsule  Commonly known as:  CARDIZEM CD  TAKE 1 CAPSULE BY MOUTH NIGHTLY AT BEDTIME     dofetilide 250 MCG capsule  Commonly known as:  TIKOSYN  Take 1 capsule (250 mcg total) by mouth 2 (two) times daily.     ELIQUIS 5 MG Tabs tablet  Generic drug:  apixaban  TAKE 1 TABLET BY MOUTH 2 TIMES DAILY     estradiol 0.075 MG/24HR  Commonly known as:  VIVELLE-DOT  PLACE ONE PATCH onto THE SKIN TWICE A WEEK     estradiol 0.05 MG/24HR patch  Commonly known as:  VIVELLE-DOT  Place 1 patch onto the skin 2 (two) times a week.     fluticasone 50 MCG/ACT nasal spray  Commonly known as:  FLONASE  Place 2 sprays into both nostrils twice daily.     magnesium oxide 400 MG tablet  Commonly known as:  MAG-OX  Take 1 tablet (400 mg total) by mouth daily.     Melatonin 3 MG Tabs  Take 1 tablet  by mouth daily as needed. For sleep     mometasone-formoterol 200-5 MCG/ACT Aero  Commonly known as:  DULERA  Inhale 2 puffs into the lungs 2 (two) times daily.     montelukast 10 MG tablet    Commonly known as:  SINGULAIR  TAKE 1 TABLET BY MOUTH EVERY DAY     oxyCODONE 5 MG immediate release tablet  Commonly known as:  Oxy IR/ROXICODONE  Take 1-2 tablets (5-10 mg total) by mouth every 6 (six) hours as needed for moderate pain, severe pain or breakthrough pain.     PERI-COLACE PO  Take 1 tablet by mouth daily.     polyethylene glycol packet  Commonly known as:  MIRALAX / GLYCOLAX  Take 17 g by mouth daily.     saccharomyces boulardii 250 MG capsule  Commonly known as:  FLORASTOR  Take 1 capsule (250 mg total) by mouth 2 (two) times daily.     XOPENEX HFA 45 MCG/ACT inhaler  Generic drug:  levalbuterol  USE ONE TO TWO PUFFS EVERY 4 HOURS AS NEEDED        Diet and Activity recommendation: See Discharge Instructions above  Major procedures and Radiology Reports - PLEASE review detailed and final reports for all details, in brief -    Ct Abdomen Pelvis W Contrast  05/01/2015  CLINICAL DATA:  Severe diffuse abdominal pain and constipation for 2 weeks. EXAM: CT ABDOMEN AND PELVIS WITH CONTRAST TECHNIQUE: Multidetector CT imaging of the abdomen and pelvis was performed using the standard protocol following bolus administration of intravenous contrast. CONTRAST:  129mL OMNIPAQUE IOHEXOL 300 MG/ML  SOLN COMPARISON:  12/10/2013 FINDINGS: Lower chest:  No acute findings. Hepatobiliary: No masses or other significant abnormality. Gallbladder is unremarkable. Pancreas: No mass, inflammatory changes, or other significant abnormality. Spleen: Within normal limits in size and appearance. Adrenals/Urinary Tract: No masses identified. No evidence of hydronephrosis. Duplicated left renal collecting system incidentally noted. Stomach/Bowel: No evidence of small bowel dilatation. Diffuse colonic dilatation is seen with multiple air-fluid levels. Diverticulosis is seen involving the sigmoid colon, without definite acute pericolonic inflammatory changes. There is a relatively short segment of  wall thickening seen involving the mid sigmoid colon on image 60/series 2. Although this could be due to mild diverticulitis or muscular hypertrophy, obstructing colon carcinoma cannot definitely be excluded. Vascular/Lymphatic: No pathologically enlarged lymph nodes. No evidence of abdominal aortic aneurysm. Reproductive: Prior hysterectomy noted. Adnexal regions are unremarkable in appearance. A Other: None. Musculoskeletal:  No suspicious bone lesions identified. IMPRESSION: Findings consistent with distal colonic obstruction in the mid sigmoid colon. There is evidence of diverticulosis in this region CT on but there is also a short segment area of concentric wall thickening without acute pericolonic inflammatory changes. Differential diagnosis includes mild diverticulitis, muscular hypertrophy, and an obstructing colon carcinoma cannot definitely be excluded. Recommend clinical correlation, and consider sigmoidoscopy for further evaluation. No evidence of abscess or metastatic disease within the abdomen or pelvis. Electronically Signed   By: Earle Gell M.D.   On: 05/01/2015 16:40    Micro Results   Recent Results (from the past 240 hour(s))  Surgical pcr screen     Status: None   Collection Time: 05/04/15  5:14 AM  Result Value Ref Range Status   MRSA, PCR NEGATIVE NEGATIVE Final   Staphylococcus aureus NEGATIVE NEGATIVE Final    Comment:        The Xpert SA Assay (FDA approved for NASAL specimens in patients over 87 years of age), is  one component of a comprehensive surveillance program.  Test performance has been validated by [facility] for patients greater than or equal to 35 year old. It is not intended to diagnose infection nor to guide or monitor treatment.        Today   Subjective:   Delinah Humbert denies any complaints..   Objective:   Blood pressure 125/55, pulse 77, temperature 99.1 F (37.3 C), temperature source Rectal, resp. rate 20, height 5\' 5"  (1.651 m),  weight 63.322 kg (139 lb 9.6 oz), SpO2 99 %.  No intake or output data in the 24 hours ending 05/09/15 1509  Exam   Data Review   CBC w Diff:  Lab Results  Component Value Date   WBC 10.1 05/09/2015   HGB 10.4* 05/09/2015   HCT 32.8* 05/09/2015   PLT 260 05/09/2015   LYMPHOPCT 21 05/09/2015   MONOPCT 8 05/09/2015   EOSPCT 3 05/09/2015   BASOPCT 0 05/09/2015    CMP:  Lab Results  Component Value Date   NA 137 05/09/2015   K 4.5 05/09/2015   CL 104 05/09/2015   CO2 25 05/09/2015   BUN 7 05/09/2015   CREATININE 0.69 05/09/2015   CREATININE 0.80 02/22/2015   PROT 6.0* 05/09/2015   ALBUMIN 3.2* 05/09/2015   BILITOT 0.5 05/09/2015   ALKPHOS 54 05/09/2015   AST 14* 05/09/2015   ALT 17 05/09/2015  .   Total Time in preparing paper work, data evaluation and todays exam - 35 minutes  Kaitlin Alcindor M.D on 05/09/2015 at 3:09 PM  [facility] Office  [phone]

## 2015-05-10 ENCOUNTER — Telehealth: Payer: Self-pay

## 2015-05-10 NOTE — Telephone Encounter (Signed)
Pt on TCM list. D/c'ed from hospital on 05/09/15 for colonic obstruction.   D/c'ed to home and follow up with General surgery and Cardiology.   Did call pt to offer annual visit appt that is coming due.

## 2015-05-18 ENCOUNTER — Ambulatory Visit (INDEPENDENT_AMBULATORY_CARE_PROVIDER_SITE_OTHER): Payer: Medicare Other | Admitting: Internal Medicine

## 2015-05-18 ENCOUNTER — Ambulatory Visit
Admission: RE | Admit: 2015-05-18 | Discharge: 2015-05-18 | Disposition: A | Discharge status: Home / Self Care | Payer: Medicare Other | Source: Ambulatory Visit | Attending: Internal Medicine | Admitting: Internal Medicine

## 2015-05-18 ENCOUNTER — Encounter: Payer: Self-pay | Admitting: Internal Medicine

## 2015-05-18 VITALS — BP 102/62 | HR 68 | Ht 65.0 in | Wt 137.6 lb

## 2015-05-18 DIAGNOSIS — I480 Paroxysmal atrial fibrillation: Secondary | ICD-10-CM

## 2015-05-18 DIAGNOSIS — R0602 Shortness of breath: Secondary | ICD-10-CM

## 2015-05-18 DIAGNOSIS — R059 Cough, unspecified: Secondary | ICD-10-CM

## 2015-05-18 DIAGNOSIS — R05 Cough: Secondary | ICD-10-CM | POA: Diagnosis not present

## 2015-05-18 LAB — BASIC METABOLIC PANEL
BUN: 14 mg/dL (ref 7–25)
CO2: 25 mmol/L (ref 20–31)
Calcium: 9.7 mg/dL (ref 8.6–10.4)
Chloride: 103 mmol/L (ref 98–110)
Creat: 0.81 mg/dL (ref 0.60–0.93)
Glucose, Bld: 88 mg/dL (ref 65–99)
Potassium: 4.3 mmol/L (ref 3.5–5.3)
Sodium: 139 mmol/L (ref 135–146)

## 2015-05-18 LAB — CBC WITH DIFFERENTIAL/PLATELET
Basophils Absolute: 0 10*3/uL (ref 0.0–0.1)
Basophils Relative: 0 % (ref 0–1)
Eosinophils Absolute: 0.3 10*3/uL (ref 0.0–0.7)
Eosinophils Relative: 3 % (ref 0–5)
HCT: 34.6 % — ABNORMAL LOW (ref 36.0–46.0)
Hemoglobin: 11.6 g/dL — ABNORMAL LOW (ref 12.0–15.0)
Lymphocytes Relative: 21 % (ref 12–46)
Lymphs Abs: 2.2 10*3/uL (ref 0.7–4.0)
MCH: 29.5 pg (ref 26.0–34.0)
MCHC: 33.5 g/dL (ref 30.0–36.0)
MCV: 88 fL (ref 78.0–100.0)
MPV: 10 fL (ref 8.6–12.4)
Monocytes Absolute: 0.5 10*3/uL (ref 0.1–1.0)
Monocytes Relative: 5 % (ref 3–12)
Neutro Abs: 7.5 10*3/uL (ref 1.7–7.7)
Neutrophils Relative %: 71 % (ref 43–77)
Platelets: 420 10*3/uL — ABNORMAL HIGH (ref 150–400)
RBC: 3.93 MIL/uL (ref 3.87–5.11)
RDW: 13.4 % (ref 11.5–15.5)
WBC: 10.5 10*3/uL (ref 4.0–10.5)

## 2015-05-18 LAB — MAGNESIUM: Magnesium: 2 mg/dL (ref 1.5–2.5)

## 2015-05-18 NOTE — Progress Notes (Signed)
PCP: Elizabeth Calico, MD Primary Cardiologist:  Elizabeth Mcconnell Elizabeth is a 70 y.o. female who presents today for routine electrophysiology followup. I have not seen her in the office in several years.  She is s/p atrial flutter ablation by me in 2014.  She subsequently developed atrial fibrillation and was admitted to my service in 12/16 for initiation of tikosyn.  She reports doing very well with tikosyn with significant reduction in burden of afib thereafter.  She more recently had SBO and was admitted 3/17 requiring abdominal surgery for LOA.  She did have perioperative atrial fibrillation with nocturnal pauses noted.  She has since restarted tikosyn.  She feels that she is improving and her afib is returning to baseline.  She has had + cough and wheeze for several days. Denies fevers. Today, she denies symptoms of chest pain, shortness of breath,  lower extremity edema, dizziness, presyncope, or syncope.  The patient is otherwise without complaint today.   Past Medical History  Diagnosis Date  . Tobacco abuse   . Allergic rhinitis   . Oral candidiasis   . Atrial flutter ([place])     failed cardioversion 11/2012; s/p ablation 02-02-2013 by Elizabeth Mcconnell  . Asthma   . Pneumothorax on left 1996    left lower lung collapse s/p resection  . Osteopenia 08/2013     T score -2.3 FRAX 17%/3.0% stable from prior DEXA  . ALLERGIC RHINITIS   . Endometriosis   . Adenomatous polyp of colon 10/2013    f/u colo 10/2018  . Atrial fibrillation with RVR ([place])   . Essential hypertension 05/30/2014  . Diverticulosis   . Chronic bronchitis ([place])     "I'll have it most years" (02/14/2015)  . Arthritis     "neck, back, hands" (02/14/2015)  . ANXIETY     "situational" (02/14/2015)   Past Surgical History  Procedure Laterality Date  . Lung removal, partial Left 02/1965    LLL  . Cesarean section  1976; 1978  . Total abdominal hysterectomy w/ bilateral salpingoophorectomy  1984    Endometriosis  .  Cardioversion N/A 11/27/2012    Procedure: CARDIOVERSION;  Surgeon: Elizabeth Headings, MD;  Location: [facility] ENDOSCOPY;  Service: Cardiovascular;  Laterality: N/A;  . Atrial flutter ablation N/A 02/02/2013    Procedure: ATRIAL FLUTTER ABLATION;  Surgeon: Elizabeth Mark, MD;  Location: [facility] CATH LAB;  Service: Cardiovascular;  Laterality: N/A;  . Appendectomy  1978  . Laparoscopic partial colectomy N/A 05/04/2015    Procedure: LAPAROSCOPIC LOW ANTERIOR RESECTION LAPAROSCOPIC LYSIS OF ADHESIONS, SPLENIC FLEXURE MOBILIZATION;  Surgeon: Elizabeth Boston, MD;  Location: [facility] ORS;  Service: General;  Laterality: N/A;    Current Outpatient Prescriptions  Medication Sig Dispense Refill  . acetaminophen (TYLENOL) 650 MG CR tablet Take 1,300 mg by mouth at bedtime. Second dose during day if needed    . diltiazem (CARDIZEM CD) 300 MG 24 hr capsule TAKE 1 CAPSULE BY MOUTH NIGHTLY AT BEDTIME 30 capsule 11  . dofetilide (TIKOSYN) 250 MCG capsule Take 1 capsule (250 mcg total) by mouth 2 (two) times daily. 14 capsule 0  . ELIQUIS 5 MG TABS tablet TAKE 1 TABLET BY MOUTH 2 TIMES DAILY 60 tablet 5  . estradiol (VIVELLE-DOT) 0.05 MG/24HR patch Place 1 patch onto the skin 2 (two) times a week.  12  . estradiol (VIVELLE-DOT) 0.075 MG/24HR PLACE ONE PATCH onto THE SKIN TWICE A WEEK 4 patch 0  . fluticasone (FLONASE) 50 MCG/ACT nasal spray Place  2 sprays into both nostrils twice daily. 16 g 2  . magnesium oxide (MAG-OX) 400 MG tablet Take 1 tablet (400 mg total) by mouth daily. 30 tablet 6  . Melatonin 3 MG TABS Take 1 tablet by mouth daily as needed. For sleep    . mometasone-formoterol (DULERA) 200-5 MCG/ACT AERO Inhale 2 puffs into the lungs 2 (two) times daily. 1 Inhaler 0  . montelukast (SINGULAIR) 10 MG tablet TAKE 1 TABLET BY MOUTH EVERY DAY 30 tablet 2  . oxyCODONE (OXY IR/ROXICODONE) 5 MG immediate release tablet Take 1-2 tablets (5-10 mg total) by mouth every 6 (six) hours as needed for moderate pain, severe pain or  breakthrough pain. 40 tablet 0  . saccharomyces boulardii (FLORASTOR) 250 MG capsule Take 1 capsule (250 mg total) by mouth 2 (two) times daily. 60 capsule 1  . XOPENEX HFA 45 MCG/ACT inhaler USE ONE TO TWO PUFFS EVERY 4 HOURS AS NEEDED (Patient taking differently: USE ONE TO TWO PUFFS EVERY 4 HOURS AS NEEDED SOB) 15 g 3   No current facility-administered medications for this visit.    Physical Exam: Filed Vitals:   05/18/15 1430  BP: 102/62  Pulse: 68  Height: 5\' 5"  (1.651 m)  Weight: 137 lb 9.6 oz (62.415 kg)    GEN- The patient is well appearing, alert and oriented x 3 today.   Head- normocephalic, atraumatic Eyes-  Sclera clear, conjunctiva pink Ears- hearing intact Oropharynx- clear Lungs- diffuse wheezing with rhonchi at L base, normal work of breathing Heart- Regular rate and rhythm, no murmurs, rubs or gallops, PMI not laterally displaced GI- soft, NT, ND, + BS Extremities- no clubbing, cyanosis, or edema  ekg today reveals sinus rhythm with incomplete RBBB, Qtc 459  Assessment and Plan:  1. Atrial fibrillation Continue tikosyn Check bmet, mg today If AF burden increases after she is recovered from surgery, could consider ablation On eliquis  2. Cough/ rhonchi/ wheeze I am concerned that she may have postoperative pneumonia vs atalectasis by exam.  She has had prior LLL resection and this may explain exam findings also.  No indication for antibiotics currently.   Will obtain CXR Follow-up with Elizabeth Elizabeth Mcconnell Would not be a candidate for Levaquin as she is on tikosyn.    Follow-up with Elizabeth Elizabeth Mcconnell and Elizabeth Mcconnell in the AF clinic and I will see when needed  Thompson Grayer MD, [facility] 05/18/2015 3:00 PM

## 2015-05-18 NOTE — Patient Instructions (Signed)
Medication Instructions:  Your physician recommends that you continue on your current medications as directed. Please refer to the Current Medication list given to you today.   Labwork: Your physician recommends that you return for lab work today: BMP/Mag/CBC   Testing/Procedures: A chest x-ray takes a picture of the organs and structures inside the chest, including the heart, lungs, and blood vessels. This test can show several things, including, whether the heart is enlarges; whether fluid is building up in the lungs; and whether pacemaker / defibrillator leads are still in place.   Follow-Up: Your physician recommends that you schedule a follow-up appointment in: 3 months with Roderic Palau, NP   Any Other Special Instructions Will Be Listed Below (If Applicable).     If you need a refill on your cardiac medications before your next appointment, please call your pharmacy.

## 2015-05-30 ENCOUNTER — Encounter: Payer: Self-pay | Admitting: Internal Medicine

## 2015-05-30 ENCOUNTER — Ambulatory Visit (INDEPENDENT_AMBULATORY_CARE_PROVIDER_SITE_OTHER): Payer: Medicare Other | Admitting: Internal Medicine

## 2015-05-30 ENCOUNTER — Other Ambulatory Visit (INDEPENDENT_AMBULATORY_CARE_PROVIDER_SITE_OTHER): Payer: Medicare Other

## 2015-05-30 VITALS — BP 130/86 | HR 100 | Temp 97.4°F | Resp 16 | Ht 65.0 in | Wt 139.0 lb

## 2015-05-30 DIAGNOSIS — J4540 Moderate persistent asthma, uncomplicated: Secondary | ICD-10-CM

## 2015-05-30 DIAGNOSIS — E538 Deficiency of other specified B group vitamins: Secondary | ICD-10-CM

## 2015-05-30 DIAGNOSIS — I10 Essential (primary) hypertension: Secondary | ICD-10-CM

## 2015-05-30 DIAGNOSIS — D539 Nutritional anemia, unspecified: Secondary | ICD-10-CM | POA: Diagnosis not present

## 2015-05-30 DIAGNOSIS — I4891 Unspecified atrial fibrillation: Secondary | ICD-10-CM

## 2015-05-30 DIAGNOSIS — E785 Hyperlipidemia, unspecified: Secondary | ICD-10-CM

## 2015-05-30 DIAGNOSIS — H8111 Benign paroxysmal vertigo, right ear: Secondary | ICD-10-CM | POA: Diagnosis not present

## 2015-05-30 LAB — CBC WITH DIFFERENTIAL/PLATELET
Basophils Absolute: 0.1 10*3/uL (ref 0.0–0.1)
Basophils Relative: 0.6 % (ref 0.0–3.0)
Eosinophils Absolute: 0.7 10*3/uL (ref 0.0–0.7)
Eosinophils Relative: 7.4 % — ABNORMAL HIGH (ref 0.0–5.0)
HCT: 38.9 % (ref 36.0–46.0)
Hemoglobin: 12.7 g/dL (ref 12.0–15.0)
Lymphocytes Relative: 17 % (ref 12.0–46.0)
Lymphs Abs: 1.7 10*3/uL (ref 0.7–4.0)
MCHC: 32.7 g/dL (ref 30.0–36.0)
MCV: 86.8 fl (ref 78.0–100.0)
Monocytes Absolute: 0.7 10*3/uL (ref 0.1–1.0)
Monocytes Relative: 7 % (ref 3.0–12.0)
Neutro Abs: 6.7 10*3/uL (ref 1.4–7.7)
Neutrophils Relative %: 68 % (ref 43.0–77.0)
Platelets: 224 10*3/uL (ref 150.0–400.0)
RBC: 4.49 Mil/uL (ref 3.87–5.11)
RDW: 14.2 % (ref 11.5–15.5)
WBC: 9.9 10*3/uL (ref 4.0–10.5)

## 2015-05-30 LAB — TSH: TSH: 1.48 u[IU]/mL (ref 0.35–4.50)

## 2015-05-30 LAB — LIPID PANEL
Cholesterol: 261 mg/dL — ABNORMAL HIGH (ref 0–200)
HDL: 65.7 mg/dL (ref >39.00)
LDL Cholesterol: 157 mg/dL — ABNORMAL HIGH (ref 0–99)
NonHDL: 195.2
Total CHOL/HDL Ratio: 4
Triglycerides: 192 mg/dL — ABNORMAL HIGH (ref 0.0–149.0)
VLDL: 38.4 mg/dL (ref 0.0–40.0)

## 2015-05-30 LAB — IBC PANEL
Iron: 61 ug/dL (ref 42–145)
Saturation Ratios: 14.9 % — ABNORMAL LOW (ref 20.0–50.0)
Transferrin: 293 mg/dL (ref 212.0–360.0)

## 2015-05-30 LAB — BASIC METABOLIC PANEL
BUN: 12 mg/dL (ref 6–23)
CO2: 27 mEq/L (ref 19–32)
Calcium: 9.8 mg/dL (ref 8.4–10.5)
Chloride: 105 mEq/L (ref 96–112)
Creatinine, Ser: 0.84 mg/dL (ref 0.40–1.20)
GFR: 70.9 mL/min (ref >60.00)
Glucose, Bld: 80 mg/dL (ref 70–99)
Potassium: 4.5 mEq/L (ref 3.5–5.1)
Sodium: 140 mEq/L (ref 135–145)

## 2015-05-30 LAB — FOLATE: Folate: 9.2 ng/mL (ref >5.9)

## 2015-05-30 LAB — VITAMIN B12: Vitamin B-12: 229 pg/mL (ref 211–911)

## 2015-05-30 LAB — FERRITIN: Ferritin: 24.6 ng/mL (ref 10.0–291.0)

## 2015-05-30 LAB — MAGNESIUM: Magnesium: 2.4 mg/dL (ref 1.5–2.5)

## 2015-05-30 MED ORDER — DIAZEPAM 2 MG PO TABS: 2.0000 mg | ORAL_TABLET | Freq: Two times a day (BID) | ORAL | Status: DC | PRN

## 2015-05-30 MED ORDER — FLUTICASONE PROPIONATE HFA 110 MCG/ACT IN AERO: 1.0000 | INHALATION_SPRAY | Freq: Two times a day (BID) | RESPIRATORY_TRACT | Status: DC

## 2015-05-30 MED ORDER — CYANOCOBALAMIN 500 MCG/0.1ML NA SOLN: 0.1000 mL | NASAL | Status: DC

## 2015-05-30 NOTE — Progress Notes (Addendum)
Subjective:  Patient ID: Elizabeth Mcconnell, female    DOB: 1945-12-23  Age: 70 y.o. MRN: [id number]  CC: Atrial Fibrillation; Anemia; and Asthma   HPI Elizabeth Mcconnell presents for follow-up. She was recently admitted for colonic obstruction and is status post surgical repair. Her abdomen is feeling much better. Today she complains of dizziness and vertigo when she lays down and turns her head to the right. [name] had these similar symptoms before and was treated for vertigo. She also complains of fatigue but she denies headache, visual changes, hearing loss or tinnitus, chest pain, shortness of breath, numbness, weakness, tingling, or syncope.  She is also being treated for asthma but tells me that Symbicort has caused her to feel jittery and shaky and may be elevating her heart rate. Her asthma has been very well controlled recently. She denies any recent episodes of coughing or shortness of breath and she rarely wheezes.  Outpatient Prescriptions Prior to Visit  Medication Sig Dispense Refill  . acetaminophen (TYLENOL) 650 MG CR tablet Take 1,300 mg by mouth at bedtime. Second dose during day if needed    . diltiazem (CARDIZEM CD) 300 MG 24 hr capsule TAKE 1 CAPSULE BY MOUTH NIGHTLY AT BEDTIME 30 capsule 11  . dofetilide (TIKOSYN) 250 MCG capsule Take 1 capsule (250 mcg total) by mouth 2 (two) times daily. 14 capsule 0  . ELIQUIS 5 MG TABS tablet TAKE 1 TABLET BY MOUTH 2 TIMES DAILY 60 tablet 5  . estradiol (VIVELLE-DOT) 0.05 MG/24HR patch Place 1 patch onto the skin 2 (two) times a week.  12  . fluticasone (FLONASE) 50 MCG/ACT nasal spray Place 2 sprays into both nostrils twice daily. 16 g 2  . magnesium oxide (MAG-OX) 400 MG tablet Take 1 tablet (400 mg total) by mouth daily. 30 tablet 6  . Melatonin 3 MG TABS Take 1 tablet by mouth daily as needed. For sleep    . montelukast (SINGULAIR) 10 MG tablet TAKE 1 TABLET BY MOUTH EVERY DAY 30 tablet 2  . saccharomyces boulardii  (FLORASTOR) 250 MG capsule Take 1 capsule (250 mg total) by mouth 2 (two) times daily. 60 capsule 1  . XOPENEX HFA 45 MCG/ACT inhaler USE ONE TO TWO PUFFS EVERY 4 HOURS AS NEEDED (Patient taking differently: USE ONE TO TWO PUFFS EVERY 4 HOURS AS NEEDED SOB) 15 g 3  . estradiol (VIVELLE-DOT) 0.075 MG/24HR PLACE ONE PATCH onto THE SKIN TWICE A WEEK 4 patch 0  . oxyCODONE (OXY IR/ROXICODONE) 5 MG immediate release tablet Take 1-2 tablets (5-10 mg total) by mouth every 6 (six) hours as needed for moderate pain, severe pain or breakthrough pain. 40 tablet 0  . mometasone-formoterol (DULERA) 200-5 MCG/ACT AERO Inhale 2 puffs into the lungs 2 (two) times daily. (Patient not taking: Reported on 05/30/2015) 1 Inhaler 0   No facility-administered medications prior to visit.    ROS Review of Systems  Constitutional: Positive for fatigue. Negative for fever, chills, diaphoresis, activity change, appetite change and unexpected weight change.  HENT: Negative.  Negative for congestion, ear pain, facial swelling, rhinorrhea, sinus pressure, sneezing, sore throat, tinnitus and trouble swallowing.   Eyes: Negative.  Negative for photophobia and visual disturbance.  Respiratory: Negative.  Negative for cough, choking, chest tightness, shortness of breath and stridor.   Cardiovascular: Negative.  Negative for chest pain, palpitations and leg swelling.  Gastrointestinal: Negative.  Negative for nausea, vomiting, abdominal pain, diarrhea and constipation.  Endocrine: Negative.   Genitourinary: Negative.  Musculoskeletal: Negative.  Negative for myalgias, back pain, arthralgias and neck pain.  Skin: Negative.  Negative for color change, pallor and rash.  Allergic/Immunologic: Negative.   Neurological: Positive for dizziness. Negative for tremors, syncope, facial asymmetry, speech difficulty, weakness, light-headedness, numbness and headaches.  Hematological: Negative.  Negative for adenopathy. Does not bruise/bleed  easily.  Psychiatric/Behavioral: Negative.     Objective:  BP 130/86 mmHg  Pulse 100  Temp(Src) 97.4 F (36.3 C) (Oral)  Resp 16  Ht 5\' 5"  (1.651 m)  Wt 139 lb (63.05 kg)  BMI 23.13 kg/m2  SpO2 97%  BP Readings from Last 3 Encounters:  05/30/15 130/86  05/18/15 102/62  05/09/15 125/55    Wt Readings from Last 3 Encounters:  05/30/15 139 lb (63.05 kg)  05/18/15 137 lb 9.6 oz (62.415 kg)  05/09/15 139 lb 9.6 oz (63.322 kg)    Physical Exam  Constitutional: She is oriented to person, place, and time. She appears well-developed and well-nourished.  Non-toxic appearance. She does not have a sickly appearance. She does not appear ill. No distress.  HENT:  Mouth/Throat: Oropharynx is clear and moist. No oropharyngeal exudate.  Eyes: Conjunctivae are normal. Right eye exhibits no discharge. Left eye exhibits no discharge. No scleral icterus.  Neck: Normal range of motion. Neck supple. No JVD present. No tracheal deviation present. No thyromegaly present.  Cardiovascular: Normal heart sounds and intact distal pulses.  An irregularly irregular rhythm present. Tachycardia present.  Exam reveals no gallop and no friction rub.   No murmur heard. Pulmonary/Chest: Effort normal and breath sounds normal. No stridor. No respiratory distress. She has no wheezes. She has no rales. She exhibits no tenderness.  Abdominal: Soft. Bowel sounds are normal. She exhibits no distension and no mass. There is no tenderness. There is no rebound and no guarding.  Musculoskeletal: Normal range of motion. She exhibits no edema or tenderness.  Lymphadenopathy:    She has no cervical adenopathy.  Neurological: She is oriented to person, place, and time. She has normal strength. She displays no atrophy, no tremor and normal reflexes. No cranial nerve deficit or sensory deficit. She exhibits normal muscle tone. She displays a negative Romberg sign. She displays no seizure activity. Coordination and gait normal.  She displays no Babinski's sign on the right side. She displays no Babinski's sign on the left side.  Skin: Skin is warm and dry. No rash noted. She is not diaphoretic. No erythema. No pallor.  Psychiatric: She has a normal mood and affect. Her behavior is normal. Judgment and thought content normal.  Vitals reviewed.   Lab Results  Component Value Date   WBC 9.9 05/30/2015   HGB 12.7 05/30/2015   HCT 38.9 05/30/2015   PLT 224.0 05/30/2015   GLUCOSE 80 05/30/2015   CHOL 261* 05/30/2015   TRIG 192.0* 05/30/2015   HDL 65.70 05/30/2015   LDLCALC 157* 05/30/2015   ALT 17 05/09/2015   AST 14* 05/09/2015   NA 140 05/30/2015   K 4.5 05/30/2015   CL 105 05/30/2015   CREATININE 0.84 05/30/2015   BUN 12 05/30/2015   CO2 27 05/30/2015   TSH 1.48 05/30/2015   INR 1.30 05/01/2015   HGBA1C 5.5 05/03/2015    Dg Chest 2 View  05/18/2015  CLINICAL DATA:  Cough and shortness of breath. EXAM: CHEST  2 VIEW COMPARISON:  10/20/2014 chest radiograph. FINDINGS: Stable cardiomediastinal silhouette with normal heart size. No pneumothorax. No pleural effusion. There is stable left upper lobe scarring with  left superior hilar retraction and biapical pleural-parenchymal scarring asymmetric to the left, not appreciably changed back to 10/18/2004. No pulmonary edema. No acute consolidative airspace disease. Stable mild scarring at the left lung base. IMPRESSION: 1. No acute consolidative airspace disease to suggest a pneumonia. 2. Stable chronic left upper lobe scarring with left superior hilar retraction, probably the sequela of remote granulomatous disease. Electronically Signed   By: Ilona Sorrel M.D.   On: 05/18/2015 18:40    Assessment & Plan:   Elizabeth Mcconnell was seen today for atrial fibrillation, anemia and asthma.  Diagnoses and all orders for this visit:  Asthma, moderate persistent, uncomplicated- she is not tolerating the LABA therapy, will change to inhaled corticosteroid -     fluticasone (FLOVENT  HFA) 110 MCG/ACT inhaler; Inhale 1 puff into the lungs 2 (two) times daily.  BPPV (benign paroxysmal positional vertigo), right- I think she would benefit from taking Valium for severe symptoms and of also asked her to follow-up with vestibular physical therapy, today she was given information on how to perform the Dix-Hallpike maneuver to relieve her symptoms. -     PT Vestibular Evaluation; Future -     diazepam (VALIUM) 2 MG tablet; Take 1 tablet (2 mg total) by mouth every 12 (twelve) hours as needed for anxiety.  Hyperlipidemia with target LDL less than 160- her Framingham risk score is only 8% so at this time I do not recommend that she start a statin -     Lipid panel; Future -     TSH; Future  Atrial fibrillation with RVR ([employer])- she has relatively good rate control, I am concerned that the LABA therapy may be elevating her heart rate so will discontinue  Essential hypertension- her blood pressure is well-controlled, lites and renal function are stable. -     Basic metabolic panel; Future -     Magnesium; Future  Deficiency anemia- the anemia has resolved, her B12 level is getting low so will start treating that with an oral B12 supplement -     CBC with Differential/Platelet; Future -     Ferritin; Future -     Folate; Future -     IBC panel; Future -     Vitamin B12; Future  B12 nutritional deficiency- she tells me that the B12 nasal spray is too expensive so we'll change to an oral B12 supplement -     Cyanocobalamin 500 MCG/0.1ML SOLN; Place 0.1 mLs (500 mcg total) into the nose once a week.  I have discontinued Ms. Zilka's mometasone-formoterol and oxyCODONE. I am also having her start on fluticasone, diazepam, and Cyanocobalamin. Additionally, I am having her maintain her Melatonin, acetaminophen, ELIQUIS, saccharomyces boulardii, magnesium oxide, dofetilide, montelukast, XOPENEX HFA, diltiazem, fluticasone, and estradiol.  Meds ordered this encounter  Medications  .  fluticasone (FLOVENT HFA) 110 MCG/ACT inhaler    Sig: Inhale 1 puff into the lungs 2 (two) times daily.    Dispense:  1 Inhaler    Refill:  12  . diazepam (VALIUM) 2 MG tablet    Sig: Take 1 tablet (2 mg total) by mouth every 12 (twelve) hours as needed for anxiety.    Dispense:  60 tablet    Refill:  1  . Cyanocobalamin 500 MCG/0.1ML SOLN    Sig: Place 0.1 mLs (500 mcg total) into the nose once a week.    Dispense:  2.3 mL    Refill:  11     Follow-up: Return in about 3 weeks (  around 06/20/2015).  [name], MD

## 2015-05-30 NOTE — Progress Notes (Signed)
Pre visit review using our clinic review tool, if applicable. No additional management support is needed unless otherwise documented below in the visit note. 

## 2015-05-30 NOTE — Patient Instructions (Signed)
Vertigo Vertigo means that you feel like you are moving when you are not. Vertigo can also make you feel like things around you are moving when they are not. This feeling can come and go at any time. Vertigo often goes away on its own. HOME CARE  Avoid making fast movements.  Avoid driving.  Avoid using heavy machinery.  Avoid doing any task or activity that might cause danger to you or other people if you would have a vertigo attack while you are doing it.  Sit down right away if you feel dizzy or have trouble with your balance.  Take over-the-counter and prescription medicines only as told by your doctor.  Follow instructions from your doctor about which positions or movements you should avoid.  Drink enough fluid to keep your pee (urine) clear or pale yellow.  Keep all follow-up visits as told by your doctor. This is important. GET HELP IF:  Medicine does not help your vertigo.  You have a fever.  Your problems get worse or you have new symptoms.  Your family or friends see changes in your behavior.  You feel sick to your stomach (nauseous) or you throw up (vomit).  You have a "pins and needles" feeling or you are numb in part of your body. GET HELP RIGHT AWAY IF:  You have trouble moving or talking.  You are always dizzy.  You pass out (faint).  You get very bad headaches.  You feel weak or have trouble using your hands, arms, or legs.  You have changes in your hearing.  You have changes in your seeing (vision).  You get a stiff neck.  Bright light starts to bother you.   This information is not intended to replace advice given to you by your health care provider. Make sure you discuss any questions you have with your health care provider.   Document Released: 11/28/2007 Document Revised: 11/09/2014 Document Reviewed: 06/13/2014 Elsevier Interactive Patient Education 2016 [employer] Inc[employer]  

## 2015-05-31 ENCOUNTER — Telehealth: Payer: Self-pay

## 2015-05-31 NOTE — Telephone Encounter (Signed)
PA initiated via CoverMyMeds key [id number]

## 2015-06-05 ENCOUNTER — Other Ambulatory Visit: Payer: Self-pay | Admitting: Internal Medicine

## 2015-06-05 MED ORDER — CYANOCOBALAMIN 2000 MCG PO TABS: 2000.0000 ug | ORAL_TABLET | Freq: Every day | ORAL | Status: DC

## 2015-06-05 NOTE — Addendum Note (Signed)
Addended by: Janith Lima on: 06/05/2015 11:36 AM   Modules accepted: Orders, Medications

## 2015-06-06 NOTE — Telephone Encounter (Signed)
PA B12 nasal spray denied and changed to tablet (did not require a PA).

## 2015-06-12 LAB — HM MAMMOGRAPHY

## 2015-06-14 ENCOUNTER — Other Ambulatory Visit: Payer: Self-pay | Admitting: Cardiovascular Disease

## 2015-06-14 ENCOUNTER — Other Ambulatory Visit: Payer: Self-pay | Admitting: Pulmonary Disease

## 2015-06-14 ENCOUNTER — Encounter: Payer: Self-pay | Admitting: Internal Medicine

## 2015-06-21 ENCOUNTER — Telehealth: Payer: Self-pay | Admitting: Pulmonary Disease

## 2015-06-21 NOTE — Telephone Encounter (Signed)
Left message for patient to call back  

## 2015-06-21 NOTE — Telephone Encounter (Signed)
lmtcb x1 for pt. 

## 2015-06-21 NOTE — Telephone Encounter (Signed)
Patient called returning our call. Patient can be reached at [phone]. -PM

## 2015-06-22 NOTE — Telephone Encounter (Signed)
Called spoke with patient who reported that she is going out of town to [place] from 5/2 - 5/27.  She is requesting to have a script on hand for doxycycline to take with her in case she develops symptoms.  Pt denies any acute symptoms at this time.  Pt uses [facility].  Advised pt that Durene Cal is not available until Monday 4.24.17 and pt reported that she is okay to wait until next week when JN returns.  Please advise Dr Ashok Cordia, thank you.

## 2015-06-26 ENCOUNTER — Other Ambulatory Visit: Payer: Self-pay | Admitting: Adult Health

## 2015-06-26 NOTE — Telephone Encounter (Signed)
lmtcb x1 for pt. 

## 2015-06-26 NOTE — Telephone Encounter (Signed)
She was started on Tikosyn which can potentially interact with Doxycycline as well as other antibiotics and cause heart rhythm problems. The safest thing would be to see a physician there for an assessment of the potential need for an antibiotic if she becomes ill during her travels. Please wish her a safe journey.

## 2015-06-26 NOTE — Telephone Encounter (Signed)
[phone] calling back

## 2015-06-26 NOTE — Telephone Encounter (Signed)
Pt is aware that we will not be sending in Doxy. She did not seem happy about this. Nothing further was needed.

## 2015-06-26 NOTE — Telephone Encounter (Signed)
No. She should only very cautiously start an antibiotic if she is evaluated by a physician at the time of illness.

## 2015-06-26 NOTE — Telephone Encounter (Signed)
Spoke with pt. She is aware of the below information.  JN - are we okay to send in Doxy?

## 2015-07-19 ENCOUNTER — Ambulatory Visit: Payer: Medicare Other | Admitting: Internal Medicine

## 2015-07-19 ENCOUNTER — Ambulatory Visit (INDEPENDENT_AMBULATORY_CARE_PROVIDER_SITE_OTHER): Payer: Medicare Other | Admitting: Internal Medicine

## 2015-07-19 ENCOUNTER — Other Ambulatory Visit (INDEPENDENT_AMBULATORY_CARE_PROVIDER_SITE_OTHER): Payer: Medicare Other

## 2015-07-19 ENCOUNTER — Encounter: Payer: Self-pay | Admitting: Internal Medicine

## 2015-07-19 VITALS — BP 134/82 | HR 86 | Temp 98.8°F | Resp 16 | Ht 65.0 in | Wt 148.0 lb

## 2015-07-19 DIAGNOSIS — I10 Essential (primary) hypertension: Secondary | ICD-10-CM

## 2015-07-19 DIAGNOSIS — E538 Deficiency of other specified B group vitamins: Secondary | ICD-10-CM

## 2015-07-19 DIAGNOSIS — R5383 Other fatigue: Secondary | ICD-10-CM

## 2015-07-19 DIAGNOSIS — K5909 Other constipation: Secondary | ICD-10-CM | POA: Diagnosis not present

## 2015-07-19 DIAGNOSIS — F411 Generalized anxiety disorder: Secondary | ICD-10-CM | POA: Diagnosis not present

## 2015-07-19 LAB — COMPREHENSIVE METABOLIC PANEL
ALT: 9 U/L (ref 0–35)
AST: 12 U/L (ref 0–37)
Albumin: 4.4 g/dL (ref 3.5–5.2)
Alkaline Phosphatase: 72 U/L (ref 39–117)
BUN: 17 mg/dL (ref 6–23)
CO2: 28 mEq/L (ref 19–32)
Calcium: 9.8 mg/dL (ref 8.4–10.5)
Chloride: 106 mEq/L (ref 96–112)
Creatinine, Ser: 0.82 mg/dL (ref 0.40–1.20)
GFR: 72.87 mL/min (ref >60.00)
Glucose, Bld: 94 mg/dL (ref 70–99)
Potassium: 4.2 mEq/L (ref 3.5–5.1)
Sodium: 139 mEq/L (ref 135–145)
Total Bilirubin: 0.4 mg/dL (ref 0.2–1.2)
Total Protein: 7 g/dL (ref 6.0–8.3)

## 2015-07-19 LAB — CBC WITH DIFFERENTIAL/PLATELET
Basophils Absolute: 0 10*3/uL (ref 0.0–0.1)
Basophils Relative: 0.2 % (ref 0.0–3.0)
Eosinophils Absolute: 0.1 10*3/uL (ref 0.0–0.7)
Eosinophils Relative: 1.8 % (ref 0.0–5.0)
HCT: 39.9 % (ref 36.0–46.0)
Hemoglobin: 13.2 g/dL (ref 12.0–15.0)
Lymphocytes Relative: 25.4 % (ref 12.0–46.0)
Lymphs Abs: 2.1 10*3/uL (ref 0.7–4.0)
MCHC: 33 g/dL (ref 30.0–36.0)
MCV: 85.6 fl (ref 78.0–100.0)
Monocytes Absolute: 0.7 10*3/uL (ref 0.1–1.0)
Monocytes Relative: 8.3 % (ref 3.0–12.0)
Neutro Abs: 5.4 10*3/uL (ref 1.4–7.7)
Neutrophils Relative %: 64.3 % (ref 43.0–77.0)
Platelets: 196 10*3/uL (ref 150.0–400.0)
RBC: 4.66 Mil/uL (ref 3.87–5.11)
RDW: 15.5 % (ref 11.5–15.5)
WBC: 8.4 10*3/uL (ref 4.0–10.5)

## 2015-07-19 LAB — T3: T3, Total: 74.4 ng/dL — ABNORMAL LOW (ref 76–181)

## 2015-07-19 LAB — VITAMIN B12: Vitamin B-12: 555 pg/mL (ref 211–911)

## 2015-07-19 LAB — T4, FREE: Free T4: 0.74 ng/dL (ref 0.60–1.60)

## 2015-07-19 LAB — TSH: TSH: 1.32 u[IU]/mL (ref 0.35–4.50)

## 2015-07-19 LAB — FOLATE: Folate: 16.9 ng/mL (ref >5.9)

## 2015-07-19 LAB — MAGNESIUM: Magnesium: 2 mg/dL (ref 1.5–2.5)

## 2015-07-19 MED ORDER — ALPRAZOLAM 0.25 MG PO TABS: 0.2500 mg | ORAL_TABLET | Freq: Two times a day (BID) | ORAL | Status: DC | PRN

## 2015-07-19 NOTE — Patient Instructions (Signed)
Hypertension Hypertension, commonly called high blood pressure, is when the force of blood pumping through your arteries is too strong. Your arteries are the blood vessels that carry blood from your heart throughout your body. A blood pressure reading consists of a higher number over a lower number, such as 110/72. The higher number (systolic) is the pressure inside your arteries when your heart pumps. The lower number (diastolic) is the pressure inside your arteries when your heart relaxes. Ideally you want your blood pressure below 120/80. Hypertension forces your heart to work harder to pump blood. Your arteries may become narrow or stiff. Having untreated or uncontrolled hypertension can cause heart attack, stroke, kidney disease, and other problems. RISK FACTORS Some risk factors for high blood pressure are controllable. Others are not.  Risk factors you cannot control include:   Race. You may be at higher risk if you are African American.  Age. Risk increases with age.  Gender. Men are at higher risk than women before age 45 years. After age 65, women are at higher risk than men. Risk factors you can control include:  Not getting enough exercise or physical activity.  Being overweight.  Getting too much fat, sugar, calories, or salt in your diet.  Drinking too much alcohol. SIGNS AND SYMPTOMS Hypertension does not usually cause signs or symptoms. Extremely high blood pressure (hypertensive crisis) may cause headache, anxiety, shortness of breath, and nosebleed. DIAGNOSIS To check if you have hypertension, your health care provider will measure your blood pressure while you are seated, with your arm held at the level of your heart. It should be measured at least twice using the same arm. Certain conditions can cause a difference in blood pressure between your right and left arms. A blood pressure reading that is higher than normal on one occasion does not mean that you need treatment. If  it is not clear whether you have high blood pressure, you may be asked to return on a different day to have your blood pressure checked again. Or, you may be asked to monitor your blood pressure at home for 1 or more weeks. TREATMENT Treating high blood pressure includes making lifestyle changes and possibly taking medicine. Living a healthy lifestyle can help lower high blood pressure. You may need to change some of your habits. Lifestyle changes may include:  Following the DASH diet. This diet is high in fruits, vegetables, and whole grains. It is low in salt, red meat, and added sugars.  Keep your sodium intake below 2,300 mg per day.  Getting at least 30-45 minutes of aerobic exercise at least 4 times per week.  Losing weight if necessary.  Not smoking.  Limiting alcoholic beverages.  Learning ways to reduce stress. Your health care provider may prescribe medicine if lifestyle changes are not enough to get your blood pressure under control, and if one of the following is true:  You are 18-59 years of age and your systolic blood pressure is above 140.  You are 60 years of age or older, and your systolic blood pressure is above 150.  Your diastolic blood pressure is above 90.  You have diabetes, and your systolic blood pressure is over 140 or your diastolic blood pressure is over 90.  You have kidney disease and your blood pressure is above 140/90.  You have heart disease and your blood pressure is above 140/90. Your personal target blood pressure may vary depending on your medical conditions, your age, and other factors. HOME CARE INSTRUCTIONS    Have your blood pressure rechecked as directed by your health care provider.   Take medicines only as directed by your health care provider. Follow the directions carefully. Blood pressure medicines must be taken as prescribed. The medicine does not work as well when you skip doses. Skipping doses also puts you at risk for  problems.  Do not smoke.   Monitor your blood pressure at home as directed by your health care provider. SEEK MEDICAL CARE IF:   You think you are having a reaction to medicines taken.  You have recurrent headaches or feel dizzy.  You have swelling in your ankles.  You have trouble with your vision. SEEK IMMEDIATE MEDICAL CARE IF:  You develop a severe headache or confusion.  You have unusual weakness, numbness, or feel faint.  You have severe chest or abdominal pain.  You vomit repeatedly.  You have trouble breathing. MAKE SURE YOU:   Understand these instructions.  Will watch your condition.  Will get help right away if you are not doing well or get worse.   This information is not intended to replace advice given to you by your health care provider. Make sure you discuss any questions you have with your health care provider.   Document Released: 02/18/2005 Document Revised: 07/05/2014 Document Reviewed: 12/11/2012 Elsevier Interactive Patient Education 2016 [employer]vier Inc.  

## 2015-07-19 NOTE — Progress Notes (Signed)
Pre visit review using our clinic review tool, if applicable. No additional management support is needed unless otherwise documented below in the visit note. 

## 2015-07-19 NOTE — Progress Notes (Signed)
Subjective:  Patient ID: Elizabeth Mcconnell, female    DOB: 02/19/1946  Age: 71 y.o. MRN: [id number]  CC: Fatigue   HPI Elizabeth Mcconnell presents for f/up - she complains of a one month hx of fatigue and persistent constipation. She treats the constipation with over-the-counter products and gets adequate symptom relief.  Outpatient Prescriptions Prior to Visit  Medication Sig Dispense Refill  . acetaminophen (TYLENOL) 650 MG CR tablet Take 1,300 mg by mouth at bedtime. Second dose during day if needed    . cyanocobalamin 2000 MCG tablet Take 1 tablet (2,000 mcg total) by mouth daily. 90 tablet 3  . diltiazem (CARDIZEM CD) 300 MG 24 hr capsule TAKE 1 CAPSULE BY MOUTH NIGHTLY AT BEDTIME 30 capsule 11  . dofetilide (TIKOSYN) 250 MCG capsule Take 1 capsule (250 mcg total) by mouth 2 (two) times daily. 14 capsule 0  . ELIQUIS 5 MG TABS tablet TAKE 1 TABLET BY MOUTH 2 TIMES DAILY 60 tablet 3  . estradiol (VIVELLE-DOT) 0.05 MG/24HR patch Place 1 patch onto the skin 2 (two) times a week.  12  . fluticasone (FLONASE) 50 MCG/ACT nasal spray PLACE 2 SPRAYS IN EACH NOSTRIL 2 TIMES DAILY 16 g 5  . fluticasone (FLOVENT HFA) 110 MCG/ACT inhaler Inhale 1 puff into the lungs 2 (two) times daily. 1 Inhaler 12  . magnesium oxide (MAG-OX) 400 MG tablet Take 1 tablet (400 mg total) by mouth daily. 30 tablet 6  . Melatonin 3 MG TABS Take 1 tablet by mouth daily as needed. For sleep    . montelukast (SINGULAIR) 10 MG tablet TAKE 1 TABLET BY MOUTH EVERY DAY 30 tablet 2  . saccharomyces boulardii (FLORASTOR) 250 MG capsule Take 1 capsule (250 mg total) by mouth 2 (two) times daily. 60 capsule 1  . XOPENEX HFA 45 MCG/ACT inhaler USE ONE TO TWO PUFFS EVERY 4 HOURS AS NEEDED (Patient taking differently: USE ONE TO TWO PUFFS EVERY 4 HOURS AS NEEDED SOB) 15 g 3  . diazepam (VALIUM) 2 MG tablet Take 1 tablet (2 mg total) by mouth every 12 (twelve) hours as needed for anxiety. 60 tablet 1   No  facility-administered medications prior to visit.    ROS Review of Systems  Constitutional: Positive for fatigue. Negative for fever, chills, diaphoresis, appetite change and unexpected weight change.  HENT: Negative.  Negative for sore throat and trouble swallowing.   Eyes: Negative.   Respiratory: Negative.  Negative for cough, choking, chest tightness, shortness of breath and stridor.   Cardiovascular: Negative.  Negative for chest pain, palpitations and leg swelling.  Gastrointestinal: Positive for constipation. Negative for nausea, vomiting, abdominal pain, diarrhea and blood in stool.  Endocrine: Negative.   Genitourinary: Negative.  Negative for dysuria and difficulty urinating.  Musculoskeletal: Positive for arthralgias. Negative for myalgias, back pain, joint swelling and neck pain.  Skin: Negative.  Negative for color change and rash.  Allergic/Immunologic: Negative.   Neurological: Negative.  Negative for dizziness, light-headedness and numbness.  Hematological: Negative.  Negative for adenopathy. Does not bruise/bleed easily.  Psychiatric/Behavioral: Positive for sleep disturbance. Negative for suicidal ideas, self-injury, dysphoric mood and decreased concentration. The patient is nervous/anxious.     Objective:  BP 134/82 mmHg  Pulse 86  Temp(Src) 98.8 F (37.1 C) (Oral)  Resp 16  Ht 5\' 5"  (1.651 m)  Wt 148 lb (67.132 kg)  BMI 24.63 kg/m2  SpO2 97%  BP Readings from Last 3 Encounters:  07/19/15 134/82  05/30/15 130/86  05/18/15 102/62    Wt Readings from Last 3 Encounters:  07/19/15 148 lb (67.132 kg)  05/30/15 139 lb (63.05 kg)  05/18/15 137 lb 9.6 oz (62.415 kg)    Physical Exam  Constitutional: She is oriented to person, place, and time. No distress.  HENT:  Mouth/Throat: Oropharynx is clear and moist. No oropharyngeal exudate.  Eyes: Conjunctivae are normal. Right eye exhibits no discharge. Left eye exhibits no discharge. No scleral icterus.  Neck:  Normal range of motion. Neck supple. No JVD present. No tracheal deviation present. No thyromegaly present.  Cardiovascular: Normal rate, regular rhythm, normal heart sounds and intact distal pulses.  Exam reveals no gallop and no friction rub.   No murmur heard. EKG ---  Sinus  Rhythm  Low voltage in precordial leads.   -Old inferior infarct.   ABNORMAL - no change when compared to EKG from 2 months ago    Pulmonary/Chest: Effort normal and breath sounds normal. No stridor. No respiratory distress. She has no wheezes. She has no rales. She exhibits no tenderness.  Abdominal: Soft. Bowel sounds are normal. She exhibits no distension and no mass. There is no tenderness. There is no rebound and no guarding.  Musculoskeletal: Normal range of motion. She exhibits no edema or tenderness.  Lymphadenopathy:    She has no cervical adenopathy.  Neurological: She is oriented to person, place, and time.  Skin: Skin is warm and dry. No rash noted. She is not diaphoretic. No erythema. No pallor.  Psychiatric: She has a normal mood and affect. Her behavior is normal. Judgment and thought content normal.  Vitals reviewed.   Lab Results  Component Value Date   WBC 8.4 07/19/2015   HGB 13.2 07/19/2015   HCT 39.9 07/19/2015   PLT 196.0 07/19/2015   GLUCOSE 94 07/19/2015   CHOL 261* 05/30/2015   TRIG 192.0* 05/30/2015   HDL 65.70 05/30/2015   LDLCALC 157* 05/30/2015   ALT 9 07/19/2015   AST 12 07/19/2015   NA 139 07/19/2015   K 4.2 07/19/2015   CL 106 07/19/2015   CREATININE 0.82 07/19/2015   BUN 17 07/19/2015   CO2 28 07/19/2015   TSH 1.32 07/19/2015   INR 1.30 05/01/2015   HGBA1C 5.5 05/03/2015    Dg Chest 2 View  05/18/2015  CLINICAL DATA:  Cough and shortness of breath. EXAM: CHEST  2 VIEW COMPARISON:  10/20/2014 chest radiograph. FINDINGS: Stable cardiomediastinal silhouette with normal heart size. No pneumothorax. No pleural effusion. There is stable left upper lobe scarring with  left superior hilar retraction and biapical pleural-parenchymal scarring asymmetric to the left, not appreciably changed back to 10/18/2004. No pulmonary edema. No acute consolidative airspace disease. Stable mild scarring at the left lung base. IMPRESSION: 1. No acute consolidative airspace disease to suggest a pneumonia. 2. Stable chronic left upper lobe scarring with left superior hilar retraction, probably the sequela of remote granulomatous disease. Electronically Signed   By: Ilona Sorrel M.D.   On: 05/18/2015 18:40    Assessment & Plan:   Elizabeth Mcconnell was seen today for fatigue.  Diagnoses and all orders for this visit:  GAD (generalized anxiety disorder) -     ALPRAZolam (XANAX) 0.25 MG tablet; Take 1 tablet (0.25 mg total) by mouth 2 (two) times daily as needed for anxiety.  Essential hypertension- her blood pressure is well-controlled, electrolytes and renal function are normal. -     TSH; Future -     Comprehensive metabolic panel; Future -  Magnesium; Future  Other constipation- her labs do not reveal any secondary metabolic causes for constipation, she will continue using over-the-counter symptom relief, she did not want me to prescribe a medication for this -     TSH; Future -     T4, free; Future -     T3; Future -     Comprehensive metabolic panel; Future -     Magnesium; Future  Other fatigue- her exam is normal, her EKG is unchanged from prior EKGs, labs do not show any secondary causes for fatigue, at this time I do not have an explanation for fatigue, she will let me know if she develops any new or worsening symptoms.       -     TSH; Future -     T4, free; Future -     T3; Future -     CBC with Differential/Platelet; Future -     EKG 12-Lead  B12 nutritional deficiency- improvement noted  -     Folate; Future -     Vitamin B12; Future   I have discontinued Elizabeth Mcconnell's diazepam. I am also having her start on ALPRAZolam. Additionally, I am having her maintain  her Melatonin, acetaminophen, saccharomyces boulardii, magnesium oxide, dofetilide, XOPENEX HFA, diltiazem, estradiol, fluticasone, cyanocobalamin, montelukast, ELIQUIS, and fluticasone.  Meds ordered this encounter  Medications  . ALPRAZolam (XANAX) 0.25 MG tablet    Sig: Take 1 tablet (0.25 mg total) by mouth 2 (two) times daily as needed for anxiety.    Dispense:  60 tablet    Refill:  2     Follow-up: Return in about 4 months (around 11/19/2015).  [name], MD

## 2015-07-20 ENCOUNTER — Encounter: Payer: Self-pay | Admitting: Internal Medicine

## 2015-07-21 ENCOUNTER — Telehealth: Payer: Self-pay | Admitting: Pulmonary Disease

## 2015-07-21 NOTE — Telephone Encounter (Signed)
PA was initiated through Cover My Meds. Key: [id number] Will await PA decision.

## 2015-07-24 NOTE — Telephone Encounter (Signed)
PA still in process.  

## 2015-07-24 NOTE — Telephone Encounter (Signed)
Received fax from [employer] stating Levalbuterol has been approved until 03/03/2016. Pharmacy informed.

## 2015-08-03 DIAGNOSIS — M8580 Other specified disorders of bone density and structure, unspecified site: Secondary | ICD-10-CM

## 2015-08-03 HISTORY — DX: Other specified disorders of bone density and structure, unspecified site: M85.80

## 2015-08-07 ENCOUNTER — Ambulatory Visit (INDEPENDENT_AMBULATORY_CARE_PROVIDER_SITE_OTHER): Payer: Medicare Other | Admitting: Gynecology

## 2015-08-07 ENCOUNTER — Encounter: Payer: Self-pay | Admitting: Gynecology

## 2015-08-07 VITALS — BP 124/80 | Ht 65.0 in | Wt 149.0 lb

## 2015-08-07 DIAGNOSIS — Z01419 Encounter for gynecological examination (general) (routine) without abnormal findings: Secondary | ICD-10-CM | POA: Diagnosis not present

## 2015-08-07 DIAGNOSIS — N898 Other specified noninflammatory disorders of vagina: Secondary | ICD-10-CM

## 2015-08-07 DIAGNOSIS — N952 Postmenopausal atrophic vaginitis: Secondary | ICD-10-CM

## 2015-08-07 DIAGNOSIS — M8580 Other specified disorders of bone density and structure, unspecified site: Secondary | ICD-10-CM

## 2015-08-07 DIAGNOSIS — Z79890 Hormone replacement therapy (postmenopausal): Secondary | ICD-10-CM

## 2015-08-07 MED ORDER — ESTRADIOL 0.05 MG/24HR TD PTTW: 1.0000 | MEDICATED_PATCH | TRANSDERMAL | Status: DC

## 2015-08-07 NOTE — Progress Notes (Signed)
    Elizabeth Mcconnell 11/19/1945 [id number]        70 y.o.  [id number]  For breast and pelvic exam. Several issues noted below.  Past medical history,surgical history, problem list, medications, allergies, family history and social history were all reviewed and documented as reviewed in the EPIC chart.  ROS:  Performed with pertinent positives and negatives included in the history, assessment and plan.   Additional significant findings :  none   Exam: Elizabeth Mcconnell assistant Filed Vitals:   08/07/15 1206  BP: 124/80  Height: 5\' 5"  (1.651 m)  Weight: 149 lb (67.586 kg)   General appearance:  Normal affect, orientation and appearance. Skin: Grossly normal HEENT: Without gross lesions.  No cervical or supraclavicular adenopathy. Thyroid normal.  Lungs:  Clear without wheezing, rales or rhonchi Cardiac: RR, without RMG Abdominal:  Soft, nontender, without masses, guarding, rebound, organomegaly or hernia Breasts:  Examined lying and sitting without masses, retractions, discharge or axillary adenopathy. Pelvic:  Ext/BUS/vagina with atrophic changes.  2 cm midline posterior fourchette submucosal cyst stable from prior exams.  Adnexa without masses or tenderness    Anus and perineum normal   Rectovaginal normal sphincter tone without palpated masses or tenderness.    Assessment/Plan:  70 y.o. [id number] female for breast and pelvic exam.   1. Postmenopausal/atrophic genital changes/HRT. Status post TAH/BSO for endometriosis. On Vivelle 0.05 mg patches doing well.  Wants to continue. I again reviewed the risks to include increased risk of stroke heart attack DVT and breast cancer. Use in patients in their 71s with possible stroke risks also discussed. Patient's going to try to wean this coming year by using a half a patch and see how she does with this. Refill 1 year provided. 2. Vaginal cyst. Present for years stable. Not bothersome to the patient. Continue to follow. 3. Mammography 06/2015.  Continue with annual mammography when due. SBE monthly reviewed. 4. Osteopenia. DEXA 2015 T score -2.3 FRAX 17%/3%. Stable from prior DEXA. We discussed treatment options in the past but prefered to monitor and continuing her ERT. Repeat DEXA now and she will schedule. Increase calcium vitamin D. 5. Colonoscopy 2015. Repeat at their recommended interval. 6. Pap smear 2011. No Pap smear done before. No history of abnormal Pap smears. We both have agreed to stop screening per current screening guidelines based on age and hysterectomy history. 7. Maintenance. No routine lab work done as patient reports this done at her primary physician's office. Follow up 1 year, sooner as needed.  [name] MD, 12:37 PM 08/07/2015

## 2015-08-07 NOTE — Patient Instructions (Signed)
Follow up for the bone density as scheduled. Try half a patch twice weekly this coming fall to see if you do well with the lower dose. If you do not then used the full patch.

## 2015-08-16 ENCOUNTER — Encounter (HOSPITAL_COMMUNITY): Payer: Self-pay | Admitting: Nurse Practitioner

## 2015-08-16 ENCOUNTER — Ambulatory Visit (HOSPITAL_COMMUNITY)
Admission: RE | Admit: 2015-08-16 | Discharge: 2015-08-16 | Disposition: A | Discharge status: Home / Self Care | Payer: Medicare Other | Source: Ambulatory Visit | Attending: Nurse Practitioner | Admitting: Nurse Practitioner

## 2015-08-16 VITALS — BP 132/76 | HR 81 | Ht 65.0 in | Wt 148.8 lb

## 2015-08-16 DIAGNOSIS — I480 Paroxysmal atrial fibrillation: Secondary | ICD-10-CM | POA: Diagnosis not present

## 2015-08-16 DIAGNOSIS — R0789 Other chest pain: Secondary | ICD-10-CM | POA: Insufficient documentation

## 2015-08-16 DIAGNOSIS — I4891 Unspecified atrial fibrillation: Secondary | ICD-10-CM | POA: Diagnosis present

## 2015-08-16 DIAGNOSIS — I4510 Unspecified right bundle-branch block: Secondary | ICD-10-CM | POA: Insufficient documentation

## 2015-08-17 ENCOUNTER — Encounter (HOSPITAL_COMMUNITY): Payer: Self-pay | Admitting: Nurse Practitioner

## 2015-08-17 NOTE — Progress Notes (Signed)
Patient ID: Elizabeth Mcconnell, female   DOB: December 26, 1945, 70 y.o.   MRN: [id number]     Primary Care Physician: [name], MD Referring Physician: Hansel Starling   Elizabeth Mcconnell is a 70 y.o. female with a h/o PAF on tikosyn for f/u in the afib clinic. She continues on tikosyn with  very little afib burden. Continues on eliquis for chadsvasc score of . Reports concern because she has had intermittent  upper arm tightness and is concerned re CAD. She was scheduled to have a stress test in the past but it was cancelled after she went into rapid afib at least 2.  Today, she denies symptoms of palpitations, chest pain, shortness of breath, orthopnea, PND, lower extremity edema, dizziness, presyncope, syncope, or neurologic sequela. The patient is tolerating medications without difficulties and is otherwise without complaint today.   Past Medical History  Diagnosis Date  . Tobacco abuse   . Allergic rhinitis   . Oral candidiasis   . Atrial flutter ([place])     failed cardioversion 11/2012; s/p ablation 02-02-2013 by Dr Rayann Heman  . Asthma   . Pneumothorax on left 1996    left lower lung collapse s/p resection  . Osteopenia 08/2013     T score -2.3 FRAX 17%/3.0% stable from prior DEXA  . ALLERGIC RHINITIS   . Endometriosis   . Adenomatous polyp of colon 10/2013    f/u colo 10/2018  . Atrial fibrillation with RVR ([place])   . Essential hypertension 05/30/2014  . Diverticulosis   . Chronic bronchitis ([place])     "I'll have it most years" (02/14/2015)  . Arthritis     "neck, back, hands" (02/14/2015)  . ANXIETY     "situational" (02/14/2015)   Past Surgical History  Procedure Laterality Date  . Lung removal, partial Left 02/1965    LLL  . Cesarean section  1976; 1978  . Total abdominal hysterectomy w/ bilateral salpingoophorectomy  1984    Endometriosis  . Cardioversion N/A 11/27/2012    Procedure: CARDIOVERSION;  Surgeon: Thayer Headings, MD;  Location[facility] ENDOSCOPY;  Service:  Cardiovascular;  Laterality: N/A;  . Atrial flutter ablation N/A 02/02/2013    Procedure: ATRIAL FLUTTER ABLATION;  Surgeon: Coralyn Mark, MD;  Location: [facility] CATH LAB;  Service: Cardiovascular;  Laterality: N/A;  . Appendectomy  1978  . Laparoscopic partial colectomy N/A 05/04/2015    Procedure: LAPAROSCOPIC LOW ANTERIOR RESECTION LAPAROSCOPIC LYSIS OF ADHESIONS, SPLENIC FLEXURE MOBILIZATION;  Surgeon: Michael Boston, MD;  Location: [facility] ORS;  Service: General;  Laterality: N/A;  . Abdominal hysterectomy      Current Outpatient Prescriptions  Medication Sig Dispense Refill  . acetaminophen (TYLENOL) 650 MG CR tablet Take 1,300 mg by mouth at bedtime. Second dose during day if needed    . ALPRAZolam (XANAX) 0.25 MG tablet Take 1 tablet (0.25 mg total) by mouth 2 (two) times daily as needed for anxiety. 60 tablet 2  . cyanocobalamin 2000 MCG tablet Take 1 tablet (2,000 mcg total) by mouth daily. 90 tablet 3  . diltiazem (CARDIZEM CD) 300 MG 24 hr capsule TAKE 1 CAPSULE BY MOUTH NIGHTLY AT BEDTIME 30 capsule 11  . dofetilide (TIKOSYN) 250 MCG capsule Take 1 capsule (250 mcg total) by mouth 2 (two) times daily. 14 capsule 0  . ELIQUIS 5 MG TABS tablet TAKE 1 TABLET BY MOUTH 2 TIMES DAILY 60 tablet 3  . estradiol (VIVELLE-DOT) 0.05 MG/24HR patch Place 1 patch (0.05 mg total) onto the skin 2 (two)  times a week. 8 patch 12  . fluticasone (FLONASE) 50 MCG/ACT nasal spray PLACE 2 SPRAYS IN EACH NOSTRIL 2 TIMES DAILY 16 g 5  . fluticasone (FLOVENT HFA) 110 MCG/ACT inhaler Inhale 1 puff into the lungs 2 (two) times daily. 1 Inhaler 12  . magnesium oxide (MAG-OX) 400 MG tablet Take 1 tablet (400 mg total) by mouth daily. 30 tablet 6  . Melatonin 3 MG TABS Take 1 tablet by mouth daily as needed. For sleep    . montelukast (SINGULAIR) 10 MG tablet TAKE 1 TABLET BY MOUTH EVERY DAY 30 tablet 2  . XOPENEX HFA 45 MCG/ACT inhaler USE ONE TO TWO PUFFS EVERY 4 HOURS AS NEEDED (Patient taking differently: USE ONE TO TWO  PUFFS EVERY 4 HOURS AS NEEDED SOB) 15 g 3   No current facility-administered medications for this encounter.    Allergies  Allergen Reactions  . Tramadol Other (See Comments)    Severe dizziness, weakness  . Amoxicillin-Pot Clavulanate Nausea And Vomiting  . Flagyl [Metronidazole] Nausea And Vomiting  . Flecainide     dizziness  . Omnicef [Cefdinir] Nausea And Vomiting  . Avelox [Moxifloxacin] Hives, Itching and Rash    Social History   Social History  . Marital Status: Married    Spouse Name: N/A  . Number of Children: 2  . Years of Education: N/A   Occupational History  . 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     Social History Main Topics  . Smoking status: Former Smoker -- 0.25 packs/day for 43 years    Types: Cigarettes  . Smokeless tobacco: Never Used  . Alcohol Use: 8.4 oz/week    14 Standard drinks or equivalent per week     Comment: daily wine  . Drug Use: No  . Sexual Activity: Yes    Birth Control/ Protection: Surgical, Post-menopausal     Comment: HYST-1st intercourse 70 yo-Fewer than 5 partners   Other Topics Concern  . Not on file   Social History Narrative   Lives with spouse;    2 children      [facility]:   She is from [facility]. 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 Previously has attended business school. Previously did office work and Programmer, applications at [employer]. She has also worked for [employer]. She does have a cat currently. She has indoor plants. No mold exposure.    Family History  Problem Relation Age of Onset  . Stroke Father 80  . Hypertension Father   . Heart disease Father   . Diabetes Mother   . Colon cancer Neg Hx   . Rectal cancer Neg Hx   . Stomach cancer Neg Hx   . Asthma Maternal Grandfather   . Asthma Child     ROS- All systems are reviewed and negative except as per the HPI above  Physical Exam: Filed Vitals:   08/16/15 1002  BP: 132/76  Pulse: 81  Height: 5\' 5"  (1.651  m)  Weight: 148 lb 12.8 oz (67.495 kg)    GEN- The patient is well appearing, alert and oriented x 3 today.   Head- normocephalic, atraumatic Eyes-  Sclera clear, conjunctiva pink Ears- hearing intact Oropharynx- clear Neck- supple, no JVP Lymph- no cervical lymphadenopathy Lungs- Clear to ausculation bilaterally, normal work of breathing Heart- Regular rate and rhythm, no murmurs, rubs or gallops, PMI not laterally displaced GI- soft, NT, ND, + BS Extremities- no clubbing, cyanosis, or edema  MS- no significant deformity or atrophy Skin- no rash or lesion Psych- euthymic mood, full affect Neuro- strength and sensation are intact  EKG-NSR at 81 bpm, pr int 188 ms, qrs int 96 ms, qtc 464 ms Epic records reviewed Labs- 5/17-K+4.2, creat 0.82 ms, mag 2.0  Assessment and Plan: 1. PAF Continue eliquis 5 mg bid Continue tikosyn 250 mg bid  2. Left upper arm discomfort Schedule for lexi myoview   F/u in afib clinic in 3 months    [name] C. Coleton Woon, [facility] [address] [place], [place] [address] [phone]

## 2015-08-22 ENCOUNTER — Encounter: Payer: Self-pay | Admitting: Gynecology

## 2015-08-22 ENCOUNTER — Ambulatory Visit (INDEPENDENT_AMBULATORY_CARE_PROVIDER_SITE_OTHER): Payer: Medicare Other

## 2015-08-22 ENCOUNTER — Telehealth: Payer: Self-pay | Admitting: Gynecology

## 2015-08-22 ENCOUNTER — Other Ambulatory Visit: Payer: Self-pay | Admitting: Gynecology

## 2015-08-22 DIAGNOSIS — M899 Disorder of bone, unspecified: Secondary | ICD-10-CM

## 2015-08-22 DIAGNOSIS — M8580 Other specified disorders of bone density and structure, unspecified site: Secondary | ICD-10-CM

## 2015-08-22 NOTE — Telephone Encounter (Signed)
Tell patient that the recent bone density is stable from prior study. It does show a elevated hip fracture risk as calculated which reflects her age along with the bone density values. 2 options would be to add a medication such as Fosamax to address his increased hip fracture risk. The second option would be to stay on the estrogen and repeat the bone density in 2 years understanding a slight increased risk of hip fracture. Recommend office visit if she wants to discuss the treatment options.

## 2015-08-25 ENCOUNTER — Ambulatory Visit (HOSPITAL_COMMUNITY): Payer: Medicare Other | Attending: Cardiovascular Disease

## 2015-08-25 DIAGNOSIS — I10 Essential (primary) hypertension: Secondary | ICD-10-CM | POA: Insufficient documentation

## 2015-08-25 DIAGNOSIS — I4891 Unspecified atrial fibrillation: Secondary | ICD-10-CM | POA: Diagnosis not present

## 2015-08-25 DIAGNOSIS — R0789 Other chest pain: Secondary | ICD-10-CM | POA: Diagnosis present

## 2015-08-25 LAB — MYOCARDIAL PERFUSION IMAGING
LV dias vol: 70 mL (ref 46–106)
LV sys vol: 13 mL
Peak HR: 85 {beats}/min
RATE: 0.33
Rest HR: 62 {beats}/min
SDS: 4
SRS: 1
SSS: 5
TID: 0.85

## 2015-08-25 MED ORDER — REGADENOSON 0.4 MG/5ML IV SOLN
0.4000 mg | Freq: Once | INTRAVENOUS | Status: AC
  Administered 2015-08-25: 0.4 mg via INTRAVENOUS

## 2015-08-25 MED ORDER — TECHNETIUM TC 99M TETROFOSMIN IV KIT
31.6000 | PACK | Freq: Once | INTRAVENOUS | Status: AC | PRN
  Administered 2015-08-25: 32 via INTRAVENOUS
  Filled 2015-08-25: qty 32

## 2015-08-25 MED ORDER — TECHNETIUM TC 99M TETROFOSMIN IV KIT
10.6000 | PACK | Freq: Once | INTRAVENOUS | Status: AC | PRN
  Administered 2015-08-25: 11 via INTRAVENOUS
  Filled 2015-08-25: qty 11

## 2015-08-25 NOTE — Telephone Encounter (Signed)
Left message for pt to call.

## 2015-08-25 NOTE — Telephone Encounter (Signed)
Pt informed with the below note, pt will call back once she decides.

## 2015-09-11 ENCOUNTER — Other Ambulatory Visit: Payer: Self-pay | Admitting: Pulmonary Disease

## 2015-09-25 ENCOUNTER — Encounter: Payer: Self-pay | Admitting: Pulmonary Disease

## 2015-09-25 ENCOUNTER — Ambulatory Visit (INDEPENDENT_AMBULATORY_CARE_PROVIDER_SITE_OTHER): Payer: Medicare Other | Admitting: Pulmonary Disease

## 2015-09-25 VITALS — BP 118/72 | HR 67 | Ht 64.5 in | Wt 150.0 lb

## 2015-09-25 DIAGNOSIS — J4540 Moderate persistent asthma, uncomplicated: Secondary | ICD-10-CM

## 2015-09-25 DIAGNOSIS — J309 Allergic rhinitis, unspecified: Secondary | ICD-10-CM

## 2015-09-25 DIAGNOSIS — F17200 Nicotine dependence, unspecified, uncomplicated: Secondary | ICD-10-CM | POA: Diagnosis not present

## 2015-09-25 DIAGNOSIS — R600 Localized edema: Secondary | ICD-10-CM

## 2015-09-25 DIAGNOSIS — Z720 Tobacco use: Secondary | ICD-10-CM

## 2015-09-25 LAB — PULMONARY FUNCTION TEST
DL/VA % pred: 67 %
DL/VA: 3.28 ml/min/mmHg/L
DLCO cor % pred: 58 %
DLCO cor: 14.67 ml/min/mmHg
DLCO unc % pred: 59 %
DLCO unc: 14.85 ml/min/mmHg
FEF 25-75 Pre: 0.61 L/sec
FEF2575-%Pred-Pre: 33 %
FEV1-%Pred-Pre: 70 %
FEV1-Pre: 1.58 L
FEV1FVC-%Pred-Pre: 72 %
FEV6-%Pred-Pre: 93 %
FEV6-Pre: 2.67 L
FEV6FVC-%Pred-Pre: 98 %
FVC-%Pred-Pre: 95 %
FVC-Pre: 2.85 L
Pre FEV1/FVC ratio: 55 %
Pre FEV6/FVC Ratio: 94 %
RV % pred: 73 %
RV: 1.66 L
TLC % pred: 92 %
TLC: 4.75 L

## 2015-09-25 MED ORDER — DOXYCYCLINE HYCLATE 100 MG PO TABS: 100.0000 mg | ORAL_TABLET | Freq: Two times a day (BID) | ORAL | 0 refills | Status: DC

## 2015-09-25 NOTE — Progress Notes (Signed)
Test reviewed.  

## 2015-09-25 NOTE — Progress Notes (Signed)
Subjective:    Patient ID: Elizabeth Mcconnell, female    DOB: January 07, 1946, 70 y.o.   MRN: [id number]  C.C.:  Follow-up for Moderate, Persistent Asthma, Allergic Rhinitis, & Ongoing Tobacco Use.  HPI Moderate, Persistent Asthma: Prescribed Flovent HFA & Singulair. Patient switched to Flovent by her PCP due to concerns with interaction with her atrial fibrillation. Patient with mild obstruction on spirometry. No exacerbations since last appointment. Reports mild intermittently. No nocturnal awakenings with any breathing problems. She is now using her Xopenex rescue inhaler every 2-3 days.   Allergic Rhinitis: On Flonase & Singulair. Denies any significant sinus congestion or drainage.   Ongoing Tobacco Use:  She is still smoking intermittently & sporadically. She reports she is able to stop for long periods, months, at a time. Her husband also smokes intermittently. Previously tried to quit using nicotine patches.   Review of Systems Denies any chest pain or pressure. Reports she transiently had left lower extremity edema after last trip that has resolved. No fever, chills, or sweats. No nausea or emesis.   Allergies  Allergen Reactions  . Tramadol Other (See Comments)    Severe dizziness, weakness  . Amoxicillin-Pot Clavulanate Nausea And Vomiting  . Flagyl [Metronidazole] Nausea And Vomiting  . Flecainide     dizziness  . Omnicef [Cefdinir] Nausea And Vomiting  . Avelox [Moxifloxacin] Hives, Itching and Rash    Current Outpatient Prescriptions on File Prior to Visit  Medication Sig Dispense Refill  . acetaminophen (TYLENOL) 650 MG CR tablet Take 1,300 mg by mouth at bedtime. Second dose during day if needed    . ALPRAZolam (XANAX) 0.25 MG tablet Take 1 tablet (0.25 mg total) by mouth 2 (two) times daily as needed for anxiety. 60 tablet 2  . cyanocobalamin 2000 MCG tablet Take 1 tablet (2,000 mcg total) by mouth daily. 90 tablet 3  . diltiazem (CARDIZEM CD) 300 MG 24 hr capsule TAKE  1 CAPSULE BY MOUTH NIGHTLY AT BEDTIME 30 capsule 11  . dofetilide (TIKOSYN) 250 MCG capsule Take 1 capsule (250 mcg total) by mouth 2 (two) times daily. 14 capsule 0  . ELIQUIS 5 MG TABS tablet TAKE 1 TABLET BY MOUTH 2 TIMES DAILY 60 tablet 3  . estradiol (VIVELLE-DOT) 0.05 MG/24HR patch Place 1 patch (0.05 mg total) onto the skin 2 (two) times a week. 8 patch 12  . fluticasone (FLONASE) 50 MCG/ACT nasal spray PLACE 2 SPRAYS IN EACH NOSTRIL 2 TIMES DAILY (Patient taking differently: PLACE 1 SPRAY IN EACH NOSTRIL 2 TIMES DAILY) 16 g 5  . fluticasone (FLOVENT HFA) 110 MCG/ACT inhaler Inhale 1 puff into the lungs 2 (two) times daily. 1 Inhaler 12  . magnesium oxide (MAG-OX) 400 MG tablet Take 1 tablet (400 mg total) by mouth daily. 30 tablet 6  . Melatonin 3 MG TABS Take 1 tablet by mouth daily as needed. For sleep    . montelukast (SINGULAIR) 10 MG tablet TAKE 1 TABLET BY MOUTH EVERY DAY 30 tablet 2  . XOPENEX HFA 45 MCG/ACT inhaler USE ONE TO TWO PUFFS EVERY 4 HOURS AS NEEDED (Patient taking differently: USE ONE TO TWO PUFFS EVERY 4 HOURS AS NEEDED SOB) 15 g 3   No current facility-administered medications on file prior to visit.     Past Medical History:  Diagnosis Date  . Adenomatous polyp of colon 10/2013   f/u colo 10/2018  . Allergic rhinitis   . ALLERGIC RHINITIS   . ANXIETY    "situational" (02/14/2015)  .  Arthritis    "neck, back, hands" (02/14/2015)  . Asthma   . Atrial fibrillation with RVR ([place])   . Atrial flutter ([place])    failed cardioversion 11/2012; s/p ablation 02-02-2013 by Dr Rayann Heman  . Chronic bronchitis ([place])    "I'll have it most years" (02/14/2015)  . Diverticulosis   . Endometriosis   . Essential hypertension 05/30/2014  . Oral candidiasis   . Osteopenia 08/2015   T score -1.7 FRAX 14%/4.4% overall stable from prior DEXA.  [name] Pneumothorax on left 1996   left lower lung collapse s/p resection  . Tobacco abuse     Past Surgical History:  Procedure Laterality Date    . ABDOMINAL HYSTERECTOMY    . APPENDECTOMY  1978  . ATRIAL FLUTTER ABLATION N/A 02/02/2013   Procedure: ATRIAL FLUTTER ABLATION;  Surgeon: Coralyn Mark, MD;  Location: [facility];  Service: Cardiovascular;  Laterality: N/A;  . CARDIOVERSION N/A 11/27/2012   Procedure: CARDIOVERSION;  Surgeon: Thayer Headings, MD;  Location: [facility];  Service: Cardiovascular;  Laterality: N/A;  . [place]; 1978  . LAPAROSCOPIC PARTIAL COLECTOMY N/A 05/04/2015   Procedure: LAPAROSCOPIC LOW ANTERIOR RESECTION LAPAROSCOPIC LYSIS OF ADHESIONS, SPLENIC FLEXURE MOBILIZATION;  Surgeon: Michael Boston, MD;  Location: [facility];  Service: General;  Laterality: N/A;  . LUNG REMOVAL, PARTIAL Left 02/1965   LLL  . TOTAL ABDOMINAL HYSTERECTOMY W/ BILATERAL SALPINGOOPHORECTOMY  1984   Endometriosis    Family History  Problem Relation Age of Onset  . Stroke Father 54  . Hypertension Father   . Heart disease Father   . Diabetes Mother   . Colon cancer Neg Hx   . Rectal cancer Neg Hx   . Stomach cancer Neg Hx   . Asthma Maternal Grandfather   . Asthma Child     Social History   Social History  . Marital status: Married    Spouse name: N/A  . Number of children: 2  . Years of education: N/A   Occupational History  . 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  [employer]   Social History Main Topics  . Smoking status: Former Smoker    Packs/day: 0.25    Years: 43.00    Types: Cigarettes  . Smokeless tobacco: Never Used  . Alcohol use 8.4 oz/week    14 Standard drinks or equivalent per week     Comment: daily wine  . Drug use: No  . Sexual activity: Yes    Birth control/ protection: Surgical, Post-menopausal     Comment: HYST-1st intercourse 70 yo-Fewer than 5 partners   Other Topics Concern  . None   Social History Narrative   Lives with spouse;    2 children      [facility]:   She is from [place]. 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  Previously has attended business school. Previously did office work and Programmer, applications at [employer]. She has also worked for [employer]. She does have a cat currently. She has indoor plants. No mold exposure.      Objective:   Physical Exam BP 118/72 (BP Location: Left Arm, Patient Position: Sitting, Cuff Size: Normal)   Pulse 67   Ht 5' 4.5" (1.638 m)   Wt 150 lb (68 kg)   SpO2 97%   BMI 25.35 kg/m  General:  Awake. No distress. Appears comfortable. Integument:  Warm & dry. No rash on exposed skin. HEENT:  Moist mucus membranes. No oral ulcers. Mild bilateral nasal turbinate swelling. Cardiovascular:  Regular rate. No edema today. No appreciable JVD.  Pulmonary:  Clear bilaterally on auscultation. Normal work of breathing on room air. Speaking in complete sentences. Abdomen: Soft. Normal bowel sounds. Nontender.  PFT 09/25/15: FVC 2.85 L (95%) FEV1 1.58 L (70%) FEV1/FVC 0.55 FEF 25-75 0.61 L (33%) TLC 4.75 L (92%) RV 73% DLCO uncorrected 58%  IMAGING CXR PA/LAT 08/02/14 (per radiologist): No active disease. Hyperinflation. Fibrotic changes LUL.  CARDIAC EKG (08/16/15):  RBB & QTc 478ms.  TTE (08/05/12): LV with mild basal septal hypertrophy. EF 60-65%. Normal regional wall motion. Grade 1 diastolic dysfunction. LA & RA normal in size. RV normal in size & function. No aortic stenosis or regurg. No mitral stenosis & trivial regurg. No pulmonic regurg.     Assessment & Plan:  70 year old female with underlying moderate, persistent asthma. Patient's spirometry today does show mild airway obstruction likely related to her ongoing tobacco use. Her lung volumes are normal despite a reduced carbon monoxide diffusion capacity that is likely multifactorial in etiology. Symptomatically she seems to be well-controlled with regards to her underlying asthma as well as allergic rhinitis therefore I feel the escalating her intranasal steroid therapy is reasonable at this time. I am continuing her  on her current dose of inhaled steroid therapy but did caution we may need to either increase her steroid dose or possibly resume previous combination medication if her frequency of rescue inhaler use increases. Also concerning is the fact that she did have localized edema in her left lower extremity after a recent trip. There is no physical finding of edema on exam today but I believe this would be reasonable to investigate with duplex imaging. I instructed the patient to contact my office if she had any new breathing problems or questions before next appointment and also gave her an emergency prescription of doxycycline to take with her as she travels [place].  1. Moderate, Persistent Asthma: Symptomatically well controlled on Flovent HFA. Continuing 1 spray twice daily. Repeat spirometry & DLCO at next appointment. Continuing Singulair. Recommended she could go up to 2 inhalations twice daily if she experiences any worsening symptoms. Patient given emergency prescription for doxycycline with upcoming trip & already has emergency prednisone. Also checking 6 minute walk test at next appointment with reduced carbon monoxide diffusion capacity. 2. Allergic Rhinitis: Decreasing Flonase to one spray each nostril twice daily. Recommended she contact me for any new symptoms. Continuing Singulair. 3. Ongoing Tobacco Use: Patient counseled for over 3 minutes on the need for complete tobacco cessation. Recommended trying nicotine gum before Wellbutrin or Chantix given potential for medication interaction. 4. Localized Edema:  Checking venous duplex LLE. Patient continuing on systemic anticoagulation. 5. Health Maintenance: Tdap February 2014, Prevnar April 2016, & Pneumovax February 2016. Recommended obtaining the influenza vaccine at least 4 weeks prior to travel and September. 6. Follow-up: Patient to return to clinic in 2 months or sooner if needed.  Sonia Baller Ashok Cordia, M.D. [facility] Pager:  [phone] After 3pm or if no response, call [phone] 10:19 AM 09/25/15

## 2015-09-25 NOTE — Patient Instructions (Addendum)
   Continue taking your Flovent inhaler as prescribed. You can use 2 inhalations twice daily if you notice more coughing, wheezing, or breathing problems.  We are decreasing your Flonase nasal spray to 1 spray in each nostril twice daily. Call me if you notice any new symptoms.  I will see you back in 3 months. Call me if you need to be seen sooner.   TESTS ORDERED: 1. Spirometry & DLCO at next appointment 2. 6MWT at next appointment 3. Venous Duplex Left Lower Extremity

## 2015-09-28 ENCOUNTER — Ambulatory Visit (HOSPITAL_COMMUNITY)
Admission: RE | Admit: 2015-09-28 | Discharge: 2015-09-28 | Disposition: A | Discharge status: Home / Self Care | Payer: Medicare Other | Source: Ambulatory Visit | Attending: Cardiology | Admitting: Cardiology

## 2015-09-28 DIAGNOSIS — Z720 Tobacco use: Secondary | ICD-10-CM | POA: Diagnosis not present

## 2015-09-28 DIAGNOSIS — I10 Essential (primary) hypertension: Secondary | ICD-10-CM | POA: Diagnosis not present

## 2015-09-28 DIAGNOSIS — R600 Localized edema: Secondary | ICD-10-CM

## 2015-09-28 DIAGNOSIS — F419 Anxiety disorder, unspecified: Secondary | ICD-10-CM | POA: Diagnosis not present

## 2015-10-04 ENCOUNTER — Other Ambulatory Visit: Payer: Self-pay | Admitting: Cardiovascular Disease

## 2015-10-10 LAB — HM DEXA SCAN

## 2015-11-08 ENCOUNTER — Inpatient Hospital Stay (HOSPITAL_COMMUNITY): Admission: RE | Admit: 2015-11-08 | Payer: Medicare Other | Source: Ambulatory Visit | Admitting: Nurse Practitioner

## 2015-11-09 ENCOUNTER — Other Ambulatory Visit: Payer: Self-pay | Admitting: Pulmonary Disease

## 2015-11-13 ENCOUNTER — Other Ambulatory Visit (HOSPITAL_COMMUNITY): Payer: Self-pay | Admitting: *Deleted

## 2015-11-13 ENCOUNTER — Ambulatory Visit (HOSPITAL_COMMUNITY)
Admission: RE | Admit: 2015-11-13 | Discharge: 2015-11-13 | Disposition: A | Discharge status: Home / Self Care | Payer: Medicare Other | Source: Ambulatory Visit | Attending: Nurse Practitioner | Admitting: Nurse Practitioner

## 2015-11-13 ENCOUNTER — Encounter (HOSPITAL_COMMUNITY): Payer: Self-pay | Admitting: Nurse Practitioner

## 2015-11-13 VITALS — BP 138/76 | HR 73 | Ht 64.5 in | Wt 150.6 lb

## 2015-11-13 DIAGNOSIS — I4892 Unspecified atrial flutter: Secondary | ICD-10-CM | POA: Insufficient documentation

## 2015-11-13 DIAGNOSIS — Z87891 Personal history of nicotine dependence: Secondary | ICD-10-CM | POA: Diagnosis not present

## 2015-11-13 DIAGNOSIS — K635 Polyp of colon: Secondary | ICD-10-CM | POA: Insufficient documentation

## 2015-11-13 DIAGNOSIS — M1990 Unspecified osteoarthritis, unspecified site: Secondary | ICD-10-CM | POA: Insufficient documentation

## 2015-11-13 DIAGNOSIS — J449 Chronic obstructive pulmonary disease, unspecified: Secondary | ICD-10-CM | POA: Insufficient documentation

## 2015-11-13 DIAGNOSIS — I4819 Other persistent atrial fibrillation: Secondary | ICD-10-CM

## 2015-11-13 DIAGNOSIS — M8580 Other specified disorders of bone density and structure, unspecified site: Secondary | ICD-10-CM | POA: Diagnosis not present

## 2015-11-13 DIAGNOSIS — I480 Paroxysmal atrial fibrillation: Secondary | ICD-10-CM | POA: Diagnosis not present

## 2015-11-13 DIAGNOSIS — I10 Essential (primary) hypertension: Secondary | ICD-10-CM | POA: Diagnosis not present

## 2015-11-13 DIAGNOSIS — Z7901 Long term (current) use of anticoagulants: Secondary | ICD-10-CM | POA: Diagnosis not present

## 2015-11-13 LAB — BASIC METABOLIC PANEL
Anion gap: 10 (ref 5–15)
BUN: 13 mg/dL (ref 6–20)
CO2: 26 mmol/L (ref 22–32)
Calcium: 9.3 mg/dL (ref 8.9–10.3)
Chloride: 104 mmol/L (ref 101–111)
Creatinine, Ser: 0.82 mg/dL (ref 0.44–1.00)
GFR calc Af Amer: >60 mL/min (ref >60)
GFR calc non Af Amer: >60 mL/min (ref >60)
Glucose, Bld: 76 mg/dL (ref 65–99)
Potassium: 3.8 mmol/L (ref 3.5–5.1)
Sodium: 140 mmol/L (ref 135–145)

## 2015-11-13 LAB — MAGNESIUM: Magnesium: 2.1 mg/dL (ref 1.7–2.4)

## 2015-11-13 MED ORDER — DILTIAZEM HCL 30 MG PO TABS: ORAL_TABLET | ORAL | 2 refills | Status: DC

## 2015-11-13 MED ORDER — POTASSIUM CHLORIDE ER 10 MEQ PO TBCR: 10.0000 meq | EXTENDED_RELEASE_TABLET | Freq: Every day | ORAL | 6 refills | Status: DC

## 2015-11-13 NOTE — Patient Instructions (Signed)
Your physician has recommended you make the following change in your medication:   1)Cardizem 30mg -- take 1 tablet every 4 hours AS NEEDED for heart rate >100 as long as blood pressure >100.   

## 2015-11-13 NOTE — Progress Notes (Signed)
Patient ID: Elizabeth Mcconnell, female   DOB: 1945/04/13, 70 y.o.   MRN: [id number]     Primary Care Physician: Elizabeth Calico, MD Referring Physician: Hansel Mcconnell   Elizabeth Mcconnell is a 70 y.o. female with a h/o PAF on tikosyn for f/u in the afib clinic 9/11. She continues on tikosyn with  very little afib burden, however she did have a fib x 48 hours last week. She is in SR today. She does drink 1-2 glasses of wine a night. Discussed that wine may be contributing to afib burden.  Continues on eliquis for chadsvasc score of at least 3.  She has ongoing left arm discomfort that usually starts after going to bed at night with a tight feeling of left upper arm and tingling/numbness of left hand. She had a stress test on last visit in June after she c/o of this and it was low risk. This does not occur with exertional activities. She will be traveling to [place] first two weeks of June and we discussed how to mange afib should it occur.   Today, she denies symptoms of palpitations, chest pain, shortness of breath, orthopnea, PND, lower extremity edema, dizziness, presyncope, syncope, or neurologic sequela. The patient is tolerating medications without difficulties and is otherwise without complaint today.   Past Medical History:  Diagnosis Date  . Adenomatous polyp of colon 10/2013   f/u colo 10/2018  . Allergic rhinitis   . ALLERGIC RHINITIS   . ANXIETY    "situational" (02/14/2015)  . Arthritis    "neck, back, hands" (02/14/2015)  . Asthma   . Atrial fibrillation with RVR ([place])   . Atrial flutter ([place])    failed cardioversion 11/2012; s/p ablation 02-02-2013 by Dr Rayann Heman  . Chronic bronchitis ([place])    "I'll have it most years" (02/14/2015)  . Diverticulosis   . Endometriosis   . Essential hypertension 05/30/2014  . Oral candidiasis   . Osteopenia 08/2015   T score -1.7 FRAX 14%/4.4% overall stable from prior DEXA.  [name] Pneumothorax on left 1996   left lower lung collapse s/p resection  .  Tobacco abuse    Past Surgical History:  Procedure Laterality Date  . ABDOMINAL HYSTERECTOMY    . APPENDECTOMY  1978  . ATRIAL FLUTTER ABLATION N/A 02/02/2013   Procedure: ATRIAL FLUTTER ABLATION;  Surgeon: Elizabeth Mark, MD;  Location: [facility];  Service: Cardiovascular;  Laterality: N/A;  . CARDIOVERSION N/A 11/27/2012   Procedure: CARDIOVERSION;  Surgeon: Elizabeth Headings, MD;  Location: [facility];  Service: Cardiovascular;  Laterality: N/A;  . [place]; 1978  . LAPAROSCOPIC PARTIAL COLECTOMY N/A 05/04/2015   Procedure: LAPAROSCOPIC LOW ANTERIOR RESECTION LAPAROSCOPIC LYSIS OF ADHESIONS, SPLENIC FLEXURE MOBILIZATION;  Surgeon: Elizabeth Boston, MD;  [facility];  Service: General;  Laterality: N/A;  . LUNG REMOVAL, PARTIAL Left 02/1965   LLL  . TOTAL ABDOMINAL HYSTERECTOMY W/ BILATERAL SALPINGOOPHORECTOMY  1984   Endometriosis    Current Outpatient Prescriptions  Medication Sig Dispense Refill  . acetaminophen (TYLENOL) 650 MG CR tablet Take 1,300 mg by mouth at bedtime. Second dose during day if needed    . ALPRAZolam (XANAX) 0.25 MG tablet Take 1 tablet (0.25 mg total) by mouth 2 (two) times daily as needed for anxiety. 60 tablet 2  . cyanocobalamin 2000 MCG tablet Take 1 tablet (2,000 mcg total) by mouth daily. 90 tablet 3  . diltiazem (CARDIZEM CD) 300 MG 24 hr capsule TAKE 1 CAPSULE BY MOUTH  NIGHTLY AT BEDTIME 30 capsule 11  . dofetilide (TIKOSYN) 250 MCG capsule Take 1 capsule (250 mcg total) by mouth 2 (two) times daily. 14 capsule 0  . ELIQUIS 5 MG TABS tablet TAKE 1 TABLET BY MOUTH 2 TIMES DAILY 180 tablet 2  . estradiol (VIVELLE-DOT) 0.05 MG/24HR patch Place 1 patch (0.05 mg total) onto the skin 2 (two) times a week. 8 patch 12  . fluticasone (FLONASE) 50 MCG/ACT nasal spray PLACE 2 SPRAYS IN EACH NOSTRIL 2 TIMES DAILY (Patient taking differently: PLACE 1 SPRAY IN EACH NOSTRIL 2 TIMES DAILY) 16 g 5  . fluticasone (FLOVENT HFA) 110 MCG/ACT inhaler Inhale 1  puff into the lungs 2 (two) times daily. 1 Inhaler 12  . magnesium oxide (MAG-OX) 400 MG tablet Take 1 tablet (400 mg total) by mouth daily. 30 tablet 6  . Melatonin 3 MG TABS Take 1 tablet by mouth daily as needed. For sleep    . montelukast (SINGULAIR) 10 MG tablet TAKE 1 TABLET BY MOUTH EVERY DAY 30 tablet 2  . XOPENEX HFA 45 MCG/ACT inhaler USE ONE TO TWO PUFFS EVERY 4 HOURS AS NEEDED (Patient taking differently: USE ONE TO TWO PUFFS EVERY 4 HOURS AS NEEDED SOB) 15 g 3  . diltiazem (CARDIZEM) 30 MG tablet Cardizem 30mg  -- take 1 tablet by mouth every 4 hours AS NEEDED for heart rate >100 as long as blood pressure >100. 45 tablet 2  . doxycycline (VIBRA-TABS) 100 MG tablet Take 1 tablet (100 mg total) by mouth 2 (two) times daily. (Patient not taking: Reported on 11/13/2015) 14 tablet 0   No current facility-administered medications for this encounter.     Allergies  Allergen Reactions  . Tramadol Other (See Comments)    Severe dizziness, weakness  . Amoxicillin-Pot Clavulanate Nausea And Vomiting  . Flagyl [Metronidazole] Nausea And Vomiting  . Flecainide     dizziness  . Omnicef [Cefdinir] Nausea And Vomiting  . Avelox [Moxifloxacin] Hives, Itching and Rash    Social History   Social History  . Marital status: Married    Spouse name: N/A  . Number of children: 2  . Years of education: N/A   Occupational History  . 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  [employer]   Social History Main Topics  . Smoking status: Former Smoker    Packs/day: 0.25    Years: 43.00    Types: Cigarettes  . Smokeless tobacco: Never Used  . Alcohol use 8.4 oz/week    14 Standard drinks or equivalent per week     Comment: daily wine  . Drug use: No  . Sexual activity: Yes    Birth control/ protection: Surgical, Post-menopausal     Comment: HYST-1st intercourse 70 yo-Fewer than 5 partners   Other Topics Concern  . Not on file   Social History Narrative   Lives with spouse;    2  children      [facility]:   She is from [facility]. 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 Previously has attended business school. Previously did office work and Programmer, applications at [employer]. She has also worked for [employer]. She does have a cat currently. She has indoor plants. No mold exposure.    Family History  Problem Relation Age of Onset  . Stroke Father 76  . Hypertension Father   . Heart disease Father   . Diabetes Mother   . Colon cancer Neg Hx   .  Rectal cancer Neg Hx   . Stomach cancer Neg Hx   . Asthma Maternal Grandfather   . Asthma Child     ROS- All systems are reviewed and negative except as per the HPI above  Physical Exam: Vitals:   11/13/15 1012  BP: 138/76  Pulse: 73  Weight: 150 lb 9.6 oz (68.3 kg)  Height: 5' 4.5" (1.638 m)    GEN- The patient is well appearing, alert and oriented x 3 today.   Head- normocephalic, atraumatic Eyes-  Sclera clear, conjunctiva pink Ears- hearing intact Oropharynx- clear Neck- supple, no JVP Lymph- no cervical lymphadenopathy Lungs- Clear to ausculation bilaterally, normal work of breathing Heart- Regular rate and rhythm, no murmurs, rubs or gallops, PMI not laterally displaced GI- soft, NT, ND, + BS Extremities- no clubbing, cyanosis, or edema MS- no significant deformity or atrophy Skin- no rash or lesion Psych- euthymic mood, full affect Neuro- strength and sensation are intact  EKG-NSR at 73 bpm, pr int 192 ms, qrs int 96 ms, qtc 473 ms(stable) Epic records reviewed Stress test-Study Highlights    Nuclear stress EF: 81%.  There was no ST segment deviation noted during stress.  The study is normal.  This is a low risk study.  The left ventricular ejection fraction is hyperdynamic (>65%).   This is a normal pharmacologic nuclear stress test with no evidence for prior infarct or ischemia.       Assessment and Plan: 1. PAF Continue eliquis 5 mg bid Continue  tikosyn 250 mg bid Continue cardizem daily and will rx 30 mg "pill in pocket" cardizem, if she should have breakthrough afib on trip. If HR over 100, BP sys over 100. Decrease wine intake to diminish afib burden Bmet/mag today  2. Left  arm discomfort when sleeping Lexi myoview low risk in June Does not sound ischemic Sounds like radicular pain to me and she will mention this to her PCP on f/u 9/26   F/u in afib clinic in 3 months    [place]. Kourtnee Lahey, [facility] [address] [place], [place] [address] [phone]

## 2015-11-22 ENCOUNTER — Ambulatory Visit: Payer: Medicare Other | Admitting: Internal Medicine

## 2015-11-23 ENCOUNTER — Other Ambulatory Visit: Payer: Self-pay | Admitting: Nurse Practitioner

## 2015-11-28 ENCOUNTER — Encounter: Payer: Self-pay | Admitting: Internal Medicine

## 2015-11-28 ENCOUNTER — Other Ambulatory Visit (INDEPENDENT_AMBULATORY_CARE_PROVIDER_SITE_OTHER): Payer: Medicare Other

## 2015-11-28 ENCOUNTER — Ambulatory Visit (INDEPENDENT_AMBULATORY_CARE_PROVIDER_SITE_OTHER): Payer: Medicare Other | Admitting: Internal Medicine

## 2015-11-28 VITALS — BP 122/80 | HR 71 | Temp 98.0°F | Resp 16 | Ht 64.5 in | Wt 150.8 lb

## 2015-11-28 DIAGNOSIS — M159 Polyosteoarthritis, unspecified: Secondary | ICD-10-CM

## 2015-11-28 DIAGNOSIS — M150 Primary generalized (osteo)arthritis: Secondary | ICD-10-CM | POA: Diagnosis not present

## 2015-11-28 DIAGNOSIS — I4819 Other persistent atrial fibrillation: Secondary | ICD-10-CM

## 2015-11-28 DIAGNOSIS — I481 Persistent atrial fibrillation: Secondary | ICD-10-CM

## 2015-11-28 DIAGNOSIS — J4540 Moderate persistent asthma, uncomplicated: Secondary | ICD-10-CM | POA: Diagnosis not present

## 2015-11-28 DIAGNOSIS — M8949 Other hypertrophic osteoarthropathy, multiple sites: Secondary | ICD-10-CM

## 2015-11-28 DIAGNOSIS — I10 Essential (primary) hypertension: Secondary | ICD-10-CM

## 2015-11-28 LAB — MAGNESIUM: Magnesium: 2.1 mg/dL (ref 1.5–2.5)

## 2015-11-28 MED ORDER — TAPENTADOL HCL ER 50 MG PO TB12: 50.0000 mg | ORAL_TABLET | Freq: Two times a day (BID) | ORAL | 0 refills | Status: DC

## 2015-11-28 MED ORDER — FLUTICASONE PROPIONATE HFA 110 MCG/ACT IN AERO: 2.0000 | INHALATION_SPRAY | Freq: Two times a day (BID) | RESPIRATORY_TRACT | 12 refills | Status: DC

## 2015-11-28 NOTE — Patient Instructions (Signed)

## 2015-11-28 NOTE — Progress Notes (Signed)
Subjective:  Patient ID: Elizabeth Mcconnell, female    DOB: Jul 19, 1945  Age: 70 y.o. MRN: [id number]  CC: Asthma and Osteoarthritis   HPI Elizabeth Mcconnell presents for follow-up. She complains of worsening pain mostly in her joints but sometimes in her muscles. She has tried Tylenol without much symptom relief. Most of the pain is located in her low back, large joints, and shoulders. She denies joint swelling or decreased range of motion. She otherwise feels well and offers no other complaints.  Outpatient Medications Prior to Visit  Medication Sig Dispense Refill  . acetaminophen (TYLENOL) 650 MG CR tablet Take 1,300 mg by mouth at bedtime. Second dose during day if needed    . ALPRAZolam (XANAX) 0.25 MG tablet Take 1 tablet (0.25 mg total) by mouth 2 (two) times daily as needed for anxiety. 60 tablet 2  . cyanocobalamin 2000 MCG tablet Take 1 tablet (2,000 mcg total) by mouth daily. 90 tablet 3  . diltiazem (CARDIZEM CD) 300 MG 24 hr capsule TAKE 1 CAPSULE BY MOUTH NIGHTLY AT BEDTIME 30 capsule 11  . diltiazem (CARDIZEM) 30 MG tablet Cardizem 30mg  -- take 1 tablet by mouth every 4 hours AS NEEDED for heart rate >100 as long as blood pressure >100. 45 tablet 2  . dofetilide (TIKOSYN) 250 MCG capsule Take 1 capsule (250 mcg total) by mouth 2 (two) times daily. 14 capsule 0  . ELIQUIS 5 MG TABS tablet TAKE 1 TABLET BY MOUTH 2 TIMES DAILY 180 tablet 2  . estradiol (VIVELLE-DOT) 0.05 MG/24HR patch Place 1 patch (0.05 mg total) onto the skin 2 (two) times a week. 8 patch 12  . fluticasone (FLONASE) 50 MCG/ACT nasal spray PLACE 2 SPRAYS IN EACH NOSTRIL 2 TIMES DAILY (Patient taking differently: PLACE 1 SPRAY IN EACH NOSTRIL 2 TIMES DAILY) 16 g 5  . magnesium oxide (MAG-OX) 400 MG tablet Take 1 tablet (400 mg total) by mouth daily. 30 tablet 6  . Melatonin 3 MG TABS Take 1 tablet by mouth daily as needed. For sleep    . montelukast (SINGULAIR) 10 MG tablet TAKE 1 TABLET BY MOUTH EVERY DAY 30  tablet 2  . potassium chloride (K-DUR) 10 MEQ tablet Take 1 tablet (10 mEq total) by mouth daily. 30 tablet 6  . XOPENEX HFA 45 MCG/ACT inhaler USE ONE TO TWO PUFFS EVERY 4 HOURS AS NEEDED (Patient taking differently: USE ONE TO TWO PUFFS EVERY 4 HOURS AS NEEDED SOB) 15 g 3  . dofetilide (TIKOSYN) 250 MCG capsule TAKE 1 CAPSULE BY MOUTH 2 TIMES DAILY 180 capsule 3  . fluticasone (FLOVENT HFA) 110 MCG/ACT inhaler Inhale 1 puff into the lungs 2 (two) times daily. 1 Inhaler 12  . doxycycline (VIBRA-TABS) 100 MG tablet Take 1 tablet (100 mg total) by mouth 2 (two) times daily. (Patient not taking: Reported on 11/28/2015) 14 tablet 0   No facility-administered medications prior to visit.     ROS Review of Systems  Constitutional: Negative.  Negative for activity change, diaphoresis, fatigue and unexpected weight change.  HENT: Negative.  Negative for sore throat, trouble swallowing and voice change.   Eyes: Negative.   Respiratory: Negative.  Negative for cough, choking, chest tightness, shortness of breath and stridor.   Cardiovascular: Negative.  Negative for chest pain, palpitations and leg swelling.  Gastrointestinal: Negative.  Negative for abdominal pain, constipation, diarrhea, nausea and vomiting.  Endocrine: Negative.   Genitourinary: Negative.   Musculoskeletal: Positive for arthralgias and back pain. Negative for  myalgias and neck pain.  Skin: Negative.  Negative for color change and rash.  Allergic/Immunologic: Negative.   Neurological: Negative.  Negative for dizziness, weakness and numbness.  Hematological: Negative.   Psychiatric/Behavioral: Negative.     Objective:  BP 122/80 (BP Location: Left Arm, Patient Position: Sitting, Cuff Size: Normal)   Pulse 71   Temp 98 F (36.7 C) (Oral)   Resp 16   Ht 5' 4.5" (1.638 m)   Wt 150 lb 12 oz (68.4 kg)   SpO2 97%   BMI 25.48 kg/m   BP Readings from Last 3 Encounters:  11/28/15 122/80  11/13/15 138/76  09/25/15 118/72     Wt Readings from Last 3 Encounters:  11/28/15 150 lb 12 oz (68.4 kg)  11/13/15 150 lb 9.6 oz (68.3 kg)  09/25/15 150 lb (68 kg)    Physical Exam  Constitutional: She is oriented to person, place, and time. No distress.  HENT:  Mouth/Throat: Oropharynx is clear and moist. No oropharyngeal exudate.  Eyes: Conjunctivae are normal. Right eye exhibits no discharge. Left eye exhibits no discharge. No scleral icterus.  Neck: Normal range of motion. Neck supple. No JVD present. No tracheal deviation present. No thyromegaly present.  Cardiovascular: Normal rate, regular rhythm, normal heart sounds and intact distal pulses.  Exam reveals no gallop and no friction rub.   No murmur heard. Pulmonary/Chest: Effort normal and breath sounds normal. No stridor. No respiratory distress. She has no wheezes. She has no rales. She exhibits no tenderness.  Abdominal: Soft. Bowel sounds are normal. She exhibits no distension and no mass. There is no tenderness. There is no rebound and no guarding.  Musculoskeletal: Normal range of motion. She exhibits no edema, tenderness or deformity.  Lymphadenopathy:    She has no cervical adenopathy.  Neurological: She is oriented to person, place, and time.  Skin: Skin is warm and dry. No rash noted. She is not diaphoretic. No erythema. No pallor.  Vitals reviewed.   Lab Results  Component Value Date   WBC 8.4 07/19/2015   HGB 13.2 07/19/2015   HCT 39.9 07/19/2015   PLT 196.0 07/19/2015   GLUCOSE 76 11/13/2015   CHOL 261 (H) 05/30/2015   TRIG 192.0 (H) 05/30/2015   HDL 65.70 05/30/2015   LDLCALC 157 (H) 05/30/2015   ALT 9 07/19/2015   AST 12 07/19/2015   NA 140 11/13/2015   K 3.8 11/13/2015   CL 104 11/13/2015   CREATININE 0.82 11/13/2015   BUN 13 11/13/2015   CO2 26 11/13/2015   TSH 1.32 07/19/2015   INR 1.30 05/01/2015   HGBA1C 5.5 05/03/2015    No results found.  Assessment & Plan:   Elizabeth Mcconnell was seen today for asthma and  osteoarthritis.  Diagnoses and all orders for this visit:  Asthma, moderate persistent, uncomplicated- her symptoms are well controlled on the ICS -     fluticasone (FLOVENT HFA) 110 MCG/ACT inhaler; Inhale 2 puffs into the lungs 2 (two) times daily.  Persistent atrial fibrillation ([place])- she has good rate and rhythm control -     Basic Metabolic Panel (BMET) -     Magnesium; Future  Primary osteoarthritis involving multiple joints- will try nucynta for relief of the pain -     tapentadol (NUCYNTA ER) 50 MG 12 hr tablet; Take 1 tablet (50 mg total) by mouth every 12 (twelve) hours.  Essential hypertension- her BP is well controlled -     Magnesium; Future   I have discontinued Ms.  Mcconnell's doxycycline. I have also changed her fluticasone. Additionally, I am having her start on tapentadol. Lastly, I am having her maintain her Melatonin, acetaminophen, magnesium oxide, dofetilide, XOPENEX HFA, diltiazem, cyanocobalamin, fluticasone, ALPRAZolam, estradiol, montelukast, ELIQUIS, diltiazem, potassium chloride, and LOTEMAX.  Meds ordered this encounter  Medications  . LOTEMAX 0.5 % GEL  . tapentadol (NUCYNTA ER) 50 MG 12 hr tablet    Sig: Take 1 tablet (50 mg total) by mouth every 12 (twelve) hours.    Dispense:  60 tablet    Refill:  0  . fluticasone (FLOVENT HFA) 110 MCG/ACT inhaler    Sig: Inhale 2 puffs into the lungs 2 (two) times daily.    Dispense:  1 Inhaler    Refill:  12     Follow-up: Return in about 6 months (around 05/27/2016).  [name], MD

## 2015-11-28 NOTE — Progress Notes (Signed)
Pre visit review using our clinic review tool, if applicable. No additional management support is needed unless otherwise documented below in the visit note. 

## 2015-12-13 ENCOUNTER — Other Ambulatory Visit: Payer: Self-pay | Admitting: Pulmonary Disease

## 2015-12-19 ENCOUNTER — Ambulatory Visit: Payer: Medicare Other | Admitting: Pulmonary Disease

## 2015-12-19 ENCOUNTER — Ambulatory Visit: Payer: Medicare Other

## 2016-01-01 ENCOUNTER — Other Ambulatory Visit: Payer: Self-pay | Admitting: Adult Health

## 2016-01-22 ENCOUNTER — Telehealth: Payer: Self-pay | Admitting: Cardiovascular Disease

## 2016-01-22 NOTE — Telephone Encounter (Signed)
I spoke with the pt's husband since the pt is currently not at home.  I made him aware that with the pt's current diagnosis of Atrial Fibrillation she is being managed by the appropriate team.  I did not feel that she needs to arrange a follow-up appointment with Dr Burt Knack.  I will send this message to Dr Burt Knack for advisement.

## 2016-01-22 NOTE — Telephone Encounter (Signed)
Pt was seen by Dr Burt Knack 02/2015 for Atrial Fibrillation. Dr Burt Knack referred the pt back to Dr Rayann Heman for further management of Atrial Fibrillation.  The pt has been followed by the AFib clinic on a regular basis.

## 2016-01-22 NOTE — Telephone Encounter (Signed)
New message  Pt wants to know if she is to come back for f/u since it has been a year since she has seen Dr. Burt Knack  No recall in [place]

## 2016-02-12 ENCOUNTER — Encounter (HOSPITAL_COMMUNITY): Payer: Self-pay | Admitting: Nurse Practitioner

## 2016-02-12 ENCOUNTER — Ambulatory Visit (HOSPITAL_COMMUNITY)
Admission: RE | Admit: 2016-02-12 | Discharge: 2016-02-12 | Disposition: A | Discharge status: Home / Self Care | Payer: Medicare Other | Source: Ambulatory Visit | Attending: Nurse Practitioner | Admitting: Nurse Practitioner

## 2016-02-12 VITALS — BP 136/84 | HR 82 | Ht 64.5 in | Wt 155.4 lb

## 2016-02-12 DIAGNOSIS — I10 Essential (primary) hypertension: Secondary | ICD-10-CM | POA: Diagnosis not present

## 2016-02-12 DIAGNOSIS — I480 Paroxysmal atrial fibrillation: Secondary | ICD-10-CM | POA: Insufficient documentation

## 2016-02-12 LAB — BASIC METABOLIC PANEL
Anion gap: 11 (ref 5–15)
BUN: 13 mg/dL (ref 6–20)
CO2: 26 mmol/L (ref 22–32)
Calcium: 9.7 mg/dL (ref 8.9–10.3)
Chloride: 101 mmol/L (ref 101–111)
Creatinine, Ser: 0.83 mg/dL (ref 0.44–1.00)
GFR calc Af Amer: >60 mL/min (ref >60)
GFR calc non Af Amer: >60 mL/min (ref >60)
Glucose, Bld: 78 mg/dL (ref 65–99)
Potassium: 4.1 mmol/L (ref 3.5–5.1)
Sodium: 138 mmol/L (ref 135–145)

## 2016-02-12 LAB — MAGNESIUM: Magnesium: 2.2 mg/dL (ref 1.7–2.4)

## 2016-02-12 NOTE — Progress Notes (Signed)
Patient ID: Elizabeth Mcconnell, female   DOB: 07-26-1945, 70 y.o.   MRN: [id number]     Primary Care Physician: [name], MD Referring Physician: Hansel Starling   Elizabeth Mcconnell is a 70 y.o. female with a h/o a flutter abaltion 2014, PAF on tikosyn for f/u in the afib clinic 11/13/14. She continues on tikosyn with  very little afib burden, however she did have a fib x 48 hours last week. She is in SR today. She does drink 1-2 glasses of wine a night. Discussed that wine may be contributing to afib burden.  Continues on eliquis for chadsvasc score of at least 3.  She has ongoing left arm discomfort that usually starts after going to bed at night with a tight feeling of left upper arm and tingling/numbness of left hand. She had a stress test on last visit in June after she c/o of this and it was low risk. This does not occur with exertional activities. She will be traveling to [place] first two weeks of June and we discussed how to mange afib should it occur.   F/u in afib clinic 12/11, and is in atrial flutter. She is aware. She has been going in and out of afib/flutter.  Usually can take 30 mg Cardizem as needed and will get back in rhythm in 4 hours.She blames increased afib  on a lot of anxiety re family issues and daily arthritic pain. She takes Tylenol but does still have pain. Continues on eliquis 5 mg bid. Her left arm discomfort has resolved. She stumbled in [place] in June and bumped her upper rt arm on the stair rail. She has some discomfort in the muscle of the arm since then.  Today, she denies symptoms of palpitations, chest pain, shortness of breath, orthopnea, PND, lower extremity edema, dizziness, presyncope, syncope, or neurologic sequela. Weepy in the office today dealing with stress and chroinc pain The patient is tolerating medications without difficulties and is otherwise without complaint today.   Past Medical History:  Diagnosis Date  . Adenomatous polyp of colon 10/2013   f/u colo 10/2018  . Allergic rhinitis   . ALLERGIC RHINITIS   . ANXIETY    "situational" (02/14/2015)  . Arthritis    "neck, back, hands" (02/14/2015)  . Asthma   . Atrial fibrillation with RVR ([place])   . Atrial flutter ([place])    failed cardioversion 11/2012; s/p ablation 02-02-2013 by Dr Rayann Heman  . Chronic bronchitis ([place])    "I'll have it most years" (02/14/2015)  . Diverticulosis   . Endometriosis   . Essential hypertension 05/30/2014  . Oral candidiasis   . Osteopenia 08/2015   T score -1.7 FRAX 14%/4.4% overall stable from prior DEXA.  [name] Pneumothorax on left 1996   left lower lung collapse s/p resection  . Tobacco abuse    Past Surgical History:  Procedure Laterality Date  . ABDOMINAL HYSTERECTOMY    . APPENDECTOMY  1978  . ATRIAL FLUTTER ABLATION N/A 02/02/2013   Procedure: ATRIAL FLUTTER ABLATION;  Surgeon: Coralyn Mark, MD;  Location: [facility];  Service: Cardiovascular;  Laterality: N/A;  . CARDIOVERSION N/A 11/27/2012   Procedure: CARDIOVERSION;  Surgeon: Thayer Headings, MD;  Location: [facility];  Service: Cardiovascular;  Laterality: N/A;  . [place]; 1978  . LAPAROSCOPIC PARTIAL COLECTOMY N/A 05/04/2015   Procedure: LAPAROSCOPIC LOW ANTERIOR RESECTION LAPAROSCOPIC LYSIS OF ADHESIONS, SPLENIC FLEXURE MOBILIZATION;  Surgeon: Michael Boston, MD;  Location: [facility];  Service: General;  Laterality: N/A;  . LUNG REMOVAL, PARTIAL Left 02/1965   LLL  . TOTAL ABDOMINAL HYSTERECTOMY W/ BILATERAL SALPINGOOPHORECTOMY  1984   Endometriosis    Current Outpatient Prescriptions  Medication Sig Dispense Refill  . acetaminophen (TYLENOL) 650 MG CR tablet Take 1,300 mg by mouth at bedtime. Second dose during day if needed    . ALPRAZolam (XANAX) 0.25 MG tablet Take 1 tablet (0.25 mg total) by mouth 2 (two) times daily as needed for anxiety. 60 tablet 2  . cyanocobalamin 2000 MCG tablet Take 1 tablet (2,000 mcg total) by mouth daily. 90 tablet 3  . diltiazem (CARDIZEM CD)  300 MG 24 hr capsule TAKE 1 CAPSULE BY MOUTH NIGHTLY AT BEDTIME 30 capsule 11  . diltiazem (CARDIZEM) 30 MG tablet Cardizem 30mg  -- take 1 tablet by mouth every 4 hours AS NEEDED for heart rate >100 as long as blood pressure >100. 45 tablet 2  . dofetilide (TIKOSYN) 250 MCG capsule Take 1 capsule (250 mcg total) by mouth 2 (two) times daily. 14 capsule 0  . ELIQUIS 5 MG TABS tablet TAKE 1 TABLET BY MOUTH 2 TIMES DAILY 180 tablet 2  . estradiol (VIVELLE-DOT) 0.05 MG/24HR patch Place 1 patch (0.05 mg total) onto the skin 2 (two) times a week. 8 patch 12  . fluticasone (FLONASE) 50 MCG/ACT nasal spray PLACE 2 SPRAYS IN EACH NOSTRIL 2 TIMES DAILY 16 g 1  . fluticasone (FLOVENT HFA) 110 MCG/ACT inhaler Inhale 2 puffs into the lungs 2 (two) times daily. 1 Inhaler 12  . magnesium oxide (MAG-OX) 400 MG tablet Take 1 tablet (400 mg total) by mouth daily. 30 tablet 6  . Melatonin 3 MG TABS Take 1 tablet by mouth daily as needed. For sleep    . montelukast (SINGULAIR) 10 MG tablet TAKE 1 TABLET BY MOUTH EVERY DAY 30 tablet 2  . XOPENEX HFA 45 MCG/ACT inhaler USE ONE TO TWO PUFFS EVERY 4 HOURS AS NEEDED (Patient taking differently: USE ONE TO TWO PUFFS EVERY 4 HOURS AS NEEDED SOB) 15 g 3   No current facility-administered medications for this encounter.     Allergies  Allergen Reactions  . Tramadol Other (See Comments)    Severe dizziness, weakness  . Amoxicillin-Pot Clavulanate Nausea And Vomiting  . Flagyl [Metronidazole] Nausea And Vomiting  . Flecainide     dizziness  . Omnicef [Cefdinir] Nausea And Vomiting  . Avelox [Moxifloxacin] Hives, Itching and Rash    Social History   Social History  . Marital status: Married    Spouse name: N/A  . Number of children: 2  . Years of education: N/A   Occupational History  . 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  [place]   Social History Main Topics  . Smoking status: Former Smoker    Packs/day: 0.25    Years: 43.00    Types:  Cigarettes  . Smokeless tobacco: Never Used  . Alcohol use 8.4 oz/week    14 Standard drinks or equivalent per week     Comment: daily wine  . Drug use: No  . Sexual activity: Yes    Birth control/ protection: Surgical, Post-menopausal     Comment: HYST-1st intercourse 70 yo-Fewer than 5 partners   Other Topics Concern  . Not on file   Social History Narrative   Lives with spouse;    2 children      [facility]:   She is from [facility]. 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 Previously has attended business school. Previously did office work and Programmer, applications at [employer]. She has also worked for [employer]. She does have a cat currently. She has indoor plants. No mold exposure.    Family History  Problem Relation Age of Onset  . Stroke Father 1  . Hypertension Father   . Heart disease Father   . Diabetes Mother   . Colon cancer Neg Hx   . Rectal cancer Neg Hx   . Stomach cancer Neg Hx   . Asthma Maternal Grandfather   . Asthma Child     ROS- All systems are reviewed and negative except as per the HPI above  Physical Exam: Vitals:   02/12/16 1006  BP: 136/84  Pulse: 82  Weight: 155 lb 6.4 oz (70.5 kg)  Height: 5' 4.5" (1.638 m)    GEN- The patient is well appearing, alert and oriented x 3 today.   Head- normocephalic, atraumatic Eyes-  Sclera clear, conjunctiva pink Ears- hearing intact Oropharynx- clear Neck- supple, no JVP Lymph- no cervical lymphadenopathy Lungs- Clear to ausculation bilaterally, normal work of breathing Heart- Regular rate and rhythm, no murmurs, rubs or gallops, PMI not laterally displaced GI- soft, NT, ND, + BS Extremities- no clubbing, cyanosis, or edema MS- no significant deformity or atrophy Skin- no rash or lesion Psych- euthymic mood, full affect Neuro- strength and sensation are intact  EKG-NSR at 82 bpm, qrs int 92 ms, qtc 432 ms Epic records reviewed Stress test-Study Highlights     Nuclear stress EF: 81%.  There was no ST segment deviation noted during stress.  The study is normal.  This is a low risk study.  The left ventricular ejection fraction is hyperdynamic (>65%).   This is a normal pharmacologic nuclear stress test with no evidence for prior infarct or ischemia.       Assessment and Plan:  1. PAF Continue eliquis 5 mg bid Continue tikosyn 250 mg bid Continue cardizem daily and continue to use 30 mg "pill in pocket" cardizem, if she should have breakthrough afib, so far is working well for pt. Advised to go home and take one this am Decrease wine intake to diminish afib burden Bmet/mag today  2. Left  arm discomfort when sleeping Lexi myoview low risk in June resolved   F/u with Dr. Rayann Heman  in 3 months, if she has increased afib burden may want to discuss afib ablation    [name] C. Shavon Zenz, [facility] [address] [place], [place] [address] [phone]

## 2016-02-23 ENCOUNTER — Ambulatory Visit: Payer: Medicare Other

## 2016-02-23 ENCOUNTER — Ambulatory Visit: Payer: Medicare Other | Admitting: Pulmonary Disease

## 2016-03-01 ENCOUNTER — Other Ambulatory Visit: Payer: Self-pay | Admitting: Adult Health

## 2016-03-14 ENCOUNTER — Other Ambulatory Visit: Payer: Self-pay

## 2016-03-14 MED ORDER — MONTELUKAST SODIUM 10 MG PO TABS: 10.0000 mg | ORAL_TABLET | Freq: Every day | ORAL | 5 refills | Status: DC

## 2016-03-25 ENCOUNTER — Ambulatory Visit (INDEPENDENT_AMBULATORY_CARE_PROVIDER_SITE_OTHER)
Admission: RE | Admit: 2016-03-25 | Discharge: 2016-03-25 | Disposition: A | Discharge status: Home / Self Care | Payer: Medicare Other | Source: Ambulatory Visit | Attending: Internal Medicine | Admitting: Internal Medicine

## 2016-03-25 ENCOUNTER — Ambulatory Visit (INDEPENDENT_AMBULATORY_CARE_PROVIDER_SITE_OTHER): Payer: Medicare Other | Admitting: Internal Medicine

## 2016-03-25 ENCOUNTER — Telehealth: Payer: Self-pay | Admitting: Internal Medicine

## 2016-03-25 ENCOUNTER — Encounter: Payer: Self-pay | Admitting: Internal Medicine

## 2016-03-25 VITALS — BP 104/76 | HR 73 | Temp 97.7°F | Ht 65.0 in | Wt 156.8 lb

## 2016-03-25 DIAGNOSIS — J180 Bronchopneumonia, unspecified organism: Secondary | ICD-10-CM | POA: Diagnosis not present

## 2016-03-25 DIAGNOSIS — J4541 Moderate persistent asthma with (acute) exacerbation: Secondary | ICD-10-CM | POA: Diagnosis not present

## 2016-03-25 MED ORDER — PREDNISONE 10 MG PO TABS: ORAL_TABLET | ORAL | 0 refills | Status: DC

## 2016-03-25 MED ORDER — DOXYCYCLINE HYCLATE 100 MG PO TABS: 100.0000 mg | ORAL_TABLET | Freq: Two times a day (BID) | ORAL | 0 refills | Status: DC

## 2016-03-25 MED ORDER — HYDROCODONE-HOMATROPINE 5-1.5 MG/5ML PO SYRP: 5.0000 mL | ORAL_SOLUTION | Freq: Four times a day (QID) | ORAL | 0 refills | Status: DC | PRN

## 2016-03-25 NOTE — Telephone Encounter (Signed)
The last prescription that TP gave her was Hydromet 5cc every 6 hours #258mL. Rx has been printed and given to MW to sign.

## 2016-03-25 NOTE — Progress Notes (Signed)
Subjective:    Patient ID: Elizabeth Mcconnell, female    DOB: 1946-01-25    MRN: [id number]  C.C.:  Follow-up for Moderate, Persistent Asthma, Allergic Rhinitis, & Ongoing Tobacco Use.    Brief patient profile:  Dr Ammie Dalton note  09/25/15  Moderate, Persistent Asthma: Prescribed Flovent HFA & Singulair. Patient switched to Flovent by her PCP due to concerns with interaction with her atrial fibrillation. Patient with mild obstruction on spirometry. No exacerbations since last appointment. Reports mild intermittently. No nocturnal awakenings with any breathing problems. She is now using her Xopenex rescue inhaler every 2-3 days.   Allergic Rhinitis: On Flonase & Singulair. Denies any significant sinus congestion or drainage.   Ongoing Tobacco Use:  She is still smoking intermittently & sporadically. She reports she is able to stop for long periods, months, at a time. Her husband also smokes intermittently. Previously tried to quit using nicotine patches.  rec  Continue taking your Flovent inhaler as prescribed. You can use 2 inhalations twice daily if you notice more coughing, wheezing, or breathing problems.  We are decreasing your Flonase nasal spray to 1 spray in each nostril twice daily. Call me if you notice any new symptoms.  I will see you back in 3 months. Call me if you need to be seen sooner.   TESTS ORDERED: 1. Spirometry & DLCO at next appointment 2. 6MWT at next appointment 3. Venous Duplex Left Lower Extremity     03/25/2016 acute extended ov/[facility] re:  Chronic asthma maint flovent hfa ? Still smoking ?  Chief Complaint  Patient presents with  . Acute Visit    Pt c/o chest and sinus congestion for the past 2 wks. She feels very tired and states that she has chills "all the time"- no fever. She has had a prod cough with grey sputum.    baseline doing fine on  flovent hfa / singulair not needing any rescue with little flare of cough attributes to  Uri dec 2017 took doxy  and all better until 2 weeks prior to OV with cough thick grey mucus x 2 weeks worse in ams with lots of sinus congestion and grey mucus, some chills but no fever / sweats - even with flare not using xopenex and not sure how much she can "maybe 2 pffs a day"   No obvious day to day or daytime variability or assoc   mucus plugs or hemoptysis or cp or chest tightness, subjective wheeze or overt   hb symptoms. No unusual exp hx or h/o childhood pna/ asthma or knowledge of premature birth.  Sleeping ok without nocturnal  or early am exacerbation  of respiratory  c/o's or need for noct saba. Also denies any obvious fluctuation of symptoms with weather or environmental changes or other aggravating or alleviating factors except as outlined above   Current Medications, Allergies, Complete Past Medical History, Past Surgical History, Family History, and Social History were reviewed in [employer] record.  ROS  The following are not active complaints unless bolded sore throat, dysphagia, dental problems, itching, sneezing,  nasal congestion or excess/ purulent secretions, ear ache,   fever, chills, sweats, unintended wt loss, classically pleuritic or exertional cp,  orthopnea pnd or leg swelling, presyncope, palpitations, abdominal pain, anorexia, nausea, vomiting, diarrhea  or change in bowel or bladder habits, change in stools or urine, dysuria,hematuria,  rash, arthralgias, visual complaints, headache, numbness, weakness or ataxia or problems with walking or coordination,  change in mood/affect  or memory.                   Objective:   Physical Exam  amb anxious wf nad   Wt Readings from Last 3 Encounters:  03/25/16 156 lb 12.8 oz (71.1 kg)  02/12/16 155 lb 6.4 oz (70.5 kg)  11/28/15 150 lb 12 oz (68.4 kg)    Vital signs reviewed  - Note on arrival 02 sats  97% on RA     General:  Awake. No distress. Appears comfortable. Integument:  Warm & dry. No rash on exposed  skin. HEENT:  Moist mucus membranes. No oral ulcers. Mild bilateral nasal turbinate swelling/nonspecific s purulent d/c Cardiovascular:  Regular rate. No edema today. No appreciable JVD.  Pulmonary:  Junky insp and exp rhonchi bilaterally  Abdomen: Soft. Normal bowel sounds. Nontender.  PFT 09/25/15: FVC 2.85 L (95%) FEV1 1.58 L (70%) FEV1/FVC 0.55 FEF 25-75 0.61 L (33%) TLC 4.75 L (92%) RV 73% DLCO uncorrected 58%  IMAGING CXR PA/LAT 08/02/14 (per radiologist): No active disease. Hyperinflation. Fibrotic changes LUL.  CARDIAC EKG (08/16/15):  RBB & QTc 468ms.  TTE (08/05/12): LV with mild basal septal hypertrophy. EF 60-65%. Normal regional wall motion. Grade 1 diastolic dysfunction. LA & RA normal in size. RV normal in size & function. No aortic stenosis or regurg. No mitral stenosis & trivial regurg. No pulmonic regurg.       CXR PA and Lateral:   03/25/2016 :    I personally reviewed images and agree with radiology impression as follows:    Right middle lobe consolidation consistent with pneumonia. Postoperative change with scarring on the left. Stable cardiomediastinal silhouette   Assessment & Plan:

## 2016-03-25 NOTE — Telephone Encounter (Signed)
Ok to refill whatever rx tammy NP gave her

## 2016-03-25 NOTE — Telephone Encounter (Signed)
Rx printed, signed and up front for pick up  Spoke with the pt's spouse and notified of this  He verbalized understanding and nothing further needed

## 2016-03-25 NOTE — Patient Instructions (Addendum)
Doxycycline 100 mg twice daily x 10 days  Prednisone 10 mg take  4 each am x 2 days,   2 each am x 2 days,  1 each am x 2 days and stop   Work on inhaler technique:  relax and gently blow all the way out then take a nice smooth deep breath back in, triggering the inhaler at same time you start breathing in.  Hold for up to 5 seconds if you can. Blow out thru nose. Rinse and gargle with water when done    For cough / congestion mucinex 1200 mg every 12 hours as needed and use the xopenex hfa more regularly to improve the mucus flow   Please remember to go to the x-ray department downstairs for your tests - we will call you with the results when they are available.     Keep your follow up appt with Dr Ashok Cordia, call sooner if needed

## 2016-03-25 NOTE — Telephone Encounter (Signed)
Called the pt to discuss cxr results  She states that while here in the office she meant to ask for hydromet rx, but she forgot  She is asking for rx for this, states, TP prescribed it in the past and "It was the best thing ever"  She is aware that she will have to come in and pick up rx if MW wants to prescribe  Please advise thanks

## 2016-03-25 NOTE — Progress Notes (Signed)
Spoke with pt and notified of results per Dr. Wert. Pt verbalized understanding and denied any questions. 

## 2016-03-26 ENCOUNTER — Encounter: Payer: Self-pay | Admitting: Internal Medicine

## 2016-03-26 DIAGNOSIS — J180 Bronchopneumonia, unspecified organism: Secondary | ICD-10-CM | POA: Insufficient documentation

## 2016-03-26 NOTE — Assessment & Plan Note (Signed)
See cxr 03/25/2016 > rx with doxy x 10 days and f/u in 2 weeks rec    Note multiple allergies limit further outpt rx and if worsens advised will need admit

## 2016-03-26 NOTE — Assessment & Plan Note (Addendum)
Spirometry  09/25/15 FEV1 1.58  (70%)  Ratio 55 on flovent 110 2bid  - 03/25/2016  After extensive coaching HFA effectiveness =   75%  Mild flare in setting of uri/ prob RML bronchopna so rx with Prednisone 10 mg take  4 each am x 2 days,   2 each am x 2 days,  1 each am x 2 days and stop   - The proper method of use, as well as anticipated side effects, of a metered-dose inhaler are discussed and demonstrated to the patient. Improved effectiveness after extensive coaching during this visit to a level of approximately 75 % from a baseline of 50 % > continue HFA but work on technique  May need consideration for laba/ics if cardiac status allows as has moderate airflow obst pattern at baseline just on ICS and ? Is this more copd in this intermittent smoker.  I had an extended discussion with the patient reviewing all relevant studies completed to date including previous pfts and office notes  and  lasting 25 minutes of a 40  minute acute office  visit in pt not previously known to me   re  non-specific but potentially very serious pulmonary symptoms of unknown etiology and new dx bronchopna (see separate a/p) .  Each maintenance medication was reviewed in detail including most importantly the difference between maintenance and prns and under what circumstances the prns are to be triggered using an action plan format that is not reflected in the computer generated alphabetically organized AVS.    Please see AVS for specific instructions unique to this office visit that I personally wrote and verbalized to the the pt in detail and then reviewed with pt  by my nurse highlighting any  changes in therapy recommended at today's visit to their plan of care.

## 2016-03-28 ENCOUNTER — Other Ambulatory Visit: Payer: Self-pay | Admitting: Pulmonary Disease

## 2016-03-28 ENCOUNTER — Telehealth: Payer: Self-pay | Admitting: Pulmonary Disease

## 2016-03-28 NOTE — Telephone Encounter (Signed)
Spoke with the pt  She states needs PA for Xopenex inhaler  I called [facility] and spoke with Rachel Moulds  She states that she was able to push rx through with new ins and it is approved  She is however, waiting on the inhaler, had to order medicine  She will call pt when ready for pick up  Pt aware and nothing further needed

## 2016-04-01 ENCOUNTER — Telehealth: Payer: Self-pay | Admitting: *Deleted

## 2016-04-01 NOTE — Telephone Encounter (Signed)
PA done online [email or web address] for vivelle-dot 0.05 mg twice weekly, will wait for response.

## 2016-04-02 ENCOUNTER — Telehealth: Payer: Self-pay

## 2016-04-02 ENCOUNTER — Other Ambulatory Visit: Payer: Self-pay | Admitting: Internal Medicine

## 2016-04-02 DIAGNOSIS — F411 Generalized anxiety disorder: Secondary | ICD-10-CM

## 2016-04-02 NOTE — Telephone Encounter (Signed)
Medication approved 0.05 mg through 03/03/2017

## 2016-04-02 NOTE — Telephone Encounter (Signed)
Xanax faxed to pharm

## 2016-04-08 ENCOUNTER — Ambulatory Visit (INDEPENDENT_AMBULATORY_CARE_PROVIDER_SITE_OTHER): Payer: Medicare Other | Admitting: Pulmonary Disease

## 2016-04-08 ENCOUNTER — Encounter: Payer: Self-pay | Admitting: Pulmonary Disease

## 2016-04-08 ENCOUNTER — Ambulatory Visit (INDEPENDENT_AMBULATORY_CARE_PROVIDER_SITE_OTHER)
Admission: RE | Admit: 2016-04-08 | Discharge: 2016-04-08 | Disposition: A | Discharge status: Home / Self Care | Payer: Medicare Other | Source: Ambulatory Visit | Attending: Pulmonary Disease | Admitting: Pulmonary Disease

## 2016-04-08 VITALS — BP 102/80 | HR 65 | Ht 65.0 in | Wt 155.6 lb

## 2016-04-08 DIAGNOSIS — J189 Pneumonia, unspecified organism: Secondary | ICD-10-CM

## 2016-04-08 DIAGNOSIS — J181 Lobar pneumonia, unspecified organism: Secondary | ICD-10-CM | POA: Diagnosis not present

## 2016-04-08 DIAGNOSIS — J4540 Moderate persistent asthma, uncomplicated: Secondary | ICD-10-CM

## 2016-04-08 DIAGNOSIS — J309 Allergic rhinitis, unspecified: Secondary | ICD-10-CM

## 2016-04-08 MED ORDER — PREDNISONE 10 MG PO TABS: ORAL_TABLET | ORAL | 0 refills | Status: DC

## 2016-04-08 MED ORDER — FLUTTER DEVI: 1.0000 | Freq: Every day | 0 refills | Status: DC

## 2016-04-08 NOTE — Addendum Note (Signed)
Addended by: Jannette Spanner on: 04/08/2016 05:14 PM   Modules accepted: Orders

## 2016-04-08 NOTE — Patient Instructions (Addendum)
   Call me if your breathing is not significantly better by the end of the week.  We will call you with your x-ray results.  You can restart the Mucinex and take with plenty of water.  Use the Flutter Valve by blowing into it 3 times in a row twice daily to help get up the mucus.  I'm starting you on a Prednisone Taper:  Take 4 pills daily x 4 days, then  Take 3 pills daily x 4 days, then  Take 2 pills daily x 4 days, then   Take 1 pill daily until gone (4 days)  I will see you back in 2 months or sooner if needed.  TESTS ORDERED: 1. CXR PA/LAT at follow-up appointment  2. CXR PA/LAT today

## 2016-04-08 NOTE — Progress Notes (Signed)
Patient seen in the office today and instructed on use of flutter device.  Patient expressed understanding and demonstrated technique. 

## 2016-04-08 NOTE — Progress Notes (Signed)
Subjective:    Patient ID: Elizabeth Mcconnell, female    DOB: [birth year], 71 y.o.   MRN: [id number]  C.C.:  Follow-up for right Middle Lobe Community Acquired Pneumonia, Moderate, Persistent Asthma, Chronic Allergic Rhinitis, & Tobacco Use Disorder.  HPI  Right Middle Lobe Community Acquired Pneumonia: Patient seen on 03/25/16. Prescribed doxycycline for 10 days along with a prednisone taper. X-ray performed at last appointment did show right middle lobe consolidation. Patient reports she was previously improving. She continues to have a cough that feels like she cannot expectorate the mucus. Does have a sick contact through her husband recently. Previously was taking Mucinex.   Moderate, Persistent Asthma: Early prescribed Flovent HFA & Singulair. She reports a nonproductive cough. She reports a coarse wheeze that is only intermittent. She reports she is having nocturnal awakenings on a nightly basis with breathing problems.   Chronic Allergic Rhinitis: Prescribed Flonase & Singulair. She reports she has significant sinus congestion & drainage.   Tobacco Use Disorder:  Previously smoking sporadically. Recommended nicotine replacement with gum at last appointment.  Review of Systems She reports some myalgias and mild fatigue. Denies any fever or sweats. Questionable chills but no rigors. No chest tightness or pressure.   Allergies  Allergen Reactions  . Tramadol Other (See Comments)    Severe dizziness, weakness  . Amoxicillin-Pot Clavulanate Nausea And Vomiting  . Flagyl [Metronidazole] Nausea And Vomiting  . Flecainide     dizziness  . Omnicef [Cefdinir] Nausea And Vomiting  . Avelox [Moxifloxacin] Hives, Itching and Rash    Current Outpatient Prescriptions on File Prior to Visit  Medication Sig Dispense Refill  . acetaminophen (TYLENOL) 650 MG CR tablet Take 1,300 mg by mouth at bedtime. Second dose during day if needed    . ALPRAZolam (XANAX) 0.25 MG tablet TAKE 1 TABLET 2  TIMES DAILY AS NEEDED FOR anxiety 60 tablet 2  . cyanocobalamin 2000 MCG tablet Take 1 tablet (2,000 mcg total) by mouth daily. 90 tablet 3  . dextromethorphan-guaiFENesin (MUCINEX DM) 30-600 MG 12hr tablet Take 1 tablet by mouth at bedtime.    [name] diltiazem (CARDIZEM CD) 300 MG 24 hr capsule TAKE 1 CAPSULE BY MOUTH NIGHTLY AT BEDTIME 30 capsule 11  . diltiazem (CARDIZEM) 30 MG tablet Cardizem 30mg  -- take 1 tablet by mouth every 4 hours AS NEEDED for heart rate >100 as long as blood pressure >100. 45 tablet 2  . dofetilide (TIKOSYN) 250 MCG capsule Take 1 capsule (250 mcg total) by mouth 2 (two) times daily. 14 capsule 0  . ELIQUIS 5 MG TABS tablet TAKE 1 TABLET BY MOUTH 2 TIMES DAILY 180 tablet 2  . estradiol (VIVELLE-DOT) 0.05 MG/24HR patch Place 1 patch (0.05 mg total) onto the skin 2 (two) times a week. 8 patch 12  . fluticasone (FLONASE) 50 MCG/ACT nasal spray USE 2 SPRAYS IN EACH NOSTRIL 2 TIMES DAILY 16 g 1  . fluticasone (FLOVENT HFA) 110 MCG/ACT inhaler Inhale 2 puffs into the lungs 2 (two) times daily. 1 Inhaler 12  . HYDROcodone-homatropine (HYDROMET) 5-1.5 MG/5ML syrup Take 5 mLs by mouth every 6 (six) hours as needed for cough. 240 mL 0  . levalbuterol (XOPENEX HFA) 45 MCG/ACT inhaler USE ONE TO TWO PUFFS EVERY 4 HOURS AS NEEDED 15 g 2  . magnesium oxide (MAG-OX) 400 MG tablet Take 1 tablet (400 mg total) by mouth daily. 30 tablet 6  . Melatonin 3 MG TABS Take 1 tablet by mouth daily as needed. For sleep    .  montelukast (SINGULAIR) 10 MG tablet Take 1 tablet (10 mg total) by mouth daily. 30 tablet 5  . doxycycline (VIBRA-TABS) 100 MG tablet Take 1 tablet (100 mg total) by mouth 2 (two) times daily. (Patient not taking: Reported on 04/08/2016) 20 tablet 0  . predniSONE (DELTASONE) 10 MG tablet Take  4 each am x 2 days,   2 each am x 2 days,  1 each am x 2 days and stop (Patient not taking: Reported on 04/08/2016) 14 tablet 0   No current facility-administered medications on file prior to  visit.     Past Medical History:  Diagnosis Date  . Adenomatous polyp of colon 10/2013   f/u colo 10/2018  . Allergic rhinitis   . ALLERGIC RHINITIS   . ANXIETY    "situational" (02/14/2015)  . Arthritis    "neck, back, hands" (02/14/2015)  . Asthma   . Atrial fibrillation with RVR ([place])   . Atrial flutter ([place])    failed cardioversion 11/2012; s/p ablation 02-02-2013 by Dr Rayann Heman  . Chronic bronchitis ([place])    "I'll have it most years" (02/14/2015)  . Diverticulosis   . Endometriosis   . Essential hypertension 05/30/2014  . Oral candidiasis   . Osteopenia 08/2015   T score -1.7 FRAX 14%/4.4% overall stable from prior DEXA.  [name] Pneumothorax on left 1996   left lower lung collapse s/p resection  . Tobacco abuse     Past Surgical History:  Procedure Laterality Date  . ABDOMINAL HYSTERECTOMY    . APPENDECTOMY  1978  . ATRIAL FLUTTER ABLATION N/A 02/02/2013   Procedure: ATRIAL FLUTTER ABLATION;  Surgeon: Coralyn Mark, MD;  Location: [facility];  Service: Cardiovascular;  Laterality: N/A;  . CARDIOVERSION N/A 11/27/2012   Procedure: CARDIOVERSION;  Surgeon: Thayer Headings, MD;  Location: [facility];  Service: Cardiovascular;  Laterality: N/A;  . [place]; 1978  . LAPAROSCOPIC PARTIAL COLECTOMY N/A 05/04/2015   Procedure: LAPAROSCOPIC LOW ANTERIOR RESECTION LAPAROSCOPIC LYSIS OF ADHESIONS, SPLENIC FLEXURE MOBILIZATION;  Surgeon: Michael Boston, MD;  Location: [facility];  Service: General;  Laterality: N/A;  . LUNG REMOVAL, PARTIAL Left 02/1965   LLL  . TOTAL ABDOMINAL HYSTERECTOMY W/ BILATERAL SALPINGOOPHORECTOMY  1984   Endometriosis    Family History  Problem Relation Age of Onset  . Stroke Father 61  . Hypertension Father   . Heart disease Father   . Diabetes Mother   . Colon cancer Neg Hx   . Rectal cancer Neg Hx   . Stomach cancer Neg Hx   . Asthma Maternal Grandfather   . Asthma Child     Social History   Social History  . Marital status: Married     Spouse name: N/A  . Number of children: 2  . Years of education: N/A   Occupational History  . 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  [employer]   Social History Main Topics  . Smoking status: Former Smoker    Packs/day: 0.25    Years: 43.00    Types: Cigarettes  . Smokeless tobacco: Never Used  . Alcohol use 8.4 oz/week    14 Standard drinks or equivalent per week     Comment: daily wine  . Drug use: No  . Sexual activity: Yes    Birth control/ protection: Surgical, Post-menopausal     Comment: HYST-1st intercourse 71 yo-Fewer than 5 partners   Other Topics Concern  . None   Social History Narrative  Lives with spouse;    2 children      [facility]:   She is from [place]. 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 Previously has attended business school. Previously did office work and Programmer, applications at [employer]. She has also worked for [employer]. She does have a cat currently. She has indoor plants. No mold exposure.      Objective:   Physical Exam BP 102/80 (BP Location: Right Arm, Patient Position: Sitting, Cuff Size: Normal)   Pulse 65   Ht 5\' 5"  (1.651 m)   Wt 155 lb 9.6 oz (70.6 kg)   SpO2 95%   BMI 25.89 kg/m   General:  Awake. Alert. No distress.  Integument:  Warm & dry. No rash on exposed skin.  Extremities:  No cyanosis or clubbing.  HEENT:  Moist mucus membranes. No oral ulcers. No scleral injection or icterus. Mild bilateral nasal turbinate swelling with retropharyngeal cobblestoning. Cardiovascular:  Regular rate. No edema. No appreciable JVD.  Pulmonary:  Coarse wheeze bilaterally. Good aeration bilaterally. Normal work of breathing on room air. Abdomen: Soft. Normal bowel sounds. Nondistended.  Musculoskeletal:  Normal bulk and tone. No joint deformity or effusion appreciated.  PFT 09/25/15: FVC 2.85 L (95%) FEV1 1.58 L (70%) FEV1/FVC 0.55 FEF 25-75 0.61 L (33%) TLC 4.75 L (92%) RV 73% DLCO uncorrected  58%  IMAGING CXR PA/LAT 03/25/16 (personally reviewed by me): Patchy opacification within right middle lobe. No pleural effusion appreciated. Heart normal in size & mediastinum normal in contour.  CXR PA/LAT 08/02/14 (per radiologist): No active disease. Hyperinflation. Fibrotic changes LUL.  CARDIAC EKG (08/16/15):  RBB & QTc 470ms.  TTE (08/05/12): LV with mild basal septal hypertrophy. EF 60-65%. Normal regional wall motion. Grade 1 diastolic dysfunction. LA & RA normal in size. RV normal in size & function. No aortic stenosis or regurg. No mitral stenosis & trivial regurg. No pulmonic regurg.     Assessment & Plan:  71 y.o. female with Recent right middle lobe pneumonia and underlying moderate, persistent asthma, chronic allergic rhinitis, and tobacco use disorder. reportedly patient was previously recovering from her recent pneumonia which appears to be within the right middle lobe on my review of her previous x-ray imaging. She seems to have a mild exacerbation of her underlying asthma and would benefit from airway clearance. Holding off on further antibiotics at this time. Instructed patient to notify my office if she had any new breathing problems or questions before next appointment.  1. Right Middle Lobe Pneumonia:  Repeat chest x-ray PA/LAT today. Holding off on further antibiotic therapy at this time. Prescribing a flutter valve/acappella for airway clearance.  2. Moderate, Persistent Asthma:  Prescribing a prednisone taper to help with wheezing. Continuing on Singulair and Flovent. No changes at this time.  3. Chronic Allergic Rhinitis: Continuing Flonase and Singulair. No changes.  4. Tobacco Use Disorder:   Not addressed at this appointment.  5. Health Maintenance: S/P Influenza Vaccine September 2017, Tdap February 2014, Prevnar April 2016, & Pneumovax February 2016.  6. Follow-up: Patient to return to clinic in 2 months or sooner if needed.   Sonia Baller Ashok Cordia, M.D. [facility]  Pulmonary & Critical Care Pager:  [phone] After 3pm or if no response, call [phone] 4:38 PM 04/08/16

## 2016-04-09 ENCOUNTER — Other Ambulatory Visit: Payer: Self-pay | Admitting: Cardiovascular Disease

## 2016-04-10 ENCOUNTER — Telehealth: Payer: Self-pay | Admitting: Pulmonary Disease

## 2016-04-10 ENCOUNTER — Ambulatory Visit: Payer: Medicare Other

## 2016-04-10 ENCOUNTER — Other Ambulatory Visit: Payer: Self-pay | Admitting: *Deleted

## 2016-04-10 ENCOUNTER — Ambulatory Visit: Payer: Medicare Other | Admitting: Pulmonary Disease

## 2016-04-10 DIAGNOSIS — J4541 Moderate persistent asthma with (acute) exacerbation: Secondary | ICD-10-CM

## 2016-04-10 MED ORDER — DILTIAZEM HCL ER COATED BEADS 300 MG PO CP24: ORAL_CAPSULE | ORAL | 11 refills | Status: DC

## 2016-04-10 NOTE — Telephone Encounter (Signed)
lmomtcb x1 

## 2016-04-10 NOTE — Telephone Encounter (Signed)
That's fine. Please send in a Rx for Doxycycline 100mg  po BID x 7 days. Thanks.

## 2016-04-10 NOTE — Telephone Encounter (Signed)
Okay to refill this or should it be deferred to the [facility] clinic? I saw this phone note:   Barkley Boards, RN     01/22/16 3:36 PM  Note    I spoke with the pt's husband since the pt is currently not at home.  I made him aware that with the pt's current diagnosis of Atrial Fibrillation she is being managed by the appropriate team.  I did not feel that she needs to arrange a follow-up appointment with Dr Burt Knack.  I will send this message to Dr Burt Knack for advisement.      I do not see where Dr Burt Knack advised.  Please advise. Thanks, MI

## 2016-04-10 NOTE — Telephone Encounter (Signed)
Notes Recorded by Javier Glazier, MD on 04/09/2016 at 8:35 AM EST Please let the patient know that I reviewed her x-ray and it shows her right middle lung pneumonia has essentially resolved. There is no other abnormality. Thank you. ----------------------------------------------------- Spoke with pt. She is aware of results. While on the phone, the pt asked if JN would be okay with giving her a prescription for Doxy to take on a trip with her just in case she gets sick. Pt leaves for her trip on Saturday.  JN - please advise. Thanks.

## 2016-04-10 NOTE — Telephone Encounter (Signed)
Okay to refill per Dr Burt Knack.

## 2016-04-11 MED ORDER — DOXYCYCLINE HYCLATE 100 MG PO TABS: 100.0000 mg | ORAL_TABLET | Freq: Two times a day (BID) | ORAL | 0 refills | Status: DC

## 2016-04-11 NOTE — Telephone Encounter (Signed)
Spoke with pt. She is aware that we will be sending in this prescription. Nothing further was needed.

## 2016-04-11 NOTE — Telephone Encounter (Signed)
Patient returned call, CB is [phone].  Pharmacy is [facility].

## 2016-04-29 ENCOUNTER — Other Ambulatory Visit: Payer: Self-pay | Admitting: Adult Health

## 2016-05-03 ENCOUNTER — Encounter: Payer: Self-pay | Admitting: Internal Medicine

## 2016-05-09 ENCOUNTER — Inpatient Hospital Stay (HOSPITAL_COMMUNITY)
Admission: EM | Admit: 2016-05-09 | Discharge: 2016-05-13 | DRG: 390 | Disposition: A | Discharge status: Home / Self Care | Payer: Medicare Other | Attending: Internal Medicine | Admitting: Internal Medicine

## 2016-05-09 ENCOUNTER — Encounter (HOSPITAL_COMMUNITY): Payer: Self-pay

## 2016-05-09 ENCOUNTER — Emergency Department (HOSPITAL_COMMUNITY): Payer: Medicare Other

## 2016-05-09 ENCOUNTER — Ambulatory Visit (INDEPENDENT_AMBULATORY_CARE_PROVIDER_SITE_OTHER): Payer: Medicare Other | Admitting: Internal Medicine

## 2016-05-09 ENCOUNTER — Encounter: Payer: Self-pay | Admitting: Internal Medicine

## 2016-05-09 VITALS — BP 142/90 | HR 60 | Temp 98.2°F | Ht 65.0 in | Wt 157.0 lb

## 2016-05-09 DIAGNOSIS — J4540 Moderate persistent asthma, uncomplicated: Secondary | ICD-10-CM | POA: Diagnosis present

## 2016-05-09 DIAGNOSIS — Z833 Family history of diabetes mellitus: Secondary | ICD-10-CM | POA: Diagnosis not present

## 2016-05-09 DIAGNOSIS — Z825 Family history of asthma and other chronic lower respiratory diseases: Secondary | ICD-10-CM | POA: Diagnosis not present

## 2016-05-09 DIAGNOSIS — Z0189 Encounter for other specified special examinations: Secondary | ICD-10-CM

## 2016-05-09 DIAGNOSIS — K56609 Unspecified intestinal obstruction, unspecified as to partial versus complete obstruction: Secondary | ICD-10-CM | POA: Diagnosis present

## 2016-05-09 DIAGNOSIS — F411 Generalized anxiety disorder: Secondary | ICD-10-CM | POA: Diagnosis present

## 2016-05-09 DIAGNOSIS — Z888 Allergy status to other drugs, medicaments and biological substances status: Secondary | ICD-10-CM | POA: Diagnosis not present

## 2016-05-09 DIAGNOSIS — Z885 Allergy status to narcotic agent status: Secondary | ICD-10-CM

## 2016-05-09 DIAGNOSIS — Z823 Family history of stroke: Secondary | ICD-10-CM

## 2016-05-09 DIAGNOSIS — Z7951 Long term (current) use of inhaled steroids: Secondary | ICD-10-CM | POA: Diagnosis not present

## 2016-05-09 DIAGNOSIS — I10 Essential (primary) hypertension: Secondary | ICD-10-CM | POA: Diagnosis present

## 2016-05-09 DIAGNOSIS — Z8249 Family history of ischemic heart disease and other diseases of the circulatory system: Secondary | ICD-10-CM

## 2016-05-09 DIAGNOSIS — Z7901 Long term (current) use of anticoagulants: Secondary | ICD-10-CM

## 2016-05-09 DIAGNOSIS — Z881 Allergy status to other antibiotic agents status: Secondary | ICD-10-CM | POA: Diagnosis not present

## 2016-05-09 DIAGNOSIS — Z9049 Acquired absence of other specified parts of digestive tract: Secondary | ICD-10-CM | POA: Diagnosis not present

## 2016-05-09 DIAGNOSIS — I4891 Unspecified atrial fibrillation: Secondary | ICD-10-CM | POA: Insufficient documentation

## 2016-05-09 DIAGNOSIS — Z902 Acquired absence of lung [part of]: Secondary | ICD-10-CM

## 2016-05-09 DIAGNOSIS — Z883 Allergy status to other anti-infective agents status: Secondary | ICD-10-CM

## 2016-05-09 DIAGNOSIS — K5651 Intestinal adhesions [bands], with partial obstruction: Principal | ICD-10-CM | POA: Diagnosis present

## 2016-05-09 DIAGNOSIS — R109 Unspecified abdominal pain: Secondary | ICD-10-CM

## 2016-05-09 DIAGNOSIS — Z720 Tobacco use: Secondary | ICD-10-CM

## 2016-05-09 DIAGNOSIS — R112 Nausea with vomiting, unspecified: Secondary | ICD-10-CM | POA: Diagnosis not present

## 2016-05-09 DIAGNOSIS — I480 Paroxysmal atrial fibrillation: Secondary | ICD-10-CM | POA: Diagnosis not present

## 2016-05-09 DIAGNOSIS — F17210 Nicotine dependence, cigarettes, uncomplicated: Secondary | ICD-10-CM | POA: Diagnosis present

## 2016-05-09 DIAGNOSIS — Z90710 Acquired absence of both cervix and uterus: Secondary | ICD-10-CM | POA: Diagnosis not present

## 2016-05-09 LAB — COMPREHENSIVE METABOLIC PANEL
ALT: 17 U/L (ref 14–54)
AST: 18 U/L (ref 15–41)
Albumin: 4.8 g/dL (ref 3.5–5.0)
Alkaline Phosphatase: 73 U/L (ref 38–126)
Anion gap: 11 (ref 5–15)
BUN: 14 mg/dL (ref 6–20)
CO2: 25 mmol/L (ref 22–32)
Calcium: 9.6 mg/dL (ref 8.9–10.3)
Chloride: 106 mmol/L (ref 101–111)
Creatinine, Ser: 0.93 mg/dL (ref 0.44–1.00)
GFR calc Af Amer: >60 mL/min (ref >60)
GFR calc non Af Amer: 60 mL/min — ABNORMAL LOW (ref >60)
Glucose, Bld: 120 mg/dL — ABNORMAL HIGH (ref 65–99)
Potassium: 3.8 mmol/L (ref 3.5–5.1)
Sodium: 142 mmol/L (ref 135–145)
Total Bilirubin: 1.1 mg/dL (ref 0.3–1.2)
Total Protein: 7.8 g/dL (ref 6.5–8.1)

## 2016-05-09 LAB — URINALYSIS, ROUTINE W REFLEX MICROSCOPIC
Glucose, UA: 50 mg/dL — AB
Hgb urine dipstick: NEGATIVE
Ketones, ur: 20 mg/dL — AB
Nitrite: NEGATIVE
Protein, ur: 100 mg/dL — AB
Specific Gravity, Urine: 1.025 (ref 1.005–1.030)
pH: 7 (ref 5.0–8.0)

## 2016-05-09 LAB — CBC
HCT: 48.2 % — ABNORMAL HIGH (ref 36.0–46.0)
Hemoglobin: 16.1 g/dL — ABNORMAL HIGH (ref 12.0–15.0)
MCH: 29.8 pg (ref 26.0–34.0)
MCHC: 33.4 g/dL (ref 30.0–36.0)
MCV: 89.1 fL (ref 78.0–100.0)
Platelets: 247 10*3/uL (ref 150–400)
RBC: 5.41 MIL/uL — ABNORMAL HIGH (ref 3.87–5.11)
RDW: 14.3 % (ref 11.5–15.5)
WBC: 11.4 10*3/uL — ABNORMAL HIGH (ref 4.0–10.5)

## 2016-05-09 LAB — LIPASE, BLOOD: Lipase: 36 U/L (ref 11–51)

## 2016-05-09 MED ORDER — DILTIAZEM HCL ER COATED BEADS 300 MG PO CP24: 300.0000 mg | ORAL_CAPSULE | Freq: Every day | ORAL | Status: DC

## 2016-05-09 MED ORDER — ONDANSETRON HCL 4 MG/2ML IJ SOLN
4.0000 mg | Freq: Once | INTRAMUSCULAR | Status: AC
  Administered 2016-05-09: 4 mg via INTRAVENOUS
  Filled 2016-05-09: qty 2

## 2016-05-09 MED ORDER — ACETAMINOPHEN 650 MG RE SUPP: 650.0000 mg | Freq: Four times a day (QID) | RECTAL | Status: DC | PRN

## 2016-05-09 MED ORDER — DOFETILIDE 250 MCG PO CAPS
250.0000 ug | ORAL_CAPSULE | Freq: Two times a day (BID) | ORAL | Status: DC
  Administered 2016-05-10 – 2016-05-13 (×7): 250 ug via ORAL
  Filled 2016-05-09 (×10): qty 1

## 2016-05-09 MED ORDER — DILTIAZEM HCL 25 MG/5ML IV SOLN
10.0000 mg | Freq: Once | INTRAVENOUS | Status: AC
  Administered 2016-05-09: 10 mg via INTRAVENOUS
  Filled 2016-05-09: qty 5

## 2016-05-09 MED ORDER — FLUTICASONE PROPIONATE 50 MCG/ACT NA SUSP
1.0000 | Freq: Every day | NASAL | Status: DC
  Administered 2016-05-10 – 2016-05-13 (×2): 1 via NASAL
  Filled 2016-05-09: qty 16

## 2016-05-09 MED ORDER — LEVALBUTEROL HCL 1.25 MG/0.5ML IN NEBU
1.2500 mg | INHALATION_SOLUTION | Freq: Three times a day (TID) | RESPIRATORY_TRACT | Status: DC
  Administered 2016-05-10 – 2016-05-11 (×5): 1.25 mg via RESPIRATORY_TRACT
  Filled 2016-05-09 (×9): qty 0.5

## 2016-05-09 MED ORDER — VITAMIN B-12 1000 MCG PO TABS
2000.0000 ug | ORAL_TABLET | Freq: Every day | ORAL | Status: DC
  Administered 2016-05-13: 2000 ug via ORAL
  Filled 2016-05-09 (×4): qty 2

## 2016-05-09 MED ORDER — ACETAMINOPHEN 325 MG PO TABS
650.0000 mg | ORAL_TABLET | Freq: Four times a day (QID) | ORAL | Status: DC | PRN
  Administered 2016-05-12: 650 mg via ORAL
  Filled 2016-05-09: qty 2

## 2016-05-09 MED ORDER — FLUTICASONE PROPIONATE HFA 110 MCG/ACT IN AERO: 2.0000 | INHALATION_SPRAY | Freq: Two times a day (BID) | RESPIRATORY_TRACT | Status: DC

## 2016-05-09 MED ORDER — SODIUM CHLORIDE 0.9 % IV BOLUS (SEPSIS)
1000.0000 mL | Freq: Once | INTRAVENOUS | Status: AC
  Administered 2016-05-09: 1000 mL via INTRAVENOUS

## 2016-05-09 MED ORDER — ESTRADIOL 0.05 MG/24HR TD PTWK
0.0500 mg | MEDICATED_PATCH | TRANSDERMAL | Status: DC
  Administered 2016-05-13: 0.05 mg via TRANSDERMAL
  Filled 2016-05-09: qty 1

## 2016-05-09 MED ORDER — HYDRALAZINE HCL 20 MG/ML IJ SOLN: 5.0000 mg | INTRAMUSCULAR | Status: DC | PRN

## 2016-05-09 MED ORDER — SODIUM CHLORIDE 0.9 % IV SOLN
INTRAVENOUS | Status: DC
  Administered 2016-05-09 – 2016-05-13 (×5): via INTRAVENOUS

## 2016-05-09 MED ORDER — MORPHINE SULFATE (PF) 4 MG/ML IV SOLN
4.0000 mg | Freq: Once | INTRAVENOUS | Status: AC
  Administered 2016-05-09: 4 mg via INTRAVENOUS
  Filled 2016-05-09: qty 1

## 2016-05-09 MED ORDER — MAGNESIUM OXIDE 400 (241.3 MG) MG PO TABS
400.0000 mg | ORAL_TABLET | Freq: Every day | ORAL | Status: DC
  Administered 2016-05-10 – 2016-05-12 (×3): 400 mg via ORAL
  Filled 2016-05-09 (×5): qty 1

## 2016-05-09 MED ORDER — NICOTINE 21 MG/24HR TD PT24
21.0000 mg | MEDICATED_PATCH | Freq: Every day | TRANSDERMAL | Status: DC
  Filled 2016-05-09 (×2): qty 1

## 2016-05-09 MED ORDER — SODIUM CHLORIDE 0.9% FLUSH
3.0000 mL | Freq: Two times a day (BID) | INTRAVENOUS | Status: DC
  Administered 2016-05-11 – 2016-05-13 (×2): 3 mL via INTRAVENOUS

## 2016-05-09 MED ORDER — MELATONIN 3 MG PO TABS: 3.0000 mg | ORAL_TABLET | Freq: Every evening | ORAL | Status: DC | PRN

## 2016-05-09 MED ORDER — MORPHINE SULFATE (PF) 4 MG/ML IV SOLN
1.0000 mg | INTRAVENOUS | Status: DC | PRN
Start: 2016-05-09 — End: 2016-05-13
  Administered 2016-05-10: 1 mg via INTRAVENOUS
  Filled 2016-05-09: qty 1

## 2016-05-09 MED ORDER — IOPAMIDOL (ISOVUE-300) INJECTION 61%
100.0000 mL | Freq: Once | INTRAVENOUS | Status: AC | PRN
  Administered 2016-05-09: 100 mL via INTRAVENOUS

## 2016-05-09 MED ORDER — ALPRAZOLAM 0.25 MG PO TABS: 0.2500 mg | ORAL_TABLET | Freq: Two times a day (BID) | ORAL | Status: DC | PRN

## 2016-05-09 MED ORDER — LEVALBUTEROL HCL 1.25 MG/0.5ML IN NEBU
1.2500 mg | INHALATION_SOLUTION | Freq: Four times a day (QID) | RESPIRATORY_TRACT | Status: DC
  Administered 2016-05-09: 1.25 mg via RESPIRATORY_TRACT
  Filled 2016-05-09: qty 0.5

## 2016-05-09 MED ORDER — DILTIAZEM HCL-DEXTROSE 100-5 MG/100ML-% IV SOLN (PREMIX)
5.0000 mg/h | INTRAVENOUS | Status: DC
  Administered 2016-05-09: 5 mg/h via INTRAVENOUS
  Filled 2016-05-09: qty 100

## 2016-05-09 MED ORDER — MONTELUKAST SODIUM 10 MG PO TABS
10.0000 mg | ORAL_TABLET | Freq: Every day | ORAL | Status: DC
  Administered 2016-05-13: 10 mg via ORAL
  Filled 2016-05-09 (×4): qty 1

## 2016-05-09 MED ORDER — ONDANSETRON HCL 4 MG/2ML IJ SOLN: 4.0000 mg | Freq: Three times a day (TID) | INTRAMUSCULAR | Status: DC | PRN

## 2016-05-09 MED ORDER — IOPAMIDOL (ISOVUE-300) INJECTION 61%
INTRAVENOUS | Status: AC
  Filled 2016-05-09: qty 100

## 2016-05-09 NOTE — Assessment & Plan Note (Signed)
Given hx and exam, I have higher suspicion for recurrent bowel obstruction; diff would include esophagitis and intractable n/v or pancreatitis.  D/w pt - unfortunately I do not think she will be able to avoid the ER, and I advise to present there as needs urgent eval to r/o obstruction with CT, labs. Pt agrees

## 2016-05-09 NOTE — Progress Notes (Signed)
PHARMACIST - PHYSICIAN ORDER COMMUNICATION  CONCERNING: P&T Medication Policy on Herbal Medications  DESCRIPTION:  This patient's order for:  melatonin  has been noted.  This product(s) is classified as an "herbal" or natural product. Due to a lack of definitive safety studies or FDA approval, nonstandard manufacturing practices, plus the potential risk of unknown drug-drug interactions while on inpatient medications, the Pharmacy and Therapeutics Committee does not permit the use of "herbal" or natural products of this type within [facility].   ACTION TAKEN: The pharmacy department is unable to verify this order at this time and your patient has been informed of this safety policy. Please reevaluate patient's clinical condition at discharge and address if the herbal or natural product(s) should be resumed at that time.  Thanks Dorrene German 05/09/2016 11:31 PM

## 2016-05-09 NOTE — H&P (Addendum)
History and Physical    Elizabeth Mcconnell JSE:[id number] DOB: August 13, 1945 DOA: 05/09/2016  Referring MD/NP/PA:   PCP: [name], MD   Patient coming from:  The patient is coming from home.  At baseline, pt is independent for most of ADL.  Chief Complaint: Nausea, vomiting, abdominal pain  HPI: Elizabeth Mcconnell is a 71 y.o. female with medical history significant of hypertension, asthma, tobacco abuse, left pneumothorax, atrial fibrillation on Eliquis, partial colectomy due to bowel obstruction 05/04/15, s/p of LLL lung lobectomy, who presents with nausea, vomiting and abdominal pain.  Patient states that her symptoms started last night. She vomited 7 times without blood in the vomitus. No diarrhea. Her abdominal pain is located in the mid-abdomen, worse on the left side. It is constant, 8 out of 10 in severity, nonradiating. It is not aggravated, but alleviated slightly after vomiting. Last bowel movement was yesterday morning. Patient does not have fever, but feeling cold. No chest pain, shortness rest. She has mild dry cough, no runny nose or sore throat. Denies symptoms of UTI or unilateral weakness. Pt was seen by PCP and sent to ED due to the concerns of bowel obstruction.  ED Course: pt was found to have WBC 11.4, lipase is 36, urinalysis with small amount of leukocyte, electrolytes renal function okay, temperature normal, Afib with RVR on EKG. CT abdomen/pelvis showed small bowel obstruction with transition point in mid pelvis. Pt is admitted to SDU as inpt. Gen. surgeon (possiblly Dr. Lucia Gaskins) is consulted by Dr. [place] Singer  Review of Systems:   General: no fevers, has chills, no changes in body weight, has poor appetite, has fatigue HEENT: no blurry vision, hearing changes or sore throat Respiratory: no dyspnea, has coughing, no wheezing CV: no chest pain, no palpitations GI: has nausea, vomiting, abdominal pain, no diarrhea, constipation GU: no dysuria, burning on urination,  increased urinary frequency, hematuria  Ext: no leg edema Neuro: no unilateral weakness, numbness, or tingling, no vision change or hearing loss Skin: no rash, no skin tear. MSK: No muscle spasm, no deformity, no limitation of range of movement in spin Heme: No easy bruising.  Travel history: No recent long distant travel.  Allergy:  Allergies  Allergen Reactions  . Tramadol Other (See Comments)    Reaction:  Dizziness and weakness   . Amoxicillin-Pot Clavulanate Nausea And Vomiting and Other (See Comments)    Has patient had a PCN reaction causing immediate rash, facial/tongue/throat swelling, SOB or lightheadedness with hypotension: No Has patient had a PCN reaction causing severe rash involving mucus membranes or skin necrosis: No Has patient had a PCN reaction that required hospitalization No Has patient had a PCN reaction occurring within the last 10 years: No If all of the above answers are "NO", then may proceed with Cephalosporin use.  . Flagyl [Metronidazole] Nausea And Vomiting  . Flecainide Other (See Comments)    Reaction:  Dizziness   . Omnicef [Cefdinir] Nausea And Vomiting  . Avelox [Moxifloxacin] Hives and Itching    Past Medical History:  Diagnosis Date  . Adenomatous polyp of colon 10/2013   f/u colo 10/2018  . Allergic rhinitis   . ALLERGIC RHINITIS   . ANXIETY    "situational" (02/14/2015)  . Arthritis    "neck, back, hands" (02/14/2015)  . Asthma   . Atrial fibrillation with RVR ([place])   . Atrial flutter ([place])    failed cardioversion 11/2012; s/p ablation 02-02-2013 by Dr Rayann Heman  . Chronic bronchitis ([place])    "  I'll have it most years" (02/14/2015)  . Diverticulosis   . Endometriosis   . Essential hypertension 05/30/2014  . Oral candidiasis   . Osteopenia 08/2015   T score -1.7 FRAX 14%/4.4% overall stable from prior DEXA.  [name] Pneumothorax on left 1996   left lower lung collapse s/p resection  . Tobacco abuse     Past Surgical History:  Procedure  Laterality Date  . ABDOMINAL HYSTERECTOMY    . APPENDECTOMY  1978  . ATRIAL FLUTTER ABLATION N/A 02/02/2013   Procedure: ATRIAL FLUTTER ABLATION;  Surgeon: Coralyn Mark, MD;  Location: [facility];  Service: Cardiovascular;  Laterality: N/A;  . CARDIOVERSION N/A 11/27/2012   Procedure: CARDIOVERSION;  Surgeon: Thayer Headings, MD;  Location: [facility];  Service: Cardiovascular;  Laterality: N/A;  . [place]; 1978  . LAPAROSCOPIC PARTIAL COLECTOMY N/A 05/04/2015   Procedure: LAPAROSCOPIC LOW ANTERIOR RESECTION LAPAROSCOPIC LYSIS OF ADHESIONS, SPLENIC FLEXURE MOBILIZATION;  Surgeon: Michael Boston, MD;  Location: [facility];  Service: General;  Laterality: N/A;  . LUNG REMOVAL, PARTIAL Left 02/1965   LLL  . TOTAL ABDOMINAL HYSTERECTOMY W/ BILATERAL SALPINGOOPHORECTOMY  1984   Endometriosis    Social History:  reports that she has quit smoking. Her smoking use included Cigarettes. She has a 10.75 pack-year smoking history. She has never used smokeless tobacco. She reports that she drinks about 8.4 oz of alcohol per week . She reports that she does not use drugs.  Family History:  Family History  Problem Relation Age of Onset  . Asthma Maternal Grandfather   . Stroke Father 89  . Hypertension Father   . Heart disease Father   . Diabetes Mother   . Asthma Child   . Colon cancer Neg Hx   . Rectal cancer Neg Hx   . Stomach cancer Neg Hx      Prior to Admission medications   Medication Sig Start Date End Date Taking? Authorizing Provider  acetaminophen (TYLENOL) 650 MG CR tablet Take 650-1,300 mg by mouth at bedtime as needed for pain.    Yes Historical Provider, MD  ALPRAZolam (XANAX) 0.25 MG tablet Take 0.25 mg by mouth 2 (two) times daily as needed for anxiety.    Yes Historical Provider, MD  cyanocobalamin 2000 MCG tablet Take 1 tablet (2,000 mcg total) by mouth daily. 06/05/15  Yes Janith Lima, MD  diltiazem (CARDIZEM CD) 300 MG 24 hr capsule TAKE 1 CAPSULE BY MOUTH  NIGHTLY AT BEDTIME Patient taking differently: Take 300 mg by mouth at bedtime.  04/10/16  Yes Sherren Mocha, MD  diltiazem (CARDIZEM) 30 MG tablet Cardizem 30mg  -- take 1 tablet by mouth every 4 hours AS NEEDED for heart rate >100 as long as blood pressure >100. Patient taking differently: Take 30 mg by mouth every 4 (four) hours as needed (for heart rate > 100 as long as BP is >100).  11/13/15  Yes Sherran Needs, NP  dofetilide (TIKOSYN) 250 MCG capsule Take 1 capsule (250 mcg total) by mouth 2 (two) times daily. 02/17/15  Yes Amber Sena Slate, NP  ELIQUIS 5 MG TABS tablet TAKE 1 TABLET BY MOUTH 2 TIMES DAILY 10/04/15  Yes Sherren Mocha, MD  estradiol (VIVELLE-DOT) 0.05 MG/24HR patch Place 1 patch (0.05 mg total) onto the skin 2 (two) times a week. 08/07/15  Yes [name], MD  fluticasone (FLONASE) 50 MCG/ACT nasal spray USE 2 SPRAYS IN EACH NOSTRIL 2 TIMES DAILY 04/30/16  Yes Javier Glazier,  MD  fluticasone (FLOVENT HFA) 110 MCG/ACT inhaler Inhale 2 puffs into the lungs 2 (two) times daily. 11/28/15  Yes Janith Lima, MD  levalbuterol [facility] HFA) 45 MCG/ACT inhaler Inhale 1-2 puffs into the lungs every 4 (four) hours as needed for wheezing or shortness of breath.   Yes Historical Provider, MD  magnesium oxide (MAG-OX) 400 MG tablet Take 1 tablet (400 mg total) by mouth daily. Patient taking differently: Take 400 mg by mouth at bedtime.  02/13/15  Yes Sherren Mocha, MD  Melatonin 3 MG TABS Take 3 mg by mouth at bedtime as needed (for sleep).    Yes Historical Provider, MD  montelukast (SINGULAIR) 10 MG tablet Take 1 tablet (10 mg total) by mouth daily. 03/14/16  Yes Javier Glazier, MD    Physical Exam: Vitals:   05/09/16 2131 05/09/16 2134 05/09/16 2145 05/09/16 2230  BP:  (!) 138/114 148/86 (!) 141/101  Pulse: 116 104 72 97  Resp: 18 18 21 18   Temp:      TempSrc:      SpO2: 97% 96% 96% 93%   General: Not in acute distress HEENT:       Eyes: PERRL, EOMI, no scleral  icterus.       ENT: No discharge from the ears and nose, no pharynx injection, no tonsillar enlargement.        Neck: No JVD, no bruit, no mass felt. Heme: No neck lymph node enlargement. Cardiac: S1/S2, RRR, No murmurs, No gallops or rubs. Respiratory: No rales, wheezing, rhonchi or rubs. GI: mildly distended, has tenderness over RMQ, no rebound pain, no organomegaly, BS present. GU: No hematuria Ext: No pitting leg edema bilaterally. 2+DP/PT pulse bilaterally. Musculoskeletal: No joint deformities, No joint redness or warmth, no limitation of ROM in spin. Skin: No rashes.  Neuro: Alert, oriented X3, cranial nerves II-XII grossly intact, moves all extremities normally. Psych: Patient is not psychotic, no suicidal or hemocidal ideation.  Labs on Admission: I have personally reviewed following labs and imaging studies  CBC:  Recent Labs Lab 05/09/16 2006  WBC 11.4*  HGB 16.1*  HCT 48.2*  MCV 89.1  PLT 741   Basic Metabolic Panel:  Recent Labs Lab 05/09/16 2006  NA 142  K 3.8  CL 106  CO2 25  GLUCOSE 120*  BUN 14  CREATININE 0.93  CALCIUM 9.6   GFR: Estimated Creatinine Clearance: 54.1 mL/min (by C-G formula based on SCr of 0.93 mg/dL). Liver Function Tests:  Recent Labs Lab 05/09/16 2006  AST 18  ALT 17  ALKPHOS 73  BILITOT 1.1  PROT 7.8  ALBUMIN 4.8    Recent Labs Lab 05/09/16 2006  LIPASE 36   No results for input(s): AMMONIA in the last 168 hours. Coagulation Profile: No results for input(s): INR, PROTIME in the last 168 hours. Cardiac Enzymes: No results for input(s): CKTOTAL, CKMB, CKMBINDEX, TROPONINI in the last 168 hours. BNP (last 3 results) No results for input(s): PROBNP in the last 8760 hours. HbA1C: No results for input(s): HGBA1C in the last 72 hours. CBG: No results for input(s): GLUCAP in the last 168 hours. Lipid Profile: No results for input(s): CHOL, HDL, LDLCALC, TRIG, CHOLHDL, LDLDIRECT in the last 72 hours. Thyroid  Function Tests: No results for input(s): TSH, T4TOTAL, FREET4, T3FREE, THYROIDAB in the last 72 hours. Anemia Panel: No results for input(s): VITAMINB12, FOLATE, FERRITIN, TIBC, IRON, RETICCTPCT in the last 72 hours. Urine analysis:    Component Value Date/Time   COLORURINE AMBER (  A) 05/09/2016 1941   APPEARANCEUR HAZY (A) 05/09/2016 1941   LABSPEC 1.025 05/09/2016 1941   PHURINE 7.0 05/09/2016 1941   GLUCOSEU 50 (A) 05/09/2016 1941   GLUCOSEU NEGATIVE 10/21/2014 1019   HGBUR NEGATIVE 05/09/2016 1941   BILIRUBINUR SMALL (A) 05/09/2016 1941   KETONESUR 20 (A) 05/09/2016 1941   PROTEINUR 100 (A) 05/09/2016 1941   UROBILINOGEN 1.0 10/21/2014 1019   NITRITE NEGATIVE 05/09/2016 1941   LEUKOCYTESUR SMALL (A) 05/09/2016 1941   Sepsis Labs: @LABRCNTIP (procalcitonin:4,lacticidven:4) )No results found for this or any previous visit (from the past 240 hour(s)).   Radiological Exams on Admission: Ct Abdomen Pelvis W Contrast  Result Date: 05/09/2016 CLINICAL DATA:  Intermittent burning abdominal pain since last evening. History of atrial fibrillation and flutter, diverticulosis, GERD and asthma. EXAM: CT ABDOMEN AND PELVIS WITH CONTRAST TECHNIQUE: Multidetector CT imaging of the abdomen and pelvis was performed using the standard protocol following bolus administration of intravenous contrast. CONTRAST:  188mL ISOVUE-300 IOPAMIDOL (ISOVUE-300) INJECTION 61% COMPARISON:  CT from 05/01/2015 FINDINGS: Lower chest: No acute abnormality. Hepatobiliary: Mild hepatic steatosis. No biliary dilatation. Unremarkable gallbladder. No space-occupying mass of the liver. Pancreas: Unremarkable. No pancreatic ductal dilatation or surrounding inflammatory changes. Spleen: Normal in size without focal abnormality. Adrenals/Urinary Tract: Adrenal glands are unremarkable. Kidneys are normal, without renal calculi, focal lesion, or hydronephrosis. Bladder is unremarkable. Stomach/Bowel: Fluid-filled mild distention of the  stomach. There is a small sliding-type hiatal hernia. A duodenal diverticulum is identified off second portion. Dilated fluid-filled loops of jejunum identified in the mid abdomen extending to a transition point in the mid pelvis, series 2, image 57. Proximal to this, there is fecalized material within distended small bowel measuring up to 3.6 cm. Rather long segmental luminal narrowing is identified with mild transmural thickening and a transition point. Some of this may be simply due to under distention of bowel. Other differential possibilities might include changes from inflammatory bowel disease or potentially an infiltrating abnormalities such as lymphoma though believed less likely given lack of other ancillary findings nor adenopathy. Postoperative adhesion is favored given the presence of adjacent chain sutures from prior partial colectomy/sigmoid resection within the pelvis. Vascular/Lymphatic: Aortoiliac atherosclerosis.  No lymphadenopathy. Reproductive: Status post hysterectomy. No adnexal masses. Other: No abdominal wall hernia or abnormality. No abdominopelvic ascites. Musculoskeletal: Levoconvex curvature of the lumbar spine. 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 Grade 1 anterolisthesis of L4 on L5 secondary to facet arthropathy. IMPRESSION: 1. There is small bowel obstruction believed secondary to an adhesion in the mid pelvis given adjacent changes from prior partial colectomy. Obstruction secondary to changes from inflammatory bowel or an infiltrative neoplasm are believed less likely. 2. Mild hepatic steatosis. 3. Hysterectomy.  No adnexal mass. 4. Aortoiliac atherosclerosis. Electronically Signed   By: Ashley Royalty M.D.   On: 05/09/2016 21:50     EKG: Independently reviewed.  Atrial fibrillation with RVR, RAD, low voltage.  Assessment/Plan Principal Problem:   SBO (small bowel obstruction) Active Problems:   GAD (generalized anxiety disorder)   Asthma, moderate persistent   Essential  hypertension   Atrial fibrillation with RVR (HCC)   Tobacco abuse   SBO (small bowel obstruction): CT abdomen/pelvis showed small bowel obstruction with transition point in mid pelvis. This is likely due to adhesion from previous abdominal surgery. Currently patient is hemodynamically stable. Electrolytes okay. Gen. surgeon (possiblly Dr. Lucia Gaskins) is consulted by Dr. [place] Singer.  -Admit to SUD (due to the need of cardizem gtt for A fib with RVR) as inpt -NPO  -  prn NG tube -morphine prn pain -Prn Zofran prn nausea  -IVF: 1L NS, then 100 cc/h - NR/PTT/type & screen -Follow-up general surgeons recommendation  Atrial fibrillation with RVR: CHA2DS2-VASc Score is 3, needs oral anticoagulation. Patient is on Eliquis at home. She have had two doses of Eliquis today. HR was 123 initially-->down to 72 after two doses of IV cardizem (10 mg x 2), but then comes up to 123 again when I saw pt in ED.  -will hold Eliquis in case pt needs surgery. Pt is aware of the risk of stroke from holding Eliquis -start IV cardizem gtt -continue home oral cardizem and Tikosyn if pt can tolerate -check TSH and trop x 3    Asthma, moderate persistent: stable -prn Xopenex nebs -continue flovent inhaler and singular  HTN: -on Cardizem -prn IV hydralazine  GAD (generalized anxiety disorder): -continue home Xanax-->if not able to tolerate oral meds, will switch to prn ativan  Tobacco abuse: -Did counseling about importance of quitting smoking -Nicotine patch  Positive UA: UA has small amount of leukocyte, but patient does not have symptoms of UTI. -f/u urine culture  DVT ppx: SCD Code Status: Full code Family Communication: Yes, patient's husband at bed side Disposition Plan:  Anticipate discharge back to previous home environment Consults called:  Gen. surgeon Admission status: Inpatient/tele    Date of Service 05/09/2016    Ivor Costa [facility] Pager [phone]  If 7PM-7AM, please contact  night-coverage [email or web address] Password [facility] 05/09/2016, 11:26 PM

## 2016-05-09 NOTE — Assessment & Plan Note (Signed)
ECG not done, but IRIR by exam rate and volume controlled, cont current meds, but unable to take po meds at this time due to persistent intractable n/v

## 2016-05-09 NOTE — ED Notes (Signed)
Hospitalist,NIU at bedside.

## 2016-05-09 NOTE — ED Notes (Signed)
Pt has had nausea and vomiting since last night. She was sent here from PCP to rule out obstruction. Last emesis event at 1500. Denies diarrhea.

## 2016-05-09 NOTE — Progress Notes (Signed)
Pre visit review using our clinic review tool, if applicable. No additional management support is needed unless otherwise documented below in the visit note. 

## 2016-05-09 NOTE — ED Provider Notes (Signed)
[facility] DEPT Provider Note    By signing my name below, I, Bea Graff, attest that this documentation has been prepared under the direction and in the presence of Virgel Manifold, MD. Electronically Signed: Bea Graff, ED Scribe. 05/09/16. 9:56 PM.    History   Chief Complaint Chief Complaint  Patient presents with  . Emesis   The history is provided by the patient and medical records. No language interpreter was used.    Elizabeth Mcconnell is a 71 y.o. female with PMHx of atrial fibrillation, atrial flutter, diverticulosis, GERD and asthma who presents to the Emergency Department complaining of intermittent burning abdominal pain that began last night. She reports associated nausea and vomiting. She states the pain began after eating. She states the pain was better upon waking this morning but worsened this evening. She has not been able to eat much today but some soup and ginger ale. She reports having similar symptoms in the past but states this is worse. Pt reports seeing her PCP earlier today who suggested she be checked here at the ED. She has taken Zantac for pain with temporary relief. She denies modifying factors. She denies dysuria or other urinary problems, diarrhea, flatulence, fever, chills. She reports having an laparoscopic colectomy one year ago. She states she had a colonoscopy which showed a blockage and diverticulitis.   Past Medical History:  Diagnosis Date  . Adenomatous polyp of colon 10/2013   f/u colo 10/2018  . Allergic rhinitis   . ALLERGIC RHINITIS   . ANXIETY    "situational" (02/14/2015)  . Arthritis    "neck, back, hands" (02/14/2015)  . Asthma   . Atrial fibrillation with RVR ([place])   . Atrial flutter ([place])    failed cardioversion 11/2012; s/p ablation 02-02-2013 by Dr Rayann Heman  . Chronic bronchitis ([place])    "I'll have it most years" (02/14/2015)  . Diverticulosis   . Endometriosis   . Essential hypertension 05/30/2014  . Oral  candidiasis   . Osteopenia 08/2015   T score -1.7 FRAX 14%/4.4% overall stable from prior DEXA.  [name] Pneumothorax on left 1996   left lower lung collapse s/p resection  . Tobacco abuse     Patient Active Problem List   Diagnosis Date Noted  . Abdominal pain 05/09/2016  . Right middle lobe pneumonia ([place]) 04/08/2016  . Bronchopneumonia, unspecified organism 03/26/2016  . Other fatigue 07/19/2015  . B12 nutritional deficiency 05/30/2015  . 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 Paroxysmal atrial fibrillation ([place]) 02/14/2015  . CN (constipation) 10/31/2014  . Hyperlipidemia with target LDL less than 160 06/13/2014  . Atrial fibrillation with RVR ([place])   . Essential hypertension 05/30/2014  . Atrial flutter ([place]) 01/23/2013  . OA (osteoarthritis)   . GAD (generalized anxiety disorder) 08/08/2008  . ALLERGIC RHINITIS 08/08/2008  . Asthma, moderate persistent 01/15/2007    Past Surgical History:  Procedure Laterality Date  . ABDOMINAL HYSTERECTOMY    . APPENDECTOMY  1978  . ATRIAL FLUTTER ABLATION N/A 02/02/2013   Procedure: ATRIAL FLUTTER ABLATION;  Surgeon: Coralyn Mark, MD;  Location: [facility];  Service: Cardiovascular;  Laterality: N/A;  . CARDIOVERSION N/A 11/27/2012   Procedure: CARDIOVERSION;  Surgeon: Thayer Headings, MD;  Location: [facility];  Service: Cardiovascular;  Laterality: N/A;  . [place]; 1978  . LAPAROSCOPIC PARTIAL COLECTOMY N/A 05/04/2015   Procedure: LAPAROSCOPIC LOW ANTERIOR RESECTION LAPAROSCOPIC LYSIS OF ADHESIONS, SPLENIC FLEXURE MOBILIZATION;  Surgeon:  Michael Boston, MD;  Location: [facility];  Service: General;  Laterality: N/A;  . LUNG REMOVAL, PARTIAL Left 02/1965   LLL  . TOTAL ABDOMINAL HYSTERECTOMY W/ BILATERAL SALPINGOOPHORECTOMY  1984   Endometriosis    OB History    Gravida Para Term Preterm AB Living   2 2 2     2    SAB TAB Ectopic Multiple Live Births                   Home  Medications    Prior to Admission medications   Medication Sig Start Date End Date Taking? Authorizing Provider  acetaminophen (TYLENOL) 650 MG CR tablet Take 650-1,300 mg by mouth at bedtime as needed for pain.    Yes Historical Provider, MD  ALPRAZolam (XANAX) 0.25 MG tablet Take 0.25 mg by mouth 2 (two) times daily as needed for anxiety.    Yes Historical Provider, MD  cyanocobalamin 2000 MCG tablet Take 1 tablet (2,000 mcg total) by mouth daily. 06/05/15  Yes Janith [place], MD  diltiazem (CARDIZEM CD) 300 MG 24 hr capsule TAKE 1 CAPSULE BY MOUTH NIGHTLY AT BEDTIME Patient taking differently: Take 300 mg by mouth at bedtime.  04/10/16  Yes Sherren Mocha, MD  diltiazem (CARDIZEM) 30 MG tablet Cardizem 30mg  -- take 1 tablet by mouth every 4 hours AS NEEDED for heart rate >100 as long as blood pressure >100. Patient taking differently: Take 30 mg by mouth every 4 (four) hours as needed (for heart rate > 100 as long as BP is >100).  11/13/15  Yes Sherran Needs, NP  dofetilide (TIKOSYN) 250 MCG capsule Take 1 capsule (250 mcg total) by mouth 2 (two) times daily. 02/17/15  Yes Amber Sena Slate, NP  ELIQUIS 5 MG TABS tablet TAKE 1 TABLET BY MOUTH 2 TIMES DAILY 10/04/15  Yes Sherren Mocha, MD  estradiol (VIVELLE-DOT) 0.05 MG/24HR patch Place 1 patch (0.05 mg total) onto the skin 2 (two) times a week. 08/07/15  Yes [name], MD  fluticasone (FLONASE) 50 MCG/ACT nasal spray USE 2 SPRAYS IN EACH NOSTRIL 2 TIMES DAILY 04/30/16  Yes Javier Glazier, MD  fluticasone (FLOVENT HFA) 110 MCG/ACT inhaler Inhale 2 puffs into the lungs 2 (two) times daily. 11/28/15  Yes Janith [place], MD  levalbuterol [facility] HFA) 45 MCG/ACT inhaler Inhale 1-2 puffs into the lungs every 4 (four) hours as needed for wheezing or shortness of breath.   Yes Historical Provider, MD  magnesium oxide (MAG-OX) 400 MG tablet Take 1 tablet (400 mg total) by mouth daily. Patient taking differently: Take 400 mg by mouth at bedtime.   02/13/15  Yes Sherren Mocha, MD  Melatonin 3 MG TABS Take 3 mg by mouth at bedtime as needed (for sleep).    Yes Historical Provider, MD  montelukast (SINGULAIR) 10 MG tablet Take 1 tablet (10 mg total) by mouth daily. 03/14/16  Yes Javier Glazier, MD    Family History Family History  Problem Relation Age of Onset  . Asthma Maternal Grandfather   . Stroke Father 76  . Hypertension Father   . Heart disease Father   . Diabetes Mother   . Asthma Child   . Colon cancer Neg Hx   . Rectal cancer Neg Hx   . Stomach cancer Neg Hx     Social History Social History  Substance Use Topics  . Smoking status: Former Smoker    Packs/day: 0.25    Years: 43.00  Types: Cigarettes  . Smokeless tobacco: Never Used  . Alcohol use 8.4 oz/week    14 Standard drinks or equivalent per week     Comment: daily wine     Allergies   Tramadol; Amoxicillin-pot clavulanate; Flagyl [metronidazole]; Flecainide; Omnicef [cefdinir]; and Avelox [moxifloxacin]   Review of Systems Review of Systems A complete 10 system review of systems was obtained and all systems are negative except as noted in the HPI and PMH.    Physical Exam Updated Vital Signs BP 126/89   Pulse 82   Temp 97.8 F (36.6 C) (Oral)   Resp 17   SpO2 97%   Physical Exam  Constitutional: She is oriented to person, place, and time. She appears well-developed and well-nourished. No distress.  HENT:  Head: Normocephalic and atraumatic.  Eyes: EOM are normal.  Neck: Normal range of motion.  Cardiovascular: Normal rate, regular rhythm and normal heart sounds.   Pulmonary/Chest: Effort normal and breath sounds normal.  Abdominal: Soft. She exhibits no distension. There is tenderness in the epigastric area and left upper quadrant. There is no rebound and no guarding.  Musculoskeletal: Normal range of motion.  Neurological: She is alert and oriented to person, place, and time.  Skin: Skin is warm and dry.  Psychiatric: She has a  normal mood and affect. Judgment normal.  Nursing note and vitals reviewed.    ED Treatments / Results  DIAGNOSTIC STUDIES: Oxygen Saturation is 97% on RA, normal by my interpretation.   COORDINATION OF CARE: 7:57 PM- Will CT abdomen. Will order pain and nausea medication but pt declines at this time. Pt verbalizes understanding and agrees to plan.  8:15 PM- Informed that pt's HR was elevated between 130-15. Ordered EKG.  Medications  iopamidol (ISOVUE-300) 61 % injection (not administered)  sodium chloride 0.9 % bolus 1,000 mL (1,000 mLs Intravenous New Bag/Given 05/09/16 2027)  diltiazem (CARDIZEM) injection 10 mg (10 mg Intravenous Given 05/09/16 2033)  iopamidol (ISOVUE-300) 61 % injection 100 mL (100 mLs Intravenous Contrast Given 05/09/16 2117)    Labs (all labs ordered are listed, but only abnormal results are displayed) Labs Reviewed  COMPREHENSIVE METABOLIC PANEL - Abnormal; Notable for the following:       Result Value   Glucose, Bld 120 (*)    GFR calc non Af Amer 60 (*)    All other components within normal limits  CBC - Abnormal; Notable for the following:    WBC 11.4 (*)    RBC 5.41 (*)    Hemoglobin 16.1 (*)    HCT 48.2 (*)    All other components within normal limits  URINALYSIS, ROUTINE W REFLEX MICROSCOPIC - Abnormal; Notable for the following:    Color, Urine AMBER (*)    APPearance HAZY (*)    Glucose, UA 50 (*)    Bilirubin Urine SMALL (*)    Ketones, ur 20 (*)    Protein, ur 100 (*)    Leukocytes, UA SMALL (*)    Bacteria, UA RARE (*)    Squamous Epithelial / LPF 6-30 (*)    All other components within normal limits  LIPASE, BLOOD    EKG  EKG Interpretation  Date/Time:  Thursday May 09 2016 20:16:50 EST Ventricular Rate:  148 PR Interval:    QRS Duration: 107 QT Interval:  329 QTC Calculation: 469 R Axis:   -57 Text Interpretation:  Atrial fibrillation Ventricular bigeminy Left axis deviation RSR' in V1 or V2, right VCD or RVH Confirmed by  Emersynn Deatley  MD, Annie Main [phone]) on 05/09/2016 9:25:11 PM       Radiology Ct Abdomen Pelvis W Contrast  Result Date: 05/09/2016 CLINICAL DATA:  Intermittent burning abdominal pain since last evening. History of atrial fibrillation and flutter, diverticulosis, GERD and asthma. EXAM: CT ABDOMEN AND PELVIS WITH CONTRAST TECHNIQUE: Multidetector CT imaging of the abdomen and pelvis was performed using the standard protocol following bolus administration of intravenous contrast. CONTRAST:  128mL ISOVUE-300 IOPAMIDOL (ISOVUE-300) INJECTION 61% COMPARISON:  CT from 05/01/2015 FINDINGS: Lower chest: No acute abnormality. Hepatobiliary: Mild hepatic steatosis. No biliary dilatation. Unremarkable gallbladder. No space-occupying mass of the liver. Pancreas: Unremarkable. No pancreatic ductal dilatation or surrounding inflammatory changes. Spleen: Normal in size without focal abnormality. Adrenals/Urinary Tract: Adrenal glands are unremarkable. Kidneys are normal, without renal calculi, focal lesion, or hydronephrosis. Bladder is unremarkable. Stomach/Bowel: Fluid-filled mild distention of the stomach. There is a small sliding-type hiatal hernia. A duodenal diverticulum is identified off second portion. Dilated fluid-filled loops of jejunum identified in the mid abdomen extending to a transition point in the mid pelvis, series 2, image 57. Proximal to this, there is fecalized material within distended small bowel measuring up to 3.6 cm. Rather long segmental luminal narrowing is identified with mild transmural thickening and a transition point. Some of this may be simply due to under distention of bowel. Other differential possibilities might include changes from inflammatory bowel disease or potentially an infiltrating abnormalities such as lymphoma though believed less likely given lack of other ancillary findings nor adenopathy. Postoperative adhesion is favored given the presence of adjacent chain sutures from prior partial  colectomy/sigmoid resection within the pelvis. Vascular/Lymphatic: Aortoiliac atherosclerosis.  No lymphadenopathy. Reproductive: Status post hysterectomy. No adnexal masses. Other: No abdominal wall hernia or abnormality. No abdominopelvic ascites. Musculoskeletal: Levoconvex curvature of the lumbar spine. 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 Grade 1 anterolisthesis of L4 on L5 secondary to facet arthropathy. IMPRESSION: 1. There is small bowel obstruction believed secondary to an adhesion in the mid pelvis given adjacent changes from prior partial colectomy. Obstruction secondary to changes from inflammatory bowel or an infiltrative neoplasm are believed less likely. 2. Mild hepatic steatosis. 3. Hysterectomy.  No adnexal mass. 4. Aortoiliac atherosclerosis. Electronically Signed   By: Ashley Royalty M.D.   On: 05/09/2016 21:50    Procedures Procedures (including critical care time)  Medications Ordered in ED Medications  iopamidol (ISOVUE-300) 61 % injection (not administered)  sodium chloride 0.9 % bolus 1,000 mL (1,000 mLs Intravenous New Bag/Given 05/09/16 2027)  diltiazem (CARDIZEM) injection 10 mg (10 mg Intravenous Given 05/09/16 2033)  iopamidol (ISOVUE-300) 61 % injection 100 mL (100 mLs Intravenous Contrast Given 05/09/16 2117)     Initial Impression / Assessment and Plan / ED Course  I have reviewed the triage vital signs and the nursing notes.  Pertinent labs & imaging results that were available during my care of the patient were reviewed by me and considered in my medical decision making (see chart for details).      Final Clinical Impressions(s) / ED Diagnoses   Final diagnoses:  Encounter for imaging study to confirm nasogastric (NG) tube placement  Small bowel obstruction  SBO (small bowel obstruction)    New Prescriptions New Prescriptions   No medications on file     Virgel Manifold, MD 05/21/16 1347

## 2016-05-09 NOTE — Patient Instructions (Signed)
Please go to ER now as it appears you may have a recurrent bowel obstruction.,

## 2016-05-09 NOTE — Progress Notes (Signed)
Subjective:    Patient ID: Elizabeth Mcconnell, female    DOB: 1945/12/24, 71 y.o.   MRN: [id number]  HPI here to f/u with acute onset since last PM GI symptoms of persistent n/v and lower mid chest burning (?reflux like) but with also severe "terrific" mid abd pain and soreness not all explained by the n/v it seems,  last BM yesterday but not passing gas since then; denies fever but has had several chills.  On Eliquis - no overt bleeding from any source.  Has hx of bowel obstruction s/p partial colectomy per pt, has done well since then.  Has not been able to take any po today, no shaky and weak, "I just feel awful."  Denies urinary symptoms such as dysuria, frequency, urgency, flank pain, hematuria or n/v, fever, chills.  Pt denies chest pain, increased sob or doe, wheezing, orthopnea, PND, increased LE swelling, dizziness or syncope, though felt irreg palpiations lying on her left side last PM - has hx of afib .  Also hx of PNA per pt treated with prednisone/doxy but no diarrhea. No rash, joint symptoms, no sick contacts.  Pt was hoping to avoid the ER but came here instead Past Medical History:  Diagnosis Date  . Adenomatous polyp of colon 10/2013   f/u colo 10/2018  . Allergic rhinitis   . ALLERGIC RHINITIS   . ANXIETY    "situational" (02/14/2015)  . Arthritis    "neck, back, hands" (02/14/2015)  . Asthma   . Atrial fibrillation with RVR ([place])   . Atrial flutter ([place])    failed cardioversion 11/2012; s/p ablation 02-02-2013 by Dr Rayann Heman  . Chronic bronchitis ([place])    "I'll have it most years" (02/14/2015)  . Diverticulosis   . Endometriosis   . Essential hypertension 05/30/2014  . Oral candidiasis   . Osteopenia 08/2015   T score -1.7 FRAX 14%/4.4% overall stable from prior DEXA.  [name] Pneumothorax on left 1996   left lower lung collapse s/p resection  . Tobacco abuse    Past Surgical History:  Procedure Laterality Date  . ABDOMINAL HYSTERECTOMY    . APPENDECTOMY  1978  . ATRIAL  FLUTTER ABLATION N/A 02/02/2013   Procedure: ATRIAL FLUTTER ABLATION;  Surgeon: Coralyn Mark, MD;  Location: [facility];  Service: Cardiovascular;  Laterality: N/A;  . CARDIOVERSION N/A 11/27/2012   Procedure: CARDIOVERSION;  Surgeon: Thayer Headings, MD;  Location: [facility];  Service: Cardiovascular;  Laterality: N/A;  . [place]; 1978  . LAPAROSCOPIC PARTIAL COLECTOMY N/A 05/04/2015   Procedure: LAPAROSCOPIC LOW ANTERIOR RESECTION LAPAROSCOPIC LYSIS OF ADHESIONS, SPLENIC FLEXURE MOBILIZATION;  Surgeon: Michael Boston, MD;  [facility];  Service: General;  Laterality: N/A;  . LUNG REMOVAL, PARTIAL Left 02/1965   LLL  . TOTAL ABDOMINAL HYSTERECTOMY W/ BILATERAL SALPINGOOPHORECTOMY  1984   Endometriosis    reports that she has quit smoking. Her smoking use included Cigarettes. She has a 10.75 pack-year smoking history. She has never used smokeless tobacco. She reports that she drinks about 8.4 oz of alcohol per week . She reports that she does not use drugs. family history includes Asthma in her child and maternal grandfather; Diabetes in her mother; Heart disease in her father; Hypertension in her father; Stroke (age of onset: 59) in her father. Allergies  Allergen Reactions  . Tramadol Other (See Comments)    Severe dizziness, weakness  . Amoxicillin-Pot Clavulanate Nausea And Vomiting  . Flagyl [Metronidazole] Nausea And Vomiting  .  Flecainide     dizziness  . Omnicef [Cefdinir] Nausea And Vomiting  . Avelox [Moxifloxacin] Hives, Itching and Rash   Current Outpatient Prescriptions on File Prior to Visit  Medication Sig Dispense Refill  . acetaminophen (TYLENOL) 650 MG CR tablet Take 1,300 mg by mouth at bedtime. Second dose during day if needed for pain    . ALPRAZolam (XANAX) 0.25 MG tablet TAKE 1 TABLET BY MOUTH 2 TIMES DAILY AS NEEDED FOR anxiety    . cyanocobalamin 2000 MCG tablet Take 1 tablet (2,000 mcg total) by mouth daily. 90 tablet 3  .  dextromethorphan-guaiFENesin (MUCINEX DM) 30-600 MG 12hr tablet Take 1 tablet by mouth at bedtime.    [name] diltiazem (CARDIZEM CD) 300 MG 24 hr capsule TAKE 1 CAPSULE BY MOUTH NIGHTLY AT BEDTIME 30 capsule 11  . diltiazem (CARDIZEM) 30 MG tablet Cardizem 30mg  -- take 1 tablet by mouth every 4 hours AS NEEDED for heart rate >100 as long as blood pressure >100. 45 tablet 2  . dofetilide (TIKOSYN) 250 MCG capsule Take 1 capsule (250 mcg total) by mouth 2 (two) times daily. 14 capsule 0  . doxycycline (VIBRA-TABS) 100 MG tablet Take 1 tablet (100 mg total) by mouth 2 (two) times daily. 14 tablet 0  . ELIQUIS 5 MG TABS tablet TAKE 1 TABLET BY MOUTH 2 TIMES DAILY 180 tablet 2  . estradiol (VIVELLE-DOT) 0.05 MG/24HR patch Place 1 patch (0.05 mg total) onto the skin 2 (two) times a week. 8 patch 12  . fluticasone (FLONASE) 50 MCG/ACT nasal spray USE 2 SPRAYS IN EACH NOSTRIL 2 TIMES DAILY 16 g 2  . fluticasone (FLOVENT HFA) 110 MCG/ACT inhaler Inhale 2 puffs into the lungs 2 (two) times daily. 1 Inhaler 12  . HYDROcodone-homatropine (HYDROMET) 5-1.5 MG/5ML syrup Take 5 mLs by mouth every 6 (six) hours as needed for cough. 240 mL 0  . levalbuterol (XOPENEX HFA) 45 MCG/ACT inhaler Inhale 1-2 puffs into the lungs every 4 (four) hours as needed for wheezing or shortness of breath.    . magnesium oxide (MAG-OX) 400 MG tablet Take 1 tablet (400 mg total) by mouth daily. 30 tablet 6  . Melatonin 3 MG TABS Take 1 tablet by mouth at bedtime as needed. For sleep    . montelukast (SINGULAIR) 10 MG tablet Take 1 tablet (10 mg total) by mouth daily. 30 tablet 5  . predniSONE (DELTASONE) 10 MG tablet Take  4 each am x 2 days,   2 each am x 2 days,  1 each am x 2 days and stop (Patient not taking: Reported on 04/08/2016) 14 tablet 0  . predniSONE (DELTASONE) 10 MG tablet Take 4 tabs by mouth once daily x4 days, then 3 tabs x4 days, 2 tabs x4 days, 1 tab x4 days and stop. 40 tablet 0  . Respiratory Therapy Supplies (FLUTTER) DEVI  1 Device by Does not apply route daily. 1 each 0   No current facility-administered medications on file prior to visit.     Review of Systems  Constitutional: Negative for unusual diaphoresis or night sweats HENT: Negative for ear swelling or discharge Eyes: Negative for worsening visual haziness  Respiratory: Negative for choking and stridor.   Gastrointestinal: +++ for mild distension , no worsening eructation Genitourinary: Negative for retention or change in urine volume.  Musculoskeletal: Negative for other MSK pain or swelling Skin: Negative for color change and worsening wound Neurological: Negative for tremors and numbness other than noted  Psychiatric/Behavioral: Negative for  decreased concentration or agitation other than above   All other     Objective:   Physical Exam BP (!) 142/90 (BP Location: Left Arm, Patient Position: Sitting, Cuff Size: Normal)   Pulse 60   Temp 98.2 F (36.8 C) (Oral)   Ht 5\' 5"  (1.651 m)   Wt 157 lb (71.2 kg)   SpO2 99%   BMI 26.13 kg/m  VS noted, mild to mod ill appearing, weak shaky but able to be up on exam table without assist, appears at least mild nauseas and with abd pain Constitutional: Pt appears in no apparent distress HENT: Head: NCAT.  Right Ear: External ear normal.  Left Ear: External ear normal.  Eyes: . Pupils are equal, round, and reactive to light. Conjunctivae and EOM are normal Neck: Normal range of motion. Neck supple.  Cardiovascular: Normal rate but with irregular rhythm.   Pulmonary/Chest: Effort normal and breath sounds without rales or wheezing.  Abd:  Soft, prob mild distended, diffuse tenderness to mid left and right abd without guarding or rebound, suspect reduced  BS Neurological: Pt is alert. Not confused , motor grossly intact Skin: Skin is warm. No rash, no LE edema Psychiatric: Pt behavior is normal. No agitation.  No other exam findings    Assessment & Plan:

## 2016-05-10 ENCOUNTER — Inpatient Hospital Stay (HOSPITAL_COMMUNITY): Payer: Medicare Other

## 2016-05-10 ENCOUNTER — Encounter (HOSPITAL_COMMUNITY): Payer: Self-pay

## 2016-05-10 ENCOUNTER — Telehealth: Payer: Self-pay | Admitting: Internal Medicine

## 2016-05-10 LAB — TROPONIN I
Troponin I: <0.03 ng/mL (ref <0.03)
Troponin I: <0.03 ng/mL (ref <0.03)
Troponin I: <0.03 ng/mL (ref <0.03)

## 2016-05-10 LAB — BASIC METABOLIC PANEL
Anion gap: 6 (ref 5–15)
BUN: 13 mg/dL (ref 6–20)
CO2: 24 mmol/L (ref 22–32)
Calcium: 8.4 mg/dL — ABNORMAL LOW (ref 8.9–10.3)
Chloride: 111 mmol/L (ref 101–111)
Creatinine, Ser: 0.78 mg/dL (ref 0.44–1.00)
GFR calc Af Amer: >60 mL/min (ref >60)
GFR calc non Af Amer: >60 mL/min (ref >60)
Glucose, Bld: 105 mg/dL — ABNORMAL HIGH (ref 65–99)
Potassium: 3.8 mmol/L (ref 3.5–5.1)
Sodium: 141 mmol/L (ref 135–145)

## 2016-05-10 LAB — CBC
HCT: 41.1 % (ref 36.0–46.0)
Hemoglobin: 13.3 g/dL (ref 12.0–15.0)
MCH: 29.2 pg (ref 26.0–34.0)
MCHC: 32.4 g/dL (ref 30.0–36.0)
MCV: 90.3 fL (ref 78.0–100.0)
Platelets: 212 10*3/uL (ref 150–400)
RBC: 4.55 MIL/uL (ref 3.87–5.11)
RDW: 14.5 % (ref 11.5–15.5)
WBC: 8.6 10*3/uL (ref 4.0–10.5)

## 2016-05-10 LAB — APTT
aPTT: 22 seconds — ABNORMAL LOW (ref 24–36)
aPTT: 32 seconds (ref 24–36)

## 2016-05-10 LAB — HEPARIN LEVEL (UNFRACTIONATED): Heparin Unfractionated: >2.2 IU/mL — ABNORMAL HIGH (ref 0.30–0.70)

## 2016-05-10 LAB — TYPE AND SCREEN
ABO/RH(D): A POS
Antibody Screen: NEGATIVE

## 2016-05-10 LAB — TSH: TSH: 3.249 u[IU]/mL (ref 0.350–4.500)

## 2016-05-10 LAB — CBG MONITORING, ED: Glucose-Capillary: 121 mg/dL — ABNORMAL HIGH (ref 65–99)

## 2016-05-10 LAB — PROTIME-INR
INR: 1.06
Prothrombin Time: 13.9 seconds (ref 11.4–15.2)

## 2016-05-10 MED ORDER — HEPARIN (PORCINE) IN NACL 100-0.45 UNIT/ML-% IJ SOLN
1200.0000 [IU]/h | INTRAMUSCULAR | Status: AC
  Administered 2016-05-11 – 2016-05-12 (×2): 1300 [IU]/h via INTRAVENOUS
  Administered 2016-05-13: 1200 [IU]/h via INTRAVENOUS
  Filled 2016-05-10 (×3): qty 250

## 2016-05-10 MED ORDER — DIATRIZOATE MEGLUMINE & SODIUM 66-10 % PO SOLN
90.0000 mL | Freq: Once | ORAL | Status: AC
  Administered 2016-05-10: 90 mL via NASOGASTRIC
  Filled 2016-05-10: qty 90

## 2016-05-10 MED ORDER — DILTIAZEM HCL ER COATED BEADS 180 MG PO CP24
300.0000 mg | ORAL_CAPSULE | Freq: Every day | ORAL | Status: DC
  Administered 2016-05-10 – 2016-05-12 (×3): 300 mg via ORAL
  Filled 2016-05-10 (×3): qty 1

## 2016-05-10 MED ORDER — BUDESONIDE 0.25 MG/2ML IN SUSP
0.2500 mg | Freq: Two times a day (BID) | RESPIRATORY_TRACT | Status: DC
  Administered 2016-05-10 – 2016-05-13 (×7): 0.25 mg via RESPIRATORY_TRACT
  Filled 2016-05-10 (×8): qty 2

## 2016-05-10 MED ORDER — SODIUM CHLORIDE 0.9 % IV SOLN
30.0000 meq | Freq: Once | INTRAVENOUS | Status: AC
  Administered 2016-05-10: 30 meq via INTRAVENOUS
  Filled 2016-05-10: qty 15

## 2016-05-10 MED ORDER — DILTIAZEM HCL-DEXTROSE 100-5 MG/100ML-% IV SOLN (PREMIX)
5.0000 mg/h | INTRAVENOUS | Status: DC
  Administered 2016-05-10: 5 mg/h via INTRAVENOUS
  Filled 2016-05-10: qty 100

## 2016-05-10 MED ORDER — HEPARIN (PORCINE) IN NACL 100-0.45 UNIT/ML-% IJ SOLN
1000.0000 [IU]/h | INTRAMUSCULAR | Status: DC
  Administered 2016-05-10: 1000 [IU]/h via INTRAVENOUS
  Filled 2016-05-10: qty 250

## 2016-05-10 MED ORDER — BUDESONIDE 0.25 MG/2ML IN SUSP: 0.2500 mg | Freq: Two times a day (BID) | RESPIRATORY_TRACT | Status: DC

## 2016-05-10 NOTE — Telephone Encounter (Signed)
I called pt and spoke with her re her SBO/NG tube and tikosyn. I called and spoke to Dr. Rayann Heman and he said it was very reasonable to stop drug in this situation. Drug should be restarted on telemetry when she is able to restart for 2 days prior to discharge. Encouraged pt to get current providers to call Dr. Rayann Heman if further questions re tikosyn.

## 2016-05-10 NOTE — Progress Notes (Signed)
PROGRESS NOTE  Elizabeth Mcconnell FTD:[id number] DOB: 1945-03-24 DOA: 05/09/2016 PCP: [name], MD   LOS: 1 day   Brief Narrative: Elizabeth Mcconnell is a 71 y.o. female with medical history significant of hypertension, asthma, tobacco abuse, left pneumothorax, atrial fibrillation on Eliquis, partial colectomy due to bowel obstruction 05/04/15, s/p of LLL lung lobectomy, who presents with nausea, vomiting and abdominal pain, found to have recurrent small bowel obstruction on the CT scan.  General surgery was consulted, and due to multiple medical problems she was admitted on the hospitalist service  Assessment & Plan: Principal Problem:   SBO (small bowel obstruction) Active Problems:   GAD (generalized anxiety disorder)   Asthma, moderate persistent   Essential hypertension   Atrial fibrillation with RVR (HCC)   Tobacco abuse   SBO (small bowel obstruction)  -Patient underwent a CT scan of the abdomen and pelvis in the emergency room which showed small bowel obstruction with transition point in mid pelvis -General surgery consulted, appreciate input, looks like we will do conservative management for now with NG tube, n.p.o., IV fluids and continue to hold anticoagulation in case she needs surgery  Atrial fibrillation with RVR  -CHA2DS2-VASc Score is 3, needs oral anticoagulation.  She is on Eliquis at home, continue to hold given potential for surgery.  She will need to be started on heparin infusion if she is not going to have surgery within the next 24 hours -Patient started on Cardizem infusion due to RVR, on my evaluation this morning her heart rate was in the 80s off of Cardizem.  I agree with stepdown monitoring at least for now   Asthma, moderate persistent  -stable, no wheezing -prn Xopenex nebs -continue flovent inhaler and singular  HTN -on Cardizem -prn IV hydralazine  GAD (generalized anxiety disorder) -continue home Xanax-->if not able to tolerate oral meds,  will switch to prn ativan  Tobacco abuse -Did counseling about importance of quitting smoking -Nicotine patch  Positive UA -UA has small amount of leukocyte, but patient does not have symptoms of UTI. -f/u urine culture  DVT prophylaxis: Eliquis Code Status: Full code Family Communication: No family at bedside Disposition Plan: Admit to stepdown  Consultants:   General surgery  Procedures:   None   Antimicrobials:  None    Subjective: - no chest pain, shortness of breath, has been complaining of intermittent abdominal pain relieved with pain medications, and intermittent nausea relieved with antiemetics  Objective: Vitals:   05/10/16 0732 05/10/16 0808 05/10/16 1000 05/10/16 1100  BP:  131/62 141/76 144/83  Pulse:  89 84 90  Resp:  17 19 14   Temp:      TempSrc:      SpO2: 94% 92% 94% (!) 88%    Intake/Output Summary (Last 24 hours) at 05/10/16 1128 Last data filed at 05/09/16 2344  Gross per 24 hour  Intake             1000 ml  Output                0 ml  Net             1000 ml   There were no vitals filed for this visit.  Examination: Constitutional: NAD Vitals:   05/10/16 0732 05/10/16 0808 05/10/16 1000 05/10/16 1100  BP:  131/62 141/76 144/83  Pulse:  89 84 90  Resp:  17 19 14   Temp:      TempSrc:      SpO2:  94% 92% 94% (!) 88%   Eyes: PERRL, lids and conjunctivae normal ENMT: Mucous membranes are moist. No oropharyngeal exudates Neck: normal, supple, no masses, no thyromegaly Respiratory: clear to auscultation bilaterally, no wheezing, no crackles. Normal respiratory effort. No accessory muscle use.  Cardiovascular: Regular rate and rhythm, no murmurs / rubs / gallops. No LE edema. 2+ pedal pulses.  Abdomen: no tenderness. Bowel sounds positive.    Data Reviewed: I have personally reviewed following labs and imaging studies  CBC:  Recent Labs Lab 05/09/16 2006 05/10/16 0624  WBC 11.4* 8.6  HGB 16.1* 13.3  HCT 48.2* 41.1  MCV 89.1  90.3  PLT 247 481   Basic Metabolic Panel:  Recent Labs Lab 05/09/16 2006 05/10/16 0624  NA 142 141  K 3.8 3.8  CL 106 111  CO2 25 24  GLUCOSE 120* 105*  BUN 14 13  CREATININE 0.93 0.78  CALCIUM 9.6 8.4*   GFR: Estimated Creatinine Clearance: 62.9 mL/min (by C-G formula based on SCr of 0.78 mg/dL). Liver Function Tests:  Recent Labs Lab 05/09/16 2006  AST 18  ALT 17  ALKPHOS 73  BILITOT 1.1  PROT 7.8  ALBUMIN 4.8    Recent Labs Lab 05/09/16 2006  LIPASE 36   No results for input(s): AMMONIA in the last 168 hours. Coagulation Profile:  Recent Labs Lab 05/09/16 2352  INR 1.06   Cardiac Enzymes:  Recent Labs Lab 05/09/16 2352 05/10/16 0624  TROPONINI <0.03 <0.03   BNP (last 3 results) No results for input(s): PROBNP in the last 8760 hours. HbA1C: No results for input(s): HGBA1C in the last 72 hours. CBG:  Recent Labs Lab 05/10/16 0813  GLUCAP 121*   Lipid Profile: No results for input(s): CHOL, HDL, LDLCALC, TRIG, CHOLHDL, LDLDIRECT in the last 72 hours. Thyroid Function Tests:  Recent Labs  05/10/16 0624  TSH 3.249   Anemia Panel: No results for input(s): VITAMINB12, FOLATE, FERRITIN, TIBC, IRON, RETICCTPCT in the last 72 hours. Urine analysis:    Component Value Date/Time   COLORURINE AMBER (A) 05/09/2016 1941   APPEARANCEUR HAZY (A) 05/09/2016 1941   LABSPEC 1.025 05/09/2016 1941   PHURINE 7.0 05/09/2016 1941   GLUCOSEU 50 (A) 05/09/2016 1941   GLUCOSEU NEGATIVE 10/21/2014 1019   HGBUR NEGATIVE 05/09/2016 1941   BILIRUBINUR SMALL (A) 05/09/2016 1941   KETONESUR 20 (A) 05/09/2016 1941   PROTEINUR 100 (A) 05/09/2016 1941   UROBILINOGEN 1.0 10/21/2014 1019   NITRITE NEGATIVE 05/09/2016 1941   LEUKOCYTESUR SMALL (A) 05/09/2016 1941   Sepsis Labs: Invalid input(s): PROCALCITONIN, LACTICIDVEN  No results found for this or any previous visit (from the past 240 hour(s)).    Radiology Studies: Ct Abdomen Pelvis W  Contrast  Result Date: 05/09/2016 CLINICAL DATA:  Intermittent burning abdominal pain since last evening. History of atrial fibrillation and flutter, diverticulosis, GERD and asthma. EXAM: CT ABDOMEN AND PELVIS WITH CONTRAST TECHNIQUE: Multidetector CT imaging of the abdomen and pelvis was performed using the standard protocol following bolus administration of intravenous contrast. CONTRAST:  180mL ISOVUE-300 IOPAMIDOL (ISOVUE-300) INJECTION 61% COMPARISON:  CT from 05/01/2015 FINDINGS: Lower chest: No acute abnormality. Hepatobiliary: Mild hepatic steatosis. No biliary dilatation. Unremarkable gallbladder. No space-occupying mass of the liver. Pancreas: Unremarkable. No pancreatic ductal dilatation or surrounding inflammatory changes. Spleen: Normal in size without focal abnormality. Adrenals/Urinary Tract: Adrenal glands are unremarkable. Kidneys are normal, without renal calculi, focal lesion, or hydronephrosis. Bladder is unremarkable. Stomach/Bowel: Fluid-filled mild distention of the stomach. There is a small  sliding-type hiatal hernia. A duodenal diverticulum is identified off second portion. Dilated fluid-filled loops of jejunum identified in the mid abdomen extending to a transition point in the mid pelvis, series 2, image 57. Proximal to this, there is fecalized material within distended small bowel measuring up to 3.6 cm. Rather long segmental luminal narrowing is identified with mild transmural thickening and a transition point. Some of this may be simply due to under distention of bowel. Other differential possibilities might include changes from inflammatory bowel disease or potentially an infiltrating abnormalities such as lymphoma though believed less likely given lack of other ancillary findings nor adenopathy. Postoperative adhesion is favored given the presence of adjacent chain sutures from prior partial colectomy/sigmoid resection within the pelvis. Vascular/Lymphatic: Aortoiliac  atherosclerosis.  No lymphadenopathy. Reproductive: Status post hysterectomy. No adnexal masses. Other: No abdominal wall hernia or abnormality. No abdominopelvic ascites. Musculoskeletal: Levoconvex curvature of the lumbar spine. 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 Grade 1 anterolisthesis of L4 on L5 secondary to facet arthropathy. IMPRESSION: 1. There is small bowel obstruction believed secondary to an adhesion in the mid pelvis given adjacent changes from prior partial colectomy. Obstruction secondary to changes from inflammatory bowel or an infiltrative neoplasm are believed less likely. 2. Mild hepatic steatosis. 3. Hysterectomy.  No adnexal mass. 4. Aortoiliac atherosclerosis. Electronically Signed   By: Ashley Royalty M.D.   On: 05/09/2016 21:50   Dg Abd Portable 1v-small Bowel Protocol-position Verification  Result Date: 05/10/2016 CLINICAL DATA:  Evaluate NG tube placement. Small bowel obstruction. EXAM: PORTABLE ABDOMEN - 1 VIEW COMPARISON:  May 09, 2016 FINDINGS: The NG tube is appropriately located in the left upper quadrant. Dilated small bowel loops in the left central abdomen are again identified. IMPRESSION: The NG tube appears to be in good position. Electronically Signed   By: Dorise Bullion III M.D   On: 05/10/2016 10:36     Scheduled Meds: . budesonide (PULMICORT) nebulizer solution  0.25 mg Nebulization BID  . diatrizoate meglumine-sodium  90 mL Per NG tube Once  . diltiazem  300 mg Oral QHS  . dofetilide  250 mcg Oral BID  . [START ON 05/13/2016] estradiol  0.05 mg Transdermal Weekly  . fluticasone  1 spray Each Nare Daily  . levalbuterol  1.25 mg Nebulization TID  . magnesium oxide  400 mg Oral QHS  . montelukast  10 mg Oral Daily  . nicotine  21 mg Transdermal Daily  . sodium chloride flush  3 mL Intravenous Q12H  . cyanocobalamin  2,000 mcg Oral Daily   Continuous Infusions: . sodium chloride 100 mL/hr at 05/09/16 2345  . diltiazem (CARDIZEM) infusion Stopped (05/10/16 0101)      Marzetta Board, MD, PhD [facility] Pager [phone]  If 7PM-7AM, please contact night-coverage [email or web address] Password [email or web address] 05/10/2016, 11:28 AM

## 2016-05-10 NOTE — Telephone Encounter (Signed)
New message    Pt is calling stating she was admitted to hospital for another reason.  Pt c/o medication issue:  1. Name of Medication: tikosyn 250 mcg  2. How are you currently taking this medication (dosage and times per day)? 1 cap by mouth two times daily  3. Are you having a reaction (difficulty breathing--STAT)? no  4. What is your medication issue? Pt states that the hospital wants to stop her medication because she may have surgery next week. Pt is concerned about having to stop taking this medication.

## 2016-05-10 NOTE — ED Notes (Signed)
[id number].

## 2016-05-10 NOTE — Consult Note (Signed)
[facility] Consult Note  Elizabeth Mcconnell 1945-07-06  [id number].    Requesting MD: Blaine Hamper Chief Complaint/Reason for Consult: SBO  HPI:  Elizabeth Mcconnell is a 71yo female PMH prior SBO treated with partial colectomy 05/04/15 by Dr. Johney Maine, who presented to [facility] late last night with 2 days of gradually worsening abdominal pain. States that the pain is around her umbilicus and lower in her abdomen. It is constant and gradually getting worse. Pain is associated with nausea, vomiting, chills, and abdominal distension.  Last BM was 2 days ago. States that she has not passed any flatus since that time either. Last meal was Wednesday. Denies fever, diarrhea, or dysuria.   [facility] workup: - CT scan shows small bowel obstruction believed secondary to an adhesion in the mid pelvis given adjacent changes from prior partial colectomy - WBC 11.4, afebrile  PMH significant for Atrial fibrillation, (on Tikosyn and Eliquis), HTN, Asthma Abdominal surgical history includes c. Section x2, appendectomy, hysterectomy, partial colectomy 05/04/2015 by Dr. Johney Maine Anticoagulants: eliquis Smokes cigarettes occassionally Lives at home with her husband  ROS: Review of Systems  Constitutional: Positive for chills. Negative for fever.  HENT: Negative.   Eyes: Negative.   Respiratory: Negative.   Cardiovascular: Negative.   Gastrointestinal: Positive for abdominal pain, constipation, nausea and vomiting. Negative for blood in stool, diarrhea, heartburn and melena.  Genitourinary: Negative.   Musculoskeletal: Positive for joint pain.       Bilateral shoulder pain  Skin: Negative.   Neurological: Negative.   All systems reviewed and otherwise negative except for as above  Family History  Problem Relation Age of Onset  . Asthma Maternal Grandfather   . Stroke Father 41  . Hypertension Father   . Heart disease Father   . Diabetes Mother   . Asthma Child   . Colon cancer Neg Hx   . Rectal  cancer Neg Hx   . Stomach cancer Neg Hx     Past Medical History:  Diagnosis Date  . Adenomatous polyp of colon 10/2013   f/u colo 10/2018  . Allergic rhinitis   . ALLERGIC RHINITIS   . ANXIETY    "situational" (02/14/2015)  . Arthritis    "neck, back, hands" (02/14/2015)  . Asthma   . Atrial fibrillation with RVR ([place])   . Atrial flutter ([place])    failed cardioversion 11/2012; s/p ablation 02-02-2013 by Dr Rayann Heman  . Chronic bronchitis ([place])    "I'll have it most years" (02/14/2015)  . Diverticulosis   . Endometriosis   . Essential hypertension 05/30/2014  . Oral candidiasis   . Osteopenia 08/2015   T score -1.7 FRAX 14%/4.4% overall stable from prior DEXA.  [name] Pneumothorax on left 1996   left lower lung collapse s/p resection  . Tobacco abuse     Past Surgical History:  Procedure Laterality Date  . ABDOMINAL HYSTERECTOMY    . APPENDECTOMY  1978  . ATRIAL FLUTTER ABLATION N/A 02/02/2013   Procedure: ATRIAL FLUTTER ABLATION;  Surgeon: Coralyn Mark, MD;  Location: [facility];  Service: Cardiovascular;  Laterality: N/A;  . CARDIOVERSION N/A 11/27/2012   Procedure: CARDIOVERSION;  Surgeon: Thayer Headings, MD;  Location: [facility];  Service: Cardiovascular;  Laterality: N/A;  . [place]; 1978  . LAPAROSCOPIC PARTIAL COLECTOMY N/A 05/04/2015   Procedure: LAPAROSCOPIC LOW ANTERIOR RESECTION LAPAROSCOPIC LYSIS OF ADHESIONS, SPLENIC FLEXURE MOBILIZATION;  Surgeon: Michael Boston, MD;  Location: [facility];  Service: General;  Laterality: N/A;  .  LUNG REMOVAL, PARTIAL Left 02/1965   LLL  . TOTAL ABDOMINAL HYSTERECTOMY W/ BILATERAL SALPINGOOPHORECTOMY  1984   Endometriosis    Social History:  reports that she has quit smoking. Her smoking use included Cigarettes. She has a 10.75 pack-year smoking history. She has never used smokeless tobacco. She reports that she drinks about 8.4 oz of alcohol per week . She reports that she does not use drugs.  Allergies:  Allergies   Allergen Reactions  . Tramadol Other (See Comments)    Reaction:  Dizziness and weakness   . Amoxicillin-Pot Clavulanate Nausea And Vomiting and Other (See Comments)    Has patient had a PCN reaction causing immediate rash, facial/tongue/throat swelling, SOB or lightheadedness with hypotension: No Has patient had a PCN reaction causing severe rash involving mucus membranes or skin necrosis: No Has patient had a PCN reaction that required hospitalization No Has patient had a PCN reaction occurring within the last 10 years: No If all of the above answers are "NO", then may proceed with Cephalosporin use.  . Flagyl [Metronidazole] Nausea And Vomiting  . Flecainide Other (See Comments)    Reaction:  Dizziness   . Omnicef [Cefdinir] Nausea And Vomiting  . Avelox [Moxifloxacin] Hives and Itching     (Not in a [facility] admission)  Prior to Admission medications   Medication Sig Start Date End Date Taking? Authorizing Provider  acetaminophen (TYLENOL) 650 MG CR tablet Take 650-1,300 mg by mouth at bedtime as needed for pain.    Yes Historical Provider, MD  ALPRAZolam (XANAX) 0.25 MG tablet Take 0.25 mg by mouth 2 (two) times daily as needed for anxiety.    Yes Historical Provider, MD  cyanocobalamin 2000 MCG tablet Take 1 tablet (2,000 mcg total) by mouth daily. 06/05/15  Yes Janith Lima, MD  diltiazem (CARDIZEM CD) 300 MG 24 hr capsule TAKE 1 CAPSULE BY MOUTH NIGHTLY AT BEDTIME Patient taking differently: Take 300 mg by mouth at bedtime.  04/10/16  Yes Sherren Mocha, MD  diltiazem (CARDIZEM) 30 MG tablet Cardizem 73m -- take 1 tablet by mouth every 4 hours AS NEEDED for heart rate >100 as long as blood pressure >100. Patient taking differently: Take 30 mg by mouth every 4 (four) hours as needed (for heart rate > 100 as long as BP is >100).  11/13/15  Yes DSherran Needs NP  dofetilide (TIKOSYN) 250 MCG capsule Take 1 capsule (250 mcg total) by mouth 2 (two) times daily. 02/17/15  Yes Amber KSena Slate NP  ELIQUIS 5 MG TABS tablet TAKE 1 TABLET BY MOUTH 2 TIMES DAILY 10/04/15  Yes MSherren Mocha MD  estradiol (VIVELLE-DOT) 0.05 MG/24HR patch Place 1 patch (0.05 mg total) onto the skin 2 (two) times a week. 08/07/15  Yes TAnastasio Auerbach MD  fluticasone (FLONASE) 50 MCG/ACT nasal spray USE 2 SPRAYS IN EACH NOSTRIL 2 TIMES DAILY 04/30/16  Yes JJavier Glazier MD  fluticasone (FLOVENT HFA) 110 MCG/ACT inhaler Inhale 2 puffs into the lungs 2 (two) times daily. 11/28/15  Yes TJanith Lima MD  levalbuterol ([facility] SystemHFA) 45 MCG/ACT inhaler Inhale 1-2 puffs into the lungs every 4 (four) hours as needed for wheezing or shortness of breath.   Yes Historical Provider, MD  magnesium oxide (MAG-OX) 400 MG tablet Take 1 tablet (400 mg total) by mouth daily. Patient taking differently: Take 400 mg by mouth at bedtime.  02/13/15  Yes MSherren Mocha MD  Melatonin 3 MG TABS Take 3 mg by mouth  at bedtime as needed (for sleep).    Yes Historical Provider, MD  montelukast (SINGULAIR) 10 MG tablet Take 1 tablet (10 mg total) by mouth daily. 03/14/16  Yes Javier Glazier, MD    Blood pressure 131/62, pulse 89, temperature 97.8 F (36.6 C), temperature source Oral, resp. rate 17, SpO2 92 %. Physical Exam: General: pleasant, WD/WN white female who is laying in bed in NAD HEENT: head is normocephalic, atraumatic.  Sclera are noninjected. Mouth is pink and moist Heart: irregularly irregular.  No obvious murmurs, gallops, or rubs noted.  Palpable pedal pulses bilaterally Lungs: CTAB, no wheezes, rhonchi, or rales noted.  Respiratory effort nonlabored Abd: soft, mild distension, +BS, no masses, hernias, or organomegaly. Mild TTP periumbilical and lower abdomen MS: all 4 extremities are symmetrical with no cyanosis, clubbing, or edema. Skin: warm and dry with no masses, lesions, or rashes Psych: A&Ox3 with an appropriate affect. Neuro: CM 2-12 intact, extremity CSM intact bilaterally, normal speech  Results  for orders placed or performed during the [facility] encounter of 05/09/16 (from the past 48 hour(s))  Urinalysis, Routine w reflex microscopic     Status: Abnormal   Collection Time: 05/09/16  7:41 PM  Result Value Ref Range   Color, Urine AMBER (A) YELLOW    Comment: BIOCHEMICALS MAY BE AFFECTED BY COLOR   APPearance HAZY (A) CLEAR   Specific Gravity, Urine 1.025 1.005 - 1.030   pH 7.0 5.0 - 8.0   Glucose, UA 50 (A) NEGATIVE mg/dL   Hgb urine dipstick NEGATIVE NEGATIVE   Bilirubin Urine SMALL (A) NEGATIVE   Ketones, ur 20 (A) NEGATIVE mg/dL   Protein, ur 100 (A) NEGATIVE mg/dL   Nitrite NEGATIVE NEGATIVE   Leukocytes, UA SMALL (A) NEGATIVE   RBC / HPF 0-5 0 - 5 RBC/hpf   WBC, UA 6-30 0 - 5 WBC/hpf   Bacteria, UA RARE (A) NONE SEEN   Squamous Epithelial / LPF 6-30 (A) NONE SEEN   Mucous PRESENT    Hyaline Casts, UA PRESENT   Lipase, blood     Status: None   Collection Time: 05/09/16  8:06 PM  Result Value Ref Range   Lipase 36 11 - 51 U/L  Comprehensive metabolic panel     Status: Abnormal   Collection Time: 05/09/16  8:06 PM  Result Value Ref Range   Sodium 142 135 - 145 mmol/L   Potassium 3.8 3.5 - 5.1 mmol/L   Chloride 106 101 - 111 mmol/L   CO2 25 22 - 32 mmol/L   Glucose, Bld 120 (H) 65 - 99 mg/dL   BUN 14 6 - 20 mg/dL   Creatinine, Ser 0.93 0.44 - 1.00 mg/dL   Calcium 9.6 8.9 - 10.3 mg/dL   Total Protein 7.8 6.5 - 8.1 g/dL   Albumin 4.8 3.5 - 5.0 g/dL   AST 18 15 - 41 U/L   ALT 17 14 - 54 U/L   Alkaline Phosphatase 73 38 - 126 U/L   Total Bilirubin 1.1 0.3 - 1.2 mg/dL   GFR calc non Af Amer 60 (L) >60 mL/min   GFR calc Af Amer >60 >60 mL/min    Comment: (NOTE) The eGFR has been calculated using the CKD EPI equation. This calculation has not been validated in all clinical situations. eGFR's persistently <60 mL/min signify possible Chronic Kidney Disease.    Anion gap 11 5 - 15  CBC     Status: Abnormal   Collection Time: 05/09/16  8:06 PM  Result Value Ref  Range   WBC 11.4 (H) 4.0 - 10.5 K/uL   RBC 5.41 (H) 3.87 - 5.11 MIL/uL   Hemoglobin 16.1 (H) 12.0 - 15.0 g/dL   HCT 48.2 (H) 36.0 - 46.0 %   MCV 89.1 78.0 - 100.0 fL   MCH 29.8 26.0 - 34.0 pg   MCHC 33.4 30.0 - 36.0 g/dL   RDW 14.3 11.5 - 15.5 %   Platelets 247 150 - 400 K/uL  Type and screen [place]     Status: None   Collection Time: 05/09/16 11:42 PM  Result Value Ref Range   ABO/RH(D) A POS    Antibody Screen NEG    Sample Expiration 05/12/2016   Protime-INR     Status: None   Collection Time: 05/09/16 11:52 PM  Result Value Ref Range   Prothrombin Time 13.9 11.4 - 15.2 seconds   INR 1.06   APTT     Status: None   Collection Time: 05/09/16 11:52 PM  Result Value Ref Range   aPTT 32 24 - 36 seconds  Troponin I (q 6hr x 3)     Status: None   Collection Time: 05/09/16 11:52 PM  Result Value Ref Range   Troponin I <0.03 <0.03 ng/mL  TSH     Status: None   Collection Time: 05/10/16  6:24 AM  Result Value Ref Range   TSH 3.249 0.350 - 4.500 uIU/mL    Comment: Performed by a 3rd Generation assay with a functional sensitivity of <=0.01 uIU/mL.  Troponin I (q 6hr x 3)     Status: None   Collection Time: 05/10/16  6:24 AM  Result Value Ref Range   Troponin I <0.03 <0.03 ng/mL  Basic metabolic panel     Status: Abnormal   Collection Time: 05/10/16  6:24 AM  Result Value Ref Range   Sodium 141 135 - 145 mmol/L   Potassium 3.8 3.5 - 5.1 mmol/L   Chloride 111 101 - 111 mmol/L   CO2 24 22 - 32 mmol/L   Glucose, Bld 105 (H) 65 - 99 mg/dL   BUN 13 6 - 20 mg/dL   Creatinine, Ser 0.78 0.44 - 1.00 mg/dL   Calcium 8.4 (L) 8.9 - 10.3 mg/dL   GFR calc non Af Amer >60 >60 mL/min   GFR calc Af Amer >60 >60 mL/min    Comment: (NOTE) The eGFR has been calculated using the CKD EPI equation. This calculation has not been validated in all clinical situations. eGFR's persistently <60 mL/min signify possible Chronic Kidney Disease.    Anion gap 6 5 - 15  CBC      Status: None   Collection Time: 05/10/16  6:24 AM  Result Value Ref Range   WBC 8.6 4.0 - 10.5 K/uL   RBC 4.55 3.87 - 5.11 MIL/uL   Hemoglobin 13.3 12.0 - 15.0 g/dL   HCT 41.1 36.0 - 46.0 %   MCV 90.3 78.0 - 100.0 fL   MCH 29.2 26.0 - 34.0 pg   MCHC 32.4 30.0 - 36.0 g/dL   RDW 14.5 11.5 - 15.5 %   Platelets 212 150 - 400 K/uL  CBG monitoring, ED     Status: Abnormal   Collection Time: 05/10/16  8:13 AM  Result Value Ref Range   Glucose-Capillary 121 (H) 65 - 99 mg/dL   Ct Abdomen Pelvis W Contrast  Result Date: 05/09/2016 CLINICAL DATA:  Intermittent burning abdominal pain since last evening. History of  atrial fibrillation and flutter, diverticulosis, GERD and asthma. EXAM: CT ABDOMEN AND PELVIS WITH CONTRAST TECHNIQUE: Multidetector CT imaging of the abdomen and pelvis was performed using the standard protocol following bolus administration of intravenous contrast. CONTRAST:  147m ISOVUE-300 IOPAMIDOL (ISOVUE-300) INJECTION 61% COMPARISON:  CT from 05/01/2015 FINDINGS: Lower chest: No acute abnormality. Hepatobiliary: Mild hepatic steatosis. No biliary dilatation. Unremarkable gallbladder. No space-occupying mass of the liver. Pancreas: Unremarkable. No pancreatic ductal dilatation or surrounding inflammatory changes. Spleen: Normal in size without focal abnormality. Adrenals/Urinary Tract: Adrenal glands are unremarkable. Kidneys are normal, without renal calculi, focal lesion, or hydronephrosis. Bladder is unremarkable. Stomach/Bowel: Fluid-filled mild distention of the stomach. There is a small sliding-type hiatal hernia. A duodenal diverticulum is identified off second portion. Dilated fluid-filled loops of jejunum identified in the mid abdomen extending to a transition point in the mid pelvis, series 2, image 57. Proximal to this, there is fecalized material within distended small bowel measuring up to 3.6 cm. Rather long segmental luminal narrowing is identified with mild transmural  thickening and a transition point. Some of this may be simply due to under distention of bowel. Other differential possibilities might include changes from inflammatory bowel disease or potentially an infiltrating abnormalities such as lymphoma though believed less likely given lack of other ancillary findings nor adenopathy. Postoperative adhesion is favored given the presence of adjacent chain sutures from prior partial colectomy/sigmoid resection within the pelvis. Vascular/Lymphatic: Aortoiliac atherosclerosis.  No lymphadenopathy. Reproductive: Status post hysterectomy. No adnexal masses. Other: No abdominal wall hernia or abnormality. No abdominopelvic ascites. Musculoskeletal: Levoconvex curvature of the lumbar spine. 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 Grade 1 anterolisthesis of L4 on L5 secondary to facet arthropathy. IMPRESSION: 1. There is small bowel obstruction believed secondary to an adhesion in the mid pelvis given adjacent changes from prior partial colectomy. Obstruction secondary to changes from inflammatory bowel or an infiltrative neoplasm are believed less likely. 2. Mild hepatic steatosis. 3. Hysterectomy.  No adnexal mass. 4. Aortoiliac atherosclerosis. Electronically Signed   By: DAshley RoyaltyM.D.   On: 05/09/2016 21:50   Anti-infectives    None        Assessment/Plan SBO 2/2 likely adhesions - abdominal surgical history includes c. Section x2, appendectomy, hysterectomy, partial colectomy 05/04/2015 by Dr. GJohney Maine- 2 days of gradually worsening abdominal pain, distension, nausea, and vomiting - CT scan shows small bowel obstruction believed secondary to an adhesion in the mid pelvis given adjacent changes from prior partial colectomy - WBC 11.4, afebrile  Atrial fibrillation - on Tikosyn and Eliquis; followed by Dr. ARayann HemanHTN Asthma Tobacco abuse - smokes cigarettes occassionally  ID - none VTE - SCDs FEN - IVF, NPO/NGT  Plan - Admit to medicine for multiple medical  problems. For SBO recommend NGT for small bowel decompression, NPO, bowel rest, IVF, pain control, and antiemetics. Will start patient on small bowel protocol. Hopefully this will resolve without surgery. Please hold Eliquis in case she needs surgery later on.  BJerrye Beavers PKindred [facility] - [facility] 05/10/2016, 8:51 AM Pager: [phone]: [phone]-Fri 7:00 am-4:30 pm Sat-Sun 7:00 am-11:30 am

## 2016-05-10 NOTE — Progress Notes (Addendum)
ANTICOAGULATION CONSULT NOTE - Initial Consult  Pharmacy Consult for Heparin Indication: atrial fibrillation  Allergies  Allergen Reactions  . Tramadol Other (See Comments)    Reaction:  Dizziness and weakness   . Amoxicillin-Pot Clavulanate Nausea And Vomiting and Other (See Comments)    Has patient had a PCN reaction causing immediate rash, facial/tongue/throat swelling, SOB or lightheadedness with hypotension: No Has patient had a PCN reaction causing severe rash involving mucus membranes or skin necrosis: No Has patient had a PCN reaction that required hospitalization No Has patient had a PCN reaction occurring within the last 10 years: No If all of the above answers are "NO", then may proceed with Cephalosporin use.  . Flagyl [Metronidazole] Nausea And Vomiting  . Flecainide Other (See Comments)    Reaction:  Dizziness   . Omnicef [Cefdinir] Nausea And Vomiting  . Avelox [Moxifloxacin] Hives and Itching    Patient Measurements:   Heparin Dosing Weight: using total body weight of 71.2 kg  Vital Signs: BP: 144/83 (03/09 1100) Pulse Rate: 90 (03/09 1100)  Labs:  Recent Labs  05/09/16 2006 05/09/16 2352 05/10/16 0624  HGB 16.1*  --  13.3  HCT 48.2*  --  41.1  PLT 247  --  212  APTT  --  32  --   LABPROT  --  13.9  --   INR  --  1.06  --   CREATININE 0.93  --  0.78  TROPONINI  --  <0.03 <0.03    Estimated Creatinine Clearance: 62.9 mL/min (by C-G formula based on SCr of 0.78 mg/dL).   Medical History: Past Medical History:  Diagnosis Date  . Adenomatous polyp of colon 10/2013   f/u colo 10/2018  . Allergic rhinitis   . ALLERGIC RHINITIS   . ANXIETY    "situational" (02/14/2015)  . Arthritis    "neck, back, hands" (02/14/2015)  . Asthma   . Atrial fibrillation with RVR ([place])   . Atrial flutter ([place])    failed cardioversion 11/2012; s/p ablation 02-02-2013 by Dr Rayann Heman  . Chronic bronchitis ([place])    "I'll have it most years" (02/14/2015)  . Diverticulosis    . Endometriosis   . Essential hypertension 05/30/2014  . Oral candidiasis   . Osteopenia 08/2015   T score -1.7 FRAX 14%/4.4% overall stable from prior DEXA.  [name] Pneumothorax on left 1996   left lower lung collapse s/p resection  . Tobacco abuse      Assessment: 71 y.o.femalewith atrial fibrillation on Eliquis PTA s/p partial colectomy due to bowel obstruction 05/04/15, s/p of LLL lung lobectomy, whopresents with nausea, vomiting and abdominal pain, found to have recurrent small bowel obstruction on the CT scan.  Surgery consulted and recommending conservative management for now but holding apixaban in case needs surgery.  CHA2DS2-VASc Scoreis 3.  Starting patient on heparin infusion in the meantime as bridge.  Per patient report, last apixaban dose 3/8 at 1000.  APTT 32 seconds last night.  Will check a baseline heparin level (anti-Xa level) since this will most likely be effected by recent apixaban use.  Today's labs:  SCr WNL, CrCl>30 ml/min Hgb, platelets WNL Heparin level >2.20  Goal of Therapy:  Heparin level 0.3-0.7 units/ml aPTT 66-102 seconds Monitor platelets by anticoagulation protocol: Yes   Plan:  Start heparin infusion at 1000 units/hr.  Do not feel need to provide bolus given recent apixaban use. Check aPTT 8 hours after infusion started.  Since HL elevated, will continue to use aPTT to  titrate heparin level until effects of apixaban have diminished.  Will check a heparin level at least daily with aPTT until levels correlate. F/u plans for potential surgery. Daily CBC while on heparin infusion.  Hershal Coria 05/10/2016,11:39 AM

## 2016-05-10 NOTE — ED Notes (Signed)
NG to low intermittent suction

## 2016-05-10 NOTE — Telephone Encounter (Signed)
Called patient back about her question. Patient stated that the doctor wants to stop her Tikosyn for a week. Patient stated last time she had surgery, that her Tikosyn did not need to be stopped so long. Patient is worried about them stopping her Tikosyn. Informed patient that if they stopped her Tikosyn they would need to restart it while she is in the hospital. Asked patient to talk to her nurse and her doctor at the hospital. Informed patient that a message would be sent to Dr. Rayann Heman and Allie Dimmer NP, so they are aware. Patient verbalized understanding.

## 2016-05-10 NOTE — Progress Notes (Signed)
[facility] for Heparin Indication: atrial fibrillation  Allergies  Allergen Reactions  . Tramadol Other (See Comments)    Reaction:  Dizziness and weakness   . Amoxicillin-Pot Clavulanate Nausea And Vomiting and Other (See Comments)    Has patient had a PCN reaction causing immediate rash, facial/tongue/throat swelling, SOB or lightheadedness with hypotension: No Has patient had a PCN reaction causing severe rash involving mucus membranes or skin necrosis: No Has patient had a PCN reaction that required hospitalization No Has patient had a PCN reaction occurring within the last 10 years: No If all of the above answers are "NO", then may proceed with Cephalosporin use.  . Flagyl [Metronidazole] Nausea And Vomiting  . Flecainide Other (See Comments)    Reaction:  Dizziness   . Omnicef [Cefdinir] Nausea And Vomiting  . Avelox [Moxifloxacin] Hives and Itching   Patient Measurements: Weight: 157 lb 10.1 oz (71.5 kg) Heparin Dosing Weight: using total body weight of 71.2 kg  Vital Signs: Temp: 98.3 F (36.8 C) (03/09 2127) Temp Source: Oral (03/09 2127) BP: 125/69 (03/09 2127) Pulse Rate: 82 (03/09 2127)  Labs:  Recent Labs  05/09/16 2006 05/09/16 2352 05/10/16 0624 05/10/16 1159 05/10/16 2146  HGB 16.1*  --  13.3  --   --   HCT 48.2*  --  41.1  --   --   PLT 247  --  212  --   --   APTT  --  32  --   --  22*  LABPROT  --  13.9  --   --   --   INR  --  1.06  --   --   --   HEPARINUNFRC  --   --   --  >2.20*  --   CREATININE 0.93  --  0.78  --   --   TROPONINI  --  <0.03 <0.03 <0.03  --    Estimated Creatinine Clearance: 63 mL/min (by C-G formula based on SCr of 0.78 mg/dL).  Medical History: Past Medical History:  Diagnosis Date  . Adenomatous polyp of colon 10/2013   f/u colo 10/2018  . Allergic rhinitis   . ALLERGIC RHINITIS   . ANXIETY    "situational" (02/14/2015)  . Arthritis    "neck, back, hands" (02/14/2015)  .  Asthma   . Atrial fibrillation with RVR ([place])   . Atrial flutter ([place])    failed cardioversion 11/2012; s/p ablation 02-02-2013 by Dr Rayann Heman  . Chronic bronchitis ([place])    "I'll have it most years" (02/14/2015)  . Diverticulosis   . Endometriosis   . Essential hypertension 05/30/2014  . Oral candidiasis   . Osteopenia 08/2015   T score -1.7 FRAX 14%/4.4% overall stable from prior DEXA.  [name] Pneumothorax on left 1996   left lower lung collapse s/p resection  . Tobacco abuse    Assessment: 71 y.o.femalewith atrial fibrillation on Eliquis PTA s/p partial colectomy due to bowel obstruction 05/04/15, s/p of LLL lung lobectomy, whopresents with nausea, vomiting and abdominal pain, found to have recurrent small bowel obstruction on the CT scan.  Surgery consulted and recommending conservative management for now but holding apixaban in case needs surgery.  CHA2DS2-VASc Scoreis 3.  Starting patient on heparin infusion in the meantime as bridge.  Per patient report, last apixaban dose 3/8 at 1000.  APTT 32 seconds last night.  Will check a baseline heparin level (anti-Xa level) since this will most likely be affected by recent  apixaban use.  Today's labs:  SCr WNL, CrCl>30 ml/min Hgb, platelets WNL Heparin level >2.20  Goal of Therapy:  Heparin level 0.3-0.7 units/ml aPTT 66-102 seconds Monitor platelets by anticoagulation protocol: Yes   Today, 05/10/2016 Start heparin infusion at 1000 units/hr.  Do not feel need to provide bolus given recent apixaban use. With HL elevated, will continue to use aPTT to titrate heparin level until effects of apixaban have diminished.  Will check a heparin level at least daily with aPTT until levels correlate. 1st aPTT 8 hr after Heparin started: 22 sec, below goal range  Plan:  Increase Heparin infusion to 1300 units/hr  Heparin level daily, next aPTT with am labs   F/u plans for potential surgery.  Daily CBC while on heparin infusion.  Minda Ditto  PharmD Pager [phone] 05/10/2016, 10:40 PM

## 2016-05-10 NOTE — Telephone Encounter (Signed)
Follow up    Pt said to call cell phone-she has it with her at the [facility].

## 2016-05-11 ENCOUNTER — Encounter (HOSPITAL_COMMUNITY): Payer: Self-pay

## 2016-05-11 ENCOUNTER — Inpatient Hospital Stay (HOSPITAL_COMMUNITY): Payer: Medicare Other

## 2016-05-11 LAB — APTT: aPTT: 98 seconds — ABNORMAL HIGH (ref 24–36)

## 2016-05-11 LAB — BASIC METABOLIC PANEL
Anion gap: 5 (ref 5–15)
BUN: 7 mg/dL (ref 6–20)
CO2: 23 mmol/L (ref 22–32)
Calcium: 8.1 mg/dL — ABNORMAL LOW (ref 8.9–10.3)
Chloride: 113 mmol/L — ABNORMAL HIGH (ref 101–111)
Creatinine, Ser: 0.59 mg/dL (ref 0.44–1.00)
GFR calc Af Amer: >60 mL/min (ref >60)
GFR calc non Af Amer: >60 mL/min (ref >60)
Glucose, Bld: 84 mg/dL (ref 65–99)
Potassium: 3.5 mmol/L (ref 3.5–5.1)
Sodium: 141 mmol/L (ref 135–145)

## 2016-05-11 LAB — HEPARIN LEVEL (UNFRACTIONATED): Heparin Unfractionated: 1.04 IU/mL — ABNORMAL HIGH (ref 0.30–0.70)

## 2016-05-11 LAB — URINE CULTURE

## 2016-05-11 LAB — CBC
HCT: 36.5 % (ref 36.0–46.0)
Hemoglobin: 11.6 g/dL — ABNORMAL LOW (ref 12.0–15.0)
MCH: 28.6 pg (ref 26.0–34.0)
MCHC: 31.8 g/dL (ref 30.0–36.0)
MCV: 89.9 fL (ref 78.0–100.0)
Platelets: 191 10*3/uL (ref 150–400)
RBC: 4.06 MIL/uL (ref 3.87–5.11)
RDW: 14.2 % (ref 11.5–15.5)
WBC: 6.7 10*3/uL (ref 4.0–10.5)

## 2016-05-11 LAB — GLUCOSE, CAPILLARY: Glucose-Capillary: 79 mg/dL (ref 65–99)

## 2016-05-11 MED ORDER — ACETAMINOPHEN 160 MG/5ML PO SOLN
160.0000 mg | Freq: Four times a day (QID) | ORAL | Status: DC | PRN
  Administered 2016-05-11: 160 mg via ORAL
  Filled 2016-05-11: qty 20.3

## 2016-05-11 MED ORDER — SODIUM CHLORIDE 0.9 % IV SOLN: 30.0000 meq | Freq: Once | INTRAVENOUS | Status: DC

## 2016-05-11 MED ORDER — KETOROLAC TROMETHAMINE 15 MG/ML IJ SOLN
15.0000 mg | Freq: Once | INTRAMUSCULAR | Status: AC
  Administered 2016-05-11: 15 mg via INTRAVENOUS
  Filled 2016-05-11: qty 1

## 2016-05-11 MED ORDER — KETOROLAC TROMETHAMINE 15 MG/ML IJ SOLN
15.0000 mg | Freq: Four times a day (QID) | INTRAMUSCULAR | Status: DC | PRN
Start: 2016-05-11 — End: 2016-05-13
  Administered 2016-05-11 (×2): 15 mg via INTRAVENOUS
  Filled 2016-05-11 (×2): qty 1

## 2016-05-11 MED ORDER — POTASSIUM CHLORIDE 10 MEQ/100ML IV SOLN
10.0000 meq | INTRAVENOUS | Status: AC
  Administered 2016-05-11 (×3): 10 meq via INTRAVENOUS
  Filled 2016-05-11 (×3): qty 100

## 2016-05-11 NOTE — Progress Notes (Signed)
[place] for Heparin Indication: atrial fibrillation  Allergies  Allergen Reactions  . Tramadol Other (See Comments)    Reaction:  Dizziness and weakness   . Amoxicillin-Pot Clavulanate Nausea And Vomiting and Other (See Comments)    Has patient had a PCN reaction causing immediate rash, facial/tongue/throat swelling, SOB or lightheadedness with hypotension: No Has patient had a PCN reaction causing severe rash involving mucus membranes or skin necrosis: No Has patient had a PCN reaction that required hospitalization No Has patient had a PCN reaction occurring within the last 10 years: No If all of the above answers are "NO", then may proceed with Cephalosporin use.  . Flagyl [Metronidazole] Nausea And Vomiting  . Flecainide Other (See Comments)    Reaction:  Dizziness   . Omnicef [Cefdinir] Nausea And Vomiting  . Avelox [Moxifloxacin] Hives and Itching   Patient Measurements: Height: 5\' 5"  (165.1 cm) Weight: 157 lb 10.1 oz (71.5 kg) IBW/kg (Calculated) : 57 Heparin Dosing Weight: using total body weight of 71.2 kg  Vital Signs: Temp: 98.1 F (36.7 C) (03/10 0514) Temp Source: Oral (03/10 0514) BP: 118/62 (03/10 0514) Pulse Rate: 70 (03/10 0514)  Labs:  Recent Labs  05/09/16 2006 05/09/16 2352 05/10/16 0624 05/10/16 1159 05/10/16 2146 05/11/16 0640  HGB 16.1*  --  13.3  --   --  11.6*  HCT 48.2*  --  41.1  --   --  36.5  PLT 247  --  212  --   --  191  APTT  --  32  --   --  22* 98*  LABPROT  --  13.9  --   --   --   --   INR  --  1.06  --   --   --   --   HEPARINUNFRC  --   --   --  >2.20*  --  1.04*  CREATININE 0.93  --  0.78  --   --  0.59  TROPONINI  --  <0.03 <0.03 <0.03  --   --    Estimated Creatinine Clearance: 63 mL/min (by C-G formula based on SCr of 0.59 mg/dL).  Medical History: Past Medical History:  Diagnosis Date  . Adenomatous polyp of colon 10/2013   f/u colo 10/2018  . Allergic rhinitis   . ALLERGIC  RHINITIS   . ANXIETY    "situational" (02/14/2015)  . Arthritis    "neck, back, hands" (02/14/2015)  . Asthma   . Atrial fibrillation with RVR ([place])   . Atrial flutter ([place])    failed cardioversion 11/2012; s/p ablation 02-02-2013 by Dr Rayann Heman  . Chronic bronchitis ([place])    "I'll have it most years" (02/14/2015)  . Diverticulosis   . Endometriosis   . Essential hypertension 05/30/2014  . Oral candidiasis   . Osteopenia 08/2015   T score -1.7 FRAX 14%/4.4% overall stable from prior DEXA.  [name] Pneumothorax on left 1996   left lower lung collapse s/p resection  . Tobacco abuse    Assessment: 71 y.o.femalewith atrial fibrillation on Eliquis PTA s/p partial colectomy due to bowel obstruction 05/04/15, s/p of LLL lung lobectomy, whopresents with nausea, vomiting and abdominal pain, found to have recurrent small bowel obstruction on the CT scan.  Surgery consulted and recommending conservative management for now but holding apixaban in case needs surgery.  CHA2DS2-VASc Scoreis 3.  Starting patient on heparin infusion in the meantime as bridge.  Per patient report, last apixaban dose 3/8  at 1000.  APTT 32 seconds last night.  Will check a baseline heparin level (anti-Xa level) since this will most likely be affected by recent apixaban use.  Today, 05/11/2016:  Symptoms of SBO improving with NG per surgery note.   aPTT in therapeutic range, HL supratherapeuitc, on 1300units/hr.   With HL elevated, will continue to use aPTT to titrate heparin level until effects of apixaban have diminished.  CBC ok but trending down. Follow. No bleeding reported/documented.  SCr normal.  Goal of Therapy:  Heparin level 0.3-0.7 units/ml aPTT 66-102 seconds Monitor platelets by anticoagulation protocol: Yes   Plan:  Cont heparin infusion at 1300units/hr  Heparin level daily, next aPTT with am labs   F/u plans for potential surgery.  Daily CBC while on heparin infusion.  Romeo Rabon, PharmD,  pager [phone]. 05/11/2016,9:28 AM.

## 2016-05-11 NOTE — Progress Notes (Signed)
Subjective: Complains of having to swallow pills with ng   Objective: Vital signs in last 24 hours: Temp:  [98.1 F (36.7 C)-98.3 F (36.8 C)] 98.1 F (36.7 C) (03/10 0514) Pulse Rate:  [48-153] 70 (03/10 0514) Resp:  [14-27] 19 (03/10 0514) BP: (118-155)/(62-97) 118/62 (03/10 0514) SpO2:  [88 %-96 %] 93 % (03/10 0514) Weight:  [71.5 kg (157 lb 10.1 oz)] 71.5 kg (157 lb 10.1 oz) (03/09 2127) Last BM Date: 05/10/16  Intake/Output from previous day: 03/09 0701 - 03/10 0700 In: 3267.3 [I.V.:3267.3] Out: 1601 [Urine:1450; Emesis/NG output:150; Stool:1] Intake/Output this shift: No intake/output data recorded.  Resp: clear to auscultation bilaterally Cardio: regular rate and rhythm GI: soft, nontender. less distended. passing small amount of flatus  Lab Results:   Recent Labs  05/10/16 0624 05/11/16 0640  WBC 8.6 6.7  HGB 13.3 11.6*  HCT 41.1 36.5  PLT 212 191   BMET  Recent Labs  05/10/16 0624 05/11/16 0640  NA 141 141  K 3.8 3.5  CL 111 113*  CO2 24 23  GLUCOSE 105* 84  BUN 13 7  CREATININE 0.78 0.59  CALCIUM 8.4* 8.1*   PT/INR  Recent Labs  05/09/16 2352  LABPROT 13.9  INR 1.06   ABG No results for input(s): PHART, HCO3 in the last 72 hours.  Invalid input(s): PCO2, PO2  Studies/Results: Ct Abdomen Pelvis W Contrast  Result Date: 05/09/2016 CLINICAL DATA:  Intermittent burning abdominal pain since last evening. History of atrial fibrillation and flutter, diverticulosis, GERD and asthma. EXAM: CT ABDOMEN AND PELVIS WITH CONTRAST TECHNIQUE: Multidetector CT imaging of the abdomen and pelvis was performed using the standard protocol following bolus administration of intravenous contrast. CONTRAST:  163mL ISOVUE-300 IOPAMIDOL (ISOVUE-300) INJECTION 61% COMPARISON:  CT from 05/01/2015 FINDINGS: Lower chest: No acute abnormality. Hepatobiliary: Mild hepatic steatosis. No biliary dilatation. Unremarkable gallbladder. No space-occupying mass of the liver.  Pancreas: Unremarkable. No pancreatic ductal dilatation or surrounding inflammatory changes. Spleen: Normal in size without focal abnormality. Adrenals/Urinary Tract: Adrenal glands are unremarkable. Kidneys are normal, without renal calculi, focal lesion, or hydronephrosis. Bladder is unremarkable. Stomach/Bowel: Fluid-filled mild distention of the stomach. There is a small sliding-type hiatal hernia. A duodenal diverticulum is identified off second portion. Dilated fluid-filled loops of jejunum identified in the mid abdomen extending to a transition point in the mid pelvis, series 2, image 57. Proximal to this, there is fecalized material within distended small bowel measuring up to 3.6 cm. Rather long segmental luminal narrowing is identified with mild transmural thickening and a transition point. Some of this may be simply due to under distention of bowel. Other differential possibilities might include changes from inflammatory bowel disease or potentially an infiltrating abnormalities such as lymphoma though believed less likely given lack of other ancillary findings nor adenopathy. Postoperative adhesion is favored given the presence of adjacent chain sutures from prior partial colectomy/sigmoid resection within the pelvis. Vascular/Lymphatic: Aortoiliac atherosclerosis.  No lymphadenopathy. Reproductive: Status post hysterectomy. No adnexal masses. Other: No abdominal wall hernia or abnormality. No abdominopelvic ascites. Musculoskeletal: Levoconvex curvature of the lumbar spine. 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 Grade 1 anterolisthesis of L4 on L5 secondary to facet arthropathy. IMPRESSION: 1. There is small bowel obstruction believed secondary to an adhesion in the mid pelvis given adjacent changes from prior partial colectomy. Obstruction secondary to changes from inflammatory bowel or an infiltrative neoplasm are believed less likely. 2. Mild hepatic steatosis. 3. Hysterectomy.  No adnexal mass. 4.  Aortoiliac atherosclerosis. Electronically Signed  By: Ashley Royalty M.D.   On: 05/09/2016 21:50   Dg Abd Portable 1v-small Bowel Obstruction Protocol-initial, 8 Hr Delay  Result Date: 05/10/2016 CLINICAL DATA:  70 y/o F; small-bowel obstruction protocol, 8 hour delayed film. EXAM: PORTABLE ABDOMEN - 1 VIEW COMPARISON:  05/10/2016 abdomen radiograph. FINDINGS: Enteric tube tip projects over the mid stomach. Persistent small bowel obstruction. There is contrast within the colon extending to rectum. Moderate rotatory dextrocurvature of the lumbar spine is stable. IMPRESSION: Persistent small bowel obstruction. Contrast within the colon extending to rectum. Electronically Signed   By: Kristine Garbe M.D.   On: 05/10/2016 21:04   Dg Abd Portable 1v-small Bowel Protocol-position Verification  Result Date: 05/10/2016 CLINICAL DATA:  Evaluate NG tube placement. Small bowel obstruction. EXAM: PORTABLE ABDOMEN - 1 VIEW COMPARISON:  May 09, 2016 FINDINGS: The NG tube is appropriately located in the left upper quadrant. Dilated small bowel loops in the left central abdomen are again identified. IMPRESSION: The NG tube appears to be in good position. Electronically Signed   By: Dorise Bullion III M.D   On: 05/10/2016 10:36    Anti-infectives: Anti-infectives    None      Assessment/Plan: s/p * No surgery found * Continue ng another day then try clamping  sbo seems to be improving. sb looks less distended on today's xray ambulate  LOS: 2 days    TOTH III,Alysiah Suppa S 05/11/2016

## 2016-05-11 NOTE — Progress Notes (Signed)
MD on call notified that patient went into Afib for about an hour and a half and had a 3.14 sec pause. Patient currently back in NSR with a rate of 72. MD gave orders to hold Cardizem drip for now and notify if patient goes back into Afib.  Will continue to monitor patient.

## 2016-05-11 NOTE — Progress Notes (Signed)
PROGRESS NOTE  [name] Awan KZS:[id number] DOB: Aug 25, 1945 DOA: 05/09/2016 PCP: [name], MD   LOS: 2 days   Brief Narrative: Elizabeth Mcconnell is a 71 y.o. female with medical history significant of hypertension, asthma, tobacco abuse, left pneumothorax, atrial fibrillation on Eliquis, partial colectomy due to bowel obstruction 05/04/15, s/p of LLL lung lobectomy, who presents with nausea, vomiting and abdominal pain, found to have recurrent small bowel obstruction on the CT scan.  General surgery was consulted, and due to multiple medical problems she was admitted on the hospitalist service  Assessment & Plan: Principal Problem:   SBO (small bowel obstruction) Active Problems:   GAD (generalized anxiety disorder)   Asthma, moderate persistent   Essential hypertension   Atrial fibrillation with RVR ([facility])   Tobacco abuse   SBO (small bowel obstruction)  -Patient underwent a CT scan of the abdomen and pelvis in the emergency room which showed small bowel obstruction with transition point in mid pelvis -General surgery consulted, appreciate input, continue conservative management -Discussed with Dr. Marlou Starks, she appears to be improving, contrast is progressing on serial abdominal films, he plans to keep the NG tube for 1 more day and clamp it tomorrow.  Atrial fibrillation with RVR  -CHA2DS2-VASc Score is 3, needs oral anticoagulation.  She is on Eliquis at home, continue to hold and keep patient on heparin infusion meanwhile. -Rates better today, continue Tikosyn and diltiazem   Asthma, moderate persistent  -stable, no wheezing -prn Xopenex nebs -continue flovent inhaler and singular  HTN -on Cardizem -prn IV hydralazine  GAD (generalized anxiety disorder) -continue home Xanax  Tobacco abuse -Did counseling about importance of quitting smoking -Nicotine patch  Positive UA -UA has small amount of leukocyte, but patient does not have symptoms of UTI. -f/u  urine culture   DVT prophylaxis: Eliquis Code Status: Full code Family Communication: No family at bedside Disposition Plan: home when ready  Consultants:   General surgery  Procedures:   None   Antimicrobials:  None    Subjective: - no chest pain, shortness of breath/ Had BMs last night, passing gas this morning  Objective: Vitals:   05/10/16 1833 05/10/16 1900 05/10/16 2127 05/11/16 0514  BP: 150/79 125/72 125/69 118/62  Pulse: 92 88 82 70  Resp: 18 14 18 19   Temp:   98.3 F (36.8 C) 98.1 F (36.7 C)  TempSrc:   Oral Oral  SpO2: 95% 96% 92% 93%  Weight:   71.5 kg (157 lb 10.1 oz)   Height:    5\' 5"  (1.651 m)    Intake/Output Summary (Last 24 hours) at 05/11/16 1137 Last data filed at 05/11/16 0700  Gross per 24 hour  Intake          3267.33 ml  Output             1601 ml  Net          1666.33 ml   Filed Weights   05/10/16 2127  Weight: 71.5 kg (157 lb 10.1 oz)    Examination: Constitutional: NAD Vitals:   05/10/16 1833 05/10/16 1900 05/10/16 2127 05/11/16 0514  BP: 150/79 125/72 125/69 118/62  Pulse: 92 88 82 70  Resp: 18 14 18 19   Temp:   98.3 F (36.8 C) 98.1 F (36.7 C)  TempSrc:   Oral Oral  SpO2: 95% 96% 92% 93%  Weight:   71.5 kg (157 lb 10.1 oz)   Height:    5\' 5"  (1.651 m)  Eyes: PERRL, lids and conjunctivae normal ENMT: Mucous membranes are moist. No oropharyngeal exudates Neck: normal, supple, no masses, no thyromegaly Respiratory: clear to auscultation bilaterally, no wheezing, no crackles. Normal respiratory effort. No accessory muscle use.  Cardiovascular: Regular rate and rhythm, no murmurs / rubs / gallops. No LE edema. 2+ pedal pulses.  Abdomen: no tenderness. Bowel sounds positive.    Data Reviewed: I have personally reviewed following labs and imaging studies  CBC:  Recent Labs Lab 05/09/16 2006 05/10/16 0624 05/11/16 0640  WBC 11.4* 8.6 6.7  HGB 16.1* 13.3 11.6*  HCT 48.2* 41.1 36.5  MCV 89.1 90.3 89.9  PLT  247 212 016   Basic Metabolic Panel:  Recent Labs Lab 05/09/16 2006 05/10/16 0624 05/11/16 0640  NA 142 141 141  K 3.8 3.8 3.5  CL 106 111 113*  CO2 25 24 23   GLUCOSE 120* 105* 84  BUN 14 13 7   CREATININE 0.93 0.78 0.59  CALCIUM 9.6 8.4* 8.1*   GFR: Estimated Creatinine Clearance: 63 mL/min (by C-G formula based on SCr of 0.59 mg/dL). Liver Function Tests:  Recent Labs Lab 05/09/16 2006  AST 18  ALT 17  ALKPHOS 73  BILITOT 1.1  PROT 7.8  ALBUMIN 4.8    Recent Labs Lab 05/09/16 2006  LIPASE 36   No results for input(s): AMMONIA in the last 168 hours. Coagulation Profile:  Recent Labs Lab 05/09/16 2352  INR 1.06   Cardiac Enzymes:  Recent Labs Lab 05/09/16 2352 05/10/16 0624 05/10/16 1159  TROPONINI <0.03 <0.03 <0.03   BNP (last 3 results) No results for input(s): PROBNP in the last 8760 hours. HbA1C: No results for input(s): HGBA1C in the last 72 hours. CBG:  Recent Labs Lab 05/10/16 0813 05/11/16 0814  GLUCAP 121* 79   Lipid Profile: No results for input(s): CHOL, HDL, LDLCALC, TRIG, CHOLHDL, LDLDIRECT in the last 72 hours. Thyroid Function Tests:  Recent Labs  05/10/16 0624  TSH 3.249   Anemia Panel: No results for input(s): VITAMINB12, FOLATE, FERRITIN, TIBC, IRON, RETICCTPCT in the last 72 hours. Urine analysis:    Component Value Date/Time   COLORURINE AMBER (A) 05/09/2016 1941   APPEARANCEUR HAZY (A) 05/09/2016 1941   LABSPEC 1.025 05/09/2016 1941   PHURINE 7.0 05/09/2016 1941   GLUCOSEU 50 (A) 05/09/2016 1941   GLUCOSEU NEGATIVE 10/21/2014 1019   HGBUR NEGATIVE 05/09/2016 1941   BILIRUBINUR SMALL (A) 05/09/2016 1941   KETONESUR 20 (A) 05/09/2016 1941   PROTEINUR 100 (A) 05/09/2016 1941   UROBILINOGEN 1.0 10/21/2014 1019   NITRITE NEGATIVE 05/09/2016 1941   LEUKOCYTESUR SMALL (A) 05/09/2016 1941   Sepsis Labs: Invalid input(s): PROCALCITONIN, LACTICIDVEN  Recent Results (from the past 240 hour(s))  Urine culture      Status: Abnormal   Collection Time: 05/09/16  7:41 PM  Result Value Ref Range Status   Specimen Description URINE, CLEAN CATCH  Final   Special Requests NONE  Final   Culture MULTIPLE SPECIES PRESENT, SUGGEST RECOLLECTION (A)  Final   Report Status 05/11/2016 FINAL  Final      Radiology Studies: Ct Abdomen Pelvis W Contrast  Result Date: 05/09/2016 CLINICAL DATA:  Intermittent burning abdominal pain since last evening. History of atrial fibrillation and flutter, diverticulosis, GERD and asthma. EXAM: CT ABDOMEN AND PELVIS WITH CONTRAST TECHNIQUE: Multidetector CT imaging of the abdomen and pelvis was performed using the standard protocol following bolus administration of intravenous contrast. CONTRAST:  119mL ISOVUE-300 IOPAMIDOL (ISOVUE-300) INJECTION 61% COMPARISON:  CT from  05/01/2015 FINDINGS: Lower chest: No acute abnormality. Hepatobiliary: Mild hepatic steatosis. No biliary dilatation. Unremarkable gallbladder. No space-occupying mass of the liver. Pancreas: Unremarkable. No pancreatic ductal dilatation or surrounding inflammatory changes. Spleen: Normal in size without focal abnormality. Adrenals/Urinary Tract: Adrenal glands are unremarkable. Kidneys are normal, without renal calculi, focal lesion, or hydronephrosis. Bladder is unremarkable. Stomach/Bowel: Fluid-filled mild distention of the stomach. There is a small sliding-type hiatal hernia. A duodenal diverticulum is identified off second portion. Dilated fluid-filled loops of jejunum identified in the mid abdomen extending to a transition point in the mid pelvis, series 2, image 57. Proximal to this, there is fecalized material within distended small bowel measuring up to 3.6 cm. Rather long segmental luminal narrowing is identified with mild transmural thickening and a transition point. Some of this may be simply due to under distention of bowel. Other differential possibilities might include changes from inflammatory bowel disease or  potentially an infiltrating abnormalities such as lymphoma though believed less likely given lack of other ancillary findings nor adenopathy. Postoperative adhesion is favored given the presence of adjacent chain sutures from prior partial colectomy/sigmoid resection within the pelvis. Vascular/Lymphatic: Aortoiliac atherosclerosis.  No lymphadenopathy. Reproductive: Status post hysterectomy. No adnexal masses. Other: No abdominal wall hernia or abnormality. No abdominopelvic ascites. Musculoskeletal: Levoconvex curvature of the lumbar spine. 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 Grade 1 anterolisthesis of L4 on L5 secondary to facet arthropathy. IMPRESSION: 1. There is small bowel obstruction believed secondary to an adhesion in the mid pelvis given adjacent changes from prior partial colectomy. Obstruction secondary to changes from inflammatory bowel or an infiltrative neoplasm are believed less likely. 2. Mild hepatic steatosis. 3. Hysterectomy.  No adnexal mass. 4. Aortoiliac atherosclerosis. Electronically Signed   By: Ashley Royalty M.D.   On: 05/09/2016 21:50   Dg Abd Portable 1v-small Bowel Obstruction Protocol-initial, 8 Hr Delay  Result Date: 05/10/2016 CLINICAL DATA:  71 y/o F; small-bowel obstruction protocol, 8 hour delayed film. EXAM: PORTABLE ABDOMEN - 1 VIEW COMPARISON:  05/10/2016 abdomen radiograph. FINDINGS: Enteric tube tip projects over the mid stomach. Persistent small bowel obstruction. There is contrast within the colon extending to rectum. Moderate rotatory dextrocurvature of the lumbar spine is stable. IMPRESSION: Persistent small bowel obstruction. Contrast within the colon extending to rectum. Electronically Signed   By: Kristine Garbe M.D.   On: 05/10/2016 21:04   Dg Abd Portable 1v-small Bowel Protocol-position Verification  Result Date: 05/10/2016 CLINICAL DATA:  Evaluate NG tube placement. Small bowel obstruction. EXAM: PORTABLE ABDOMEN - 1 VIEW COMPARISON:  May 09, 2016  FINDINGS: The NG tube is appropriately located in the left upper quadrant. Dilated small bowel loops in the left central abdomen are again identified. IMPRESSION: The NG tube appears to be in good position. Electronically Signed   By: Dorise Bullion III M.D   On: 05/10/2016 10:36   Dg Abd Portable 2v  Result Date: 05/11/2016 CLINICAL DATA:  Abdominal distension. Follow-up small bowel obstruction. EXAM: PORTABLE ABDOMEN - 2 VIEW COMPARISON:  KUB dated 05/10/2016. FINDINGS: On today's exam, there are no dilated small bowel loops identified. Contrast again noted within the colon, now to the level of the rectum, excluding complete small bowel obstruction. Nasogastric tube appears stable in the stomach. No evidence of free intraperitoneal air. No evidence of soft tissue mass or abnormal fluid collection. Mild dextroscoliosis of the lumbar spine. IMPRESSION: 1. No radiographic evidence of small-bowel obstruction on today's exam. No dilated small bowel loops seen. Oral contrast from previous exam is now  seen throughout the colon and to the level of the rectum. 2. Nasogastric tube in the stomach. Electronically Signed   By: Franki Cabot M.D.   On: 05/11/2016 09:44     Scheduled Meds: . budesonide (PULMICORT) nebulizer solution  0.25 mg Nebulization BID  . diltiazem  300 mg Oral QHS  . dofetilide  250 mcg Oral BID  . [START ON 05/13/2016] estradiol  0.05 mg Transdermal Weekly  . fluticasone  1 spray Each Nare Daily  . levalbuterol  1.25 mg Nebulization TID  . magnesium oxide  400 mg Oral QHS  . montelukast  10 mg Oral Daily  . nicotine  21 mg Transdermal Daily  . sodium chloride flush  3 mL Intravenous Q12H  . cyanocobalamin  2,000 mcg Oral Daily   Continuous Infusions: . sodium chloride 100 mL/hr at 05/11/16 1130  . diltiazem (CARDIZEM) infusion Stopped (05/11/16 0551)  . heparin 1,300 Units/hr (05/11/16 0829)     Marzetta Board, MD, PhD [facility] Pager [phone]  If 7PM-7AM,  please contact night-coverage [email or web address] Password [facility] 05/11/2016, 11:37 AM

## 2016-05-11 NOTE — Progress Notes (Signed)
Pt has gone back into Afib per tele and cardiac assessment. Patient told me that she could feel her fast heart rate and thought she was going back into Afib as well. Patient also has a history of Afib and Aflutter. BP: 144/77, HR 122, denies SOB.  PO Cardizem was given as scheduled. MD notified. Will continue to monitor closely.

## 2016-05-12 LAB — BASIC METABOLIC PANEL
Anion gap: 8 (ref 5–15)
BUN: 7 mg/dL (ref 6–20)
CO2: 19 mmol/L — ABNORMAL LOW (ref 22–32)
Calcium: 7.4 mg/dL — ABNORMAL LOW (ref 8.9–10.3)
Chloride: 112 mmol/L — ABNORMAL HIGH (ref 101–111)
Creatinine, Ser: 0.56 mg/dL (ref 0.44–1.00)
GFR calc Af Amer: >60 mL/min (ref >60)
GFR calc non Af Amer: >60 mL/min (ref >60)
Glucose, Bld: 73 mg/dL (ref 65–99)
Potassium: 3.4 mmol/L — ABNORMAL LOW (ref 3.5–5.1)
Sodium: 139 mmol/L (ref 135–145)

## 2016-05-12 LAB — CBC
HCT: 34.6 % — ABNORMAL LOW (ref 36.0–46.0)
Hemoglobin: 11.4 g/dL — ABNORMAL LOW (ref 12.0–15.0)
MCH: 29.5 pg (ref 26.0–34.0)
MCHC: 32.9 g/dL (ref 30.0–36.0)
MCV: 89.6 fL (ref 78.0–100.0)
Platelets: 180 10*3/uL (ref 150–400)
RBC: 3.86 MIL/uL — ABNORMAL LOW (ref 3.87–5.11)
RDW: 13.7 % (ref 11.5–15.5)
WBC: 8.7 10*3/uL (ref 4.0–10.5)

## 2016-05-12 LAB — MAGNESIUM: Magnesium: 1.7 mg/dL (ref 1.7–2.4)

## 2016-05-12 LAB — HEPARIN LEVEL (UNFRACTIONATED)
Heparin Unfractionated: 0.62 IU/mL (ref 0.30–0.70)
Heparin Unfractionated: 0.59 IU/mL (ref 0.30–0.70)

## 2016-05-12 LAB — GLUCOSE, CAPILLARY: Glucose-Capillary: 80 mg/dL (ref 65–99)

## 2016-05-12 LAB — APTT: aPTT: 111 seconds — ABNORMAL HIGH (ref 24–36)

## 2016-05-12 MED ORDER — POTASSIUM CHLORIDE CRYS ER 20 MEQ PO TBCR
30.0000 meq | EXTENDED_RELEASE_TABLET | ORAL | Status: AC
  Administered 2016-05-12 (×2): 30 meq via ORAL
  Filled 2016-05-12 (×2): qty 1

## 2016-05-12 MED ORDER — MAGNESIUM SULFATE 2 GM/50ML IV SOLN
2.0000 g | Freq: Once | INTRAVENOUS | Status: AC
  Administered 2016-05-12: 2 g via INTRAVENOUS
  Filled 2016-05-12: qty 50

## 2016-05-12 MED ORDER — LEVALBUTEROL HCL 1.25 MG/0.5ML IN NEBU
1.2500 mg | INHALATION_SOLUTION | Freq: Four times a day (QID) | RESPIRATORY_TRACT | Status: DC | PRN
  Administered 2016-05-12 – 2016-05-13 (×2): 1.25 mg via RESPIRATORY_TRACT
  Filled 2016-05-12 (×3): qty 0.5

## 2016-05-12 NOTE — Progress Notes (Signed)
  Subjective: No complaints. Passing flatus and having bm'Mcconnell  Objective: Vital signs in last 24 hours: Temp:  [98.3 F (36.8 C)-98.7 F (37.1 C)] 98.3 F (36.8 C) (03/11 0426) Pulse Rate:  [78-122] 78 (03/11 0426) Resp:  [18] 18 (03/11 0426) BP: (121-162)/(70-77) 132/70 (03/11 0426) SpO2:  [92 %-97 %] 97 % (03/11 0754) Last BM Date: 05/12/16  Intake/Output from previous day: 03/10 0701 - 03/11 0700 In: 2699 [I.V.:2599; IV Piggyback:100] Out: 1750 [Urine:1100; Emesis/NG output:650] Intake/Output this shift: Total I/O In: -  Out: 1000 [Urine:1000]  Resp: clear to auscultation bilaterally Cardio: regular rate and rhythm GI: soft, minimal tenderness. good bs  Lab Results:   Recent Labs  05/11/16 0640 05/12/16 0533  WBC 6.7 8.7  HGB 11.6* 11.4*  HCT 36.5 34.6*  PLT 191 180   BMET  Recent Labs  05/10/16 0624 05/11/16 0640  NA 141 141  K 3.8 3.5  CL 111 113*  CO2 24 23  GLUCOSE 105* 84  BUN 13 7  CREATININE 0.78 0.59  CALCIUM 8.4* 8.1*   PT/INR  Recent Labs  05/09/16 2352  LABPROT 13.9  INR 1.06   ABG No results for input(Mcconnell): PHART, HCO3 in the last 72 hours.  Invalid input(Mcconnell): PCO2, PO2  Studies/Results: Dg Abd Portable 1v-small Bowel Obstruction Protocol-initial, 8 Hr Delay  Result Date: 05/10/2016 CLINICAL DATA:  71 y/o F; small-bowel obstruction protocol, 8 hour delayed film. EXAM: PORTABLE ABDOMEN - 1 VIEW COMPARISON:  05/10/2016 abdomen radiograph. FINDINGS: Enteric tube tip projects over the mid stomach. Persistent small bowel obstruction. There is contrast within the colon extending to rectum. Moderate rotatory dextrocurvature of the lumbar spine is stable. IMPRESSION: Persistent small bowel obstruction. Contrast within the colon extending to rectum. Electronically Signed   By: Kristine Garbe M.D.   On: 05/10/2016 21:04   Dg Abd Portable 2v  Result Date: 05/11/2016 CLINICAL DATA:  Abdominal distension. Follow-up small bowel  obstruction. EXAM: PORTABLE ABDOMEN - 2 VIEW COMPARISON:  KUB dated 05/10/2016. FINDINGS: On today'Mcconnell exam, there are no dilated small bowel loops identified. Contrast again noted within the colon, now to the level of the rectum, excluding complete small bowel obstruction. Nasogastric tube appears stable in the stomach. No evidence of free intraperitoneal air. No evidence of soft tissue mass or abnormal fluid collection. Mild dextroscoliosis of the lumbar spine. IMPRESSION: 1. No radiographic evidence of small-bowel obstruction on today'Mcconnell exam. No dilated small bowel loops seen. Oral contrast from previous exam is now seen throughout the colon and to the level of the rectum. 2. Nasogastric tube in the stomach. Electronically Signed   By: Franki Cabot M.D.   On: 05/11/2016 09:44    Anti-infectives: Anti-infectives    None      Assessment/Plan: Mcconnell/p * No surgery found * Advance diet. Start clears D/c  Ng Ambulate sbo seems to be resolving  LOS: 3 days    Elizabeth Mcconnell,Elizabeth Mcconnell 05/12/2016

## 2016-05-12 NOTE — Progress Notes (Signed)
[place] for Heparin Indication: atrial fibrillation  Labs:  Recent Labs  05/09/16 2352 05/10/16 0624  05/10/16 1159 05/10/16 2146 05/11/16 0640 05/12/16 0532 05/12/16 0533 05/12/16 1702  HGB  --  13.3  --   --   --  11.6*  --  11.4*  --   HCT  --  41.1  --   --   --  36.5  --  34.6*  --   PLT  --  212  --   --   --  191  --  180  --   APTT 32  --   --   --  22* 98*  --  111*  --   LABPROT 13.9  --   --   --   --   --   --   --   --   INR 1.06  --   --   --   --   --   --   --   --   HEPARINUNFRC  --   --   < > >2.20*  --  1.04*  --  0.62 0.59  CREATININE  --  0.78  --   --   --  0.59 0.56  --   --   TROPONINI <0.03 <0.03  --  <0.03  --   --   --   --   --   < > = values in this interval not displayed. Estimated Creatinine Clearance: 63 mL/min (by C-G formula based on SCr of 0.56 mg/dL).  Today, 05/12/2016:  Heparin level in the target range on 1200units/hr  Goal of Therapy:  Heparin level 0.3-0.7 Monitor platelets by anticoagulation protocol: Yes   Plan:  Cont heparin infusion at 1200units/hr  Daily Heparin level & CBC   F/u plans for potential surgery.     Romeo Rabon, PharmD, pager [phone]. 05/12/2016,5:34 PM.

## 2016-05-12 NOTE — Progress Notes (Signed)
PROGRESS NOTE  Elizabeth Mcconnell NWG:[id number] DOB: [birth year] DOA: 05/09/2016 PCP: [name], MD   LOS: 3 days   Brief Narrative: Elizabeth Mcconnell is a 71 y.o. female with medical history significant of hypertension, asthma, tobacco abuse, left pneumothorax, atrial fibrillation on Eliquis, partial colectomy due to bowel obstruction 05/04/15, s/p of LLL lung lobectomy, who presents with nausea, vomiting and abdominal pain, found to have recurrent small bowel obstruction on the CT scan.  General surgery was consulted, and due to multiple medical problems she was admitted on the hospitalist service  Assessment & Plan: Principal Problem:   SBO (small bowel obstruction) Active Problems:   GAD (generalized anxiety disorder)   Asthma, moderate persistent   Essential hypertension   Atrial fibrillation with RVR (HCC)   Tobacco abuse   SBO (small bowel obstruction)  -Patient underwent a CT scan of the abdomen and pelvis in the emergency room which showed small bowel obstruction with transition point in mid pelvis -General surgery consulted, appreciate input, continue conservative management -She is improving, abdominal x-ray today showed resolving small bowel obstruction, she has been having bowel movements in the last 24 hours and passing gas.  No further nausea or vomiting. -Clamp NG tube, await surgery to see her but can likely start liquids this morning  Atrial fibrillation with RVR  -CHA2DS2-VASc Score is 3, needs oral anticoagulation.  She is on Eliquis at home, continue to hold and keep patient on heparin infusion meanwhile. -She went back into A. fib last night, rates vary between 9-120, no sustained RVR noted   Asthma, moderate persistent  -stable, no wheezing -prn Xopenex nebs -continue flovent inhaler and singular  HTN -on Cardizem -prn IV hydralazine  GAD (generalized anxiety disorder) -continue home Xanax  Tobacco abuse -Did counseling about importance of  quitting smoking -Nicotine patch  Positive UA -UA has small amount of leukocyte, but patient does not have symptoms of UTI. -f/u urine culture   DVT prophylaxis: Eliquis Code Status: Full code Family Communication: No family at bedside Disposition Plan: home when ready  Consultants:   General surgery  Procedures:   None   Antimicrobials:  None    Subjective: - she felt when she started to have A fib, no chest pain, no palpitations  Objective: Vitals:   05/11/16 2054 05/11/16 2100 05/12/16 0426 05/12/16 0754  BP: (!) 162/74 (!) 144/77 132/70   Pulse:  (!) 122 78   Resp:   18   Temp: 98.7 F (37.1 C)  98.3 F (36.8 C)   TempSrc: Oral  Oral   SpO2:   95% 97%  Weight:      Height:        Intake/Output Summary (Last 24 hours) at 05/12/16 1008 Last data filed at 05/12/16 0924  Gross per 24 hour  Intake             2360 ml  Output             2750 ml  Net             -390 ml   Filed Weights   05/10/16 2127  Weight: 71.5 kg (157 lb 10.1 oz)    Examination: Constitutional: NAD Vitals:   05/11/16 2054 05/11/16 2100 05/12/16 0426 05/12/16 0754  BP: (!) 162/74 (!) 144/77 132/70   Pulse:  (!) 122 78   Resp:   18   Temp: 98.7 F (37.1 C)  98.3 F (36.8 C)   TempSrc: Oral  Oral  SpO2:   95% 97%  Weight:      Height:       Eyes: PERRL, lids and conjunctivae normal ENMT: Mucous membranes are moist. No oropharyngeal exudates Neck: normal, supple, no masses, no thyromegaly Respiratory: clear to auscultation bilaterally, no wheezing, no crackles. Normal respiratory effort. No accessory muscle use.  Cardiovascular: Regular rate and rhythm, no murmurs / rubs / gallops. No LE edema. 2+ pedal pulses.  Abdomen: no tenderness. Bowel sounds positive.    Data Reviewed: I have personally reviewed following labs and imaging studies  CBC:  Recent Labs Lab 05/09/16 2006 05/10/16 0624 05/11/16 0640 05/12/16 0533  WBC 11.4* 8.6 6.7 8.7  HGB 16.1* 13.3 11.6*  11.4*  HCT 48.2* 41.1 36.5 34.6*  MCV 89.1 90.3 89.9 89.6  PLT 247 212 191 381   Basic Metabolic Panel:  Recent Labs Lab 05/09/16 2006 05/10/16 0624 05/11/16 0640 05/12/16 0532  NA 142 141 141  --   K 3.8 3.8 3.5  --   CL 106 111 113*  --   CO2 25 24 23   --   GLUCOSE 120* 105* 84  --   BUN 14 13 7   --   CREATININE 0.93 0.78 0.59  --   CALCIUM 9.6 8.4* 8.1*  --   MG  --   --   --  1.7   GFR: Estimated Creatinine Clearance: 63 mL/min (by C-G formula based on SCr of 0.59 mg/dL). Liver Function Tests:  Recent Labs Lab 05/09/16 2006  AST 18  ALT 17  ALKPHOS 73  BILITOT 1.1  PROT 7.8  ALBUMIN 4.8    Recent Labs Lab 05/09/16 2006  LIPASE 36   No results for input(s): AMMONIA in the last 168 hours. Coagulation Profile:  Recent Labs Lab 05/09/16 2352  INR 1.06   Cardiac Enzymes:  Recent Labs Lab 05/09/16 2352 05/10/16 0624 05/10/16 1159  TROPONINI <0.03 <0.03 <0.03   BNP (last 3 results) No results for input(s): PROBNP in the last 8760 hours. HbA1C: No results for input(s): HGBA1C in the last 72 hours. CBG:  Recent Labs Lab 05/10/16 0813 05/11/16 0814 05/12/16 0802  GLUCAP 121* 79 80   Lipid Profile: No results for input(s): CHOL, HDL, LDLCALC, TRIG, CHOLHDL, LDLDIRECT in the last 72 hours. Thyroid Function Tests:  Recent Labs  05/10/16 0624  TSH 3.249   Anemia Panel: No results for input(s): VITAMINB12, FOLATE, FERRITIN, TIBC, IRON, RETICCTPCT in the last 72 hours. Urine analysis:    Component Value Date/Time   COLORURINE AMBER (A) 05/09/2016 1941   APPEARANCEUR HAZY (A) 05/09/2016 1941   LABSPEC 1.025 05/09/2016 1941   PHURINE 7.0 05/09/2016 1941   GLUCOSEU 50 (A) 05/09/2016 1941   GLUCOSEU NEGATIVE 10/21/2014 1019   HGBUR NEGATIVE 05/09/2016 1941   BILIRUBINUR SMALL (A) 05/09/2016 1941   KETONESUR 20 (A) 05/09/2016 1941   PROTEINUR 100 (A) 05/09/2016 1941   UROBILINOGEN 1.0 10/21/2014 1019   NITRITE NEGATIVE 05/09/2016 1941     LEUKOCYTESUR SMALL (A) 05/09/2016 1941   Sepsis Labs: Invalid input(s): PROCALCITONIN, LACTICIDVEN  Recent Results (from the past 240 hour(s))  Urine culture     Status: Abnormal   Collection Time: 05/09/16  7:41 PM  Result Value Ref Range Status   Specimen Description URINE, CLEAN CATCH  Final   Special Requests NONE  Final   Culture MULTIPLE SPECIES PRESENT, SUGGEST RECOLLECTION (A)  Final   Report Status 05/11/2016 FINAL  Final      Radiology Studies: Dg  Abd Portable 1v-small Bowel Obstruction Protocol-initial, 8 Hr Delay  Result Date: 05/10/2016 CLINICAL DATA:  72 y/o F; small-bowel obstruction protocol, 8 hour delayed film. EXAM: PORTABLE ABDOMEN - 1 VIEW COMPARISON:  05/10/2016 abdomen radiograph. FINDINGS: Enteric tube tip projects over the mid stomach. Persistent small bowel obstruction. There is contrast within the colon extending to rectum. Moderate rotatory dextrocurvature of the lumbar spine is stable. IMPRESSION: Persistent small bowel obstruction. Contrast within the colon extending to rectum. Electronically Signed   By: Kristine Garbe M.D.   On: 05/10/2016 21:04   Dg Abd Portable 1v-small Bowel Protocol-position Verification  Result Date: 05/10/2016 CLINICAL DATA:  Evaluate NG tube placement. Small bowel obstruction. EXAM: PORTABLE ABDOMEN - 1 VIEW COMPARISON:  May 09, 2016 FINDINGS: The NG tube is appropriately located in the left upper quadrant. Dilated small bowel loops in the left central abdomen are again identified. IMPRESSION: The NG tube appears to be in good position. Electronically Signed   By: Dorise Bullion III M.D   On: 05/10/2016 10:36   Dg Abd Portable 2v  Result Date: 05/11/2016 CLINICAL DATA:  Abdominal distension. Follow-up small bowel obstruction. EXAM: PORTABLE ABDOMEN - 2 VIEW COMPARISON:  KUB dated 05/10/2016. FINDINGS: On today's exam, there are no dilated small bowel loops identified. Contrast again noted within the colon, now to the  level of the rectum, excluding complete small bowel obstruction. Nasogastric tube appears stable in the stomach. No evidence of free intraperitoneal air. No evidence of soft tissue mass or abnormal fluid collection. Mild dextroscoliosis of the lumbar spine. IMPRESSION: 1. No radiographic evidence of small-bowel obstruction on today's exam. No dilated small bowel loops seen. Oral contrast from previous exam is now seen throughout the colon and to the level of the rectum. 2. Nasogastric tube in the stomach. Electronically Signed   By: Franki Cabot M.D.   On: 05/11/2016 09:44   Scheduled Meds: . budesonide (PULMICORT) nebulizer solution  0.25 mg Nebulization BID  . diltiazem  300 mg Oral QHS  . dofetilide  250 mcg Oral BID  . [START ON 05/13/2016] estradiol  0.05 mg Transdermal Weekly  . fluticasone  1 spray Each Nare Daily  . levalbuterol  1.25 mg Nebulization TID  . magnesium oxide  400 mg Oral QHS  . montelukast  10 mg Oral Daily  . nicotine  21 mg Transdermal Daily  . sodium chloride flush  3 mL Intravenous Q12H  . cyanocobalamin  2,000 mcg Oral Daily   Continuous Infusions: . sodium chloride 100 mL/hr at 05/11/16 1130  . heparin 1,200 Units/hr (05/12/16 0813)   Marzetta Board, MD, PhD [facility] Pager [phone]  If 7PM-7AM, please contact night-coverage [email or web address] Password [email or web address] 05/12/2016, 10:08 AM

## 2016-05-12 NOTE — Progress Notes (Signed)
[place] for Heparin Indication: atrial fibrillation  Allergies  Allergen Reactions  . Tramadol Other (See Comments)    Reaction:  Dizziness and weakness   . Amoxicillin-Pot Clavulanate Nausea And Vomiting and Other (See Comments)    Has patient had a PCN reaction causing immediate rash, facial/tongue/throat swelling, SOB or lightheadedness with hypotension: No Has patient had a PCN reaction causing severe rash involving mucus membranes or skin necrosis: No Has patient had a PCN reaction that required hospitalization No Has patient had a PCN reaction occurring within the last 10 years: No If all of the above answers are "NO", then may proceed with Cephalosporin use.  . Flagyl [Metronidazole] Nausea And Vomiting  . Flecainide Other (See Comments)    Reaction:  Dizziness   . Omnicef [Cefdinir] Nausea And Vomiting  . Avelox [Moxifloxacin] Hives and Itching   Patient Measurements: Height: 5\' 5"  (165.1 cm) Weight: 157 lb 10.1 oz (71.5 kg) IBW/kg (Calculated) : 57 Heparin Dosing Weight: TBW  Vital Signs: Temp: 98.3 F (36.8 C) (03/11 0426) Temp Source: Oral (03/11 0426) BP: 132/70 (03/11 0426) Pulse Rate: 78 (03/11 0426)  Labs:  Recent Labs  05/09/16 2006  05/09/16 2352 05/10/16 0624 05/10/16 1159 05/10/16 2146 05/11/16 0640 05/12/16 0533  HGB 16.1*  --   --  13.3  --   --  11.6* 11.4*  HCT 48.2*  --   --  41.1  --   --  36.5 34.6*  PLT 247  --   --  212  --   --  191 180  APTT  --   < > 32  --   --  22* 98* 111*  LABPROT  --   --  13.9  --   --   --   --   --   INR  --   --  1.06  --   --   --   --   --   HEPARINUNFRC  --   --   --   --  >2.20*  --  1.04* 0.62  CREATININE 0.93  --   --  0.78  --   --  0.59  --   TROPONINI  --   --  <0.03 <0.03 <0.03  --   --   --   < > = values in this interval not displayed. Estimated Creatinine Clearance: 63 mL/min (by C-G formula based on SCr of 0.59 mg/dL).  Medical History: Past Medical  History:  Diagnosis Date  . Adenomatous polyp of colon 10/2013   f/u colo 10/2018  . Allergic rhinitis   . ALLERGIC RHINITIS   . ANXIETY    "situational" (02/14/2015)  . Arthritis    "neck, back, hands" (02/14/2015)  . Asthma   . Atrial fibrillation with RVR ([place])   . Atrial flutter ([place])    failed cardioversion 11/2012; s/p ablation 02-02-2013 by Dr Rayann Heman  . Chronic bronchitis ([place])    "I'll have it most years" (02/14/2015)  . Diverticulosis   . Endometriosis   . Essential hypertension 05/30/2014  . Oral candidiasis   . Osteopenia 08/2015   T score -1.7 FRAX 14%/4.4% overall stable from prior DEXA.  [name] Pneumothorax on left 1996   left lower lung collapse s/p resection  . Tobacco abuse    Assessment: 71 y.o.femalewith atrial fibrillation on Eliquis PTA s/p partial colectomy due to bowel obstruction 05/04/15 whopresents with nausea, vomiting and abdominal pain, found to have recurrent small bowel  obstruction on the CT scan.  Surgery consulted and recommending conservative management for now but holding apixaban in case needs surgery.  CHA2DS2-VASc Scoreis 3.  Pharmacy consulted to start heparin infusion to bridge while off apixaban.   Per patient report, last apixaban dose 3/8 at 1000.  APTT 32 seconds last night.  Baseline heparin level (anti-Xa level) >2.2 which is due to recent apixaban use.  Will monitor heparin using aPTT levels until heparin level & aPTT correlate.   Today, 05/12/2016:  Symptoms of SBO improving with NG per surgery note.   APTT slightly above desired goal range, however HL now therapeutic on 1300units/hr.   Hg low, but stable.  Pltc WNL.   No bleeding reported/documented.  Renal function at patient's baseline  Goal of Therapy:  Heparin level 0.3-0.7 Monitor platelets by anticoagulation protocol: Yes   Plan:  Decrease heparin infusion to 1200units/hr  Recheck 8hr heparin level - will monitor using heparin levels only going forward  Daily Heparin  level & CBC   F/u plans for potential surgery.     Netta Cedars, PharmD, BCPS Pager: [phone] 05/12/2016,7:30 AM.

## 2016-05-13 ENCOUNTER — Ambulatory Visit: Payer: Medicare Other | Admitting: Internal Medicine

## 2016-05-13 LAB — CBC
HCT: 32.8 % — ABNORMAL LOW (ref 36.0–46.0)
Hemoglobin: 10.9 g/dL — ABNORMAL LOW (ref 12.0–15.0)
MCH: 29.9 pg (ref 26.0–34.0)
MCHC: 33.2 g/dL (ref 30.0–36.0)
MCV: 89.9 fL (ref 78.0–100.0)
Platelets: 190 10*3/uL (ref 150–400)
RBC: 3.65 MIL/uL — ABNORMAL LOW (ref 3.87–5.11)
RDW: 14 % (ref 11.5–15.5)
WBC: 8.1 10*3/uL (ref 4.0–10.5)

## 2016-05-13 LAB — BASIC METABOLIC PANEL
Anion gap: 4 — ABNORMAL LOW (ref 5–15)
BUN: <5 mg/dL — ABNORMAL LOW (ref 6–20)
CO2: 22 mmol/L (ref 22–32)
Calcium: 7.9 mg/dL — ABNORMAL LOW (ref 8.9–10.3)
Chloride: 112 mmol/L — ABNORMAL HIGH (ref 101–111)
Creatinine, Ser: 0.64 mg/dL (ref 0.44–1.00)
GFR calc Af Amer: >60 mL/min (ref >60)
GFR calc non Af Amer: >60 mL/min (ref >60)
Glucose, Bld: 93 mg/dL (ref 65–99)
Potassium: 4 mmol/L (ref 3.5–5.1)
Sodium: 138 mmol/L (ref 135–145)

## 2016-05-13 LAB — GLUCOSE, CAPILLARY: Glucose-Capillary: 90 mg/dL (ref 65–99)

## 2016-05-13 LAB — HEPARIN LEVEL (UNFRACTIONATED): Heparin Unfractionated: 0.46 IU/mL (ref 0.30–0.70)

## 2016-05-13 MED ORDER — APIXABAN 5 MG PO TABS
5.0000 mg | ORAL_TABLET | Freq: Two times a day (BID) | ORAL | Status: DC
  Administered 2016-05-13: 5 mg via ORAL
  Filled 2016-05-13: qty 1

## 2016-05-13 NOTE — Care Management Important Message (Signed)
Important Message  Patient Details  Name: Elizabeth Mcconnell MRN: [id number] Date of Birth: [birth year]   Medicare Important Message Given:  Yes    Kerin Salen 05/13/2016, 12:11 [place] Message  Patient Details  Name: Elizabeth Mcconnell MRN: [id number] Date of Birth: 11-09-1945   Medicare Important Message Given:  Yes    Kerin Salen 05/13/2016, 12:11 PM

## 2016-05-13 NOTE — Discharge Instructions (Signed)
Follow with [name], MD in 2-3 weeks  Please get a complete blood count and chemistry panel checked by your Primary MD at your next visit, and again as instructed by your Primary MD. Please get your medications reviewed and adjusted by your Primary MD.  Please request your Primary MD to go over all Hospital Tests and Procedure/Radiological results at the follow up, please get all Hospital records sent to your Prim MD by signing hospital release before you go home.  If you had Pneumonia of Lung problems at the Hospital: Please get a 2 view Chest X ray done in 6-8 weeks after hospital discharge or sooner if instructed by your Primary MD.  If you have Congestive Heart Failure: Please call your Cardiologist or Primary MD anytime you have any of the following symptoms:  1) 3 pound weight gain in 24 hours or 5 pounds in 1 week  2) shortness of breath, with or without a dry hacking cough  3) swelling in the hands, feet or stomach  4) if you have to sleep on extra pillows at night in order to breathe  Follow cardiac low salt diet and 1.5 lit/day fluid restriction.  If you have diabetes Accuchecks 4 times/day, Once in AM empty stomach and then before each meal. Log in all results and show them to your primary doctor at your next visit. If any glucose reading is under 80 or above 300 call your primary MD immediately.  If you have Seizure/Convulsions/Epilepsy: Please do not drive, operate heavy machinery, participate in activities at heights or participate in high speed sports until you have seen by Primary MD or a Neurologist and advised to do so again.  If you had Gastrointestinal Bleeding: Please ask your Primary MD to check a complete blood count within one week of discharge or at your next visit. Your endoscopic/colonoscopic biopsies that are pending at the time of discharge, will also need to followed by your Primary MD.  Get Medicines reviewed and adjusted. Please take all your  medications with you for your next visit with your Primary MD  Please request your Primary MD to go over all hospital tests and procedure/radiological results at the follow up, please ask your Primary MD to get all Hospital records sent to his/her office.  If you experience worsening of your admission symptoms, develop shortness of breath, life threatening emergency, suicidal or homicidal thoughts you must seek medical attention immediately by calling 911 or calling your MD immediately  if symptoms less severe.  You must read complete instructions/literature along with all the possible adverse reactions/side effects for all the Medicines you take and that have been prescribed to you. Take any new Medicines after you have completely understood and accpet all the possible adverse reactions/side effects.   Do not drive or operate heavy machinery when taking Pain medications.   Do not take more than prescribed Pain, Sleep and Anxiety Medications  Special Instructions: If you have smoked or chewed Tobacco  in the last 2 yrs please stop smoking, stop any regular Alcohol  and or any Recreational drug use.  Wear Seat belts while driving.  Please note You were cared for by a hospitalist during your hospital stay. If you have any questions about your discharge medications or the care you received while you were in the hospital after you are discharged, you can call the unit and asked to speak with the hospitalist on call if the hospitalist that took care of you is not available. Once  you are discharged, your primary care physician will handle any further medical issues. Please note that NO REFILLS for any discharge medications will be authorized once you are discharged, as it is imperative that you return to your primary care physician (or establish a relationship with a primary care physician if you do not have one) for your aftercare needs so that they can reassess your need for medications and monitor your  lab values.  You can reach the hospitalist office at phone [phone] or fax [phone]   If you do not have a primary care physician, you can call [phone] for a physician referral.  Activity: As tolerated with Full fall precautions use walker/cane & assistance as needed  Diet: heart healthy  Disposition Home

## 2016-05-13 NOTE — Progress Notes (Signed)
  General Surgery [facility]  Assessment & Plan:  HD#5 - small bowel obstruction  Patient taking soft diet, no nausea or emesis, no pain  Passing flatus, BM yesterday  SBO resolved - OK for discharge from surgical standpoint - will sign off        Earnstine Regal, MD, [facility]       Office: [phone]    Subjective: Patient comfortable in bed, husband on the way to pick her up.  Tolerated breakfast without difficulty.  Passing flatus this AM.  Objective: Vital signs in last 24 hours: Temp:  [98.3 F (36.8 C)-98.6 F (37 C)] 98.3 F (36.8 C) (03/12 0553) Pulse Rate:  [73-106] 73 (03/12 0553) Resp:  [19-20] 20 (03/12 0553) BP: (117-131)/(62-81) 131/64 (03/12 0553) SpO2:  [95 %-98 %] 97 % (03/12 0553) Last BM Date: 05/12/16  Intake/Output from previous day: 03/11 0701 - 03/12 0700 In: 3495.9 [P.O.:720; I.V.:2725.9; IV Piggyback:50] Out: 1700 [Urine:1600; Emesis/NG output:100] Intake/Output this shift: No intake/output data recorded.  Physical Exam: HEENT - sclerae clear, mucous membranes moist Neck - soft Chest - clear bilaterally Cor - RRR Abdomen - soft without distension; BS present; non-tender Ext - no edema, non-tender Neuro - alert & oriented, no focal deficits  Lab Results:   Recent Labs  05/12/16 0533 05/13/16 0441  WBC 8.7 8.1  HGB 11.4* 10.9*  HCT 34.6* 32.8*  PLT 180 190   BMET  Recent Labs  05/12/16 0532 05/13/16 0441  NA 139 138  K 3.4* 4.0  CL 112* 112*  CO2 19* 22  GLUCOSE 73 93  BUN 7 <5*  CREATININE 0.56 0.64  CALCIUM 7.4* 7.9*   PT/INR No results for input(s): LABPROT, INR in the last 72 hours. Comprehensive Metabolic Panel:    Component Value Date/Time   NA 138 05/13/2016 0441   NA 139 05/12/2016 0532   K 4.0 05/13/2016 0441   K 3.4 (L) 05/12/2016 0532   CL 112 (H) 05/13/2016 0441   CL 112 (H) 05/12/2016 0532   CO2 22 05/13/2016 0441   CO2 19 (L) 05/12/2016 0532    BUN <5 (L) 05/13/2016 0441   BUN 7 05/12/2016 0532   CREATININE 0.64 05/13/2016 0441   CREATININE 0.56 05/12/2016 0532   CREATININE 0.81 05/18/2015 1502   CREATININE 0.80 02/22/2015 1527   GLUCOSE 93 05/13/2016 0441   GLUCOSE 73 05/12/2016 0532   CALCIUM 7.9 (L) 05/13/2016 0441   CALCIUM 7.4 (L) 05/12/2016 0532   AST 18 05/09/2016 2006   AST 12 07/19/2015 1512   ALT 17 05/09/2016 2006   ALT 9 07/19/2015 1512   ALKPHOS 73 05/09/2016 2006   ALKPHOS 72 07/19/2015 1512   BILITOT 1.1 05/09/2016 2006   BILITOT 0.4 07/19/2015 1512   PROT 7.8 05/09/2016 2006   PROT 7.0 07/19/2015 1512   ALBUMIN 4.8 05/09/2016 2006   ALBUMIN 4.4 07/19/2015 1512    Studies/Results: No results found.    Elizabeth Mcconnell M 05/13/2016  Patient ID: Elizabeth Mcconnell, female   DOB: 12/20/1945, 71 y.o.   MRN: [id number]

## 2016-05-13 NOTE — Discharge Summary (Signed)
Physician Discharge Summary  Elizabeth Mcconnell OMV:[id number] DOB: 1945/04/22 DOA: 05/09/2016  PCP: [name], MD  Admit date: 05/09/2016 Discharge date: 05/13/2016  Admitted From: home Disposition:  home  Recommendations for Outpatient Follow-up:  1. Follow up with PCP in 1-2 weeks  Home Health: none  Equipment/Devices: none   Discharge Condition: stable CODE STATUS: Full code Diet recommendation: heart healthy  HPI: Per Dr. Blaine Hamper, Elizabeth Mcconnell is a 71 y.o. female with medical history significant of hypertension, asthma, tobacco abuse, left pneumothorax, atrial fibrillation on Eliquis, partial colectomy due to bowel obstruction 05/04/15, s/p of LLL lung lobectomy, who presents with nausea, vomiting and abdominal pain. Patient states that her symptoms started last night. She vomited 7 times without blood in the vomitus. No diarrhea. Her abdominal pain is located in the mid-abdomen, worse on the left side. It is constant, 8 out of 10 in severity, nonradiating. It is not aggravated, but alleviated slightly after vomiting. Last bowel movement was yesterday morning. Patient does not have fever, but feeling cold. No chest pain, shortness rest. She has mild dry cough, no runny nose or sore throat. Denies symptoms of UTI or unilateral weakness. Pt was seen by PCP and sent to ED due to the concerns of bowel obstruction. ED Course: pt was found to have WBC 11.4, lipase is 36, urinalysis with small amount of leukocyte, electrolytes renal function okay, temperature normal, Afib with RVR on EKG. CT abdomen/pelvis showed small bowel obstruction with transition point in mid pelvis. Pt is admitted to SDU as inpt. Gen. surgeon (possiblly Dr. Lucia Gaskins) is consulted by Dr. Lenon Oms Course: Discharge Diagnoses:  Principal Problem:   SBO (small bowel obstruction) Active Problems:   GAD (generalized anxiety disorder)   Asthma, moderate persistent   Essential hypertension   Atrial fibrillation  with RVR (HCC)   Tobacco abuse  SBO (small bowel obstruction)  -patient underwent a CT scan of the abdomen and pelvis in the emergency room which shows small bowel obstruction with transition point in mid pelvis.  General surgery was consulted and have follow patient while hospitalized.  She was treated with conservative management, NG tube, IV fluids and n.p.o.  She improved without any need for surgical intervention, her serial abdominal x-ray showed resolution of the small bowel obstruction, she has been having bowel movements, and her diet was advanced and she was able to tolerate a regular diet.  She was discharged home in stable condition. Atrial fibrillation with RVR  -CHA2DS2-VASc Scoreis 3, needs oral anticoagulation. Patient is on Eliquis at home, this was held in the hospital In the setting of small bowel obstruction concern for needing surgery and she was maintained on heparin infusion.  She had several episodes of A. fib with RVR intermittent with sinus rhythm, perhaps due to poor absorption of the Tikosyn in the setting of small bowel obstruction.  After her bowel obstruction resolved, she was able to tolerate all her p.o.'s, she converted to sinus rhythm and has remained in sinus until discharge.  She will need to follow-up with Dr. Rayann Heman as an outpatient as scheduled Asthma, moderate persistent -stable, no wheezing HTN -on Cardizem GAD (generalized anxiety disorder) -continue home Xanax Tobacco abuse -Did counseling about importance of quitting smoking Positive UA -UA has small amount of leukocyte, but patient does not have symptoms of UTI.  Urine cultures have remained negative  Discharge Instructions   Allergies as of 05/13/2016      Reactions   Tramadol Other (See Comments)  Reaction:  Dizziness and weakness    Amoxicillin-pot Clavulanate Nausea And Vomiting, Other (See Comments)   Has patient had a PCN reaction causing immediate rash, facial/tongue/throat swelling, SOB or  lightheadedness with hypotension: No Has patient had a PCN reaction causing severe rash involving mucus membranes or skin necrosis: No Has patient had a PCN reaction that required hospitalization No Has patient had a PCN reaction occurring within the last 10 years: No If all of the above answers are "NO", then may proceed with Cephalosporin use.   Flagyl [metronidazole] Nausea And Vomiting   Flecainide Other (See Comments)   Reaction:  Dizziness    Omnicef [cefdinir] Nausea And Vomiting   Avelox [moxifloxacin] Hives, Itching      Medication List    TAKE these medications   acetaminophen 650 MG CR tablet Commonly known as:  TYLENOL Take 650-1,300 mg by mouth at bedtime as needed for pain.   ALPRAZolam 0.25 MG tablet Commonly known as:  XANAX Take 0.25 mg by mouth 2 (two) times daily as needed for anxiety.   cyanocobalamin 2000 MCG tablet Take 1 tablet (2,000 mcg total) by mouth daily.   diltiazem 30 MG tablet Commonly known as:  CARDIZEM Cardizem 30mg  -- take 1 tablet by mouth every 4 hours AS NEEDED for heart rate >100 as long as blood pressure >100. What changed:  how much to take  how to take this  when to take this  reasons to take this  additional instructions   diltiazem 300 MG 24 hr capsule Commonly known as:  CARDIZEM CD TAKE 1 CAPSULE BY MOUTH NIGHTLY AT BEDTIME What changed:  how much to take  how to take this  when to take this  additional instructions   dofetilide 250 MCG capsule Commonly known as:  TIKOSYN Take 1 capsule (250 mcg total) by mouth 2 (two) times daily.   ELIQUIS 5 MG Tabs tablet Generic drug:  apixaban TAKE 1 TABLET BY MOUTH 2 TIMES DAILY   estradiol 0.05 MG/24HR patch Commonly known as:  VIVELLE-DOT Place 1 patch (0.05 mg total) onto the skin 2 (two) times a week.   fluticasone 110 MCG/ACT inhaler Commonly known as:  FLOVENT HFA Inhale 2 puffs into the lungs 2 (two) times daily.   fluticasone 50 MCG/ACT nasal  spray Commonly known as:  FLONASE USE 2 SPRAYS IN EACH NOSTRIL 2 TIMES DAILY   magnesium oxide 400 MG tablet Commonly known as:  MAG-OX Take 1 tablet (400 mg total) by mouth daily. What changed:  when to take this   Melatonin 3 MG Tabs Take 3 mg by mouth at bedtime as needed (for sleep).   montelukast 10 MG tablet Commonly known as:  SINGULAIR Take 1 tablet (10 mg total) by mouth daily.   XOPENEX HFA 45 MCG/ACT inhaler Generic drug:  levalbuterol Inhale 1-2 puffs into the lungs every 4 (four) hours as needed for wheezing or shortness of breath.      Follow-up Information    [name], MD Follow up.   Specialty:  Internal Medicine Why:  as needed Contact information: [address] [place] [address] [phone]          Allergies  Allergen Reactions  . Tramadol Other (See Comments)    Reaction:  Dizziness and weakness   . Amoxicillin-Pot Clavulanate Nausea And Vomiting and Other (See Comments)    Has patient had a PCN reaction causing immediate rash, facial/tongue/throat swelling, SOB or lightheadedness with hypotension: No Has patient  had a PCN reaction causing severe rash involving mucus membranes or skin necrosis: No Has patient had a PCN reaction that required hospitalization No Has patient had a PCN reaction occurring within the last 10 years: No If all of the above answers are "NO", then may proceed with Cephalosporin use.  . Flagyl [Metronidazole] Nausea And Vomiting  . Flecainide Other (See Comments)    Reaction:  Dizziness   . Omnicef [Cefdinir] Nausea And Vomiting  . Avelox [Moxifloxacin] Hives and Itching    Consultations:  General surgery  Procedures/Studies:  Ct Abdomen Pelvis W Contrast  Result Date: 05/09/2016 CLINICAL DATA:  Intermittent burning abdominal pain since last evening. History of atrial fibrillation and flutter, diverticulosis, GERD and asthma. EXAM: CT ABDOMEN AND PELVIS WITH CONTRAST TECHNIQUE: Multidetector  CT imaging of the abdomen and pelvis was performed using the standard protocol following bolus administration of intravenous contrast. CONTRAST:  146mL ISOVUE-300 IOPAMIDOL (ISOVUE-300) INJECTION 61% COMPARISON:  CT from 05/01/2015 FINDINGS: Lower chest: No acute abnormality. Hepatobiliary: Mild hepatic steatosis. No biliary dilatation. Unremarkable gallbladder. No space-occupying mass of the liver. Pancreas: Unremarkable. No pancreatic ductal dilatation or surrounding inflammatory changes. Spleen: Normal in size without focal abnormality. Adrenals/Urinary Tract: Adrenal glands are unremarkable. Kidneys are normal, without renal calculi, focal lesion, or hydronephrosis. Bladder is unremarkable. Stomach/Bowel: Fluid-filled mild distention of the stomach. There is a small sliding-type hiatal hernia. A duodenal diverticulum is identified off second portion. Dilated fluid-filled loops of jejunum identified in the mid abdomen extending to a transition point in the mid pelvis, series 2, image 57. Proximal to this, there is fecalized material within distended small bowel measuring up to 3.6 cm. Rather long segmental luminal narrowing is identified with mild transmural thickening and a transition point. Some of this may be simply due to under distention of bowel. Other differential possibilities might include changes from inflammatory bowel disease or potentially an infiltrating abnormalities such as lymphoma though believed less likely given lack of other ancillary findings nor adenopathy. Postoperative adhesion is favored given the presence of adjacent chain sutures from prior partial colectomy/sigmoid resection within the pelvis. Vascular/Lymphatic: Aortoiliac atherosclerosis.  No lymphadenopathy. Reproductive: Status post hysterectomy. No adnexal masses. Other: No abdominal wall hernia or abnormality. No abdominopelvic ascites. Musculoskeletal: Levoconvex curvature of the lumbar spine. 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  Grade 1 anterolisthesis of L4 on L5 secondary to facet arthropathy. IMPRESSION: 1. There is small bowel obstruction believed secondary to an adhesion in the mid pelvis given adjacent changes from prior partial colectomy. Obstruction secondary to changes from inflammatory bowel or an infiltrative neoplasm are believed less likely. 2. Mild hepatic steatosis. 3. Hysterectomy.  No adnexal mass. 4. Aortoiliac atherosclerosis. Electronically Signed   By: Ashley Royalty M.D.   On: 05/09/2016 21:50   Dg Abd Portable 1v-small Bowel Obstruction Protocol-initial, 8 Hr Delay  Result Date: 05/10/2016 CLINICAL DATA:  71 y/o F; small-bowel obstruction protocol, 8 hour delayed film. EXAM: PORTABLE ABDOMEN - 1 VIEW COMPARISON:  05/10/2016 abdomen radiograph. FINDINGS: Enteric tube tip projects over the mid stomach. Persistent small bowel obstruction. There is contrast within the colon extending to rectum. Moderate rotatory dextrocurvature of the lumbar spine is stable. IMPRESSION: Persistent small bowel obstruction. Contrast within the colon extending to rectum. Electronically Signed   By: Kristine Garbe M.D.   On: 05/10/2016 21:04   Dg Abd Portable 1v-small Bowel Protocol-position Verification  Result Date: 05/10/2016 CLINICAL DATA:  Evaluate NG tube placement. Small bowel obstruction. EXAM: PORTABLE ABDOMEN - 1 VIEW COMPARISON:  May 09, 2016 FINDINGS: The NG tube is appropriately located in the left upper quadrant. Dilated small bowel loops in the left central abdomen are again identified. IMPRESSION: The NG tube appears to be in good position. Electronically Signed   By: Dorise Bullion III M.D   On: 05/10/2016 10:36   Dg Abd Portable 2v  Result Date: 05/11/2016 CLINICAL DATA:  Abdominal distension. Follow-up small bowel obstruction. EXAM: PORTABLE ABDOMEN - 2 VIEW COMPARISON:  KUB dated 05/10/2016. FINDINGS: On today's exam, there are no dilated small bowel loops identified. Contrast again noted within the  colon, now to the level of the rectum, excluding complete small bowel obstruction. Nasogastric tube appears stable in the stomach. No evidence of free intraperitoneal air. No evidence of soft tissue mass or abnormal fluid collection. Mild dextroscoliosis of the lumbar spine. IMPRESSION: 1. No radiographic evidence of small-bowel obstruction on today's exam. No dilated small bowel loops seen. Oral contrast from previous exam is now seen throughout the colon and to the level of the rectum. 2. Nasogastric tube in the stomach. Electronically Signed   By: Franki Cabot M.D.   On: 05/11/2016 09:44      Subjective: - no chest pain, shortness of breath, no abdominal pain, nausea or vomiting.   Discharge Exam: Vitals:   05/12/16 2017 05/13/16 0553  BP: 117/62 131/64  Pulse: 76 73  Resp: 20 20  Temp: 98.4 F (36.9 C) 98.3 F (36.8 C)   Vitals:   05/12/16 1916 05/12/16 2017 05/12/16 2329 05/13/16 0553  BP:  117/62  131/64  Pulse:  76  73  Resp:  20  20  Temp:  98.4 F (36.9 C)  98.3 F (36.8 C)  TempSrc:  Oral  Oral  SpO2: 96% 96% 98% 97%  Weight:      Height:        General: Pt is alert, awake, not in acute distress Cardiovascular: RRR, S1/S2 +, no rubs, no gallops Respiratory: CTA bilaterally, no wheezing, no rhonchi Abdominal: Soft, NT, ND, bowel sounds + Extremities: no edema, no cyanosis    The results of significant diagnostics from this hospitalization (including imaging, microbiology, ancillary and laboratory) are listed below for reference.     Microbiology: Recent Results (from the past 240 hour(s))  Urine culture     Status: Abnormal   Collection Time: 05/09/16  7:41 PM  Result Value Ref Range Status   Specimen Description URINE, CLEAN CATCH  Final   Special Requests NONE  Final   Culture MULTIPLE SPECIES PRESENT, SUGGEST RECOLLECTION (A)  Final   Report Status 05/11/2016 FINAL  Final     Labs: BNP (last 3 results) No results for input(s): BNP in the last 8760  hours. Basic Metabolic Panel:  Recent Labs Lab 05/09/16 2006 05/10/16 0624 05/11/16 0640 05/12/16 0532 05/13/16 0441  NA 142 141 141 139 138  K 3.8 3.8 3.5 3.4* 4.0  CL 106 111 113* 112* 112*  CO2 25 24 23  19* 22  GLUCOSE 120* 105* 84 73 93  BUN 14 13 7 7  <5*  CREATININE 0.93 0.78 0.59 0.56 0.64  CALCIUM 9.6 8.4* 8.1* 7.4* 7.9*  MG  --   --   --  1.7  --    Liver Function Tests:  Recent Labs Lab 05/09/16 2006  AST 18  ALT 17  ALKPHOS 73  BILITOT 1.1  PROT 7.8  ALBUMIN 4.8    Recent Labs Lab 05/09/16 2006  LIPASE 36   No results for input(s):  AMMONIA in the last 168 hours. CBC:  Recent Labs Lab 05/09/16 2006 05/10/16 4166 05/11/16 0640 05/12/16 0533 05/13/16 0441  WBC 11.4* 8.6 6.7 8.7 8.1  HGB 16.1* 13.3 11.6* 11.4* 10.9*  HCT 48.2* 41.1 36.5 34.6* 32.8*  MCV 89.1 90.3 89.9 89.6 89.9  PLT 247 212 191 180 190   Cardiac Enzymes:  Recent Labs Lab 05/09/16 2352 05/10/16 0624 05/10/16 1159  TROPONINI <0.03 <0.03 <0.03   BNP: Invalid input(s): POCBNP CBG:  Recent Labs Lab 05/10/16 0813 05/11/16 0814 05/12/16 0802 05/13/16 0742  GLUCAP 121* 79 80 90   D-Dimer No results for input(s): DDIMER in the last 72 hours. Hgb A1c No results for input(s): HGBA1C in the last 72 hours. Lipid Profile No results for input(s): CHOL, HDL, LDLCALC, TRIG, CHOLHDL, LDLDIRECT in the last 72 hours. Thyroid function studies No results for input(s): TSH, T4TOTAL, T3FREE, THYROIDAB in the last 72 hours.  Invalid input(s): FREET3 Anemia work up No results for input(s): VITAMINB12, FOLATE, FERRITIN, TIBC, IRON, RETICCTPCT in the last 72 hours. Urinalysis    Component Value Date/Time   COLORURINE AMBER (A) 05/09/2016 1941   APPEARANCEUR HAZY (A) 05/09/2016 1941   LABSPEC 1.025 05/09/2016 1941   PHURINE 7.0 05/09/2016 1941   GLUCOSEU 50 (A) 05/09/2016 1941   GLUCOSEU NEGATIVE 10/21/2014 1019   HGBUR NEGATIVE 05/09/2016 1941   BILIRUBINUR SMALL (A)  05/09/2016 1941   KETONESUR 20 (A) 05/09/2016 1941   PROTEINUR 100 (A) 05/09/2016 1941   UROBILINOGEN 1.0 10/21/2014 1019   NITRITE NEGATIVE 05/09/2016 1941   LEUKOCYTESUR SMALL (A) 05/09/2016 1941   Sepsis Labs Invalid input(s): PROCALCITONIN,  WBC,  LACTICIDVEN Microbiology Recent Results (from the past 240 hour(s))  Urine culture     Status: Abnormal   Collection Time: 05/09/16  7:41 PM  Result Value Ref Range Status   Specimen Description URINE, CLEAN CATCH  Final   Special Requests NONE  Final   Culture MULTIPLE SPECIES PRESENT, SUGGEST RECOLLECTION (A)  Final   Report Status 05/11/2016 FINAL  Final     Time coordinating discharge: 40 minutes  SIGNED:  Marzetta Board, MD  [facility] 05/13/2016, 12:57 PM Pager [phone]  If 7PM-7AM, please contact night-coverage [email or web address] Password [email or web address]

## 2016-05-13 NOTE — Progress Notes (Addendum)
Elizabeth Mcconnell for Heparin Indication: atrial fibrillation  Allergies  Allergen Reactions  . Tramadol Other (See Comments)    Reaction:  Dizziness and weakness   . Amoxicillin-Pot Clavulanate Nausea And Vomiting and Other (See Comments)    Has patient had a PCN reaction causing immediate rash, facial/tongue/throat swelling, SOB or lightheadedness with hypotension: No Has patient had a PCN reaction causing severe rash involving mucus membranes or skin necrosis: No Has patient had a PCN reaction that required hospitalization No Has patient had a PCN reaction occurring within the last 10 years: No If all of the above answers are "NO", then may proceed with Cephalosporin use.  . Flagyl [Metronidazole] Nausea And Vomiting  . Flecainide Other (See Comments)    Reaction:  Dizziness   . Omnicef [Cefdinir] Nausea And Vomiting  . Avelox [Moxifloxacin] Hives and Itching   Patient Measurements: Height: 5\' 5"  (165.1 cm) Weight: 157 lb 10.1 oz (71.5 kg) IBW/kg (Calculated) : 57 Heparin Dosing Weight: TBW  Vital Signs: Temp: 98.3 F (36.8 C) (03/12 0553) Temp Source: Oral (03/12 0553) BP: 131/64 (03/12 0553) Pulse Rate: 73 (03/12 0553)  Labs:  Recent Labs  05/10/16 1159 05/10/16 2146  05/11/16 0640 05/12/16 0532 05/12/16 0533 05/12/16 1702 05/13/16 0441  HGB  --   --   < > 11.6*  --  11.4*  --  10.9*  HCT  --   --   --  36.5  --  34.6*  --  32.8*  PLT  --   --   --  191  --  180  --  190  APTT  --  22*  --  98*  --  111*  --   --   HEPARINUNFRC >2.20*  --   --  1.04*  --  0.62 0.59 0.46  CREATININE  --   --   --  0.59 0.56  --   --   --   TROPONINI <0.03  --   --   --   --   --   --   --   < > = values in this interval not displayed. Estimated Creatinine Clearance: 63 mL/min (by C-G formula based on SCr of 0.56 mg/dL).  Medical History: Past Medical History:  Diagnosis Date  . Adenomatous polyp of colon 10/2013   f/u colo 10/2018  . Allergic  rhinitis   . ALLERGIC RHINITIS   . ANXIETY    "situational" (02/14/2015)  . Arthritis    "neck, back, hands" (02/14/2015)  . Asthma   . Atrial fibrillation with RVR ([place])   . Atrial flutter ([place])    failed cardioversion 11/2012; s/p ablation 02-02-2013 by Dr Rayann Heman  . Chronic bronchitis ([place])    "I'll have it most years" (02/14/2015)  . Diverticulosis   . Endometriosis   . Essential hypertension 05/30/2014  . Oral candidiasis   . Osteopenia 08/2015   T score -1.7 FRAX 14%/4.4% overall stable from prior DEXA.  [name] Pneumothorax on left 1996   left lower lung collapse s/p resection  . Tobacco abuse    Assessment: 71 y.o.femalewith atrial fibrillation on Eliquis PTA s/p partial colectomy due to bowel obstruction 05/04/15 whopresents with nausea, vomiting and abdominal pain, found to have recurrent small bowel obstruction on the CT scan.  Surgery consulted and recommending conservative management for now but holding apixaban in case needs surgery.  CHA2DS2-VASc Scoreis 3.  Pharmacy consulted to start heparin infusion to bridge while off apixaban.  Per patient report, last apixaban dose 3/8 at 1000.  APTT 32 seconds last night.  Baseline heparin level (anti-Xa level) >2.2 which is due to recent apixaban use.     Today, 05/13/2016:  Symptoms of SBO resolving per surgery note.   Heparin therapeutic on 1200 units/hr   Hg low, but stable.  Pltc WNL.   No bleeding reported/documented.  Renal function at patient's baseline  Goal of Therapy:  Heparin level 0.3-0.7 Monitor platelets by anticoagulation protocol: Yes   Plan:  continue heparin infusion at 1200units/hr  Daily Heparin level & CBC   F/u plans for resuming apixaban   Elizabeth Mcconnell RPh 05/13/2016, 7:45 AM Pager [phone]  Pharmacy consulted to resume apixaban.  Plan: Stop heparin drip and give apixaban 5mg  po twice daily Stop heparin labs Follow renal function  Elizabeth Mcconnell RPh 05/13/2016, 8:18 AM Pager  [phone]

## 2016-05-17 ENCOUNTER — Ambulatory Visit (HOSPITAL_COMMUNITY)
Admission: RE | Admit: 2016-05-17 | Discharge: 2016-05-17 | Disposition: A | Discharge status: Home / Self Care | Payer: Medicare Other | Source: Ambulatory Visit | Attending: Nurse Practitioner | Admitting: Nurse Practitioner

## 2016-05-17 ENCOUNTER — Encounter (HOSPITAL_COMMUNITY): Payer: Self-pay | Admitting: Nurse Practitioner

## 2016-05-17 DIAGNOSIS — I4510 Unspecified right bundle-branch block: Secondary | ICD-10-CM | POA: Diagnosis not present

## 2016-05-17 DIAGNOSIS — I4891 Unspecified atrial fibrillation: Secondary | ICD-10-CM | POA: Diagnosis present

## 2016-05-17 NOTE — Progress Notes (Addendum)
Pt in for repeat EKG today.  Pt states she is feeling well.  EKG showing NSR HR 80. BP 122/76.  EKG to be reviewed by Roderic Palau, NP  Pt recently in hospital for SBO, Tikosyn was continued while she had a gastric tube and it did release without surgery. She had some afib in the hosptial. Today, she has been doing well since d/c. No noted afib. Ekg shows NSR at 80 bpm, pr int 180 ms, qrs int 96 ms,s qtc 461 ms. Bmet/mag drawn today.

## 2016-05-22 ENCOUNTER — Telehealth: Payer: Self-pay | Admitting: Internal Medicine

## 2016-05-22 NOTE — Telephone Encounter (Signed)
New message   Pt is calling stating her pharmacy was faxing over some paper work for Dr. Rayann Heman. She says they increased the co-pay of her medication.

## 2016-05-22 NOTE — Telephone Encounter (Signed)
Calling in about Tikosyn cost.  She is on the generic formulary. Medication has gone from tier 1 to tier 3 now.  Copay was $24, not $128. She wanted [place] to get started on tier exception request b/c she cannot afford $128 monthly. She understands Claiborne Billings is in [place] this week and it may be next week before she hears from our office in relation to this request. Forwarding to Claiborne Billings (Pensions consultant) and Jeani Hawking in prior [place] department.

## 2016-05-23 ENCOUNTER — Telehealth: Payer: Self-pay | Admitting: Internal Medicine

## 2016-05-23 NOTE — Telephone Encounter (Signed)
yes

## 2016-05-23 NOTE — Telephone Encounter (Signed)
Pt was just released from the hospital for a SBO. States she was told to follow-up within a couple of weeks and really wants to see Dr. Hilarie Fredrickson. There is a held banding slot on 4/4@3 :45pm. Is it ok to schedule her in this slot? Please advise.

## 2016-05-23 NOTE — Telephone Encounter (Signed)
**Note De-Identified  Obfuscation** The pt states that she has talked with her insurance company [facility]) and that they are going to be faxing a tier exception form to [place].  Awaiting fax.

## 2016-05-23 NOTE — Telephone Encounter (Signed)
Pt scheduled for 06/05/16@3 :45pm, pt aware.

## 2016-05-24 ENCOUNTER — Encounter: Payer: Self-pay | Admitting: *Deleted

## 2016-05-28 ENCOUNTER — Telehealth: Payer: Self-pay

## 2016-05-28 NOTE — Telephone Encounter (Signed)
Tier cost sharing exception for Dofetilide 274mcg faxed to [employer].

## 2016-05-29 ENCOUNTER — Telehealth: Payer: Self-pay | Admitting: Internal Medicine

## 2016-05-29 NOTE — Telephone Encounter (Signed)
Helene Kelp from [employer] calling in regards to the Tier Exceptino Request and would like to verify "why the patient cannot use medications for tier 2?" Please call to discuss, thanks.

## 2016-06-05 ENCOUNTER — Ambulatory Visit (INDEPENDENT_AMBULATORY_CARE_PROVIDER_SITE_OTHER): Payer: Medicare Other | Admitting: Internal Medicine

## 2016-06-05 ENCOUNTER — Encounter: Payer: Self-pay | Admitting: Internal Medicine

## 2016-06-05 VITALS — BP 118/78 | HR 88 | Resp 18 | Ht 65.0 in | Wt 157.0 lb

## 2016-06-05 DIAGNOSIS — K660 Peritoneal adhesions (postprocedural) (postinfection): Secondary | ICD-10-CM | POA: Diagnosis not present

## 2016-06-05 DIAGNOSIS — R194 Change in bowel habit: Secondary | ICD-10-CM | POA: Diagnosis not present

## 2016-06-05 DIAGNOSIS — R198 Other specified symptoms and signs involving the digestive system and abdomen: Secondary | ICD-10-CM

## 2016-06-05 DIAGNOSIS — K219 Gastro-esophageal reflux disease without esophagitis: Secondary | ICD-10-CM

## 2016-06-05 DIAGNOSIS — K56609 Unspecified intestinal obstruction, unspecified as to partial versus complete obstruction: Secondary | ICD-10-CM

## 2016-06-05 NOTE — Patient Instructions (Addendum)
If you are age 73 or older, your body mass index should be between 23-30. Your Body mass index is 26.13 kg/m. If this is out of the aforementioned range listed, please consider follow up with your Primary Care Provider.  If you are age 64 or younger, your body mass index should be between 19-25. Your Body mass index is 26.13 kg/m. If this is out of the aformentioned range listed, please consider follow up with your Primary Care Provider.   Start Benefiber 1 tablespoon daily.  Start Zantac 150 mg (over the counter) twice daily as needed.  We are referring you to [facility] - Dr. Johney Maine for small bowel obstruction and abdominal adhesions.  We will contact you when we hear back from their office.  Follow up in  2-3 months,  August 05, 2016 at 330 pm.  Thank you for choosing me and [facility].  Zenovia Jarred, MD

## 2016-06-05 NOTE — Progress Notes (Signed)
Subjective:    Patient ID: Elizabeth Mcconnell, female    DOB: 1945-11-20, 71 y.o.   MRN: [id number]  HPI Elizabeth Mcconnell is a 71 yo female with PMH of severe diverticular disease with history of diverticulitis, constipation, adenomatous colon polyp, atrial fibrillation on Eliquis who is seen in hospital follow-up after small bowel obstruction.  Since last seeing me in February 2017 she underwent surgery Dr. Neysa Bonito in March 2017.  This involved a laparoscopic low anterior resection, laparoscopic LOA and splenic flexure mobilization. She recovered from this beautifully and was very happy. Her left lower quadrant abdominal pain, alternating bowel habits at all improved. She had no evidence of recurrent diverticulitis. Pathology was benign but did show chronic inactive inflammation felt secondary to diverticulosis/diverticulitis.  However she developed abdominal pain, nausea and vomiting progressed to severe. She went to the hospital where CT scan showed small bowel obstruction felt secondary to adhesive disease. This was treated conservatively with bowel rest and NG tube decompression. Her atrial fibrillation did become an issue during her hospitalization. She did not require surgery. [name] also had more issues with heartburn since her bowel obstruction.  She has been using Zantac 150 milligrams on an as-needed basis which has tremendously helped her heartburn. [name] not needed it recently and has had very little heartburn this week. She denies dysphagia and odynophagia. Bowel habits have not yet returned to normal for her. Prior to the obstruction she was having 1-2 mostly formed stools daily. Since this time she will have smaller more incomplete stool and at times looser stools. No pure diarrhea. Mild abdominal cramping but not pain which is intermittent and becoming less frequent.  She is worried about future obstructions.  [name] had no rectal bleeding or melena.   Review of  Systems As per history of present illness, otherwise negative  Current Medications, Allergies, Past Medical History, Past Surgical History, Family History and Social History were reviewed in [employer] record.     Objective:   Physical Exam BP 118/78   Pulse 88   Resp 18   Ht 5\' 5"  (1.651 m)   Wt 157 lb (71.2 kg)   BMI 26.13 kg/m  Constitutional: Well-developed and well-nourished. No distress. HEENT: Normocephalic and atraumatic. [name] Conjunctivae are normal.  No scleral icterus. Neck: Neck supple. Trachea midline. Cardiovascular: Irregularly irregular Pulmonary/chest: Effort normal and breath sounds normal. No wheezing, rales or rhonchi. Abdominal: Soft, nontender, nondistended. Bowel sounds active throughout. T Extremities: no clubbing, cyanosis, or edema Neurological: Alert and oriented to person place and time. Skin: Skin is warm and dry.  Psychiatric: Normal mood and affect. Behavior is normal.  CBC    Component Value Date/Time   WBC 8.1 05/13/2016 0441   RBC 3.65 (L) 05/13/2016 0441   HGB 10.9 (L) 05/13/2016 0441   HCT 32.8 (L) 05/13/2016 0441   PLT 190 05/13/2016 0441   MCV 89.9 05/13/2016 0441   MCH 29.9 05/13/2016 0441   MCHC 33.2 05/13/2016 0441   RDW 14.0 05/13/2016 0441   LYMPHSABS 2.1 07/19/2015 1512   MONOABS 0.7 07/19/2015 1512   EOSABS 0.1 07/19/2015 1512   BASOSABS 0.0 07/19/2015 1512   CMP     Component Value Date/Time   NA 138 05/13/2016 0441   K 4.0 05/13/2016 0441   CL 112 (H) 05/13/2016 0441   CO2 22 05/13/2016 0441   GLUCOSE 93 05/13/2016 0441   BUN <5 (L) 05/13/2016 0441   CREATININE 0.64 05/13/2016 0441  CREATININE 0.81 05/18/2015 1502   CALCIUM 7.9 (L) 05/13/2016 0441   PROT 7.8 05/09/2016 2006   ALBUMIN 4.8 05/09/2016 2006   AST 18 05/09/2016 2006   ALT 17 05/09/2016 2006   ALKPHOS 73 05/09/2016 2006   BILITOT 1.1 05/09/2016 2006   GFRNONAA >60 05/13/2016 0441   GFRAA >60 05/13/2016 0441   CT abdomen and  pelvis dated 05/09/2016 reviewed. Small bowel obstruction believed secondary to adhesive disease in the mid pelvis adjacent changes from prior partial colectomy. Mild hepatic steatosis. Hysterectomy. No adnexal mass. Aortoiliac atherosclerosis    Assessment & Plan:  71 yo female with PMH of severe diverticular disease with history of diverticulitis, constipation, adenomatous colon polyp, atrial fibrillation on Eliquis who is seen in hospital follow-up after small bowel obstruction.  1. SBO -- Mechanical small bowel obstruction from adhesive disease. We discussed that there is no great way to prevent this from happening again. I have tried to provide reassurance that she is less anxious about recurrence. After discussion she would feel better discussing this with Dr. Johney Maine. Her question is, should she have further lysis of adhesions to prevent this again? I will help facilitate a follow-up appointment with Dr. Johney Maine. She was very pleased with his care when he performed her low anterior resection for diverticulitis/diverticulosis  2. Altered bowel habit -- likely related to recent obstruction and prior left colon surgery. I recommended she begin Benefiber 1 tablespoon a day to help even out bowel movements. She is asked to call me if bowel habits remain irregular. She voices understanding  3. GERD -- intermittent without alarm symptoms. Continue Zantac 150 milligrams over-the-counter twice a day when necessary  4. Atrial fibrillation -- on Eliquis and antiarrhythmic. She has follow-up with Dr. Rayann Heman 1 week from Monday.  Return in 2-3 months, sooner if necessary 25 minutes spent with the patient today. Greater than 50% was spent in counseling and coordination of care with the patient

## 2016-06-06 ENCOUNTER — Telehealth: Payer: Self-pay | Admitting: *Deleted

## 2016-06-06 ENCOUNTER — Telehealth: Payer: Self-pay

## 2016-06-06 NOTE — Telephone Encounter (Signed)
Pt scheduled to see Dr. Johney Maine 07/08/16@10 :Alda Lea with [facility] to notify pt of appt.

## 2016-06-06 NOTE — Telephone Encounter (Signed)
Patient is scheduled for appointment at [facility] with Dr Johney Maine on 07/08/16 @ 10:30 am (follow up appointment as patient is already established). Per [facility], this patient has already been advised of appointment.

## 2016-06-10 ENCOUNTER — Ambulatory Visit: Payer: Medicare Other | Admitting: Pulmonary Disease

## 2016-06-13 ENCOUNTER — Telehealth: Payer: Self-pay | Admitting: Pulmonary Disease

## 2016-06-13 NOTE — Telephone Encounter (Signed)
Spoke with patient and informed her JN would like a follow up xray prior to appointment. Pt was asked to be here at 1030 to have xray performed. She verbalized understanding. Nothing further is needed.

## 2016-06-14 ENCOUNTER — Ambulatory Visit (INDEPENDENT_AMBULATORY_CARE_PROVIDER_SITE_OTHER)
Admission: RE | Admit: 2016-06-14 | Discharge: 2016-06-14 | Disposition: A | Discharge status: Home / Self Care | Payer: Medicare Other | Source: Ambulatory Visit | Attending: Pulmonary Disease | Admitting: Pulmonary Disease

## 2016-06-14 ENCOUNTER — Ambulatory Visit (INDEPENDENT_AMBULATORY_CARE_PROVIDER_SITE_OTHER): Payer: Medicare Other | Admitting: Pulmonary Disease

## 2016-06-14 ENCOUNTER — Encounter: Payer: Self-pay | Admitting: Pulmonary Disease

## 2016-06-14 VITALS — BP 140/90 | HR 73 | Ht 65.0 in | Wt 157.0 lb

## 2016-06-14 DIAGNOSIS — J309 Allergic rhinitis, unspecified: Secondary | ICD-10-CM

## 2016-06-14 DIAGNOSIS — F17200 Nicotine dependence, unspecified, uncomplicated: Secondary | ICD-10-CM

## 2016-06-14 DIAGNOSIS — J4540 Moderate persistent asthma, uncomplicated: Secondary | ICD-10-CM | POA: Diagnosis not present

## 2016-06-14 DIAGNOSIS — J181 Lobar pneumonia, unspecified organism: Secondary | ICD-10-CM | POA: Diagnosis not present

## 2016-06-14 DIAGNOSIS — J189 Pneumonia, unspecified organism: Secondary | ICD-10-CM

## 2016-06-14 DIAGNOSIS — F17210 Nicotine dependence, cigarettes, uncomplicated: Secondary | ICD-10-CM

## 2016-06-14 NOTE — Patient Instructions (Signed)
   Continue taking your medications as prescribed.  Call me if you have any new breathing problems before your next appointment.  I will see you back in 6 months or sooner if needed.

## 2016-06-14 NOTE — Progress Notes (Signed)
Subjective:    Patient ID: Elizabeth Mcconnell, female    DOB: 1946/01/25, 71 y.o.   MRN: [id number]  C.C.:  Follow-up for right Middle Lobe Community Acquired Pneumonia, Moderate, Persistent Asthma, Chronic Allergic Rhinitis, & Tobacco Use Disorder.  HPI  Right middle lobe community-acquired pneumonia: Seen on imaging in January 2018. Treated with doxycycline for 10 days. Patient's right middle lobe opacity seems to resolve on repeat imaging today.  Moderate persistent asthma: Prescribed Flovent & Singulair. She reports no dyspnea recently. She reports infrequent need for her Xopenex inhaler. No exacerbations since last appointment. Rare, intermittent cough that is mostly in the morning & is producing a minimal mucus.   Chronic allergic rhinitis: Prescribed Flonase and Singulair. She reports minimal sinus congestion or pressure. No sinus drainage.   Tobacco use disorder: Previously was smoking only intermittently. Continues to smoke intermittently during times of high stress. Has not tried nicotine replacement.  Review of Systems No chest pain or pressure. No fever or chills. No abdominal pain or nausea.   Allergies  Allergen Reactions  . Tramadol Other (See Comments)    Reaction:  Dizziness and weakness   . Amoxicillin-Pot Clavulanate Nausea And Vomiting and Other (See Comments)    Has patient had a PCN reaction causing immediate rash, facial/tongue/throat swelling, SOB or lightheadedness with hypotension: No Has patient had a PCN reaction causing severe rash involving mucus membranes or skin necrosis: No Has patient had a PCN reaction that required hospitalization No Has patient had a PCN reaction occurring within the last 10 years: No If all of the above answers are "NO", then may proceed with Cephalosporin use.  . Flagyl [Metronidazole] Nausea And Vomiting  . Flecainide Other (See Comments)    Reaction:  Dizziness   . Omnicef [Cefdinir] Nausea And Vomiting  . Avelox  [Moxifloxacin] Hives and Itching    Current Outpatient Prescriptions on File Prior to Visit  Medication Sig Dispense Refill  . acetaminophen (TYLENOL) 650 MG CR tablet Take 650-1,300 mg by mouth at bedtime as needed for pain.     [name] ALPRAZolam (XANAX) 0.25 MG tablet Take 0.25 mg by mouth 2 (two) times daily as needed for anxiety.     . cyanocobalamin 2000 MCG tablet Take 1 tablet (2,000 mcg total) by mouth daily. 90 tablet 3  . diltiazem (CARDIZEM CD) 300 MG 24 hr capsule TAKE 1 CAPSULE BY MOUTH NIGHTLY AT BEDTIME (Patient taking differently: Take 300 mg by mouth at bedtime. ) 30 capsule 11  . diltiazem (CARDIZEM) 30 MG tablet Cardizem 30mg  -- take 1 tablet by mouth every 4 hours AS NEEDED for heart rate >100 as long as blood pressure >100. (Patient taking differently: Take 30 mg by mouth every 4 (four) hours as needed (for heart rate > 100 as long as BP is >100). ) 45 tablet 2  . dofetilide (TIKOSYN) 250 MCG capsule Take 1 capsule (250 mcg total) by mouth 2 (two) times daily. 14 capsule 0  . ELIQUIS 5 MG TABS tablet TAKE 1 TABLET BY MOUTH 2 TIMES DAILY 180 tablet 2  . estradiol (VIVELLE-DOT) 0.05 MG/24HR patch Place 1 patch (0.05 mg total) onto the skin 2 (two) times a week. 8 patch 12  . fluticasone (FLONASE) 50 MCG/ACT nasal spray USE 2 SPRAYS IN EACH NOSTRIL 2 TIMES DAILY 16 g 2  . fluticasone (FLOVENT HFA) 110 MCG/ACT inhaler Inhale 2 puffs into the lungs 2 (two) times daily. 1 Inhaler 12  . levalbuterol (XOPENEX HFA) 45 MCG/ACT inhaler  Inhale 1-2 puffs into the lungs every 4 (four) hours as needed for wheezing or shortness of breath.    . magnesium oxide (MAG-OX) 400 MG tablet Take 1 tablet (400 mg total) by mouth daily. (Patient taking differently: Take 400 mg by mouth at bedtime. ) 30 tablet 6  . Melatonin 3 MG TABS Take 3 mg by mouth at bedtime as needed (for sleep).     . montelukast (SINGULAIR) 10 MG tablet Take 1 tablet (10 mg total) by mouth daily. 30 tablet 5   No current  facility-administered medications on file prior to visit.     Past Medical History:  Diagnosis Date  . Adenomatous polyp of colon 10/2013   f/u colo 10/2018  . Allergic rhinitis   . ALLERGIC RHINITIS   . ANXIETY    "situational" (02/14/2015)  . Arthritis    "neck, back, hands" (02/14/2015)  . Asthma   . Atrial fibrillation with RVR ([place])   . Atrial flutter ([place])    failed cardioversion 11/2012; s/p ablation 02-02-2013 by Dr Rayann Heman  . Chronic bronchitis ([place])    "I'll have it most years" (02/14/2015)  . Diverticulosis   . Endometriosis   . Essential hypertension 05/30/2014  . Hepatic steatosis   . Oral candidiasis   . Osteopenia 08/2015   T score -1.7 FRAX 14%/4.4% overall stable from prior DEXA.  [name] Pneumothorax on left 1996   left lower lung collapse s/p resection  . Small bowel obstruction   . Tobacco abuse     Past Surgical History:  Procedure Laterality Date  . ABDOMINAL HYSTERECTOMY    . APPENDECTOMY  1978  . ATRIAL FLUTTER ABLATION N/A 02/02/2013   Procedure: ATRIAL FLUTTER ABLATION;  Surgeon: Coralyn Mark, MD;  Location: [facility];  Service: Cardiovascular;  Laterality: N/A;  . CARDIOVERSION N/A 11/27/2012   Procedure: CARDIOVERSION;  Surgeon: Thayer Headings, MD;  Location: [facility];  Service: Cardiovascular;  Laterality: N/A;  . [place]; 1978  . LAPAROSCOPIC PARTIAL COLECTOMY N/A 05/04/2015   Procedure: LAPAROSCOPIC LOW ANTERIOR RESECTION LAPAROSCOPIC LYSIS OF ADHESIONS, SPLENIC FLEXURE MOBILIZATION;  Surgeon: Michael Boston, MD;  Location: [facility];  Service: General;  Laterality: N/A;  . LUNG REMOVAL, PARTIAL Left 02/1965   LLL  . TOTAL ABDOMINAL HYSTERECTOMY W/ BILATERAL SALPINGOOPHORECTOMY  1984   Endometriosis    Family History  Problem Relation Age of Onset  . Asthma Maternal Grandfather   . Stroke Father 58  . Hypertension Father   . Heart disease Father   . Diabetes Mother   . Asthma Child   . Colon cancer Neg Hx   . Rectal cancer Neg  Hx   . Stomach cancer Neg Hx     Social History   Social History  . Marital status: Married    Spouse name: N/A  . Number of children: 2  . Years of education: N/A   Occupational History  . 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  [place]   Social History Main Topics  . Smoking status: Current Some Day Smoker    Packs/day: 0.25    Years: 43.00    Types: Cigarettes  . Smokeless tobacco: Never Used  . Alcohol use 8.4 oz/week    14 Standard drinks or equivalent per week     Comment: daily wine  . Drug use: No  . Sexual activity: Yes    Birth control/ protection: Surgical, Post-menopausal     Comment: HYST-1st intercourse 71 yo-Fewer  than 5 partners   Other Topics Concern  . None   Social History Narrative   Lives with spouse;    2 children      [facility]:   She is from [place]. 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 Previously has attended business school. Previously did office work and Programmer, applications at [employer]. She has also worked for [employer]. She does have a cat currently. She has indoor plants. No mold exposure.      Objective:   Physical Exam BP 140/90 (BP Location: Left Arm, Patient Position: Sitting, Cuff Size: Normal)   Pulse 73   Ht 5\' 5"  (1.651 m)   Wt 157 lb (71.2 kg)   SpO2 95%   BMI 26.13 kg/m   Gen.: No distress. Comfortable. Alert. Integument: No rash or bruising on exposed skin. Warm and dry. HEENT: Minimal nasal turbinate swelling bilaterally. No oral ulcers. Moist mucous membranes. Pulmonary: Persistent coarse wheeze bilaterally. Good aeration bilaterally. No accessory muscle use on room air. Cardiovascular: Regular rate. Regular rhythm. No edema. Abdomen: Soft. Nondistended. Normal bowel sounds.  PFT 09/25/15: FVC 2.85 L (95%) FEV1 1.58 L (70%) FEV1/FVC 0.55 FEF 25-75 0.61 L (33%) TLC 4.75 L (92%) RV 73% DLCO uncorrected 58%  IMAGING CXR PA/LAT 06/14/16 (personally reviewed by me):  Resolved  right middle lobe opacity. No parenchymal opacity or mass appreciated otherwise. No pleural effusion. Heart normal in size & mediastinum normal in contour.  CXR PA/LAT 03/25/16 (previously reviewed by me): Patchy opacification within right middle lobe. No pleural effusion appreciated. Heart normal in size & mediastinum normal in contour.  CXR PA/LAT 08/02/14 (per radiologist): No active disease. Hyperinflation. Fibrotic changes LUL.  CARDIAC EKG (08/16/15):  RBB & QTc 453ms.  TTE (08/05/12): LV with mild basal septal hypertrophy. EF 60-65%. Normal regional wall motion. Grade 1 diastolic dysfunction. LA & RA normal in size. RV normal in size & function. No aortic stenosis or regurg. No mitral stenosis & trivial regurg. No pulmonic regurg.     Assessment & Plan:  71 y.o. female with right middle lobe pneumonia which seems to have resolved on repeat imaging along with moderate persistent asthma, chronic allergic rhinitis, and tobacco use disorder. Patient continues to smoke tobacco. I did discuss the fact that until she quits completely she will be unable to achieve maximal benefit from her inhalers. We reviewed her chest x-ray which shows resolution of her right middle lobe opacity on my review. Radiology report is pending. Patient does still have some mild wheezing from her asthma but no signs of acute exacerbation at this time. Additionally, on her current regimen her allergic rhinitis seems to be well-controlled. I instructed the patient contact my office if she had any new breathing problems or questions before her next appointment.  1. Right middle lobe pneumonia: Resolved. 2. Moderate, persistent asthma: Continuing Flovent & Singulair. Consider de-escalation of Flovent frequency/nose at next appointment depending upon symptom control. 3. Chronic allergic rhinitis: Continuing Flonase and Singulair. No changes. 4. Tobacco use disorder: Patient counseled for over 3 minutes and need for tobacco  cessation. Recommended nicotine gum for intermittent periods of stress and craving. 5. Health maintenance: Status post Influenza Vaccine September 2017, Tdap February 2014, Prevnar April 2016, & Pneumovax February 2016.  6. Follow-up: Return to clinic in 6 months or sooner if needed.  Sonia Baller Ashok Cordia, M.D. [facility] Pager:  [phone] After 3pm or if no response, call [phone] 11:22  AM 06/14/16

## 2016-06-17 ENCOUNTER — Encounter: Payer: Self-pay | Admitting: Gynecology

## 2016-06-17 ENCOUNTER — Ambulatory Visit (INDEPENDENT_AMBULATORY_CARE_PROVIDER_SITE_OTHER): Payer: Medicare Other | Admitting: Internal Medicine

## 2016-06-17 VITALS — BP 128/78 | HR 71 | Ht 65.0 in | Wt 157.5 lb

## 2016-06-17 DIAGNOSIS — I480 Paroxysmal atrial fibrillation: Secondary | ICD-10-CM | POA: Diagnosis not present

## 2016-06-17 NOTE — Progress Notes (Signed)
CP: Elizabeth Calico, MD Primary Cardiologist:  Dr Jamey Ripa Elizabeth Mcconnell is a 71 y.o. female who presents today for routine electrophysiology followup.  She is doing well with tikosyn.  She continues to have intermittent episodes of atrial fibrillation despite tikosyn.  Today, she denies symptoms of chest pain, shortness of breath,  lower extremity edema, dizziness, presyncope, or syncope.  The patient is otherwise without complaint today.   Past Medical History:  Diagnosis Date  . Adenomatous polyp of colon 10/2013   f/u colo 10/2018  . Allergic rhinitis   . ALLERGIC RHINITIS   . ANXIETY    "situational" (02/14/2015)  . Arthritis    "neck, back, hands" (02/14/2015)  . Asthma   . Atrial fibrillation with RVR ([place])   . Atrial flutter ([place])    failed cardioversion 11/2012; s/p ablation 02-02-2013 by Dr Rayann Heman  . Chronic bronchitis ([place])    "I'll have it most years" (02/14/2015)  . Diverticulosis   . Endometriosis   . Essential hypertension 05/30/2014  . Hepatic steatosis   . Oral candidiasis   . Osteopenia 08/2015   T score -1.7 FRAX 14%/4.4% overall stable from prior DEXA.  [name] Pneumothorax on left 1996   left lower lung collapse s/p resection  . Small bowel obstruction ([place])   . Tobacco abuse    Past Surgical History:  Procedure Laterality Date  . ABDOMINAL HYSTERECTOMY    . APPENDECTOMY  1978  . ATRIAL FLUTTER ABLATION N/A 02/02/2013   Procedure: ATRIAL FLUTTER ABLATION;  Surgeon: Coralyn Mark, MD;  Location: [facility];  Service: Cardiovascular;  Laterality: N/A;  . CARDIOVERSION N/A 11/27/2012   Procedure: CARDIOVERSION;  Surgeon: Thayer Headings, MD;  Location: [facility];  Service: Cardiovascular;  Laterality: N/A;  . [place]; 1978  . LAPAROSCOPIC PARTIAL COLECTOMY N/A 05/04/2015   Procedure: LAPAROSCOPIC LOW ANTERIOR RESECTION LAPAROSCOPIC LYSIS OF ADHESIONS, SPLENIC FLEXURE MOBILIZATION;  Surgeon: Michael Boston, MD;  [facility];  Service: General;   Laterality: N/A;  . LUNG REMOVAL, PARTIAL Left 02/1965   LLL  . TOTAL ABDOMINAL HYSTERECTOMY W/ BILATERAL SALPINGOOPHORECTOMY  1984   Endometriosis    Current Outpatient Prescriptions  Medication Sig Dispense Refill  . acetaminophen (TYLENOL) 650 MG CR tablet Take 650-1,300 mg by mouth at bedtime as needed for pain.     [name] ALPRAZolam (XANAX) 0.25 MG tablet Take 0.25 mg by mouth 2 (two) times daily as needed for anxiety.     . cyanocobalamin 2000 MCG tablet Take 1 tablet (2,000 mcg total) by mouth daily. 90 tablet 3  . diltiazem (CARDIZEM CD) 300 MG 24 hr capsule TAKE 1 CAPSULE BY MOUTH NIGHTLY AT BEDTIME 30 capsule 11  . dofetilide (TIKOSYN) 250 MCG capsule Take 1 capsule (250 mcg total) by mouth 2 (two) times daily. 14 capsule 0  . ELIQUIS 5 MG TABS tablet TAKE 1 TABLET BY MOUTH 2 TIMES DAILY 180 tablet 2  . estradiol (VIVELLE-DOT) 0.05 MG/24HR patch Place 1 patch (0.05 mg total) onto the skin 2 (two) times a week. 8 patch 12  . fluticasone (FLONASE) 50 MCG/ACT nasal spray USE 2 SPRAYS IN EACH NOSTRIL 2 TIMES DAILY 16 g 2  . fluticasone (FLOVENT HFA) 110 MCG/ACT inhaler Inhale 2 puffs into the lungs 2 (two) times daily. 1 Inhaler 12  . levalbuterol (XOPENEX HFA) 45 MCG/ACT inhaler Inhale 1-2 puffs into the lungs every 4 (four) hours as needed for wheezing or shortness of breath.    [name]  magnesium oxide (MAG-OX) 400 MG tablet Take 1 tablet (400 mg total) by mouth daily. 30 tablet 6  . Melatonin 3 MG TABS Take 3 mg by mouth at bedtime as needed (for sleep).     . montelukast (SINGULAIR) 10 MG tablet Take 1 tablet (10 mg total) by mouth daily. 30 tablet 5   No current facility-administered medications for this visit.     Physical Exam: Vitals:   06/17/16 1246  BP: 128/78  Pulse: 71  SpO2: 94%  Weight: 157 lb 8 oz (71.4 kg)  Height: 5\' 5"  (1.651 m)    GEN- The patient is well appearing, alert and oriented x 3 today.   Head- normocephalic, atraumatic Eyes-  Sclera clear, conjunctiva  pink Ears- hearing intact Oropharynx- clear Lungs- CTAB, normal work of breathing Heart- Regular rate and rhythm, no murmurs, rubs or gallops, PMI not laterally displaced GI- soft, NT, ND, + BS Extremities- no clubbing, cyanosis, or edema  ekg today reveals sinus rhythm 71 bpm, with incomplete RBBB, Qtc 502  Assessment and Plan:  1. Atrial fibrillation Continue tikosyn On eliquis Therapeutic strategies for afib including medicine and ablation were discussed in detail with the patient today. Risk, benefits, and alternatives to EP study and radiofrequency ablation for afib were also discussed in detail today.  At this time, she wishes to continue tikosyn.  She will contact my office if she decides to proceed with ablation.   Follow-up with [name] in the AF clinic in 3 months I will see in 6 months  Thompson Grayer MD, [facility] 06/17/2016 12:51 PM

## 2016-06-17 NOTE — Patient Instructions (Signed)
Medication Instructions:  Your physician recommends that you continue on your current medications as directed. Please refer to the Current Medication list given to you today.   Labwork: None ordered   Testing/Procedures: None ordered   Follow-Up:  Your physician recommends that you schedule a follow-up appointment in: 3 months with Donna Carroll, NP and 6 months with Dr Allred   Any Other Special Instructions Will Be Listed Below (If Applicable).     If you need a refill on your cardiac medications before your next appointment, please call your pharmacy.   

## 2016-07-17 ENCOUNTER — Other Ambulatory Visit: Payer: Self-pay | Admitting: Pulmonary Disease

## 2016-07-17 ENCOUNTER — Other Ambulatory Visit: Payer: Self-pay | Admitting: Cardiovascular Disease

## 2016-07-17 NOTE — Telephone Encounter (Signed)
Pt saw Dr Rayann Heman on 06/17/16, Wt 71.4Kg, Age 71 yrs old. SCr 0.64 on 05/13/16. Eliquis 5mg s BID refilled.

## 2016-08-02 ENCOUNTER — Telehealth: Payer: Self-pay | Admitting: Pulmonary Disease

## 2016-08-02 ENCOUNTER — Ambulatory Visit (INDEPENDENT_AMBULATORY_CARE_PROVIDER_SITE_OTHER): Payer: Medicare Other | Admitting: Pulmonary Disease

## 2016-08-02 ENCOUNTER — Encounter: Payer: Self-pay | Admitting: Pulmonary Disease

## 2016-08-02 VITALS — BP 130/80 | HR 75 | Temp 98.5°F | Ht 65.0 in | Wt 156.6 lb

## 2016-08-02 DIAGNOSIS — J4541 Moderate persistent asthma with (acute) exacerbation: Secondary | ICD-10-CM

## 2016-08-02 DIAGNOSIS — J309 Allergic rhinitis, unspecified: Secondary | ICD-10-CM | POA: Diagnosis not present

## 2016-08-02 MED ORDER — PREDNISONE 20 MG PO TABS: 40.0000 mg | ORAL_TABLET | Freq: Every day | ORAL | 0 refills | Status: DC

## 2016-08-02 NOTE — Telephone Encounter (Signed)
Please place with another available provider or nurse practitioner. Thanks.

## 2016-08-02 NOTE — Telephone Encounter (Signed)
Put her in one of my held spots at the very end of the day. If she wants to try to get an appointment with her PCP sooner then that is fine too.

## 2016-08-02 NOTE — Telephone Encounter (Signed)
Spoke with pt, who is requesting an acute visit for today. Pt feels that she is currently having an asthma flare. Pt reports of chest congestion, increased sob, wheezing & chest tightness x Pt states JN had prescribed an Rx for doxycycline 100mg  to hold, as pt was planning to travel.  Pt states she started abx on 07/18/16 and finished on 07/23/16 with mild improvement.   JN please advise if okay to use a held slot for today. Thanks.

## 2016-08-02 NOTE — Patient Instructions (Signed)
   Use your Xopenex inhaler 3 times daily regularly while you are sick.   You should start to feel better around Monday in terms of your breathing.  If things get worse over the weekend seek immediate medical attention.  Keep using all your other medications as prescribed.  You can continue to use the Mucinex twice daily if it helps with your cough.  You should let me know if you develop a cough producing any discolored sputum or develop any fever, chills, or sweats as we may need to put you back on an antibiotic.  We will keep your appointment in 6 months (October) as planned.

## 2016-08-02 NOTE — Progress Notes (Signed)
Subjective:    Patient ID: Elizabeth Mcconnell, female    DOB: August 24, 1945, 71 y.o.   MRN: [id number]  C.C.:  Acute Visit for Cough with known Moderate, Persistent Asthma, Chronic Allergic Rhinitis, & Tobacco Use Disorder.  HPI  Cough: Patient reports she did take her emergency prescription of doxycycline and has also been using Mucinex for sinus congestion with drainage. She reports her symptoms started on 5/19. She reports she started with a sore throat. She started the Doxycycline on Monday (5/21). Her cough had significantly improved but then yesterday returned.   Moderate, persistent asthma: Currently on Flovent and Singulair. Also prescribed a Xopenex inhaler to use as needed. She reports she did have increased dyspnea last night. She used her Xopenex more the last couple of days to help with her coughing. She did wake up coughing at night. She has had increased wheezing.   Chronic allergic rhinitis: Currently prescribed Flonase and Singulair. She reports now she has minimal sinus congestion & drainage but this has significantly improved since last week.   Tobacco use disorder: Patient continuing to smoke cigarettes intermittently during times of high stress. Previously had not tried nicotine replacement for smoking cessation.   Review of Systems She reports previously she did have some fatigue that improved slightly. No recent fever or chills but did have chills earlier. She does have some chest tightness but no pressure or pain.   Allergies  Allergen Reactions  . Tramadol Other (See Comments)    Reaction:  Dizziness and weakness   . Amoxicillin-Pot Clavulanate Nausea And Vomiting and Other (See Comments)    Has patient had a PCN reaction causing immediate rash, facial/tongue/throat swelling, SOB or lightheadedness with hypotension: No Has patient had a PCN reaction causing severe rash involving mucus membranes or skin necrosis: No Has patient had a PCN reaction that required  hospitalization No Has patient had a PCN reaction occurring within the last 10 years: No If all of the above answers are "NO", then may proceed with Cephalosporin use.  . Flagyl [Metronidazole] Nausea And Vomiting  . Flecainide Other (See Comments)    Reaction:  Dizziness   . Omnicef [Cefdinir] Nausea And Vomiting  . Avelox [Moxifloxacin] Hives and Itching    Current Outpatient Prescriptions on File Prior to Visit  Medication Sig Dispense Refill  . acetaminophen (TYLENOL) 650 MG CR tablet Take 650-1,300 mg by mouth at bedtime as needed for pain.     [name] ALPRAZolam (XANAX) 0.25 MG tablet Take 0.25 mg by mouth 2 (two) times daily as needed for anxiety.     . cyanocobalamin 2000 MCG tablet Take 1 tablet (2,000 mcg total) by mouth daily. 90 tablet 3  . diltiazem (CARDIZEM CD) 300 MG 24 hr capsule TAKE 1 CAPSULE BY MOUTH NIGHTLY AT BEDTIME 30 capsule 11  . dofetilide (TIKOSYN) 250 MCG capsule Take 1 capsule (250 mcg total) by mouth 2 (two) times daily. 14 capsule 0  . ELIQUIS 5 MG TABS tablet TAKE 1 TABLET BY MOUTH 2 TIMES DAILY 180 tablet 3  . estradiol (VIVELLE-DOT) 0.05 MG/24HR patch Place 1 patch (0.05 mg total) onto the skin 2 (two) times a week. 8 patch 12  . fluticasone (FLONASE) 50 MCG/ACT nasal spray USE 2 SPRAYS IN EACH NOSTRIL 2 TIMES DAILY 16 g 2  . fluticasone (FLOVENT HFA) 110 MCG/ACT inhaler Inhale 2 puffs into the lungs 2 (two) times daily. 1 Inhaler 12  . levalbuterol (XOPENEX HFA) 45 MCG/ACT inhaler Inhale 1-2 puffs into the  lungs every 4 (four) hours as needed for wheezing or shortness of breath.    . magnesium oxide (MAG-OX) 400 MG tablet Take 1 tablet (400 mg total) by mouth daily. 30 tablet 6  . Melatonin 3 MG TABS Take 3 mg by mouth at bedtime as needed (for sleep).     . montelukast (SINGULAIR) 10 MG tablet Take 1 tablet (10 mg total) by mouth daily. 30 tablet 5  . levalbuterol (XOPENEX HFA) 45 MCG/ACT inhaler inhale 1 TO 2 PUFFS BY MOUTH EVERY 4 HOURS AS NEEDED (Patient not  taking: Reported on 08/02/2016) 15 g 3   No current facility-administered medications on file prior to visit.     Past Medical History:  Diagnosis Date  . Adenomatous polyp of colon 10/2013   f/u colo 10/2018  . Allergic rhinitis   . ALLERGIC RHINITIS   . ANXIETY    "situational" (02/14/2015)  . Arthritis    "neck, back, hands" (02/14/2015)  . Asthma   . Atrial fibrillation with RVR ([place])   . Atrial flutter ([place])    failed cardioversion 11/2012; s/p ablation 02-02-2013 by Dr Rayann Heman  . Chronic bronchitis ([place])    "I'll have it most years" (02/14/2015)  . Diverticulosis   . Endometriosis   . Essential hypertension 05/30/2014  . Hepatic steatosis   . Oral candidiasis   . Osteopenia 08/2015   T score -1.7 FRAX 14%/4.4% overall stable from prior DEXA.  [name] Pneumothorax on left 1996   left lower lung collapse s/p resection  . Small bowel obstruction ([place])   . Tobacco abuse     Past Surgical History:  Procedure Laterality Date  . ABDOMINAL HYSTERECTOMY    . APPENDECTOMY  1978  . ATRIAL FLUTTER ABLATION N/A 02/02/2013   Procedure: ATRIAL FLUTTER ABLATION;  Surgeon: Coralyn Mark, MD;  Location: [facility];  Service: Cardiovascular;  Laterality: N/A;  . CARDIOVERSION N/A 11/27/2012   Procedure: CARDIOVERSION;  Surgeon: Thayer Headings, MD;  Location: [facility];  Service: Cardiovascular;  Laterality: N/A;  . [place]; 1978  . LAPAROSCOPIC PARTIAL COLECTOMY N/A 05/04/2015   Procedure: LAPAROSCOPIC LOW ANTERIOR RESECTION LAPAROSCOPIC LYSIS OF ADHESIONS, SPLENIC FLEXURE MOBILIZATION;  Surgeon: Michael Boston, MD;  Location: [facility];  Service: General;  Laterality: N/A;  . LUNG REMOVAL, PARTIAL Left 02/1965   LLL  . TOTAL ABDOMINAL HYSTERECTOMY W/ BILATERAL SALPINGOOPHORECTOMY  1984   Endometriosis    Family History  Problem Relation Age of Onset  . Asthma Maternal Grandfather   . Stroke Father 68  . Hypertension Father   . Heart disease Father   . Diabetes Mother   .  Asthma Child   . Colon cancer Neg Hx   . Rectal cancer Neg Hx   . Stomach cancer Neg Hx     Social History   Social History  . Marital status: Married    Spouse name: N/A  . Number of children: 2  . Years of education: N/A   Occupational History  . 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  [employer]   Social History Main Topics  . Smoking status: Current Some Day Smoker    Packs/day: 0.25    Years: 43.00    Types: Cigarettes  . Smokeless tobacco: Never Used  . Alcohol use 8.4 oz/week    14 Standard drinks or equivalent per week     Comment: daily wine  . Drug use: No  . Sexual activity: Yes  Birth control/ protection: Surgical, Post-menopausal     Comment: HYST-1st intercourse 71 yo-Fewer than 5 partners   Other Topics Concern  . None   Social History Narrative   Lives with spouse;    2 children      [facility]:   She is from [place]. 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 Previously has attended business school. Previously did office work and Programmer, applications at [employer]. She has also worked for [employer]. She does have a cat currently. She has indoor plants. No mold exposure.      Objective:   Physical Exam BP 130/80 (BP Location: Right Arm, Patient Position: Sitting, Cuff Size: Normal)   Pulse 75   Temp 98.5 F (36.9 C)   Ht 5\' 5"  (1.651 m)   Wt 156 lb 9.6 oz (71 kg)   SpO2 94%   BMI 26.06 kg/m   General:  Awake. Alert. No acute distress. Husband with patient today. Integument:  Warm & dry. No rash on exposed skin. No bruising on exposed skin. Extremities:  No cyanosis or clubbing.  HEENT:  Minimal nasal turbinate swelling. No sinus tenderness to palpation. Moist mucus membranes. e JVD.  Pulmonary:  Good aeration bilaterally but wheezing for approximately 50% of exhalation phase diffusely. Normal work of breathing on room air. Abdomen: Soft. Normal bowel sounds. Mildly protuberant. Musculoskeletal:  Normal bulk  and tone. No joint deformity or effusion appreciated.  PFT 09/25/15: FVC 2.85 L (95%) FEV1 1.58 L (70%) FEV1/FVC 0.55 FEF 25-75 0.61 L (33%) TLC 4.75 L (92%) RV 73% DLCO uncorrected 58%  IMAGING CXR PA/LAT 06/14/16 (Previously reviewed by me):  Resolved right middle lobe opacity. No parenchymal opacity or mass appreciated otherwise. No pleural effusion. Heart normal in size & mediastinum normal in contour.  CXR PA/LAT 03/25/16 (previously reviewed by me): Patchy opacification within right middle lobe. No pleural effusion appreciated. Heart normal in size & mediastinum normal in contour.  CXR PA/LAT 08/02/14 (per radiologist): No active disease. Hyperinflation. Fibrotic changes LUL.  CARDIAC EKG (08/16/15):  RBB & QTc 465ms.  TTE (08/05/12): LV with mild basal septal hypertrophy. EF 60-65%. Normal regional wall motion. Grade 1 diastolic dysfunction. LA & RA normal in size. RV normal in size & function. No aortic stenosis or regurg. No mitral stenosis & trivial regurg. No pulmonic regurg.     Assessment & Plan:  71 y.o. female with what appears to be an exacerbation of her underlying moderate, persistent asthma. It's unclear whether or not allergies or a viral illness is the direct cause. Certainly it sounds as though she had an acute sinusitis and has recovered well from this. In the absence of infectious symptoms I do not feel antibiotics are necessary at this time. However, with her wheezing I do feel that oral corticosteroid therapy would be of some benefit. I instructed the patient to seek immediate medical attention if her symptoms worsen and contact me if she had any further breathing problems or questions before her next appointment.   1. Moderate, persistent asthma with exacerbation: Treating with prednisone 40 mg daily 6 days. Recommended utilizing her Xopenex inhaler 3 times daily for the next couple of days while acutely ill. Continuing Singulair and Flovent. Consider de-escalation of  inhaled corticosteroid therapy at next appointment. 2. Chronic allergic rhinitis: Reasonably controlled with Flonase and Singulair. No changes. 3. Tobacco use disorder: Not addressed at this appointment. 4. Health maintenance: Status post Influenza Vaccine September 2017, Tdap February  2014, Prevnar April 2016, & Pneumovax February 2016.  5. Follow-up: Return to clinic in 6 months as previously planned or sooner if needed.   Sonia Baller Ashok Cordia, M.D. [facility] Pager:  [phone] After 3pm or if no response, call [phone] 4:05 PM 08/02/16

## 2016-08-02 NOTE — Telephone Encounter (Signed)
Pt has been scheduled for acute visit for 3:45 with JN.  Pt states she would prefer to see JN, rather then her PCP as that is not acceptable, as he does not know her.  Nothing further needed.

## 2016-08-02 NOTE — Telephone Encounter (Signed)
There is no availability with another provider or NP.

## 2016-08-05 ENCOUNTER — Ambulatory Visit (INDEPENDENT_AMBULATORY_CARE_PROVIDER_SITE_OTHER): Payer: Medicare Other | Admitting: Internal Medicine

## 2016-08-05 ENCOUNTER — Encounter: Payer: Self-pay | Admitting: Internal Medicine

## 2016-08-05 VITALS — BP 124/80 | HR 63 | Ht 65.0 in | Wt 157.0 lb

## 2016-08-05 DIAGNOSIS — K219 Gastro-esophageal reflux disease without esophagitis: Secondary | ICD-10-CM | POA: Diagnosis not present

## 2016-08-05 DIAGNOSIS — Z8719 Personal history of other diseases of the digestive system: Secondary | ICD-10-CM | POA: Diagnosis not present

## 2016-08-05 DIAGNOSIS — Z86010 Personal history of colonic polyps: Secondary | ICD-10-CM | POA: Diagnosis not present

## 2016-08-05 NOTE — Patient Instructions (Signed)
Please follow up with Dr Hilarie Fredrickson as needed.  If you are age 73 or older, your body mass index should be between 23-30. Your Body mass index is 26.13 kg/m. If this is out of the aforementioned range listed, please consider follow up with your Primary Care Provider.  If you are age 68 or younger, your body mass index should be between 19-25. Your Body mass index is 26.13 kg/m. If this is out of the aformentioned range listed, please consider follow up with your Primary Care Provider.

## 2016-08-05 NOTE — Telephone Encounter (Signed)
**Note De-Identified  Obfuscation** I called and spoke with Denton Ar at [facility] concerning the tier exception for Tikosyn that was done on 05/28/16. Denton Ar stated that the tier exception was approved on 05/29/16 and then the call was dropped.  I called the pts pharmacy, [facility], and was advised that the pt paid $24 for a 90 day supply of Tikosyn at her last refill last month.

## 2016-08-06 NOTE — Progress Notes (Signed)
   Subjective:    Patient ID: Elizabeth Mcconnell, female    DOB: 11-22-1945, 71 y.o.   MRN: [id number]  HPI Elizabeth Mcconnell is a 71 yo female with PMH of severe diverticular disease s/p lap LAR in March 2017 along with LOA and splenic flexure mobilization, post-op SBO here for follow-up.  She also has a history of colon polyps, afib on Eliquis, and asthma/COPD.  She is here alone today, and last seen 06/05/16.  She reports since last office visit she has been feeling much better. Bowel movements have been more regular without diarrhea or constipation. She denies abdominal pain. No issues with nausea, vomiting or abdominal bloating. No blood in her stool or melena.  She is being treated for asthma flare. She took doxycycline course a few weeks ago and is now taking 6 days of prednisone 40 mg daily. She sees Dr. Ashok Cordia with pulmonology.  Reflux has been much less of an issue recently and is only needed Zantac approximate 2 times since her last office visit 2 months ago. No dysphagia or odynophagia   Review of Systems As per HPI, otherwise negative  Current Medications, Allergies, Past Medical History, Past Surgical History, Family History and Social History were reviewed in [employer] record.     Objective:   Physical Exam BP 124/80   Pulse 63   Ht 5\' 5"  (1.651 m)   Wt 157 lb (71.2 kg)   SpO2 94%   BMI 26.13 kg/m  Constitutional: Well-developed and well-nourished. No distress. HEENT: Normocephalic and atraumatic. Oropharynx is clear and moist. Conjunctivae are normal.  No scleral icterus. Neck: Neck supple. Trachea midline. Cardiovascular: Normal rate, regular rhythm and intact distal pulses. No M/R/G Pulmonary/chest: Effort normal and breath sounds distant with end exp wheezes. Abdominal: Soft, nontender, nondistended. Bowel sounds active throughout.  Extremities: no clubbing, cyanosis, or edema Neurological: Alert and oriented to person place and  time. Skin: Skin is warm and dry. Psychiatric: Normal mood and affect. Behavior is normal.     Assessment & Plan:  70 yo female with PMH of severe diverticular disease s/p lap LAR in March 2017 along with LOA and splenic flexure mobilization, post-op SBO here for follow-up.  1. History of diverticulosis status post low anterior resection/small bowel obstruction due to adhesions -- she has recovered well with normalization of bowel habits now after left segmental colectomy. No change in treatment at present.  2. GERD -- can use Zantac 150 milligrams twice a day when necessary  3. History of colon polyps -- recall August 2020  Follow-up can be when necessary 15 minutes spent with the patient today. Greater than 50% was spent in counseling and coordination of care with the patient

## 2016-08-07 ENCOUNTER — Other Ambulatory Visit: Payer: Self-pay | Admitting: Pulmonary Disease

## 2016-08-07 ENCOUNTER — Telehealth: Payer: Self-pay | Admitting: Pulmonary Disease

## 2016-08-07 MED ORDER — DOXYCYCLINE HYCLATE 100 MG PO TABS: 100.0000 mg | ORAL_TABLET | Freq: Two times a day (BID) | ORAL | 0 refills | Status: DC

## 2016-08-07 NOTE — Telephone Encounter (Signed)
Please send in a Rx for Doxycycline 100mg  po bid - # 14. Remember to have her avoid dairy products, excessive sunlight, and take with a full glass of water remaining upright for 1 hour afterward. Thanks.

## 2016-08-07 NOTE — Telephone Encounter (Signed)
Spoke with patient. She saw JN on 08/02/16 was placed back on prednisone 40mg . She stated she was advised to call back if she was not feeling any better by Monday for an antibiotic. She has a productive cough with green mucus. She denied any SOB or chest pains. Denies any body aches.    Patient wishes to use [facility] on [address].    JN, please advise. Thanks!

## 2016-08-07 NOTE — Telephone Encounter (Signed)
Spoke with the pt and notified of recs per JN and she verbalized understanding  Rx was sent Nothing further needed

## 2016-08-08 ENCOUNTER — Ambulatory Visit (INDEPENDENT_AMBULATORY_CARE_PROVIDER_SITE_OTHER): Payer: Medicare Other | Admitting: Gynecology

## 2016-08-08 ENCOUNTER — Encounter: Payer: Self-pay | Admitting: Gynecology

## 2016-08-08 VITALS — BP 120/76 | Ht 64.0 in | Wt 157.0 lb

## 2016-08-08 DIAGNOSIS — Z01411 Encounter for gynecological examination (general) (routine) with abnormal findings: Secondary | ICD-10-CM

## 2016-08-08 DIAGNOSIS — N898 Other specified noninflammatory disorders of vagina: Secondary | ICD-10-CM | POA: Diagnosis not present

## 2016-08-08 DIAGNOSIS — M8580 Other specified disorders of bone density and structure, unspecified site: Secondary | ICD-10-CM | POA: Diagnosis not present

## 2016-08-08 DIAGNOSIS — N952 Postmenopausal atrophic vaginitis: Secondary | ICD-10-CM | POA: Diagnosis not present

## 2016-08-08 MED ORDER — ESTRADIOL 0.05 MG/24HR TD PTTW: 1.0000 | MEDICATED_PATCH | TRANSDERMAL | 12 refills | Status: DC

## 2016-08-08 NOTE — Patient Instructions (Signed)
Follow up in one year for annual exam 

## 2016-08-08 NOTE — Progress Notes (Signed)
    [name] Byrom [birth year] [id number]        71 y.o.  G2P2002 for annual exam.    Past medical history,surgical history, problem list, medications, allergies, family history and social history were all reviewed and documented as reviewed in the EPIC chart.  ROS:  Performed with pertinent positives and negatives included in the history, assessment and plan.   Additional significant findings :  None   Exam: [name] assistant Vitals:   08/08/16 1424  BP: 120/76  Weight: 157 lb (71.2 kg)  Height: 5\' 4"  (1.626 m)   Body mass index is 26.95 kg/m.  General appearance:  Normal affect, orientation and appearance. Skin: Grossly normal HEENT: Without gross lesions.  No cervical or supraclavicular adenopathy. Thyroid normal.  Lungs:  Clear without wheezing, rales or rhonchi Cardiac: RR, without RMG Abdominal:  Soft, nontender, without masses, guarding, rebound, organomegaly or hernia Breasts:  Examined lying and sitting without masses, retractions, discharge or axillary adenopathy. Pelvic:  Ext, BUS, Vagina: With atrophic changes. 2 cm midline posterior vaginal submucosal cyst  Adnexa: Without masses or tenderness    Anus and perineum: Normal   Rectovaginal: Normal sphincter tone without palpated masses or tenderness.    Assessment/Plan:  71 y.o. G9Q1194 female for annual exam.   1. Postmenopausal/atrophic genital changes. Continues on Vivelle 0.05 mg patch. Status post TAH/BSO in the past for endometriosis. I reviewed the most current literature on HRT to include the 2017 names guidelines. Benefits is for symptom relief and possible cardiovascular/bone health with early initiation as in her case versus risks to include stroke heart attack DVT possible breast cancer issues. Patient at this point wants to continue feels comfortable doing so and I refilled her 1 year. 2. Osteopenia. DEXA 08/2015 T score -1.7 stable from prior DEXA. Did have elevated FRAX but she is on HRT. Had  discussed possible initiation of medication such as bisphosphate but she is comfortable continuing her HRT without additional therapy. Plan repeat DEXA next year at 2 year interval. 3. Mammography 06/2016. Continue with annual mammography when due. SBE monthly reviewed. Breast exam normal today. 4. Pap smear 2011. No Pap smear done today. No history of abnormal Pap smears. We both agree to stop screening per current screening guidelines based on age and hysterectomy history. 5. Colonoscopy 2015. Repeat at their recommended interval. 6. Health maintenance. No routine lab work done as this is done elsewhere. Follow up 1 year, sooner as needed.   [name] MD, 2:56 PM 08/08/2016

## 2016-08-28 ENCOUNTER — Telehealth: Payer: Self-pay | Admitting: Pulmonary Disease

## 2016-08-28 NOTE — Telephone Encounter (Signed)
Per staff message received from [facility] "Please let the patient know that there has been a recall on Flonase Nasal Spray NDC# [phone] Lot #[id number] (Expiration 09/2018) due to glass particles in it. Have the patient check if this was a lot given to them with their prescription & take it to the pharmacy that it was filled at. Thanks."   Pt made aware of this information; she verbalized understanding and did not have any questions. Pt read information located on her Flonase bottle: NDC: [phone], lot # [id number], expiration date 03/2019. Nothing further is needed.

## 2016-09-05 ENCOUNTER — Ambulatory Visit (INDEPENDENT_AMBULATORY_CARE_PROVIDER_SITE_OTHER): Payer: Medicare Other | Admitting: Internal Medicine

## 2016-09-05 ENCOUNTER — Encounter: Payer: Self-pay | Admitting: Internal Medicine

## 2016-09-05 ENCOUNTER — Telehealth: Payer: Self-pay | Admitting: Internal Medicine

## 2016-09-05 VITALS — BP 116/78 | HR 73 | Ht 64.0 in | Wt 156.0 lb

## 2016-09-05 DIAGNOSIS — R21 Rash and other nonspecific skin eruption: Secondary | ICD-10-CM | POA: Diagnosis not present

## 2016-09-05 DIAGNOSIS — I10 Essential (primary) hypertension: Secondary | ICD-10-CM

## 2016-09-05 MED ORDER — PREDNISONE 10 MG PO TABS: 10.0000 mg | ORAL_TABLET | Freq: Every day | ORAL | 0 refills | Status: AC

## 2016-09-05 MED ORDER — TRIAMCINOLONE ACETONIDE 0.1 % EX CREA: 1.0000 "application " | TOPICAL_CREAM | Freq: Two times a day (BID) | CUTANEOUS | 0 refills | Status: DC

## 2016-09-05 NOTE — Patient Instructions (Signed)
You had the steroid shot today  Please take all new medication as prescribed - the prednisone, and the cream as directed  Please continue all other medications as before, and refills have been done if requested.  Please have the pharmacy call with any other refills you may need.  Please keep your appointments with your specialists as you may have planned

## 2016-09-05 NOTE — Progress Notes (Signed)
Subjective:    Patient ID: Elizabeth Mcconnell, female    DOB: 02/22/1946, 71 y.o.   MRN: [id number]  HPI  Here with 2 wks onset persistent constant bilat hand itching and redness, mild swelling, worst to first wake up in the AM, feels like on fire; not better with cortisone 10, benadryl topical and caladryl.  Pt denies fever, wt loss, night sweats, loss of appetite, or other constitutional symptoms  Pt denies chest pain, increased sob or doe, wheezing, orthopnea, PND, increased LE swelling, palpitations, dizziness or syncope.   Past Medical History:  Diagnosis Date  . Adenomatous polyp of colon 10/2013   f/u colo 10/2018  . Allergic rhinitis   . ALLERGIC RHINITIS   . ANXIETY    "situational" (02/14/2015)  . Arthritis    "neck, back, hands" (02/14/2015)  . Asthma   . Atrial fibrillation with RVR ([place])   . Atrial flutter ([place])    failed cardioversion 11/2012; s/p ablation 02-02-2013 by Dr Rayann Heman  . Chronic bronchitis ([place])    "I'll have it most years" (02/14/2015)  . Diverticulosis   . Endometriosis   . Essential hypertension 05/30/2014  . Hepatic steatosis   . Oral candidiasis   . Osteopenia 08/2015   T score -1.7 FRAX 14%/4.4% overall stable from prior DEXA.  [name] Pneumothorax on left 1996   left lower lung collapse s/p resection  . Small bowel obstruction ([place])   . Tobacco abuse    Past Surgical History:  Procedure Laterality Date  . ABDOMINAL HYSTERECTOMY    . APPENDECTOMY  1978  . ATRIAL FLUTTER ABLATION N/A 02/02/2013   Procedure: ATRIAL FLUTTER ABLATION;  Surgeon: Coralyn Mark, MD;  Location: [facility];  Service: Cardiovascular;  Laterality: N/A;  . CARDIOVERSION N/A 11/27/2012   Procedure: CARDIOVERSION;  Surgeon: Thayer Headings, MD;  Location: [facility];  Service: Cardiovascular;  Laterality: N/A;  . [place]; 1978  . LAPAROSCOPIC PARTIAL COLECTOMY N/A 05/04/2015   Procedure: LAPAROSCOPIC LOW ANTERIOR RESECTION LAPAROSCOPIC LYSIS OF ADHESIONS, SPLENIC  FLEXURE MOBILIZATION;  Surgeon: Michael Boston, MD;  [facility];  Service: General;  Laterality: N/A;  . LUNG REMOVAL, PARTIAL Left 02/1965   LLL  . TOTAL ABDOMINAL HYSTERECTOMY W/ BILATERAL SALPINGOOPHORECTOMY  1984   Endometriosis    reports that she has been smoking Cigarettes.  She has a 10.75 pack-year smoking history. She has never used smokeless tobacco. She reports that she drinks about 8.4 oz of alcohol per week . She reports that she does not use drugs. family history includes Asthma in her child and maternal grandfather; Diabetes in her mother; Heart disease in her father; Hypertension in her father; Stroke (age of onset: 24) in her father. Allergies  Allergen Reactions  . Tramadol Other (See Comments)    Reaction:  Dizziness and weakness   . Amoxicillin-Pot Clavulanate Nausea And Vomiting and Other (See Comments)    Has patient had a PCN reaction causing immediate rash, facial/tongue/throat swelling, SOB or lightheadedness with hypotension: No Has patient had a PCN reaction causing severe rash involving mucus membranes or skin necrosis: No Has patient had a PCN reaction that required hospitalization No Has patient had a PCN reaction occurring within the last 10 years: No If all of the above answers are "NO", then may proceed with Cephalosporin use.  . Flagyl [Metronidazole] Nausea And Vomiting  . Flecainide Other (See Comments)    Reaction:  Dizziness   . Omnicef [Cefdinir] Nausea And Vomiting  .  Avelox [Moxifloxacin] Hives and Itching   Current Outpatient Prescriptions on File Prior to Visit  Medication Sig Dispense Refill  . acetaminophen (TYLENOL) 650 MG CR tablet Take 650-1,300 mg by mouth at bedtime as needed for pain.     [name] ALPRAZolam (XANAX) 0.25 MG tablet Take 0.25 mg by mouth 2 (two) times daily as needed for anxiety.     . cyanocobalamin 2000 MCG tablet Take 1 tablet (2,000 mcg total) by mouth daily. 90 tablet 3  . diltiazem (CARDIZEM CD) 300 MG 24 hr capsule  TAKE 1 CAPSULE BY MOUTH NIGHTLY AT BEDTIME 30 capsule 11  . dofetilide (TIKOSYN) 250 MCG capsule Take 1 capsule (250 mcg total) by mouth 2 (two) times daily. 14 capsule 0  . ELIQUIS 5 MG TABS tablet TAKE 1 TABLET BY MOUTH 2 TIMES DAILY 180 tablet 3  . estradiol (VIVELLE-DOT) 0.05 MG/24HR patch Place 1 patch (0.05 mg total) onto the skin 2 (two) times a week. 8 patch 12  . fluticasone (FLONASE) 50 MCG/ACT nasal spray USE 2 SPRAYS IN EACH NOSTRIL 2 TIMES DAILY 16 g 1  . fluticasone (FLOVENT HFA) 110 MCG/ACT inhaler Inhale 2 puffs into the lungs 2 (two) times daily. 1 Inhaler 12  . levalbuterol (XOPENEX HFA) 45 MCG/ACT inhaler inhale 1 TO 2 PUFFS BY MOUTH EVERY 4 HOURS AS NEEDED 15 g 3  . magnesium oxide (MAG-OX) 400 MG tablet Take 1 tablet (400 mg total) by mouth daily. 30 tablet 6  . Melatonin 3 MG TABS Take 3 mg by mouth at bedtime as needed (for sleep).     . montelukast (SINGULAIR) 10 MG tablet Take 1 tablet (10 mg total) by mouth daily. 30 tablet 5   No current facility-administered medications on file prior to visit.    Review of Systems All otherwise neg per pt    Objective:   Physical Exam Blood pressure 116/78, pulse 73, height 5\' 4"  (1.626 m), weight 156 lb (70.8 kg), SpO2 96 %. VS noted, not ill appearing Constitutional: Pt appears in NAD HENT: Head: NCAT.  Right Ear: External ear normal.  Left Ear: External ear normal.  Eyes: . Pupils are equal, round, and reactive to light. Conjunctivae and EOM are normal Nose: without d/c or deformity Neck: Neck supple. Gross normal ROM Cardiovascular: Normal rate and regular rhythm.   Pulmonary/Chest: Effort normal and breath sounds without rales or wheezing.  Neurological: Pt is alert. At baseline orientation, motor grossly intact Skin: Skin is warm. + mild bilat palmar and anterior fingers nontender erythema and trace swelling, no other new lesions, no LE edema Psychiatric: Pt behavior is normal without agitation  No other exam  findings    Assessment & Plan:

## 2016-09-05 NOTE — Assessment & Plan Note (Signed)
stable overall by history and exam, recent data reviewed with pt, and pt to continue medical treatment as before,  to f/u any worsening symptoms or concerns BP Readings from Last 3 Encounters:  09/05/16 116/78  08/08/16 120/76  08/05/16 124/80

## 2016-09-05 NOTE — Assessment & Plan Note (Signed)
C/w allergic type of uncertain etiology, for depomedrol IM 80, predpac asd, and triam cr prn,  to f/u any worsening symptoms or concerns

## 2016-09-05 NOTE — Telephone Encounter (Signed)
Patient Name: Elizabeth Mcconnell  DOB: [birth year]    Initial Comment Caller states something is causing her hands to break out and itch really bad.    Nurse Assessment  Nurse: Raphael Gibney, RN, Vanita Ingles Date/Time (Eastern Time): 09/05/2016 8:40:55 AM  Confirm and document reason for call. If symptomatic, describe symptoms. ---Caller states both hands have a rash and are very itchy. Rash has spread to her wrist. Started out on one hand about 2 weeks ago. Itching was unable. has used benadryl and hydrocortisone and other creams and nothing is helping. Started using Calagel yesterday.  Does the patient have any new or worsening symptoms? ---Yes  Will a triage be completed? ---Yes  Related visit to physician within the last 2 weeks? ---No  Does the PT have any chronic conditions? (i.e. diabetes, asthma, etc.) ---Yes  List chronic conditions. ---asthma; A fib  Is this a behavioral health or substance abuse call? ---No     Guidelines    Guideline Title Affirmed Question Affirmed Notes  Rash or Redness - Localized [1] Severe localized itching AND [2] after 2 days of steroid cream    Final Disposition User   See PCP When Office is Open (within 3 days) Stringer, RN, Vanita Ingles    Comments  pt would like call back if there is a cancellation for an earlier appt.  pt already has appt for 6 pm today with Dr Cathlean Cower   Disagree/Comply: Comply

## 2016-09-17 ENCOUNTER — Ambulatory Visit (HOSPITAL_COMMUNITY)
Admission: RE | Admit: 2016-09-17 | Discharge: 2016-09-17 | Disposition: A | Discharge status: Home / Self Care | Payer: Medicare Other | Source: Ambulatory Visit | Attending: Nurse Practitioner | Admitting: Nurse Practitioner

## 2016-09-17 ENCOUNTER — Encounter (HOSPITAL_COMMUNITY): Payer: Self-pay | Admitting: Nurse Practitioner

## 2016-09-17 VITALS — BP 118/62 | HR 97 | Ht 64.0 in | Wt 153.6 lb

## 2016-09-17 DIAGNOSIS — F17210 Nicotine dependence, cigarettes, uncomplicated: Secondary | ICD-10-CM | POA: Insufficient documentation

## 2016-09-17 DIAGNOSIS — M8580 Other specified disorders of bone density and structure, unspecified site: Secondary | ICD-10-CM | POA: Diagnosis not present

## 2016-09-17 DIAGNOSIS — F419 Anxiety disorder, unspecified: Secondary | ICD-10-CM | POA: Insufficient documentation

## 2016-09-17 DIAGNOSIS — Z880 Allergy status to penicillin: Secondary | ICD-10-CM | POA: Diagnosis not present

## 2016-09-17 DIAGNOSIS — I480 Paroxysmal atrial fibrillation: Secondary | ICD-10-CM | POA: Diagnosis not present

## 2016-09-17 DIAGNOSIS — Z7901 Long term (current) use of anticoagulants: Secondary | ICD-10-CM | POA: Insufficient documentation

## 2016-09-17 DIAGNOSIS — I10 Essential (primary) hypertension: Secondary | ICD-10-CM | POA: Diagnosis not present

## 2016-09-17 DIAGNOSIS — R42 Dizziness and giddiness: Secondary | ICD-10-CM | POA: Insufficient documentation

## 2016-09-17 DIAGNOSIS — Z79899 Other long term (current) drug therapy: Secondary | ICD-10-CM | POA: Insufficient documentation

## 2016-09-17 LAB — BASIC METABOLIC PANEL
Anion gap: 9 (ref 5–15)
BUN: 17 mg/dL (ref 6–20)
CO2: 23 mmol/L (ref 22–32)
Calcium: 9.5 mg/dL (ref 8.9–10.3)
Chloride: 104 mmol/L (ref 101–111)
Creatinine, Ser: 0.91 mg/dL (ref 0.44–1.00)
GFR calc Af Amer: >60 mL/min (ref >60)
GFR calc non Af Amer: >60 mL/min (ref >60)
Glucose, Bld: 130 mg/dL — ABNORMAL HIGH (ref 65–99)
Potassium: 4 mmol/L (ref 3.5–5.1)
Sodium: 136 mmol/L (ref 135–145)

## 2016-09-17 LAB — MAGNESIUM: Magnesium: 2.1 mg/dL (ref 1.7–2.4)

## 2016-09-17 NOTE — Progress Notes (Signed)
Patient ID: Elizabeth Mcconnell, female   DOB: December 03, 1945, 71 y.o.   MRN: [id number]     Primary Care Physician: Janith Lima, MD Referring Physician: Hansel Starling   Elizabeth Mcconnell is a 71 y.o. female with a h/o a flutter abaltion 2014, PAF on tikosyn for f/u in the afib clinic 09/17/16. She continues on Eliquis 5 mg bid, no bleeding issus. Continues on Tikosyn with stable qtc.Reports several episodes  a day of being lightheaded to the point that she feels as though she may pass out. Episodes pass quickly. She also feels that she has afib more often than before. Dr.Allred did discuss an afib ablation with her on last visit but she was not ready to commit. EKG today shows a burst of afib with a very short termination pause before returning to SR.  Today, she denies symptoms of palpitations, chest pain, shortness of breath, orthopnea, PND, lower extremity edema, dizziness, presyncope, syncope, or neurologic sequela. Weepy in the office today dealing with stress and chroinc pain The patient is tolerating medications without difficulties and is otherwise without complaint today.   Past Medical History:  Diagnosis Date  . Adenomatous polyp of colon 10/2013   f/u colo 10/2018  . Allergic rhinitis   . ALLERGIC RHINITIS   . ANXIETY    "situational" (02/14/2015)  . Arthritis    "neck, back, hands" (02/14/2015)  . Asthma   . Atrial fibrillation with RVR ([place])   . Atrial flutter ([place])    failed cardioversion 11/2012; s/p ablation 02-02-2013 by Dr Rayann Heman  . Chronic bronchitis ([place])    "I'll have it most years" (02/14/2015)  . Diverticulosis   . Endometriosis   . Essential hypertension 05/30/2014  . Hepatic steatosis   . Oral candidiasis   . Osteopenia 08/2015   T score -1.7 FRAX 14%/4.4% overall stable from prior DEXA.  [name] Pneumothorax on left 1996   left lower lung collapse s/p resection  . Small bowel obstruction ([place])   . Tobacco abuse    Past Surgical History:  Procedure Laterality  Date  . ABDOMINAL HYSTERECTOMY    . APPENDECTOMY  1978  . ATRIAL FLUTTER ABLATION N/A 02/02/2013   Procedure: ATRIAL FLUTTER ABLATION;  Surgeon: Coralyn Mark, MD;  Location: [facility];  Service: Cardiovascular;  Laterality: N/A;  . CARDIOVERSION N/A 11/27/2012   Procedure: CARDIOVERSION;  Surgeon: Thayer Headings, MD;  Location: [facility];  Service: Cardiovascular;  Laterality: N/A;  . [place]; 1978  . LAPAROSCOPIC PARTIAL COLECTOMY N/A 05/04/2015   Procedure: LAPAROSCOPIC LOW ANTERIOR RESECTION LAPAROSCOPIC LYSIS OF ADHESIONS, SPLENIC FLEXURE MOBILIZATION;  Surgeon: Michael Boston, MD;  [facility];  Service: General;  Laterality: N/A;  . LUNG REMOVAL, PARTIAL Left 02/1965   LLL  . TOTAL ABDOMINAL HYSTERECTOMY W/ BILATERAL SALPINGOOPHORECTOMY  1984   Endometriosis    Current Outpatient Prescriptions  Medication Sig Dispense Refill  . acetaminophen (TYLENOL) 650 MG CR tablet Take 650-1,300 mg by mouth at bedtime as needed for pain.     [name] ALPRAZolam (XANAX) 0.25 MG tablet Take 0.25 mg by mouth 2 (two) times daily as needed for anxiety.     . cyanocobalamin 2000 MCG tablet Take 1 tablet (2,000 mcg total) by mouth daily. 90 tablet 3  . diltiazem (CARDIZEM CD) 300 MG 24 hr capsule TAKE 1 CAPSULE BY MOUTH NIGHTLY AT BEDTIME 30 capsule 11  . dofetilide (TIKOSYN) 250 MCG capsule Take 1 capsule (250 mcg total) by mouth 2 (two) times  daily. 14 capsule 0  . ELIQUIS 5 MG TABS tablet TAKE 1 TABLET BY MOUTH 2 TIMES DAILY 180 tablet 3  . estradiol (VIVELLE-DOT) 0.05 MG/24HR patch Place 1 patch (0.05 mg total) onto the skin 2 (two) times a week. 8 patch 12  . fluticasone (FLONASE) 50 MCG/ACT nasal spray USE 2 SPRAYS IN EACH NOSTRIL 2 TIMES DAILY 16 g 1  . fluticasone (FLOVENT HFA) 110 MCG/ACT inhaler Inhale 2 puffs into the lungs 2 (two) times daily. 1 Inhaler 12  . levalbuterol (XOPENEX HFA) 45 MCG/ACT inhaler inhale 1 TO 2 PUFFS BY MOUTH EVERY 4 HOURS AS NEEDED 15 g 3  . magnesium  oxide (MAG-OX) 400 MG tablet Take 1 tablet (400 mg total) by mouth daily. 30 tablet 6  . Melatonin 3 MG TABS Take 3 mg by mouth at bedtime as needed (for sleep).     . montelukast (SINGULAIR) 10 MG tablet Take 1 tablet (10 mg total) by mouth daily. 30 tablet 5  . triamcinolone cream (KENALOG) 0.1 % Apply 1 application topically 2 (two) times daily. 30 g 0   No current facility-administered medications for this encounter.     Allergies  Allergen Reactions  . Tramadol Other (See Comments)    Reaction:  Dizziness and weakness   . Amoxicillin-Pot Clavulanate Nausea And Vomiting and Other (See Comments)    Has patient had a PCN reaction causing immediate rash, facial/tongue/throat swelling, SOB or lightheadedness with hypotension: No Has patient had a PCN reaction causing severe rash involving mucus membranes or skin necrosis: No Has patient had a PCN reaction that required hospitalization No Has patient had a PCN reaction occurring within the last 10 years: No If all of the above answers are "NO", then may proceed with Cephalosporin use.  . Flagyl [Metronidazole] Nausea And Vomiting  . Flecainide Other (See Comments)    Reaction:  Dizziness   . Omnicef [Cefdinir] Nausea And Vomiting  . Avelox [Moxifloxacin] Hives and Itching    Social History   Social History  . Marital status: Married    Spouse name: N/A  . Number of children: 2  . Years of education: N/A   Occupational History  . 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  [employer]   Social History Main Topics  . Smoking status: Current Some Day Smoker    Packs/day: 0.25    Years: 43.00    Types: Cigarettes  . Smokeless tobacco: Never Used  . Alcohol use 8.4 oz/week    14 Standard drinks or equivalent per week     Comment: daily wine  . Drug use: No  . Sexual activity: Yes    Birth control/ protection: Surgical, Post-menopausal     Comment: HYST-1st intercourse 71 yo-Fewer than 5 partners   Other Topics Concern    . Not on file   Social History Narrative   Lives with spouse;    2 children      [facility]:   She is from [facility]. 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 Previously has attended business school. Previously did office work and Programmer, applications at [employer]. She has also worked for [employer]. She does have a cat currently. She has indoor plants. No mold exposure.    Family History  Problem Relation Age of Onset  . Asthma Maternal Grandfather   . Stroke Father 61  . Hypertension Father   . Heart disease Father   . Diabetes Mother   .  Asthma Child   . Colon cancer Neg Hx   . Rectal cancer Neg Hx   . Stomach cancer Neg Hx     ROS- All systems are reviewed and negative except as per the HPI above  Physical Exam: Vitals:   09/17/16 1419  Weight: 153 lb 9.6 oz (69.7 kg)  Height: 5\' 4"  (1.626 m)    GEN- The patient is well appearing, alert and oriented x 3 today.   Head- normocephalic, atraumatic Eyes-  Sclera clear, conjunctiva pink Ears- hearing intact Oropharynx- clear Neck- supple, no JVP Lymph- no cervical lymphadenopathy Lungs- Clear to ausculation bilaterally, normal work of breathing Heart- Regular rate and rhythm, no murmurs, rubs or gallops, PMI not laterally displaced GI- soft, NT, ND, + BS Extremities- no clubbing, cyanosis, or edema MS- no significant deformity or atrophy Skin- no rash or lesion Psych- euthymic mood, full affect Neuro- strength and sensation are intact  EKG-NSR at  97 bpm, with a short run of afib with a very short termination pause  Epic records reviewed     Assessment and Plan:  1. Paroxysmal afib Continue eliquis 5 mg bid Continue tikosyn 250 mg bid Continue cardizem daily and continue to use 30 mg "pill in pocket" cardizem, if she should have breakthrough afib, so far is working well for pt. Advised to go home and take one this am Decrease wine intake to diminish afib burden Bmet/mag  today  2. Intermittent lightheadedness Discussed 30 day event monitor vrs a LinQ Pt is interested in pursing a Linq Will coordinated with Dr. Jackalyn Lombard schedule  in the next couple of weeks She will be notified of time/date of procedure when scheduled   F/u with Dr. Rayann Heman  in 3 months afib clinic as scheduled  [name] C. Ebonee Stober, [facility] [address] [place], [place] [address] [phone]

## 2016-09-24 ENCOUNTER — Telehealth (HOSPITAL_COMMUNITY): Payer: Self-pay | Admitting: *Deleted

## 2016-09-28 ENCOUNTER — Other Ambulatory Visit: Payer: Self-pay | Admitting: Pulmonary Disease

## 2016-10-01 NOTE — Telephone Encounter (Signed)
LMOM x 3 for pt to callback to schedule LINQ.

## 2016-10-03 ENCOUNTER — Telehealth (HOSPITAL_COMMUNITY): Payer: Self-pay | Admitting: *Deleted

## 2016-10-03 NOTE — Telephone Encounter (Signed)
Pt cld back today regarding the msgs I left for the [facility] appt with Dr. Rayann Heman.  Pt stated that she is now confused because she only wants to do the Linq if Dr. Rayann Heman states that it is necessary and she wants her October appt moved up with him to discuss this.  She stated that at last discussion with Dr. Rayann Heman, another ablation was discussed but that she did not agree to it at that time.  She would like to discuss this now.

## 2016-10-09 ENCOUNTER — Encounter: Payer: Self-pay | Admitting: Internal Medicine

## 2016-10-09 ENCOUNTER — Ambulatory Visit (INDEPENDENT_AMBULATORY_CARE_PROVIDER_SITE_OTHER): Payer: Medicare Other | Admitting: Internal Medicine

## 2016-10-09 VITALS — BP 124/78 | HR 80 | Ht 65.0 in | Wt 151.0 lb

## 2016-10-09 DIAGNOSIS — I480 Paroxysmal atrial fibrillation: Secondary | ICD-10-CM

## 2016-10-09 NOTE — Patient Instructions (Signed)
Medication Instructions:  Your physician recommends that you continue on your current medications as directed. Please refer to the Current Medication list given to you today.   Labwork: Your physician recommends that you return for lab work on 10/23/16: BMP/CBC at 10am---You do not have to fast   Testing/Procedures:  Non-Cardiac CT scanning, (CAT scanning), is a noninvasive, special x-ray that produces cross-sectional images of the body using x-rays and a computer. CT scans help physicians diagnose and treat medical conditions. For some CT exams, a contrast material is used to enhance visibility in the area of the body being studied. CT scans provide greater clarity and reveal more details than regular x-ray exams.---once scheduled office will call you with instructions.  Will need to be the week of 10/28/16  Your physician has recommended that you have an ablation. Catheter ablation is a medical procedure used to treat some cardiac arrhythmias (irregular heartbeats). During catheter ablation, a long, thin, flexible tube is put into a blood vessel in your groin (upper thigh), or neck. This tube is called an ablation catheter. It is then guided to your heart through the blood vessel. Radio frequency waves destroy small areas of heart tissue where abnormal heartbeats may cause an arrhythmia to start. Please see the instruction sheet given to you today.---11/07/16  Please arrive at [facility] at 8:30am Do not eat or drink after midnight the night prior to the procedure Do not take any medications the morning of the test Plan for one night stay Will need someone to drive you home at discharge      Follow-Up: Your physician recommends that you schedule a follow-up appointment in: 4 weeks from 11/07/16 with Roderic Palau, NP and 3 months from 11/07/16 with Dr Rayann Heman  Any Other Special Instructions Will Be Listed Below (If Applicable).     If you need a  refill on your cardiac medications before your next appointment, please call your pharmacy.

## 2016-10-09 NOTE — Progress Notes (Signed)
 PCP: Jones, Thomas L, MD Primary Cardiologist: Dr Cooper Primary EP: Dr Adrianah Prophete  Elizabeth Mcconnell is a 71 y.o. female who presents today for routine electrophysiology followup.  Since last being seen in our clinic, the patient reports doing very well.    Unfortunately, she continues to have frequent salvos of afib.  She also has documented post termination pauses with symptoms of presyncope.  Today, she denies symptoms of palpitations, chest pain, shortness of breath,  lower extremity edema,  or syncope.  The patient is otherwise without complaint today.   Past Medical History:  Diagnosis Date  . Adenomatous polyp of colon 10/2013   f/u colo 10/2018  . Allergic rhinitis   . ALLERGIC RHINITIS   . ANXIETY    "situational" (02/14/2015)  . Arthritis    "neck, back, hands" (02/14/2015)  . Asthma   . Atrial fibrillation with RVR (HCC)   . Atrial flutter (HCC)    failed cardioversion 11/2012; s/p ablation 02-02-2013 by Dr Shell Yandow  . Chronic bronchitis (HCC)    "I'll have it most years" (02/14/2015)  . Diverticulosis   . Endometriosis   . Essential hypertension 05/30/2014  . Hepatic steatosis   . Oral candidiasis   . Osteopenia 08/2015   T score -1.7 FRAX 14%/4.4% overall stable from prior DEXA.  . Pneumothorax on left 1996   left lower lung collapse s/p resection  . Small bowel obstruction (HCC)   . Tobacco abuse    Past Surgical History:  Procedure Laterality Date  . ABDOMINAL HYSTERECTOMY    . APPENDECTOMY  1978  . ATRIAL FLUTTER ABLATION N/A 02/02/2013   Procedure: ATRIAL FLUTTER ABLATION;  Surgeon: Dyneisha Murchison D Jeshurun Oaxaca, MD;  Location: [facility] CATH LAB;  Service: Cardiovascular;  Laterality: N/A;  . CARDIOVERSION N/A 11/27/2012   Procedure: CARDIOVERSION;  Surgeon: Philip J Nahser, MD;  Location: [facility] ENDOSCOPY;  Service: Cardiovascular;  Laterality: N/A;  . CESAREAN SECTION  1976; 1978  . LAPAROSCOPIC PARTIAL COLECTOMY N/A 05/04/2015   Procedure: LAPAROSCOPIC LOW ANTERIOR RESECTION  LAPAROSCOPIC LYSIS OF ADHESIONS, SPLENIC FLEXURE MOBILIZATION;  Surgeon: Steven Gross, MD;  Location: [facility] ORS;  Service: General;  Laterality: N/A;  . LUNG REMOVAL, PARTIAL Left 02/1965   LLL  . TOTAL ABDOMINAL HYSTERECTOMY W/ BILATERAL SALPINGOOPHORECTOMY  1984   Endometriosis    ROS- all systems are reviewed and negatives except as per HPI above  Current Outpatient Prescriptions  Medication Sig Dispense Refill  . acetaminophen (TYLENOL) 650 MG CR tablet Take 650-1,300 mg by mouth at bedtime as needed for pain.     . ALPRAZolam (XANAX) 0.25 MG tablet Take 0.25 mg by mouth 2 (two) times daily as needed for anxiety.     . cyanocobalamin 2000 MCG tablet Take 1 tablet (2,000 mcg total) by mouth daily. 90 tablet 3  . diltiazem (CARDIZEM CD) 300 MG 24 hr capsule TAKE 1 CAPSULE BY MOUTH NIGHTLY AT BEDTIME 30 capsule 11  . dofetilide (TIKOSYN) 250 MCG capsule Take 1 capsule (250 mcg total) by mouth 2 (two) times daily. 14 capsule 0  . ELIQUIS 5 MG TABS tablet TAKE 1 TABLET BY MOUTH 2 TIMES DAILY 180 tablet 3  . estradiol (VIVELLE-DOT) 0.05 MG/24HR patch Place 1 patch (0.05 mg total) onto the skin 2 (two) times a week. 8 patch 12  . fluticasone (FLONASE) 50 MCG/ACT nasal spray USE 2 SPRAYS IN EACH NOSTRIL 2 TIMES DAILY 16 g 5  . fluticasone (FLOVENT HFA) 110 MCG/ACT inhaler Inhale 2 puffs into the lungs 2 (  two) times daily. 1 Inhaler 12  . levalbuterol (XOPENEX HFA) 45 MCG/ACT inhaler inhale 1 TO 2 PUFFS BY MOUTH EVERY 4 HOURS AS NEEDED 15 g 3  . magnesium oxide (MAG-OX) 400 MG tablet Take 1 tablet (400 mg total) by mouth daily. 30 tablet 6  . Melatonin 3 MG TABS Take 3 mg by mouth at bedtime as needed (for sleep).     . montelukast (SINGULAIR) 10 MG tablet Take 1 tablet (10 mg total) by mouth daily. 30 tablet 5   No current facility-administered medications for this visit.     Physical Exam: Vitals:   10/09/16 1029  BP: 124/78  Pulse: 80  SpO2: 96%  Weight: 151 lb (68.5 kg)  Height: 5' 5"  (1.651 m)    GEN- The patient is well appearing, alert and oriented x 3 today.   Head- normocephalic, atraumatic Eyes-  Sclera clear, conjunctiva pink Ears- hearing intact Oropharynx- clear Lungs- Clear to ausculation bilaterally, normal work of breathing Heart- Regular rate and rhythm, no murmurs, rubs or gallops, PMI not laterally displaced GI- soft, NT, ND, + BS Extremities- no clubbing, cyanosis, or edema  EKG tracing ordered today is personally reviewed and shows afib with conversion to sinus bradycardia (similar to ekg from afib clinic)  Assessment and Plan:  1. Paroxysmal atrial fibrillation The patient has symptomatic atrial arrhythmias.  She is documented to have salvos of afib with symptomatic pauses.  She has failed medical therapy with tikosyn.  She is s/p prior CTI ablation. Therapeutic strategies for afib including medicine and ablation were discussed in detail with the patient today. Risk, benefits, and alternatives to EP study and radiofrequency ablation for afib were also discussed in detail today. These risks include but are not limited to stroke, bleeding, vascular damage, tamponade, perforation, damage to the esophagus, lungs, and other structures, pulmonary vein stenosis, worsening renal function, and death. The patient understands these risk and wishes to proceed.  We will therefore proceed with catheter ablation at the next available time.  Will plan cardiac CT prior to ablation to evaluate for LAA thrombus.  2. Symptomatic post termination pauses Documented on ekg She is clear that she would like to avoid PPM Hopefully these will resolve with afib ablation.  Bryannah Boston MD, FACC 10/09/2016 10:54 AM  

## 2016-10-10 ENCOUNTER — Telehealth: Payer: Self-pay | Admitting: Internal Medicine

## 2016-10-10 ENCOUNTER — Encounter: Payer: Self-pay | Admitting: Internal Medicine

## 2016-10-10 NOTE — Telephone Encounter (Signed)
New message   Pt is returning call from nurse from yesterday.

## 2016-10-10 NOTE — Telephone Encounter (Signed)
Returned call to patient and let her know I had not called her yesterday.  While talking with her she wants to move her ablation to 11/05/16.  I did this and let her know to be at the hospital at 5:30am.  She thanked me for my call.

## 2016-10-10 NOTE — Telephone Encounter (Signed)
New message  Pt returning RN call from appt on 8/8. Please call back to discuss

## 2016-10-23 ENCOUNTER — Other Ambulatory Visit: Payer: Medicare Other | Admitting: *Deleted

## 2016-10-23 DIAGNOSIS — I480 Paroxysmal atrial fibrillation: Secondary | ICD-10-CM

## 2016-10-24 LAB — BASIC METABOLIC PANEL
BUN/Creatinine Ratio: 16 (ref 12–28)
BUN: 10 mg/dL (ref 8–27)
CO2: 21 mmol/L (ref 20–29)
Calcium: 9.3 mg/dL (ref 8.7–10.3)
Chloride: 104 mmol/L (ref 96–106)
Creatinine, Ser: 0.64 mg/dL (ref 0.57–1.00)
GFR calc Af Amer: 102 mL/min/{1.73_m2} (ref >59)
GFR calc non Af Amer: 89 mL/min/{1.73_m2} (ref >59)
Glucose: 99 mg/dL (ref 65–99)
Potassium: 4 mmol/L (ref 3.5–5.2)
Sodium: 141 mmol/L (ref 134–144)

## 2016-10-24 LAB — CBC WITH DIFFERENTIAL/PLATELET
Basophils Absolute: 0 10*3/uL (ref 0.0–0.2)
Basos: 0 %
EOS (ABSOLUTE): 0.1 10*3/uL (ref 0.0–0.4)
Eos: 1 %
Hematocrit: 40 % (ref 34.0–46.6)
Hemoglobin: 13.3 g/dL (ref 11.1–15.9)
Immature Grans (Abs): 0 10*3/uL (ref 0.0–0.1)
Immature Granulocytes: 0 %
Lymphocytes Absolute: 1.7 10*3/uL (ref 0.7–3.1)
Lymphs: 29 %
MCH: 28.6 pg (ref 26.6–33.0)
MCHC: 33.3 g/dL (ref 31.5–35.7)
MCV: 86 fL (ref 79–97)
Monocytes Absolute: 0.3 10*3/uL (ref 0.1–0.9)
Monocytes: 5 %
Neutrophils Absolute: 3.8 10*3/uL (ref 1.4–7.0)
Neutrophils: 65 %
Platelets: 172 10*3/uL (ref 150–379)
RBC: 4.65 x10E6/uL (ref 3.77–5.28)
RDW: 13.9 % (ref 12.3–15.4)
WBC: 5.9 10*3/uL (ref 3.4–10.8)

## 2016-10-30 ENCOUNTER — Ambulatory Visit (HOSPITAL_COMMUNITY)
Admission: RE | Admit: 2016-10-30 | Discharge: 2016-10-30 | Disposition: A | Discharge status: Home / Self Care | Payer: Medicare Other | Source: Ambulatory Visit | Attending: Internal Medicine | Admitting: Internal Medicine

## 2016-10-30 DIAGNOSIS — I480 Paroxysmal atrial fibrillation: Secondary | ICD-10-CM | POA: Diagnosis present

## 2016-10-30 DIAGNOSIS — I4891 Unspecified atrial fibrillation: Secondary | ICD-10-CM | POA: Diagnosis not present

## 2016-10-30 MED ORDER — METOPROLOL TARTRATE 5 MG/5ML IV SOLN
INTRAVENOUS | Status: AC
  Filled 2016-10-30: qty 5

## 2016-10-30 MED ORDER — METOPROLOL TARTRATE 5 MG/5ML IV SOLN
5.0000 mg | Freq: Once | INTRAVENOUS | Status: AC
  Administered 2016-10-30: 5 mg via INTRAVENOUS
  Filled 2016-10-30: qty 5

## 2016-10-30 MED ORDER — IOPAMIDOL (ISOVUE-370) INJECTION 76%
INTRAVENOUS | Status: AC
  Administered 2016-10-30: 100 mL
  Filled 2016-10-30: qty 100

## 2016-11-05 ENCOUNTER — Encounter (HOSPITAL_COMMUNITY)
Admission: RE | Disposition: A | Discharge status: Home / Self Care | Payer: Self-pay | Source: Ambulatory Visit | Attending: Internal Medicine

## 2016-11-05 ENCOUNTER — Ambulatory Visit (HOSPITAL_COMMUNITY): Payer: Medicare Other | Admitting: Certified Registered Nurse Anesthetist

## 2016-11-05 ENCOUNTER — Ambulatory Visit (HOSPITAL_COMMUNITY)
Admission: RE | Admit: 2016-11-05 | Discharge: 2016-11-05 | Disposition: A | Discharge status: Home / Self Care | Payer: Medicare Other | Source: Ambulatory Visit | Attending: Internal Medicine | Admitting: Internal Medicine

## 2016-11-05 ENCOUNTER — Encounter (HOSPITAL_COMMUNITY): Payer: Self-pay | Admitting: Internal Medicine

## 2016-11-05 DIAGNOSIS — I4891 Unspecified atrial fibrillation: Secondary | ICD-10-CM | POA: Diagnosis present

## 2016-11-05 DIAGNOSIS — J45909 Unspecified asthma, uncomplicated: Secondary | ICD-10-CM | POA: Diagnosis not present

## 2016-11-05 DIAGNOSIS — Z880 Allergy status to penicillin: Secondary | ICD-10-CM | POA: Diagnosis not present

## 2016-11-05 DIAGNOSIS — I480 Paroxysmal atrial fibrillation: Secondary | ICD-10-CM | POA: Insufficient documentation

## 2016-11-05 DIAGNOSIS — Z79899 Other long term (current) drug therapy: Secondary | ICD-10-CM | POA: Insufficient documentation

## 2016-11-05 DIAGNOSIS — I4892 Unspecified atrial flutter: Secondary | ICD-10-CM | POA: Insufficient documentation

## 2016-11-05 DIAGNOSIS — I10 Essential (primary) hypertension: Secondary | ICD-10-CM | POA: Diagnosis not present

## 2016-11-05 DIAGNOSIS — Z7901 Long term (current) use of anticoagulants: Secondary | ICD-10-CM | POA: Diagnosis not present

## 2016-11-05 HISTORY — PX: ATRIAL FIBRILLATION ABLATION: EP1191

## 2016-11-05 LAB — POCT ACTIVATED CLOTTING TIME
Activated Clotting Time: 279 seconds
Activated Clotting Time: 329 seconds
Activated Clotting Time: 175 seconds
Activated Clotting Time: 329 seconds

## 2016-11-05 SURGERY — ATRIAL FIBRILLATION ABLATION
Anesthesia: Monitor Anesthesia Care

## 2016-11-05 MED ORDER — PHENYLEPHRINE HCL 10 MG/ML IJ SOLN
INTRAMUSCULAR | Status: DC | PRN
  Administered 2016-11-05: 40 ug via INTRAVENOUS

## 2016-11-05 MED ORDER — SODIUM CHLORIDE 0.9 % IV SOLN
INTRAVENOUS | Status: DC
  Administered 2016-11-05: 06:00:00 via INTRAVENOUS

## 2016-11-05 MED ORDER — HEPARIN SODIUM (PORCINE) 1000 UNIT/ML IJ SOLN
INTRAMUSCULAR | Status: AC
  Filled 2016-11-05: qty 1

## 2016-11-05 MED ORDER — HYDROCODONE-ACETAMINOPHEN 5-325 MG PO TABS: 1.0000 | ORAL_TABLET | ORAL | Status: DC | PRN

## 2016-11-05 MED ORDER — IOPAMIDOL (ISOVUE-370) INJECTION 76%
INTRAVENOUS | Status: AC
  Filled 2016-11-05: qty 50

## 2016-11-05 MED ORDER — ALPRAZOLAM 0.25 MG PO TABS: 0.2500 mg | ORAL_TABLET | Freq: Every evening | ORAL | Status: DC | PRN

## 2016-11-05 MED ORDER — LIDOCAINE HCL (PF) 1 % IJ SOLN
INTRAMUSCULAR | Status: AC
  Filled 2016-11-05: qty 60

## 2016-11-05 MED ORDER — MIDAZOLAM HCL 5 MG/5ML IJ SOLN
INTRAMUSCULAR | Status: DC | PRN
  Administered 2016-11-05 (×2): 1 mg via INTRAVENOUS

## 2016-11-05 MED ORDER — SODIUM CHLORIDE 0.9% FLUSH
3.0000 mL | Freq: Two times a day (BID) | INTRAVENOUS | Status: DC
  Administered 2016-11-05: 3 mL via INTRAVENOUS

## 2016-11-05 MED ORDER — PROPOFOL 10 MG/ML IV BOLUS
INTRAVENOUS | Status: DC | PRN
  Administered 2016-11-05: 20 mg via INTRAVENOUS

## 2016-11-05 MED ORDER — MONTELUKAST SODIUM 10 MG PO TABS: 10.0000 mg | ORAL_TABLET | Freq: Every day | ORAL | Status: DC

## 2016-11-05 MED ORDER — IOPAMIDOL (ISOVUE-370) INJECTION 76%
INTRAVENOUS | Status: DC | PRN
  Administered 2016-11-05: 2 mL

## 2016-11-05 MED ORDER — ISOPROTERENOL HCL 0.2 MG/ML IJ SOLN
INTRAMUSCULAR | Status: AC
  Filled 2016-11-05: qty 5

## 2016-11-05 MED ORDER — LIDOCAINE HCL (PF) 1 % IJ SOLN
INTRAMUSCULAR | Status: DC | PRN
  Administered 2016-11-05: 20 mL

## 2016-11-05 MED ORDER — SODIUM CHLORIDE 0.9% FLUSH: 3.0000 mL | INTRAVENOUS | Status: DC | PRN

## 2016-11-05 MED ORDER — PANTOPRAZOLE SODIUM 40 MG PO TBEC: 40.0000 mg | DELAYED_RELEASE_TABLET | Freq: Every day | ORAL | 0 refills | Status: DC

## 2016-11-05 MED ORDER — PROPOFOL 500 MG/50ML IV EMUL
INTRAVENOUS | Status: DC | PRN
  Administered 2016-11-05: 40 ug/kg/min via INTRAVENOUS

## 2016-11-05 MED ORDER — LEVALBUTEROL HCL 0.63 MG/3ML IN NEBU: 0.6300 mg | INHALATION_SOLUTION | Freq: Four times a day (QID) | RESPIRATORY_TRACT | Status: DC

## 2016-11-05 MED ORDER — APIXABAN 5 MG PO TABS
5.0000 mg | ORAL_TABLET | Freq: Two times a day (BID) | ORAL | Status: DC
  Administered 2016-11-05: 5 mg via ORAL
  Filled 2016-11-05: qty 1

## 2016-11-05 MED ORDER — PHENYLEPHRINE HCL 10 MG/ML IJ SOLN
INTRAVENOUS | Status: DC | PRN
  Administered 2016-11-05: 10 ug/min via INTRAVENOUS

## 2016-11-05 MED ORDER — FENTANYL CITRATE (PF) 100 MCG/2ML IJ SOLN
INTRAMUSCULAR | Status: DC | PRN
  Administered 2016-11-05 (×2): 25 ug via INTRAVENOUS
  Administered 2016-11-05: 50 ug via INTRAVENOUS
  Administered 2016-11-05: 25 ug via INTRAVENOUS
  Administered 2016-11-05: 50 ug via INTRAVENOUS
  Administered 2016-11-05: 25 ug via INTRAVENOUS

## 2016-11-05 MED ORDER — PROTAMINE SULFATE 10 MG/ML IV SOLN
INTRAVENOUS | Status: DC | PRN
  Administered 2016-11-05: 30 mg via INTRAVENOUS

## 2016-11-05 MED ORDER — DEXTROSE 5 % IV SOLN
INTRAVENOUS | Status: DC | PRN
  Administered 2016-11-05: 20 ug/min via INTRAVENOUS

## 2016-11-05 MED ORDER — SODIUM CHLORIDE 0.9 % IV SOLN: 250.0000 mL | INTRAVENOUS | Status: DC | PRN

## 2016-11-05 MED ORDER — ONDANSETRON HCL 4 MG/2ML IJ SOLN: 4.0000 mg | Freq: Four times a day (QID) | INTRAMUSCULAR | Status: DC | PRN

## 2016-11-05 MED ORDER — DOFETILIDE 250 MCG PO CAPS: 250.0000 ug | ORAL_CAPSULE | Freq: Two times a day (BID) | ORAL | Status: DC

## 2016-11-05 MED ORDER — HEPARIN SODIUM (PORCINE) 1000 UNIT/ML IJ SOLN
INTRAMUSCULAR | Status: DC | PRN
  Administered 2016-11-05: 2000 [IU] via INTRAVENOUS
  Administered 2016-11-05: 4000 [IU] via INTRAVENOUS

## 2016-11-05 MED ORDER — HEPARIN SODIUM (PORCINE) 1000 UNIT/ML IJ SOLN
INTRAMUSCULAR | Status: DC | PRN
  Administered 2016-11-05: 12000 [IU] via INTRAVENOUS
  Administered 2016-11-05: 1000 [IU] via INTRAVENOUS

## 2016-11-05 MED ORDER — ACETAMINOPHEN 325 MG PO TABS: 650.0000 mg | ORAL_TABLET | ORAL | Status: DC | PRN

## 2016-11-05 SURGICAL SUPPLY — 17 items
BAG SNAP BAND KOVER 36X36 (MISCELLANEOUS) ×2 IMPLANT
BLANKET WARM UNDERBOD FULL ACC (MISCELLANEOUS) ×2 IMPLANT
CATH NAVISTAR SMARTTOUCH DF (ABLATOR) ×2 IMPLANT
CATH SOUNDSTAR 3D IMAGING (CATHETERS) ×2 IMPLANT
CATH VARIABLE LASSO NAV 2515 (CATHETERS) ×2 IMPLANT
CATH WEBSTER BI DIR CS D-F CRV (CATHETERS) ×2 IMPLANT
COVER SWIFTLINK CONNECTOR (BAG) ×2 IMPLANT
NEEDLE TRANSEP BRK 71CM 407200 (NEEDLE) ×2 IMPLANT
PACK EP LATEX FREE (CUSTOM PROCEDURE TRAY) ×1
PACK EP LF (CUSTOM PROCEDURE TRAY) ×1 IMPLANT
PAD DEFIB LIFELINK (PAD) ×2 IMPLANT
PATCH CARTO3 (PAD) ×2 IMPLANT
SHEATH AVANTI 11F 11CM (SHEATH) ×2 IMPLANT
SHEATH PINNACLE 7F 10CM (SHEATH) ×4 IMPLANT
SHEATH PINNACLE 9F 10CM (SHEATH) ×2 IMPLANT
SHEATH SWARTZ TS SL2 63CM 8.5F (SHEATH) ×2 IMPLANT
TUBING SMART ABLATE COOLFLOW (TUBING) ×2 IMPLANT

## 2016-11-05 NOTE — Interval H&P Note (Signed)
History and Physical Interval Note:  11/05/2016 7:22 AM  Elizabeth Mcconnell  has presented today for surgery, with the diagnosis of afib  The various methods of treatment have been discussed with the patient and family. After consideration of risks, benefits and other options for treatment, the patient has consented to  Procedure(s): Atrial Fibrillation Ablation (N/A) as a surgical intervention .  The patient's history has been reviewed, patient examined, no change in status, stable for surgery.  I have reviewed the patient's chart and labs.  Questions were answered to the patient's satisfaction.     Thompson Grayer

## 2016-11-05 NOTE — Discharge Summary (Signed)
ELECTROPHYSIOLOGY PROCEDURE DISCHARGE SUMMARY    Patient ID: Elizabeth Mcconnell,  MRN: [id number], DOB/AGE: [birth year] 71 y.o.  Admit date: 11/05/2016 Discharge date: 11/05/2016  Primary Care Physician: Janith Lima, MD Primary Cardiologist: Burt Knack Electrophysiologist: Thompson Grayer, MD  Primary Discharge Diagnosis:  Paroxysmal atrial fibrillation status post ablation this admission  Secondary Discharge Diagnosis:  1.  Atrial flutter s/p ablation 2.  HTN  Procedures This Admission:  1.  Electrophysiology study and radiofrequency catheter ablation on 11/05/16 by Dr Thompson Grayer.  This study demonstrated atrial fibrillation upon presentation; prodigious conduction within all four PVs at baseline.  The LSPV was the culprit for afib today; successful electrical isolation and anatomical encircling of all four pulmonary veins with radiofrequency current; no inducible arrhythmias following ablation both on and off of Isuprel; complete bidirectional CTI confirmed from a prior ablation procedure; no early apparent complications..    Brief HPI: Elizabeth Mcconnell is a 71 y.o. female with a history of paroxysmal atrial fibrillation.  They have failed medical therapy with Tikosyn. Risks, benefits, and alternatives to catheter ablation of atrial fibrillation were reviewed with the patient who wished to proceed.  The patient underwent cardiac CT prior to the procedure which demonstrated no LAA thrombus.    Hospital Course:  The patient was admitted and underwent EPS/RFCA of atrial fibrillation with details as outlined above.  They were monitored on telemetry which demonstrated sinus rhythm.  Groin was without complication on the day of discharge.  The patient was examined and considered to be stable for discharge.  Wound care and restrictions were reviewed with the patient.  The patient will be seen back by Roderic Palau, NP in 4 weeks and Dr Rayann Heman in 12 weeks for post ablation follow up.   This  patients CHA2DS2-VASc Score and unadjusted Ischemic Stroke Rate (% per year) is equal to 3.2 % stroke rate/year from a score of 3 Above score calculated as 1 point each if present [CHF, HTN, DM, Vascular=MI/PAD/Aortic Plaque, Age if 65-74, or Female] Above score calculated as 2 points each if present [Age > 75, or Stroke/TIA/TE]   Physical Exam: Vitals:   11/05/16 1235 11/05/16 1255 11/05/16 1325 11/05/16 1350  BP: 133/76 126/85 127/63 127/73  Pulse: 76 76 75 78  Resp: 16 16 20 17   Temp:      TempSrc:  Temporal    SpO2: 97% 95% 95% 94%  Weight:      Height:        GEN- The patient is well appearing, alert and oriented x 3 today.   HEENT: normocephalic, atraumatic; sclera clear, conjunctiva pink; hearing intact; oropharynx clear; neck supple  Lungs- Clear to ausculation bilaterally, normal work of breathing.  No wheezes, rales, rhonchi Heart- Regular rate and rhythm, no murmurs, rubs or gallops  GI- soft, non-tender, non-distended, bowel sounds present  Extremities- no clubbing, cyanosis, or edema; DP/PT/radial pulses 2+ bilaterally, groin without hematoma/bruit MS- no significant deformity or atrophy Skin- warm and dry, no rash or lesion Psych- euthymic mood, full affect Neuro- strength and sensation are intact   Labs:   Lab Results  Component Value Date   WBC 5.9 10/23/2016   HGB 13.3 10/23/2016   HCT 40.0 10/23/2016   MCV 86 10/23/2016   PLT 172 10/23/2016   No results for input(s): NA, K, CL, CO2, BUN, CREATININE, CALCIUM, PROT, BILITOT, ALKPHOS, ALT, AST, GLUCOSE in the last 168 hours.  Invalid input(s): LABALBU   Discharge Medications:  Allergies as of 11/05/2016  Reactions   Tramadol Other (See Comments)   Reaction:  Dizziness and weakness    Amoxicillin-pot Clavulanate Nausea And Vomiting, Other (See Comments)   Has patient had a PCN reaction causing immediate rash, facial/tongue/throat swelling, SOB or lightheadedness with hypotension: No Has patient had a  PCN reaction causing severe rash involving mucus membranes or skin necrosis: No Has patient had a PCN reaction that required hospitalization No Has patient had a PCN reaction occurring within the last 10 years: No If all of the above answers are "NO", then may proceed with Cephalosporin use.   Flagyl [metronidazole] Nausea And Vomiting   Flecainide Other (See Comments)   Dizziness    Omnicef [cefdinir] Nausea And Vomiting   Avelox [moxifloxacin] Hives, Itching      Medication List    TAKE these medications   acetaminophen 650 MG CR tablet Commonly known as:  TYLENOL Take 1,300 mg by mouth 2 (two) times daily as needed for pain.   ALPRAZolam 0.25 MG tablet Commonly known as:  XANAX Take 0.25 mg by mouth at bedtime as needed for sleep.   CALCIUM 600+D 600-800 MG-UNIT Tabs Generic drug:  Calcium Carb-Cholecalciferol Take 1 tablet by mouth daily.   cyanocobalamin 2000 MCG tablet Take 1 tablet (2,000 mcg total) by mouth daily.   diltiazem 300 MG 24 hr capsule Commonly known as:  CARDIZEM CD TAKE 1 CAPSULE BY MOUTH NIGHTLY AT BEDTIME   dofetilide 250 MCG capsule Commonly known as:  TIKOSYN Take 1 capsule (250 mcg total) by mouth 2 (two) times daily.   ELIQUIS 5 MG Tabs tablet Generic drug:  apixaban TAKE 1 TABLET BY MOUTH 2 TIMES DAILY   estradiol 0.05 MG/24HR patch Commonly known as:  VIVELLE-DOT Place 1 patch (0.05 mg total) onto the skin 2 (two) times a week.   fluticasone 110 MCG/ACT inhaler Commonly known as:  FLOVENT HFA Inhale 2 puffs into the lungs 2 (two) times daily.   fluticasone 50 MCG/ACT nasal spray Commonly known as:  FLONASE USE 2 SPRAYS IN EACH NOSTRIL 2 TIMES DAILY   hydrocortisone cream 1 % Apply 1 application topically daily as needed for itching.   levalbuterol 45 MCG/ACT inhaler Commonly known as:  XOPENEX HFA inhale 1 TO 2 PUFFS BY MOUTH EVERY 4 HOURS AS NEEDED What changed:  See the new instructions.   magnesium oxide 400 MG  tablet Commonly known as:  MAG-OX Take 1 tablet (400 mg total) by mouth daily.   montelukast 10 MG tablet Commonly known as:  SINGULAIR Take 1 tablet (10 mg total) by mouth daily.   pantoprazole 40 MG tablet Commonly known as:  PROTONIX Take 1 tablet (40 mg total) by mouth daily.            Discharge Care Instructions        Start     Ordered   11/05/16 0000  Increase activity slowly     11/05/16 1629   11/05/16 0000  Diet - low sodium heart healthy     11/05/16 1629   11/05/16 0000  pantoprazole (PROTONIX) 40 MG tablet  Daily     11/05/16 1710      Disposition:  Discharge Instructions    Diet - low sodium heart healthy    Complete by:  As directed    Increase activity slowly    Complete by:  As directed      Follow-up Information    [place] Follow up on 12/05/2016.   Specialty:  Cardiology Why:  at [facility] information: [address] [id number] [place] [phone]       Thompson Grayer, MD Follow up on 02/10/2017.   Specialty:  Cardiology Why:  at 10:45AM Contact information: [place] [address] [phone]           Duration of Discharge Encounter: Greater than 30 minutes including physician time.  Signed, Chanetta Marshall, NP 11/05/2016 5:10 PM   Thompson Grayer MD, [facility] 11/05/2016 5:10 PM

## 2016-11-05 NOTE — Progress Notes (Signed)
Resting without c/o. RFA site good - level 0. No hematoma, no ecchymosis noted. Siderails up x2 bed in lowest posit in-call bell within reach  waiting bed assignment.

## 2016-11-05 NOTE — Anesthesia Postprocedure Evaluation (Signed)
Anesthesia Post Note  Patient: Elizabeth Mcconnell  Procedure(s) Performed: Procedure(s) (LRB): Atrial Fibrillation Ablation (N/A)     Patient location during evaluation: Cath Lab Anesthesia Type: MAC Level of consciousness: awake and alert Pain management: pain level controlled Vital Signs Assessment: post-procedure vital signs reviewed and stable Respiratory status: spontaneous breathing, nonlabored ventilation, respiratory function stable and patient connected to nasal cannula oxygen Cardiovascular status: stable and blood pressure returned to baseline Postop Assessment: no signs of nausea or vomiting Anesthetic complications: no    Last Vitals:  Vitals:   11/05/16 1325 11/05/16 1350  BP: 127/63 127/73  Pulse: 75 78  Resp: 20 17  Temp:    SpO2: 95% 94%    Last Pain:  Vitals:   11/05/16 1256  TempSrc:   PainSc: 2                  [place] Flight

## 2016-11-05 NOTE — Transfer of Care (Signed)
Immediate Anesthesia Transfer of Care Note  Patient: [name] Jonas  Procedure(s) Performed: Procedure(s): Atrial Fibrillation Ablation (N/A)  Patient Location: Cath Lab  Anesthesia Type:MAC  Level of Consciousness: awake, alert , oriented and patient cooperative  Airway & Oxygen Therapy: Patient Spontanous Breathing and Patient connected to nasal cannula oxygen  Post-op Assessment: Report given to RN and Post -op Vital signs reviewed and stable  Post vital signs: Reviewed and stable  Last Vitals:  Vitals:   11/05/16 0540  BP: 122/73  Pulse: 79  Temp: 36.4 C  SpO2: 96%    Last Pain:  Vitals:   11/05/16 0540  TempSrc: Oral      Patients Stated Pain Goal: 4 (59/97/74 1423)  Complications: No apparent anesthesia complications

## 2016-11-05 NOTE — Discharge Instructions (Signed)
No driving for 4 days. No lifting over 5 lbs for 1 week. No sexual activity for 1 week. You may return to work in 1 week. Keep procedure site clean & dry. If you notice increased pain, swelling, bleeding or pus, call/return!  You may shower, but no soaking baths/hot tubs/pools for 1 week.  ° ° °You have an appointment set up with the Atrial Fibrillation Clinic.  Multiple studies have shown that being followed by a dedicated atrial fibrillation clinic in addition to the standard care you receive from your other physicians improves health. We believe that enrollment in the atrial fibrillation clinic will allow us to better care for you.  ° °The phone number to the Atrial Fibrillation Clinic is [phone]. The clinic is staffed Monday through Friday from 8:30am to 5pm. ° °Parking Directions: The clinic is located in the Heart and Vascular Building connected to [place] [facility]. °1)From [address] turn on to [address] and go to the 3rd entrance  (Heart and Vascular entrance) on the right. °2)Look to the right for Heart &Vascular Parking Garage. °3)A code for the entrance is required please call the clinic to receive this.   °4)Take the elevators to the 1st floor. Registration is in the room with the glass walls at the end of the hallway. ° °If you have any trouble parking or locating the clinic, please don’t hesitate to call [phone]. ° ° °

## 2016-11-05 NOTE — Anesthesia Preprocedure Evaluation (Signed)
Anesthesia Evaluation  Patient identified by MRN, date of birth, ID band Patient awake    Reviewed: Allergy & Precautions, NPO status , Patient's Chart, lab work & pertinent test results  History of Anesthesia Complications Negative for: history of anesthetic complications  Airway Mallampati: II  TM Distance: <3 FB Neck ROM: Full    Dental  (+) Teeth Intact, Dental Advisory Given   Pulmonary asthma , Current Smoker,    breath sounds clear to auscultation       Cardiovascular hypertension, Pt. on medications (-) angina(-) Past MI Normal cardiovascular exam+ dysrhythmias Atrial Fibrillation  Rhythm:Irregular Rate:Normal  failed cardioversion 11/2012; s/p ablation 02-02-2013 by Dr Rayann Heman EKG 05/02/15: NSR   Neuro/Psych PSYCHIATRIC DISORDERS Anxiety negative neurological ROS     GI/Hepatic negative GI ROS, Neg liver ROS,   Endo/Other  negative endocrine ROS  Renal/GU negative Renal ROS     Musculoskeletal  (+) Arthritis , Osteoarthritis,  Stopped Eliquis 05/02/15   Abdominal   Peds  Hematology  (+) Blood dyscrasia, anemia ,   Anesthesia Other Findings   Reproductive/Obstetrics                             Anesthesia Physical Anesthesia Plan  ASA: III  Anesthesia Plan: MAC   Post-op Pain Management:    Induction: Intravenous  PONV Risk Score and Plan: 1 and Ondansetron  Airway Management Planned: Nasal Cannula  Additional Equipment: None  Intra-op Plan:   Post-operative Plan:   Informed Consent: I have reviewed the patients History and Physical, chart, labs and discussed the procedure including the risks, benefits and alternatives for the proposed anesthesia with the patient or authorized representative who has indicated his/her understanding and acceptance.   Dental advisory given  Plan Discussed with: CRNA and Surgeon  Anesthesia Plan Comments:         Anesthesia Quick  Evaluation

## 2016-11-05 NOTE — Progress Notes (Signed)
Site area: rt groin fv sheaths X3 Site Prior to Removal:  Level 0 Pressure Applied For: 20 minutes Manual:   yes Patient Status During Pull:  stable Post Pull Site:  Level 0 Post Pull Instructions Given:  yes Post Pull Pulses Present:  palpable Dressing Applied:  Gauze and tegadetm Bedrest begins @ 1100 Comments: IV saline locked

## 2016-11-05 NOTE — H&P (View-Only) (Signed)
PCP: Janith Lima, MD Primary Cardiologist: Dr Burt Knack Primary EP: Dr Rayann Heman  Elizabeth Mcconnell is a 71 y.o. female who presents today for routine electrophysiology followup.  Since last being seen in our clinic, the patient reports doing very well.    Unfortunately, she continues to have frequent salvos of afib.  She also has documented post termination pauses with symptoms of presyncope.  Today, she denies symptoms of palpitations, chest pain, shortness of breath,  lower extremity edema,  or syncope.  The patient is otherwise without complaint today.   Past Medical History:  Diagnosis Date  . Adenomatous polyp of colon 10/2013   f/u colo 10/2018  . Allergic rhinitis   . ALLERGIC RHINITIS   . ANXIETY    "situational" (02/14/2015)  . Arthritis    "neck, back, hands" (02/14/2015)  . Asthma   . Atrial fibrillation with RVR ([place])   . Atrial flutter ([place])    failed cardioversion 11/2012; s/p ablation 02-02-2013 by Dr Rayann Heman  . Chronic bronchitis ([place])    "I'll have it most years" (02/14/2015)  . Diverticulosis   . Endometriosis   . Essential hypertension 05/30/2014  . Hepatic steatosis   . Oral candidiasis   . Osteopenia 08/2015   T score -1.7 FRAX 14%/4.4% overall stable from prior DEXA.  [name] Pneumothorax on left 1996   left lower lung collapse s/p resection  . Small bowel obstruction ([place])   . Tobacco abuse    Past Surgical History:  Procedure Laterality Date  . ABDOMINAL HYSTERECTOMY    . APPENDECTOMY  1978  . ATRIAL FLUTTER ABLATION N/A 02/02/2013   Procedure: ATRIAL FLUTTER ABLATION;  Surgeon: Coralyn Mark, MD;  Location: [facility];  Service: Cardiovascular;  Laterality: N/A;  . CARDIOVERSION N/A 11/27/2012   Procedure: CARDIOVERSION;  Surgeon: Thayer Headings, MD;  Location: [facility];  Service: Cardiovascular;  Laterality: N/A;  . [place]; 1978  . LAPAROSCOPIC PARTIAL COLECTOMY N/A 05/04/2015   Procedure: LAPAROSCOPIC LOW ANTERIOR RESECTION  LAPAROSCOPIC LYSIS OF ADHESIONS, SPLENIC FLEXURE MOBILIZATION;  Surgeon: Michael Boston, MD;  Location: [facility];  Service: General;  Laterality: N/A;  . LUNG REMOVAL, PARTIAL Left 02/1965   LLL  . TOTAL ABDOMINAL HYSTERECTOMY W/ BILATERAL SALPINGOOPHORECTOMY  1984   Endometriosis    ROS- all systems are reviewed and negatives except as per HPI above  Current Outpatient Prescriptions  Medication Sig Dispense Refill  . acetaminophen (TYLENOL) 650 MG CR tablet Take 650-1,300 mg by mouth at bedtime as needed for pain.     [name] ALPRAZolam (XANAX) 0.25 MG tablet Take 0.25 mg by mouth 2 (two) times daily as needed for anxiety.     . cyanocobalamin 2000 MCG tablet Take 1 tablet (2,000 mcg total) by mouth daily. 90 tablet 3  . diltiazem (CARDIZEM CD) 300 MG 24 hr capsule TAKE 1 CAPSULE BY MOUTH NIGHTLY AT BEDTIME 30 capsule 11  . dofetilide (TIKOSYN) 250 MCG capsule Take 1 capsule (250 mcg total) by mouth 2 (two) times daily. 14 capsule 0  . ELIQUIS 5 MG TABS tablet TAKE 1 TABLET BY MOUTH 2 TIMES DAILY 180 tablet 3  . estradiol (VIVELLE-DOT) 0.05 MG/24HR patch Place 1 patch (0.05 mg total) onto the skin 2 (two) times a week. 8 patch 12  . fluticasone (FLONASE) 50 MCG/ACT nasal spray USE 2 SPRAYS IN EACH NOSTRIL 2 TIMES DAILY 16 g 5  . fluticasone (FLOVENT HFA) 110 MCG/ACT inhaler Inhale 2 puffs into the lungs 2 (  two) times daily. 1 Inhaler 12  . levalbuterol (XOPENEX HFA) 45 MCG/ACT inhaler inhale 1 TO 2 PUFFS BY MOUTH EVERY 4 HOURS AS NEEDED 15 g 3  . magnesium oxide (MAG-OX) 400 MG tablet Take 1 tablet (400 mg total) by mouth daily. 30 tablet 6  . Melatonin 3 MG TABS Take 3 mg by mouth at bedtime as needed (for sleep).     . montelukast (SINGULAIR) 10 MG tablet Take 1 tablet (10 mg total) by mouth daily. 30 tablet 5   No current facility-administered medications for this visit.     Physical Exam: Vitals:   10/09/16 1029  BP: 124/78  Pulse: 80  SpO2: 96%  Weight: 151 lb (68.5 kg)  Height: 5\' 5"   (1.651 m)    GEN- The patient is well appearing, alert and oriented x 3 today.   Head- normocephalic, atraumatic Eyes-  Sclera clear, conjunctiva pink Ears- hearing intact Oropharynx- clear Lungs- Clear to ausculation bilaterally, normal work of breathing Heart- Regular rate and rhythm, no murmurs, rubs or gallops, PMI not laterally displaced GI- soft, NT, ND, + BS Extremities- no clubbing, cyanosis, or edema  EKG tracing ordered today is personally reviewed and shows afib with conversion to sinus bradycardia (similar to ekg from afib clinic)  Assessment and Plan:  1. Paroxysmal atrial fibrillation The patient has symptomatic atrial arrhythmias.  She is documented to have salvos of afib with symptomatic pauses.  She has failed medical therapy with tikosyn.  She is s/p prior CTI ablation. Therapeutic strategies for afib including medicine and ablation were discussed in detail with the patient today. Risk, benefits, and alternatives to EP study and radiofrequency ablation for afib were also discussed in detail today. These risks include but are not limited to stroke, bleeding, vascular damage, tamponade, perforation, damage to the esophagus, lungs, and other structures, pulmonary vein stenosis, worsening renal function, and death. The patient understands these risk and wishes to proceed.  We will therefore proceed with catheter ablation at the next available time.  Will plan cardiac CT prior to ablation to evaluate for LAA thrombus.  2. Symptomatic post termination pauses Documented on ekg She is clear that she would like to avoid PPM Hopefully these will resolve with afib ablation.  Thompson Grayer MD, [facility] 10/09/2016 10:54 AM

## 2016-11-06 ENCOUNTER — Telehealth: Payer: Self-pay | Admitting: *Deleted

## 2016-11-06 NOTE — Telephone Encounter (Signed)
Pt was on TCM list admitted 11/05/16 for Paroxysmal atrial fibrillation status post ablation this admission. Pt was d/c on the same day 11/05/16, and will follow-up w/

## 2016-11-06 NOTE — Telephone Encounter (Signed)
Pt will be f/u w/cardiology on 12/05/16...Elizabeth Mcconnell

## 2016-11-11 ENCOUNTER — Encounter (HOSPITAL_COMMUNITY): Payer: Self-pay | Admitting: *Deleted

## 2016-11-11 ENCOUNTER — Other Ambulatory Visit: Payer: Self-pay

## 2016-11-11 ENCOUNTER — Inpatient Hospital Stay (HOSPITAL_COMMUNITY)
Admission: EM | Admit: 2016-11-11 | Discharge: 2016-11-14 | DRG: 390 | Disposition: A | Discharge status: Home / Self Care | Payer: Medicare Other | Attending: Nephrology | Admitting: Nephrology

## 2016-11-11 DIAGNOSIS — Z8249 Family history of ischemic heart disease and other diseases of the circulatory system: Secondary | ICD-10-CM

## 2016-11-11 DIAGNOSIS — Z90722 Acquired absence of ovaries, bilateral: Secondary | ICD-10-CM

## 2016-11-11 DIAGNOSIS — Z9889 Other specified postprocedural states: Secondary | ICD-10-CM

## 2016-11-11 DIAGNOSIS — Z825 Family history of asthma and other chronic lower respiratory diseases: Secondary | ICD-10-CM

## 2016-11-11 DIAGNOSIS — Z888 Allergy status to other drugs, medicaments and biological substances status: Secondary | ICD-10-CM

## 2016-11-11 DIAGNOSIS — I480 Paroxysmal atrial fibrillation: Secondary | ICD-10-CM | POA: Diagnosis not present

## 2016-11-11 DIAGNOSIS — K5651 Intestinal adhesions [bands], with partial obstruction: Principal | ICD-10-CM | POA: Diagnosis present

## 2016-11-11 DIAGNOSIS — E876 Hypokalemia: Secondary | ICD-10-CM

## 2016-11-11 DIAGNOSIS — Z9049 Acquired absence of other specified parts of digestive tract: Secondary | ICD-10-CM

## 2016-11-11 DIAGNOSIS — J45909 Unspecified asthma, uncomplicated: Secondary | ICD-10-CM | POA: Diagnosis present

## 2016-11-11 DIAGNOSIS — K56609 Unspecified intestinal obstruction, unspecified as to partial versus complete obstruction: Secondary | ICD-10-CM

## 2016-11-11 DIAGNOSIS — Z833 Family history of diabetes mellitus: Secondary | ICD-10-CM

## 2016-11-11 DIAGNOSIS — Z0189 Encounter for other specified special examinations: Secondary | ICD-10-CM

## 2016-11-11 DIAGNOSIS — Z90710 Acquired absence of both cervix and uterus: Secondary | ICD-10-CM

## 2016-11-11 DIAGNOSIS — F17210 Nicotine dependence, cigarettes, uncomplicated: Secondary | ICD-10-CM | POA: Diagnosis present

## 2016-11-11 DIAGNOSIS — Z7901 Long term (current) use of anticoagulants: Secondary | ICD-10-CM

## 2016-11-11 DIAGNOSIS — Z823 Family history of stroke: Secondary | ICD-10-CM

## 2016-11-11 DIAGNOSIS — Z7951 Long term (current) use of inhaled steroids: Secondary | ICD-10-CM

## 2016-11-11 DIAGNOSIS — Z880 Allergy status to penicillin: Secondary | ICD-10-CM

## 2016-11-11 DIAGNOSIS — Z79899 Other long term (current) drug therapy: Secondary | ICD-10-CM

## 2016-11-11 DIAGNOSIS — R002 Palpitations: Secondary | ICD-10-CM | POA: Diagnosis present

## 2016-11-11 DIAGNOSIS — K56600 Partial intestinal obstruction, unspecified as to cause: Secondary | ICD-10-CM | POA: Diagnosis present

## 2016-11-11 DIAGNOSIS — I10 Essential (primary) hypertension: Secondary | ICD-10-CM | POA: Diagnosis present

## 2016-11-11 DIAGNOSIS — Z902 Acquired absence of lung [part of]: Secondary | ICD-10-CM

## 2016-11-11 LAB — CBC
HCT: 44.1 % (ref 36.0–46.0)
Hemoglobin: 14.3 g/dL (ref 12.0–15.0)
MCH: 28.4 pg (ref 26.0–34.0)
MCHC: 32.4 g/dL (ref 30.0–36.0)
MCV: 87.5 fL (ref 78.0–100.0)
Platelets: 200 10*3/uL (ref 150–400)
RBC: 5.04 MIL/uL (ref 3.87–5.11)
RDW: 13.6 % (ref 11.5–15.5)
WBC: 11.2 10*3/uL — ABNORMAL HIGH (ref 4.0–10.5)

## 2016-11-11 LAB — COMPREHENSIVE METABOLIC PANEL
ALT: 16 U/L (ref 14–54)
AST: 18 U/L (ref 15–41)
Albumin: 4.2 g/dL (ref 3.5–5.0)
Alkaline Phosphatase: 70 U/L (ref 38–126)
Anion gap: 11 (ref 5–15)
BUN: 12 mg/dL (ref 6–20)
CO2: 21 mmol/L — ABNORMAL LOW (ref 22–32)
Calcium: 9.4 mg/dL (ref 8.9–10.3)
Chloride: 105 mmol/L (ref 101–111)
Creatinine, Ser: 0.72 mg/dL (ref 0.44–1.00)
GFR calc Af Amer: >60 mL/min (ref >60)
GFR calc non Af Amer: >60 mL/min (ref >60)
Glucose, Bld: 103 mg/dL — ABNORMAL HIGH (ref 65–99)
Potassium: 4 mmol/L (ref 3.5–5.1)
Sodium: 137 mmol/L (ref 135–145)
Total Bilirubin: 0.7 mg/dL (ref 0.3–1.2)
Total Protein: 7.1 g/dL (ref 6.5–8.1)

## 2016-11-11 LAB — I-STAT CG4 LACTIC ACID, ED: Lactic Acid, Venous: 1.58 mmol/L (ref 0.5–1.9)

## 2016-11-11 MED ORDER — FENTANYL CITRATE (PF) 100 MCG/2ML IJ SOLN
50.0000 ug | INTRAMUSCULAR | Status: AC | PRN
Start: 2016-11-11 — End: 2016-11-11
  Administered 2016-11-11 (×2): 50 ug via INTRAVENOUS
  Filled 2016-11-11: qty 2

## 2016-11-11 MED ORDER — ONDANSETRON HCL 4 MG/2ML IJ SOLN
4.0000 mg | Freq: Once | INTRAMUSCULAR | Status: AC
  Administered 2016-11-11: 4 mg via INTRAVENOUS

## 2016-11-11 MED ORDER — ONDANSETRON HCL 4 MG/2ML IJ SOLN
INTRAMUSCULAR | Status: AC
  Filled 2016-11-11: qty 2

## 2016-11-11 MED ORDER — FENTANYL CITRATE (PF) 100 MCG/2ML IJ SOLN
INTRAMUSCULAR | Status: AC
  Administered 2016-11-11: 50 ug via INTRAVENOUS
  Filled 2016-11-11: qty 2

## 2016-11-11 NOTE — ED Triage Notes (Signed)
Pt c/o generalized abd pain onset at 5pm with nausea, denies vomiting, last BM this morning. Hx of SBO in the past. Also had an ablation last Tuesday for afib

## 2016-11-11 NOTE — ED Notes (Signed)
Pt vomiting two bags of light brown emesis (~500cc)

## 2016-11-11 NOTE — ED Provider Notes (Signed)
[facility] DEPT Provider Note   CSN: [id number] Arrival date & time: 11/11/16  2224     History   Chief Complaint Chief Complaint  Patient presents with  . Abdominal Pain    HPI Elizabeth Mcconnell is a 71 y.o. female.  HPI  This is a very pleasant 71 year old female with a history of colon resection, small bowel traction, atrial fibrillation status post ablation 1 week ago who presents with vomiting. Patient reports 3-4 hour history of nonbilious, nonbloody emesis. She reports crampy lower abdominal pain which was "intense" prior to arrival. However, she was given pain medication and states she feels much better. Last bowel movement was earlier this morning. Patient had resection of her colon in March 2017. She subsequently had a bowel obstruction that was medically managed in March 2018. She states that this feels somewhat similar. Denies any chest pain, shortness breath, fevers. She did state that she felt palpitations earlier but now feels improved.  Past Medical History:  Diagnosis Date  . Adenomatous polyp of colon 10/2013   f/u colo 10/2018  . Allergic rhinitis   . ALLERGIC RHINITIS   . ANXIETY    "situational" (02/14/2015)  . Arthritis    "neck, back, hands" (02/14/2015)  . Asthma   . Atrial fibrillation with RVR ([place])   . Atrial flutter ([place])    failed cardioversion 11/2012; s/p ablation 02-02-2013 by Dr Rayann Heman  . Chronic bronchitis ([place])    "I'll have it most years" (02/14/2015)  . Diverticulosis   . Endometriosis   . Essential hypertension 05/30/2014  . Hepatic steatosis   . Oral candidiasis   . Osteopenia 08/2015   T score -1.7 FRAX 14%/4.4% overall stable from prior DEXA.  [name] Pneumothorax on left 1996   left lower lung collapse s/p resection  . Small bowel obstruction ([place])   . Tobacco abuse     Patient Active Problem List   Diagnosis Date Noted  . Atrial fibrillation ([place]) 11/05/2016  . Rash 09/05/2016  . Moderate persistent asthma with exacerbation  08/02/2016  . Abdominal pain 05/09/2016  . SBO (small bowel obstruction) ([place]) 05/09/2016  . Tobacco abuse 05/09/2016  . Other fatigue 07/19/2015  . B12 nutritional deficiency 05/30/2015  . 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 Paroxysmal atrial fibrillation ([place]) 02/14/2015  . CN (constipation) 10/31/2014  . Hyperlipidemia with target LDL less than 160 06/13/2014  . Atrial fibrillation with RVR ([place])   . Essential hypertension 05/30/2014  . Atrial flutter ([place]) 01/23/2013  . OA (osteoarthritis)   . GAD (generalized anxiety disorder) 08/08/2008  . ALLERGIC RHINITIS 08/08/2008  . Asthma, moderate persistent 01/15/2007    Past Surgical History:  Procedure Laterality Date  . ABDOMINAL HYSTERECTOMY    . APPENDECTOMY  1978  . ATRIAL FIBRILLATION ABLATION N/A 11/05/2016   Procedure: Atrial Fibrillation Ablation;  Surgeon: Thompson Grayer, MD;  Location: [facility];  Service: Cardiovascular;  Laterality: N/A;  . ATRIAL FLUTTER ABLATION N/A 02/02/2013   Procedure: ATRIAL FLUTTER ABLATION;  Surgeon: Coralyn Mark, MD;  Location: [facility];  Service: Cardiovascular;  Laterality: N/A;  . CARDIOVERSION N/A 11/27/2012   Procedure: CARDIOVERSION;  Surgeon: Thayer Headings, MD;  Location: [facility];  Service: Cardiovascular;  Laterality: N/A;  . [place]; 1978  . LAPAROSCOPIC PARTIAL COLECTOMY N/A 05/04/2015   Procedure: LAPAROSCOPIC LOW ANTERIOR RESECTION LAPAROSCOPIC LYSIS OF ADHESIONS, SPLENIC FLEXURE MOBILIZATION;  Surgeon: Michael Boston, MD;  Location: [facility];  Service: General;  Laterality: N/A;  . LUNG REMOVAL, PARTIAL Left 02/1965   LLL  . TOTAL ABDOMINAL HYSTERECTOMY W/ BILATERAL SALPINGOOPHORECTOMY  1984   Endometriosis    OB History    Gravida Para Term Preterm AB Living   2 2 2     2    SAB TAB Ectopic Multiple Live Births                   Home Medications    Prior to Admission medications   Medication  Sig Start Date End Date Taking? Authorizing Provider  acetaminophen (TYLENOL) 650 MG CR tablet Take 1,300 mg by mouth 2 (two) times daily as needed for pain.    Yes [provider]  ALPRAZolam (XANAX) 0.25 MG tablet Take 0.25 mg by mouth at bedtime as needed for sleep.    Yes [provider]  Calcium Carb-Cholecalciferol (CALCIUM 600+D) 600-800 MG-UNIT TABS Take 1 tablet by mouth daily.   Yes [provider]  cyanocobalamin 2000 MCG tablet Take 1 tablet (2,000 mcg total) by mouth daily. 06/05/15  Yes Janith Lima, MD  dofetilide (TIKOSYN) 250 MCG capsule Take 1 capsule (250 mcg total) by mouth 2 (two) times daily. 02/17/15  Yes Seiler, Amber K, NP  ELIQUIS 5 MG TABS tablet TAKE 1 TABLET BY MOUTH 2 TIMES DAILY 07/17/16  Yes Allred, Jeneen Rinks, MD  estradiol (VIVELLE-DOT) 0.05 MG/24HR patch Place 1 patch (0.05 mg total) onto the skin 2 (two) times a week. 08/08/16  Yes Fontaine, Belinda Block, MD  fluticasone (FLONASE) 50 MCG/ACT nasal spray USE 2 SPRAYS IN EACH NOSTRIL 2 TIMES DAILY 09/30/16  Yes Javier Glazier, MD  fluticasone (FLOVENT HFA) 110 MCG/ACT inhaler Inhale 2 puffs into the lungs 2 (two) times daily. 11/28/15  Yes Janith Lima, MD  levalbuterol (XOPENEX HFA) 45 MCG/ACT inhaler inhale 1 TO 2 PUFFS BY MOUTH EVERY 4 HOURS AS NEEDED Patient taking differently: inhale 1 TO 2 PUFFS BY MOUTH EVERY 4 HOURS AS NEEDED FOR SHORTNESS OF BREATH 07/17/16  Yes Javier Glazier, MD  magnesium oxide (MAG-OX) 400 MG tablet Take 1 tablet (400 mg total) by mouth daily. 02/13/15  Yes Sherren Mocha, MD  montelukast (SINGULAIR) 10 MG tablet Take 1 tablet (10 mg total) by mouth daily. 03/14/16  Yes Javier Glazier, MD  pantoprazole (PROTONIX) 40 MG tablet Take 1 tablet (40 mg total) by mouth daily. 11/05/16 12/20/16 Yes Allred, Jeneen Rinks, MD  diltiazem (CARDIZEM CD) 300 MG 24 hr capsule TAKE 1 CAPSULE BY MOUTH NIGHTLY AT BEDTIME Patient not taking: Reported on 11/12/2016 04/10/16   Sherren Mocha, MD    Family History Family History  Problem Relation Age of Onset  . Asthma Maternal Grandfather   . Stroke Father 56  . Hypertension Father   . Heart disease Father   . Diabetes Mother   . Asthma Child   . Colon cancer Neg Hx   . Rectal cancer Neg Hx   . Stomach cancer Neg Hx     Social History Social History  Substance Use Topics  . Smoking status: Current Some Day Smoker    Packs/day: 0.25    Years: 43.00    Types: Cigarettes  . Smokeless tobacco: Never Used  . Alcohol use 8.4 oz/week    14 Standard drinks or equivalent per week     Comment: daily wine     Allergies   Tramadol; Amoxicillin-pot clavulanate; Flagyl [metronidazole]; Flecainide; Omnicef [cefdinir]; and Avelox [moxifloxacin]   Review  of Systems Review of Systems  Constitutional: Negative for fever.  Respiratory: Negative for shortness of breath.   Cardiovascular: Positive for palpitations. Negative for chest pain and leg swelling.  Gastrointestinal: Positive for abdominal pain, nausea and vomiting. Negative for constipation and diarrhea.  Genitourinary: Negative for dysuria.  All other systems reviewed and are negative.    Physical Exam Updated Vital Signs BP (!) 161/94   Pulse 97   Temp 98.4 F (36.9 C)   Resp 18   SpO2 92%   Physical Exam  Constitutional: She is oriented to person, place, and time. She appears well-developed and well-nourished. No distress.  HENT:  Head: Normocephalic and atraumatic.  Cardiovascular: Normal rate, regular rhythm and normal heart sounds.   Pulmonary/Chest: Effort normal. No respiratory distress. She has no wheezes.  Abdominal: Soft. She exhibits no distension. There is no rebound and no guarding.  Hyperactive bowel sounds, mild tenderness palpation right upper and lower quadrant without rebound or guarding  Neurological: She is alert and oriented to person, place, and time.  Skin: Skin is warm and dry.  Bruising in the right groin and  suprapubic region  Psychiatric: She has a normal mood and affect.  Nursing note and vitals reviewed.    ED Treatments / Results  Labs (all labs ordered are listed, but only abnormal results are displayed) Labs Reviewed  COMPREHENSIVE METABOLIC PANEL - Abnormal; Notable for the following:       Result Value   CO2 21 (*)    Glucose, Bld 103 (*)    All other components within normal limits  CBC - Abnormal; Notable for the following:    WBC 11.2 (*)    All other components within normal limits  URINALYSIS, ROUTINE W REFLEX MICROSCOPIC  I-STAT CG4 LACTIC ACID, ED  I-STAT CG4 LACTIC ACID, ED    EKG  EKG Interpretation  Date/Time:  Monday November 11 2016 22:53:39 EDT Ventricular Rate:  148 PR Interval:    QRS Duration: 78 QT Interval:  304 QTC Calculation: 477 R Axis:   18 Text Interpretation:  Atrial fibrillation with rapid ventricular response with premature ventricular or aberrantly conducted complexes Nonspecific ST and T wave abnormality , probably digitalis effect Abnormal ECG Confirmed by Thayer Jew [phone]) on 11/11/2016 11:30:20 PM       EKG Interpretation  Date/Time:  Monday November 11 2016 23:18:58 EDT Ventricular Rate:  97 PR Interval:    QRS Duration: 102 QT Interval:  370 QTC Calculation: 470 R Axis:   -20 Text Interpretation:  Sinus rhythm LAE, consider biatrial enlargement Borderline left axis deviation Low voltage, precordial leads RSR' in V1 or V2, right VCD or RVH Confirmed by Thayer Jew [phone]) on 11/11/2016 11:31:03 PM        Radiology Ct Abdomen Pelvis W Contrast  Result Date: 11/12/2016 CLINICAL DATA:  Lower abdominal pain beginning this evening. History of small bowel obstruction from adhesions, partial colectomy for diverticulitis and hysterectomy. EXAM: CT ABDOMEN AND PELVIS WITH CONTRAST TECHNIQUE: Multidetector CT imaging of the abdomen and pelvis was performed using the standard protocol following bolus administration of  intravenous contrast. CONTRAST:  157mL ISOVUE-300 IOPAMIDOL (ISOVUE-300) INJECTION 61% COMPARISON:  Acute abdominal series May 11, 2016 and CT abdomen and pelvis May 09, 2016 FINDINGS: LOWER CHEST: Lung bases are clear. Included heart size is normal. No pericardial effusion. HEPATOBILIARY: Liver and gallbladder are normal. PANCREAS: Normal. SPLEEN: Normal. ADRENALS/URINARY TRACT: Kidneys are orthotopic, demonstrating symmetric enhancement. No nephrolithiasis, hydronephrosis or solid renal masses. The unopacified  ureters are normal in course and caliber. Delayed imaging through the kidneys demonstrates symmetric prompt contrast excretion within the proximal urinary collecting system. Urinary bladder is partially distended and unremarkable. Normal adrenal glands. STOMACH/BOWEL: Mildly dilated small bowel to 3 cm with small bowel feces. Small bowel transition point in central pelvis. Mild associated small bowel wall thickening and inflammation. Sigmoid bowel anastomosis. Mild colonic diverticulosis. The appendix is not discretely identified, however there are no inflammatory changes in the right lower quadrant. Small hiatal hernia. Small duodenal diverticulum within third portion with debris. Normal appendix. VASCULAR/LYMPHATIC: Aortoiliac vessels are normal in course and caliber. Moderate calcific atherosclerosis. No lymphadenopathy by CT size criteria. REPRODUCTIVE: Status post hysterectomy.  Small Bartholin cyst. OTHER: No intraperitoneal free fluid or free air. MUSCULOSKELETAL: Small fat containing inguinal hernias. Focal fat stranding in RIGHT inguinal soft tissues. Grade 1 L4-5 anterolisthesis without spondylolysis. Severe canal stenosis L4-5. Osteopenia. Severe lower lumbar facet arthropathy. IMPRESSION: 1. Recurrent early versus partial small bowel obstruction, transition point in the pelvis consistent with the adhesion. 2. RIGHT inguinal subcutaneous fat stranding seen with cellulitis for contusion.  Recommend direct inspection. 3. Grade 1 L4-5 anterolisthesis on degenerative basis. Severe L4-5 canal stenosis . Aortic Atherosclerosis (ICD10-I70.0). Electronically Signed   By: Elon Alas M.D.   On: 11/12/2016 00:48    Procedures Procedures (including critical care time)  Medications Ordered in ED Medications  fentaNYL (SUBLIMAZE) injection 50 mcg (50 mcg Intravenous Given 11/11/16 2348)  ondansetron (ZOFRAN) injection 4 mg (4 mg Intravenous Given 11/11/16 2257)  iopamidol (ISOVUE-300) 61 % injection (100 mLs Intravenous Contrast Given 11/12/16 0016)  morphine 4 MG/ML injection 4 mg (4 mg Intravenous Given 11/12/16 0147)  ondansetron (ZOFRAN) injection 4 mg (4 mg Intravenous Given 11/12/16 0147)  0.9 %  sodium chloride infusion ( Intravenous New Bag/Given 11/12/16 0147)     Initial Impression / Assessment and Plan / ED Course  I have reviewed the triage vital signs and the nursing notes.  Pertinent labs & imaging results that were available during my care of the patient were reviewed by me and considered in my medical decision making (see chart for details).     Patient presents with abdominal pain and vomiting. History of obstruction. She is currently comfortable appearing. Initially was in A. fib with RVR but now is in normal sinus rhythm without intervention. Tender on exam without signs of peritonitis. I directed bowel sounds. Patient antinausea medication. CT scan obtained. Shows likely early bowel obstruction. Will place NG tube and admit to the hospitalist.   2:12 AM  Also discussed with general surgery, Dr. Kieth Brightly. General surgery to evaluate the patient later this morning.     Final Clinical Impressions(s) / ED Diagnoses   Final diagnoses:  SBO (small bowel obstruction) (HCC)  Paroxysmal atrial fibrillation [facility])    New Prescriptions New Prescriptions   No medications on file     Merryl Hacker, MD 11/12/16 [phone]

## 2016-11-12 ENCOUNTER — Inpatient Hospital Stay (HOSPITAL_COMMUNITY): Payer: Medicare Other

## 2016-11-12 ENCOUNTER — Emergency Department (HOSPITAL_COMMUNITY): Payer: Medicare Other

## 2016-11-12 DIAGNOSIS — Z9889 Other specified postprocedural states: Secondary | ICD-10-CM | POA: Diagnosis not present

## 2016-11-12 DIAGNOSIS — I480 Paroxysmal atrial fibrillation: Secondary | ICD-10-CM | POA: Diagnosis present

## 2016-11-12 DIAGNOSIS — Z902 Acquired absence of lung [part of]: Secondary | ICD-10-CM | POA: Diagnosis not present

## 2016-11-12 DIAGNOSIS — Z833 Family history of diabetes mellitus: Secondary | ICD-10-CM | POA: Diagnosis not present

## 2016-11-12 DIAGNOSIS — Z880 Allergy status to penicillin: Secondary | ICD-10-CM | POA: Diagnosis not present

## 2016-11-12 DIAGNOSIS — Z7951 Long term (current) use of inhaled steroids: Secondary | ICD-10-CM | POA: Diagnosis not present

## 2016-11-12 DIAGNOSIS — K56600 Partial intestinal obstruction, unspecified as to cause: Secondary | ICD-10-CM | POA: Diagnosis present

## 2016-11-12 DIAGNOSIS — I10 Essential (primary) hypertension: Secondary | ICD-10-CM | POA: Diagnosis present

## 2016-11-12 DIAGNOSIS — R002 Palpitations: Secondary | ICD-10-CM | POA: Diagnosis present

## 2016-11-12 DIAGNOSIS — Z9049 Acquired absence of other specified parts of digestive tract: Secondary | ICD-10-CM | POA: Diagnosis not present

## 2016-11-12 DIAGNOSIS — Z825 Family history of asthma and other chronic lower respiratory diseases: Secondary | ICD-10-CM | POA: Diagnosis not present

## 2016-11-12 DIAGNOSIS — F17210 Nicotine dependence, cigarettes, uncomplicated: Secondary | ICD-10-CM | POA: Diagnosis present

## 2016-11-12 DIAGNOSIS — Z888 Allergy status to other drugs, medicaments and biological substances status: Secondary | ICD-10-CM | POA: Diagnosis not present

## 2016-11-12 DIAGNOSIS — Z823 Family history of stroke: Secondary | ICD-10-CM | POA: Diagnosis not present

## 2016-11-12 DIAGNOSIS — Z90710 Acquired absence of both cervix and uterus: Secondary | ICD-10-CM | POA: Diagnosis not present

## 2016-11-12 DIAGNOSIS — Z8249 Family history of ischemic heart disease and other diseases of the circulatory system: Secondary | ICD-10-CM | POA: Diagnosis not present

## 2016-11-12 DIAGNOSIS — K5651 Intestinal adhesions [bands], with partial obstruction: Secondary | ICD-10-CM | POA: Diagnosis present

## 2016-11-12 DIAGNOSIS — Z7901 Long term (current) use of anticoagulants: Secondary | ICD-10-CM | POA: Diagnosis not present

## 2016-11-12 DIAGNOSIS — J45909 Unspecified asthma, uncomplicated: Secondary | ICD-10-CM | POA: Diagnosis present

## 2016-11-12 DIAGNOSIS — E876 Hypokalemia: Secondary | ICD-10-CM | POA: Diagnosis present

## 2016-11-12 DIAGNOSIS — Z79899 Other long term (current) drug therapy: Secondary | ICD-10-CM | POA: Diagnosis not present

## 2016-11-12 DIAGNOSIS — Z90722 Acquired absence of ovaries, bilateral: Secondary | ICD-10-CM | POA: Diagnosis not present

## 2016-11-12 LAB — BASIC METABOLIC PANEL
Anion gap: 7 (ref 5–15)
BUN: 10 mg/dL (ref 6–20)
CO2: 23 mmol/L (ref 22–32)
Calcium: 8.4 mg/dL — ABNORMAL LOW (ref 8.9–10.3)
Chloride: 110 mmol/L (ref 101–111)
Creatinine, Ser: 0.68 mg/dL (ref 0.44–1.00)
GFR calc Af Amer: >60 mL/min (ref >60)
GFR calc non Af Amer: >60 mL/min (ref >60)
Glucose, Bld: 99 mg/dL (ref 65–99)
Potassium: 3.8 mmol/L (ref 3.5–5.1)
Sodium: 140 mmol/L (ref 135–145)

## 2016-11-12 LAB — URINALYSIS, ROUTINE W REFLEX MICROSCOPIC
Bilirubin Urine: NEGATIVE
Glucose, UA: NEGATIVE mg/dL
Hgb urine dipstick: NEGATIVE
Ketones, ur: NEGATIVE mg/dL
Leukocytes, UA: NEGATIVE
Nitrite: NEGATIVE
Protein, ur: NEGATIVE mg/dL
Specific Gravity, Urine: 1.045 — ABNORMAL HIGH (ref 1.005–1.030)
pH: 7 (ref 5.0–8.0)

## 2016-11-12 LAB — CBC
HCT: 39.1 % (ref 36.0–46.0)
Hemoglobin: 12.3 g/dL (ref 12.0–15.0)
MCH: 27.8 pg (ref 26.0–34.0)
MCHC: 31.5 g/dL (ref 30.0–36.0)
MCV: 88.5 fL (ref 78.0–100.0)
Platelets: 178 10*3/uL (ref 150–400)
RBC: 4.42 MIL/uL (ref 3.87–5.11)
RDW: 13.8 % (ref 11.5–15.5)
WBC: 7.4 10*3/uL (ref 4.0–10.5)

## 2016-11-12 LAB — I-STAT CG4 LACTIC ACID, ED: Lactic Acid, Venous: 0.8 mmol/L (ref 0.5–1.9)

## 2016-11-12 LAB — APTT
aPTT: 198 seconds (ref 24–36)
aPTT: 79 seconds — ABNORMAL HIGH (ref 24–36)

## 2016-11-12 LAB — HEPARIN LEVEL (UNFRACTIONATED): Heparin Unfractionated: 1.14 IU/mL — ABNORMAL HIGH (ref 0.30–0.70)

## 2016-11-12 MED ORDER — IOPAMIDOL (ISOVUE-300) INJECTION 61%
INTRAVENOUS | Status: AC
  Administered 2016-11-12: 100 mL via INTRAVENOUS
  Filled 2016-11-12: qty 100

## 2016-11-12 MED ORDER — FENTANYL CITRATE (PF) 100 MCG/2ML IJ SOLN
25.0000 ug | INTRAMUSCULAR | Status: AC | PRN
  Administered 2016-11-12 (×2): 25 ug via INTRAVENOUS
  Filled 2016-11-12 (×2): qty 2

## 2016-11-12 MED ORDER — HEPARIN BOLUS VIA INFUSION
4000.0000 [IU] | Freq: Once | INTRAVENOUS | Status: AC
  Administered 2016-11-12: 4000 [IU] via INTRAVENOUS
  Filled 2016-11-12: qty 4000

## 2016-11-12 MED ORDER — SODIUM CHLORIDE 0.9 % IV SOLN
Freq: Once | INTRAVENOUS | Status: AC
  Administered 2016-11-12: 02:00:00 via INTRAVENOUS

## 2016-11-12 MED ORDER — DIATRIZOATE MEGLUMINE & SODIUM 66-10 % PO SOLN
90.0000 mL | Freq: Once | ORAL | Status: AC
  Administered 2016-11-12: 90 mL via NASOGASTRIC
  Filled 2016-11-12: qty 90

## 2016-11-12 MED ORDER — SODIUM CHLORIDE 0.9 % IV SOLN
INTRAVENOUS | Status: DC
  Administered 2016-11-12: 75 mL/h via INTRAVENOUS
  Administered 2016-11-13 – 2016-11-14 (×3): via INTRAVENOUS

## 2016-11-12 MED ORDER — MORPHINE SULFATE (PF) 4 MG/ML IV SOLN
2.0000 mg | INTRAVENOUS | Status: DC | PRN
  Administered 2016-11-12 – 2016-11-13 (×3): 2 mg via INTRAVENOUS
  Filled 2016-11-12 (×3): qty 1

## 2016-11-12 MED ORDER — MORPHINE SULFATE (PF) 4 MG/ML IV SOLN
4.0000 mg | Freq: Once | INTRAVENOUS | Status: AC
  Administered 2016-11-12: 4 mg via INTRAVENOUS
  Filled 2016-11-12: qty 1

## 2016-11-12 MED ORDER — ALBUTEROL SULFATE (2.5 MG/3ML) 0.083% IN NEBU
2.5000 mg | INHALATION_SOLUTION | Freq: Four times a day (QID) | RESPIRATORY_TRACT | Status: DC | PRN
Start: 2016-11-12 — End: 2016-11-14

## 2016-11-12 MED ORDER — PHENOL 1.4 % MT LIQD
1.0000 | OROMUCOSAL | Status: DC | PRN
  Administered 2016-11-12: 1 via OROMUCOSAL
  Filled 2016-11-12: qty 177

## 2016-11-12 MED ORDER — ONDANSETRON HCL 4 MG/2ML IJ SOLN: 4.0000 mg | Freq: Four times a day (QID) | INTRAMUSCULAR | Status: DC | PRN

## 2016-11-12 MED ORDER — ONDANSETRON HCL 4 MG/2ML IJ SOLN
4.0000 mg | Freq: Once | INTRAMUSCULAR | Status: AC
  Administered 2016-11-12: 4 mg via INTRAVENOUS
  Filled 2016-11-12: qty 2

## 2016-11-12 MED ORDER — ONDANSETRON HCL 4 MG PO TABS: 4.0000 mg | ORAL_TABLET | Freq: Four times a day (QID) | ORAL | Status: DC | PRN

## 2016-11-12 MED ORDER — LEVALBUTEROL TARTRATE 45 MCG/ACT IN AERO: 1.0000 | INHALATION_SPRAY | Freq: Four times a day (QID) | RESPIRATORY_TRACT | Status: DC | PRN

## 2016-11-12 MED ORDER — HEPARIN (PORCINE) IN NACL 100-0.45 UNIT/ML-% IJ SOLN
1000.0000 [IU]/h | INTRAMUSCULAR | Status: DC
  Administered 2016-11-12: 1000 [IU]/h via INTRAVENOUS
  Filled 2016-11-12 (×3): qty 250

## 2016-11-12 NOTE — Progress Notes (Signed)
Patient ID: Elizabeth Mcconnell, female   DOB: [birth year], 71 y.o.   MRN: [id number]     Subjective: Still with crampy lower abdominal pain unchanged from admission. No flatus or bowel movement yet. NG is in place and contrast given for small bowel study.  Objective: Vital signs in last 24 hours: Temp:  [98.4 F (36.9 C)] 98.4 F (36.9 C) (09/11 0624) Pulse Rate:  [89-146] 90 (09/11 0624) Resp:  [13-34] 18 (09/11 0624) BP: (133-169)/(55-124) 151/71 (09/11 0624) SpO2:  [89 %-99 %] 94 % (09/11 0624) Last BM Date: 11/12/16  Intake/Output from previous day: 09/10 0701 - 09/11 0700 In: 30 [NG/GT:30] Out: 50 [Emesis/NG output:50] Intake/Output this shift: No intake/output data recorded.  General appearance: alert, cooperative and no distress GI: mild distention and tympany. Moderate diffuse lower abdominal tenderness.  Lab Results:   Recent Labs  11/11/16 2255 11/12/16 0711  WBC 11.2* 7.4  HGB 14.3 12.3  HCT 44.1 39.1  PLT 200 178   BMET  Recent Labs  11/11/16 2255 11/12/16 0711  NA 137 140  K 4.0 3.8  CL 105 110  CO2 21* 23  GLUCOSE 103* 99  BUN 12 10  CREATININE 0.72 0.68  CALCIUM 9.4 8.4*     Studies/Results: Ct Abdomen Pelvis W Contrast  Result Date: 11/12/2016 CLINICAL DATA:  Lower abdominal pain beginning this evening. History of small bowel obstruction from adhesions, partial colectomy for diverticulitis and hysterectomy. EXAM: CT ABDOMEN AND PELVIS WITH CONTRAST TECHNIQUE: Multidetector CT imaging of the abdomen and pelvis was performed using the standard protocol following bolus administration of intravenous contrast. CONTRAST:  113mL ISOVUE-300 IOPAMIDOL (ISOVUE-300) INJECTION 61% COMPARISON:  Acute abdominal series May 11, 2016 and CT abdomen and pelvis May 09, 2016 FINDINGS: LOWER CHEST: Lung bases are clear. Included heart size is normal. No pericardial effusion. HEPATOBILIARY: Liver and gallbladder are normal. PANCREAS: Normal. SPLEEN: Normal.  ADRENALS/URINARY TRACT: Kidneys are orthotopic, demonstrating symmetric enhancement. No nephrolithiasis, hydronephrosis or solid renal masses. The unopacified ureters are normal in course and caliber. Delayed imaging through the kidneys demonstrates symmetric prompt contrast excretion within the proximal urinary collecting system. Urinary bladder is partially distended and unremarkable. Normal adrenal glands. STOMACH/BOWEL: Mildly dilated small bowel to 3 cm with small bowel feces. Small bowel transition point in central pelvis. Mild associated small bowel wall thickening and inflammation. Sigmoid bowel anastomosis. Mild colonic diverticulosis. The appendix is not discretely identified, however there are no inflammatory changes in the right lower quadrant. Small hiatal hernia. Small duodenal diverticulum within third portion with debris. Normal appendix. VASCULAR/LYMPHATIC: Aortoiliac vessels are normal in course and caliber. Moderate calcific atherosclerosis. No lymphadenopathy by CT size criteria. REPRODUCTIVE: Status post hysterectomy.  Small Bartholin cyst. OTHER: No intraperitoneal free fluid or free air. MUSCULOSKELETAL: Small fat containing inguinal hernias. Focal fat stranding in RIGHT inguinal soft tissues. Grade 1 L4-5 anterolisthesis without spondylolysis. Severe canal stenosis L4-5. Osteopenia. Severe lower lumbar facet arthropathy. IMPRESSION: 1. Recurrent early versus partial small bowel obstruction, transition point in the pelvis consistent with the adhesion. 2. RIGHT inguinal subcutaneous fat stranding seen with cellulitis for contusion. Recommend direct inspection. 3. Grade 1 L4-5 anterolisthesis on degenerative basis. Severe L4-5 canal stenosis . Aortic Atherosclerosis (ICD10-I70.0). Electronically Signed   By: Elon Alas M.D.   On: 11/12/2016 00:48   Dg Abd Portable 1v-small Bowel Protocol-position Verification  Result Date: 11/12/2016 CLINICAL DATA:  Nasogastric tube placement. EXAM:  PORTABLE ABDOMEN - 1 VIEW COMPARISON:  CT abdomen and pelvis November 12, 2016 FINDINGS:  Nasogastric tube tip projects in mid stomach, side port past the GE junction. Contrast in the urinary collecting system. Nonspecific bowel gas pattern. Soft tissue planes and included osseous structures are unchanged. IMPRESSION: Nasogastric tube tip projects in mid stomach. Electronically Signed   By: Elon Alas M.D.   On: 11/12/2016 05:16    Anti-infectives: Anti-infectives    None      Assessment/Plan: Small bowel obstruction likely secondary to adhesions. White blood count normalized. She has a moderately tender abdomen but no evidence of peritonitis. Starting small bowel protocol. Follow x-rays and clinical course closely.    LOS: 0 days    Elizabeth Mcconnell T 11/12/2016

## 2016-11-12 NOTE — Progress Notes (Signed)
[place] for heparin Indication: atrial fibrillation  Allergies  Allergen Reactions  . Tramadol Other (See Comments)    Reaction:  Dizziness and weakness   . Amoxicillin-Pot Clavulanate Nausea And Vomiting and Other (See Comments)    Has patient had a PCN reaction causing immediate rash, facial/tongue/throat swelling, SOB or lightheadedness with hypotension: No Has patient had a PCN reaction causing severe rash involving mucus membranes or skin necrosis: No Has patient had a PCN reaction that required hospitalization No Has patient had a PCN reaction occurring within the last 10 years: No If all of the above answers are "NO", then may proceed with Cephalosporin use.  . Flagyl [Metronidazole] Nausea And Vomiting  . Flecainide Other (See Comments)    Dizziness   . Omnicef [Cefdinir] Nausea And Vomiting  . Avelox [Moxifloxacin] Hives and Itching    Patient Measurements:   Heparin Dosing Weight: 68.5 kg  Vital Signs: Temp: 98.4 F (36.9 C) (09/11 0624) Temp Source: Oral (09/11 0624) BP: 151/71 (09/11 0624) Pulse Rate: 90 (09/11 0624)  Labs:  Recent Labs  11/11/16 2255 11/12/16 0711  HGB 14.3 12.3  HCT 44.1 39.1  PLT 200 178  CREATININE 0.72 0.68    Estimated Creatinine Clearance: 60.9 mL/min (by C-G formula based on SCr of 0.68 mg/dL).   Medical History: Past Medical History:  Diagnosis Date  . Adenomatous polyp of colon 10/2013   f/u colo 10/2018  . Allergic rhinitis   . ALLERGIC RHINITIS   . ANXIETY    "situational" (02/14/2015)  . Arthritis    "neck, back, hands" (02/14/2015)  . Asthma   . Atrial fibrillation with RVR ([place])   . Atrial flutter ([place])    failed cardioversion 11/2012; s/p ablation 02-02-2013 by Dr Rayann Heman  . Chronic bronchitis ([place])    "I'll have it most years" (02/14/2015)  . Diverticulosis   . Endometriosis   . Essential hypertension 05/30/2014  . Hepatic steatosis   . Oral candidiasis   . Osteopenia  08/2015   T score -1.7 FRAX 14%/4.4% overall stable from prior DEXA.  [name] Pneumothorax on left 1996   left lower lung collapse s/p resection  . Small bowel obstruction ([place])   . Tobacco abuse      Assessment: 71 yo female with AFib, on Eliquis pta, last dose yesterday evening. To transition to heparin gtt while pt is NPO CBC stable.  Goal of Therapy:  Heparin level 0.3-0.7 units/ml aPTT 66-102 seconds Monitor platelets by anticoagulation protocol: Yes    Plan: -Heparin bolus 4000 units x1 then 1000 units/hr -Daily HL, CBC, aptt -aptt this evening    Harvel Quale 11/12/2016,1:11 PM

## 2016-11-12 NOTE — Progress Notes (Signed)
Patient seen and examined. NG clamped, she is waiting for gastrografin study is planned at around 4pm. Patient has h/o afib s/p recent ablation, Case discussed with EP cardiology Dr Rayann Heman who recommended start heparin drip while patient is npo for stroke prevention.  EP and general surgery consulted, input appreciated. Husband updated at bedside.

## 2016-11-12 NOTE — H&P (Signed)
History and Physical    Elizabeth Mcconnell HBZ:[id number] DOB: 31-Dec-1945 DOA: 11/11/2016  Referring MD/NP/PA: Dr. Dina Rich PCP: Janith Lima, MD  Patient coming from: Home  Chief Complaint: Lower abdominal pain  HPI: Elizabeth Mcconnell is a 71 y.o. 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 She described it as a mild crampy like pain of her lower abdomen initially. She tried eating a small meal and afterwards had taken her nightly medications. Over the next few hours patient not abdominal pain progressively worsened and she had an onset of nausea and vomiting. Patient notes that she hasn't had similar symptoms like this previously in March of this year where she had a small bowel obstruction, but it was able to be resolved conservative measures including nasogastric tube placement and bowel rest. Associated symptoms include palpitations. She notes that she just recently underwent ablation with Dr. Rayann Heman of cardiology 1 week ago.   ED Course: Upon admission into the emergency department patient was noted to be afebrile with heart rates 91-146, respirations 14- 34, blood pressure is maintained, and O2 saturations relatively maintained on room air. Labs revealed WBC 11.2. CT scan of the abdomen showed signs of early/partial small bowel obstruction with transition point around previous surgery site. Patient was  made NPO, given antiemetics, and a nasogastric tube was ordered to be placed. General surgery was consulted.   Review of Systems  Constitutional: Negative for chills and fever.  HENT: Negative for ear discharge and ear pain.   Eyes: Negative for double vision and photophobia.  Respiratory: Negative for cough, sputum production and shortness of breath.     Cardiovascular: Positive for palpitations. Negative for chest pain and leg swelling.  Gastrointestinal: Positive for abdominal pain, nausea and vomiting.  Genitourinary: Negative for dysuria and urgency.  Musculoskeletal: Positive for back pain.  Skin: Negative for itching and rash.  Neurological: Negative for focal weakness and seizures.  Psychiatric/Behavioral: Negative for hallucinations and substance abuse.    Past Medical History:  Diagnosis Date  . Adenomatous polyp of colon 10/2013   f/u colo 10/2018  . Allergic rhinitis   . ALLERGIC RHINITIS   . ANXIETY    "situational" (02/14/2015)  . Arthritis    "neck, back, hands" (02/14/2015)  . Asthma   . Atrial fibrillation with RVR ([place])   . Atrial flutter ([place])    failed cardioversion 11/2012; s/p ablation 02-02-2013 by Dr Rayann Heman  . Chronic bronchitis ([place])    "I'll have it most years" (02/14/2015)  . Diverticulosis   . Endometriosis   . Essential hypertension 05/30/2014  . Hepatic steatosis   . Oral candidiasis   . Osteopenia 08/2015   T score -1.7 FRAX 14%/4.4% overall stable from prior DEXA.  [name] Pneumothorax on left 1996   left lower lung collapse s/p resection  . Small bowel obstruction ([place])   . Tobacco abuse     Past Surgical History:  Procedure Laterality Date  . ABDOMINAL HYSTERECTOMY    . APPENDECTOMY  1978  . ATRIAL FIBRILLATION ABLATION N/A 11/05/2016   Procedure: Atrial Fibrillation Ablation;  Surgeon: Thompson Grayer, MD;  Location: [facility];  Service: Cardiovascular;  Laterality: N/A;  . ATRIAL FLUTTER ABLATION N/A 02/02/2013   Procedure: ATRIAL FLUTTER ABLATION;  Surgeon: Coralyn Mark, MD;  Location: [facility];  Service:  Cardiovascular;  Laterality: N/A;  . CARDIOVERSION N/A 11/27/2012   Procedure: CARDIOVERSION;  Surgeon: Thayer Headings, MD;  Location: [facility];  Service: Cardiovascular;  Laterality: N/A;  . [place]; 1978  . LAPAROSCOPIC PARTIAL COLECTOMY N/A 05/04/2015    Procedure: LAPAROSCOPIC LOW ANTERIOR RESECTION LAPAROSCOPIC LYSIS OF ADHESIONS, SPLENIC FLEXURE MOBILIZATION;  Surgeon: Michael Boston, MD;  Location: [facility];  Service: General;  Laterality: N/A;  . LUNG REMOVAL, PARTIAL Left 02/1965   LLL  . TOTAL ABDOMINAL HYSTERECTOMY W/ BILATERAL SALPINGOOPHORECTOMY  1984   Endometriosis     reports that she has been smoking Cigarettes.  She has a 10.75 pack-year smoking history. She has never used smokeless tobacco. She reports that she drinks about 8.4 oz of alcohol per week . She reports that she does not use drugs.  Allergies  Allergen Reactions  . Tramadol Other (See Comments)    Reaction:  Dizziness and weakness   . Amoxicillin-Pot Clavulanate Nausea And Vomiting and Other (See Comments)    Has patient had a PCN reaction causing immediate rash, facial/tongue/throat swelling, SOB or lightheadedness with hypotension: No Has patient had a PCN reaction causing severe rash involving mucus membranes or skin necrosis: No Has patient had a PCN reaction that required hospitalization No Has patient had a PCN reaction occurring within the last 10 years: No If all of the above answers are "NO", then may proceed with Cephalosporin use.  . Flagyl [Metronidazole] Nausea And Vomiting  . Flecainide Other (See Comments)    Dizziness   . Omnicef [Cefdinir] Nausea And Vomiting  . Avelox [Moxifloxacin] Hives and Itching    Family History  Problem Relation Age of Onset  . Asthma Maternal Grandfather   . Stroke Father 11  . Hypertension Father   . Heart disease Father   . Diabetes Mother   . Asthma Child   . Colon cancer Neg Hx   . Rectal cancer Neg Hx   . Stomach cancer Neg Hx     Prior to Admission medications   Medication Sig Start Date End Date Taking? Authorizing Provider  acetaminophen (TYLENOL) 650 MG CR tablet Take 1,300 mg by mouth 2 (two) times daily as needed for pain.    Yes [provider]  ALPRAZolam (XANAX) 0.25 MG tablet Take 0.25  mg by mouth at bedtime as needed for sleep.    Yes [provider]  Calcium Carb-Cholecalciferol (CALCIUM 600+D) 600-800 MG-UNIT TABS Take 1 tablet by mouth daily.   Yes [provider]  cyanocobalamin 2000 MCG tablet Take 1 tablet (2,000 mcg total) by mouth daily. 06/05/15  Yes Janith Lima, MD  dofetilide (TIKOSYN) 250 MCG capsule Take 1 capsule (250 mcg total) by mouth 2 (two) times daily. 02/17/15  Yes Seiler, Amber K, NP  ELIQUIS 5 MG TABS tablet TAKE 1 TABLET BY MOUTH 2 TIMES DAILY 07/17/16  Yes Allred, Jeneen Rinks, MD  estradiol (VIVELLE-DOT) 0.05 MG/24HR patch Place 1 patch (0.05 mg total) onto the skin 2 (two) times a week. 08/08/16  Yes Fontaine, Belinda Block, MD  fluticasone (FLONASE) 50 MCG/ACT nasal spray USE 2 SPRAYS IN EACH NOSTRIL 2 TIMES DAILY 09/30/16  Yes Javier Glazier, MD  fluticasone (FLOVENT HFA) 110 MCG/ACT inhaler Inhale 2 puffs into the lungs 2 (two) times daily. 11/28/15  Yes Janith Lima, MD  levalbuterol (XOPENEX HFA) 45 MCG/ACT inhaler inhale 1 TO 2 PUFFS BY MOUTH EVERY 4 HOURS AS NEEDED Patient taking differently: inhale 1 TO 2 PUFFS BY  MOUTH EVERY 4 HOURS AS NEEDED FOR SHORTNESS OF BREATH 07/17/16  Yes Javier Glazier, MD  magnesium oxide (MAG-OX) 400 MG tablet Take 1 tablet (400 mg total) by mouth daily. 02/13/15  Yes Sherren Mocha, MD  montelukast (SINGULAIR) 10 MG tablet Take 1 tablet (10 mg total) by mouth daily. 03/14/16  Yes Javier Glazier, MD  pantoprazole (PROTONIX) 40 MG tablet Take 1 tablet (40 mg total) by mouth daily. 11/05/16 12/20/16 Yes Allred, Jeneen Rinks, MD  diltiazem (CARDIZEM CD) 300 MG 24 hr capsule TAKE 1 CAPSULE BY MOUTH NIGHTLY AT BEDTIME Patient not taking: Reported on 11/12/2016 04/10/16   Sherren Mocha, MD    Physical Exam:  Constitutional: Elderly female in mild discomfort Vitals:   11/12/16 0100 11/12/16 0115 11/12/16 0130 11/12/16 0145  BP: (!) 169/97 (!) 155/97 (!) 167/96 (!) 161/94  Pulse: 99 94 94 97  Resp: (!) 28 17  20 18   Temp:      SpO2: 97% 98% 95% 92%   Eyes: PERRL, lids and conjunctivae normal ENMT: Mucous membranes are dry. Posterior pharynx clear of any exudate or lesions.   Neck: normal, supple, no masses, no thyromegaly Respiratory: clear to auscultation bilaterally, no wheezing, no crackles. Normal respiratory effort. No accessory muscle use.  Cardiovascular: Regular rate and rhythm, no murmurs / rubs / gallops. No extremity edema. 2+ pedal pulses. No carotid bruits.  Abdomen: Mild lower abdominal tenderness, no masses palpated. No hepatosplenomegaly. Bowel sounds decreased.  Musculoskeletal: no clubbing / cyanosis. No joint deformity upper and lower extremities. Good ROM, no contractures. Normal muscle tone.  Skin: no rashes, lesions, ulcers. No induration Neurologic: CN 2-12 grossly intact. Sensation intact, DTR normal. Strength 5/5 in all 4.  Psychiatric: Normal judgment and insight. Alert and oriented x 3. Normal mood.     Labs on Admission: I have personally reviewed following labs and imaging studies  CBC:  Recent Labs Lab 11/11/16 2255  WBC 11.2*  HGB 14.3  HCT 44.1  MCV 87.5  PLT 709   Basic Metabolic Panel:  Recent Labs Lab 11/11/16 2255  NA 137  K 4.0  CL 105  CO2 21*  GLUCOSE 103*  BUN 12  CREATININE 0.72  CALCIUM 9.4   GFR: Estimated Creatinine Clearance: 60.9 mL/min (by C-G formula based on SCr of 0.72 mg/dL). Liver Function Tests:  Recent Labs Lab 11/11/16 2255  AST 18  ALT 16  ALKPHOS 70  BILITOT 0.7  PROT 7.1  ALBUMIN 4.2   No results for input(s): LIPASE, AMYLASE in the last 168 hours. No results for input(s): AMMONIA in the last 168 hours. Coagulation Profile: No results for input(s): INR, PROTIME in the last 168 hours. Cardiac Enzymes: No results for input(s): CKTOTAL, CKMB, CKMBINDEX, TROPONINI in the last 168 hours. BNP (last 3 results) No results for input(s): PROBNP in the last 8760 hours. HbA1C: No results for input(s): HGBA1C  in the last 72 hours. CBG: No results for input(s): GLUCAP in the last 168 hours. Lipid Profile: No results for input(s): CHOL, HDL, LDLCALC, TRIG, CHOLHDL, LDLDIRECT in the last 72 hours. Thyroid Function Tests: No results for input(s): TSH, T4TOTAL, FREET4, T3FREE, THYROIDAB in the last 72 hours. Anemia Panel: No results for input(s): VITAMINB12, FOLATE, FERRITIN, TIBC, IRON, RETICCTPCT in the last 72 hours. Urine analysis:    Component Value Date/Time   COLORURINE AMBER (A) 05/09/2016 1941   APPEARANCEUR HAZY (A) 05/09/2016 1941   LABSPEC 1.025 05/09/2016 1941   PHURINE 7.0 05/09/2016 1941   GLUCOSEU  50 (A) 05/09/2016 1941   GLUCOSEU NEGATIVE 10/21/2014 1019   HGBUR NEGATIVE 05/09/2016 1941   BILIRUBINUR SMALL (A) 05/09/2016 1941   KETONESUR 20 (A) 05/09/2016 1941   PROTEINUR 100 (A) 05/09/2016 1941   UROBILINOGEN 1.0 10/21/2014 1019   NITRITE NEGATIVE 05/09/2016 1941   LEUKOCYTESUR SMALL (A) 05/09/2016 1941   Sepsis Labs: No results found for this or any previous visit (from the past 240 hour(s)).   Radiological Exams on Admission: Ct Abdomen Pelvis W Contrast  Result Date: 11/12/2016 CLINICAL DATA:  Lower abdominal pain beginning this evening. History of small bowel obstruction from adhesions, partial colectomy for diverticulitis and hysterectomy. EXAM: CT ABDOMEN AND PELVIS WITH CONTRAST TECHNIQUE: Multidetector CT imaging of the abdomen and pelvis was performed using the standard protocol following bolus administration of intravenous contrast. CONTRAST:  189mL ISOVUE-300 IOPAMIDOL (ISOVUE-300) INJECTION 61% COMPARISON:  Acute abdominal series May 11, 2016 and CT abdomen and pelvis May 09, 2016 FINDINGS: LOWER CHEST: Lung bases are clear. Included heart size is normal. No pericardial effusion. HEPATOBILIARY: Liver and gallbladder are normal. PANCREAS: Normal. SPLEEN: Normal. ADRENALS/URINARY TRACT: Kidneys are orthotopic, demonstrating symmetric enhancement. No  nephrolithiasis, hydronephrosis or solid renal masses. The unopacified ureters are normal in course and caliber. Delayed imaging through the kidneys demonstrates symmetric prompt contrast excretion within the proximal urinary collecting system. Urinary bladder is partially distended and unremarkable. Normal adrenal glands. STOMACH/BOWEL: Mildly dilated small bowel to 3 cm with small bowel feces. Small bowel transition point in central pelvis. Mild associated small bowel wall thickening and inflammation. Sigmoid bowel anastomosis. Mild colonic diverticulosis. The appendix is not discretely identified, however there are no inflammatory changes in the right lower quadrant. Small hiatal hernia. Small duodenal diverticulum within third portion with debris. Normal appendix. VASCULAR/LYMPHATIC: Aortoiliac vessels are normal in course and caliber. Moderate calcific atherosclerosis. No lymphadenopathy by CT size criteria. REPRODUCTIVE: Status post hysterectomy.  Small Bartholin cyst. OTHER: No intraperitoneal free fluid or free air. MUSCULOSKELETAL: Small fat containing inguinal hernias. Focal fat stranding in RIGHT inguinal soft tissues. Grade 1 L4-5 anterolisthesis without spondylolysis. Severe canal stenosis L4-5. Osteopenia. Severe lower lumbar facet arthropathy. IMPRESSION: 1. Recurrent early versus partial small bowel obstruction, transition point in the pelvis consistent with the adhesion. 2. RIGHT inguinal subcutaneous fat stranding seen with cellulitis for contusion. Recommend direct inspection. 3. Grade 1 L4-5 anterolisthesis on degenerative basis. Severe L4-5 canal stenosis . Aortic Atherosclerosis (ICD10-I70.0). Electronically Signed   By: Elon Alas M.D.   On: 11/12/2016 00:48    EKG: Independently reviewed. Sinus rhythm at 97 bpm  Assessment/Plan Partial/ early small bowel obstruction: Acute. Patient presents with nausea, vomiting, lower abdominal pain. CT imaging shows early/partial small bowel  obstruction. Risk factor includes history of bowel resection. - Admit to a telemetry bed - NPO - NGT to suction - Antiemetics as needed - fentanyl prn pain - Gen. surgery consulted, will follow-up for further recommendations  Paroxysmal atrial fibrillation: Chadsvasc score =3 Patient just recently had ablation by Dr. Rayann Heman of cardiology 1 week ago. - message sent to Gay Filler for cardiology to see the patient  - Holding Eliquis for possible need of surgical procedure - Will consider need of Cardizem gtt to control heart rates if needed  DVT prophylaxis: SCD  Code Status: Full Family Communication: no family present at bedside Disposition Plan: Likely discharge home if able to Consults called:  Surgery Admission status: Inpatient   Norval Morton MD [facility]  Pager [phone]   If 7PM-7AM, please  contact night-coverage [email or web address] Password [email or web address]  11/12/2016, 2:20 AM

## 2016-11-12 NOTE — ED Notes (Signed)
Patient transported to CT scan . 

## 2016-11-12 NOTE — Consult Note (Signed)
Reason for Consult: vomiting Referring Physician: Thayer Jew  Elizabeth Mcconnell is an 71 y.o. female.  HPI: 71 yo female presents with 1 day of nausea, lower abdominal pain and vomiting. She was normal in healthy until this morning when she had pain and nausea. She came to the ER because the pain continued. It was similar to her previous bowel obstruction in 05/2016 which resolved by NG decompression. Since being in the hospital she has had multiple episodes of vomiting with some relief. She continues to have right lower abdominal pain.  Past Medical History:  Diagnosis Date  . Adenomatous polyp of colon 10/2013   f/u colo 10/2018  . Allergic rhinitis   . ALLERGIC RHINITIS   . ANXIETY    "situational" (02/14/2015)  . Arthritis    "neck, back, hands" (02/14/2015)  . Asthma   . Atrial fibrillation with RVR ([place])   . Atrial flutter ([place])    failed cardioversion 11/2012; s/p ablation 02-02-2013 by Dr Rayann Heman  . Chronic bronchitis ([place])    "I'll have it most years" (02/14/2015)  . Diverticulosis   . Endometriosis   . Essential hypertension 05/30/2014  . Hepatic steatosis   . Oral candidiasis   . Osteopenia 08/2015   T score -1.7 FRAX 14%/4.4% overall stable from prior DEXA.  [name] Pneumothorax on left 1996   left lower lung collapse s/p resection  . Small bowel obstruction ([place])   . Tobacco abuse     Past Surgical History:  Procedure Laterality Date  . ABDOMINAL HYSTERECTOMY    . APPENDECTOMY  1978  . ATRIAL FIBRILLATION ABLATION N/A 11/05/2016   Procedure: Atrial Fibrillation Ablation;  Surgeon: Thompson Grayer, MD;  Location: [place] [facility];  Service: Cardiovascular;  Laterality: N/A;  . ATRIAL FLUTTER ABLATION N/A 02/02/2013   Procedure: ATRIAL FLUTTER ABLATION;  Surgeon: Coralyn Mark, MD;  Location: [facility];  Service: Cardiovascular;  Laterality: N/A;  . CARDIOVERSION N/A 11/27/2012   Procedure: CARDIOVERSION;  Surgeon: Thayer Headings, MD;  Location: [facility];   Service: Cardiovascular;  Laterality: N/A;  . [place]; 1978  . LAPAROSCOPIC PARTIAL COLECTOMY N/A 05/04/2015   Procedure: LAPAROSCOPIC LOW ANTERIOR RESECTION LAPAROSCOPIC LYSIS OF ADHESIONS, SPLENIC FLEXURE MOBILIZATION;  Surgeon: Michael Boston, MD;  [facility];  Service: General;  Laterality: N/A;  . LUNG REMOVAL, PARTIAL Left 02/1965   LLL  . TOTAL ABDOMINAL HYSTERECTOMY W/ BILATERAL SALPINGOOPHORECTOMY  1984   Endometriosis    Family History  Problem Relation Age of Onset  . Asthma Maternal Grandfather   . Stroke Father 73  . Hypertension Father   . Heart disease Father   . Diabetes Mother   . Asthma Child   . Colon cancer Neg Hx   . Rectal cancer Neg Hx   . Stomach cancer Neg Hx     Social History:  reports that she has been smoking Cigarettes.  She has a 10.75 pack-year smoking history. She has never used smokeless tobacco. She reports that she drinks about 8.4 oz of alcohol per week . She reports that she does not use drugs.  Allergies:  Allergies  Allergen Reactions  . Tramadol Other (See Comments)    Reaction:  Dizziness and weakness   . Amoxicillin-Pot Clavulanate Nausea And Vomiting and Other (See Comments)    Has patient had a PCN reaction causing immediate rash, facial/tongue/throat swelling, SOB or lightheadedness with hypotension: No Has patient had a PCN reaction causing severe rash involving mucus membranes or  skin necrosis: No Has patient had a PCN reaction that required hospitalization No Has patient had a PCN reaction occurring within the last 10 years: No If all of the above answers are "NO", then may proceed with Cephalosporin use.  . Flagyl [Metronidazole] Nausea And Vomiting  . Flecainide Other (See Comments)    Dizziness   . Omnicef [Cefdinir] Nausea And Vomiting  . Avelox [Moxifloxacin] Hives and Itching    Medications: I have reviewed the patient's current medications.  Results for orders placed or performed during the hospital  encounter of 11/11/16 (from the past 48 hour(s))  Comprehensive metabolic panel     Status: Abnormal   Collection Time: 11/11/16 10:55 PM  Result Value Ref Range   Sodium 137 135 - 145 mmol/L   Potassium 4.0 3.5 - 5.1 mmol/L   Chloride 105 101 - 111 mmol/L   CO2 21 (L) 22 - 32 mmol/L   Glucose, Bld 103 (H) 65 - 99 mg/dL   BUN 12 6 - 20 mg/dL   Creatinine, Ser 0.72 0.44 - 1.00 mg/dL   Calcium 9.4 8.9 - 10.3 mg/dL   Total Protein 7.1 6.5 - 8.1 g/dL   Albumin 4.2 3.5 - 5.0 g/dL   AST 18 15 - 41 U/L   ALT 16 14 - 54 U/L   Alkaline Phosphatase 70 38 - 126 U/L   Total Bilirubin 0.7 0.3 - 1.2 mg/dL   GFR calc non Af Amer >60 >60 mL/min   GFR calc Af Amer >60 >60 mL/min    Comment: (NOTE) The eGFR has been calculated using the CKD EPI equation. This calculation has not been validated in all clinical situations. eGFR's persistently <60 mL/min signify possible Chronic Kidney Disease.    Anion gap 11 5 - 15  CBC     Status: Abnormal   Collection Time: 11/11/16 10:55 PM  Result Value Ref Range   WBC 11.2 (H) 4.0 - 10.5 K/uL   RBC 5.04 3.87 - 5.11 MIL/uL   Hemoglobin 14.3 12.0 - 15.0 g/dL   HCT 44.1 36.0 - 46.0 %   MCV 87.5 78.0 - 100.0 fL   MCH 28.4 26.0 - 34.0 pg   MCHC 32.4 30.0 - 36.0 g/dL   RDW 13.6 11.5 - 15.5 %   Platelets 200 150 - 400 K/uL  I-Stat CG4 Lactic Acid, ED     Status: None   Collection Time: 11/11/16 11:10 PM  Result Value Ref Range   Lactic Acid, Venous 1.58 0.5 - 1.9 mmol/L  I-Stat CG4 Lactic Acid, ED     Status: None   Collection Time: 11/12/16  2:06 AM  Result Value Ref Range   Lactic Acid, Venous 0.80 0.5 - 1.9 mmol/L  Urinalysis, Routine w reflex microscopic     Status: Abnormal   Collection Time: 11/12/16  2:08 AM  Result Value Ref Range   Color, Urine YELLOW YELLOW   APPearance CLEAR CLEAR   Specific Gravity, Urine 1.045 (H) 1.005 - 1.030   pH 7.0 5.0 - 8.0   Glucose, UA NEGATIVE NEGATIVE mg/dL   Hgb urine dipstick NEGATIVE NEGATIVE   Bilirubin  Urine NEGATIVE NEGATIVE   Ketones, ur NEGATIVE NEGATIVE mg/dL   Protein, ur NEGATIVE NEGATIVE mg/dL   Nitrite NEGATIVE NEGATIVE   Leukocytes, UA NEGATIVE NEGATIVE    Ct Abdomen Pelvis W Contrast  Result Date: 11/12/2016 CLINICAL DATA:  Lower abdominal pain beginning this evening. History of small bowel obstruction from adhesions, partial colectomy for diverticulitis and hysterectomy.  EXAM: CT ABDOMEN AND PELVIS WITH CONTRAST TECHNIQUE: Multidetector CT imaging of the abdomen and pelvis was performed using the standard protocol following bolus administration of intravenous contrast. CONTRAST:  115m ISOVUE-300 IOPAMIDOL (ISOVUE-300) INJECTION 61% COMPARISON:  Acute abdominal series May 11, 2016 and CT abdomen and pelvis May 09, 2016 FINDINGS: LOWER CHEST: Lung bases are clear. Included heart size is normal. No pericardial effusion. HEPATOBILIARY: Liver and gallbladder are normal. PANCREAS: Normal. SPLEEN: Normal. ADRENALS/URINARY TRACT: Kidneys are orthotopic, demonstrating symmetric enhancement. No nephrolithiasis, hydronephrosis or solid renal masses. The unopacified ureters are normal in course and caliber. Delayed imaging through the kidneys demonstrates symmetric prompt contrast excretion within the proximal urinary collecting system. Urinary bladder is partially distended and unremarkable. Normal adrenal glands. STOMACH/BOWEL: Mildly dilated small bowel to 3 cm with small bowel feces. Small bowel transition point in central pelvis. Mild associated small bowel wall thickening and inflammation. Sigmoid bowel anastomosis. Mild colonic diverticulosis. The appendix is not discretely identified, however there are no inflammatory changes in the right lower quadrant. Small hiatal hernia. Small duodenal diverticulum within third portion with debris. Normal appendix. VASCULAR/LYMPHATIC: Aortoiliac vessels are normal in course and caliber. Moderate calcific atherosclerosis. No lymphadenopathy by CT size  criteria. REPRODUCTIVE: Status post hysterectomy.  Small Bartholin cyst. OTHER: No intraperitoneal free fluid or free air. MUSCULOSKELETAL: Small fat containing inguinal hernias. Focal fat stranding in RIGHT inguinal soft tissues. Grade 1 L4-5 anterolisthesis without spondylolysis. Severe canal stenosis L4-5. Osteopenia. Severe lower lumbar facet arthropathy. IMPRESSION: 1. Recurrent early versus partial small bowel obstruction, transition point in the pelvis consistent with the adhesion. 2. RIGHT inguinal subcutaneous fat stranding seen with cellulitis for contusion. Recommend direct inspection. 3. Grade 1 L4-5 anterolisthesis on degenerative basis. Severe L4-5 canal stenosis . Aortic Atherosclerosis (ICD10-I70.0). Electronically Signed   By: CElon AlasM.D.   On: 11/12/2016 00:48    Review of Systems  Constitutional: Negative for chills and fever.  HENT: Negative for hearing loss.   Eyes: Negative for blurred vision and double vision.  Respiratory: Negative for cough and hemoptysis.   Cardiovascular: Negative for chest pain and palpitations.  Gastrointestinal: Positive for abdominal pain, nausea and vomiting.  Genitourinary: Negative for dysuria and urgency.  Musculoskeletal: Negative for myalgias and neck pain.  Skin: Negative for itching and rash.  Neurological: Negative for dizziness, tingling and headaches.  Endo/Heme/Allergies: Does not bruise/bleed easily.  Psychiatric/Behavioral: Negative for depression and suicidal ideas.   Blood pressure (!) 146/55, pulse 91, temperature 98.4 F (36.9 C), resp. rate 15, SpO2 90 %. Physical Exam  Vitals reviewed. Constitutional: She is oriented to person, place, and time. She appears well-developed and well-nourished.  HENT:  Head: Normocephalic and atraumatic.  Eyes: Pupils are equal, round, and reactive to light. Conjunctivae and EOM are normal.  Neck: Normal range of motion. Neck supple.  Cardiovascular: Normal rate and regular rhythm.    Respiratory: Effort normal and breath sounds normal.  GI: Soft. Bowel sounds are normal. She exhibits distension. There is tenderness in the right lower quadrant and suprapubic area.  Musculoskeletal: Normal range of motion.  Neurological: She is alert and oriented to person, place, and time.  Skin: Skin is warm and dry. Ecchymosis noted.  Ecchymosis over right groin  Psychiatric: She has a normal mood and affect. Her behavior is normal.    Assessment/Plan: 71yo female with history of colon resection and hysterectomy with signs of a bowel obstruction -NG tube -SB protocol -will continue to follow along  LArta BruceKinsinger 11/12/2016,  3:53 AM

## 2016-11-12 NOTE — Consult Note (Signed)
Cardiology Consultation:   Patient ID: Elizabeth Mcconnell; [id number]; Jan 11, 1946   Admit date: 11/11/2016 Date of Consult: 11/12/2016  Primary Care Provider: Janith Lima, MD Primary Cardiologist: Dr. Burt Knack Primary Electrophysiologist:  Dr. Rayann Heman   Patient Profile:   Elizabeth Mcconnell is a 71 y.o. female with a hx of Paroxysmal Afib s/p EPS/ablation last week, AFlutter (ablated remotely), HTN who is being seen today for the evaluation of AFib at the request of Dr. Erlinda Hong.  History of Present Illness:   Elizabeth Mcconnell was admitted to [facility] last night with acute abdominal pain, nausea, vomiting, found with SBO.  SHe is being followed by medicine and surgical service, not as of now it appears planned/needing surgical intervention s/p NGT, WBC down this AM.    LABS K+ 3.8 BUN/Creat 10/0.68 LFTs wnl WBC 11.2 >> 7.4 H/H 14.3/44.1 >> 12.3/39.1 plts 178  She has known PAFib, underwent EPS/ablation last week, out patient on Eliquis/Tikosyn, all PO has been held.  She is concerned about her tikosyn and a/c.  Abd pain is better post NGT, has crampy pain still, no BM or flatus.  Past Medical History:  Diagnosis Date  . Adenomatous polyp of colon 10/2013   f/u colo 10/2018  . Allergic rhinitis   . ALLERGIC RHINITIS   . ANXIETY    "situational" (02/14/2015)  . Arthritis    "neck, back, hands" (02/14/2015)  . Asthma   . Atrial fibrillation with RVR ([place])   . Atrial flutter ([place])    failed cardioversion 11/2012; s/p ablation 02-02-2013 by Dr Rayann Heman  . Chronic bronchitis ([place])    "I'll have it most years" (02/14/2015)  . Diverticulosis   . Endometriosis   . Essential hypertension 05/30/2014  . Hepatic steatosis   . Oral candidiasis   . Osteopenia 08/2015   T score -1.7 FRAX 14%/4.4% overall stable from prior DEXA.  [name] Pneumothorax on left 1996   left lower lung collapse s/p resection  . Small bowel obstruction ([place])   . Tobacco abuse     Past Surgical History:  Procedure  Laterality Date  . ABDOMINAL HYSTERECTOMY    . APPENDECTOMY  1978  . ATRIAL FIBRILLATION ABLATION N/A 11/05/2016   Procedure: Atrial Fibrillation Ablation;  Surgeon: Thompson Grayer, MD;  Location: [facility];  Service: Cardiovascular;  Laterality: N/A;  . ATRIAL FLUTTER ABLATION N/A 02/02/2013   Procedure: ATRIAL FLUTTER ABLATION;  Surgeon: Coralyn Mark, MD;  Location: [facility];  Service: Cardiovascular;  Laterality: N/A;  . CARDIOVERSION N/A 11/27/2012   Procedure: CARDIOVERSION;  Surgeon: Thayer Headings, MD;  Location: [facility];  Service: Cardiovascular;  Laterality: N/A;  . [place]; 1978  . LAPAROSCOPIC PARTIAL COLECTOMY N/A 05/04/2015   Procedure: LAPAROSCOPIC LOW ANTERIOR RESECTION LAPAROSCOPIC LYSIS OF ADHESIONS, SPLENIC FLEXURE MOBILIZATION;  Surgeon: Michael Boston, MD;  [facility];  Service: General;  Laterality: N/A;  . LUNG REMOVAL, PARTIAL Left 02/1965   LLL  . TOTAL ABDOMINAL HYSTERECTOMY W/ BILATERAL SALPINGOOPHORECTOMY  1984   Endometriosis       Inpatient Medications: Scheduled Meds:  Continuous Infusions: . sodium chloride     PRN Meds: albuterol, fentaNYL (SUBLIMAZE) injection, ondansetron **OR** ondansetron (ZOFRAN) IV  Allergies:    Allergies  Allergen Reactions  . Tramadol Other (See Comments)    Reaction:  Dizziness and weakness   . Amoxicillin-Pot Clavulanate Nausea And Vomiting and Other (See Comments)    Has patient had a PCN reaction causing immediate rash, facial/tongue/throat  swelling, SOB or lightheadedness with hypotension: No Has patient had a PCN reaction causing severe rash involving mucus membranes or skin necrosis: No Has patient had a PCN reaction that required hospitalization No Has patient had a PCN reaction occurring within the last 10 years: No If all of the above answers are "NO", then may proceed with Cephalosporin use.  . Flagyl [Metronidazole] Nausea And Vomiting  . Flecainide Other (See Comments)     Dizziness   . Omnicef [Cefdinir] Nausea And Vomiting  . Avelox [Moxifloxacin] Hives and Itching    Social History:   Social History   Social History  . Marital status: Married    Spouse name: N/A  . Number of children: 2  . Years of education: N/A   Occupational History  . 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  [employer]   Social History Main Topics  . Smoking status: Current Some Day Smoker    Packs/day: 0.25    Years: 43.00    Types: Cigarettes  . Smokeless tobacco: Never Used  . Alcohol use 8.4 oz/week    14 Standard drinks or equivalent per week     Comment: daily wine  . Drug use: No  . Sexual activity: Yes    Birth control/ protection: Surgical, Post-menopausal     Comment: HYST-1st intercourse 71 yo-Fewer than 5 partners   Other Topics Concern  . Not on file   Social History Narrative   Lives with spouse;    2 children      [facility]:   She is from [facility]. 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 Previously has attended business school. Previously did office work and Programmer, applications at [employer]. She has also worked for [employer]. She does have a cat currently. She has indoor plants. No mold exposure.    Family History:    Family History  Problem Relation Age of Onset  . Asthma Maternal Grandfather   . Stroke Father 48  . Hypertension Father   . Heart disease Father   . Diabetes Mother   . Asthma Child   . Colon cancer Neg Hx   . Rectal cancer Neg Hx   . Stomach cancer Neg Hx      ROS:  Please see the history of present illness.  ROS  All other ROS reviewed and negative.     Physical Exam/Data:   Vitals:   11/12/16 0430 11/12/16 0445 11/12/16 0500 11/12/16 0624  BP: 136/71 (!) 166/96 (!) 155/71 (!) 151/71  Pulse: 93 95 91 90  Resp: 17 (!) 25 (!) 25 18  Temp:    98.4 F (36.9 C)  TempSrc:    Oral  SpO2: 90% 99% 94% 94%    Intake/Output Summary (Last 24 hours) at 11/12/16 1104 Last data filed  at 11/12/16 0647  Gross per 24 hour  Intake               30 ml  Output               50 ml  Net              -20 ml   There were no vitals filed for this visit. There is no height or weight on file to calculate BMI.  General:  Well nourished, well developed, in no acute distress HEENT: normal Lymph: no adenopathy Neck: no JVD Endocrine:  No thryomegaly Vascular: No carotid bruits  Cardiac:  RRR; no murmurs, gallops or rubs Lungs:  CTA b/l, no wheezing, rhonchi or rales  Abd: deferred, just examined by surgeon with tenderness Ext: no edema Musculoskeletal:  No deformities, BUE and BLE strength normal and equal Skin: warm and dry  Neuro: no focal abnormalities noted Psych:  Normal affect   EKG:  The EKG was personally reviewed and demonstrates:   #1 SF 148bpm #2 SR 97bpm, PR 166ms, QRS 182ms, RsR V1 Telemetry:  Telemetry was personally reviewed and demonstrates:  SR since early this AM, she had a few brief episodes of AF w/RVR early AM/overnight  Relevant CV Studies:  11/05/16, EPS/ablation, Dr. Rayann Heman CONCLUSIONS: 1. Atrial fibrillation upon presentation.   2. Prodigious conduction within all four PVs at baseline.  The LSPV was the culprit for afib today 3. Successful electrical isolation and anatomical encircling of all four pulmonary veins with radiofrequency current. 4. No inducible arrhythmias following ablation both on and off of Isuprel 5. Complete bidirectional CTI confirmed from a prior ablation procedure 6. No early apparent complications.  Laboratory Data:  Chemistry Recent Labs Lab 11/11/16 2255 11/12/16 0711  NA 137 140  K 4.0 3.8  CL 105 110  CO2 21* 23  GLUCOSE 103* 99  BUN 12 10  CREATININE 0.72 0.68  CALCIUM 9.4 8.4*  GFRNONAA >60 >60  GFRAA >60 >60  ANIONGAP 11 7     Recent Labs Lab 11/11/16 2255  PROT 7.1  ALBUMIN 4.2  AST 18  ALT 16  ALKPHOS 70  BILITOT 0.7   Hematology Recent Labs Lab 11/11/16 2255 11/12/16 0711  WBC 11.2*  7.4  RBC 5.04 4.42  HGB 14.3 12.3  HCT 44.1 39.1  MCV 87.5 88.5  MCH 28.4 27.8  MCHC 32.4 31.5  RDW 13.6 13.8  PLT 200 178   Cardiac EnzymesNo results for input(s): TROPONINI in the last 168 hours. No results for input(s): TROPIPOC in the last 168 hours.  BNPNo results for input(s): BNP, PROBNP in the last 168 hours.  DDimer No results for input(s): DDIMER in the last 168 hours.  Radiology/Studies:  Ct Abdomen Pelvis W Contrast Result Date: 11/12/2016 CLINICAL DATA:  Lower abdominal pain beginning this evening. History of small bowel obstruction from adhesions, partial colectomy for diverticulitis and hysterectomy. EXAM: CT ABDOMEN AND PELVIS WITH CONTRAST TECHNIQUE: Multidetector CT imaging of the abdomen and pelvis was performed using the standard protocol following bolus administration of intravenous contrast. CONTRAST:  166mL ISOVUE-300 IOPAMIDOL (ISOVUE-300) INJECTION 61% COMPARISON:  Acute abdominal series May 11, 2016 and CT abdomen and pelvis May 09, 2016 FINDINGS: LOWER CHEST: Lung bases are clear. Included heart size is normal. No pericardial effusion. HEPATOBILIARY: Liver and gallbladder are normal. PANCREAS: Normal. SPLEEN: Normal. ADRENALS/URINARY TRACT: Kidneys are orthotopic, demonstrating symmetric enhancement. No nephrolithiasis, hydronephrosis or solid renal masses. The unopacified ureters are normal in course and caliber. Delayed imaging through the kidneys demonstrates symmetric prompt contrast excretion within the proximal urinary collecting system. Urinary bladder is partially distended and unremarkable. Normal adrenal glands. STOMACH/BOWEL: Mildly dilated small bowel to 3 cm with small bowel feces. Small bowel transition point in central pelvis. Mild associated small bowel wall thickening and inflammation. Sigmoid bowel anastomosis. Mild colonic diverticulosis. The appendix is not discretely identified, however there are no inflammatory changes in the right lower quadrant.  Small hiatal hernia. Small duodenal diverticulum within third portion with debris. Normal appendix. VASCULAR/LYMPHATIC: Aortoiliac vessels are normal in course and caliber. Moderate calcific atherosclerosis. No lymphadenopathy by CT size criteria. REPRODUCTIVE: Status  post hysterectomy.  Small Bartholin cyst. OTHER: No intraperitoneal free fluid or free air. MUSCULOSKELETAL: Small fat containing inguinal hernias. Focal fat stranding in RIGHT inguinal soft tissues. Grade 1 L4-5 anterolisthesis without spondylolysis. Severe canal stenosis L4-5. Osteopenia. Severe lower lumbar facet arthropathy. IMPRESSION: 1. Recurrent early versus partial small bowel obstruction, transition point in the pelvis consistent with the adhesion. 2. RIGHT inguinal subcutaneous fat stranding seen with cellulitis for contusion. Recommend direct inspection. 3. Grade 1 L4-5 anterolisthesis on degenerative basis. Severe L4-5 canal stenosis . Aortic Atherosclerosis (ICD10-I70.0). Electronically Signed   By: Elon Alas M.D.   On: 11/12/2016 00:48    Dg Abd Portable 1v-small Bowel Protocol-position Verification Result Date: 11/12/2016 CLINICAL DATA:  Nasogastric tube placement. EXAM: PORTABLE ABDOMEN - 1 VIEW COMPARISON:  CT abdomen and pelvis November 12, 2016 FINDINGS: Nasogastric tube tip projects in mid stomach, side port past the GE junction. Contrast in the urinary collecting system. Nonspecific bowel gas pattern. Soft tissue planes and included osseous structures are unchanged. IMPRESSION: Nasogastric tube tip projects in mid stomach. Electronically Signed   By: Elon Alas M.D.   On: 11/12/2016 05:16    Assessment and Plan:   1. SBO     Surgery service on case     NGT in place, no BM or flatus yet, suction currently off s/p dose of contrast and pending abd study     2. Paroxysmal Afib     CHA2DS2Vasc is at least 3     She is maintaining SR since overnight hours     If she has more AF she will need heparin gtt      Early weeks post AFib ablation with increased embolic risk, resume her a/c ASAP   Will need to see if we can continue her Tikosyn with NG breaks in suction, currently she is NPO ordered for low/wall intermittent suction  3. HTN     follow         For questions or updates, please contact [facility] Please consult [email or web address] for contact info under Cardiology/STEMI.   Signed, Baldwin Jamaica, PA-C  11/12/2016 11:04 AM  I have seen, examined the patient, and reviewed the above assessment and plan.  Changes to above are made where necessary.  On exam, RRR. Given recent ablation, we really need to resume anticoagulation.  I would advise initiation of heparin drip at this time if unable to take eliquis.  Resume tikosyn when able. EP to follow.  Co Sign: Thompson Grayer, MD 11/12/2016 12:14 PM

## 2016-11-12 NOTE — ED Notes (Signed)
Portable chest x-ray done for post NGT insertion .

## 2016-11-12 NOTE — Progress Notes (Signed)
Started on Heparin drip @ 73ml/hr earlier this shift. No signs of bleeding noted. PTT 198 per lab, Mitzi Hansen from pharmacy made aware.

## 2016-11-12 NOTE — Progress Notes (Signed)
ANTICOAGULATION CONSULT NOTE - Follow Up Consult  Pharmacy Consult for Heparin Indication: atrial fibrillation  Allergies  Allergen Reactions  . Tramadol Other (See Comments)    Reaction:  Dizziness and weakness   . Amoxicillin-Pot Clavulanate Nausea And Vomiting and Other (See Comments)    Has patient had a PCN reaction causing immediate rash, facial/tongue/throat swelling, SOB or lightheadedness with hypotension: No Has patient had a PCN reaction causing severe rash involving mucus membranes or skin necrosis: No Has patient had a PCN reaction that required hospitalization No Has patient had a PCN reaction occurring within the last 10 years: No If all of the above answers are "NO", then may proceed with Cephalosporin use.  . Flagyl [Metronidazole] Nausea And Vomiting  . Flecainide Other (See Comments)    Dizziness   . Omnicef [Cefdinir] Nausea And Vomiting  . Avelox [Moxifloxacin] Hives and Itching    Patient Measurements: IBW: 57 kg Heparin Dosing Weight: 68.5 kg  Vital Signs: Temp: 98.4 F (36.9 C) (09/11 1335) Temp Source: Oral (09/11 1335) BP: 158/74 (09/11 1335) Pulse Rate: 91 (09/11 1335)  Labs:  Recent Labs  11/11/16 2255 11/12/16 0711 11/12/16 1532 11/12/16 1931  HGB 14.3 12.3  --   --   HCT 44.1 39.1  --   --   PLT 200 178  --   --   APTT  --   --  198* 79*  CREATININE 0.72 0.68  --   --     Estimated Creatinine Clearance: 60.9 mL/min (by C-G formula based on SCr of 0.68 mg/dL).   Assessment:  Anticoag: Eliquis/Tikosyn for Afib, here for bowel obstruction, no meds ordered yet but surgery is involved. Last dose Eliquis 9/10 - aPTT 79 in goal. Heparin level pending but anticipate it will be elevated due to drug/lab interaction with Eliquis  Goal of Therapy:  aPTT 66-102 seconds Monitor platelets by anticoagulation protocol: Yes   Plan:  Continue IV heparin at 1000 units/hr APTT and HL and CBC in AM.  Elizabeth Mcconnell S. Alford Highland, PharmD, BCPS Clinical  Staff Pharmacist Pager [phone]  Elizabeth Mcconnell 11/12/2016,8:20 PM

## 2016-11-13 LAB — HEPARIN LEVEL (UNFRACTIONATED): Heparin Unfractionated: 0.73 IU/mL — ABNORMAL HIGH (ref 0.30–0.70)

## 2016-11-13 LAB — BASIC METABOLIC PANEL
Anion gap: 6 (ref 5–15)
BUN: 7 mg/dL (ref 6–20)
CO2: 22 mmol/L (ref 22–32)
Calcium: 8.2 mg/dL — ABNORMAL LOW (ref 8.9–10.3)
Chloride: 111 mmol/L (ref 101–111)
Creatinine, Ser: 0.64 mg/dL (ref 0.44–1.00)
GFR calc Af Amer: >60 mL/min (ref >60)
GFR calc non Af Amer: >60 mL/min (ref >60)
Glucose, Bld: 97 mg/dL (ref 65–99)
Potassium: 3.4 mmol/L — ABNORMAL LOW (ref 3.5–5.1)
Sodium: 139 mmol/L (ref 135–145)

## 2016-11-13 LAB — CBC
HCT: 37.8 % (ref 36.0–46.0)
Hemoglobin: 11.9 g/dL — ABNORMAL LOW (ref 12.0–15.0)
MCH: 28 pg (ref 26.0–34.0)
MCHC: 31.5 g/dL (ref 30.0–36.0)
MCV: 88.9 fL (ref 78.0–100.0)
Platelets: 167 10*3/uL (ref 150–400)
RBC: 4.25 MIL/uL (ref 3.87–5.11)
RDW: 13.7 % (ref 11.5–15.5)
WBC: 9 10*3/uL (ref 4.0–10.5)

## 2016-11-13 LAB — MAGNESIUM: Magnesium: 1.8 mg/dL (ref 1.7–2.4)

## 2016-11-13 LAB — APTT: aPTT: 70 seconds — ABNORMAL HIGH (ref 24–36)

## 2016-11-13 MED ORDER — METOPROLOL TARTRATE 5 MG/5ML IV SOLN
5.0000 mg | Freq: Once | INTRAVENOUS | Status: AC
  Administered 2016-11-13: 5 mg via INTRAVENOUS
  Filled 2016-11-13 (×2): qty 5

## 2016-11-13 MED ORDER — DOFETILIDE 250 MCG PO CAPS
250.0000 ug | ORAL_CAPSULE | Freq: Two times a day (BID) | ORAL | Status: DC
  Administered 2016-11-13 – 2016-11-14 (×3): 250 ug via ORAL
  Filled 2016-11-13 (×3): qty 1

## 2016-11-13 MED ORDER — POTASSIUM CHLORIDE 10 MEQ/100ML IV SOLN
10.0000 meq | INTRAVENOUS | Status: AC
  Administered 2016-11-13 (×4): 10 meq via INTRAVENOUS
  Filled 2016-11-13 (×4): qty 100

## 2016-11-13 MED ORDER — SODIUM CHLORIDE 0.9 % IV SOLN
10.0000 mg | Freq: Two times a day (BID) | INTRAVENOUS | Status: DC
  Administered 2016-11-13 – 2016-11-14 (×3): 10 mg via INTRAVENOUS
  Filled 2016-11-13 (×4): qty 1

## 2016-11-13 MED ORDER — POTASSIUM CHLORIDE CRYS ER 20 MEQ PO TBCR: 40.0000 meq | EXTENDED_RELEASE_TABLET | Freq: Once | ORAL | Status: DC

## 2016-11-13 MED ORDER — APIXABAN 5 MG PO TABS
5.0000 mg | ORAL_TABLET | Freq: Two times a day (BID) | ORAL | Status: DC
  Administered 2016-11-13 – 2016-11-14 (×3): 5 mg via ORAL
  Filled 2016-11-13 (×3): qty 1

## 2016-11-13 MED ORDER — ALPRAZOLAM 0.25 MG PO TABS
0.2500 mg | ORAL_TABLET | Freq: Every evening | ORAL | Status: DC | PRN
  Administered 2016-11-13: 0.25 mg via ORAL
  Filled 2016-11-13: qty 1

## 2016-11-13 NOTE — Progress Notes (Addendum)
[place] for heparin Indication: atrial fibrillation  Allergies  Allergen Reactions  . Tramadol Other (See Comments)    Reaction:  Dizziness and weakness   . Amoxicillin-Pot Clavulanate Nausea And Vomiting and Other (See Comments)    Has patient had a PCN reaction causing immediate rash, facial/tongue/throat swelling, SOB or lightheadedness with hypotension: No Has patient had a PCN reaction causing severe rash involving mucus membranes or skin necrosis: No Has patient had a PCN reaction that required hospitalization No Has patient had a PCN reaction occurring within the last 10 years: No If all of the above answers are "NO", then may proceed with Cephalosporin use.  . Flagyl [Metronidazole] Nausea And Vomiting  . Flecainide Other (See Comments)    Dizziness   . Omnicef [Cefdinir] Nausea And Vomiting  . Avelox [Moxifloxacin] Hives and Itching    Patient Measurements: Height: 5\' 5"  (165.1 cm) Weight: 150 lb (68 kg) IBW/kg (Calculated) : 57 Heparin Dosing Weight: 68.5 kg  Vital Signs: Temp: 98.5 F (36.9 C) (09/12 0646) Temp Source: Oral (09/12 0646) BP: 152/95 (09/12 0646) Pulse Rate: 139 (09/12 0646)  Labs:  Recent Labs  11/11/16 2255 11/12/16 0711 11/12/16 1532 11/12/16 1931 11/13/16 0451  HGB 14.3 12.3  --   --  11.9*  HCT 44.1 39.1  --   --  37.8  PLT 200 178  --   --  167  APTT  --   --  198* 79* 70*  HEPARINUNFRC  --   --   --  1.14* 0.73*  CREATININE 0.72 0.68  --   --  0.64    Estimated Creatinine Clearance: 56.4 mL/min (by C-G formula based on SCr of 0.64 mg/dL).   Medical History: Past Medical History:  Diagnosis Date  . Adenomatous polyp of colon 10/2013   f/u colo 10/2018  . Allergic rhinitis   . ALLERGIC RHINITIS   . ANXIETY    "situational" (02/14/2015)  . Arthritis    "neck, back, hands" (02/14/2015)  . Asthma   . Atrial fibrillation with RVR ([place])   . Atrial flutter ([place])    failed cardioversion  11/2012; s/p ablation 02-02-2013 by Dr Rayann Heman  . Chronic bronchitis ([place])    "I'll have it most years" (02/14/2015)  . Diverticulosis   . Endometriosis   . Essential hypertension 05/30/2014  . Hepatic steatosis   . Oral candidiasis   . Osteopenia 08/2015   T score -1.7 FRAX 14%/4.4% overall stable from prior DEXA.  [name] Pneumothorax on left 1996   left lower lung collapse s/p resection  . Small bowel obstruction ([place])   . Tobacco abuse      Assessment: 71 yo female with AFib, on Eliquis pta, last dose was the evening on 9/10. Pt has been transitioned to heparin infusion while she is NPO. Her aPTT this morning was therapeutic but on the low end of the reference range. Heparin levels are still supratherapeutic as expected due to interaction with (PTA) Eliquis. APTT from yesterday afternoon was drawn while heparin was infusing > this should be disregarded.   Goal of Therapy:  Heparin level 0.3-0.7 units/ml aPTT 66-102 seconds Monitor platelets by anticoagulation protocol: Yes    Plan: -Continue heparin at 1000 units/hr -Daily HL, CBC, aptt    Harvel Quale 11/13/2016,8:06 AM    Addendum Patient now being restarted on Eliquis Discussed plan with RN  Harvel Quale  11/13/2016 9:58 AM

## 2016-11-13 NOTE — Progress Notes (Addendum)
PROGRESS NOTE    Elizabeth Mcconnell  FUX:[id number] DOB: 09/03/1945 DOA: 11/11/2016 PCP: Janith Lima, MD   Brief Narrative: 71 y.o. 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 Patient was found to have partial small bowel obstruction.  Assessment & Plan:   # Partial small bowel obstruction: Clinically improved. Patient had a bowel movement, abdomen exam is normal. Starting diet. Advance as tolerated. Required NG tube in the beginning. General surgery consult appreciated.  #Paroxysmal atrial fibrillation: Discontinued IV heparin and switched to oral eliquis. Resume cardiac medication. Monitor heart rate. Cartilage consult appreciated.  #Hypokalemia repleted potassium chloride. Monitor BMP.  Principal Problem:   Partial small bowel obstruction (HCC) Active Problems:   Paroxysmal atrial fibrillation (HCC)  DVT prophylaxis: SCD Code Status: Full code Family Communication: No family at bedside Disposition Plan: Likely discharge home in 1-2 days    Consultants:   General surgery    cardiology  Procedures: NG tube Antimicrobials: None  Subjective: Denies nausea vomiting chest pain or shortness of breath. No abdominal pain. Had bowel movement yesterday.  Objective: Vitals:   11/13/16 0646 11/13/16 0700 11/13/16 0920 11/13/16 1502  BP: (!) 152/95  140/71 (!) 129/55  Pulse: (!) 139   85  Resp: 18   17  Temp: 98.5 F (36.9 C)   98.3 F (36.8 C)  TempSrc: Oral   Oral  SpO2: 92%     Weight:  68 kg (150 lb)    Height:  5\' 5"  (1.651 m)      Intake/Output Summary (Last 24 hours) at 11/13/16 1518 Last data filed at 11/13/16 1503  Gross per 24 hour  Intake             1771 ml  Output             1660 ml  Net              111 ml   Filed Weights   11/13/16 0700  Weight: 68 kg (150 lb)      Examination:  General exam: Appears calm and comfortable  Respiratory system: Clear to auscultation. Respiratory effort normal. No wheezing or crackle Cardiovascular system: S1 & S2 heard, RRR.  No pedal edema. Gastrointestinal system: Abdomen is nondistended, soft and nontender. Normal bowel sounds heard. Central nervous system: Alert and oriented. No focal neurological deficits. Extremities: Symmetric 5 x 5 power. Skin: No rashes, lesions or ulcers Psychiatry: Judgement and insight appear normal. Mood & affect appropriate.     Data Reviewed: I have personally reviewed following labs and imaging studies  CBC:  Recent Labs Lab 11/11/16 2255 11/12/16 0711 11/13/16 0451  WBC 11.2* 7.4 9.0  HGB 14.3 12.3 11.9*  HCT 44.1 39.1 37.8  MCV 87.5 88.5 88.9  PLT 200 178 025   Basic Metabolic Panel:  Recent Labs Lab 11/11/16 2255 11/12/16 0711 11/13/16 0451  NA 137 140 139  K 4.0 3.8 3.4*  CL 105 110 111  CO2 21* 23 22  GLUCOSE 103* 99 97  BUN 12 10 7   CREATININE 0.72 0.68 0.64  CALCIUM 9.4 8.4* 8.2*  MG  --   --  1.8   GFR: Estimated Creatinine Clearance: 56.4 mL/min (by C-G formula based on SCr of 0.64 mg/dL). Liver Function Tests:  Recent Labs Lab 11/11/16 2255  AST 18  ALT 16  ALKPHOS 70  BILITOT 0.7  PROT 7.1  ALBUMIN 4.2   No results for input(s): LIPASE, AMYLASE in the last 168 hours. No results for input(s): AMMONIA in the last 168 hours. Coagulation Profile: No results for input(s): INR, PROTIME in the last 168 hours. Cardiac Enzymes: No results for input(s): CKTOTAL, CKMB, CKMBINDEX, TROPONINI in the last 168 hours. BNP (last 3 results) No results for input(s): PROBNP in the last 8760 hours. HbA1C: No results for input(s): HGBA1C in the last 72 hours. CBG: No results for input(s): GLUCAP in the last 168 hours. Lipid Profile: No results for input(s): CHOL, HDL, LDLCALC, TRIG, CHOLHDL, LDLDIRECT in the last 72 hours. Thyroid Function  Tests: No results for input(s): TSH, T4TOTAL, FREET4, T3FREE, THYROIDAB in the last 72 hours. Anemia Panel: No results for input(s): VITAMINB12, FOLATE, FERRITIN, TIBC, IRON, RETICCTPCT in the last 72 hours. Sepsis Labs:  Recent Labs Lab 11/11/16 2310 11/12/16 0206  LATICACIDVEN 1.58 0.80    No results found for this or any previous visit (from the past 240 hour(s)).       Radiology Studies: Ct Abdomen Pelvis W Contrast  Result Date: 11/12/2016 CLINICAL DATA:  Lower abdominal pain beginning this evening. History of small bowel obstruction from adhesions, partial colectomy for diverticulitis and hysterectomy. EXAM: CT ABDOMEN AND PELVIS WITH CONTRAST TECHNIQUE: Multidetector CT imaging of the abdomen and pelvis was performed using the standard protocol following bolus administration of intravenous contrast. CONTRAST:  118mL ISOVUE-300 IOPAMIDOL (ISOVUE-300) INJECTION 61% COMPARISON:  Acute abdominal series May 11, 2016 and CT abdomen and pelvis May 09, 2016 FINDINGS: LOWER CHEST: Lung bases are clear. Included heart size is normal. No pericardial effusion. HEPATOBILIARY: Liver and gallbladder are normal. PANCREAS: Normal. SPLEEN: Normal. ADRENALS/URINARY TRACT: Kidneys are orthotopic, demonstrating symmetric enhancement. No nephrolithiasis, hydronephrosis or solid renal masses. The unopacified ureters are normal in course and caliber. Delayed imaging through the kidneys demonstrates symmetric prompt contrast excretion within the proximal urinary collecting system. Urinary bladder is partially distended and unremarkable. Normal adrenal glands. STOMACH/BOWEL: Mildly dilated small bowel to 3 cm with small bowel feces. Small bowel transition point in central pelvis. Mild associated small bowel wall thickening and inflammation. Sigmoid bowel anastomosis. Mild colonic diverticulosis. The appendix is not discretely identified, however there are no inflammatory changes in the right lower quadrant.  Small hiatal hernia. Small duodenal diverticulum within third portion with debris. Normal appendix. VASCULAR/LYMPHATIC: Aortoiliac vessels are normal in course and caliber. Moderate calcific atherosclerosis. No lymphadenopathy by CT size criteria. REPRODUCTIVE: Status post hysterectomy.  Small Bartholin cyst. OTHER: No intraperitoneal free fluid or free air. MUSCULOSKELETAL: Small fat containing inguinal hernias. Focal fat stranding in RIGHT inguinal soft tissues. Grade 1 L4-5 anterolisthesis without spondylolysis. Severe canal stenosis L4-5. Osteopenia. Severe lower lumbar facet arthropathy. IMPRESSION: 1. Recurrent early versus partial small bowel obstruction, transition point in the pelvis consistent with the adhesion. 2. RIGHT inguinal subcutaneous fat stranding seen with cellulitis for contusion. Recommend direct inspection. 3. Grade 1 L4-5 anterolisthesis on degenerative basis. Severe L4-5 canal stenosis . Aortic Atherosclerosis (ICD10-I70.0). Electronically Signed   By: Elon Alas M.D.   On: 11/12/2016 00:48   Dg Abd Portable 1v-small Bowel Obstruction Protocol-initial, 8 Hr Delay  Result Date: 11/12/2016 CLINICAL DATA:  8 hour follow-up for small bowel obstruction EXAM: PORTABLE ABDOMEN - 1 VIEW COMPARISON:  11/12/2016 FINDINGS: 8 hour follow-up film demonstrates persistent small bowel dilatation. Contrast material is noted predominately within the stomach with only minimal contrast within the proximal small bowel. No definitive contrast is  noted in the colon. Continued follow-up is recommended. IMPRESSION: Contrast predominately within the stomach and proximal small bowel. Continued follow-up is recommended. Electronically Signed   By: Inez Catalina M.D.   On: 11/12/2016 19:45   Dg Abd Portable 1v-small Bowel Protocol-position Verification  Result Date: 11/12/2016 CLINICAL DATA:  Nasogastric tube placement. EXAM: PORTABLE ABDOMEN - 1 VIEW COMPARISON:  CT abdomen and pelvis November 12, 2016  FINDINGS: Nasogastric tube tip projects in mid stomach, side port past the GE junction. Contrast in the urinary collecting system. Nonspecific bowel gas pattern. Soft tissue planes and included osseous structures are unchanged. IMPRESSION: Nasogastric tube tip projects in mid stomach. Electronically Signed   By: Elon Alas M.D.   On: 11/12/2016 05:16        Scheduled Meds: . apixaban  5 mg Oral BID  . dofetilide  250 mcg Oral BID   Continuous Infusions: . sodium chloride 75 mL/hr at 11/13/16 1348  . famotidine (PEPCID) IV Stopped (11/13/16 0954)     LOS: 1 day    Nieves Barberi Tanna Furry, MD [facility] Pager [phone]  If 7PM-7AM, please contact night-coverage [email or web address] Password [facility] 11/13/2016, 3:18 PM

## 2016-11-13 NOTE — Progress Notes (Signed)
EKG done, read results to MD. Acute MI/STEMI. MD stated to keep an eye to the EKG results. Will order meds for pt's  BP per MD.

## 2016-11-13 NOTE — Progress Notes (Signed)
Progress Note  Patient Name: Elizabeth Mcconnell Date of Encounter: 11/13/2016  Primary Cardiologist: Dr. Rayann Heman  Subjective   Feeling much much better.  Inpatient Medications    Scheduled Meds:  Continuous Infusions: . sodium chloride 75 mL/hr at 11/13/16 0120  . famotidine (PEPCID) IV    . heparin 1,000 Units/hr (11/13/16 0748)   PRN Meds: albuterol, morphine injection, ondansetron **OR** ondansetron (ZOFRAN) IV, phenol   Vital Signs    Vitals:   11/12/16 2047 11/13/16 0450 11/13/16 0646 11/13/16 0700  BP: (!) 164/78 (!) 154/83 (!) 152/95   Pulse: (!) 102 95 (!) 139   Resp: 18 18 18    Temp: 98.7 F (37.1 C) 98.8 F (37.1 C) 98.5 F (36.9 C)   TempSrc: Oral Oral Oral   SpO2: 94% 94% 92%   Weight:    150 lb (68 kg)  Height:    5\' 5"  (1.651 m)    Intake/Output Summary (Last 24 hours) at 11/13/16 1191 Last data filed at 11/13/16 0919  Gross per 24 hour  Intake             1396 ml  Output             1260 ml  Net              136 ml   Filed Weights   11/13/16 0700  Weight: 150 lb (68 kg)    Telemetry    SR currently, early AM had a couple hours of fast AF - Personally Reviewed  ECG    no new EKGS- Personally Reviewed  Physical Exam   GEN: No acute distress.   Neck: No JVD Cardiac: RRR, no murmurs, rubs, or gallops.  Respiratory: Clear to auscultation bilaterally. GI: Soft, nontender, non-distended  MS: No edema; No deformity. Neuro:  Nonfocal  Psych: Normal affect   Labs    Chemistry Recent Labs Lab 11/11/16 2255 11/12/16 0711 11/13/16 0451  NA 137 140 139  K 4.0 3.8 3.4*  CL 105 110 111  CO2 21* 23 22  GLUCOSE 103* 99 97  BUN 12 10 7   CREATININE 0.72 0.68 0.64  CALCIUM 9.4 8.4* 8.2*  PROT 7.1  --   --   ALBUMIN 4.2  --   --   AST 18  --   --   ALT 16  --   --   ALKPHOS 70  --   --   BILITOT 0.7  --   --   GFRNONAA >60 >60 >60  GFRAA >60 >60 >60  ANIONGAP 11 7 6      Hematology Recent Labs Lab 11/11/16 2255  11/12/16 0711 11/13/16 0451  WBC 11.2* 7.4 9.0  RBC 5.04 4.42 4.25  HGB 14.3 12.3 11.9*  HCT 44.1 39.1 37.8  MCV 87.5 88.5 88.9  MCH 28.4 27.8 28.0  MCHC 32.4 31.5 31.5  RDW 13.6 13.8 13.7  PLT 200 178 167    Cardiac EnzymesNo results for input(s): TROPONINI in the last 168 hours. No results for input(s): TROPIPOC in the last 168 hours.   BNPNo results for input(s): BNP, PROBNP in the last 168 hours.   DDimer No results for input(s): DDIMER in the last 168 hours.   Radiology      Cardiac Studies   No new studies  Patient Profile     71 y.o. female hx of Paroxysmal Afib s/p EPS/ablation last week, AFlutter (ablated remotely), HTN, was admitted with abd pain, SBO.  Assessment & Plan  1. SBO     Surgery service on case     + BM last night     Surgical service saw this AM, to d/c NGT, SBO resolved clinically and cleared for PO cardiac meds     2. Paroxysmal Afib     CHA2DS2Vasc is at least 3     a couple hours this AM of PAFib w/RVR s/p IV lopressor with SR  Will resume her home Eliquis and stop heparin, replace K+ and resume her Tiksoyn       3. HTN     follow now that she is feeling much better, no ongoing belly pain  For questions or updates, please contact [place] Please consult [email or web address] for contact info under Cardiology/STEMI.      Signed, Baldwin [place], PA-C  11/13/2016, 9:22 AM    I have seen, examined the patient, and reviewed the above assessment and plan. On exam, well appearing, RRR. Changes to above are made where necessary.  She did have some afib overnight but is now back in sinus.  Now taking PO medicines.  Will stop IV heparin and resume eliquis.  Resume tikosyn.  Co Sign: Thompson Grayer, MD 11/13/2016 9:42 AM

## 2016-11-13 NOTE — Progress Notes (Signed)
Subjective/Chief Complaint: Pt states BM x 5 overnight, feels better this AM Mobilize Having issues with HTN this AM   Objective: Vital signs in last 24 hours: Temp:  [98.4 F (36.9 C)-98.8 F (37.1 C)] 98.5 F (36.9 C) (09/12 0646) Pulse Rate:  [91-139] 139 (09/12 0646) Resp:  [18] 18 (09/12 0646) BP: (152-164)/(74-95) 152/95 (09/12 0646) SpO2:  [92 %-95 %] 92 % (09/12 0646) Weight:  [68 kg (150 lb)] 68 kg (150 lb) (09/12 0700) Last BM Date: 11/12/16  Intake/Output from previous day: 09/11 0701 - 09/12 0700 In: 1396 [P.O.:30; I.V.:1366] Out: 610 [Emesis/NG output:610] Intake/Output this shift: No intake/output data recorded.  Constitutional: No acute distress, conversant, appears states age. Eyes: Anicteric sclerae, moist conjunctiva, no lid lag Lungs: Clear to auscultation bilaterally, normal respiratory effort CV: irregular rate and rhythm, no murmurs, no peripheral edema, pedal pulses 2+ GI: Soft, no masses or hepatosplenomegaly, non-tender to palpation, normal BS Skin: No rashes, palpation reveals normal turgor Psychiatric: appropriate judgment and insight, oriented to person, place, and time   Lab Results:   Recent Labs  11/12/16 0711 11/13/16 0451  WBC 7.4 9.0  HGB 12.3 11.9*  HCT 39.1 37.8  PLT 178 167   BMET  Recent Labs  11/12/16 0711 11/13/16 0451  NA 140 139  K 3.8 3.4*  CL 110 111  CO2 23 22  GLUCOSE 99 97  BUN 10 7  CREATININE 0.68 0.64  CALCIUM 8.4* 8.2*   Studies/Results: Ct Abdomen Pelvis W Contrast  Result Date: 11/12/2016 CLINICAL DATA:  Lower abdominal pain beginning this evening. History of small bowel obstruction from adhesions, partial colectomy for diverticulitis and hysterectomy. EXAM: CT ABDOMEN AND PELVIS WITH CONTRAST TECHNIQUE: Multidetector CT imaging of the abdomen and pelvis was performed using the standard protocol following bolus administration of intravenous contrast. CONTRAST:  156mL ISOVUE-300 IOPAMIDOL  (ISOVUE-300) INJECTION 61% COMPARISON:  Acute abdominal series May 11, 2016 and CT abdomen and pelvis May 09, 2016 FINDINGS: LOWER CHEST: Lung bases are clear. Included heart size is normal. No pericardial effusion. HEPATOBILIARY: Liver and gallbladder are normal. PANCREAS: Normal. SPLEEN: Normal. ADRENALS/URINARY TRACT: Kidneys are orthotopic, demonstrating symmetric enhancement. No nephrolithiasis, hydronephrosis or solid renal masses. The unopacified ureters are normal in course and caliber. Delayed imaging through the kidneys demonstrates symmetric prompt contrast excretion within the proximal urinary collecting system. Urinary bladder is partially distended and unremarkable. Normal adrenal glands. STOMACH/BOWEL: Mildly dilated small bowel to 3 cm with small bowel feces. Small bowel transition point in central pelvis. Mild associated small bowel wall thickening and inflammation. Sigmoid bowel anastomosis. Mild colonic diverticulosis. The appendix is not discretely identified, however there are no inflammatory changes in the right lower quadrant. Small hiatal hernia. Small duodenal diverticulum within third portion with debris. Normal appendix. VASCULAR/LYMPHATIC: Aortoiliac vessels are normal in course and caliber. Moderate calcific atherosclerosis. No lymphadenopathy by CT size criteria. REPRODUCTIVE: Status post hysterectomy.  Small Bartholin cyst. OTHER: No intraperitoneal free fluid or free air. MUSCULOSKELETAL: Small fat containing inguinal hernias. Focal fat stranding in RIGHT inguinal soft tissues. Grade 1 L4-5 anterolisthesis without spondylolysis. Severe canal stenosis L4-5. Osteopenia. Severe lower lumbar facet arthropathy. IMPRESSION: 1. Recurrent early versus partial small bowel obstruction, transition point in the pelvis consistent with the adhesion. 2. RIGHT inguinal subcutaneous fat stranding seen with cellulitis for contusion. Recommend direct inspection. 3. Grade 1 L4-5 anterolisthesis on  degenerative basis. Severe L4-5 canal stenosis . Aortic Atherosclerosis (ICD10-I70.0). Electronically Signed   By: Thana Farr.D.  On: 11/12/2016 00:48   Dg Abd Portable 1v-small Bowel Obstruction Protocol-initial, 8 Hr Delay  Result Date: 11/12/2016 CLINICAL DATA:  8 hour follow-up for small bowel obstruction EXAM: PORTABLE ABDOMEN - 1 VIEW COMPARISON:  11/12/2016 FINDINGS: 8 hour follow-up film demonstrates persistent small bowel dilatation. Contrast material is noted predominately within the stomach with only minimal contrast within the proximal small bowel. No definitive contrast is noted in the colon. Continued follow-up is recommended. IMPRESSION: Contrast predominately within the stomach and proximal small bowel. Continued follow-up is recommended. Electronically Signed   By: Inez Catalina M.D.   On: 11/12/2016 19:45   Dg Abd Portable 1v-small Bowel Protocol-position Verification  Result Date: 11/12/2016 CLINICAL DATA:  Nasogastric tube placement. EXAM: PORTABLE ABDOMEN - 1 VIEW COMPARISON:  CT abdomen and pelvis November 12, 2016 FINDINGS: Nasogastric tube tip projects in mid stomach, side port past the GE junction. Contrast in the urinary collecting system. Nonspecific bowel gas pattern. Soft tissue planes and included osseous structures are unchanged. IMPRESSION: Nasogastric tube tip projects in mid stomach. Electronically Signed   By: Elon Alas M.D.   On: 11/12/2016 05:16    Anti-infectives: Anti-infectives    None      Assessment/Plan: 71 y/o F with SBO Afib HTN  1. Pt with clinically resolved SBO.  Will DC NGT and start FLD. 2. Mobilize 3. OK for PO Cardiac Rxs   LOS: 1 day    Rosario Jacks., [facility] 11/13/2016

## 2016-11-13 NOTE — Progress Notes (Signed)
Telemetry called, Pt on AFIB 130-140 at this time. Checked pt. Pt asleep, responsive upon waking. Pt denies dizziness or pain. Pt alert and oriented x4. Pt currently on heparin drip at 10 ml/ hour. Will monitor pt.

## 2016-11-13 NOTE — Progress Notes (Signed)
Vital signs and EKG done. Paged MD.

## 2016-11-14 DIAGNOSIS — E876 Hypokalemia: Secondary | ICD-10-CM

## 2016-11-14 LAB — BASIC METABOLIC PANEL
Anion gap: 5 (ref 5–15)
BUN: <5 mg/dL — ABNORMAL LOW (ref 6–20)
CO2: 23 mmol/L (ref 22–32)
Calcium: 8.4 mg/dL — ABNORMAL LOW (ref 8.9–10.3)
Chloride: 110 mmol/L (ref 101–111)
Creatinine, Ser: 0.53 mg/dL (ref 0.44–1.00)
GFR calc Af Amer: >60 mL/min (ref >60)
GFR calc non Af Amer: >60 mL/min (ref >60)
Glucose, Bld: 82 mg/dL (ref 65–99)
Potassium: 3.4 mmol/L — ABNORMAL LOW (ref 3.5–5.1)
Sodium: 138 mmol/L (ref 135–145)

## 2016-11-14 MED ORDER — POTASSIUM CHLORIDE CRYS ER 20 MEQ PO TBCR
40.0000 meq | EXTENDED_RELEASE_TABLET | Freq: Every day | ORAL | Status: DC
  Administered 2016-11-14: 40 meq via ORAL
  Filled 2016-11-14: qty 2

## 2016-11-14 MED ORDER — POTASSIUM CHLORIDE CRYS ER 20 MEQ PO TBCR: 40.0000 meq | EXTENDED_RELEASE_TABLET | Freq: Every day | ORAL | 0 refills | Status: DC

## 2016-11-14 NOTE — Progress Notes (Signed)
Pt is tolerating soft diet, denies abd pain. Discharge instructions given to pt, discharged home accompanied by spouse.

## 2016-11-14 NOTE — Discharge Instructions (Signed)

## 2016-11-14 NOTE — Progress Notes (Signed)
Progress Note  Patient Name: Elizabeth Mcconnell Date of Encounter: 11/14/2016  Primary Cardiologist: Jaime Dome  Subjective   Doing well today, the patient denies CP or SOB.  No new concerns  Inpatient Medications    Scheduled Meds: . apixaban  5 mg Oral BID  . dofetilide  250 mcg Oral BID   Continuous Infusions: . sodium chloride 75 mL/hr at 11/14/16 0450  . famotidine (PEPCID) IV Stopped (11/13/16 2230)   PRN Meds: albuterol, ALPRAZolam, morphine injection, ondansetron **OR** ondansetron (ZOFRAN) IV, phenol   Vital Signs    Vitals:   11/13/16 0920 11/13/16 1502 11/13/16 2024 11/14/16 0524  BP: 140/71 (!) 129/55 (!) 136/55 131/66  Pulse:  85 92 69  Resp:  17 16 16   Temp:  98.3 F (36.8 C) 97.9 F (36.6 C) 97.8 F (36.6 C)  TempSrc:  Oral Oral Oral  SpO2:  98% 98% 95%  Weight:      Height:        Intake/Output Summary (Last 24 hours) at 11/14/16 0806 Last data filed at 11/14/16 0500  Gross per 24 hour  Intake             2685 ml  Output             1250 ml  Net             1435 ml   Filed Weights   11/13/16 0700  Weight: 150 lb (68 kg)    Telemetry    Sinus rhythm, QTc 460 msec - Personally Reviewed  Physical Exam   GEN- The patient is well appearing, alert and oriented x 3 today.   Head- normocephalic, atraumatic Eyes-  Sclera clear, conjunctiva pink Ears- hearing intact Oropharynx- clear Neck- supple, Lungs- Clear to ausculation bilaterally, normal work of breathing Heart- Regular rate and rhythm  GI- soft, NT, ND, + BS Extremities- no clubbing, cyanosis, or edema  MS- no significant deformity or atrophy Skin- no rash or lesion Psych- euthymic mood, full affect Neuro- strength and sensation are intact   Labs    Chemistry Recent Labs Lab 11/11/16 2255 11/12/16 0711 11/13/16 0451 11/14/16 0438  NA 137 140 139 138  K 4.0 3.8 3.4* 3.4*  CL 105 110 111 110  CO2 21* 23 22 23   GLUCOSE 103* 99 97 82  BUN 12 10 7  <5*  CREATININE 0.72  0.68 0.64 0.53  CALCIUM 9.4 8.4* 8.2* 8.4*  PROT 7.1  --   --   --   ALBUMIN 4.2  --   --   --   AST 18  --   --   --   ALT 16  --   --   --   ALKPHOS 70  --   --   --   BILITOT 0.7  --   --   --   GFRNONAA >60 >60 >60 >60  GFRAA >60 >60 >60 >60  ANIONGAP 11 7 6 5      Hematology Recent Labs Lab 11/11/16 2255 11/12/16 0711 11/13/16 0451  WBC 11.2* 7.4 9.0  RBC 5.04 4.42 4.25  HGB 14.3 12.3 11.9*  HCT 44.1 39.1 37.8  MCV 87.5 88.5 88.9  MCH 28.4 27.8 28.0  MCHC 32.4 31.5 31.5  RDW 13.6 13.8 13.7  PLT 200 178 167    Cardiac EnzymesNo results for input(s): TROPONINI in the last 168 hours. No results for input(s): TROPIPOC in the last 168 hours.     Patient Profile  71 y.o. female hx of Paroxysmal Afib s/p EPS/ablation last week, AFlutter (ablated remotely), HTN,was admitted with abd pain, SBO.  Assessment & Plan    1. afib Doing well Continue eliquis and tikosyn at discharge Would start Kdur 40 meq daily given low K  2. HTN Stable No change required today  OK to discharge from cardiology standpoint She already has follow-up in the AF clinic I will see as needed while here  Thompson Grayer MD, [facility] 11/14/2016 8:06 AM

## 2016-11-14 NOTE — Discharge Summary (Signed)
Physician Discharge Summary  Elizabeth Mcconnell UMP:[id number] DOB: 05/24/1945 DOA: 11/11/2016  PCP: Janith Lima, MD  Admit date: 11/11/2016 Discharge date: 11/14/2016  Admitted From:home Disposition:home  Recommendations for Outpatient Follow-up:  1. Follow up with PCP in 1-2 weeks 2. 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 Patient was found to have partial small bowel obstruction.  # Partial small bowel obstruction: The patient clinically improved. NG tube required on admission. He alerted by general surgery. Tolerating diet well. Denied nausea vomiting or abdominal pain. Had bowel movements yesterday. Recommended to follow-up with GI outpatient.   #Paroxysmal atrial fibrillation: Continue oral eliquis and cardiac medications. Evaluated by cardiologist. Patient already has an appointment at cardiology clinic.   #Hypokalemia repleted potassium chloride. Started on potassium chloride on discharge. Recommended to monitor lab in a week to monitor  electrolytes.  Discharge Diagnoses:  Principal Problem:   Partial small bowel obstruction (HCC) Active Problems:   Paroxysmal atrial fibrillation (HCC) Hypokalemia   Discharge Instructions  Discharge Instructions    Call MD for:  difficulty breathing, headache or visual disturbances    Complete by:  As directed    Call MD for:  extreme fatigue    Complete by:  As directed    Call MD for:  hives    Complete by:  As directed    Call MD for:  persistant dizziness or light-headedness    Complete by:  As directed    Call MD for:  persistant nausea and vomiting     Complete by:  As directed    Call MD for:  severe uncontrolled pain    Complete by:  As directed    Call MD for:  temperature >100.4    Complete by:  As directed    Diet - low sodium heart healthy    Complete by:  As directed    Discharge instructions    Complete by:  As directed    Please follow up with your cardiologist, GI as scheduled. Please check  lab, BMP in a week with your PCP or cardiologist.   Increase activity slowly    Complete by:  As directed      Allergies as of 11/14/2016      Reactions   Tramadol Other (See Comments)   Reaction:  Dizziness and weakness    Amoxicillin-pot Clavulanate Nausea And Vomiting, Other (See Comments)   Has patient had a PCN reaction causing immediate rash, facial/tongue/throat swelling, SOB or lightheadedness with hypotension: No Has patient had a PCN reaction causing severe rash involving mucus membranes or skin necrosis: No Has patient had a PCN reaction that required hospitalization No Has patient had a PCN reaction occurring within the last 10 years: No If all of the above answers are "NO", then may proceed with Cephalosporin use.   Flagyl [metronidazole] Nausea And Vomiting   Flecainide Other (See Comments)   Dizziness    Omnicef [cefdinir] Nausea And Vomiting   Avelox [moxifloxacin] Hives, Itching      Medication List    STOP taking these medications   diltiazem 300 MG 24 hr capsule Commonly known as:  CARDIZEM CD     TAKE these medications   acetaminophen 650 MG CR tablet Commonly known  as:  TYLENOL Take 1,300 mg by mouth 2 (two) times daily as needed for pain.   ALPRAZolam 0.25 MG tablet Commonly known as:  XANAX Take 0.25 mg by mouth at bedtime as needed for sleep.   CALCIUM 600+D 600-800 MG-UNIT Tabs Generic drug:  Calcium Carb-Cholecalciferol Take 1 tablet by mouth daily.   cyanocobalamin 2000 MCG tablet Take 1 tablet (2,000 mcg total) by mouth daily.   dofetilide 250 MCG capsule Commonly known as:   TIKOSYN Take 1 capsule (250 mcg total) by mouth 2 (two) times daily.   ELIQUIS 5 MG Tabs tablet Generic drug:  apixaban TAKE 1 TABLET BY MOUTH 2 TIMES DAILY   estradiol 0.05 MG/24HR patch Commonly known as:  VIVELLE-DOT Place 1 patch (0.05 mg total) onto the skin 2 (two) times a week.   fluticasone 110 MCG/ACT inhaler Commonly known as:  FLOVENT HFA Inhale 2 puffs into the lungs 2 (two) times daily.   fluticasone 50 MCG/ACT nasal spray Commonly known as:  FLONASE USE 2 SPRAYS IN EACH NOSTRIL 2 TIMES DAILY   levalbuterol 45 MCG/ACT inhaler Commonly known as:  XOPENEX HFA inhale 1 TO 2 PUFFS BY MOUTH EVERY 4 HOURS AS NEEDED What changed:  See the new instructions.   magnesium oxide 400 MG tablet Commonly known as:  MAG-OX Take 1 tablet (400 mg total) by mouth daily.   montelukast 10 MG tablet Commonly known as:  SINGULAIR Take 1 tablet (10 mg total) by mouth daily.   pantoprazole 40 MG tablet Commonly known as:  PROTONIX Take 1 tablet (40 mg total) by mouth daily.   potassium chloride SA 20 MEQ tablet Commonly known as:  K-DUR,KLOR-CON Take 2 tablets (40 mEq total) by mouth daily.            Discharge Care Instructions        Start     Ordered   11/15/16 0000  potassium chloride SA (K-DUR,KLOR-CON) 20 MEQ tablet  Daily     11/14/16 1055   11/14/16 0000  Increase activity slowly     11/14/16 1055   11/14/16 0000  Diet - low sodium heart healthy     11/14/16 1055   11/14/16 0000  Discharge instructions    Comments:  Please follow up with your cardiologist, GI as scheduled. Please check  lab, BMP in a week with your PCP or cardiologist.   11/14/16 1055   11/14/16 0000  Call MD for:  temperature >100.4     11/14/16 1055   11/14/16 0000  Call MD for:  persistant nausea and vomiting     11/14/16 1055   11/14/16 0000  Call MD for:  severe uncontrolled pain     11/14/16 1055   11/14/16 0000  Call MD for:  difficulty breathing, headache or visual disturbances      11/14/16 1055   11/14/16 0000  Call MD for:  hives     11/14/16 1055   11/14/16 0000  Call MD for:  persistant dizziness or light-headedness     11/14/16 1055   11/14/16 0000  Call MD for:  extreme fatigue     11/14/16 1055     Follow-up Information    Janith Lima, MD. Schedule an appointment as soon as possible for a visit in 1 week(s).   Specialty:  Internal Medicine Contact information: [address] [place] [address] [phone]          Allergies  Allergen Reactions  . Tramadol  Other (See Comments)    Reaction:  Dizziness and weakness   . Amoxicillin-Pot Clavulanate Nausea And Vomiting and Other (See Comments)    Has patient had a PCN reaction causing immediate rash, facial/tongue/throat swelling, SOB or lightheadedness with hypotension: No Has patient had a PCN reaction causing severe rash involving mucus membranes or skin necrosis: No Has patient had a PCN reaction that required hospitalization No Has patient had a PCN reaction occurring within the last 10 years: No If all of the above answers are "NO", then may proceed with Cephalosporin use.  . Flagyl [Metronidazole] Nausea And Vomiting  . Flecainide Other (See Comments)    Dizziness   . Omnicef [Cefdinir] Nausea And Vomiting  . Avelox [Moxifloxacin] Hives and Itching    Consultations: Cardiology  Procedures/Studies: NG tube  Subjective: Seen and examined at bedside. Denied headache, dizziness, nausea, vomiting, chest pain, shortness of breath or abdominal pain. Tolerating liquid diet well and we'll try soft diet. Eager to go home. Had  few bowel movements yesterday.  Discharge Exam: Vitals:   11/13/16 2024 11/14/16 0524  BP: (!) 136/55 131/66  Pulse: 92 69  Resp: 16 16  Temp: 97.9 F (36.6 C) 97.8 F (36.6 C)  SpO2: 98% 95%   Vitals:   11/13/16 0920 11/13/16 1502 11/13/16 2024 11/14/16 0524  BP: 140/71 (!) 129/55 (!) 136/55 131/66  Pulse:  85 92 69  Resp:  17 16 16    Temp:  98.3 F (36.8 C) 97.9 F (36.6 C) 97.8 F (36.6 C)  TempSrc:  Oral Oral Oral  SpO2:  98% 98% 95%  Weight:      Height:        General: Pt is alert, awake, not in acute distress Cardiovascular: RRR, S1/S2 +, no rubs, no gallops Respiratory: CTA bilaterally, no wheezing, no rhonchi Abdominal: Soft, NT, ND, bowel sounds + Extremities: no edema, no cyanosis    The results of significant diagnostics from this hospitalization (including imaging, microbiology, ancillary and laboratory) are listed below for reference.     Microbiology: No results found for this or any previous visit (from the past 240 hour(s)).   Labs: BNP (last 3 results) No results for input(s): BNP in the last 8760 hours. Basic Metabolic Panel:  Recent Labs Lab 11/11/16 2255 11/12/16 0711 11/13/16 0451 11/14/16 0438  NA 137 140 139 138  K 4.0 3.8 3.4* 3.4*  CL 105 110 111 110  CO2 21* 23 22 23   GLUCOSE 103* 99 97 82  BUN 12 10 7  <5*  CREATININE 0.72 0.68 0.64 0.53  CALCIUM 9.4 8.4* 8.2* 8.4*  MG  --   --  1.8  --    Liver Function Tests:  Recent Labs Lab 11/11/16 2255  AST 18  ALT 16  ALKPHOS 70  BILITOT 0.7  PROT 7.1  ALBUMIN 4.2   No results for input(s): LIPASE, AMYLASE in the last 168 hours. No results for input(s): AMMONIA in the last 168 hours. CBC:  Recent Labs Lab 11/11/16 2255 11/12/16 0711 11/13/16 0451  WBC 11.2* 7.4 9.0  HGB 14.3 12.3 11.9*  HCT 44.1 39.1 37.8  MCV 87.5 88.5 88.9  PLT 200 178 167   Cardiac Enzymes: No results for input(s): CKTOTAL, CKMB, CKMBINDEX, TROPONINI in the last 168 hours. BNP: Invalid input(s): POCBNP CBG: No results for input(s): GLUCAP in the last 168 hours. D-Dimer No results for input(s): DDIMER in the last 72 hours. Hgb A1c No results for input(s): HGBA1C in the last 72  hours. Lipid Profile No results for input(s): CHOL, HDL, LDLCALC, TRIG, CHOLHDL, LDLDIRECT in the last 72 hours. Thyroid function studies No results for  input(s): TSH, T4TOTAL, T3FREE, THYROIDAB in the last 72 hours.  Invalid input(s): FREET3 Anemia work up No results for input(s): VITAMINB12, FOLATE, FERRITIN, TIBC, IRON, RETICCTPCT in the last 72 hours. Urinalysis    Component Value Date/Time   COLORURINE YELLOW 11/12/2016 0208   APPEARANCEUR CLEAR 11/12/2016 0208   LABSPEC 1.045 (H) 11/12/2016 0208   PHURINE 7.0 11/12/2016 0208   GLUCOSEU NEGATIVE 11/12/2016 0208   GLUCOSEU NEGATIVE 10/21/2014 1019   HGBUR NEGATIVE 11/12/2016 0208   BILIRUBINUR NEGATIVE 11/12/2016 0208   KETONESUR NEGATIVE 11/12/2016 0208   PROTEINUR NEGATIVE 11/12/2016 0208   UROBILINOGEN 1.0 10/21/2014 1019   NITRITE NEGATIVE 11/12/2016 0208   LEUKOCYTESUR NEGATIVE 11/12/2016 0208   Sepsis Labs Invalid input(s): PROCALCITONIN,  WBC,  LACTICIDVEN Microbiology No results found for this or any previous visit (from the past 240 hour(s)).   Time coordinating discharge: 27 minutes  SIGNED:   Rosita Fire, MD  [facility] 11/14/2016, 10:55 AM  If 7PM-7AM, please contact night-coverage [email or web address] Password [email or web address]

## 2016-11-14 NOTE — Progress Notes (Signed)
Patient ID: Elizabeth Mcconnell, female   DOB: [birth year], 71 y.o.   MRN: [id number]  [facility] Surgery Progress Note     Subjective: CC- SBO Eating breakfast. Denies abdominal pain. Denies n/v. Had another BM yesterday. Passing flatus. Feels ready to go home.  Objective: Vital signs in last 24 hours: Temp:  [97.8 F (36.6 C)-98.3 F (36.8 C)] 97.8 F (36.6 C) (09/13 0524) Pulse Rate:  [69-92] 69 (09/13 0524) Resp:  [16-17] 16 (09/13 0524) BP: (129-136)/(55-66) 131/66 (09/13 0524) SpO2:  [95 %-98 %] 95 % (09/13 0524) Last BM Date: 11/13/16  Intake/Output from previous day: 09/12 0701 - 09/13 0700 In: 2770 [P.O.:640; I.V.:1680; IV Piggyback:450] Out: 1250 [Urine:1250] Intake/Output this shift: Total I/O In: 240 [P.O.:240] Out: -   PE: Gen:  Alert, NAD, pleasant HEENT: EOM's intact, pupils equal and round Card:  RRR, no M/G/R heard Pulm:  CTAB, no W/R/R, effort normal Abd: Soft, NT/ND, +BS, no HSM, no hernia Ext:  No erythema, edema, or tenderness BUE/BLE  Psych: A&Ox3  Skin: no rashes noted, warm and dry  Lab Results:   Recent Labs  11/12/16 0711 11/13/16 0451  WBC 7.4 9.0  HGB 12.3 11.9*  HCT 39.1 37.8  PLT 178 167   BMET  Recent Labs  11/13/16 0451 11/14/16 0438  NA 139 138  K 3.4* 3.4*  CL 111 110  CO2 22 23  GLUCOSE 97 82  BUN 7 <5*  CREATININE 0.64 0.53  CALCIUM 8.2* 8.4*   PT/INR No results for input(s): LABPROT, INR in the last 72 hours. CMP     Component Value Date/Time   NA 138 11/14/2016 0438   NA 141 10/23/2016 1009   K 3.4 (L) 11/14/2016 0438   CL 110 11/14/2016 0438   CO2 23 11/14/2016 0438   GLUCOSE 82 11/14/2016 0438   BUN <5 (L) 11/14/2016 0438   BUN 10 10/23/2016 1009   CREATININE 0.53 11/14/2016 0438   CREATININE 0.81 05/18/2015 1502   CALCIUM 8.4 (L) 11/14/2016 0438   PROT 7.1 11/11/2016 2255   ALBUMIN 4.2 11/11/2016 2255   AST 18 11/11/2016 2255   ALT 16 11/11/2016 2255   ALKPHOS 70 11/11/2016 2255   BILITOT 0.7 11/11/2016 2255   GFRNONAA >60 11/14/2016 0438   GFRAA >60 11/14/2016 0438   Lipase     Component Value Date/Time   LIPASE 36 05/09/2016 2006       Studies/Results: Dg Abd Portable 1v-small Bowel Obstruction Protocol-initial, 8 Hr Delay  Result Date: 11/12/2016 CLINICAL DATA:  8 hour follow-up for small bowel obstruction EXAM: PORTABLE ABDOMEN - 1 VIEW COMPARISON:  11/12/2016 FINDINGS: 8 hour follow-up film demonstrates persistent small bowel dilatation. Contrast material is noted predominately within the stomach with only minimal contrast within the proximal small bowel. No definitive contrast is noted in the colon. Continued follow-up is recommended. IMPRESSION: Contrast predominately within the stomach and proximal small bowel. Continued follow-up is recommended. Electronically Signed   By: Inez Catalina M.D.   On: 11/12/2016 19:45    Anti-infectives: Anti-infectives    None       Assessment/Plan SBO - prior h/o colon resection and hysterectomy - 1 prior SBO in 05/2016 - CT showed Recurrent early versus partial small bowel obstruction, transition point in the pelvis consistent with the adhesion - now having bowel function  ID - none FEN - soft diet VTE - SCDs, Eliquis Foley - none Follow up - none  Plan - if patient tolerating soft diet she  will be ready for discharge from our standpoint. No surgical follow up needed.   LOS: 2 days    Wellington Hampshire , [facility] 11/14/2016, 10:25 AM Pager: [phone] Consults: [phone] Mon-Fri 7:00 am-4:30 pm Sat-Sun 7:00 am-11:30 am

## 2016-11-15 ENCOUNTER — Telehealth: Payer: Self-pay | Admitting: *Deleted

## 2016-11-15 NOTE — Telephone Encounter (Signed)
Pt was on TCM list admitted for (L) abdominal pain was found to have partial small bowel obstruction. Also was in A-fib. Pt was D/C 11/14/16, and will be f/u w/cardiology...[name]

## 2016-11-18 ENCOUNTER — Other Ambulatory Visit: Payer: Self-pay | Admitting: Nurse Practitioner

## 2016-11-18 ENCOUNTER — Ambulatory Visit (HOSPITAL_COMMUNITY)
Admission: RE | Admit: 2016-11-18 | Discharge: 2016-11-18 | Disposition: A | Discharge status: Home / Self Care | Payer: Medicare Other | Source: Ambulatory Visit | Attending: Nurse Practitioner | Admitting: Nurse Practitioner

## 2016-11-18 ENCOUNTER — Other Ambulatory Visit (HOSPITAL_COMMUNITY): Payer: Self-pay | Admitting: Nurse Practitioner

## 2016-11-18 DIAGNOSIS — I481 Persistent atrial fibrillation: Secondary | ICD-10-CM | POA: Insufficient documentation

## 2016-11-18 LAB — BASIC METABOLIC PANEL
Anion gap: 7 (ref 5–15)
BUN: 13 mg/dL (ref 6–20)
CO2: 27 mmol/L (ref 22–32)
Calcium: 9.6 mg/dL (ref 8.9–10.3)
Chloride: 102 mmol/L (ref 101–111)
Creatinine, Ser: 0.74 mg/dL (ref 0.44–1.00)
GFR calc Af Amer: >60 mL/min (ref >60)
GFR calc non Af Amer: >60 mL/min (ref >60)
Glucose, Bld: 86 mg/dL (ref 65–99)
Potassium: 4.5 mmol/L (ref 3.5–5.1)
Sodium: 136 mmol/L (ref 135–145)

## 2016-11-18 LAB — CBC
HCT: 43 % (ref 36.0–46.0)
Hemoglobin: 13.6 g/dL (ref 12.0–15.0)
MCH: 27.6 pg (ref 26.0–34.0)
MCHC: 31.6 g/dL (ref 30.0–36.0)
MCV: 87.4 fL (ref 78.0–100.0)
Platelets: 220 10*3/uL (ref 150–400)
RBC: 4.92 MIL/uL (ref 3.87–5.11)
RDW: 13.7 % (ref 11.5–15.5)
WBC: 6.3 10*3/uL (ref 4.0–10.5)

## 2016-12-02 ENCOUNTER — Encounter: Payer: Self-pay | Admitting: Acute Care

## 2016-12-02 ENCOUNTER — Ambulatory Visit (INDEPENDENT_AMBULATORY_CARE_PROVIDER_SITE_OTHER)
Admission: RE | Admit: 2016-12-02 | Discharge: 2016-12-02 | Disposition: A | Discharge status: Home / Self Care | Payer: Medicare Other | Source: Ambulatory Visit | Attending: Acute Care | Admitting: Acute Care

## 2016-12-02 ENCOUNTER — Other Ambulatory Visit: Payer: Self-pay | Admitting: Internal Medicine

## 2016-12-02 ENCOUNTER — Ambulatory Visit (INDEPENDENT_AMBULATORY_CARE_PROVIDER_SITE_OTHER): Payer: Medicare Other | Admitting: Acute Care

## 2016-12-02 VITALS — BP 126/80 | HR 96 | Ht 65.0 in | Wt 145.4 lb

## 2016-12-02 DIAGNOSIS — R0602 Shortness of breath: Secondary | ICD-10-CM

## 2016-12-02 DIAGNOSIS — F17210 Nicotine dependence, cigarettes, uncomplicated: Secondary | ICD-10-CM

## 2016-12-02 DIAGNOSIS — R059 Cough, unspecified: Secondary | ICD-10-CM

## 2016-12-02 DIAGNOSIS — R05 Cough: Secondary | ICD-10-CM

## 2016-12-02 DIAGNOSIS — J4541 Moderate persistent asthma with (acute) exacerbation: Secondary | ICD-10-CM | POA: Diagnosis not present

## 2016-12-02 DIAGNOSIS — J4540 Moderate persistent asthma, uncomplicated: Secondary | ICD-10-CM

## 2016-12-02 MED ORDER — PREDNISONE 10 MG PO TABS: ORAL_TABLET | ORAL | 0 refills | Status: DC

## 2016-12-02 MED ORDER — DOXYCYCLINE HYCLATE 100 MG PO TABS: 100.0000 mg | ORAL_TABLET | Freq: Two times a day (BID) | ORAL | 0 refills | Status: DC

## 2016-12-02 MED ORDER — HYDROCODONE-HOMATROPINE 5-1.5 MG/5ML PO SYRP: 5.0000 mL | ORAL_SOLUTION | Freq: Four times a day (QID) | ORAL | 0 refills | Status: DC | PRN

## 2016-12-02 MED ORDER — LEVALBUTEROL HCL 0.63 MG/3ML IN NEBU
0.6300 mg | INHALATION_SOLUTION | Freq: Once | RESPIRATORY_TRACT | Status: AC
  Administered 2016-12-02: 0.63 mg via RESPIRATORY_TRACT

## 2016-12-02 NOTE — Progress Notes (Addendum)
History of Present Illness Elizabeth Mcconnell is a 71 y.o. female current smoker with Moderate, Persistent Asthma, Chronic Allergic Rhinitis, & Tobacco Use Disorder.She is followed by Dr. Ashok Cordia.   12/02/2016 Acute OV: Pt. Presents for acute OV. She states she has been to [place] and wonders if she was exposed to something on the plane. Symptoms started 11/20/2016. She states she is coughing up grey secretions . She endorses a fever, chills, fatigue and a cough.She states at times her body is trembling. She endorses body aches and wheezing . She was hospitalized x 2 in September. 11/05/2016>> ablation by Dr. Rayann Heman. She was discharged the same day. She was readmitted 11/11/2016-11/14/2016 for a small Bowel Obstruction. She had endoscopy at that time.She then traveled to [place] for a [employer]. She states she has been compliant with her Flonase, Flovent, Xopenex inhaler and Singulair. She states she has been using her Xopenex at least 4 times daily x the last 2-3 days. She states when she is well, she rarely uses it. She is compliant with her Protonix daily.She did not smoke in  September, but she has started smoking 1-2 cigarettes daily. I explained that she needs to stop smoking completely.She denies fever, chest pain, orthopnea or hemoptysis.  Test Results: CXR 12/02/2016 Stable exam.  No acute cardiopulmonary findings.  CBC Latest Ref Rng & Units 11/18/2016 11/13/2016 11/12/2016  WBC 4.0 - 10.5 K/uL 6.3 9.0 7.4  Hemoglobin 12.0 - 15.0 g/dL 13.6 11.9(L) 12.3  Hematocrit 36.0 - 46.0 % 43.0 37.8 39.1  Platelets 150 - 400 K/uL 220 167 178    BMP Latest Ref Rng & Units 11/18/2016 11/14/2016 11/13/2016  Glucose 65 - 99 mg/dL 86 82 97  BUN 6 - 20 mg/dL 13 <5(L) 7  Creatinine 0.44 - 1.00 mg/dL 0.74 0.53 0.64  BUN/Creat Ratio 12 - 28 - - -  Sodium 135 - 145 mmol/L 136 138 139  Potassium 3.5 - 5.1 mmol/L 4.5 3.4(L) 3.4(L)  Chloride 101 - 111 mmol/L 102 110 111  CO2 22 - 32 mmol/L 27 23 22   Calcium  8.9 - 10.3 mg/dL 9.6 8.4(L) 8.2(L)     ProBNP    Component Value Date/Time   PROBNP 74.8 03/02/2008 0935    PFT    Component Value Date/Time   FEV1PRE 1.58 09/25/2015 0858   FVCPRE 2.85 09/25/2015 0858   TLC 4.75 09/25/2015 0858   DLCOUNC 14.85 09/25/2015 0858   PREFEV1FVCRT 55 09/25/2015 0858    Dg Chest 2 View  Result Date: 12/02/2016 CLINICAL DATA:  Coughing congestion. EXAM: CHEST  2 VIEW COMPARISON:  06/14/2016. FINDINGS: Lungs are hyperexpanded. Left parahilar and left apical pleural-parenchymal opacity is similar to prior. No focal airspace consolidation, pulmonary edema, or pleural effusion. The cardiopericardial silhouette is within normal limits for size. The visualized bony structures of the thorax are intact. IMPRESSION: Stable exam.  No acute cardiopulmonary findings. Electronically Signed   By: Misty Stanley M.D.   On: 12/02/2016 12:00   Ct Abdomen Pelvis W Contrast  Result Date: 11/12/2016 CLINICAL DATA:  Lower abdominal pain beginning this evening. History of small bowel obstruction from adhesions, partial colectomy for diverticulitis and hysterectomy. EXAM: CT ABDOMEN AND PELVIS WITH CONTRAST TECHNIQUE: Multidetector CT imaging of the abdomen and pelvis was performed using the standard protocol following bolus administration of intravenous contrast. CONTRAST:  167mL ISOVUE-300 IOPAMIDOL (ISOVUE-300) INJECTION 61% COMPARISON:  Acute abdominal series May 11, 2016 and CT abdomen and pelvis May 09, 2016 FINDINGS: LOWER CHEST: Lung  bases are clear. Included heart size is normal. No pericardial effusion. HEPATOBILIARY: Liver and gallbladder are normal. PANCREAS: Normal. SPLEEN: Normal. ADRENALS/URINARY TRACT: Kidneys are orthotopic, demonstrating symmetric enhancement. No nephrolithiasis, hydronephrosis or solid renal masses. The unopacified ureters are normal in course and caliber. Delayed imaging through the kidneys demonstrates symmetric prompt contrast excretion within the  proximal urinary collecting system. Urinary bladder is partially distended and unremarkable. Normal adrenal glands. STOMACH/BOWEL: Mildly dilated small bowel to 3 cm with small bowel feces. Small bowel transition point in central pelvis. Mild associated small bowel wall thickening and inflammation. Sigmoid bowel anastomosis. Mild colonic diverticulosis. The appendix is not discretely identified, however there are no inflammatory changes in the right lower quadrant. Small hiatal hernia. Small duodenal diverticulum within third portion with debris. Normal appendix. VASCULAR/LYMPHATIC: Aortoiliac vessels are normal in course and caliber. Moderate calcific atherosclerosis. No lymphadenopathy by CT size criteria. REPRODUCTIVE: Status post hysterectomy.  Small Bartholin cyst. OTHER: No intraperitoneal free fluid or free air. MUSCULOSKELETAL: Small fat containing inguinal hernias. Focal fat stranding in RIGHT inguinal soft tissues. Grade 1 L4-5 anterolisthesis without spondylolysis. Severe canal stenosis L4-5. Osteopenia. Severe lower lumbar facet arthropathy. IMPRESSION: 1. Recurrent early versus partial small bowel obstruction, transition point in the pelvis consistent with the adhesion. 2. RIGHT inguinal subcutaneous fat stranding seen with cellulitis for contusion. Recommend direct inspection. 3. Grade 1 L4-5 anterolisthesis on degenerative basis. Severe L4-5 canal stenosis . Aortic Atherosclerosis (ICD10-I70.0). Electronically Signed   By: Elon Alas M.D.   On: 11/12/2016 00:48   Dg Abd Portable 1v-small Bowel Obstruction Protocol-initial, 8 Hr Delay  Result Date: 11/12/2016 CLINICAL DATA:  8 hour follow-up for small bowel obstruction EXAM: PORTABLE ABDOMEN - 1 VIEW COMPARISON:  11/12/2016 FINDINGS: 8 hour follow-up film demonstrates persistent small bowel dilatation. Contrast material is noted predominately within the stomach with only minimal contrast within the proximal small bowel. No definitive  contrast is noted in the colon. Continued follow-up is recommended. IMPRESSION: Contrast predominately within the stomach and proximal small bowel. Continued follow-up is recommended. Electronically Signed   By: Inez Catalina M.D.   On: 11/12/2016 19:45   Dg Abd Portable 1v-small Bowel Protocol-position Verification  Result Date: 11/12/2016 CLINICAL DATA:  Nasogastric tube placement. EXAM: PORTABLE ABDOMEN - 1 VIEW COMPARISON:  CT abdomen and pelvis November 12, 2016 FINDINGS: Nasogastric tube tip projects in mid stomach, side port past the GE junction. Contrast in the urinary collecting system. Nonspecific bowel gas pattern. Soft tissue planes and included osseous structures are unchanged. IMPRESSION: Nasogastric tube tip projects in mid stomach. Electronically Signed   By: Elon Alas M.D.   On: 11/12/2016 05:16     Past medical hx Past Medical History:  Diagnosis Date  . Adenomatous polyp of colon 10/2013   f/u colo 10/2018  . Allergic rhinitis   . ALLERGIC RHINITIS   . ANXIETY    "situational" (02/14/2015)  . Arthritis    "neck, back, hands" (02/14/2015)  . Asthma   . Atrial fibrillation with RVR ([place])   . Atrial flutter ([place])    failed cardioversion 11/2012; s/p ablation 02-02-2013 by Dr Rayann Heman  . Chronic bronchitis ([place])    "I'll have it most years" (02/14/2015)  . Diverticulosis   . Endometriosis   . Essential hypertension 05/30/2014  . Hepatic steatosis   . Oral candidiasis   . Osteopenia 08/2015   T score -1.7 FRAX 14%/4.4% overall stable from prior DEXA.  [name] Pneumothorax on left 1996   left lower lung collapse  s/p resection  . Small bowel obstruction ([place])   . Tobacco abuse      Social History  Substance Use Topics  . Smoking status: Current Some Day Smoker    Packs/day: 0.25    Years: 43.00    Types: Cigarettes  . Smokeless tobacco: Never Used  . Alcohol use 8.4 oz/week    14 Standard drinks or equivalent per week     Comment: daily wine    Ms.Biagini  reports that she has been smoking Cigarettes.  She has a 10.75 pack-year smoking history. She has never used smokeless tobacco. She reports that she drinks about 8.4 oz of alcohol per week . She reports that she does not use drugs.  Tobacco Cessation: I have spent 4 minutes counseling patient on smoking cessation this visit. We discussed the health risks of continued tobacco abuse.   Past surgical hx, Family hx, Social hx all reviewed.  Current Outpatient Prescriptions on File Prior to Visit  Medication Sig  . acetaminophen (TYLENOL) 650 MG CR tablet Take 1,300 mg by mouth 2 (two) times daily as needed for pain.   [name] ALPRAZolam (XANAX) 0.25 MG tablet Take 0.25 mg by mouth at bedtime as needed for sleep.   . Calcium Carb-Cholecalciferol (CALCIUM 600+D) 600-800 MG-UNIT TABS Take 1 tablet by mouth daily.  . cyanocobalamin 2000 MCG tablet Take 1 tablet (2,000 mcg total) by mouth daily.  [name] dofetilide (TIKOSYN) 250 MCG capsule TAKE 1 CAPSULE BY MOUTH 2 TIMES DAILY  . ELIQUIS 5 MG TABS tablet TAKE 1 TABLET BY MOUTH 2 TIMES DAILY  . estradiol (VIVELLE-DOT) 0.05 MG/24HR patch Place 1 patch (0.05 mg total) onto the skin 2 (two) times a week.  . fluticasone (FLONASE) 50 MCG/ACT nasal spray USE 2 SPRAYS IN EACH NOSTRIL 2 TIMES DAILY  . fluticasone (FLOVENT HFA) 110 MCG/ACT inhaler Inhale 2 puffs into the lungs 2 (two) times daily.  [name] levalbuterol (XOPENEX HFA) 45 MCG/ACT inhaler inhale 1 TO 2 PUFFS BY MOUTH EVERY 4 HOURS AS NEEDED (Patient taking differently: inhale 1 TO 2 PUFFS BY MOUTH EVERY 4 HOURS AS NEEDED FOR SHORTNESS OF BREATH)  . magnesium oxide (MAG-OX) 400 MG tablet Take 1 tablet (400 mg total) by mouth daily.  . montelukast (SINGULAIR) 10 MG tablet Take 1 tablet (10 mg total) by mouth daily.  . pantoprazole (PROTONIX) 40 MG tablet Take 1 tablet (40 mg total) by mouth daily.  . potassium chloride SA (K-DUR,KLOR-CON) 20 MEQ tablet Take 2 tablets (40 mEq total) by mouth daily.   No current  facility-administered medications on file prior to visit.      Allergies  Allergen Reactions  . Tramadol Other (See Comments)    Reaction:  Dizziness and weakness   . Amoxicillin-Pot Clavulanate Nausea And Vomiting and Other (See Comments)    Has patient had a PCN reaction causing immediate rash, facial/tongue/throat swelling, SOB or lightheadedness with hypotension: No Has patient had a PCN reaction causing severe rash involving mucus membranes or skin necrosis: No Has patient had a PCN reaction that required hospitalization No Has patient had a PCN reaction occurring within the last 10 years: No If all of the above answers are "NO", then may proceed with Cephalosporin use.  . Flagyl [Metronidazole] Nausea And Vomiting  . Flecainide Other (See Comments)    Dizziness   . Omnicef [Cefdinir] Nausea And Vomiting  . Avelox [Moxifloxacin] Hives and Itching    Review Of Systems:  Constitutional:   No  weight loss, night  sweats,  Fevers, chills, +fatigue, or  lassitude.  HEENT:   No headaches,  Difficulty swallowing,  Tooth/dental problems, or  Sore throat,                No sneezing, itching, ear ache, nasal congestion, +post nasal drip,   CV:  No chest pain,  Orthopnea, PND, swelling in lower extremities, anasarca, dizziness, palpitations, syncope.   GI  No heartburn, indigestion, abdominal pain, nausea, vomiting, diarrhea, change in bowel habits, loss of appetite, bloody stools.   Resp: + shortness of breath with exertion or at rest.  + excess mucus, + productive cough,  No non-productive cough,  No coughing up of blood.  + change in color of mucus.  + wheezing.  No chest wall deformity  Skin: no rash or lesions.  GU: no dysuria, change in color of urine, no urgency or frequency.  No flank pain, no hematuria   MS:  No joint pain or swelling.  No decreased range of motion.  No back pain.  Psych:  No change in mood or affect. No depression or anxiety.  No memory loss.   Vital  Signs BP 126/80 (BP Location: Left Arm, Cuff Size: Normal)   Pulse 96   Ht 5\' 5"  (1.651 m)   Wt 145 lb 6.4 oz (66 kg)   SpO2 94%   BMI 24.20 kg/m    Physical Exam:  General- No distress,  A&Ox3, pleasant ENT: No sinus tenderness, TM clear, pale nasal mucosa, no oral exudate,+ post nasal drip, no LAN Cardiac: S1, S2, regular rate and rhythm, no murmur Chest: + wheeze/ no rales/ dullness; no accessory muscle use, no nasal flaring, no sternal retractions Abd.: Soft Non-tender, non-distended, BS + Ext: No clubbing cyanosis, edema Neuro:  normal strength Skin: No rashes, warm and dry Psych: normal mood and behavior   Assessment/Plan  Moderate persistent asthma with exacerbation Asthma Flare Plan: CXR today ( Stat) We will call you with results. Doxycycline 100 mg twice daily  For 1 week. avoid dairy products, excessive sunlight, and take with a full glass of water remaining upright for 1 hour afterward.  Probiotic with antibiotic.( Culturelle/Culturette yellow and green box)  Prednisone taper; 10 mg tablets: 4 tabs x 2 days, 3 tabs x 2 days, 2 tabs x 2 days 1 tab x 2 days then stop. Hydromet cough syrup 5 cc at bedtime for cough. Don't drive when sleepy Delsym of equivalent during the day. Sips of water instead of throat clearing Sugar free hard candy for throat soothing. Avoid mint, menthol and chocolate. Mucinex ( Plain) for chest congestion. Xopenex treatment in the office today Work on quitting smoking completely. This is the single most powerful action you can take to improve lung function and decrease your risk of lung cancer. Follow up as is already scheduled with Dr. Ashok Cordia 12/11/2016 Please contact office for sooner follow up if symptoms do not improve or worsen or seek emergency care  Consider flu shot at follow up.   Tobacco Abuse: Pt continues to smoke "a few" cigarettes daily I have spent 3 minutes counseling patient on smoking cessation this  visit.   Magdalen Spatz, NP 12/02/2016  2:20 PM

## 2016-12-02 NOTE — Progress Notes (Signed)
Note reviewed.  Sonia Baller Ashok Cordia, M.D. [facility] Pager:  [phone] After 3pm or if no response, call [phone] 3:05 PM 12/02/16

## 2016-12-02 NOTE — Assessment & Plan Note (Addendum)
Asthma Flare Plan: CXR today ( Stat) We will call you with results. Doxycycline 100 mg twice daily  For 1 week. avoid dairy products, excessive sunlight, and take with a full glass of water remaining upright for 1 hour afterward.  Probiotic with antibiotic.( Culturelle/Culturette yellow and green box)  Prednisone taper; 10 mg tablets: 4 tabs x 2 days, 3 tabs x 2 days, 2 tabs x 2 days 1 tab x 2 days then stop. Hydromet cough syrup 5 cc at bedtime for cough. Don't drive when sleepy Delsym of equivalent during the day. Sips of water instead of throat clearing Sugar free hard candy for throat soothing. Avoid mint, menthol and chocolate. Mucinex ( Plain) for chest congestion. Xopenex treatment in the office today Work on quitting smoking completely. This is the single most powerful action you can take to improve lung function and decrease your risk of lung cancer. Follow up as is already scheduled with Dr. Ashok Cordia 12/11/2016 Please contact office for sooner follow up if symptoms do not improve or worsen or seek emergency care  Consider flu shot at follow up.

## 2016-12-02 NOTE — Patient Instructions (Addendum)
It is nice to meet you today. CXR today ( Stat) We will call you with results. Doxycycline 100 mg twice daily  For 1 week. avoid dairy products, excessive sunlight, and take with a full glass of water remaining upright for 1 hour afterward.  Probiotic with antibiotic.( Culturelle/Culturette yellow and green box)  Prednisone taper; 10 mg tablets: 4 tabs x 2 days, 3 tabs x 2 days, 2 tabs x 2 days 1 tab x 2 days then stop. Hydromet cough syrup 5 cc at bedtime for cough. Delsym of equivalent during the day. Sips of water instead of throat clearing Sugar free hard candy for throat soothing. Avoid mint, menthol and chocolate. Mucinex ( Plain) for chest congestion. Xopenex treatment in the office today Work on quitting smoking completely. This is the single most powerful action you can take to improve lung function and decrease your risk of lung cancer. Follow up as is already scheduled with Dr. Ashok Cordia 12/11/2016 Please contact office for sooner follow up if symptoms do not improve or worsen or seek emergency care  Consider flu shot at follow up.

## 2016-12-03 ENCOUNTER — Other Ambulatory Visit: Payer: Self-pay

## 2016-12-03 MED ORDER — FLUTICASONE PROPIONATE HFA 110 MCG/ACT IN AERO: 2.0000 | INHALATION_SPRAY | Freq: Two times a day (BID) | RESPIRATORY_TRACT | 5 refills | Status: DC

## 2016-12-05 ENCOUNTER — Encounter (HOSPITAL_COMMUNITY): Payer: Self-pay | Admitting: Nurse Practitioner

## 2016-12-05 ENCOUNTER — Ambulatory Visit (HOSPITAL_COMMUNITY)
Admission: RE | Admit: 2016-12-05 | Discharge: 2016-12-05 | Disposition: A | Discharge status: Home / Self Care | Payer: Medicare Other | Source: Ambulatory Visit | Attending: Nurse Practitioner | Admitting: Nurse Practitioner

## 2016-12-05 VITALS — BP 122/64 | HR 100 | Ht 65.0 in | Wt 146.8 lb

## 2016-12-05 DIAGNOSIS — Z888 Allergy status to other drugs, medicaments and biological substances status: Secondary | ICD-10-CM | POA: Diagnosis not present

## 2016-12-05 DIAGNOSIS — Z8249 Family history of ischemic heart disease and other diseases of the circulatory system: Secondary | ICD-10-CM | POA: Diagnosis not present

## 2016-12-05 DIAGNOSIS — Z79899 Other long term (current) drug therapy: Secondary | ICD-10-CM | POA: Diagnosis not present

## 2016-12-05 DIAGNOSIS — Z7901 Long term (current) use of anticoagulants: Secondary | ICD-10-CM | POA: Insufficient documentation

## 2016-12-05 DIAGNOSIS — I480 Paroxysmal atrial fibrillation: Secondary | ICD-10-CM | POA: Diagnosis not present

## 2016-12-05 DIAGNOSIS — M1990 Unspecified osteoarthritis, unspecified site: Secondary | ICD-10-CM | POA: Diagnosis not present

## 2016-12-05 DIAGNOSIS — Z881 Allergy status to other antibiotic agents status: Secondary | ICD-10-CM | POA: Insufficient documentation

## 2016-12-05 DIAGNOSIS — J45909 Unspecified asthma, uncomplicated: Secondary | ICD-10-CM | POA: Insufficient documentation

## 2016-12-05 DIAGNOSIS — Z7951 Long term (current) use of inhaled steroids: Secondary | ICD-10-CM | POA: Diagnosis not present

## 2016-12-05 DIAGNOSIS — Z902 Acquired absence of lung [part of]: Secondary | ICD-10-CM | POA: Insufficient documentation

## 2016-12-05 DIAGNOSIS — F17210 Nicotine dependence, cigarettes, uncomplicated: Secondary | ICD-10-CM | POA: Insufficient documentation

## 2016-12-05 DIAGNOSIS — Z823 Family history of stroke: Secondary | ICD-10-CM | POA: Diagnosis not present

## 2016-12-05 DIAGNOSIS — Z833 Family history of diabetes mellitus: Secondary | ICD-10-CM | POA: Insufficient documentation

## 2016-12-05 DIAGNOSIS — I4891 Unspecified atrial fibrillation: Secondary | ICD-10-CM | POA: Diagnosis present

## 2016-12-05 DIAGNOSIS — Z825 Family history of asthma and other chronic lower respiratory diseases: Secondary | ICD-10-CM | POA: Insufficient documentation

## 2016-12-05 DIAGNOSIS — Z9049 Acquired absence of other specified parts of digestive tract: Secondary | ICD-10-CM | POA: Diagnosis not present

## 2016-12-05 DIAGNOSIS — Z86010 Personal history of colonic polyps: Secondary | ICD-10-CM | POA: Diagnosis not present

## 2016-12-05 DIAGNOSIS — M8580 Other specified disorders of bone density and structure, unspecified site: Secondary | ICD-10-CM | POA: Diagnosis not present

## 2016-12-05 DIAGNOSIS — I10 Essential (primary) hypertension: Secondary | ICD-10-CM | POA: Insufficient documentation

## 2016-12-05 NOTE — Progress Notes (Addendum)
Primary Care Physician: Elizabeth Lima, MD Referring Physician: Dr. Ronny Bacon Kerline Mcconnell is a 71 y.o. female with a h/o afib that underwent her second ablation 9/418, first was for atrial flutter, in 2014, 2nd ablation for atrial fib.[name] She has had good results and has not noted any afib since procedure. She unfortunately has a small bowel obstruction 6 days after the procedure and had to be hospitalized for that.  Was managed with heparin so as not to stop anticoagulation after ablation.  No swallowing or groin issues. She went to [place] and while there picked up an URI, is currently on prednisone for it.   Today, she denies symptoms of palpitations, chest pain, shortness of breath, orthopnea, PND, lower extremity edema, dizziness, presyncope, syncope, or neurologic sequela. The patient is tolerating medications without difficulties and is otherwise without complaint today.   Past Medical History:  Diagnosis Date  . Adenomatous polyp of colon 10/2013   f/u colo 10/2018  . Allergic rhinitis   . ALLERGIC RHINITIS   . ANXIETY    "situational" (02/14/2015)  . Arthritis    "neck, back, hands" (02/14/2015)  . Asthma   . Atrial fibrillation with RVR ([place])   . Atrial flutter ([place])    failed cardioversion 11/2012; s/p ablation 02-02-2013 by Dr Rayann Heman  . Chronic bronchitis ([place])    "I'll have it most years" (02/14/2015)  . Diverticulosis   . Endometriosis   . Essential hypertension 05/30/2014  . Hepatic steatosis   . Oral candidiasis   . Osteopenia 08/2015   T score -1.7 FRAX 14%/4.4% overall stable from prior DEXA.  [name] Pneumothorax on left 1996   left lower lung collapse s/p resection  . Small bowel obstruction ([place])   . Tobacco abuse    Past Surgical History:  Procedure Laterality Date  . ABDOMINAL HYSTERECTOMY    . APPENDECTOMY  1978  . ATRIAL FIBRILLATION ABLATION N/A 11/05/2016   Procedure: Atrial Fibrillation Ablation;  Surgeon: Thompson Grayer, MD;  Location: [facility];  Service: Cardiovascular;  Laterality: N/A;  . ATRIAL FLUTTER ABLATION N/A 02/02/2013   Procedure: ATRIAL FLUTTER ABLATION;  Surgeon: Coralyn Mark, MD;  Location: [facility];  Service: Cardiovascular;  Laterality: N/A;  . CARDIOVERSION N/A 11/27/2012   Procedure: CARDIOVERSION;  Surgeon: Thayer Headings, MD;  Location: [facility];  Service: Cardiovascular;  Laterality: N/A;  . [place]; 1978  . LAPAROSCOPIC PARTIAL COLECTOMY N/A 05/04/2015   Procedure: LAPAROSCOPIC LOW ANTERIOR RESECTION LAPAROSCOPIC LYSIS OF ADHESIONS, SPLENIC FLEXURE MOBILIZATION;  Surgeon: Michael Boston, MD;  [facility];  Service: General;  Laterality: N/A;  . LUNG REMOVAL, PARTIAL Left 02/1965   LLL  . TOTAL ABDOMINAL HYSTERECTOMY W/ BILATERAL SALPINGOOPHORECTOMY  1984   Endometriosis    Current Outpatient Prescriptions  Medication Sig Dispense Refill  . acetaminophen (TYLENOL) 650 MG CR tablet Take 1,300 mg by mouth 2 (two) times daily as needed for pain.     [name] ALPRAZolam (XANAX) 0.25 MG tablet Take 0.25 mg by mouth at bedtime as needed for sleep.     . Calcium Carb-Cholecalciferol (CALCIUM 600+D) 600-800 MG-UNIT TABS Take 1 tablet by mouth daily.    . cyanocobalamin 2000 MCG tablet Take 1 tablet (2,000 mcg total) by mouth daily. 90 tablet 3  . dofetilide (TIKOSYN) 250 MCG capsule TAKE 1 CAPSULE BY MOUTH 2 TIMES DAILY 180 capsule 3  . doxycycline (VIBRA-TABS) 100 MG tablet Take 1 tablet (100 mg total) by  mouth 2 (two) times daily. 14 tablet 0  . ELIQUIS 5 MG TABS tablet TAKE 1 TABLET BY MOUTH 2 TIMES DAILY 180 tablet 3  . estradiol (VIVELLE-DOT) 0.05 MG/24HR patch Place 1 patch (0.05 mg total) onto the skin 2 (two) times a week. 8 patch 12  . fluticasone (FLONASE) 50 MCG/ACT nasal spray USE 2 SPRAYS IN EACH NOSTRIL 2 TIMES DAILY 16 g 5  . fluticasone (FLOVENT HFA) 110 MCG/ACT inhaler Inhale 2 puffs into the lungs 2 (two) times daily. 1 Inhaler 5  . HYDROcodone-homatropine (HYCODAN) 5-1.5 MG/5ML  syrup Take 5 mLs by mouth every 6 (six) hours as needed for cough. 240 mL 0  . levalbuterol (XOPENEX HFA) 45 MCG/ACT inhaler inhale 1 TO 2 PUFFS BY MOUTH EVERY 4 HOURS AS NEEDED (Patient taking differently: inhale 1 TO 2 PUFFS BY MOUTH EVERY 4 HOURS AS NEEDED FOR SHORTNESS OF BREATH) 15 g 3  . magnesium oxide (MAG-OX) 400 MG tablet Take 1 tablet (400 mg total) by mouth daily. 30 tablet 6  . montelukast (SINGULAIR) 10 MG tablet Take 1 tablet (10 mg total) by mouth daily. 30 tablet 5  . pantoprazole (PROTONIX) 40 MG tablet Take 1 tablet (40 mg total) by mouth daily. 45 tablet 0  . potassium chloride (K-DUR,KLOR-CON) 10 MEQ tablet Take 20 mEq by mouth daily.    . predniSONE (DELTASONE) 10 MG tablet Take 4 tabs for 2 days, then 3 tabs for 2 days, 2 tabs for 2 days, then 1 tab for 2 days, then stop. 20 tablet 0   No current facility-administered medications for this encounter.     Allergies  Allergen Reactions  . Tramadol Other (See Comments)    Reaction:  Dizziness and weakness   . Amoxicillin-Pot Clavulanate Nausea And Vomiting and Other (See Comments)    Has patient had a PCN reaction causing immediate rash, facial/tongue/throat swelling, SOB or lightheadedness with hypotension: No Has patient had a PCN reaction causing severe rash involving mucus membranes or skin necrosis: No Has patient had a PCN reaction that required hospitalization No Has patient had a PCN reaction occurring within the last 10 years: No If all of the above answers are "NO", then may proceed with Cephalosporin use.  . Flagyl [Metronidazole] Nausea And Vomiting  . Flecainide Other (See Comments)    Dizziness   . Omnicef [Cefdinir] Nausea And Vomiting  . Avelox [Moxifloxacin] Hives and Itching    Social History   Social History  . Marital status: Married    Spouse name: N/A  . Number of children: 2  . Years of education: N/A   Occupational History  . 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  [employer]    Social History Main Topics  . Smoking status: Current Some Day Smoker    Packs/day: 0.25    Years: 43.00    Types: Cigarettes  . Smokeless tobacco: Never Used  . Alcohol use 8.4 oz/week    14 Standard drinks or equivalent per week     Comment: daily wine  . Drug use: No  . Sexual activity: Yes    Birth control/ protection: Surgical, Post-menopausal     Comment: HYST-1st intercourse 71 yo-Fewer than 5 partners   Other Topics Concern  . Not on file   Social History Narrative   Lives with spouse;    2 children      [facility]:   She is from [facility]. 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 Previously has attended business school. Previously did office work and Programmer, applications at [employer]. She has also worked for [employer]. She does have a cat currently. She has indoor plants. No mold exposure.    Family History  Problem Relation Age of Onset  . Asthma Maternal Grandfather   . Stroke Father 62  . Hypertension Father   . Heart disease Father   . Diabetes Mother   . Asthma Child   . Colon cancer Neg Hx   . Rectal cancer Neg Hx   . Stomach cancer Neg Hx     ROS- All systems are reviewed and negative except as per the HPI above  Physical Exam: Vitals:   12/05/16 1357  BP: 122/64  Pulse: 100  Weight: 146 lb 12.8 oz (66.6 kg)  Height: 5\' 5"  (1.651 m)   Wt Readings from Last 3 Encounters:  12/05/16 146 lb 12.8 oz (66.6 kg)  12/02/16 145 lb 6.4 oz (66 kg)  11/13/16 150 lb (68 kg)    Labs: Lab Results  Component Value Date   NA 136 11/18/2016   K 4.5 11/18/2016   CL 102 11/18/2016   CO2 27 11/18/2016   GLUCOSE 86 11/18/2016   BUN 13 11/18/2016   CREATININE 0.74 11/18/2016   CALCIUM 9.6 11/18/2016   PHOS 2.8 05/08/2015   MG 1.8 11/13/2016   Lab Results  Component Value Date   INR 1.06 05/09/2016   Lab Results  Component Value Date   CHOL 261 (H) 05/30/2015   HDL 65.70 05/30/2015   LDLCALC 157 (H) 05/30/2015   TRIG  192.0 (H) 05/30/2015     GEN- The patient is well appearing, alert and oriented x 3 today.   Head- normocephalic, atraumatic Eyes-  Sclera clear, conjunctiva pink Ears- hearing intact Oropharynx- clear Neck- supple, no JVP Lymph- no cervical lymphadenopathy Lungs- Clear to ausculation bilaterally, normal work of breathing Heart- Regular rate and rhythm, no murmurs, rubs or gallops, PMI not laterally displaced GI- soft, NT, ND, + BS Extremities- no clubbing, cyanosis, or edema MS- no significant deformity or atrophy Skin- no rash or lesion Psych- euthymic mood, full affect Neuro- strength and sensation are intact  EKG-NSR at 100 bpm, pr int 146 ms, qrs int 86 ms, qtc 459 ms Epic records reviewed    Assessment and Plan: 1 Paroxysmal afib Doing well staying in [place], s/p second ablation 9/4 Continue Tikosyn at 250 mcg bid Continue eliquis 5 mg bid  F/u with Dr. Rayann Heman 12/10  Geroge Baseman. Dotti Busey, [facility] [address] [place], [place] [address] [phone]

## 2016-12-11 ENCOUNTER — Encounter: Payer: Self-pay | Admitting: Pulmonary Disease

## 2016-12-11 ENCOUNTER — Ambulatory Visit (INDEPENDENT_AMBULATORY_CARE_PROVIDER_SITE_OTHER): Payer: Medicare Other | Admitting: Pulmonary Disease

## 2016-12-11 VITALS — BP 130/60 | HR 94 | Ht 65.0 in | Wt 147.0 lb

## 2016-12-11 DIAGNOSIS — F17210 Nicotine dependence, cigarettes, uncomplicated: Secondary | ICD-10-CM | POA: Diagnosis not present

## 2016-12-11 DIAGNOSIS — F17200 Nicotine dependence, unspecified, uncomplicated: Secondary | ICD-10-CM

## 2016-12-11 DIAGNOSIS — J4540 Moderate persistent asthma, uncomplicated: Secondary | ICD-10-CM | POA: Diagnosis not present

## 2016-12-11 DIAGNOSIS — Z23 Encounter for immunization: Secondary | ICD-10-CM

## 2016-12-11 DIAGNOSIS — J309 Allergic rhinitis, unspecified: Secondary | ICD-10-CM

## 2016-12-11 NOTE — Progress Notes (Signed)
Subjective:    Patient ID: Elizabeth Mcconnell, female    DOB: [birth year], 71 y.o.   MRN: [id number]  C.C.:  Follow-up for Moderate, Persistent Asthma, Chronic Allergic Rhinitis, & Tobacco Use Disorder.  HPI  Moderate, persistent asthma: Previously on Flovent and Singulair with Xopenex as a rescue inhaler. Last seen in the office on 10/1 & treated for a flare with doxycycline and a prednisone taper. She reports she is improving. She does still have a mild cough that is only intermittently productive. She reports only mild dyspnea from her baseline. No wheezing. No nocturnal awakenings with any coughing or breathing problems this week. She reports she hasn't needed her rescue inhaler much this week.   Chronic allergic rhinitis: Previously prescribed Flonase and Singulair. She reports minimal to no sinus congestion & drainage at this time.   Tobacco use disorder: At last appointment patient was continuing to smoke cigarettes intermittently during times of high stress.  Review of Systems No fever, chills, or sweats. No chest pain, pressure, or tightness. No abdominal pain or nausea.   Allergies  Allergen Reactions  . Tramadol Other (See Comments)    Reaction:  Dizziness and weakness   . Amoxicillin-Pot Clavulanate Nausea And Vomiting and Other (See Comments)    Has patient had a PCN reaction causing immediate rash, facial/tongue/throat swelling, SOB or lightheadedness with hypotension: No Has patient had a PCN reaction causing severe rash involving mucus membranes or skin necrosis: No Has patient had a PCN reaction that required hospitalization No Has patient had a PCN reaction occurring within the last 10 years: No If all of the above answers are "NO", then may proceed with Cephalosporin use.  . Flagyl [Metronidazole] Nausea And Vomiting  . Flecainide Other (See Comments)    Dizziness   . Omnicef [Cefdinir] Nausea And Vomiting  . Avelox [Moxifloxacin] Hives and Itching    Current  Outpatient Prescriptions on File Prior to Visit  Medication Sig Dispense Refill  . acetaminophen (TYLENOL) 650 MG CR tablet Take 1,300 mg by mouth 2 (two) times daily as needed for pain.     [name] ALPRAZolam (XANAX) 0.25 MG tablet Take 0.25 mg by mouth at bedtime as needed for sleep.     . Calcium Carb-Cholecalciferol (CALCIUM 600+D) 600-800 MG-UNIT TABS Take 1 tablet by mouth daily.    . cyanocobalamin 2000 MCG tablet Take 1 tablet (2,000 mcg total) by mouth daily. 90 tablet 3  . dofetilide (TIKOSYN) 250 MCG capsule TAKE 1 CAPSULE BY MOUTH 2 TIMES DAILY 180 capsule 3  . ELIQUIS 5 MG TABS tablet TAKE 1 TABLET BY MOUTH 2 TIMES DAILY 180 tablet 3  . estradiol (VIVELLE-DOT) 0.05 MG/24HR patch Place 1 patch (0.05 mg total) onto the skin 2 (two) times a week. 8 patch 12  . fluticasone (FLONASE) 50 MCG/ACT nasal spray USE 2 SPRAYS IN EACH NOSTRIL 2 TIMES DAILY 16 g 5  . fluticasone (FLOVENT HFA) 110 MCG/ACT inhaler Inhale 2 puffs into the lungs 2 (two) times daily. 1 Inhaler 5  . levalbuterol (XOPENEX HFA) 45 MCG/ACT inhaler inhale 1 TO 2 PUFFS BY MOUTH EVERY 4 HOURS AS NEEDED (Patient taking differently: inhale 1 TO 2 PUFFS BY MOUTH EVERY 4 HOURS AS NEEDED FOR SHORTNESS OF BREATH) 15 g 3  . magnesium oxide (MAG-OX) 400 MG tablet Take 1 tablet (400 mg total) by mouth daily. 30 tablet 6  . montelukast (SINGULAIR) 10 MG tablet Take 1 tablet (10 mg total) by mouth daily. 30 tablet 5  .  pantoprazole (PROTONIX) 40 MG tablet Take 1 tablet (40 mg total) by mouth daily. 45 tablet 0  . potassium chloride (K-DUR,KLOR-CON) 10 MEQ tablet Take 20 mEq by mouth daily.    [name] doxycycline (VIBRA-TABS) 100 MG tablet Take 1 tablet (100 mg total) by mouth 2 (two) times daily. (Patient not taking: Reported on 12/11/2016) 14 tablet 0  . HYDROcodone-homatropine (HYCODAN) 5-1.5 MG/5ML syrup Take 5 mLs by mouth every 6 (six) hours as needed for cough. (Patient not taking: Reported on 12/11/2016) 240 mL 0  . predniSONE (DELTASONE) 10  MG tablet Take 4 tabs for 2 days, then 3 tabs for 2 days, 2 tabs for 2 days, then 1 tab for 2 days, then stop. (Patient not taking: Reported on 12/11/2016) 20 tablet 0   No current facility-administered medications on file prior to visit.     Past Medical History:  Diagnosis Date  . Adenomatous polyp of colon 10/2013   f/u colo 10/2018  . Allergic rhinitis   . ALLERGIC RHINITIS   . ANXIETY    "situational" (02/14/2015)  . Arthritis    "neck, back, hands" (02/14/2015)  . Asthma   . Atrial fibrillation with RVR ([place])   . Atrial flutter ([place])    failed cardioversion 11/2012; s/p ablation 02-02-2013 by Dr Rayann Heman  . Chronic bronchitis ([place])    "I'll have it most years" (02/14/2015)  . Diverticulosis   . Endometriosis   . Essential hypertension 05/30/2014  . Hepatic steatosis   . Oral candidiasis   . Osteopenia 08/2015   T score -1.7 FRAX 14%/4.4% overall stable from prior DEXA.  [name] Pneumothorax on left 1996   left lower lung collapse s/p resection  . Small bowel obstruction ([place])   . Tobacco abuse     Past Surgical History:  Procedure Laterality Date  . ABDOMINAL HYSTERECTOMY    . APPENDECTOMY  1978  . ATRIAL FIBRILLATION ABLATION N/A 11/05/2016   Procedure: Atrial Fibrillation Ablation;  Surgeon: Thompson Grayer, MD;  Location: [facility];  Service: Cardiovascular;  Laterality: N/A;  . ATRIAL FLUTTER ABLATION N/A 02/02/2013   Procedure: ATRIAL FLUTTER ABLATION;  Surgeon: Coralyn Mark, MD;  Location: [facility];  Service: Cardiovascular;  Laterality: N/A;  . CARDIOVERSION N/A 11/27/2012   Procedure: CARDIOVERSION;  Surgeon: Thayer Headings, MD;  Location: [facility];  Service: Cardiovascular;  Laterality: N/A;  . [place]; 1978  . LAPAROSCOPIC PARTIAL COLECTOMY N/A 05/04/2015   Procedure: LAPAROSCOPIC LOW ANTERIOR RESECTION LAPAROSCOPIC LYSIS OF ADHESIONS, SPLENIC FLEXURE MOBILIZATION;  Surgeon: Michael Boston, MD;  [facility];  Service: General;  Laterality:  N/A;  . LUNG REMOVAL, PARTIAL Left 02/1965   LLL  . TOTAL ABDOMINAL HYSTERECTOMY W/ BILATERAL SALPINGOOPHORECTOMY  1984   Endometriosis    Family History  Problem Relation Age of Onset  . Asthma Maternal Grandfather   . Stroke Father 55  . Hypertension Father   . Heart disease Father   . Diabetes Mother   . Asthma Child   . Colon cancer Neg Hx   . Rectal cancer Neg Hx   . Stomach cancer Neg Hx     Social History   Social History  . Marital status: Married    Spouse name: N/A  . Number of children: 2  . Years of education: N/A   Occupational History  . 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  [employer]   Social History Main Topics  . Smoking status: Former Smoker  Packs/day: 0.25    Years: 43.00    Types: Cigarettes  . Smokeless tobacco: Never Used     Comment: smokes only 1-2 cigarettes intermittently   . Alcohol use 8.4 oz/week    14 Standard drinks or equivalent per week     Comment: daily wine  . Drug use: No  . Sexual activity: Yes    Birth control/ protection: Surgical, Post-menopausal     Comment: HYST-1st intercourse 71 yo-Fewer than 5 partners   Other Topics Concern  . None   Social History Narrative   Lives with spouse;    2 children      [facility]:   She is from [place]. 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 Previously has attended business school. Previously did office work and Programmer, applications at [employer]. She has also worked for [employer]. She does have a cat currently. She has indoor plants. No mold exposure.      Objective:   Physical Exam BP 130/60 (BP Location: Right Arm, Patient Position: Sitting, Cuff Size: Normal)   Pulse 94   Ht 5\' 5"  (1.651 m)   Wt 147 lb (66.7 kg)   SpO2 96%   BMI 24.46 kg/m   General:  Awake. No distress. Alert. Integument:  Warm & dry. No rash on exposed skin. No bruising on exposed skin. Extremities:  No cyanosis or clubbing.  HEENT:  Moist mucus membranes.  No oral ulcers. No scleral icterus. Cardiovascular:  Regular rate. No edema. Unable to appreciate JVD.  Pulmonary:  Clear bilaterally to auscultation. Normal work of breathing on room air. Abdomen: Soft. Normal bowel sounds. Mildly protuberant. Musculoskeletal:  Normal bulk and tone. No joint deformity or effusion appreciated. Neurological:  Cranial nerves 2-12 grossly in tact. No meningismus. Moving all 4 extremities equally.   PFT 09/25/15: FVC 2.85 L (95%) FEV1 1.58 L (70%) FEV1/FVC 0.55 FEF 25-75 0.61 L (33%) TLC 4.75 L (92%) RV 73% DLCO uncorrected 58%  IMAGING CXR PA/LAT 12/02/16 (personally reviewed by me):  Tenting of left hemidiaphragm. No focal opacity or mass appreciated. No pleural effusion. Heart normal in size & mediastinum normal in contour.  CXR PA/LAT 06/14/16 (previously reviewed by me):  Resolved right middle lobe opacity. No parenchymal opacity or mass appreciated otherwise. No pleural effusion. Heart normal in size & mediastinum normal in contour.  CXR PA/LAT 03/25/16 (previously reviewed by me): Patchy opacification within right middle lobe. No pleural effusion appreciated. Heart normal in size & mediastinum normal in contour.  CXR PA/LAT 08/02/14 (per radiologist): No active disease. Hyperinflation. Fibrotic changes LUL.  CARDIAC EKG (08/16/15):  RBB & QTc 484ms.  TTE (08/05/12): LV with mild basal septal hypertrophy. EF 60-65%. Normal regional wall motion. Grade 1 diastolic dysfunction. LA & RA normal in size. RV normal in size & function. No aortic stenosis or regurg. No mitral stenosis & trivial regurg. No pulmonic regurg.     Assessment & Plan:  71 y.o. female with underlying moderate, persistent asthma as well as chronic allergic rhinitis. Patient is truly trying to completely avoid tobacco use. I encouraged this given her underlying lung dysfunction. Overall her allergies seem to be well-controlled at this time and she seems to recovered from her recent asthma  exacerbation. I suspect it was likely viral in etiology. Reviewing her previous chest x-ray demonstrates no signs of pneumonia. I instructed the patient to contact our office if she has any new breathing problems or questions before her next appointment.  1. Moderate, persistent asthma:  Continuing patient on Singulair & Flovent 229mcg inhaled BID. Consider de-escalation of Flovent at next appointment. 2. Chronic allergic rhinitis:  Controlled with Flonase & Singulair. No changes. 3. Tobacco use disorder:  Patient counseled for over 3 minutes on the need for complete and consistent tobacco cessation. 4. Health maintenance: Status post Tdap February 2014, Prevnar April 2016, & Pneumovax February 2016. Administering high-dose influenza vaccine today. 5. Follow-up: Return to clinic in 3 months.   Sonia Baller Ashok Cordia, M.D. [facility] Pulmonary [facility] Critical [facility] Pager:  [phone] After 3pm or if no response, call [phone] 1:49 PM 12/11/16

## 2016-12-11 NOTE — Patient Instructions (Addendum)
   Continue using your inhalers and medications as prescribed.  Please call our office if you have any new breathing problems or questions before your next appointment.

## 2016-12-24 ENCOUNTER — Other Ambulatory Visit: Payer: Self-pay | Admitting: Internal Medicine

## 2017-01-27 ENCOUNTER — Other Ambulatory Visit: Payer: Self-pay | Admitting: Pulmonary Disease

## 2017-02-10 ENCOUNTER — Ambulatory Visit: Payer: Medicare Other | Admitting: Internal Medicine

## 2017-02-14 ENCOUNTER — Telehealth: Payer: Self-pay | Admitting: Internal Medicine

## 2017-02-14 NOTE — Telephone Encounter (Signed)
Scheduled patient 12/31 with Dr. Rayann Heman.  She was grateful for assistance.

## 2017-02-14 NOTE — Telephone Encounter (Signed)
New message  Pt call requesting to speak with RN about getting an sooner appt with Dr. Rayann Heman post surgical procedure.

## 2017-02-19 ENCOUNTER — Telehealth: Payer: Self-pay | Admitting: Adult Health

## 2017-02-19 ENCOUNTER — Encounter: Payer: Self-pay | Admitting: Internal Medicine

## 2017-02-19 ENCOUNTER — Ambulatory Visit: Payer: Medicare Other | Admitting: Internal Medicine

## 2017-02-19 DIAGNOSIS — J208 Acute bronchitis due to other specified organisms: Secondary | ICD-10-CM | POA: Diagnosis not present

## 2017-02-19 DIAGNOSIS — F17210 Nicotine dependence, cigarettes, uncomplicated: Secondary | ICD-10-CM | POA: Diagnosis not present

## 2017-02-19 DIAGNOSIS — J209 Acute bronchitis, unspecified: Secondary | ICD-10-CM

## 2017-02-19 MED ORDER — DOXYCYCLINE HYCLATE 100 MG PO TABS: 100.0000 mg | ORAL_TABLET | Freq: Two times a day (BID) | ORAL | 0 refills | Status: DC

## 2017-02-19 MED ORDER — ACETAMINOPHEN-CODEINE #3 300-30 MG PO TABS: ORAL_TABLET | ORAL | 0 refills | Status: DC

## 2017-02-19 MED ORDER — PREDNISONE 10 MG PO TABS: ORAL_TABLET | ORAL | 0 refills | Status: DC

## 2017-02-19 NOTE — Progress Notes (Signed)
Subjective:    Patient ID: Elizabeth Mcconnell, female    DOB: 1946-02-07    MRN: [id number]  C.C.:  Follow-up for Moderate, Persistent Asthma, Allergic Rhinitis, & Ongoing Tobacco Use.    Brief patient profile:  Dr Ammie Dalton note  09/25/15  Moderate, Persistent Asthma: Prescribed Flovent HFA & Singulair. Patient switched to Flovent by her PCP due to concerns with interaction with her atrial fibrillation. Patient with mild obstruction on spirometry. No exacerbations since last appointment. Reports mild intermittently. No nocturnal awakenings with any breathing problems. She is now using her Xopenex rescue inhaler every 2-3 days.   Allergic Rhinitis: On Flonase & Singulair. Denies any significant sinus congestion or drainage.   Ongoing Tobacco Use:  She is still smoking intermittently & sporadically. She reports she is able to stop for long periods, months, at a time. Her husband also smokes intermittently. Previously tried to quit using nicotine patches.  rec  Continue taking your Flovent inhaler as prescribed. You can use 2 inhalations twice daily if you notice more coughing, wheezing, or breathing problems.  We are decreasing your Flonase nasal spray to 1 spray in each nostril twice daily. Call me if you notice any new symptoms.  I will see you back in 3 months. Call me if you need to be seen sooner.   TESTS ORDERED: 1. Spirometry & DLCO at next appointment 2. 6MWT at next appointment 3. Venous Duplex Left Lower Extremity     03/25/2016 acute extended ov/Elizabeth Mcconnell re:  Chronic asthma maint flovent hfa ? Still smoking ?  Chief Complaint  Patient presents with  . Acute Visit    Pt c/o chest and sinus congestion for the past 2 wks. She feels very tired and states that she has chills "all the time"- no fever. She has had a prod cough with grey sputum.    baseline doing fine on  flovent hfa / singulair not needing any rescue with little flare of cough attributes to  Uri dec 2017 took doxy  and all better until 2 weeks prior to OV with cough thick grey mucus x 2 weeks worse in ams with lots of sinus congestion and grey mucus, some chills but no fever / sweats - even with flare not using xopenex and not sure how much she can "maybe 2 pffs a day"  rec Doxycycline 100 mg twice daily x 10 days Prednisone 10 mg take  4 each am x 2 days,   2 each am x 2 days,  1 each am x 2 days and stop  Work on inhaler technique:  For cough / congestion mucinex 1200 mg every 12 hours as needed and use the xopenex hfa more regularly to improve the mucus flow     02/19/2017 acute extended ov/Elizabeth Mcconnell re: acute cough  Chief Complaint  Patient presents with  . Acute Visit    Pt c/o cough for the past 3 days. She also c/o tightness in her throat and upper chest. She has had fatigue and chills also.   maint on flovent 2bid and rarely xopenex only a few times a day even when flare for cough or chest tightness assoc with nasal congestion but no sob   No obvious day to day or daytime variability or assoc excess/ purulent sputum or mucus plugs or hemoptysis or cp or chest tightness, subjective wheeze or overt sinus or hb symptoms. No unusual exposure hx or h/o childhood pna/ asthma or knowledge of premature birth.  Sleeping ok flat without  nocturnal  or early am exacerbation  of respiratory  c/o's or need for noct saba. Also denies any obvious fluctuation of symptoms with weather or environmental changes or other aggravating or alleviating factors except as outlined above   Current Allergies, Complete Past Medical History, Past Surgical History, Family History, and Social History were reviewed in [employer] record.  ROS  The following are not active complaints unless bolded Hoarseness, sore throat, dysphagia, dental problems, itching, sneezing,  nasal congestion or discharge of excess mucus or purulent secretions, ear ache,   fever, chills, sweats, unintended wt loss or wt gain, classically  pleuritic or exertional cp,  orthopnea pnd or leg swelling, presyncope, palpitations, abdominal pain, anorexia, nausea, vomiting, diarrhea  or change in bowel habits or change in bladder habits, change in stools or change in urine, dysuria, hematuria,  rash, arthralgias, visual complaints, headache, numbness, weakness or ataxia or problems with walking or coordination,  change in mood/affect or memory.        Current Meds  Medication Sig  . acetaminophen (TYLENOL) 650 MG CR tablet Take 1,300 mg by mouth 2 (two) times daily as needed for pain.   [name] ALPRAZolam (XANAX) 0.25 MG tablet TAKE 1 TABLET BY MOUTH 2 TIMES DAILY AS NEEDED FOR anxiety  . Calcium Carb-Cholecalciferol (CALCIUM 600+D) 600-800 MG-UNIT TABS Take 1 tablet by mouth daily.  . cyanocobalamin 2000 MCG tablet Take 1 tablet (2,000 mcg total) by mouth daily.  [name] dofetilide (TIKOSYN) 250 MCG capsule TAKE 1 CAPSULE BY MOUTH 2 TIMES DAILY  . ELIQUIS 5 MG TABS tablet TAKE 1 TABLET BY MOUTH 2 TIMES DAILY  . estradiol (VIVELLE-DOT) 0.05 MG/24HR patch Place 1 patch (0.05 mg total) onto the skin 2 (two) times a week.  . fluticasone (FLONASE) 50 MCG/ACT nasal spray USE 2 SPRAYS IN EACH NOSTRIL 2 TIMES DAILY  . fluticasone (FLOVENT HFA) 110 MCG/ACT inhaler Inhale 2 puffs into the lungs 2 (two) times daily.  [name] levalbuterol (XOPENEX HFA) 45 MCG/ACT inhaler inhale 1 TO 2 PUFFS BY MOUTH EVERY 4 HOURS AS NEEDED  . magnesium oxide (MAG-OX) 400 MG tablet Take 1 tablet (400 mg total) by mouth daily.  . montelukast (SINGULAIR) 10 MG tablet Take 1 tablet (10 mg total) by mouth daily.  . potassium chloride (K-DUR,KLOR-CON) 10 MEQ tablet Take 20 mEq by mouth daily.                         Objective:   Physical Exam  amb hoarse wf nad   02/19/2017      147   03/25/16 156 lb 12.8 oz (71.1 kg)  02/12/16 155 lb 6.4 oz (70.5 kg)  11/28/15 150 lb 12 oz (68.4 kg)     Vital signs reviewed - Note on arrival 02 sats  97% on RA     Mild bilateral  nasal turbinate swelling/nonspecific s purulent d/c  Pulmonary:  Junky insp and exp rhonchi bilaterally   HEENT: nl dentition,  and oropharynx. Nl external ear canals without cough reflex- moderate bilateral non-specific turbinate edema     NECK :  without JVD/Nodes/TM/ nl carotid upstrokes bilaterally   LUNGS: no acc muscle use,  Nl contour chest with junky insp and exp rhonchi bilaterally    CV:  RRR  no s3 or murmur or increase in P2, and no edema   ABD:  soft and nontender with nl inspiratory excursion in the supine position. No bruits or organomegaly appreciated, bowel  sounds nl  MS:  Nl gait/ ext warm without deformities, calf tenderness, cyanosis or clubbing No obvious joint restrictions   SKIN: warm and dry without lesions    NEURO:  alert, approp, nl sensorium with  no motor or cerebellar deficits apparent.         PFT 09/25/15: FVC 2.85 L (95%) FEV1 1.58 L (70%) FEV1/FVC 0.55 FEF 25-75 0.61 L (33%) TLC 4.75 L (92%) RV 73% DLCO uncorrected 58%     TTE (08/05/12): LV with mild basal septal hypertrophy. EF 60-65%. Normal regional wall motion. Grade 1 diastolic dysfunction. LA & RA normal in size. RV normal in size & function. No aortic stenosis or regurg. No mitral stenosis & trivial regurg. No pulmonic regurg.          Assessment & Plan:

## 2017-02-19 NOTE — Telephone Encounter (Signed)
Spoke with the pt's spouse  I have scheduled her to see MW today at 2:45

## 2017-02-19 NOTE — Patient Instructions (Addendum)
Doxycycline 100 mg twice daily x 10 days  Prednisone 10 mg take  4 each am x 2 days,   2 each am x 2 days,  1 each am x 2 days and stop   Work on inhaler technique:  relax and gently blow all the way out then take a nice smooth deep breath back in, triggering the inhaler at same time you start breathing in.  Hold for up to 5 seconds if you can. Blow out thru nose. Rinse and gargle with water when done    For cough / congestion mucinex  Or mucinex dm 1200 mg every 12 hours as needed and use the xopenex hfa more regularly to improve the mucus flow  And use Tylenol #3 as needed when can afford to be sleepy    Try prilosec otc 20mg   Take 30-60 min before first meal of the day and Pepcid ac (famotidine) 20 mg one @  bedtime until cough is completely gone for at least a week without the need for cough suppression     GERD (REFLUX)  is an extremely common cause of respiratory symptoms just like yours , many times with no obvious heartburn at all.    It can be treated with medication, but also with lifestyle changes including elevation of the head of your bed (ideally with 6 inch  bed blocks),  Smoking cessation, avoidance of late meals, excessive alcohol, and avoid fatty foods, chocolate, peppermint, colas, red wine, and acidic juices such as orange juice.  NO MINT OR MENTHOL PRODUCTS SO NO COUGH DROPS   USE SUGARLESS CANDY INSTEAD (Jolley ranchers or Stover's or Life Savers) or even ice chips will also do - the key is to swallow to prevent all throat clearing. NO OIL BASED VITAMINS - use powdered substitutes.    Please schedule a follow up office visit in 6 weeks, call sooner if needed

## 2017-02-20 ENCOUNTER — Encounter: Payer: Self-pay | Admitting: Internal Medicine

## 2017-02-20 NOTE — Assessment & Plan Note (Signed)
freq flares noted c/w difficult to control airways dz.  DDX of  difficult airways management almost all start with A and  include Adherence, Ace Inhibitors, Acid Reflux, Active Sinus Disease, Alpha 1 Antitripsin deficiency, Anxiety masquerading as Airways dz,  ABPA,  Allergy(esp in young), Aspiration (esp in elderly), Adverse effects of meds,  Active smokers, A bunch of PE's (a small clot burden can't cause this syndrome unless there is already severe underlying pulm or vascular dz with poor reserve) plus two Bs  = Bronchiectasis and Beta blocker use..and one C= CHF   Adherence is always the initial "prime suspect" and is a multilayered concern that requires a "trust but verify" approach in every patient - starting with knowing how to use medications, especially inhalers, correctly, keeping up with refills and understanding the fundamental difference between maintenance and prns vs those medications only taken for a very short course and then stopped and not refilled.  - - The proper method of use, as well as anticipated side effects, of a metered-dose inhaler are discussed and demonstrated to the patient. Improved effectiveness after extensive coaching during this visit to a level of approximately 75 % from a baseline of 50 %   Active smoking (see separate a/p)   ? Active sinus dz > doxy x 10 days and f./u sinus ct if not better as has multiple abx intol  ? Allergy > Prednisone 10 mg take  4 each am x 2 days,   2 each am x 2 days,  1 each am x 2 days and stop   ? Acid (or non-acid) GERD > always difficult to exclude as up to 75% of pts in some series report no assoc GI/ Heartburn symptoms> rec max (24h)  acid suppression and diet restrictions/ reviewed and instructions given in writing.  -  Of the three most common causes of  Sub-acute or recurrent or chronic cough, only one (GERD)  can actually contribute to/ trigger  the other two (asthma and post nasal drip syndrome)  and perpetuate the cylce of  cough.  While not intuitively obvious, many patients with chronic low grade reflux do not cough until there is a primary insult that disturbs the protective epithelial barrier and exposes sensitive nerve endings.   This is typically viral but can be direct physical injury such as with an endotracheal tube.   The point is that once this occurs, it is difficult to eliminate the cycle  using anything but a maximally effective acid suppression regimen at least in the short run, accompanied by an appropriate diet to address non acid GERD and control / eliminate the cough itself for at least 3 days.     I had an extended discussion with the patient reviewing all relevant studies completed to date and  lasting 15 to 20 minutes of a 25 minute acute visit    Each maintenance medication was reviewed in detail including most importantly the difference between maintenance and prns and under what circumstances the prns are to be triggered using an action plan format that is not reflected in the computer generated alphabetically organized AVS.    Please see AVS for specific instructions unique to this visit that I personally wrote and verbalized to the the pt in detail and then reviewed with pt  by my nurse highlighting any  changes in therapy recommended at today's visit to their plan of care.

## 2017-02-20 NOTE — Assessment & Plan Note (Signed)

## 2017-02-21 ENCOUNTER — Telehealth: Payer: Self-pay | Admitting: Internal Medicine

## 2017-02-21 NOTE — Telephone Encounter (Signed)
Pt states that her cough is not improving and she is not getting restful sleep.  I advised pt that she was prescribed Tylenol #3 on her OV 2 days ago and that should be helping to suppress her cough.  Pt states she did not pick up this medication because it is a pain med.  I advised pt that per MW's recs she is taking this for cough suppression and this should help with her current complaint.  Pt expressed understanding.  Nothing further needed.

## 2017-02-26 ENCOUNTER — Ambulatory Visit: Payer: Medicare Other | Admitting: Internal Medicine

## 2017-03-03 ENCOUNTER — Ambulatory Visit: Payer: Medicare Other | Admitting: Internal Medicine

## 2017-03-03 ENCOUNTER — Encounter: Payer: Self-pay | Admitting: Internal Medicine

## 2017-03-03 VITALS — BP 120/64 | HR 86 | Ht 65.0 in | Wt 142.0 lb

## 2017-03-03 DIAGNOSIS — R600 Localized edema: Secondary | ICD-10-CM

## 2017-03-03 DIAGNOSIS — I480 Paroxysmal atrial fibrillation: Secondary | ICD-10-CM

## 2017-03-03 DIAGNOSIS — R0602 Shortness of breath: Secondary | ICD-10-CM | POA: Diagnosis not present

## 2017-03-03 NOTE — Patient Instructions (Signed)
Medication Instructions:  Your physician recommends that you continue on your current medications as directed. Please refer to the Current Medication list given to you today.  Labwork: Your physician recommends that you return for lab work in: BMET and Magnesium Panel  Testing/Procedures: None ordered  Follow-Up: .Your physician recommends that you schedule a follow-up appointment in: 3 MONTHS with Dr. Rayann Heman   If you need a refill on your cardiac medications before your next appointment, please call your pharmacy.

## 2017-03-03 NOTE — Progress Notes (Signed)
PCP: Janith Lima, MD Primary Cardiologist: Dr Jamey Ripa Elizabeth Mcconnell is a 71 y.o. female who presents today for routine electrophysiology followup.  Since his recent afib ablation, the patient reports doing very well.  she denies procedure related complications and is pleased with the results of the procedure.  Today, she denies symptoms of palpitations, chest pain, shortness of breath,  lower extremity edema, dizziness, presyncope, or syncope.  The patient is otherwise without complaint today.   Past Medical History:  Diagnosis Date  . Adenomatous polyp of colon 10/2013   f/u colo 10/2018  . Allergic rhinitis   . ALLERGIC RHINITIS   . ANXIETY    "situational" (02/14/2015)  . Arthritis    "neck, back, hands" (02/14/2015)  . Asthma   . Atrial fibrillation with RVR ([place])   . Atrial flutter ([place])    failed cardioversion 11/2012; s/p ablation 02-02-2013 by Dr Rayann Heman  . Chronic bronchitis ([place])    "I'll have it most years" (02/14/2015)  . Diverticulosis   . Endometriosis   . Essential hypertension 05/30/2014  . Hepatic steatosis   . Oral candidiasis   . Osteopenia 08/2015   T score -1.7 FRAX 14%/4.4% overall stable from prior DEXA.  [name] Pneumothorax on left 1996   left lower lung collapse s/p resection  . Small bowel obstruction ([place])   . Tobacco abuse    Past Surgical History:  Procedure Laterality Date  . ABDOMINAL HYSTERECTOMY    . APPENDECTOMY  1978  . ATRIAL FIBRILLATION ABLATION N/A 11/05/2016   Procedure: Atrial Fibrillation Ablation;  Surgeon: Thompson Grayer, MD;  Location: [facility];  Service: Cardiovascular;  Laterality: N/A;  . ATRIAL FLUTTER ABLATION N/A 02/02/2013   Procedure: ATRIAL FLUTTER ABLATION;  Surgeon: Coralyn Mark, MD;  Location: [facility];  Service: Cardiovascular;  Laterality: N/A;  . CARDIOVERSION N/A 11/27/2012   Procedure: CARDIOVERSION;  Surgeon: Thayer Headings, MD;  Location: [facility];  Service: Cardiovascular;  Laterality: N/A;    . [place]; 1978  . LAPAROSCOPIC PARTIAL COLECTOMY N/A 05/04/2015   Procedure: LAPAROSCOPIC LOW ANTERIOR RESECTION LAPAROSCOPIC LYSIS OF ADHESIONS, SPLENIC FLEXURE MOBILIZATION;  Surgeon: Michael Boston, MD;  Location: [facility];  Service: General;  Laterality: N/A;  . LUNG REMOVAL, PARTIAL Left 02/1965   LLL  . TOTAL ABDOMINAL HYSTERECTOMY W/ BILATERAL SALPINGOOPHORECTOMY  1984   Endometriosis    ROS- all systems are personally reviewed and negatives except as per HPI above  Current Outpatient Medications  Medication Sig Dispense Refill  . acetaminophen (TYLENOL) 650 MG CR tablet Take 1,300 mg by mouth 2 (two) times daily as needed for pain.     [name] ALPRAZolam (XANAX) 0.25 MG tablet TAKE 1 TABLET BY MOUTH 2 TIMES DAILY AS NEEDED FOR anxiety 60 tablet 2  . Calcium Carb-Cholecalciferol (CALCIUM 600+D) 600-800 MG-UNIT TABS Take 1 tablet by mouth daily.    . cyanocobalamin 2000 MCG tablet Take 1 tablet (2,000 mcg total) by mouth daily. 90 tablet 3  . dofetilide (TIKOSYN) 250 MCG capsule TAKE 1 CAPSULE BY MOUTH 2 TIMES DAILY 180 capsule 3  . ELIQUIS 5 MG TABS tablet TAKE 1 TABLET BY MOUTH 2 TIMES DAILY 180 tablet 3  . estradiol (VIVELLE-DOT) 0.05 MG/24HR patch Place 1 patch (0.05 mg total) onto the skin 2 (two) times a week. 8 patch 12  . fluticasone (FLONASE) 50 MCG/ACT nasal spray USE 2 SPRAYS IN EACH NOSTRIL 2 TIMES DAILY 16 g 5  . fluticasone (FLOVENT  HFA) 110 MCG/ACT inhaler Inhale 2 puffs into the lungs 2 (two) times daily. 1 Inhaler 5  . levalbuterol (XOPENEX HFA) 45 MCG/ACT inhaler inhale 1 TO 2 PUFFS BY MOUTH EVERY 4 HOURS AS NEEDED 15 g 3  . magnesium oxide (MAG-OX) 400 MG tablet Take 1 tablet (400 mg total) by mouth daily. 30 tablet 6  . montelukast (SINGULAIR) 10 MG tablet Take 1 tablet (10 mg total) by mouth daily. 30 tablet 5  . potassium chloride (K-DUR,KLOR-CON) 10 MEQ tablet Take 20 mEq by mouth daily.     No current facility-administered medications for this visit.      Physical Exam: Vitals:   03/03/17 1232  BP: 120/64  Pulse: 86  Weight: 142 lb (64.4 kg)  Height: 5\' 5"  (1.651 m)    GEN- The patient is well appearing, alert and oriented x 3 today.   Head- normocephalic, atraumatic Eyes-  Sclera clear, conjunctiva pink Ears- hearing intact Oropharynx- clear Lungs- Clear to ausculation bilaterally, normal work of breathing Heart- Regular rate and rhythm, no murmurs, rubs or gallops, PMI not laterally displaced GI- soft, NT, ND, + BS Extremities- no clubbing, cyanosis, or edema  EKG tracing ordered today is personally reviewed and shows sinus rhythm 86 bpm, otherwise normal ekg  Assessment and Plan:  1. Paroxysmal atrial fibrillation Doing well s/p ablation chads2vasc score is 3.  Continue anticoagulation She will consider stopping tikosyn upon return  2. Post termination pauses Resolved s/p ablation  Return to see me in 3 months  Thompson Grayer MD, [facility] 03/03/2017 12:35 PM

## 2017-03-04 LAB — BASIC METABOLIC PANEL
BUN/Creatinine Ratio: 15 (ref 12–28)
BUN: 10 mg/dL (ref 8–27)
CO2: 21 mmol/L (ref 20–29)
Calcium: 10 mg/dL (ref 8.7–10.3)
Chloride: 103 mmol/L (ref 96–106)
Creatinine, Ser: 0.66 mg/dL (ref 0.57–1.00)
GFR calc Af Amer: 101 mL/min/{1.73_m2} (ref >59)
GFR calc non Af Amer: 88 mL/min/{1.73_m2} (ref >59)
Glucose: 85 mg/dL (ref 65–99)
Potassium: 4.7 mmol/L (ref 3.5–5.2)
Sodium: 142 mmol/L (ref 134–144)

## 2017-03-04 LAB — MAGNESIUM: Magnesium: 2.1 mg/dL (ref 1.6–2.3)

## 2017-03-05 NOTE — Addendum Note (Signed)
Encounter addended by: Sherran Needs, NP on: 03/05/2017 8:43 AM  Actions taken: Sign clinical note

## 2017-03-24 ENCOUNTER — Other Ambulatory Visit: Payer: Self-pay | Admitting: Internal Medicine

## 2017-03-24 MED ORDER — MONTELUKAST SODIUM 10 MG PO TABS
10.0000 mg | ORAL_TABLET | Freq: Every day | ORAL | 2 refills | Status: DC
Start: 2017-03-24 — End: 2017-05-26

## 2017-03-31 ENCOUNTER — Telehealth: Payer: Self-pay | Admitting: *Deleted

## 2017-03-31 NOTE — Telephone Encounter (Signed)
Prior authorization done via cover my meds for generic vivelle dot patch., will wait for response from [employer]

## 2017-04-01 NOTE — Telephone Encounter (Signed)
Patch approved through 03/03/18. Pt aware as well as pharmacy.

## 2017-04-02 ENCOUNTER — Encounter: Payer: Self-pay | Admitting: Internal Medicine

## 2017-04-02 ENCOUNTER — Telehealth: Payer: Self-pay | Admitting: Internal Medicine

## 2017-04-02 ENCOUNTER — Ambulatory Visit: Payer: Medicare Other | Admitting: Internal Medicine

## 2017-04-02 VITALS — BP 130/80 | HR 107 | Ht 65.0 in | Wt 139.0 lb

## 2017-04-02 DIAGNOSIS — F17210 Nicotine dependence, cigarettes, uncomplicated: Secondary | ICD-10-CM

## 2017-04-02 DIAGNOSIS — J4541 Moderate persistent asthma with (acute) exacerbation: Secondary | ICD-10-CM | POA: Diagnosis not present

## 2017-04-02 MED ORDER — DOXYCYCLINE HYCLATE 100 MG PO TABS
100.0000 mg | ORAL_TABLET | Freq: Two times a day (BID) | ORAL | 0 refills | Status: DC
Start: 2017-04-02 — End: 2017-05-14

## 2017-04-02 MED ORDER — PREDNISONE 10 MG PO TABS
ORAL_TABLET | ORAL | 0 refills | Status: DC
Start: 2017-04-02 — End: 2017-05-14

## 2017-04-02 NOTE — Assessment & Plan Note (Signed)
>   69min  Under quite a bit of stress related to husband's death 1st week in 2017-04-22 from [facility] while on trip - not ready / willing to commit to quit at this time so rec review when returns for pfts

## 2017-04-02 NOTE — Progress Notes (Signed)
Subjective:    Patient ID: Elizabeth Mcconnell, female    DOB: Jul 07, 1945    MRN: [id number]  Brief patient profile:  7 yowf active smoking previous pt of Dr Ashok Cordia with chonic mod astma vs GOLD II copd with ab component      History of Present Illness  03/25/2016 acute extended ov/Elizabeth Mcconnell re:  Chronic asthma maint flovent hfa ? Still smoking ?  Chief Complaint  Patient presents with  . Acute Visit    Pt c/o chest and sinus congestion for the past 2 wks. She feels very tired and states that she has chills "all the time"- no fever. She has had a prod cough with grey sputum.    baseline doing fine on  flovent hfa / singulair not needing any rescue with little flare of cough attributes to  Uri dec 2017 took doxy and all better until 2 weeks prior to OV with cough thick grey mucus x 2 weeks worse in ams with lots of sinus congestion and grey mucus, some chills but no fever / sweats - even with flare not using xopenex and not sure how much she can "maybe 2 pffs a day"  rec Doxycycline 100 mg twice daily x 10 days Prednisone 10 mg take  4 each am x 2 days,   2 each am x 2 days,  1 each am x 2 days and stop  Work on inhaler technique: For cough / congestion mucinex 1200 mg every 12 hours as needed and use the xopenex hfa more regularly to improve the mucus flow     02/19/2017 acute extended ov/Elizabeth Mcconnell re: acute cough  Chief Complaint  Patient presents with  . Acute Visit    Pt c/o cough for the past 3 days. She also c/o tightness in her throat and upper chest. She has had fatigue and chills also.   maint on flovent 2bid and rarely xopenex only a few times a day even when flare for cough or chest tightness assoc with nasal congestion but no sob rec Doxycycline 100 mg twice daily x 10 days Prednisone 10 mg take  4 each am x 2 days,   2 each am x 2 days,  1 each am x 2 days and stop  Work on inhaler technique:   For cough / congestion mucinex  Or mucinex dm 1200 mg every 12 hours as needed and  use the xopenex hfa more regularly to improve the mucus flow  And use Tylenol #3 as needed when can afford to be sleepy Try prilosec otc 20mg   Take 30-60 min before first meal of the day and Pepcid ac (famotidine) 20 mg one @  bedtime until cough is completely gone for at least a week without the need for cough suppression GERD  Diet      04/02/2017  f/u ov/Elizabeth Mcconnell re:  Chief Complaint  Patient presents with  . Follow-up    Cough had improved, but then worsened again 4 days ago. She feels some tightness in her throat. Cough has been prod with thick, grey sputum.  She is also having some SOB since cough started back.  She has used her xopenex for rescue a few times since she started feeling bad again.   was all better then acutely worse  since 03/30/17 with nasal congestion/ sneezing then cough/ wheeze and sob  Dyspnea:  Not needing xopenex at all at baseline / only twice daily since acutely ill and not sob at rest p xopenex prn  Cough: grey mucus/ worse at hs  Sleep: after xanax does fine/ did not do well with Tyl #3   No obvious day to day or daytime variability or assoc excess/ purulent sputum or mucus plugs or hemoptysis or cp or chest tightness, subjective wheeze or overt sinus or hb symptoms. No unusual exposure hx or h/o childhood pna/ asthma or knowledge of premature birth.  Sleeping ok flat without nocturnal  or early am exacerbation  of respiratory  c/o's or need for noct saba. Also denies any obvious fluctuation of symptoms with weather or environmental changes or other aggravating or alleviating factors except as outlined above   Current Allergies, Complete Past Medical History, Past Surgical History, Family History, and Social History were reviewed in [employer] record.  ROS  The following are not active complaints unless bolded Hoarseness, sore throat, dysphagia, dental problems, itching, sneezing,  nasal congestion or discharge of excess mucus or purulent  secretions, ear ache,   fever, chills, sweats, unintended wt loss or wt gain, classically pleuritic or exertional cp,  orthopnea pnd or leg swelling, presyncope, palpitations, abdominal pain, anorexia, nausea, vomiting, diarrhea  or change in bowel habits or change in bladder habits, change in stools or change in urine, dysuria, hematuria,  rash, arthralgias, visual complaints, headache, numbness, weakness or ataxia or problems with walking or coordination,  change in mood/affect or memory.        Current Meds  Medication Sig  . acetaminophen (TYLENOL) 650 MG CR tablet Take 1,300 mg by mouth 2 (two) times daily as needed for pain.   [name] ALPRAZolam (XANAX) 0.25 MG tablet TAKE 1 TABLET BY MOUTH 2 TIMES DAILY AS NEEDED FOR anxiety  . Calcium Carb-Cholecalciferol (CALCIUM 600+D) 600-800 MG-UNIT TABS Take 1 tablet by mouth daily.  . cyanocobalamin 2000 MCG tablet Take 1 tablet (2,000 mcg total) by mouth daily.  [name] dofetilide (TIKOSYN) 250 MCG capsule TAKE 1 CAPSULE BY MOUTH 2 TIMES DAILY  . ELIQUIS 5 MG TABS tablet TAKE 1 TABLET BY MOUTH 2 TIMES DAILY  . estradiol (VIVELLE-DOT) 0.05 MG/24HR patch Place 1 patch (0.05 mg total) onto the skin 2 (two) times a week.  . fluticasone (FLONASE) 50 MCG/ACT nasal spray USE 2 SPRAYS IN EACH NOSTRIL 2 TIMES DAILY  . fluticasone (FLOVENT HFA) 110 MCG/ACT inhaler Inhale 2 puffs into the lungs 2 (two) times daily.  [name] levalbuterol (XOPENEX HFA) 45 MCG/ACT inhaler inhale 1 TO 2 PUFFS BY MOUTH EVERY 4 HOURS AS NEEDED  . magnesium oxide (MAG-OX) 400 MG tablet Take 1 tablet (400 mg total) by mouth daily.  . montelukast (SINGULAIR) 10 MG tablet Take 1 tablet (10 mg total) by mouth daily.  . potassium chloride (K-DUR,KLOR-CON) 10 MEQ tablet Take 20 mEq by mouth daily.                      Objective:   Physical Exam  amb wf slt nasal tone to voice / mint gum in mouth   02/19/2017      147   03/25/16 156 lb 12.8 oz (71.1 kg)  02/12/16 155 lb 6.4 oz (70.5 kg)    11/28/15 150 lb 12 oz (68.4 kg)    Vital signs reviewed - Note on arrival 02 sats  95% on RA      HEENT: nl dentition,    and oropharynx. Nl external ear canals without cough reflex - mild  bilateral non-specific turbinate edema    NECK :  without JVD/Nodes/TM/  nl carotid upstrokes bilaterally   LUNGS: no acc muscle use,  Nl contour chest with junky bilateral pan exp rhonchi    CV:  RRR  no s3 or murmur or increase in P2, and no edema   ABD:  soft and nontender with nl inspiratory excursion in the supine position. No bruits or organomegaly appreciated, bowel sounds nl  MS:  Nl gait/ ext warm without deformities, calf tenderness, cyanosis or clubbing No obvious joint restrictions   SKIN: warm and dry without lesions    NEURO:  alert, approp, nl sensorium with  no motor or cerebellar deficits apparent.         Studies reviewed 04/02/2017     PFT 09/25/15: FVC 2.85 L (95%) FEV1 1.58 L (70%) FEV1/FVC 0.55 FEF 25-75 0.61 L (33%) TLC 4.75 L (92%) RV 73% DLCO uncorrected 58%     TTE (08/05/12): LV with mild basal septal hypertrophy. EF 60-65%. Normal regional wall motion. Grade 1 diastolic dysfunction. LA & RA normal in size. RV normal in size & function. No aortic stenosis or regurg. No mitral stenosis & trivial regurg. No pulmonic regurg.          Assessment & Plan:

## 2017-04-02 NOTE — Patient Instructions (Addendum)
Doxycycline 100 mg twice daily x 10 days  Prednisone 10 mg take  4 each am x 2 days,   2 each am x 2 days,  1 each am x 2 days and stop    For cough / congestion mucinex  Or mucinex dm 1200 mg every 12 hours as needed and use the xopenex hfa more regularly to improve the mucus flow   - up to 2 puffs every 4 hours as needed     Try prilosec otc 20mg   Take 30-60 min before first meal of the day and Pepcid ac (famotidine) 20 mg one @  bedtime until cough is completely gone for at least a week without the need for cough suppression  GERD (REFLUX)  is an extremely common cause of respiratory symptoms just like yours , many times with no obvious heartburn at all.    It can be treated with medication, but also with lifestyle changes including elevation of the head of your bed (ideally with 6 inch  bed blocks),  Smoking cessation, avoidance of late meals, excessive alcohol, and avoid fatty foods, chocolate, peppermint, colas, red wine, and acidic juices such as orange juice.  NO MINT OR MENTHOL PRODUCTS SO NO COUGH DROPS   USE SUGARLESS CANDY INSTEAD (Jolley ranchers or Stover's or Life Savers) or even ice chips will also do - the key is to swallow to prevent all throat clearing. NO OIL BASED VITAMINS - use powdered substitutes.   Please schedule a follow up visit in 3 months but call sooner if needed with pfts on return

## 2017-04-02 NOTE — Telephone Encounter (Signed)
Pt is aware of below message and voiced her understanding. Nothing further is needed. 

## 2017-04-02 NOTE — Telephone Encounter (Signed)
Pt is returning call. Cb is [phone].

## 2017-04-02 NOTE — Telephone Encounter (Signed)
Per MW verbally- doxy 100mg  bid for 7 days.  lmtcb x1 to make pt aware that Rx is correct.

## 2017-04-02 NOTE — Assessment & Plan Note (Signed)
Spirometry  09/25/15 FEV1 1.58  (70%)  Ratio 55 on flovent 110 2bid  - 04/02/2017  After extensive coaching HFA effectiveness =   85% (Ti a bit short  In setting of chronic smoking I'm concerned she's actually developing copd and may benefit from more aggressive bronchodilator like symb 160 2bid   For now treat acute flare as before with doxy/ pred x 6 days, increase xopenx prn and gerd rx short term then return for full pfts   I had an extended discussion with the patient reviewing all relevant studies completed to date and  lasting 15 to 20 minutes of a 25 minute acute visit    Each maintenance medication was reviewed in detail including most importantly the difference between maintenance and prns and under what circumstances the prns are to be triggered using an action plan format that is not reflected in the computer generated alphabetically organized AVS.    Please see AVS for specific instructions unique to this visit that I personally wrote and verbalized to the the pt in detail and then reviewed with pt  by my nurse highlighting any  changes in therapy recommended at today's visit to their plan of care.

## 2017-04-17 ENCOUNTER — Telehealth: Payer: Self-pay | Admitting: Internal Medicine

## 2017-04-17 MED ORDER — FLUTICASONE PROPIONATE 50 MCG/ACT NA SUSP: NASAL | 5 refills | Status: DC

## 2017-04-17 NOTE — Telephone Encounter (Signed)
Called pt and left her a message stating I was sending a refill of the flonase to her preferred pharmacy.  Nothing further needed at this current time.

## 2017-05-12 IMAGING — CT CT ABD-PELV W/ CM
2 of 5 series · 16 of 46 positions shown, 18 images · IV contrast (OMNIPAQUE 300)
Comparison: 12/10/2013

CLINICAL DATA: Severe diffuse abdominal pain and constipation for 2
weeks.

EXAM:
CT ABDOMEN AND PELVIS WITH CONTRAST
TECHNIQUE: Multidetector CT imaging of the abdomen and pelvis was performed
using the standard protocol following bolus administration of
intravenous contrast.
CONTRAST:  100mL OMNIPAQUE IOHEXOL 300 MG/ML  SOLN

[Series 2: abd/ pelvis · axial · 0.69mm/px · z∈[-440,-74]mm · 13 of 83 slices shown, 15 images]
[im 5/83  soft-tissue]
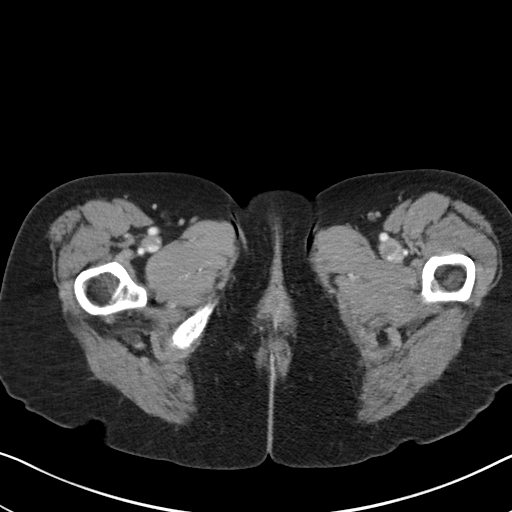
[im 5/83  bone]
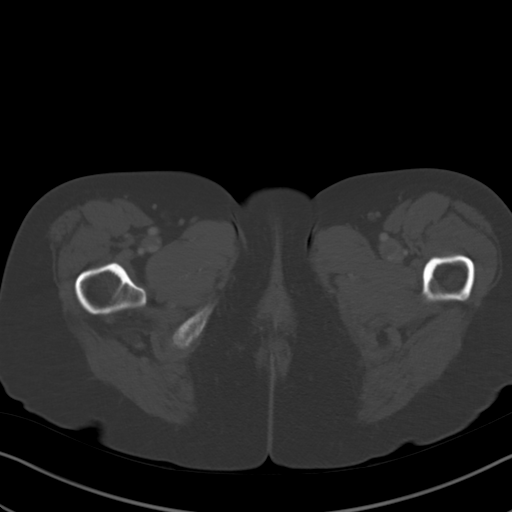
[im 13/83  soft-tissue]
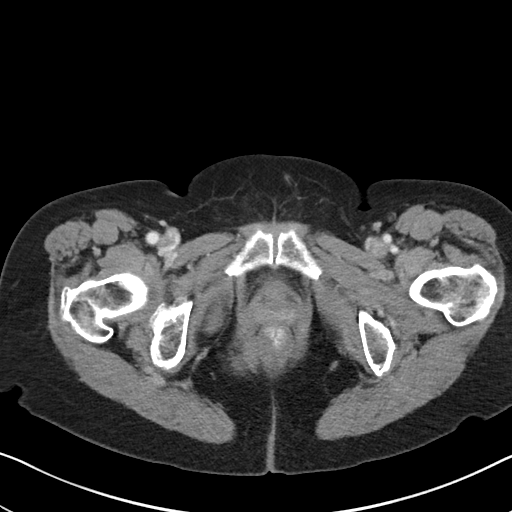
[im 17/83  soft-tissue]
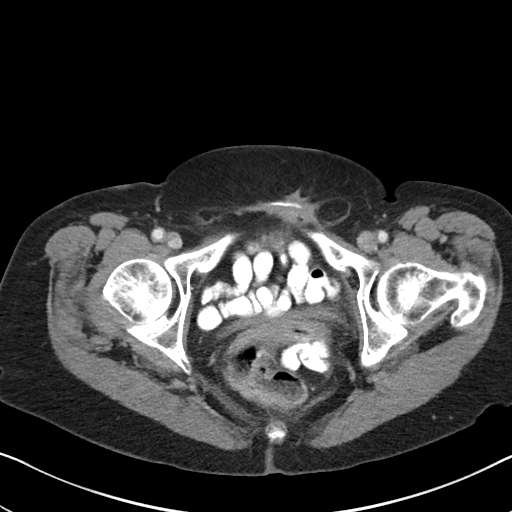
[im 25/83  soft-tissue]
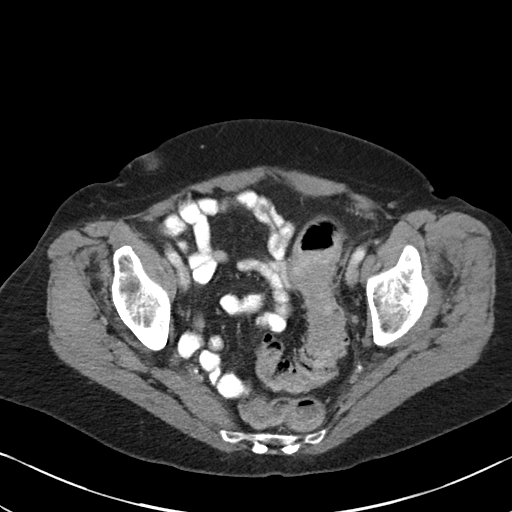
[im 29/83  soft-tissue]
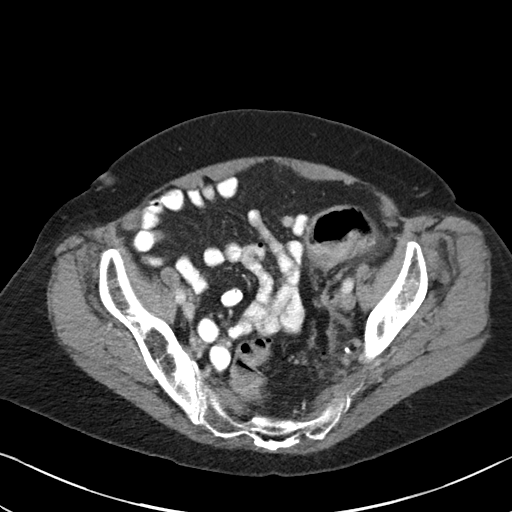
[im 37/83  soft-tissue]
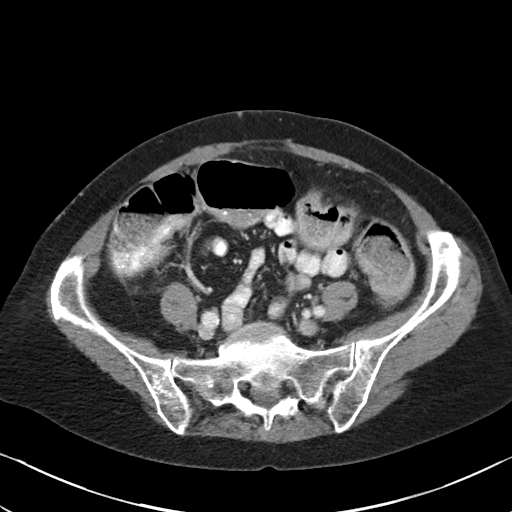
[im 42/83  soft-tissue]
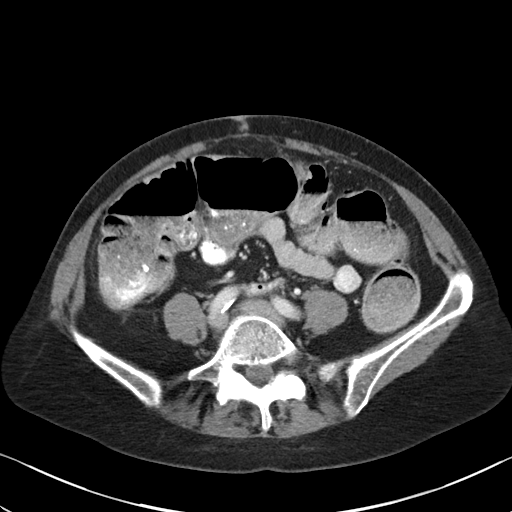
[im 46/83  soft-tissue]
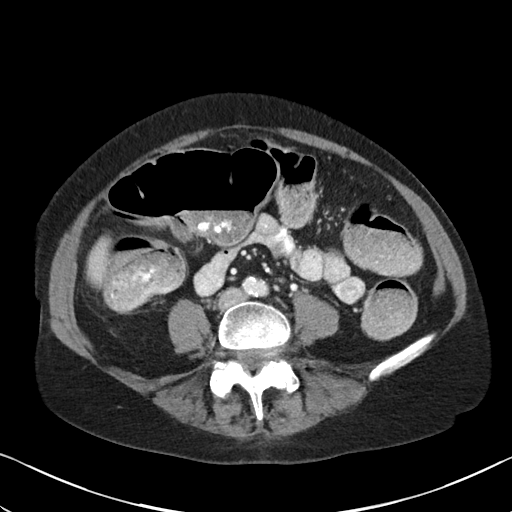
[im 54/83  soft-tissue]
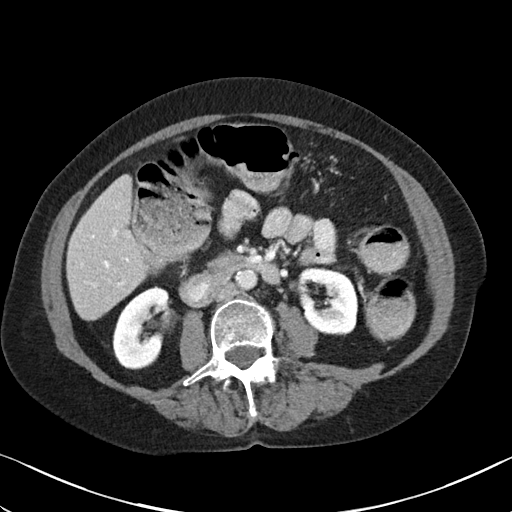
[im 54/83  bone]
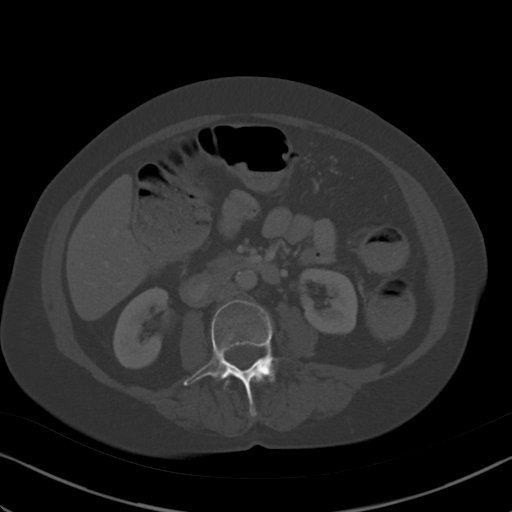
[im 58/83  soft-tissue]
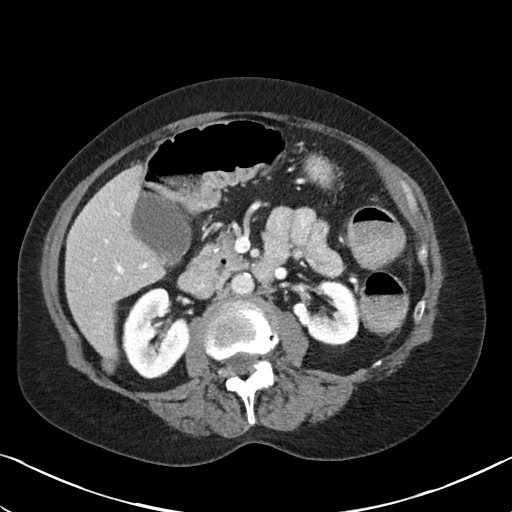
[im 66/83  soft-tissue]
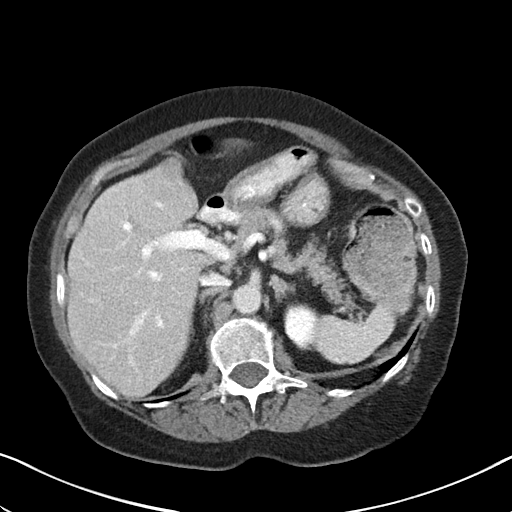
[im 70/83  soft-tissue]
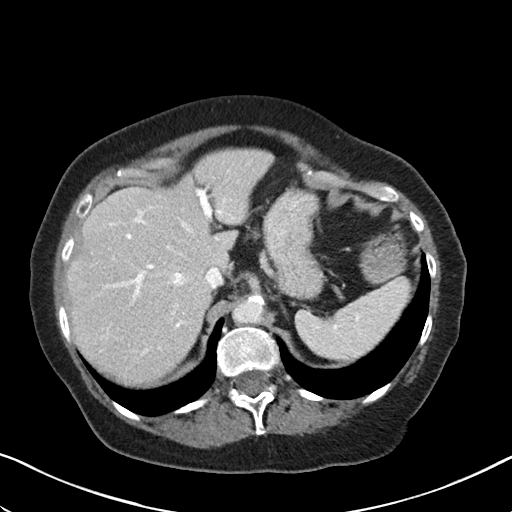
[im 78/83  soft-tissue]
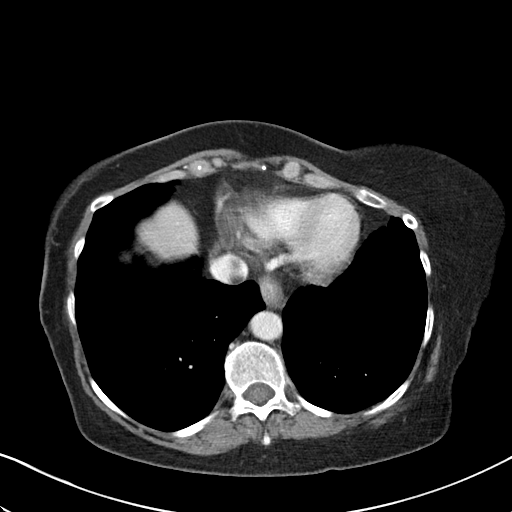

[Series 5: coronal soft tissue · coronal · 0.60mm/px · 3 of 75 slices shown]
[im 25/75  soft-tissue]
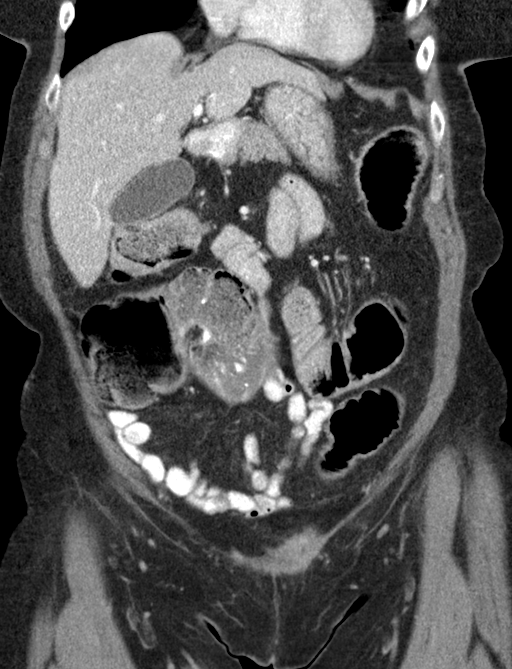
[im 33/75  soft-tissue]
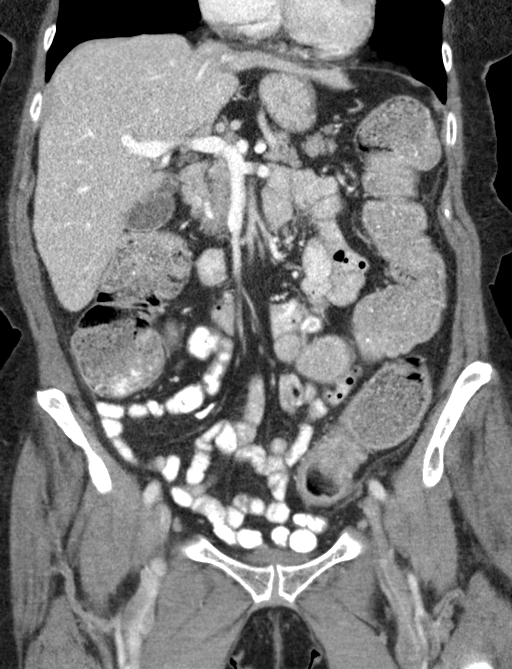
[im 42/75  soft-tissue]
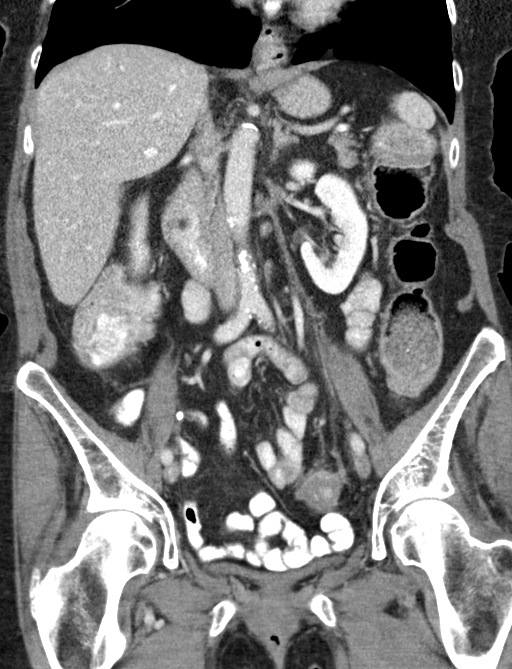

[16 of 46 positions shown; findings below may reference images not displayed]

FINDINGS: Lower chest:  No acute findings.

Hepatobiliary: No masses or other significant abnormality.
Gallbladder is unremarkable.

Pancreas: No mass, inflammatory changes, or other significant
abnormality.

Spleen: Within normal limits in size and appearance.

Adrenals/Urinary Tract: No masses identified. No evidence of
hydronephrosis. Duplicated left renal collecting system incidentally
noted.

Stomach/Bowel: No evidence of small bowel dilatation. Diffuse
colonic dilatation is seen with multiple air-fluid levels.
Diverticulosis is seen involving the sigmoid colon, without definite
acute pericolonic inflammatory changes. There is a relatively short
segment of wall thickening seen involving the mid sigmoid colon on
image 60/series 2. Although this could be due to mild diverticulitis
or muscular hypertrophy, obstructing colon carcinoma cannot
definitely be excluded.

Vascular/Lymphatic: No pathologically enlarged lymph nodes. No
evidence of abdominal aortic aneurysm.

Reproductive: Prior hysterectomy noted. Adnexal regions are
unremarkable in appearance. A

Other: None.

Musculoskeletal:  No suspicious bone lesions identified.
IMPRESSION: Findings consistent with distal colonic obstruction in the mid
sigmoid colon. There is evidence of diverticulosis in this region CT
on but there is also a short segment area of concentric wall
thickening without acute pericolonic inflammatory changes.
Differential diagnosis includes mild diverticulitis, muscular
hypertrophy, and an obstructing colon carcinoma cannot definitely be
excluded. Recommend clinical correlation, and consider sigmoidoscopy
for further evaluation.

No evidence of abscess or metastatic disease within the abdomen or
pelvis.

## 2017-05-14 ENCOUNTER — Encounter: Payer: Self-pay | Admitting: Internal Medicine

## 2017-05-14 ENCOUNTER — Other Ambulatory Visit: Payer: Self-pay | Admitting: Internal Medicine

## 2017-05-14 ENCOUNTER — Ambulatory Visit: Payer: Medicare Other | Admitting: Internal Medicine

## 2017-05-14 VITALS — Ht 65.0 in

## 2017-05-14 DIAGNOSIS — I480 Paroxysmal atrial fibrillation: Secondary | ICD-10-CM | POA: Diagnosis not present

## 2017-05-14 NOTE — Patient Instructions (Addendum)
Medication Instructions:  Your physician recommends that you continue on your current medications as directed. Please refer to the Current Medication list given to you today.  Labwork: None ordered  Testing/Procedures: None ordered  Follow-Up: Your physician recommends that you schedule a follow-up appointment in: 3 months with Dr. Rayann Heman.  * If you need a refill on your cardiac medications before your next appointment, please call your pharmacy.   *Please note that any paperwork needing to be filled out by the provider will need to be addressed at the front desk prior to seeing the provider. Please note that any FMLA, disability or other documents regarding health condition is subject to a $25.00 charge that must be received prior to completion of paperwork in the form of a money order or check.  Thank you for choosing [facility]!!

## 2017-05-14 NOTE — Telephone Encounter (Signed)
Eliquis 5mg  refill request received; pt is 72 yrs old, wt-63kg, Crea-0.66 on 03/03/17, last seen by Dr. Rayann Heman on today 05/14/17; will send in refill to requested pharmacy.

## 2017-05-14 NOTE — Progress Notes (Signed)
PCP: Janith Lima, MD Primary Cardiologist: Dr Burt Knack Primary EP: Dr Rayann Heman  Elizabeth Mcconnell is a 72 y.o. female who presents today for routine electrophysiology followup.  Her husband recently died suddenly of cerebral aneurysm.  She continues to mourn.  Since last being seen in our clinic, the patient reports doing very well.  Today, she denies symptoms of palpitations, chest pain, shortness of breath,  lower extremity edema, dizziness, presyncope, or syncope.  The patient is otherwise without complaint today.   Past Medical History:  Diagnosis Date  . Adenomatous polyp of colon 10/2013   f/u colo 10/2018  . Allergic rhinitis   . ALLERGIC RHINITIS   . ANXIETY    "situational" (02/14/2015)  . Arthritis    "neck, back, hands" (02/14/2015)  . Asthma   . Atrial fibrillation with RVR ([place])   . Atrial flutter ([place])    failed cardioversion 11/2012; s/p ablation 02-02-2013 by Dr Rayann Heman  . Chronic bronchitis ([place])    "I'll have it most years" (02/14/2015)  . Diverticulosis   . Endometriosis   . Essential hypertension 05/30/2014  . Hepatic steatosis   . Oral candidiasis   . Osteopenia 08/2015   T score -1.7 FRAX 14%/4.4% overall stable from prior DEXA.  [name] Pneumothorax on left 1996   left lower lung collapse s/p resection  . Small bowel obstruction ([place])   . Tobacco abuse    Past Surgical History:  Procedure Laterality Date  . ABDOMINAL HYSTERECTOMY    . APPENDECTOMY  1978  . ATRIAL FIBRILLATION ABLATION N/A 11/05/2016   Procedure: Atrial Fibrillation Ablation;  Surgeon: Thompson Grayer, MD;  Location: [facility];  Service: Cardiovascular;  Laterality: N/A;  . ATRIAL FLUTTER ABLATION N/A 02/02/2013   Procedure: ATRIAL FLUTTER ABLATION;  Surgeon: Coralyn Mark, MD;  Location: [facility];  Service: Cardiovascular;  Laterality: N/A;  . CARDIOVERSION N/A 11/27/2012   Procedure: CARDIOVERSION;  Surgeon: Thayer Headings, MD;  Location: [facility];  Service: Cardiovascular;   Laterality: N/A;  . [place]; 1978  . LAPAROSCOPIC PARTIAL COLECTOMY N/A 05/04/2015   Procedure: LAPAROSCOPIC LOW ANTERIOR RESECTION LAPAROSCOPIC LYSIS OF ADHESIONS, SPLENIC FLEXURE MOBILIZATION;  Surgeon: Michael Boston, MD;  Location: [facility];  Service: General;  Laterality: N/A;  . LUNG REMOVAL, PARTIAL Left 02/1965   LLL  . TOTAL ABDOMINAL HYSTERECTOMY W/ BILATERAL SALPINGOOPHORECTOMY  1984   Endometriosis    ROS- all systems are reviewed and negatives except as per HPI above  Current Outpatient Medications  Medication Sig Dispense Refill  . acetaminophen (TYLENOL) 650 MG CR tablet Take 1,300 mg by mouth 2 (two) times daily as needed for pain.     [name] ALPRAZolam (XANAX) 0.25 MG tablet TAKE 1 TABLET BY MOUTH 2 TIMES DAILY AS NEEDED FOR anxiety 60 tablet 2  . Calcium Carb-Cholecalciferol (CALCIUM 600+D) 600-800 MG-UNIT TABS Take 1 tablet by mouth daily.    . cyanocobalamin 2000 MCG tablet Take 1 tablet (2,000 mcg total) by mouth daily. 90 tablet 3  . dofetilide (TIKOSYN) 250 MCG capsule TAKE 1 CAPSULE BY MOUTH 2 TIMES DAILY 180 capsule 3  . ELIQUIS 5 MG TABS tablet TAKE 1 TABLET BY MOUTH 2 TIMES DAILY 180 tablet 3  . estradiol (VIVELLE-DOT) 0.05 MG/24HR patch Place 1 patch (0.05 mg total) onto the skin 2 (two) times a week. 8 patch 12  . fluticasone (FLONASE) 50 MCG/ACT nasal spray USE 2 SPRAYS IN EACH NOSTRIL 2 TIMES DAILY 16 g 5  .  fluticasone (FLOVENT HFA) 110 MCG/ACT inhaler Inhale 2 puffs into the lungs 2 (two) times daily. 1 Inhaler 5  . levalbuterol (XOPENEX HFA) 45 MCG/ACT inhaler inhale 1 TO 2 PUFFS BY MOUTH EVERY 4 HOURS AS NEEDED 15 g 3  . magnesium oxide (MAG-OX) 400 MG tablet Take 1 tablet (400 mg total) by mouth daily. 30 tablet 6  . montelukast (SINGULAIR) 10 MG tablet Take 1 tablet (10 mg total) by mouth daily. 30 tablet 2  . potassium chloride (K-DUR,KLOR-CON) 10 MEQ tablet Take 20 mEq by mouth daily.     No current facility-administered medications for this  visit.     Physical Exam: Vitals:   05/14/17 1234  Height: 5\' 5"  (1.651 m)    GEN- The patient is well appearing, alert and oriented x 3 today.   Head- normocephalic, atraumatic Eyes-  Sclera clear, conjunctiva pink Ears- hearing intact Oropharynx- clear Lungs- Clear to ausculation bilaterally, normal work of breathing Heart- Regular rate and rhythm, no murmurs, rubs or gallops, PMI not laterally displaced GI- soft, NT, ND, + BS Extremities- no clubbing, cyanosis, or edema  EKG tracing ordered today is personally reviewed and shows sinus rhythm 85 bpm, otherwise normal ekg  Assessment and Plan:  1. Paroxysmal atrial fibrillation Doing well s/p ablation chads2vasc score is 3.  Continue anticoagulation Stop tikosyn on return if no afib  2. Post termination pauses Resolved after ablation  Return to see me in 3 months  Thompson Grayer MD, [facility] 05/14/2017 12:57 PM

## 2017-05-26 ENCOUNTER — Other Ambulatory Visit: Payer: Self-pay | Admitting: Internal Medicine

## 2017-05-26 MED ORDER — MONTELUKAST SODIUM 10 MG PO TABS: 10.0000 mg | ORAL_TABLET | Freq: Every day | ORAL | 11 refills | Status: DC

## 2017-06-02 ENCOUNTER — Other Ambulatory Visit: Payer: Self-pay | Admitting: Internal Medicine

## 2017-06-24 ENCOUNTER — Encounter: Payer: Self-pay | Admitting: Gynecology

## 2017-06-30 ENCOUNTER — Other Ambulatory Visit: Payer: Self-pay | Admitting: Internal Medicine

## 2017-07-01 ENCOUNTER — Encounter: Payer: Self-pay | Admitting: Internal Medicine

## 2017-07-01 ENCOUNTER — Ambulatory Visit (INDEPENDENT_AMBULATORY_CARE_PROVIDER_SITE_OTHER): Payer: Medicare Other | Admitting: Internal Medicine

## 2017-07-01 VITALS — BP 132/82 | HR 96 | Ht 64.5 in | Wt 140.0 lb

## 2017-07-01 DIAGNOSIS — J4540 Moderate persistent asthma, uncomplicated: Secondary | ICD-10-CM | POA: Diagnosis not present

## 2017-07-01 DIAGNOSIS — F17210 Nicotine dependence, cigarettes, uncomplicated: Secondary | ICD-10-CM

## 2017-07-01 DIAGNOSIS — J4541 Moderate persistent asthma with (acute) exacerbation: Secondary | ICD-10-CM | POA: Diagnosis not present

## 2017-07-01 LAB — PULMONARY FUNCTION TEST
DL/VA % pred: 72 %
DL/VA: 3.52 ml/min/mmHg/L
DLCO unc % pred: 60 %
DLCO unc: 14.99 ml/min/mmHg
FEF 25-75 Post: 0.86 L/sec
FEF 25-75 Pre: 0.67 L/sec
FEF2575-%Change-Post: 29 %
FEF2575-%Pred-Post: 49 %
FEF2575-%Pred-Pre: 38 %
FEV1-%Change-Post: 9 %
FEV1-%Pred-Post: 77 %
FEV1-%Pred-Pre: 71 %
FEV1-Post: 1.71 L
FEV1-Pre: 1.56 L
FEV1FVC-%Change-Post: 7 %
FEV1FVC-%Pred-Pre: 76 %
FEV6-%Change-Post: 4 %
FEV6-%Pred-Post: 97 %
FEV6-%Pred-Pre: 93 %
FEV6-Post: 2.72 L
FEV6-Pre: 2.6 L
FEV6FVC-%Change-Post: 2 %
FEV6FVC-%Pred-Post: 102 %
FEV6FVC-%Pred-Pre: 99 %
FVC-%Change-Post: 1 %
FVC-%Pred-Post: 95 %
FVC-%Pred-Pre: 93 %
FVC-Post: 2.78 L
FVC-Pre: 2.74 L
Post FEV1/FVC ratio: 62 %
Post FEV6/FVC ratio: 98 %
Pre FEV1/FVC ratio: 57 %
Pre FEV6/FVC Ratio: 95 %
RV % pred: 89 %
RV: 2.04 L
TLC % pred: 94 %
TLC: 4.83 L

## 2017-07-01 NOTE — Progress Notes (Signed)
PFT done today. 

## 2017-07-01 NOTE — Assessment & Plan Note (Signed)
-  Spirometry  09/25/15 FEV1 1.58  (70%)  Ratio 55 on flovent 110 2bid  - PFT  09/25/15:  FEV1 1.58 L (70%) FEV1/FVC 0.55   73% DLCO uncorrected 58%    - PFT's  07/01/2017  FEV1 1.71 (77 % ) ratio 62  p 6 % improvement from saba p only flovent 110 prior to study with DLCO  60 % corrects to 72  % for alv volume   - 07/01/2017  After extensive coaching inhaler device  effectiveness =    90%   The distinction between mild copd and moderate asthma with a fixed component is moot as long as she  Remains asymptomatic on her best days and is not having more freq flares in which case may benefit from addition of lama if can't tol laba/ics.  For now no change in rx recommended/ f/u yearly and prn in interim    I had an extended discussion with the patient reviewing all relevant studies completed to date and  lasting 15 to 20 minutes of a 25 minute visit    Each maintenance medication was reviewed in detail including most importantly the difference between maintenance and prns and under what circumstances the prns are to be triggered using an action plan format that is not reflected in the computer generated alphabetically organized AVS.    Please see AVS for specific instructions unique to this visit that I personally wrote and verbalized to the the pt in detail and then reviewed with pt  by my nurse highlighting any  changes in therapy recommended at today's visit to their plan of care.

## 2017-07-01 NOTE — Patient Instructions (Addendum)
The key is to stop smoking completely before smoking completely stops you!   Schedule a follow up visit in 12  months but call sooner if needed

## 2017-07-01 NOTE — Assessment & Plan Note (Signed)

## 2017-07-01 NOTE — Progress Notes (Signed)
Subjective:    Patient ID: Elizabeth Mcconnell, female    DOB: 1945-11-12    MRN: [id number]  Brief patient profile:  58 yowf active smoker with "asthma all her life" previous pt of Dr Joya Gaskins and  Ashok Cordia with moderate asthma criteria vs copd GOLD II as of pfts 07/01/2017       History of Present Illness  03/25/2016 acute extended ov/Ellias Mcelreath re:  Chronic asthma maint flovent hfa ? Still smoking ?  Chief Complaint  Patient presents with  . Acute Visit    Pt c/o chest and sinus congestion for the past 2 wks. She feels very tired and states that she has chills "all the time"- no fever. She has had a prod cough with grey sputum.    baseline doing fine on  flovent hfa / singulair not needing any rescue with little flare of cough attributes to  Uri dec 2017 took doxy and all better until 2 weeks prior to OV with cough thick grey mucus x 2 weeks worse in ams with lots of sinus congestion and grey mucus, some chills but no fever / sweats - even with flare not using xopenex and not sure how much she can "maybe 2 pffs a day"  rec Doxycycline 100 mg twice daily x 10 days Prednisone 10 mg take  4 each am x 2 days,   2 each am x 2 days,  1 each am x 2 days and stop  Work on inhaler technique: For cough / congestion mucinex 1200 mg every 12 hours as needed and use the xopenex hfa more regularly to improve the mucus flow     02/19/2017 acute extended ov/Schylar Allard re: acute cough  Chief Complaint  Patient presents with  . Acute Visit    Pt c/o cough for the past 3 days. She also c/o tightness in her throat and upper chest. She has had fatigue and chills also.   maint on flovent 2bid and rarely xopenex only a few times a day even when flare for cough or chest tightness assoc with nasal congestion but no sob rec Doxycycline 100 mg twice daily x 10 days Prednisone 10 mg take  4 each am x 2 days,   2 each am x 2 days,  1 each am x 2 days and stop  Work on inhaler technique:   For cough / congestion mucinex  Or  mucinex dm 1200 mg every 12 hours as needed and use the xopenex hfa more regularly to improve the mucus flow  And use Tylenol #3 as needed when can afford to be sleepy Try prilosec otc 20mg   Take 30-60 min before first meal of the day and Pepcid ac (famotidine) 20 mg one @  bedtime until cough is completely gone for at least a week without the need for cough suppression GERD  Diet      04/02/2017  f/u ov/Nazim Kadlec re: moderate asthma/ cough  Chief Complaint  Patient presents with  . Follow-up    Cough had improved, but then worsened again 4 days ago. She feels some tightness in her throat. Cough has been prod with thick, grey sputum.  She is also having some SOB since cough started back.  She has used her xopenex for rescue a few times since she started feeling bad again.   was all better then acutely worse  since 03/30/17 with nasal congestion/ sneezing then cough/ wheeze and sob  Dyspnea:  Not needing xopenex at all at baseline / only  twice daily since acutely ill and not sob at rest p xopenex prn Cough: grey mucus/ worse at hs  Sleep: after xanax does fine/ did not do well with Tyl #3  rec Doxycycline 100 mg twice daily x 10 days Prednisone 10 mg take  4 each am x 2 days,   2 each am x 2 days,  1 each am x 2 days and stop  For cough / congestion mucinex  Or mucinex dm 1200 mg every 12 hours as needed and use the xopenex hfa more regularly to improve the mucus flow   - up to 2 puffs every 4 hours as needed  Try prilosec otc 20mg   Take 30-60 min before first meal of the day and Pepcid ac (famotidine) 20 mg one @  bedtime until cough is completely gone for at least a week without the need for cough suppression    07/01/2017  f/u ov/Nickholas [place] re  moderate asthma  Lifelong maint on flovent 110 one bid  Chief Complaint  Patient presents with  . Follow-up    PFT's done today. Breathing is doing well and not coughing at this time. She rarely uses her xopenex.   Dyspnea:  MMRC1 = can walk nl pace, flat  grade, can't hurry or go uphills or steps s sob   Cough: gone Sleep: flat  SABA use:  None   No obvious day to day or daytime variability or assoc excess/ purulent sputum or mucus plugs or hemoptysis or cp or chest tightness, subjective wheeze or overt sinus or hb symptoms. No unusual exposure hx or h/o childhood pna or knowledge of premature birth.  Sleeping  Flat without nocturnal  or early am exacerbation  of respiratory  c/o's or need for noct saba. Also denies any obvious fluctuation of symptoms with weather or environmental changes or other aggravating or alleviating factors except as outlined above   Current Allergies, Complete Past Medical History, Past Surgical History, Family History, and Social History were reviewed in [employer] record.  ROS  The following are not active complaints unless bolded Hoarseness, sore throat, dysphagia, dental problems, itching, sneezing,  nasal congestion or discharge of excess mucus or purulent secretions, ear ache,   fever, chills, sweats, unintended wt loss or wt gain, classically pleuritic or exertional cp,  orthopnea pnd or arm/hand swelling  or leg swelling, presyncope, palpitations, abdominal pain, anorexia, nausea, vomiting, diarrhea  or change in bowel habits or change in bladder habits, change in stools or change in urine, dysuria, hematuria,  rash, arthralgias, visual complaints, headache, numbness, weakness or ataxia or problems with walking or coordination,  change in mood related to husband's sudden death  or  memory.        Current Meds  Medication Sig  . acetaminophen (TYLENOL) 650 MG CR tablet Take 1,300 mg by mouth 2 (two) times daily as needed for pain.   [name] ALPRAZolam (XANAX) 0.25 MG tablet TAKE 1 TABLET BY MOUTH 2 TIMES DAILY FOR anxiety  . Calcium Carb-Cholecalciferol (CALCIUM 600+D) 600-800 MG-UNIT TABS Take 1 tablet by mouth daily.  . cyanocobalamin 2000 MCG tablet Take 1 tablet (2,000 mcg total) by mouth  daily.  [name] dofetilide (TIKOSYN) 250 MCG capsule TAKE 1 CAPSULE BY MOUTH 2 TIMES DAILY  . ELIQUIS 5 MG TABS tablet TAKE 1 TABLET BY MOUTH 2 TIMES DAILY  . estradiol (VIVELLE-DOT) 0.05 MG/24HR patch Place 1 patch (0.05 mg total) onto the skin 2 (two) times a week.  . fluticasone (FLONASE)  50 MCG/ACT nasal spray USE 2 SPRAYS IN EACH NOSTRIL 2 TIMES DAILY  . fluticasone (FLOVENT HFA) 110 MCG/ACT inhaler Inhale 2 puffs into the lungs 2 (two) times daily.  [name] levalbuterol (XOPENEX HFA) 45 MCG/ACT inhaler inhale 1 TO 2 PUFFS BY MOUTH EVERY 4 HOURS AS NEEDED  . magnesium oxide (MAG-OX) 400 MG tablet Take 1 tablet (400 mg total) by mouth daily.  . montelukast (SINGULAIR) 10 MG tablet Take 1 tablet (10 mg total) by mouth daily.  . potassium chloride (K-DUR,KLOR-CON) 10 MEQ tablet Take 20 mEq by mouth daily.                            Objective:   Physical Exam  amb wf nad  07/01/2017        140  02/19/2017      147   03/25/16 156 lb 12.8 oz (71.1 kg)  02/12/16 155 lb 6.4 oz (70.5 kg)  11/28/15 150 lb 12 oz (68.4 kg)      Vital signs reviewed - Note on arrival 02 sats  98% on RA   HEENT: nl dentition, turbinates bilaterally, and oropharynx. Nl external ear canals without cough reflex   NECK :  without JVD/Nodes/TM/ nl carotid upstrokes bilaterally   LUNGS: no acc muscle use,  Nl contour chest which is clear to A and P bilaterally without cough on insp or exp maneuvers   CV:  RRR  no s3 or murmur or increase in P2, and no edema   ABD:  soft and nontender with nl inspiratory excursion in the supine position. No bruits or organomegaly appreciated, bowel sounds nl  MS:  Nl gait/ ext warm without deformities, calf tenderness, cyanosis or clubbing No obvious joint restrictions   SKIN: warm and dry without lesions    NEURO:  alert, approp, nl sensorium with  no motor or cerebellar deficits apparent.                   Assessment & Plan:

## 2017-07-03 LAB — HM MAMMOGRAPHY

## 2017-07-10 ENCOUNTER — Encounter: Payer: Self-pay | Admitting: Internal Medicine

## 2017-07-10 NOTE — Progress Notes (Signed)
Outside notes received. Information abstracted. Notes sent to scan.  

## 2017-07-29 ENCOUNTER — Other Ambulatory Visit: Payer: Self-pay | Admitting: Acute Care

## 2017-08-13 ENCOUNTER — Encounter: Payer: Self-pay | Admitting: Internal Medicine

## 2017-08-13 ENCOUNTER — Ambulatory Visit: Payer: Medicare Other | Admitting: Internal Medicine

## 2017-08-13 VITALS — BP 124/72 | HR 89 | Ht 64.5 in | Wt 139.0 lb

## 2017-08-13 DIAGNOSIS — I480 Paroxysmal atrial fibrillation: Secondary | ICD-10-CM

## 2017-08-13 NOTE — Progress Notes (Signed)
PCP: Janith Lima, MD Primary Cardiologist: Dr Burt Knack Primary EP: Dr Rayann Heman  Elizabeth Mcconnell is a 72 y.o. female who presents today for routine electrophysiology followup.  Since last being seen in our clinic, the patient reports doing very well.  Today, she denies symptoms of palpitations, chest pain, shortness of breath,  lower extremity edema, dizziness, presyncope, or syncope.  The patient is otherwise without complaint today.   Past Medical History:  Diagnosis Date  . Adenomatous polyp of colon 10/2013   f/u colo 10/2018  . Allergic rhinitis   . ALLERGIC RHINITIS   . ANXIETY    "situational" (02/14/2015)  . Arthritis    "neck, back, hands" (02/14/2015)  . Asthma   . Atrial fibrillation with RVR ([place])   . Atrial flutter ([place])    failed cardioversion 11/2012; s/p ablation 02-02-2013 by Dr Rayann Heman  . Chronic bronchitis ([place])    "I'll have it most years" (02/14/2015)  . Diverticulosis   . Endometriosis   . Essential hypertension 05/30/2014  . Hepatic steatosis   . Oral candidiasis   . Osteopenia 08/2015   T score -1.7 FRAX 14%/4.4% overall stable from prior DEXA.  [name] Pneumothorax on left 1996   left lower lung collapse s/p resection  . Small bowel obstruction ([place])   . Tobacco abuse    Past Surgical History:  Procedure Laterality Date  . ABDOMINAL HYSTERECTOMY    . APPENDECTOMY  1978  . ATRIAL FIBRILLATION ABLATION N/A 11/05/2016   Procedure: Atrial Fibrillation Ablation;  Surgeon: Thompson Grayer, MD;  Location: [facility];  Service: Cardiovascular;  Laterality: N/A;  . ATRIAL FLUTTER ABLATION N/A 02/02/2013   Procedure: ATRIAL FLUTTER ABLATION;  Surgeon: Coralyn Mark, MD;  Location: [facility];  Service: Cardiovascular;  Laterality: N/A;  . CARDIOVERSION N/A 11/27/2012   Procedure: CARDIOVERSION;  Surgeon: Thayer Headings, MD;  Location: [facility];  Service: Cardiovascular;  Laterality: N/A;  . [place]; 1978  . LAPAROSCOPIC PARTIAL COLECTOMY  N/A 05/04/2015   Procedure: LAPAROSCOPIC LOW ANTERIOR RESECTION LAPAROSCOPIC LYSIS OF ADHESIONS, SPLENIC FLEXURE MOBILIZATION;  Surgeon: Michael Boston, MD;  Location: [facility];  Service: General;  Laterality: N/A;  . LUNG REMOVAL, PARTIAL Left 02/1965   LLL  . TOTAL ABDOMINAL HYSTERECTOMY W/ BILATERAL SALPINGOOPHORECTOMY  1984   Endometriosis    ROS- all systems are reviewed and negatives except as per HPI above  Current Outpatient Medications  Medication Sig Dispense Refill  . acetaminophen (TYLENOL) 650 MG CR tablet Take 1,300 mg by mouth 2 (two) times daily as needed for pain.     [name] ALPRAZolam (XANAX) 0.25 MG tablet TAKE 1 TABLET BY MOUTH 2 TIMES DAILY FOR anxiety 60 tablet 1  . Calcium Carb-Cholecalciferol (CALCIUM 600+D) 600-800 MG-UNIT TABS Take 1 tablet by mouth daily.    . cyanocobalamin 2000 MCG tablet Take 1 tablet (2,000 mcg total) by mouth daily. 90 tablet 3  . dofetilide (TIKOSYN) 250 MCG capsule TAKE 1 CAPSULE BY MOUTH 2 TIMES DAILY 180 capsule 3  . ELIQUIS 5 MG TABS tablet TAKE 1 TABLET BY MOUTH 2 TIMES DAILY 180 tablet 2  . estradiol (VIVELLE-DOT) 0.05 MG/24HR patch Place 1 patch (0.05 mg total) onto the skin 2 (two) times a week. 8 patch 12  . FLOVENT HFA 110 MCG/ACT inhaler INHALE 2 PUFFS INTO THE LUNGS 2 TIMES DAILY 12 g 5  . fluticasone (FLONASE) 50 MCG/ACT nasal spray USE 2 SPRAYS IN EACH NOSTRIL 2 TIMES DAILY 16  g 5  . levalbuterol (XOPENEX HFA) 45 MCG/ACT inhaler inhale 1 TO 2 PUFFS BY MOUTH EVERY 4 HOURS AS NEEDED 15 g 3  . magnesium oxide (MAG-OX) 400 MG tablet Take 1 tablet (400 mg total) by mouth daily. 30 tablet 6  . montelukast (SINGULAIR) 10 MG tablet Take 1 tablet (10 mg total) by mouth daily. 30 tablet 11  . potassium chloride (K-DUR,KLOR-CON) 10 MEQ tablet Take 20 mEq by mouth daily.     No current facility-administered medications for this visit.     Physical Exam: Vitals:   08/13/17 1344  BP: 124/72  Pulse: 89  Weight: 139 lb (63 kg)  Height: 5' 4.5"  (1.638 m)    GEN- The patient is well appearing, alert and oriented x 3 today.   Head- normocephalic, atraumatic Eyes-  Sclera clear, conjunctiva pink Ears- hearing intact Oropharynx- clear Lungs- Clear to ausculation bilaterally, normal work of breathing Heart- Regular rate and rhythm, no murmurs, rubs or gallops, PMI not laterally displaced GI- soft, NT, ND, + BS Extremities- no clubbing, cyanosis, or edema  Wt Readings from Last 3 Encounters:  08/13/17 139 lb (63 kg)  07/01/17 140 lb (63.5 kg)  04/02/17 139 lb (63 kg)    EKG tracing ordered today is personally reviewed and shows sinus rhythm   Assessment and Plan:  1. Paroxysmal atrial fibrillation Doing well s/p ablation.  She remains afraid to stop tikosyn, though she has had no afib.  She continues to grieve the death of her spouse.  I think that it is very reasonable to continue tikosyn Bmet, mg today chads2vasc score is 3.  Continue anticoagulation  2. Post termination pauses Resolved with sinus rhythm  Return in 4 months  Thompson Grayer MD, [facility] 08/13/2017 2:07 PM

## 2017-08-13 NOTE — Patient Instructions (Addendum)
Medication Instructions:  Your physician recommends that you continue on your current medications as directed. Please refer to the Current Medication list given to you today.   Labwork: TODAY: BMET, MG  Testing/Procedures: None ordered  Follow-Up: Your physician recommends that you schedule a follow-up appointment in: 3 months with Dr. Rayann Heman    Any Other Special Instructions Will Be Listed Below (If Applicable).     If you need a refill on your cardiac medications before your next appointment, please call your pharmacy.

## 2017-08-14 ENCOUNTER — Encounter: Payer: Medicare Other | Admitting: Gynecology

## 2017-08-14 LAB — BASIC METABOLIC PANEL
BUN/Creatinine Ratio: 14 (ref 12–28)
BUN: 10 mg/dL (ref 8–27)
CO2: 24 mmol/L (ref 20–29)
Calcium: 10.1 mg/dL (ref 8.7–10.3)
Chloride: 102 mmol/L (ref 96–106)
Creatinine, Ser: 0.7 mg/dL (ref 0.57–1.00)
GFR calc Af Amer: 99 mL/min/{1.73_m2} (ref >59)
GFR calc non Af Amer: 86 mL/min/{1.73_m2} (ref >59)
Glucose: 97 mg/dL (ref 65–99)
Potassium: 4.2 mmol/L (ref 3.5–5.2)
Sodium: 141 mmol/L (ref 134–144)

## 2017-08-14 LAB — MAGNESIUM: Magnesium: 2.3 mg/dL (ref 1.6–2.3)

## 2017-08-22 ENCOUNTER — Ambulatory Visit (INDEPENDENT_AMBULATORY_CARE_PROVIDER_SITE_OTHER): Payer: Medicare Other | Admitting: *Deleted

## 2017-08-22 VITALS — BP 128/74 | HR 81 | Resp 18 | Ht 65.0 in | Wt 139.0 lb

## 2017-08-22 DIAGNOSIS — Z0000 Encounter for general adult medical examination without abnormal findings: Secondary | ICD-10-CM

## 2017-08-22 NOTE — Progress Notes (Signed)
Subjective:   Elizabeth Mcconnell is a 72 y.o. female who presents for Medicare Annual (Subsequent) preventive examination.  Review of Systems:  No ROS.  Medicare Wellness Visit. Additional risk factors are reflected in the social history.  Cardiac Risk Factors include: advanced age (>13men, >15 women) Sleep patterns: gets up 1 times nightly to void and sleeps 6-7 hours nightly.    Home Safety/Smoke Alarms: Feels safe in home. Smoke alarms in place.  Living environment; residence and Firearm Safety: 2-story house, no firearms Lives alone, no needs for DME, good support system. Seat Belt Safety/Bike Helmet: Wears seat belt.     Objective:     Vitals: BP 128/74   Pulse 81   Resp 18   Ht 5\' 5"  (1.651 m)   Wt 139 lb (63 kg)   SpO2 98%   BMI 23.13 kg/m   Body mass index is 23.13 kg/m.  Advanced Directives 08/22/2017 11/13/2016 11/11/2016 11/05/2016 05/10/2016 05/01/2015 02/14/2015  Does Patient Have a Medical Advance Directive? Yes - Yes Yes Yes Yes Yes  Type of Paramedic of [place];Living will [place];Living will Healthcare Power of [place] of [place];Living will Living will;Healthcare Power of [place]  Does patient want to make changes to medical advance directive? - No - Patient declined - No - Patient declined No - Patient declined No - Patient declined No - Patient declined  Copy of [place] in Chart? No - copy requested No - copy requested - No - copy requested No - copy requested No - copy requested No - copy requested  Would patient like information on creating a medical advance directive? - - - - - - -  Pre-existing out of facility DNR order (yellow form or pink MOST form) - - - - - - -    Tobacco Social History   Tobacco Use  Smoking Status Current Some Day Smoker  . Packs/day: 0.25  . Years: 43.00  . Pack years: 10.75  .  Types: Cigarettes  Smokeless Tobacco Never Used  Tobacco Comment   smokes only 1-2 cigarettes intermittently      Ready to quit: Not Answered Counseling given: Not Answered Comment: smokes only 1-2 cigarettes intermittently    Past Medical History:  Diagnosis Date  . Adenomatous polyp of colon 10/2013   f/u colo 10/2018  . Allergic rhinitis   . ALLERGIC RHINITIS   . ANXIETY    "situational" (02/14/2015)  . Arthritis    "neck, back, hands" (02/14/2015)  . Asthma   . Atrial fibrillation with RVR ([place])   . Atrial flutter ([place])    failed cardioversion 11/2012; s/p ablation 02-02-2013 by Dr Rayann Heman  . Chronic bronchitis ([place])    "I'll have it most years" (02/14/2015)  . Diverticulosis   . Endometriosis   . Essential hypertension 05/30/2014  . Hepatic steatosis   . Oral candidiasis   . Osteopenia 08/2015   T score -1.7 FRAX 14%/4.4% overall stable from prior DEXA.  [name] Pneumothorax on left 1996   left lower lung collapse s/p resection  . Small bowel obstruction ([place])   . Tobacco abuse    Past Surgical History:  Procedure Laterality Date  . ABDOMINAL HYSTERECTOMY    . APPENDECTOMY  1978  . ATRIAL FIBRILLATION ABLATION N/A 11/05/2016   Procedure: Atrial Fibrillation Ablation;  Surgeon: Thompson Grayer, MD;  Location: [facility];  Service: Cardiovascular;  Laterality: N/A;  .  ATRIAL FLUTTER ABLATION N/A 02/02/2013   Procedure: ATRIAL FLUTTER ABLATION;  Surgeon: Coralyn Mark, MD;  Location: [facility];  Service: Cardiovascular;  Laterality: N/A;  . CARDIOVERSION N/A 11/27/2012   Procedure: CARDIOVERSION;  Surgeon: Thayer Headings, MD;  Location: [facility];  Service: Cardiovascular;  Laterality: N/A;  . [place]; 1978  . LAPAROSCOPIC PARTIAL COLECTOMY N/A 05/04/2015   Procedure: LAPAROSCOPIC LOW ANTERIOR RESECTION LAPAROSCOPIC LYSIS OF ADHESIONS, SPLENIC FLEXURE MOBILIZATION;  Surgeon: Michael Boston, MD;  Location: [facility];  Service: General;  Laterality: N/A;  . LUNG  REMOVAL, PARTIAL Left 02/1965   LLL  . TOTAL ABDOMINAL HYSTERECTOMY W/ BILATERAL SALPINGOOPHORECTOMY  1984   Endometriosis   Family History  Problem Relation Age of Onset  . Asthma Maternal Grandfather   . Stroke Father 38  . Hypertension Father   . Heart disease Father   . Diabetes Mother   . Asthma Child   . Colon cancer Neg Hx   . Rectal cancer Neg Hx   . Stomach cancer Neg Hx    Social History   Socioeconomic History  . Marital status: Widowed    Spouse name: Not on file  . Number of children: 2  . Years of education: Not on file  . Highest education level: Not on file  Occupational History  . 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 Financial resource strain: Not hard at all  . Food insecurity:    Worry: Never true    Inability: Never true  . Transportation needs:    Medical: No    Non-medical: No  Tobacco Use  . Smoking status: Current Some Day Smoker    Packs/day: 0.25    Years: 43.00    Pack years: 10.75    Types: Cigarettes  . Smokeless tobacco: Never Used  . Tobacco comment: smokes only 1-2 cigarettes intermittently   Substance and Sexual Activity  . Alcohol use: Yes    Alcohol/week: 8.4 oz    Types: 14 Standard drinks or equivalent per week    Comment: daily Obie Silos  . Drug use: No  . Sexual activity: Not Currently    Birth control/protection: Surgical, Post-menopausal    Comment: HYST-1st intercourse 72 yo-Fewer than 5 partners  Lifestyle  . Physical activity:    Days per week: 5 days    Minutes per session: 50 min  . Stress: Rather much  Relationships  . Social connections:    Talks on phone: More than three times a week    Gets together: More than three times a week    Attends religious service: More than 4 times per year    Active member of club or organization: Yes    Attends meetings of clubs or organizations: More than 4 times per year    Relationship status: Widowed    Other Topics Concern  . Not on file  Social History Narrative   Lives with spouse;    2 children      [facility]:   She is from [facility]. 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 Previously has attended business school. Previously did office work and Programmer, applications at [employer]. She has also worked for [employer]. She does have a cat currently. She has indoor plants. No mold exposure.    Outpatient Encounter Medications as of 08/22/2017  Medication Sig  . acetaminophen (TYLENOL)  650 MG CR tablet Take 1,300 mg by mouth 2 (two) times daily as needed for pain.   [name] ALPRAZolam (XANAX) 0.25 MG tablet TAKE 1 TABLET BY MOUTH 2 TIMES DAILY FOR anxiety  . Calcium Carb-Cholecalciferol (CALCIUM 600+D) 600-800 MG-UNIT TABS Take 1 tablet by mouth daily.  . cyanocobalamin 2000 MCG tablet Take 1 tablet (2,000 mcg total) by mouth daily.  [name] dofetilide (TIKOSYN) 250 MCG capsule TAKE 1 CAPSULE BY MOUTH 2 TIMES DAILY  . ELIQUIS 5 MG TABS tablet TAKE 1 TABLET BY MOUTH 2 TIMES DAILY  . estradiol (VIVELLE-DOT) 0.05 MG/24HR patch Place 1 patch (0.05 mg total) onto the skin 2 (two) times a week.  [name] FLOVENT HFA 110 MCG/ACT inhaler INHALE 2 PUFFS INTO THE LUNGS 2 TIMES DAILY  . fluticasone (FLONASE) 50 MCG/ACT nasal spray USE 2 SPRAYS IN EACH NOSTRIL 2 TIMES DAILY  . levalbuterol (XOPENEX HFA) 45 MCG/ACT inhaler inhale 1 TO 2 PUFFS BY MOUTH EVERY 4 HOURS AS NEEDED  . magnesium oxide (MAG-OX) 400 MG tablet Take 1 tablet (400 mg total) by mouth daily.  . montelukast (SINGULAIR) 10 MG tablet Take 1 tablet (10 mg total) by mouth daily.  . potassium chloride (K-DUR,KLOR-CON) 10 MEQ tablet Take 20 mEq by mouth daily.   No facility-administered encounter medications on file as of 08/22/2017.     Activities of Daily Living In your present state of health, do you have any difficulty performing the following activities: 08/22/2017 11/12/2016  Hearing? N N  Vision? N N  Comment - wearing  eyeglasses,contacts  Difficulty concentrating or making decisions? N N  Walking or climbing stairs? N N  Dressing or bathing? N N  Doing errands, shopping? N N  Preparing Food and eating ? N -  Using the Toilet? N -  In the past six months, have you accidently leaked urine? N -  Do you have problems with loss of bowel control? N -  Managing your Medications? N -  Managing your Finances? N -  Housekeeping or managing your Housekeeping? N -  Some recent data might be hidden    Patient Care Team: Janith Lima, MD as PCP - General (Internal Medicine) Elsie Stain, MD (Pulmonary Disease) Thompson Grayer, MD (Cardiology) Sherren Mocha, MD (Cardiology) Phineas Real Belinda Block, MD (Gynecology) Erline Levine, MD (Neurosurgery) Lafayette Dragon, MD (Inactive) (Gastroenterology)    Assessment:   This is a routine wellness examination for Elizabeth Mcconnell. Physical assessment deferred to PCP.   Exercise Activities and Dietary recommendations Current Exercise Habits: Structured exercise class;Home exercise routine, Type of exercise: walking(water aerobics), Time (Minutes): 50, Frequency (Times/Week): 4, Weekly Exercise (Minutes/Week): 200, Intensity: Mild, Exercise limited by: None identified  Diet (meal preparation, eat out, water intake, caffeinated beverages, dairy products, fruits and vegetables): in general, a "healthy" diet  , well balanced   Reviewed heart healthy and diabetic diet Encouraged patient to increase daily water and fluid intake.     Goals      Patient Stated   . patient centered (pt-stated)     Wants to keep moving; staying active Continue with pool work      Other   . Patient Stated     In the afternoon take time for me to do my crafts and get prepared for the upcoming craft shows.        Fall Risk Fall Risk  08/22/2017 09/30/2014 06/13/2014 06/02/2013  Falls in the past year? No No No No    Depression Screen PHQ 2/9  Scores 08/22/2017 09/30/2014 06/02/2013  PHQ - 2  Score 2 0 0  PHQ- 9 Score 3 - -     Cognitive Function       Ad8 score reviewed for issues:  Issues making decisions: no  Less interest in hobbies / activities: no  Repeats questions, stories (family complaining): no  Trouble using ordinary gadgets (microwave, computer, phone):no  Forgets the month or year: no  Mismanaging finances: no  Remembering appts: no  Daily problems with thinking and/or memory: no Ad8 score is= 0  Immunization History  Administered Date(s) Administered  . DTaP 04/19/2012  . Influenza Split 12/03/2010  . Influenza Whole 12/02/2008, 12/20/2011  . Influenza, High Dose Seasonal PF 11/18/2015, 12/11/2016  . Influenza,inj,Quad PF,6+ Mos 11/17/2012  . Influenza-Unspecified 01/03/2014, 11/11/2014  . Pneumococcal Conjugate-13 06/13/2014  . Pneumococcal Polysaccharide-23 12/03/2007, 04/19/2012  . Tdap 04/07/2012  . Zoster 03/04/1998    Screening Tests Health Maintenance  Topic Date Due  . Hepatitis C Screening  01-21-1946  . PAP SMEAR  06/13/2011  . INFLUENZA VACCINE  10/02/2017  . COLONOSCOPY  10/28/2018  . MAMMOGRAM  07/04/2019  . TETANUS/TDAP  04/07/2022  . DEXA SCAN  Completed  . PNA vac Low Risk Adult  Completed      Plan:     Continue doing brain stimulating activities (puzzles, reading, adult coloring books, staying active) to keep memory sharp.   Continue to eat heart healthy diet (full of fruits, vegetables, whole grains, lean protein, water--limit salt, fat, and sugar intake) and increase physical activity as tolerated.  I have personally reviewed and noted the following in the patient's chart:   . Medical and social history . Use of alcohol, tobacco or illicit drugs  . Current medications and supplements . Functional ability and status . Nutritional status . Physical activity . Advanced directives . List of other physicians . Vitals . Screenings to include cognitive, depression, and falls . Referrals and  appointments  In addition, I have reviewed and discussed with patient certain preventive protocols, quality metrics, and best practice recommendations. A written personalized care plan for preventive services as well as general preventive health recommendations were provided to patient.     Michiel Cowboy, RN  08/22/2017

## 2017-08-22 NOTE — Patient Instructions (Addendum)
Continue doing brain stimulating activities (puzzles, reading, adult coloring books, staying active) to keep memory sharp.   Continue to eat heart healthy diet (full of fruits, vegetables, whole grains, lean protein, water--limit salt, fat, and sugar intake) and increase physical activity as tolerated.  [employer] Line at 713-862-8173  1-800-QUIT-NOW (541)546-0856). http://bell-fernandez.com/  [name] , Thank you for taking time to come for your Medicare Wellness Visit. I appreciate your ongoing commitment to your health goals. Please review the following plan we discussed and let me know if I can assist you in the future.   These are the goals we discussed: Goals      Patient Stated   . patient centered (pt-stated)     Wants to keep moving; staying active Continue with pool work      Other   . Patient Stated     In the afternoon take time for me to do my crafts and get prepared for the upcoming craft shows.        This is a list of the screening recommended for you and due dates:  Health Maintenance  Topic Date Due  .  Hepatitis C: One time screening is recommended by Center for Disease Control  (CDC) for  adults born from [birth year] through 1967.   74/20/1947  . Pap Smear  06/13/2011  . Flu Shot  10/02/2017  . Colon Cancer Screening  10/28/2018  . Mammogram  07/04/2019  . Tetanus Vaccine  04/07/2022  . DEXA scan (bone density measurement)  Completed  . Pneumonia vaccines  Completed    Health Maintenance, Female Adopting a healthy lifestyle and getting preventive care can go a long way to promote health and wellness. Talk with your health care provider about what schedule of regular examinations is right for you. This is a good chance for you to check in with your provider about disease prevention and staying healthy. In between checkups, there are plenty of things you can do on  your own. Experts have done a lot of research about which lifestyle changes and preventive measures are most likely to keep you healthy. Ask your health care provider for more information. Weight and diet Eat a healthy diet  Be sure to include plenty of vegetables, fruits, low-fat dairy products, and lean protein.  Do not eat a lot of foods high in solid fats, added sugars, or salt.  Get regular exercise. This is one of the most important things you can do for your health. ? Most adults should exercise for at least 150 minutes each week. The exercise should increase your heart rate and make you sweat (moderate-intensity exercise). ? Most adults should also do strengthening exercises at least twice a week. This is in addition to the moderate-intensity exercise.  Maintain a healthy weight  Body mass index (BMI) is a measurement that can be used to identify possible weight problems. It estimates body fat based on height and weight. Your health care provider can help determine your BMI and help you achieve or maintain a healthy weight.  For females 74 years of age and older: ? A BMI below 18.5 is considered underweight. ? A BMI of 18.5 to 24.9 is normal. ? A BMI of 25 to 29.9 is considered overweight. ? A BMI of 30 and above is considered obese.  Watch levels of cholesterol and blood lipids  You should start having your blood tested for lipids and cholesterol at 74 years of age, then have this test every 5 years.  You may need to have your cholesterol levels checked more often if: ? Your lipid or cholesterol levels are high. ? You are older than 74 years of age. ? You are at high risk for heart disease.  Cancer screening Lung Cancer  Lung cancer screening is recommended for adults 74-35 years old who are at high risk for lung cancer because of a history of smoking.  A yearly low-dose CT scan of the lungs is recommended for people who: ? Currently smoke. ? Have quit within the past  15 years. ? Have at least a 30-pack-year history of smoking. A pack year is smoking an average of one pack of cigarettes a day for 1 year.  Yearly screening should continue until it has been 15 years since you quit.  Yearly screening should stop if you develop a health problem that would prevent you from having lung cancer treatment.  Breast Cancer  Practice breast self-awareness. This means understanding how your breasts normally appear and feel.  It also means doing regular breast self-exams. Let your health care provider know about any changes, no matter how small.  If you are in your 20s or 30s, you should have a clinical breast exam (CBE) by a health care provider every 1-3 years as part of a regular health exam.  If you are 74 or older, have a CBE every year. Also consider having a breast X-ray (mammogram) every year.  If you have a family history of breast cancer, talk to your health care provider about genetic screening.  If you are at high risk for breast cancer, talk to your health care provider about having an MRI and a mammogram every year.  Breast cancer gene (BRCA) assessment is recommended for women who have family members with BRCA-related cancers. BRCA-related cancers include: ? Breast. ? Ovarian. ? Tubal. ? Peritoneal cancers.  Results of the assessment will determine the need for genetic counseling and BRCA1 and BRCA2 testing.  Cervical Cancer Your health care provider may recommend that you be screened regularly for cancer of the pelvic organs (ovaries, uterus, and vagina). This screening involves a pelvic examination, including checking for microscopic changes to the surface of your cervix (Pap test). You may be encouraged to have this screening done every 3 years, beginning at age 18.  For women ages 74-65, health care providers may recommend pelvic exams and Pap testing every 3 years, or they may recommend the Pap and pelvic exam, combined with testing for human  papilloma virus (HPV), every 5 years. Some types of HPV increase your risk of cervical cancer. Testing for HPV may also be done on women of any age with unclear Pap test results.  Other health care providers may not recommend any screening for nonpregnant women who are considered low risk for pelvic cancer and who do not have symptoms. Ask your health care provider if a screening pelvic exam is right for you.  If you have had past treatment for cervical cancer or a condition that could lead to cancer, you need Pap tests and screening for cancer for at least 20 years after your treatment. If Pap tests have been discontinued, your risk factors (such as having a new sexual partner) need to be reassessed to determine if screening should resume. Some women have medical problems that increase the chance of getting cervical cancer. In these cases, your health care provider may recommend more frequent screening and Pap tests.  Colorectal Cancer  This type of cancer can be detected  and often prevented.  Routine colorectal cancer screening usually begins at 74 years of age and continues through 74 years of age.  Your health care provider may recommend screening at an earlier age if you have risk factors for colon cancer.  Your health care provider may also recommend using home test kits to check for hidden blood in the stool.  A small camera at the end of a tube can be used to examine your colon directly (sigmoidoscopy or colonoscopy). This is done to check for the earliest forms of colorectal cancer.  Routine screening usually begins at age 31.  Direct examination of the colon should be repeated every 5-10 years through 74 years of age. However, you may need to be screened more often if early forms of precancerous polyps or small growths are found.  Skin Cancer  Check your skin from head to toe regularly.  Tell your health care provider about any new moles or changes in moles, especially if there is  a change in a mole's shape or color.  Also tell your health care provider if you have a mole that is larger than the size of a pencil eraser.  Always use sunscreen. Apply sunscreen liberally and repeatedly throughout the day.  Protect yourself by wearing long sleeves, pants, a wide-brimmed hat, and sunglasses whenever you are outside.  Heart disease, diabetes, and high blood pressure  High blood pressure causes heart disease and increases the risk of stroke. High blood pressure is more likely to develop in: ? People who have blood pressure in the high end of the normal range (130-139/85-89 mm Hg). ? People who are overweight or obese. ? People who are African American.  If you are 8-36 years of age, have your blood pressure checked every 3-5 years. If you are 7 years of age or older, have your blood pressure checked every year. You should have your blood pressure measured twice-once when you are at a hospital or clinic, and once when you are not at a hospital or clinic. Record the average of the two measurements. To check your blood pressure when you are not at a hospital or clinic, you can use: ? An automated blood pressure machine at a pharmacy. ? A home blood pressure monitor.  If you are between 66 years and 74 years old, ask your health care provider if you should take aspirin to prevent strokes.  Have regular diabetes screenings. This involves taking a blood sample to check your fasting blood sugar level. ? If you are at a normal weight and have a low risk for diabetes, have this test once every three years after 74 years of age. ? If you are overweight and have a high risk for diabetes, consider being tested at a younger age or more often. Preventing infection Hepatitis B  If you have a higher risk for hepatitis B, you should be screened for this virus. You are considered at high risk for hepatitis B if: ? You were born in a country where hepatitis B is common. Ask your health  care provider which countries are considered high risk. ? Your parents were born in a high-risk country, and you have not been immunized against hepatitis B (hepatitis B vaccine). ? You have HIV or AIDS. ? You use needles to inject street drugs. ? You live with someone who has hepatitis B. ? You have had sex with someone who has hepatitis B. ? You get hemodialysis treatment. ? You take certain medicines for  conditions, including cancer, organ transplantation, and autoimmune conditions.  Hepatitis C  Blood testing is recommended for: ? Everyone born from [birth year] through 1967. ? Anyone with known risk factors for hepatitis C.  Sexually transmitted infections (STIs)  You should be screened for sexually transmitted infections (STIs) including gonorrhea and chlamydia if: ? You are sexually active and are younger than 74 years of age. ? You are older than 74 years of age and your health care provider tells you that you are at risk for this type of infection. ? Your sexual activity has changed since you were last screened and you are at an increased risk for chlamydia or gonorrhea. Ask your health care provider if you are at risk.  If you do not have HIV, but are at risk, it may be recommended that you take a prescription medicine daily to prevent HIV infection. This is called pre-exposure prophylaxis (PrEP). You are considered at risk if: ? You are sexually active and do not regularly use condoms or know the HIV status of your partner(s). ? You take drugs by injection. ? You are sexually active with a partner who has HIV.  Talk with your health care provider about whether you are at high risk of being infected with HIV. If you choose to begin PrEP, you should first be tested for HIV. You should then be tested every 3 months for as long as you are taking PrEP. Pregnancy  If you are premenopausal and you may become pregnant, ask your health care provider about preconception counseling.  If you  may become pregnant, take 400 to 800 micrograms (mcg) of folic acid every day.  If you want to prevent pregnancy, talk to your health care provider about birth control (contraception). Osteoporosis and menopause  Osteoporosis is a disease in which the bones lose minerals and strength with aging. This can result in serious bone fractures. Your risk for osteoporosis can be identified using a bone density scan.  If you are 63 years of age or older, or if you are at risk for osteoporosis and fractures, ask your health care provider if you should be screened.  Ask your health care provider whether you should take a calcium or vitamin D supplement to lower your risk for osteoporosis.  Menopause may have certain physical symptoms and risks.  Hormone replacement therapy may reduce some of these symptoms and risks. Talk to your health care provider about whether hormone replacement therapy is right for you. Follow these instructions at home:  Schedule regular health, dental, and eye exams.  Stay current with your immunizations.  Do not use any tobacco products including cigarettes, chewing tobacco, or electronic cigarettes.  If you are pregnant, do not drink alcohol.  If you are breastfeeding, limit how much and how often you drink alcohol.  Limit alcohol intake to no more than 1 drink per day for nonpregnant women. One drink equals 12 ounces of beer, 5 ounces of Chaz Ronning, or 1 ounces of hard liquor.  Do not use street drugs.  Do not share needles.  Ask your health care provider for help if you need support or information about quitting drugs.  Tell your health care provider if you often feel depressed.  Tell your health care provider if you have ever been abused or do not feel safe at home. This information is not intended to replace advice given to you by your health care provider. Make sure you discuss any questions you have with your health care provider.  Document Released: 09/03/2010  Document Revised: 07/27/2015 Document Reviewed: 11/22/2014 Elsevier Interactive Patient Education  [employer].

## 2017-08-24 NOTE — Progress Notes (Signed)
Medical screening examination/treatment/procedure(s) were performed by the Wellness Coach, RN. As primary care provider I was immediately available for consulation/collaboration. I agree with above documentation. Lititia Sen, NP  

## 2017-08-29 ENCOUNTER — Other Ambulatory Visit: Payer: Self-pay | Admitting: Gynecology

## 2017-09-11 ENCOUNTER — Ambulatory Visit: Payer: Medicare Other | Admitting: Internal Medicine

## 2017-09-15 ENCOUNTER — Encounter: Payer: Self-pay | Admitting: Internal Medicine

## 2017-09-15 ENCOUNTER — Other Ambulatory Visit (INDEPENDENT_AMBULATORY_CARE_PROVIDER_SITE_OTHER): Payer: Medicare Other

## 2017-09-15 ENCOUNTER — Other Ambulatory Visit: Payer: Self-pay | Admitting: Internal Medicine

## 2017-09-15 ENCOUNTER — Ambulatory Visit (INDEPENDENT_AMBULATORY_CARE_PROVIDER_SITE_OTHER): Payer: Medicare Other | Admitting: Internal Medicine

## 2017-09-15 VITALS — BP 140/88 | HR 94 | Temp 98.0°F | Resp 16 | Ht 65.0 in | Wt 141.0 lb

## 2017-09-15 DIAGNOSIS — I10 Essential (primary) hypertension: Secondary | ICD-10-CM | POA: Diagnosis not present

## 2017-09-15 DIAGNOSIS — E538 Deficiency of other specified B group vitamins: Secondary | ICD-10-CM

## 2017-09-15 DIAGNOSIS — E785 Hyperlipidemia, unspecified: Secondary | ICD-10-CM | POA: Diagnosis not present

## 2017-09-15 DIAGNOSIS — R7989 Other specified abnormal findings of blood chemistry: Secondary | ICD-10-CM

## 2017-09-15 DIAGNOSIS — Z0000 Encounter for general adult medical examination without abnormal findings: Secondary | ICD-10-CM

## 2017-09-15 DIAGNOSIS — I480 Paroxysmal atrial fibrillation: Secondary | ICD-10-CM

## 2017-09-15 LAB — LDL CHOLESTEROL, DIRECT: Direct LDL: 116 mg/dL

## 2017-09-15 LAB — CBC WITH DIFFERENTIAL/PLATELET
Basophils Absolute: 0 10*3/uL (ref 0.0–0.1)
Basophils Relative: 0.4 % (ref 0.0–3.0)
Eosinophils Absolute: 0.2 10*3/uL (ref 0.0–0.7)
Eosinophils Relative: 2.4 % (ref 0.0–5.0)
HCT: 42.7 % (ref 36.0–46.0)
Hemoglobin: 14.4 g/dL (ref 12.0–15.0)
Lymphocytes Relative: 26.6 % (ref 12.0–46.0)
Lymphs Abs: 1.9 10*3/uL (ref 0.7–4.0)
MCHC: 33.7 g/dL (ref 30.0–36.0)
MCV: 90.8 fl (ref 78.0–100.0)
Monocytes Absolute: 0.5 10*3/uL (ref 0.1–1.0)
Monocytes Relative: 7.6 % (ref 3.0–12.0)
Neutro Abs: 4.5 10*3/uL (ref 1.4–7.7)
Neutrophils Relative %: 63 % (ref 43.0–77.0)
Platelets: 164 10*3/uL (ref 150.0–400.0)
RBC: 4.7 Mil/uL (ref 3.87–5.11)
RDW: 13.7 % (ref 11.5–15.5)
WBC: 7.1 10*3/uL (ref 4.0–10.5)

## 2017-09-15 LAB — VITAMIN D 25 HYDROXY (VIT D DEFICIENCY, FRACTURES): VITD: 42.91 ng/mL (ref 30.00–100.00)

## 2017-09-15 LAB — LIPID PANEL
Cholesterol: 209 mg/dL — ABNORMAL HIGH (ref 0–200)
HDL: 70.1 mg/dL (ref >39.00)
NonHDL: 138.85
Total CHOL/HDL Ratio: 3
Triglycerides: 236 mg/dL — ABNORMAL HIGH (ref 0.0–149.0)
VLDL: 47.2 mg/dL — ABNORMAL HIGH (ref 0.0–40.0)

## 2017-09-15 LAB — TSH: TSH: <0.01 u[IU]/mL — ABNORMAL LOW (ref 0.35–4.50)

## 2017-09-15 MED ORDER — PITAVASTATIN CALCIUM 2 MG PO TABS: 1.0000 | ORAL_TABLET | Freq: Every day | ORAL | 1 refills | Status: DC

## 2017-09-15 MED ORDER — FLUTICASONE PROPIONATE 50 MCG/ACT NA SUSP: NASAL | 11 refills | Status: DC

## 2017-09-15 NOTE — Progress Notes (Signed)
Subjective:  Patient ID: Elizabeth Mcconnell, female    DOB: Oct 26, 1945  Age: 72 y.o. MRN: [id number]  CC: Annual Exam; Hypertension; Hyperlipidemia; and Atrial Fibrillation   HPI Elizabeth Mcconnell presents for a CPX.  Her husband died suddenly about 6 months ago and since then she has been grieving.  She complains of episodes of crying spells, insomnia and anxiety.  She is controlling her symptoms with the occasional dose of Xanax.  Her weight and appetite are normal.  She denies feeling hopeless, helpless, or suicidal.  She is also status post ablation and has had no palpitations since the ablation.  She has good endurance and denies any recent episodes of DOE, CP, lightheadedness, or dizziness.  Past Medical History:  Diagnosis Date  . Adenomatous polyp of colon 10/2013   f/u colo 10/2018  . Allergic rhinitis   . ALLERGIC RHINITIS   . ANXIETY    "situational" (02/14/2015)  . Arthritis    "neck, back, hands" (02/14/2015)  . Asthma   . Atrial fibrillation with RVR ([place])   . Atrial flutter ([place])    failed cardioversion 11/2012; s/p ablation 02-02-2013 by Dr Rayann Heman  . Chronic bronchitis ([place])    "I'll have it most years" (02/14/2015)  . Diverticulosis   . Endometriosis   . Essential hypertension 05/30/2014  . Hepatic steatosis   . Oral candidiasis   . Osteopenia 08/2015   T score -1.7 FRAX 14%/4.4% overall stable from prior DEXA.  [name] Pneumothorax on left 1996   left lower lung collapse s/p resection  . Small bowel obstruction ([place])   . Tobacco abuse    Past Surgical History:  Procedure Laterality Date  . ABDOMINAL HYSTERECTOMY    . APPENDECTOMY  1978  . ATRIAL FIBRILLATION ABLATION N/A 11/05/2016   Procedure: Atrial Fibrillation Ablation;  Surgeon: Thompson Grayer, MD;  Location: [facility];  Service: Cardiovascular;  Laterality: N/A;  . ATRIAL FLUTTER ABLATION N/A 02/02/2013   Procedure: ATRIAL FLUTTER ABLATION;  Surgeon: Coralyn Mark, MD;  Location: [facility];   Service: Cardiovascular;  Laterality: N/A;  . CARDIOVERSION N/A 11/27/2012   Procedure: CARDIOVERSION;  Surgeon: Thayer Headings, MD;  Location: [facility];  Service: Cardiovascular;  Laterality: N/A;  . [place]; 1978  . LAPAROSCOPIC PARTIAL COLECTOMY N/A 05/04/2015   Procedure: LAPAROSCOPIC LOW ANTERIOR RESECTION LAPAROSCOPIC LYSIS OF ADHESIONS, SPLENIC FLEXURE MOBILIZATION;  Surgeon: Michael Boston, MD;  Location: [facility];  Service: General;  Laterality: N/A;  . LUNG REMOVAL, PARTIAL Left 02/1965   LLL  . TOTAL ABDOMINAL HYSTERECTOMY W/ BILATERAL SALPINGOOPHORECTOMY  1984   Endometriosis    reports that she has been smoking cigarettes.  She has a 10.75 pack-year smoking history. She has never used smokeless tobacco. She reports that she drinks about 8.4 oz of alcohol per week. She reports that she does not use drugs. family history includes Asthma in her child and maternal grandfather; Diabetes in her mother; Heart disease in her father; Hypertension in her father; Stroke (age of onset: 80) in her father. Allergies  Allergen Reactions  . Tramadol Other (See Comments)    Reaction:  Dizziness and weakness   . Amoxicillin-Pot Clavulanate Nausea And Vomiting and Other (See Comments)    Has patient had a PCN reaction causing immediate rash, facial/tongue/throat swelling, SOB or lightheadedness with hypotension: No Has patient had a PCN reaction causing severe rash involving mucus membranes or skin necrosis: No Has patient had a PCN reaction that required  hospitalization No Has patient had a PCN reaction occurring within the last 10 years: No If all of the above answers are "NO", then may proceed with Cephalosporin use.  . Flagyl [Metronidazole] Nausea And Vomiting  . Flecainide Other (See Comments)    Dizziness   . Omnicef [Cefdinir] Nausea And Vomiting  . Avelox [Moxifloxacin] Hives and Itching    Outpatient Medications Prior to Visit  Medication Sig Dispense Refill  .  acetaminophen (TYLENOL) 650 MG CR tablet Take 1,300 mg by mouth 2 (two) times daily as needed for pain.     [name] ALPRAZolam (XANAX) 0.25 MG tablet TAKE 1 TABLET BY MOUTH 2 TIMES DAILY FOR anxiety 60 tablet 1  . Calcium Carb-Cholecalciferol (CALCIUM 600+D) 600-800 MG-UNIT TABS Take 1 tablet by mouth daily.    . cyanocobalamin 2000 MCG tablet Take 1 tablet (2,000 mcg total) by mouth daily. 90 tablet 3  . dofetilide (TIKOSYN) 250 MCG capsule TAKE 1 CAPSULE BY MOUTH 2 TIMES DAILY 180 capsule 3  . ELIQUIS 5 MG TABS tablet TAKE 1 TABLET BY MOUTH 2 TIMES DAILY 180 tablet 2  . estradiol (VIVELLE-DOT) 0.05 MG/24HR patch apply 1 PATCH TO THE SKIN TWICE A WEEK 8 patch 0  . FLOVENT HFA 110 MCG/ACT inhaler INHALE 2 PUFFS INTO THE LUNGS 2 TIMES DAILY 12 g 5  . levalbuterol (XOPENEX HFA) 45 MCG/ACT inhaler inhale 1 TO 2 PUFFS BY MOUTH EVERY 4 HOURS AS NEEDED 15 g 3  . magnesium oxide (MAG-OX) 400 MG tablet Take 1 tablet (400 mg total) by mouth daily. 30 tablet 6  . montelukast (SINGULAIR) 10 MG tablet Take 1 tablet (10 mg total) by mouth daily. 30 tablet 11  . potassium chloride (K-DUR,KLOR-CON) 10 MEQ tablet Take 20 mEq by mouth daily.    . fluticasone (FLONASE) 50 MCG/ACT nasal spray USE 2 SPRAYS IN EACH NOSTRIL 2 TIMES DAILY 16 g 5   No facility-administered medications prior to visit.     ROS Review of Systems  Constitutional: Negative.  Negative for diaphoresis, fatigue and unexpected weight change.  HENT: Negative.   Eyes: Negative for visual disturbance.  Respiratory: Negative for cough, chest tightness, shortness of breath and wheezing.   Cardiovascular: Negative for chest pain, palpitations and leg swelling.  Gastrointestinal: Negative for abdominal pain, constipation, diarrhea, nausea and vomiting.  Endocrine: Negative.  Negative for cold intolerance and heat intolerance.  Genitourinary: Negative.  Negative for difficulty urinating.  Musculoskeletal: Negative.  Negative for arthralgias, back pain,  myalgias and neck pain.  Skin: Negative.   Neurological: Negative.  Negative for dizziness, weakness and light-headedness.  Hematological: Negative for adenopathy. Does not bruise/bleed easily.  Psychiatric/Behavioral: Positive for sleep disturbance. Negative for agitation, decreased concentration, dysphoric mood, self-injury and suicidal ideas. The patient is nervous/anxious.     Objective:  BP 140/88 (BP Location: Left Arm, Patient Position: Sitting, Cuff Size: Normal)   Pulse 94   Temp 98 F (36.7 C) (Oral)   Resp 16   Ht 5\' 5"  (1.651 m)   Wt 141 lb (64 kg)   SpO2 97%   BMI 23.46 kg/m   BP Readings from Last 3 Encounters:  09/15/17 140/88  08/22/17 128/74  08/13/17 124/72    Wt Readings from Last 3 Encounters:  09/15/17 141 lb (64 kg)  08/22/17 139 lb (63 kg)  08/13/17 139 lb (63 kg)    Physical Exam  Constitutional: She is oriented to person, place, and time. No distress.  HENT:  Mouth/Throat: Oropharynx is clear  and moist. No oropharyngeal exudate.  Eyes: Conjunctivae are normal. No scleral icterus.  Neck: Normal range of motion. Neck supple. No JVD present. No thyromegaly present.  Cardiovascular: Normal rate, regular rhythm and normal heart sounds.  No murmur heard. Pulmonary/Chest: Effort normal and breath sounds normal. She has no wheezes. She has no rhonchi. She has no rales.  Abdominal: Soft. Normal appearance and bowel sounds are normal. She exhibits no mass. There is no hepatosplenomegaly. There is no tenderness.  Genitourinary:  Genitourinary Comments: GU, breast, and rectal exams were deferred at her request.  Musculoskeletal: Normal range of motion. She exhibits no edema, tenderness or deformity.  Lymphadenopathy:    She has no cervical adenopathy.  Neurological: She is alert and oriented to person, place, and time.  Skin: Skin is warm and dry. She is not diaphoretic. No pallor.  Psychiatric: Her speech is normal and behavior is normal. Judgment and  thought content normal. Her mood appears anxious. Thought content is not paranoid. Cognition and memory are normal. She does not exhibit a depressed mood. She expresses no homicidal and no suicidal ideation. She is attentive.  Vitals reviewed.   Lab Results  Component Value Date   WBC 7.1 09/15/2017   HGB 14.4 09/15/2017   HCT 42.7 09/15/2017   PLT 164.0 09/15/2017   GLUCOSE 97 08/13/2017   CHOL 209 (H) 09/15/2017   TRIG 236.0 (H) 09/15/2017   HDL 70.10 09/15/2017   LDLDIRECT 116.0 09/15/2017   LDLCALC 157 (H) 05/30/2015   ALT 16 11/11/2016   AST 18 11/11/2016   NA 141 08/13/2017   K 4.2 08/13/2017   CL 102 08/13/2017   CREATININE 0.70 08/13/2017   BUN 10 08/13/2017   CO2 24 08/13/2017   TSH <0.01 Repeated and verified X2. (L) 09/15/2017   INR 1.06 05/09/2016   HGBA1C 5.5 05/03/2015    No results found.  Assessment & Plan:   Elizabeth Mcconnell was seen today for annual exam, hypertension, hyperlipidemia and atrial fibrillation.  Diagnoses and all orders for this visit:  Hyperlipidemia with target LDL less than 160- She has an elevated ASCVD risk score so I have asked her to start taking a statin for CV risk reduction. -     Lipid panel; Future -     Pitavastatin Calcium (LIVALO) 2 MG TABS; Take 1 tablet (2 mg total) by mouth daily.  B12 nutritional deficiency- Improvement noted -     CBC with Differential/Platelet; Future  Essential hypertension- Her blood pressure is adequately well controlled.  Electrolytes and renal function are normal. -     TSH; Future -     VITAMIN D 25 Hydroxy (Vit-D Deficiency, Fractures); Future  Paroxysmal atrial fibrillation ([place])- She has good rate and rhythm control.  Will continue anticoagulation with Eliquis.  Low TSH level- She has a new onset, suppressed TSH level.  She does complain of anxiety but otherwise appears euthyroid.  I have asked her to see endocrinology for more extensive evaluation of this. -     Ambulatory referral to  Endocrinology  Routine general medical examination at a health care facility   I am having Elizabeth Mcconnell A. Leask "Elizabeth Mcconnell" start on Pitavastatin Calcium. I am also having her maintain her acetaminophen, magnesium oxide, cyanocobalamin, Calcium Carb-Cholecalciferol, dofetilide, potassium chloride, levalbuterol, ELIQUIS, montelukast, ALPRAZolam, FLOVENT HFA, and estradiol.  Meds ordered this encounter  Medications  . Pitavastatin Calcium (LIVALO) 2 MG TABS    Sig: Take 1 tablet (2 mg total) by mouth daily.  Dispense:  90 tablet    Refill:  1   See AVS for instructions about healthy living and anticipatory guidance.  Follow-up: Return in about 6 months (around 03/18/2018).  [name], MD

## 2017-09-15 NOTE — Patient Instructions (Signed)

## 2017-09-16 ENCOUNTER — Encounter: Payer: Self-pay | Admitting: Internal Medicine

## 2017-09-17 NOTE — Assessment & Plan Note (Signed)

## 2017-09-22 ENCOUNTER — Encounter: Payer: Self-pay | Admitting: Gynecology

## 2017-09-22 ENCOUNTER — Ambulatory Visit: Payer: Medicare Other | Admitting: Gynecology

## 2017-09-22 VITALS — BP 140/86 | Ht 64.0 in | Wt 140.0 lb

## 2017-09-22 DIAGNOSIS — M8580 Other specified disorders of bone density and structure, unspecified site: Secondary | ICD-10-CM

## 2017-09-22 DIAGNOSIS — N952 Postmenopausal atrophic vaginitis: Secondary | ICD-10-CM | POA: Diagnosis not present

## 2017-09-22 DIAGNOSIS — Z79890 Hormone replacement therapy (postmenopausal): Secondary | ICD-10-CM | POA: Diagnosis not present

## 2017-09-22 DIAGNOSIS — Z01419 Encounter for gynecological examination (general) (routine) without abnormal findings: Secondary | ICD-10-CM

## 2017-09-22 DIAGNOSIS — N898 Other specified noninflammatory disorders of vagina: Secondary | ICD-10-CM

## 2017-09-22 MED ORDER — ESTRADIOL 0.05 MG/24HR TD PTTW: MEDICATED_PATCH | TRANSDERMAL | 12 refills | Status: DC

## 2017-09-22 NOTE — Patient Instructions (Signed)
Follow-up for the bone density as scheduled. 

## 2017-09-22 NOTE — Progress Notes (Signed)
    Elizabeth Mcconnell 04/19/1945 [id number]        72 y.o.  [id number] for annual gynecologic exam.  Unfortunately lost her husband this past year.  Without gynecologic complaints.  Past medical history,surgical history, problem list, medications, allergies, family history and social history were all reviewed and documented as reviewed in the EPIC chart.  ROS:  Performed with pertinent positives and negatives included in the history, assessment and plan.   Additional significant findings : None   Exam: [name] assistant Vitals:   09/22/17 1201  BP: 140/86  Weight: 140 lb (63.5 kg)  Height: 5\' 4"  (1.626 m)   Body mass index is 24.03 kg/m.  General appearance:  Normal affect, orientation and appearance. Skin: Grossly normal HEENT: Without gross lesions.  No cervical or supraclavicular adenopathy. Thyroid normal.  Lungs:  Clear without wheezing, rales or rhonchi Cardiac: RR, without RMG Abdominal:  Soft, nontender, without masses, guarding, rebound, organomegaly or hernia Breasts:  Examined lying and sitting without masses, retractions, discharge or axillary adenopathy. Pelvic:  Ext, BUS, Vagina: With atrophic changes.  2 cm midline posterior vaginal wall submucosal cyst  Adnexa: Without masses or tenderness    Anus and perineum: Normal   Rectovaginal: Normal sphincter tone without palpated masses or tenderness.    Assessment/Plan:  72 y.o. G43P2002 female for annual gynecologic exam.   1. Vaginal cyst.  Present and stable on years serial exams.  Asymptomatic to the patient.  Continue with annual exam follow-up. 2. Postmenopausal/ERT.  Continues on Vivelle 0.05 mg patches.  Status post TAH/BSO in the past for endometriosis.  We again reviewed the risks versus benefits to include increased risk of thrombosis such as stroke heart attack DVT as well as the breast cancer issue.  She continues on Eliquis with a history of A. fib.  At this point the patient strongly wants to continue  understanding and accepting the risks particularly thrombosis.  Benefits of transdermal move her oral from a thrombosis standpoint reviewed also.  Refill x1 year provided. 3. Osteopenia.  DEXA 2017 T score -1.7.  Recommend follow-up DEXA now and patient is going to schedule in follow-up for this. 4. Mammography 07/2017.  Continue with annual mammography when due.  Breast exam normal today. 5. Pap smear 2011.  No Pap smear done today.  No history of abnormal Pap smears.  We both agree to stop screening based on age and hysterectomy history per current screening guidelines. 6. Colonoscopy 2015.  Repeat at their recommended interval. 7. Health maintenance.  No routine lab work done as patient does this elsewhere.  Follow-up 1 year, sooner as needed.   [name] MD, 1:09 PM 09/22/2017

## 2017-10-08 ENCOUNTER — Encounter: Payer: Self-pay | Admitting: Internal Medicine

## 2017-11-17 ENCOUNTER — Other Ambulatory Visit: Payer: Self-pay | Admitting: Internal Medicine

## 2017-11-20 ENCOUNTER — Ambulatory Visit: Payer: Medicare Other | Admitting: Internal Medicine

## 2017-11-24 ENCOUNTER — Encounter: Payer: Self-pay | Admitting: Internal Medicine

## 2017-11-24 ENCOUNTER — Ambulatory Visit: Payer: Medicare Other | Admitting: Internal Medicine

## 2017-11-24 VITALS — BP 124/82 | HR 99 | Ht 65.0 in | Wt 142.0 lb

## 2017-11-24 DIAGNOSIS — Z23 Encounter for immunization: Secondary | ICD-10-CM

## 2017-11-24 DIAGNOSIS — I480 Paroxysmal atrial fibrillation: Secondary | ICD-10-CM

## 2017-11-24 NOTE — Patient Instructions (Addendum)
Medication Instructions:  Your physician recommends that you continue on your current medications as directed. Please refer to the Current Medication list given to you today.  Labwork: You will get lab work today:  BMET and magnesium  Testing/Procedures: None ordered.  Follow-Up: Your physician wants you to follow-up in: 6 months with Dr. Rayann Heman.   You will receive a reminder letter in the mail two months in advance. If you don't receive a letter, please call our office to schedule the follow-up appointment.  Any Other Special Instructions Will Be Listed Below (If Applicable).  If you need a refill on your cardiac medications before your next appointment, please call your pharmacy.

## 2017-11-24 NOTE — Progress Notes (Signed)
PCP: Janith Lima, MD Primary Cardiologist: Dr Burt Knack Primary EP: Dr Rayann Heman  Elizabeth Mcconnell is a 72 y.o. female who presents today for routine electrophysiology followup.  Since last being seen in our clinic, the patient reports doing very well.  Today, she denies symptoms of palpitations, chest pain, shortness of breath,  lower extremity edema, dizziness, presyncope, or syncope.  The patient is otherwise without complaint today.   Past Medical History:  Diagnosis Date  . Adenomatous polyp of colon 10/2013   f/u colo 10/2018  . Allergic rhinitis   . ALLERGIC RHINITIS   . ANXIETY    "situational" (02/14/2015)  . Arthritis    "neck, back, hands" (02/14/2015)  . Asthma   . Atrial fibrillation with RVR ([place])   . Atrial flutter ([place])    failed cardioversion 11/2012; s/p ablation 02-02-2013 by Dr Rayann Heman  . Chronic bronchitis ([place])    "I'll have it most years" (02/14/2015)  . Diverticulosis   . Endometriosis   . Essential hypertension 05/30/2014  . Hepatic steatosis   . Oral candidiasis   . Osteopenia 08/2015   T score -1.7 FRAX 14%/4.4% overall stable from prior DEXA.  [name] Pneumothorax on left 1996   left lower lung collapse s/p resection  . Small bowel obstruction ([place])   . Tobacco abuse    Past Surgical History:  Procedure Laterality Date  . ABDOMINAL HYSTERECTOMY    . APPENDECTOMY  1978  . ATRIAL FIBRILLATION ABLATION N/A 11/05/2016   Procedure: Atrial Fibrillation Ablation;  Surgeon: Thompson Grayer, MD;  Location: [facility];  Service: Cardiovascular;  Laterality: N/A;  . ATRIAL FLUTTER ABLATION N/A 02/02/2013   Procedure: ATRIAL FLUTTER ABLATION;  Surgeon: Coralyn Mark, MD;  Location: [facility];  Service: Cardiovascular;  Laterality: N/A;  . CARDIOVERSION N/A 11/27/2012   Procedure: CARDIOVERSION;  Surgeon: Thayer Headings, MD;  Location: [facility];  Service: Cardiovascular;  Laterality: N/A;  . [place]; 1978  . LAPAROSCOPIC PARTIAL COLECTOMY  N/A 05/04/2015   Procedure: LAPAROSCOPIC LOW ANTERIOR RESECTION LAPAROSCOPIC LYSIS OF ADHESIONS, SPLENIC FLEXURE MOBILIZATION;  Surgeon: Michael Boston, MD;  [facility];  Service: General;  Laterality: N/A;  . LUNG REMOVAL, PARTIAL Left 02/1965   LLL  . TOTAL ABDOMINAL HYSTERECTOMY W/ BILATERAL SALPINGOOPHORECTOMY  1984   Endometriosis    ROS- all systems are reviewed and negatives except as per HPI above  Current Outpatient Medications  Medication Sig Dispense Refill  . acetaminophen (TYLENOL) 650 MG CR tablet Take 1,300 mg by mouth 2 (two) times daily as needed for pain.     [name] ALPRAZolam (XANAX) 0.25 MG tablet TAKE 1 TABLET BY MOUTH 2 TIMES DAILY FOR anxiety 60 tablet 3  . Calcium Carb-Cholecalciferol (CALCIUM 600+D) 600-800 MG-UNIT TABS Take 1 tablet by mouth daily.    . cyanocobalamin 2000 MCG tablet Take 1 tablet (2,000 mcg total) by mouth daily. 90 tablet 3  . dofetilide (TIKOSYN) 250 MCG capsule TAKE 1 CAPSULE BY MOUTH 2 TIMES DAILY 180 capsule 3  . ELIQUIS 5 MG TABS tablet TAKE 1 TABLET BY MOUTH 2 TIMES DAILY 180 tablet 2  . estradiol (VIVELLE-DOT) 0.05 MG/24HR patch apply 1 PATCH TO THE SKIN TWICE A WEEK 8 patch 12  . FLOVENT HFA 110 MCG/ACT inhaler INHALE 2 PUFFS INTO THE LUNGS 2 TIMES DAILY 12 g 5  . fluticasone (FLONASE) 50 MCG/ACT nasal spray USE 2 SPRAYS IN EACH NOSTRIL 2 TIMES DAILY 16 g 11  . levalbuterol (  XOPENEX HFA) 45 MCG/ACT inhaler inhale 1 TO 2 PUFFS BY MOUTH EVERY 4 HOURS AS NEEDED 15 g 3  . magnesium oxide (MAG-OX) 400 MG tablet Take 1 tablet (400 mg total) by mouth daily. 30 tablet 6  . montelukast (SINGULAIR) 10 MG tablet Take 1 tablet (10 mg total) by mouth daily. 30 tablet 11  . Pitavastatin Calcium (LIVALO) 2 MG TABS Take 1 tablet (2 mg total) by mouth daily. 90 tablet 1  . potassium chloride (K-DUR,KLOR-CON) 10 MEQ tablet Take 20 mEq by mouth daily.     No current facility-administered medications for this visit.     Physical Exam: Vitals:   11/24/17  1402  BP: 124/82  Pulse: 99  SpO2: 96%  Weight: 142 lb (64.4 kg)  Height: 5\' 5"  (1.651 m)    GEN- The patient is well appearing, alert and oriented x 3 today.   Head- normocephalic, atraumatic Eyes-  L eye with subconjunctival hemorrhage Ears- hearing intact Oropharynx- clear Lungs- Clear to ausculation bilaterally, normal work of breathing Heart- Regular rate and rhythm, no murmurs, rubs or gallops, PMI not laterally displaced GI- soft, NT, ND, + BS Extremities- no clubbing, cyanosis, or edema  Wt Readings from Last 3 Encounters:  11/24/17 142 lb (64.4 kg)  09/22/17 140 lb (63.5 kg)  09/15/17 141 lb (64 kg)    EKG tracing ordered today is personally reviewed and shows sinus rhythm 99 bpm, PR 190 msec, QRS 96 msec, Qtc 479 msec, incomplete RBBB  Assessment and Plan:  1. Paroxysmal atrial fibrillation Maintaining sinus rhythm with tikosyn.  She has decided to continue tikosyn, though she has not had afib post ablation Bmet, mg today chads2vasc score is 3.  Continue eliquis  2. Post termination pauses Resolved post ablation  Return in 6 months  Thompson Grayer MD, [facility] 11/24/2017 2:09 PM

## 2017-11-25 ENCOUNTER — Other Ambulatory Visit: Payer: Self-pay | Admitting: Nurse Practitioner

## 2017-11-25 LAB — BASIC METABOLIC PANEL
BUN/Creatinine Ratio: 19 (ref 12–28)
BUN: 13 mg/dL (ref 8–27)
CO2: 21 mmol/L (ref 20–29)
Calcium: 9.4 mg/dL (ref 8.7–10.3)
Chloride: 107 mmol/L — ABNORMAL HIGH (ref 96–106)
Creatinine, Ser: 0.67 mg/dL (ref 0.57–1.00)
GFR calc Af Amer: 100 mL/min/{1.73_m2} (ref >59)
GFR calc non Af Amer: 87 mL/min/{1.73_m2} (ref >59)
Glucose: 93 mg/dL (ref 65–99)
Potassium: 4.2 mmol/L (ref 3.5–5.2)
Sodium: 143 mmol/L (ref 134–144)

## 2017-11-25 LAB — MAGNESIUM: Magnesium: 2.1 mg/dL (ref 1.6–2.3)

## 2017-12-15 ENCOUNTER — Other Ambulatory Visit (HOSPITAL_COMMUNITY): Payer: Self-pay | Admitting: Nurse Practitioner

## 2017-12-16 ENCOUNTER — Encounter: Payer: Self-pay | Admitting: Endocrinology

## 2018-01-12 ENCOUNTER — Ambulatory Visit (INDEPENDENT_AMBULATORY_CARE_PROVIDER_SITE_OTHER): Payer: Medicare Other

## 2018-01-12 ENCOUNTER — Other Ambulatory Visit: Payer: Self-pay | Admitting: Gynecology

## 2018-01-12 DIAGNOSIS — M8589 Other specified disorders of bone density and structure, multiple sites: Secondary | ICD-10-CM

## 2018-01-12 DIAGNOSIS — Z780 Asymptomatic menopausal state: Secondary | ICD-10-CM

## 2018-01-12 DIAGNOSIS — M8580 Other specified disorders of bone density and structure, unspecified site: Secondary | ICD-10-CM

## 2018-01-13 ENCOUNTER — Telehealth: Payer: Self-pay | Admitting: *Deleted

## 2018-01-13 NOTE — Telephone Encounter (Signed)
Bone density showed degenerative changes in spine, we can no longer use her spine need to scan her forearm. Please make an appt anytime we will work it in our schedule.

## 2018-01-13 NOTE — Telephone Encounter (Signed)
Pt called back will scan forearm 01/16/18 at 11:00

## 2018-01-20 ENCOUNTER — Encounter: Payer: Self-pay | Admitting: Gynecology

## 2018-01-20 ENCOUNTER — Telehealth: Payer: Self-pay | Admitting: Gynecology

## 2018-01-20 NOTE — Telephone Encounter (Signed)
Tell patient that her most recent bone density shows an increased risk of hip fracture based on calculations that we do.  I would recommend office visit to discuss treatment options.

## 2018-01-21 NOTE — Telephone Encounter (Signed)
Sent mychart message

## 2018-01-22 NOTE — Telephone Encounter (Signed)
patient has read my chart message

## 2018-01-26 NOTE — Telephone Encounter (Signed)
Complete

## 2018-02-11 ENCOUNTER — Other Ambulatory Visit: Payer: Self-pay | Admitting: Internal Medicine

## 2018-02-11 NOTE — Telephone Encounter (Signed)
Eliquis 5mg  refill request received; pt is 72 yrs ld, wt-64.4kg, Crea-0.67 on 11/24/17, last seen by Dr. Rayann Heman on 11/24/17; will send in refill to requested pharmacy.

## 2018-03-12 ENCOUNTER — Other Ambulatory Visit: Payer: Self-pay | Admitting: Internal Medicine

## 2018-03-12 DIAGNOSIS — E785 Hyperlipidemia, unspecified: Secondary | ICD-10-CM

## 2018-05-07 ENCOUNTER — Telehealth: Payer: Self-pay | Admitting: *Deleted

## 2018-05-07 NOTE — Telephone Encounter (Signed)
Prior authorization approved for estradiol (vivelle-dot) patch 0.05mg  via [employer], approved through 03/04/19. Pharmacy informed as well.

## 2018-05-25 ENCOUNTER — Ambulatory Visit: Payer: Medicare Other | Admitting: Internal Medicine

## 2018-06-08 ENCOUNTER — Other Ambulatory Visit: Payer: Self-pay | Admitting: Internal Medicine

## 2018-06-08 DIAGNOSIS — E785 Hyperlipidemia, unspecified: Secondary | ICD-10-CM

## 2018-06-20 ENCOUNTER — Telehealth: Payer: Self-pay

## 2018-06-20 NOTE — Telephone Encounter (Signed)
Spoke with pt regarding appt on 06/22/18. Pt was advise to check vitals prior to appt. Pt questions and concerns were address

## 2018-06-22 ENCOUNTER — Telehealth (INDEPENDENT_AMBULATORY_CARE_PROVIDER_SITE_OTHER): Payer: Medicare Other | Admitting: Internal Medicine

## 2018-06-22 VITALS — BP 156/98 | HR 87

## 2018-06-22 DIAGNOSIS — I480 Paroxysmal atrial fibrillation: Secondary | ICD-10-CM

## 2018-06-22 DIAGNOSIS — I10 Essential (primary) hypertension: Secondary | ICD-10-CM

## 2018-06-22 NOTE — Progress Notes (Signed)
Electrophysiology TeleHealth Note   Due to national recommendations of social distancing due to COVID 19, an audio/video telehealth visit is felt to be most appropriate for this patient at this time.  See MyChart message from today for the patient's consent to telehealth for [facility].   Date:  06/22/2018   ID:  Elizabeth, Mcconnell [birth year], MRN [id number]  Location: patient's home  Provider location: [address] [place]  Evaluation Performed: Follow-up visit  PCP:  Janith Lima, MD  Cardiologist:  Burt Knack Electrophysiologist:  Dr Rayann Heman  Chief Complaint:  AF follow up  History of Present Illness:    Elizabeth Mcconnell is a 73 y.o. female who presents via audio/video conferencing for a telehealth visit today.  Since last being seen in our clinic, the patient reports doing very well.  Today, she denies symptoms of palpitations, chest pain, shortness of breath,  lower extremity edema, dizziness, presyncope, or syncope.  She has been walking with some friends daily.  The patient is otherwise without complaint today.  The patient denies symptoms of fevers, chills, cough, or new SOB worrisome for COVID 19.  Past Medical History:  Diagnosis Date  . Adenomatous polyp of colon 10/2013   f/u colo 10/2018  . Allergic rhinitis   . ALLERGIC RHINITIS   . ANXIETY    "situational" (02/14/2015)  . Arthritis    "neck, back, hands" (02/14/2015)  . Asthma   . Atrial fibrillation with RVR ([place])   . Atrial flutter ([place])    failed cardioversion 11/2012; s/p ablation 02-02-2013 by Dr Rayann Heman  . Chronic bronchitis ([place])    "I'll have it most years" (02/14/2015)  . Diverticulosis   . Endometriosis   . Essential hypertension 05/30/2014  . Hepatic steatosis   . Oral candidiasis   . Osteopenia 08/2015   T score -1.9 FRAX 16% / 6.5%  . Pneumothorax on left 1996   left lower lung collapse s/p resection  . Small bowel obstruction ([place])   . Tobacco abuse     Past  Surgical History:  Procedure Laterality Date  . ABDOMINAL HYSTERECTOMY    . APPENDECTOMY  1978  . ATRIAL FIBRILLATION ABLATION N/A 11/05/2016   Procedure: Atrial Fibrillation Ablation;  Surgeon: Thompson Grayer, MD;  Location: [place] CV [facility];  Service: Cardiovascular;  Laterality: N/A;  . ATRIAL FLUTTER ABLATION N/A 02/02/2013   Procedure: ATRIAL FLUTTER ABLATION;  Surgeon: Coralyn Mark, MD;  Location: [place] CATH LAB;  Service: Cardiovascular;  Laterality: N/A;  . CARDIOVERSION N/A 11/27/2012   Procedure: CARDIOVERSION;  Surgeon: Thayer Headings, MD;  Location: [place];  Service: Cardiovascular;  Laterality: N/A;  . [place]; 1978  . LAPAROSCOPIC PARTIAL COLECTOMY N/A 05/04/2015   Procedure: LAPAROSCOPIC LOW ANTERIOR RESECTION LAPAROSCOPIC LYSIS OF ADHESIONS, SPLENIC FLEXURE MOBILIZATION;  Surgeon: Michael Boston, MD;  Location: [facility];  Service: General;  Laterality: N/A;  . LUNG REMOVAL, PARTIAL Left 02/1965   LLL  . TOTAL ABDOMINAL HYSTERECTOMY W/ BILATERAL SALPINGOOPHORECTOMY  1984   Endometriosis    Current Outpatient Medications  Medication Sig Dispense Refill  . acetaminophen (TYLENOL) 650 MG CR tablet Take 1,300 mg by mouth 2 (two) times daily as needed for pain.     [name] ALPRAZolam (XANAX) 0.25 MG tablet Take 0.25 mg by mouth at bedtime as needed for anxiety or sleep.    . Calcium Carb-Cholecalciferol (CALCIUM 600+D) 600-800 MG-UNIT TABS Take 1 tablet by mouth daily.    [name]  cyanocobalamin 2000 MCG tablet Take 1 tablet (2,000 mcg total) by mouth daily. 90 tablet 3  . dofetilide (TIKOSYN) 250 MCG capsule TAKE 1 CAPSULE BY MOUTH 2 TIMES DAILY 180 capsule 3  . ELIQUIS 5 MG TABS tablet TAKE 1 TABLET BY MOUTH 2 TIMES DAILY 180 tablet 2  . estradiol (VIVELLE-DOT) 0.05 MG/24HR patch apply 1 PATCH TO THE SKIN TWICE A WEEK 8 patch 12  . FLOVENT HFA 110 MCG/ACT inhaler INHALE 2 PUFFS INTO THE LUNGS 2 TIMES DAILY 12 g 5  . fluticasone (FLONASE) 50 MCG/ACT nasal spray USE 2 SPRAYS IN  EACH NOSTRIL 2 TIMES DAILY 16 g 11  . levalbuterol (XOPENEX HFA) 45 MCG/ACT inhaler inhale 1 TO 2 PUFFS BY MOUTH EVERY 4 HOURS AS NEEDED 15 g 3  . LIVALO 2 MG TABS TAKE 1 TABLET BY MOUTH EVERY DAY 90 tablet 1  . magnesium oxide (MAG-OX) 400 MG tablet Take 1 tablet (400 mg total) by mouth daily. 30 tablet 6  . montelukast (SINGULAIR) 10 MG tablet Take 1 tablet (10 mg total) by mouth daily. 30 tablet 11  . potassium chloride (K-DUR) 10 MEQ tablet TAKE 2 TABLETS BY MOUTH EVERY DAY 60 tablet 11   No current facility-administered medications for this visit.     Allergies:   Tramadol; Amoxicillin-pot clavulanate; Flagyl [metronidazole]; Flecainide; Omnicef [cefdinir]; and Avelox [moxifloxacin]   Social History:  The patient  reports that she has been smoking cigarettes. She has been smoking about 0.25 packs per day. She has never used smokeless tobacco. She reports current alcohol use of about 14.0 standard drinks of alcohol per week. She reports that she does not use drugs.   Family History:  The patient's family history includes Asthma in her child and maternal grandfather; Diabetes in her mother; Heart disease in her father; Hypertension in her father; Stroke (age of onset: 47) in her father.   ROS:  Please see the history of present illness.   All other systems are personally reviewed and negative.    Exam:    Vital Signs:  BP (!) 156/98   Pulse 87   Well appearing, alert and conversant, regular work of breathing,  good skin color Eyes- anicteric, neuro- grossly intact, skin- no apparent rash or lesions or cyanosis, mouth- oral mucosa is pink   Labs/Other Tests and Data Reviewed:    Recent Labs: 09/15/2017: Hemoglobin 14.4; Platelets 164.0; TSH <0.01 Repeated and verified X2. 11/24/2017: BUN 13; Creatinine, Ser 0.67; Magnesium 2.1; Potassium 4.2; Sodium 143   Wt Readings from Last 3 Encounters:  11/24/17 142 lb (64.4 kg)  09/22/17 140 lb (63.5 kg)  09/15/17 141 lb (64 kg)      Other studies personally reviewed: Additional studies/ records that were reviewed today include: prior office notes  Review of the above records today demonstrates: as above     ASSESSMENT & PLAN:    1.  Paroxysmal atrial fibrillation Doing well without clinical recurrence of AF Continue Tikosyn Continue Eliquis for CHADS2VASC of 3  2.  Post termination pauses Resolved post ablation  3.  HTN BP elevated today but she hasn't been checking frequently I have advised her to check her blood pressure daily for the next 2 weeks and let us know what her readings are.   4.  COVID 19 screen The patient denies symptoms of COVID 19 at this time.  The importance of social distancing was discussed today.  Follow-up:  4 months with AF clinic   Current medicines are  reviewed at length with the patient today.   The patient does not have concerns regarding her medicines.  The following changes were made today:  none  Labs/ tests ordered today include: none No orders of the defined types were placed in this encounter.    Patient Risk:  after full review of this patients clinical status, I feel that they are at moderate risk at this time.  Today, I have spent 15 minutes with the patient with telehealth technology discussing AF and hypertension.    SignedThompson Grayer, MD 06/22/2018 2:21 PM   [place] [address] [phone] (office) [phone] (fax)

## 2018-07-02 ENCOUNTER — Ambulatory Visit: Payer: Medicare Other | Admitting: Internal Medicine

## 2018-07-07 ENCOUNTER — Other Ambulatory Visit: Payer: Self-pay | Admitting: Internal Medicine

## 2018-07-07 MED ORDER — FLUTICASONE PROPIONATE 50 MCG/ACT NA SUSP: NASAL | 5 refills | Status: DC

## 2018-07-07 MED ORDER — FLUTICASONE PROPIONATE HFA 110 MCG/ACT IN AERO: INHALATION_SPRAY | RESPIRATORY_TRACT | 5 refills | Status: DC

## 2018-07-08 MED ORDER — LOSARTAN POTASSIUM 25 MG PO TABS: 25.0000 mg | ORAL_TABLET | Freq: Every day | ORAL | 3 refills | Status: DC

## 2018-07-28 ENCOUNTER — Other Ambulatory Visit: Payer: Self-pay | Admitting: Internal Medicine

## 2018-07-28 MED ORDER — MONTELUKAST SODIUM 10 MG PO TABS: 10.0000 mg | ORAL_TABLET | Freq: Every day | ORAL | 0 refills | Status: DC

## 2018-08-18 ENCOUNTER — Ambulatory Visit: Payer: Medicare Other | Admitting: Internal Medicine

## 2018-08-26 ENCOUNTER — Other Ambulatory Visit: Payer: Self-pay | Admitting: Internal Medicine

## 2018-08-27 ENCOUNTER — Telehealth: Payer: Self-pay | Admitting: Internal Medicine

## 2018-08-27 ENCOUNTER — Other Ambulatory Visit: Payer: Self-pay | Admitting: Internal Medicine

## 2018-08-27 MED ORDER — MONTELUKAST SODIUM 10 MG PO TABS: 10.0000 mg | ORAL_TABLET | Freq: Every day | ORAL | 0 refills | Status: DC

## 2018-08-27 NOTE — Telephone Encounter (Signed)
Pt does have a follow up appt scheduled with MW 7/15. Called and spoke with pt letting her know that I was going to send in an Rx of singulair to pharmacy but not adding any refills. Stated to her at [facility] could take care of adding refills of medication for her. Pt verbalized understanding. Rx sent in for pt. Nothing further needed.

## 2018-08-28 ENCOUNTER — Ambulatory Visit: Payer: Medicare Other | Admitting: Internal Medicine

## 2018-09-07 ENCOUNTER — Ambulatory Visit: Payer: Medicare Other | Admitting: Internal Medicine

## 2018-09-16 ENCOUNTER — Other Ambulatory Visit: Payer: Self-pay

## 2018-09-16 ENCOUNTER — Encounter: Payer: Self-pay | Admitting: Internal Medicine

## 2018-09-16 ENCOUNTER — Ambulatory Visit (INDEPENDENT_AMBULATORY_CARE_PROVIDER_SITE_OTHER): Payer: Medicare Other | Admitting: Internal Medicine

## 2018-09-16 DIAGNOSIS — F17210 Nicotine dependence, cigarettes, uncomplicated: Secondary | ICD-10-CM | POA: Diagnosis not present

## 2018-09-16 DIAGNOSIS — J4540 Moderate persistent asthma, uncomplicated: Secondary | ICD-10-CM

## 2018-09-16 MED ORDER — MONTELUKAST SODIUM 10 MG PO TABS: 10.0000 mg | ORAL_TABLET | Freq: Every day | ORAL | 3 refills | Status: DC

## 2018-09-16 NOTE — Assessment & Plan Note (Signed)

## 2018-09-16 NOTE — Patient Instructions (Signed)
No change in medications   [facility] office might be an option for you   Please schedule a follow up visit in 12 months but call sooner if needed

## 2018-09-16 NOTE — Progress Notes (Signed)
Subjective:   Patient ID: Elizabeth Mcconnell, female    DOB: 03-09-1945    MRN: [id number]  Brief patient profile:  76 yowf active smoker with "asthma all her life" previous pt of Dr Joya Gaskins and  Ashok Cordia with moderate asthma criteria vs copd GOLD II as of pfts 07/01/2017       History of Present Illness  03/25/2016 acute extended ov/Elizabeth Mcconnell re:  Chronic asthma maint flovent hfa ? Still smoking ?  Chief Complaint  Patient presents with  . Acute Visit    Pt c/o chest and sinus congestion for the past 2 wks. She feels very tired and states that she has chills "all the time"- no fever. She has had a prod cough with grey sputum.    baseline doing fine on  flovent hfa / singulair not needing any rescue with little flare of cough attributes to  Uri dec 2017 took doxy and all better until 2 weeks prior to OV with cough thick grey mucus x 2 weeks worse in ams with lots of sinus congestion and grey mucus, some chills but no fever / sweats - even with flare not using xopenex and not sure how much she can "maybe 2 pffs a day"  rec Doxycycline 100 mg twice daily x 10 days Prednisone 10 mg take  4 each am x 2 days,   2 each am x 2 days,  1 each am x 2 days and stop  Work on inhaler technique: For cough / congestion mucinex 1200 mg every 12 hours as needed and use the xopenex hfa more regularly to improve the mucus flow     02/19/2017 acute extended ov/Elizabeth Mcconnell re: acute cough  Chief Complaint  Patient presents with  . Acute Visit    Pt c/o cough for the past 3 days. She also c/o tightness in her throat and upper chest. She has had fatigue and chills also.   maint on flovent 2bid and rarely xopenex only a few times a day even when flare for cough or chest tightness assoc with nasal congestion but no sob rec Doxycycline 100 mg twice daily x 10 days Prednisone 10 mg take  4 each am x 2 days,   2 each am x 2 days,  1 each am x 2 days and stop  Work on inhaler technique:   For cough / congestion mucinex  Or  mucinex dm 1200 mg every 12 hours as needed and use the xopenex hfa more regularly to improve the mucus flow  And use Tylenol #3 as needed when can afford to be sleepy Try prilosec otc 20mg   Take 30-60 min before first meal of the day and Pepcid ac (famotidine) 20 mg one @  bedtime until cough is completely gone for at least a week without the need for cough suppression GERD  Diet      04/02/2017  f/u ov/Elizabeth Mcconnell re: moderate asthma/ cough  Chief Complaint  Patient presents with  . Follow-up    Cough had improved, but then worsened again 4 days ago. She feels some tightness in her throat. Cough has been prod with thick, grey sputum.  She is also having some SOB since cough started back.  She has used her xopenex for rescue a few times since she started feeling bad again.   was all better then acutely worse  since 03/30/17 with nasal congestion/ sneezing then cough/ wheeze and sob  Dyspnea:  Not needing xopenex at all at baseline / only twice  daily since acutely ill and not sob at rest p xopenex prn Cough: grey mucus/ worse at hs  Sleep: after xanax does fine/ did not do well with Tyl #3  rec Doxycycline 100 mg twice daily x 10 days Prednisone 10 mg take  4 each am x 2 days,   2 each am x 2 days,  1 each am x 2 days and stop  For cough / congestion mucinex  Or mucinex dm 1200 mg every 12 hours as needed and use the xopenex hfa more regularly to improve the mucus flow   - up to 2 puffs every 4 hours as needed  Try prilosec otc 20mg   Take 30-60 min before first meal of the day and Pepcid ac (famotidine) 20 mg one @  bedtime until cough is completely gone for at least a week without the need for cough suppression    07/01/2017  f/u ov/Elizabeth Mcconnell re  moderate asthma  Lifelong maint on flovent 110 one bid  Chief Complaint  Patient presents with  . Follow-up    PFT's done today. Breathing is doing well and not coughing at this time. She rarely uses her xopenex.   Dyspnea:  MMRC1 = can walk nl pace, flat  grade, can't hurry or go uphills or steps s sob   Cough: gone Sleep: flat   rec The key is to stop smoking completely before smoking completely stops you!  Schedule a follow up visit in 12  months but call sooner if needed     09/16/2018  f/u ov/Elizabeth Mcconnell re:  Chief Complaint  Patient presents with  . Asthma    Doing well since last year visit.  Dyspnea:  MMRC1 = can walk nl pace, flat grade, can't hurry or go uphills or steps s sob  Still walking the neighborhood at [facility]  Cough: none  Sleeping: able to lie down flat  SABA use: rarely need saba  02: none    No obvious day to day or daytime variability or assoc excess/ purulent sputum or mucus plugs or hemoptysis or cp or chest tightness, subjective wheeze or overt sinus or hb symptoms.   Sleeping as above without nocturnal  or early am exacerbation  of respiratory  c/o's or need for noct saba. Also denies any obvious fluctuation of symptoms with weather or environmental changes or other aggravating or alleviating factors except as outlined above   No unusual exposure hx or h/o childhood pna/ asthma or knowledge of premature birth.  Current Allergies, Complete Past Medical History, Past Surgical History, Family History, and Social History were reviewed in [employer] record.  ROS  The following are not active complaints unless bolded Hoarseness, sore throat, dysphagia, dental problems, itching, sneezing,  nasal congestion or discharge of excess mucus or purulent secretions, ear ache,   fever, chills, sweats, unintended wt loss or wt gain, classically pleuritic or exertional cp,  orthopnea pnd or arm/hand swelling  or leg swelling, presyncope, palpitations, abdominal pain, anorexia, nausea, vomiting, diarrhea  or change in bowel habits or change in bladder habits, change in stools or change in urine, dysuria, hematuria,  rash, arthralgias, visual complaints, headache, numbness, weakness or ataxia or problems with  walking or coordination,  change in mood or  memory.        Current Meds  Medication Sig  . acetaminophen (TYLENOL) 650 MG CR tablet Take 1,300 mg by mouth 2 (two) times daily as needed for pain.   [name] ALPRAZolam (XANAX) 0.25  MG tablet Take 0.25 mg by mouth at bedtime as needed for anxiety or sleep.  . Calcium Carb-Cholecalciferol (CALCIUM 600+D) 600-800 MG-UNIT TABS Take 1 tablet by mouth daily.  . cyanocobalamin 2000 MCG tablet Take 1 tablet (2,000 mcg total) by mouth daily.  [name] dofetilide (TIKOSYN) 250 MCG capsule TAKE 1 CAPSULE BY MOUTH 2 TIMES DAILY  . ELIQUIS 5 MG TABS tablet TAKE 1 TABLET BY MOUTH 2 TIMES DAILY  . estradiol (VIVELLE-DOT) 0.05 MG/24HR patch apply 1 PATCH TO THE SKIN TWICE A WEEK  . fluticasone (FLONASE) 50 MCG/ACT nasal spray USE 2 SPRAYS IN EACH NOSTRIL 2 TIMES DAILY  . fluticasone (FLOVENT HFA) 110 MCG/ACT inhaler INHALE 2 PUFFS INTO THE LUNGS 2 TIMES DAILY  . levalbuterol (XOPENEX HFA) 45 MCG/ACT inhaler inhale 1 TO 2 PUFFS BY MOUTH EVERY 4 HOURS AS NEEDED  . LIVALO 2 MG TABS TAKE 1 TABLET BY MOUTH EVERY DAY  . losartan (COZAAR) 25 MG tablet Take 1 tablet (25 mg total) by mouth daily.  . magnesium oxide (MAG-OX) 400 MG tablet Take 1 tablet (400 mg total) by mouth daily.  . montelukast (SINGULAIR) 10 MG tablet Take 1 tablet (10 mg total) by mouth daily.  . potassium chloride (K-DUR) 10 MEQ tablet TAKE 2 TABLETS BY MOUTH EVERY DAY                             Objective:   Physical Exam    09/16/2018         151 07/01/2017        140  02/19/2017      147   03/25/16 156 lb 12.8 oz (71.1 kg)  02/12/16 155 lb 6.4 oz (70.5 kg)  11/28/15 150 lb 12 oz (68.4 kg)     Vital signs reviewed - Note on arrival 02 sats  98% on RA   HEENT: nl dentition, turbinates bilaterally, and oropharynx. Nl external ear canals without cough reflex   NECK :  without JVD/Nodes/TM/ nl carotid upstrokes bilaterally   LUNGS: no acc muscle use,  Nl contour chest which is  clear to A and P bilaterally without cough on insp or exp maneuvers   CV:  RRR  no s3 or murmur or increase in P2, and no edema   ABD:  soft and nontender with nl inspiratory excursion in the supine position. No bruits or organomegaly appreciated, bowel sounds nl  MS:  Nl gait/ ext warm without deformities, calf tenderness, cyanosis or clubbing No obvious joint restrictions   SKIN: warm and dry without lesions    NEURO:  alert, approp, nl sensorium with  no motor or cerebellar deficits apparent.            Assessment & Plan:

## 2018-09-16 NOTE — Assessment & Plan Note (Addendum)
Onset all her life Spirometry  09/25/15 FEV1 1.58  (70%)  Ratio 55 on flovent 110 2bid  PFT  09/25/15:  FEV1 1.58 L (70%) FEV1/FVC 0.55   73% DLCO uncorrected 58% - PFT's  07/01/2017  FEV1 1.71 (77 % ) ratio 62  p 6 % improvement from saba p only flovent 110 prior to study with DLCO  60 % corrects to 72  % for alv volume   - 07/01/2017  After extensive coaching inhaler device  effectiveness =    90%    Despite ongoing smoking >  All goals of chronic asthma control met including optimal function and elimination of symptoms with minimal need for rescue therapy.  Contingencies discussed in full including contacting this office immediately if not controlling the symptoms using the rule of two's.      Advised:  formulary restrictions will be an ongoing challenge for the forseable future and I would be happy to pick an alternative if the pt will first  provide me a list of them -  pt  will need to return here for training for any new device that is required eg dpi vs hfa vs respimat.    In the meantime we can always provide samples so that the patient never runs out of any needed respiratory medications.

## 2018-09-23 ENCOUNTER — Other Ambulatory Visit: Payer: Self-pay | Admitting: Gynecology

## 2018-09-23 ENCOUNTER — Telehealth: Payer: Self-pay | Admitting: Internal Medicine

## 2018-09-28 ENCOUNTER — Other Ambulatory Visit: Payer: Self-pay | Admitting: Internal Medicine

## 2018-09-28 DIAGNOSIS — F411 Generalized anxiety disorder: Secondary | ICD-10-CM

## 2018-09-28 MED ORDER — ALPRAZOLAM 0.25 MG PO TABS: 0.2500 mg | ORAL_TABLET | Freq: Every evening | ORAL | 0 refills | Status: DC | PRN

## 2018-09-28 NOTE — Telephone Encounter (Signed)
Pt has appt scheduled for 08/12. Okay to refill alprazolam? Please advise and I will inform pt of same.

## 2018-09-28 NOTE — Telephone Encounter (Addendum)
Pt has scheduled for 8/12.  She would like to know if med can get filled up to that date.  Pt states she is leaving in the morning at 6am.  States she will be gone for 2 weeks.  Would like sent to [facility] this evening.

## 2018-10-14 ENCOUNTER — Encounter: Payer: Self-pay | Admitting: Internal Medicine

## 2018-10-14 ENCOUNTER — Ambulatory Visit (INDEPENDENT_AMBULATORY_CARE_PROVIDER_SITE_OTHER): Payer: Medicare Other | Admitting: *Deleted

## 2018-10-14 ENCOUNTER — Other Ambulatory Visit (INDEPENDENT_AMBULATORY_CARE_PROVIDER_SITE_OTHER): Payer: Medicare Other

## 2018-10-14 ENCOUNTER — Other Ambulatory Visit: Payer: Self-pay

## 2018-10-14 ENCOUNTER — Ambulatory Visit (INDEPENDENT_AMBULATORY_CARE_PROVIDER_SITE_OTHER): Payer: Medicare Other | Admitting: Internal Medicine

## 2018-10-14 VITALS — BP 148/92 | HR 102 | Ht 65.0 in | Wt 151.0 lb

## 2018-10-14 VITALS — BP 152/96 | HR 102 | Temp 98.0°F | Resp 16 | Ht 65.0 in | Wt 151.0 lb

## 2018-10-14 DIAGNOSIS — E785 Hyperlipidemia, unspecified: Secondary | ICD-10-CM

## 2018-10-14 DIAGNOSIS — I480 Paroxysmal atrial fibrillation: Secondary | ICD-10-CM

## 2018-10-14 DIAGNOSIS — Z0000 Encounter for general adult medical examination without abnormal findings: Secondary | ICD-10-CM | POA: Diagnosis not present

## 2018-10-14 DIAGNOSIS — E538 Deficiency of other specified B group vitamins: Secondary | ICD-10-CM | POA: Diagnosis not present

## 2018-10-14 DIAGNOSIS — Z0001 Encounter for general adult medical examination with abnormal findings: Secondary | ICD-10-CM | POA: Diagnosis not present

## 2018-10-14 DIAGNOSIS — R7989 Other specified abnormal findings of blood chemistry: Secondary | ICD-10-CM

## 2018-10-14 DIAGNOSIS — Z23 Encounter for immunization: Secondary | ICD-10-CM

## 2018-10-14 DIAGNOSIS — I10 Essential (primary) hypertension: Secondary | ICD-10-CM

## 2018-10-14 DIAGNOSIS — Z1231 Encounter for screening mammogram for malignant neoplasm of breast: Secondary | ICD-10-CM

## 2018-10-14 LAB — BASIC METABOLIC PANEL
BUN: 11 mg/dL (ref 6–23)
CO2: 27 mEq/L (ref 19–32)
Calcium: 10.1 mg/dL (ref 8.4–10.5)
Chloride: 101 mEq/L (ref 96–112)
Creatinine, Ser: 0.8 mg/dL (ref 0.40–1.20)
GFR: 69.92 mL/min (ref >60.00)
Glucose, Bld: 93 mg/dL (ref 70–99)
Potassium: 4.6 mEq/L (ref 3.5–5.1)
Sodium: 137 mEq/L (ref 135–145)

## 2018-10-14 LAB — CBC WITH DIFFERENTIAL/PLATELET
Basophils Absolute: 0.1 10*3/uL (ref 0.0–0.1)
Basophils Relative: 0.6 % (ref 0.0–3.0)
Eosinophils Absolute: 0.2 10*3/uL (ref 0.0–0.7)
Eosinophils Relative: 2 % (ref 0.0–5.0)
HCT: 42.9 % (ref 36.0–46.0)
Hemoglobin: 14.1 g/dL (ref 12.0–15.0)
Lymphocytes Relative: 25 % (ref 12.0–46.0)
Lymphs Abs: 2.1 10*3/uL (ref 0.7–4.0)
MCHC: 32.9 g/dL (ref 30.0–36.0)
MCV: 91.6 fl (ref 78.0–100.0)
Monocytes Absolute: 0.6 10*3/uL (ref 0.1–1.0)
Monocytes Relative: 7 % (ref 3.0–12.0)
Neutro Abs: 5.5 10*3/uL (ref 1.4–7.7)
Neutrophils Relative %: 65.4 % (ref 43.0–77.0)
Platelets: 185 10*3/uL (ref 150.0–400.0)
RBC: 4.69 Mil/uL (ref 3.87–5.11)
RDW: 14 % (ref 11.5–15.5)
WBC: 8.4 10*3/uL (ref 4.0–10.5)

## 2018-10-14 LAB — LIPID PANEL
Cholesterol: 195 mg/dL (ref 0–200)
HDL: 75.5 mg/dL (ref >39.00)
LDL Cholesterol: 79 mg/dL (ref 0–99)
NonHDL: 119.3
Total CHOL/HDL Ratio: 3
Triglycerides: 200 mg/dL — ABNORMAL HIGH (ref 0.0–149.0)
VLDL: 40 mg/dL (ref 0.0–40.0)

## 2018-10-14 LAB — HEPATIC FUNCTION PANEL
ALT: 12 U/L (ref 0–35)
AST: 16 U/L (ref 0–37)
Albumin: 4.8 g/dL (ref 3.5–5.2)
Alkaline Phosphatase: 85 U/L (ref 39–117)
Bilirubin, Direct: 0.1 mg/dL (ref 0.0–0.3)
Total Bilirubin: 0.9 mg/dL (ref 0.2–1.2)
Total Protein: 7.7 g/dL (ref 6.0–8.3)

## 2018-10-14 LAB — VITAMIN B12: Vitamin B-12: 1430 pg/mL — ABNORMAL HIGH (ref 211–911)

## 2018-10-14 LAB — FOLATE: Folate: 13 ng/mL (ref >5.9)

## 2018-10-14 LAB — VITAMIN D 25 HYDROXY (VIT D DEFICIENCY, FRACTURES): VITD: 49.9 ng/mL (ref 30.00–100.00)

## 2018-10-14 MED ORDER — SHINGRIX 50 MCG/0.5ML IM SUSR: 0.5000 mL | Freq: Once | INTRAMUSCULAR | 1 refills | Status: DC

## 2018-10-14 NOTE — Progress Notes (Addendum)
Subjective:   Corrisa Gibby is a 73 y.o. female who presents for Medicare Annual (Subsequent) preventive examination.  Review of Systems:   Cardiac Risk Factors include: advanced age (>53men, >23 women);dyslipidemia Sleep patterns: no sleep issues, gets up 1 times nightly to void and sleeps 6-7 hours nightly.    Home Safety/Smoke Alarms: Feels safe in home. Smoke alarms in place.  Living environment; residence and Firearm Safety: 2-story house. Lives alone, no needs for DME, good support system. Seat Belt Safety/Bike Helmet: Wears seat belt.      Objective:     Vitals: There were no vitals taken for this visit.  There is no height or weight on file to calculate BMI.  Advanced Directives 10/14/2018 08/22/2017 11/13/2016 11/11/2016 11/05/2016 05/10/2016 05/01/2015  Does Patient Have a Medical Advance Directive? Yes Yes - Yes Yes Yes Yes  Type of Advance Directive [place];Living will [place];Living will [place];Living will Healthcare Power of [place] of [place];Living will Living will;Healthcare Power of Attorney  Does patient want to make changes to medical advance directive? - - No - Patient declined - No - Patient declined No - Patient declined No - Patient declined  Copy of [place] in Chart? No - copy requested No - copy requested No - copy requested - No - copy requested No - copy requested No - copy requested  Would patient like information on creating a medical advance directive? - - - - - - -  Pre-existing out of facility DNR order (yellow form or pink MOST form) - - - - - - -    Tobacco Social History   Tobacco Use  Smoking Status Current Every Day Smoker  . Packs/day: 0.25  . Types: Cigarettes  Smokeless Tobacco Never Used  Tobacco Comment   smokes only 1-2 cigarettes intermittently      Ready to quit: Not Answered Counseling given: Not  Answered Comment: smokes only 1-2 cigarettes intermittently   Past Medical History:  Diagnosis Date  . Adenomatous polyp of colon 10/2013   f/u colo 10/2018  . Allergic rhinitis   . ALLERGIC RHINITIS   . ANXIETY    "situational" (02/14/2015)  . Arthritis    "neck, back, hands" (02/14/2015)  . Asthma   . Atrial fibrillation with RVR ([place])   . Atrial flutter ([place])    failed cardioversion 11/2012; s/p ablation 02-02-2013 by Dr Rayann Heman  . Chronic bronchitis ([place])    "I'll have it most years" (02/14/2015)  . Diverticulosis   . Endometriosis   . Essential hypertension 05/30/2014  . Hepatic steatosis   . Oral candidiasis   . Osteopenia 08/2015   T score -1.9 FRAX 16% / 6.5%  . Pneumothorax on left 1996   left lower lung collapse s/p resection  . Small bowel obstruction ([place])   . Tobacco abuse    Past Surgical History:  Procedure Laterality Date  . ABDOMINAL HYSTERECTOMY    . APPENDECTOMY  1978  . ATRIAL FIBRILLATION ABLATION N/A 11/05/2016   Procedure: Atrial Fibrillation Ablation;  Surgeon: Thompson Grayer, MD;  Location: [facility];  Service: Cardiovascular;  Laterality: N/A;  . ATRIAL FLUTTER ABLATION N/A 02/02/2013   Procedure: ATRIAL FLUTTER ABLATION;  Surgeon: Coralyn Mark, MD;  Location: [facility];  Service: Cardiovascular;  Laterality: N/A;  . CARDIOVERSION N/A 11/27/2012   Procedure: CARDIOVERSION;  Surgeon: Thayer Headings, MD;  Location: [facility];  Service: Cardiovascular;  Laterality: N/A;  . [place]; 1978  . LAPAROSCOPIC PARTIAL COLECTOMY N/A 05/04/2015   Procedure: LAPAROSCOPIC LOW ANTERIOR RESECTION LAPAROSCOPIC LYSIS OF ADHESIONS, SPLENIC FLEXURE MOBILIZATION;  Surgeon: Michael Boston, MD;  Location: [facility];  Service: General;  Laterality: N/A;  . LUNG REMOVAL, PARTIAL Left 02/1965   LLL  . TOTAL ABDOMINAL HYSTERECTOMY W/ BILATERAL SALPINGOOPHORECTOMY  1984   Endometriosis   Family History  Problem Relation Age of Onset  . Asthma Maternal  Grandfather   . Stroke Father 31  . Hypertension Father   . Heart disease Father   . Diabetes Mother   . Asthma Child   . Colon cancer Neg Hx   . Rectal cancer Neg Hx   . Stomach cancer Neg Hx    Social History   Socioeconomic History  . Marital status: Widowed    Spouse name: Not on file  . Number of children: 2  . Years of education: Not on file  . Highest education level: Not on file  Occupational History  . 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 Financial resource strain: Not hard at all  . Food insecurity    Worry: Never true    Inability: Never true  . Transportation needs    Medical: No    Non-medical: No  Tobacco Use  . Smoking status: Current Every Day Smoker    Packs/day: 0.25    Types: Cigarettes  . Smokeless tobacco: Never Used  . Tobacco comment: smokes only 1-2 cigarettes intermittently   Substance and Sexual Activity  . Alcohol use: Yes    Alcohol/week: 14.0 standard drinks    Types: 14 Standard drinks or equivalent per week    Comment: daily Shantele Reller  . Drug use: No  . Sexual activity: Not Currently    Birth control/protection: Surgical, Post-menopausal    Comment: HYST-1st intercourse 73 yo-Fewer than 5 partners  Lifestyle  . Physical activity    Days per week: 3 days    Minutes per session: 50 min  . Stress: Only a little  Relationships  . Social connections    Talks on phone: More than three times a week    Gets together: More than three times a week    Attends religious service: More than 4 times per year    Active member of club or organization: Yes    Attends meetings of clubs or organizations: More than 4 times per year    Relationship status: Widowed  Other Topics Concern  . Not on file  Social History Narrative   Lives with spouse;    2 children      [place] [facility]:   She is from [facility]. 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  Previously has attended business school. Previously did office work and Programmer, applications at [employer]. She has also worked for [employer]. She does have a cat currently. She has indoor plants. No mold exposure.    Outpatient Encounter Medications as of 10/14/2018  Medication Sig  . acetaminophen (TYLENOL) 650 MG CR tablet Take 1,300 mg by mouth 2 (two) times daily as needed for pain.   [name] ALPRAZolam (XANAX) 0.25 MG tablet Take 1 tablet (0.25 mg total) by mouth at bedtime as needed for anxiety or sleep.  . Calcium Carb-Cholecalciferol (CALCIUM 600+D) 600-800 MG-UNIT TABS Take 1 tablet by mouth daily.  [name]  cyanocobalamin 2000 MCG tablet Take 1 tablet (2,000 mcg total) by mouth daily.  [name] dofetilide (TIKOSYN) 250 MCG capsule TAKE 1 CAPSULE BY MOUTH 2 TIMES DAILY  . ELIQUIS 5 MG TABS tablet TAKE 1 TABLET BY MOUTH 2 TIMES DAILY  . estradiol (VIVELLE-DOT) 0.05 MG/24HR patch apply 1 PATCH TO THE SKIN TWICE A WEEK  . fluticasone (FLONASE) 50 MCG/ACT nasal spray USE 2 SPRAYS IN EACH NOSTRIL 2 TIMES DAILY  . fluticasone (FLOVENT HFA) 110 MCG/ACT inhaler INHALE 2 PUFFS INTO THE LUNGS 2 TIMES DAILY  . levalbuterol (XOPENEX HFA) 45 MCG/ACT inhaler inhale 1 TO 2 PUFFS BY MOUTH EVERY 4 HOURS AS NEEDED  . LIVALO 2 MG TABS TAKE 1 TABLET BY MOUTH EVERY DAY  . magnesium oxide (MAG-OX) 400 MG tablet Take 1 tablet (400 mg total) by mouth daily.  . montelukast (SINGULAIR) 10 MG tablet Take 1 tablet (10 mg total) by mouth daily.  . potassium chloride (K-DUR) 10 MEQ tablet TAKE 2 TABLETS BY MOUTH EVERY DAY  . Zoster Vaccine Adjuvanted [facility] injection Inject 0.5 mLs into the muscle once for 1 dose.  . losartan (COZAAR) 25 MG tablet Take 1 tablet (25 mg total) by mouth daily.   No facility-administered encounter medications on file as of 10/14/2018.     Activities of Daily Living In your present state of health, do you have any difficulty performing the following activities: 10/14/2018  Hearing? N  Vision? N   Difficulty concentrating or making decisions? N  Walking or climbing stairs? N  Dressing or bathing? N  Doing errands, shopping? N  Preparing Food and eating ? N  Using the Toilet? N  In the past six months, have you accidently leaked urine? N  Do you have problems with loss of bowel control? N  Managing your Medications? N  Managing your Finances? N  Housekeeping or managing your Housekeeping? N  Some recent data might be hidden    Patient Care Team: Janith Lima, MD as PCP - General (Internal Medicine) Thompson Grayer, MD (Cardiology) Sherren Mocha, MD (Cardiology) Phineas Real Belinda Block, MD (Gynecology) Pyrtle, Lajuan Lines, MD as Consulting Physician (Gastroenterology)    Assessment:   This is a routine wellness examination for Elizabeth Mcconnell. Physical assessment deferred to PCP.   Exercise Activities and Dietary recommendations Current Exercise Habits: Home exercise routine, Type of exercise: walking(swim classes), Time (Minutes): 50, Frequency (Times/Week): 3, Weekly Exercise (Minutes/Week): 150, Intensity: Mild, Exercise limited by: orthopedic condition(s) Diet (meal preparation, eat out, water intake, caffeinated beverages, dairy products, fruits and vegetables): in general, a "healthy" diet  , well balanced   Reviewed heart healthy diet. Encouraged patient to increase daily water and healthy fluid intake.  Goals      Patient Stated   . patient centered (pt-stated)     Wants to keep moving; staying active Continue with pool work      Other   . Patient Stated     In the afternoon take time for me to do my crafts and get prepared for the upcoming craft shows.     . Patient Stated     Continue to maintain current health status and stay active both socially and physically.       Fall Risk Fall Risk  10/14/2018 09/17/2017 08/22/2017 09/30/2014 06/13/2014  Falls in the past year? 0 No No No No  Number falls in past yr: 0 - - - -  Injury with Fall? 0 - - - -   Depression  Screen PHQ 2/9 Scores 10/14/2018 09/17/2017 08/22/2017 09/30/2014  PHQ - 2 Score 1 0 2 0  PHQ- 9 Score - - 3 -     Cognitive Function       Ad8 score reviewed for issues:  Issues making decisions: no  Less interest in hobbies / activities: no  Repeats questions, stories (family complaining): no  Trouble using ordinary gadgets (microwave, computer, phone):no  Forgets the month or year: no  Mismanaging finances: no  Remembering appts: no  Daily problems with thinking and/or memory: no Ad8 score is= 0  Immunization History  Administered Date(s) Administered  . DTaP 04/19/2012  . Influenza Split 12/03/2010  . Influenza Whole 12/02/2008, 12/20/2011  . Influenza, High Dose Seasonal PF 11/18/2015, 12/11/2016, 11/24/2017  . Influenza,inj,Quad PF,6+ Mos 11/17/2012  . Influenza-Unspecified 01/03/2014, 11/11/2014  . Pneumococcal Conjugate-13 06/13/2014  . Pneumococcal Polysaccharide-23 12/03/2007, 04/19/2012, 10/14/2018  . Tdap 04/07/2012  . Zoster 03/04/1998   Screening Tests Health Maintenance  Topic Date Due  . Hepatitis C Screening  [birth year]  . INFLUENZA VACCINE  06/03/2019 (Originally 10/03/2018)  . COLONOSCOPY  10/28/2018  . TETANUS/TDAP  04/07/2022  . DEXA SCAN  Completed  . PNA vac Low Risk Adult  Completed  . PAP SMEAR-Modifier  Discontinued      Plan:    Reviewed health maintenance screenings with patient today and relevant education, vaccines, and/or referrals were provided.   I have personally reviewed and noted the following in the patient's chart:   . Medical and social history . Use of alcohol, tobacco or illicit drugs  . Current medications and supplements . Functional ability and status . Nutritional status . Physical activity . Advanced directives . List of other physicians . Vitals . Screenings to include cognitive, depression, and falls . Referrals and appointments  In addition, I have reviewed and discussed with patient certain preventive  protocols, quality metrics, and best practice recommendations. A written personalized care plan for preventive services as well as general preventive health recommendations were provided to patient.     Michiel Cowboy, RN  10/14/2018  Medical screening examination/treatment/procedure(s) were performed by non-physician practitioner and as supervising physician I was immediately available for consultation/collaboration. I agree with above. [name], MD

## 2018-10-14 NOTE — Progress Notes (Signed)
Subjective:  Patient ID: Elizabeth Mcconnell, female    DOB: 08/22/1945  Age: 73 y.o. MRN: [id number]  CC: Annual Exam, Hypertension, Hyperlipidemia, and Atrial Fibrillation   HPI Elizabeth Mcconnell presents for a CPX.  She tells me she saw her cardiologist a few months ago and was found to have an elevated blood pressure.  She has been started on low-dose ARB but her blood pressure remains elevated.  She complains of anxiety but denies headache, blurred vision, chest pain, shortness of breath, palpitations, edema, or DOE.  Outpatient Medications Prior to Visit  Medication Sig Dispense Refill  . acetaminophen (TYLENOL) 650 MG CR tablet Take 1,300 mg by mouth 2 (two) times daily as needed for pain.     [name] ALPRAZolam (XANAX) 0.25 MG tablet Take 1 tablet (0.25 mg total) by mouth at bedtime as needed for anxiety or sleep. 30 tablet 0  . Calcium Carb-Cholecalciferol (CALCIUM 600+D) 600-800 MG-UNIT TABS Take 1 tablet by mouth daily.    [name] dofetilide (TIKOSYN) 250 MCG capsule TAKE 1 CAPSULE BY MOUTH 2 TIMES DAILY 180 capsule 3  . ELIQUIS 5 MG TABS tablet TAKE 1 TABLET BY MOUTH 2 TIMES DAILY 180 tablet 2  . estradiol (VIVELLE-DOT) 0.05 MG/24HR patch apply 1 PATCH TO THE SKIN TWICE A WEEK 8 patch 0  . fluticasone (FLONASE) 50 MCG/ACT nasal spray USE 2 SPRAYS IN EACH NOSTRIL 2 TIMES DAILY 16 g 5  . fluticasone (FLOVENT HFA) 110 MCG/ACT inhaler INHALE 2 PUFFS INTO THE LUNGS 2 TIMES DAILY 12 g 5  . levalbuterol (XOPENEX HFA) 45 MCG/ACT inhaler inhale 1 TO 2 PUFFS BY MOUTH EVERY 4 HOURS AS NEEDED 15 g 3  . LIVALO 2 MG TABS TAKE 1 TABLET BY MOUTH EVERY DAY 90 tablet 1  . magnesium oxide (MAG-OX) 400 MG tablet Take 1 tablet (400 mg total) by mouth daily. 30 tablet 6  . montelukast (SINGULAIR) 10 MG tablet Take 1 tablet (10 mg total) by mouth daily. 90 tablet 3  . potassium chloride (K-DUR) 10 MEQ tablet TAKE 2 TABLETS BY MOUTH EVERY DAY 60 tablet 11  . cyanocobalamin 2000 MCG tablet Take 1 tablet  (2,000 mcg total) by mouth daily. 90 tablet 3  . losartan (COZAAR) 25 MG tablet Take 1 tablet (25 mg total) by mouth daily. 90 tablet 3   No facility-administered medications prior to visit.     ROS Review of Systems  Constitutional: Negative for diaphoresis, fatigue and unexpected weight change.  HENT: Negative.   Eyes: Negative for visual disturbance.  Respiratory: Negative for cough, shortness of breath and wheezing.   Cardiovascular: Negative for chest pain, palpitations and leg swelling.  Gastrointestinal: Negative for abdominal pain, constipation, diarrhea, nausea and vomiting.  Endocrine: Negative.  Negative for cold intolerance and heat intolerance.  Genitourinary: Negative.  Negative for difficulty urinating, dysuria and hematuria.  Musculoskeletal: Negative.  Negative for arthralgias and myalgias.  Skin: Negative.  Negative for color change, pallor and rash.  Neurological: Negative.  Negative for dizziness, syncope, weakness, light-headedness and headaches.  Hematological: Negative for adenopathy. Does not bruise/bleed easily.  Psychiatric/Behavioral: Negative for confusion, decreased concentration, self-injury, sleep disturbance and suicidal ideas. The patient is nervous/anxious. The patient is not hyperactive.     Objective:  BP (!) 152/96 (BP Location: Left Arm, Patient Position: Sitting, Cuff Size: Normal)   Pulse (!) 102   Temp 98 F (36.7 C) (Oral)   Resp 16   Ht 5\' 5"  (1.651 m)   Wt 151  lb (68.5 kg)   SpO2 96%   BMI 25.13 kg/m   BP Readings from Last 3 Encounters:  10/14/18 (!) 148/92  10/14/18 (!) 152/96  09/16/18 130/72    Wt Readings from Last 3 Encounters:  10/14/18 151 lb (68.5 kg)  10/14/18 151 lb (68.5 kg)  09/16/18 151 lb 6.4 oz (68.7 kg)    Physical Exam Vitals signs reviewed.  Constitutional:      General: She is not in acute distress.    Appearance: She is not ill-appearing, toxic-appearing or diaphoretic.  HENT:     Nose: Nose normal.      Mouth/Throat:     Mouth: Mucous membranes are moist.  Eyes:     General: No scleral icterus.    Conjunctiva/sclera: Conjunctivae normal.  Neck:     Musculoskeletal: Normal range of motion. No neck rigidity.     Thyroid: No thyroid mass, thyromegaly or thyroid tenderness.  Cardiovascular:     Rate and Rhythm: Regular rhythm. Tachycardia present.     Heart sounds: No murmur.  Pulmonary:     Effort: Pulmonary effort is normal. No respiratory distress.     Breath sounds: No stridor. No wheezing, rhonchi or rales.  Abdominal:     General: Abdomen is flat. Bowel sounds are normal. There is no distension.     Palpations: There is no hepatomegaly, splenomegaly or mass.     Tenderness: There is no abdominal tenderness.  Genitourinary:    Comments: Breast, GU, rectal exams were deferred at her request. Musculoskeletal: Normal range of motion.     Right lower leg: No edema.     Left lower leg: No edema.  Lymphadenopathy:     Cervical: No cervical adenopathy.  Skin:    General: Skin is warm and dry.     Coloration: Skin is not pale.  Neurological:     General: No focal deficit present.  Psychiatric:        Attention and Perception: Attention and perception normal.        Mood and Affect: Mood is anxious. Mood is not depressed. Affect is blunt and angry. Affect is not labile, flat, tearful or inappropriate.        Speech: Speech normal.        Behavior: Behavior normal. Behavior is not agitated. Behavior is cooperative.        Thought Content: Thought content normal.        Cognition and Memory: Cognition normal.        Judgment: Judgment normal.     Lab Results  Component Value Date   WBC 8.4 10/14/2018   HGB 14.1 10/14/2018   HCT 42.9 10/14/2018   PLT 185.0 10/14/2018   GLUCOSE 93 10/14/2018   CHOL 195 10/14/2018   TRIG 200.0 (H) 10/14/2018   HDL 75.50 10/14/2018   LDLDIRECT 116.0 09/15/2017   LDLCALC 79 10/14/2018   ALT 12 10/14/2018   AST 16 10/14/2018   NA 137  10/14/2018   K 4.6 10/14/2018   CL 101 10/14/2018   CREATININE 0.80 10/14/2018   BUN 11 10/14/2018   CO2 27 10/14/2018   TSH 0.02 (L) 10/14/2018   INR 1.06 05/09/2016   HGBA1C 5.5 05/03/2015    No results found.  Assessment & Plan:   Elizabeth Mcconnell was seen today for annual exam, hypertension, hyperlipidemia and atrial fibrillation.  Diagnoses and all orders for this visit:  Essential hypertension- Her blood pressure is not adequately well controlled and she is tachycardic.  I recommended that she increase the dose of losartan and to start taking nebivolol. -     CBC with Differential/Platelet; Future -     VITAMIN D 25 Hydroxy (Vit-D Deficiency, Fractures); Future -     Basic metabolic panel; Future -     nebivolol (BYSTOLIC) 5 MG tablet; Take 1 tablet (5 mg total) by mouth daily. -     losartan (COZAAR) 100 MG tablet; Take 1 tablet (100 mg total) by mouth daily.  Paroxysmal atrial fibrillation ([place])- She has good rate and rhythm control.  Will continue anticoagulation with the DOAC.  B12 nutritional deficiency- Her B12 level is too high.  I recommended that she stop taking the B12 supplement. -     Vitamin B12; Future -     Folate; Future -     CBC with Differential/Platelet; Future  Hyperlipidemia with target LDL less than 160- She has achieved her LDL goal and is doing well on the statin. -     Lipid panel; Future -     Hepatic function panel; Future  Low TSH level -     Thyroid Panel With TSH; Future -     Ambulatory referral to Endocrinology  Routine general medical examination at a health care facility- Exam completed, labs reviewed, vaccines reviewed and updated, colon cancer screening and Pap are up-to-date, she is referred for screening mammogram, patient education material was given.  Need for shingles vaccine -     Zoster Vaccine Adjuvanted [facility]) injection; Inject 0.5 mLs into the muscle once for 1 dose.  Need for pneumococcal vaccination -      Pneumococcal polysaccharide vaccine 23-valent greater than or equal to 2yo subcutaneous/IM   I have discontinued Elizabeth Mcconnell [place]'s cyanocobalamin and losartan. I am also having her start on Shingrix, nebivolol, and losartan. Additionally, I am having her maintain her acetaminophen, magnesium oxide, Calcium Carb-Cholecalciferol, dofetilide, potassium chloride, Eliquis, Livalo, levalbuterol, fluticasone, fluticasone, montelukast, estradiol, and ALPRAZolam.  Meds ordered this encounter  Medications  . Zoster Vaccine Adjuvanted [facility]) injection    Sig: Inject 0.5 mLs into the muscle once for 1 dose.    Dispense:  0.5 mL    Refill:  1  . nebivolol (BYSTOLIC) 5 MG tablet    Sig: Take 1 tablet (5 mg total) by mouth daily.    Dispense:  90 tablet    Refill:  1  . losartan (COZAAR) 100 MG tablet    Sig: Take 1 tablet (100 mg total) by mouth daily.    Dispense:  90 tablet    Refill:  1     Follow-up: Return in about 6 months (around 04/16/2019).  [name], MD

## 2018-10-14 NOTE — Patient Instructions (Signed)

## 2018-10-14 NOTE — Patient Instructions (Addendum)
If you cannot attend class in person, you can still exercise at home. Video taped versions of AHOY classes are shown on [employer] (GTN) at 8 am and 1 pm Mondays through Fridays. You can also purchase a copy of the AHOY DVD by calling [facility] (GTN) [employer]. GTN is available on [employer] channel 13 with a digital cable box and on [employer] channel 31. GTN is also available on [employer], channel 99. To view GTN, go to channel 99, press OK, select [place], then select GTN to start the channel.  Continue to eat heart healthy diet (full of fruits, vegetables, whole grains, lean protein, water--limit salt, fat, and sugar intake) and increase physical activity as tolerated.  Continue doing brain stimulating activities (puzzles, reading, adult coloring books, staying active) to keep memory sharp.    Elizabeth Mcconnell , Thank you for taking time to come for your Medicare Wellness Visit. I appreciate your ongoing commitment to your health goals. Please review the following plan we discussed and let me know if I can assist you in the future.   These are the goals we discussed: Goals      Patient Stated   . patient centered (pt-stated)     Wants to keep moving; staying active Continue with pool work      Other   . Patient Stated     In the afternoon take time for me to do my crafts and get prepared for the upcoming craft shows.     . Patient Stated     Continue to maintain current health status and stay active both socially and physically.       This is a list of the screening recommended for you and due dates:  Health Maintenance  Topic Date Due  .  Hepatitis C: One time screening is recommended by Center for Disease Control  (CDC) for  adults born from [birth year] through 1967.   1945/06/02  . Flu Shot  06/03/2019*  . Colon Cancer Screening  10/28/2018  . Tetanus Vaccine  04/07/2022  . DEXA scan (bone density measurement)  Completed  .  Pneumonia vaccines  Completed  . Pap Smear  Discontinued  *Topic was postponed. The date shown is not the original due date.    Preventive Care 17 Years and Older, Female Preventive care refers to lifestyle choices and visits with your health care provider that can promote health and wellness. This includes:  A yearly physical exam. This is also called an annual well check.  Regular dental and eye exams.  Immunizations.  Screening for certain conditions.  Healthy lifestyle choices, such as diet and exercise. What can I expect for my preventive care visit? Physical exam Your health care provider will check:  Height and weight. These may be used to calculate body mass index (BMI), which is a measurement that tells if you are at a healthy weight.  Heart rate and blood pressure.  Your skin for abnormal spots. Counseling Your health care provider may ask you questions about:  Alcohol, tobacco, and drug use.  Emotional well-being.  Home and relationship well-being.  Sexual activity.  Eating habits.  History of falls.  Memory and ability to understand (cognition).  Work and work Statistician.  Pregnancy and menstrual history. What immunizations do I need?  Influenza (flu) vaccine  This is recommended every year. Tetanus, diphtheria, and pertussis (Tdap) vaccine  You may need a Td booster every 10 years. Varicella (chickenpox) vaccine  You may  need this vaccine if you have not already been vaccinated. Zoster (shingles) vaccine  You may need this after age 63. Pneumococcal conjugate (PCV13) vaccine  One dose is recommended after age 103. Pneumococcal polysaccharide (PPSV23) vaccine  One dose is recommended after age 79. Measles, mumps, and rubella (MMR) vaccine  You may need at least one dose of MMR if you were born in 1957 or later. You may also need a second dose. Meningococcal conjugate (MenACWY) vaccine  You may need this if you have certain  conditions. Hepatitis A vaccine  You may need this if you have certain conditions or if you travel or work in places where you may be exposed to hepatitis A. Hepatitis B vaccine  You may need this if you have certain conditions or if you travel or work in places where you may be exposed to hepatitis B. Haemophilus influenzae type b (Hib) vaccine  You may need this if you have certain conditions. You may receive vaccines as individual doses or as more than one vaccine together in one shot (combination vaccines). Talk with your health care provider about the risks and benefits of combination vaccines. What tests do I need? Blood tests  Lipid and cholesterol levels. These may be checked every 5 years, or more frequently depending on your overall health.  Hepatitis C test.  Hepatitis B test. Screening  Lung cancer screening. You may have this screening every year starting at age 48 if you have a 30-pack-year history of smoking and currently smoke or have quit within the past 15 years.  Colorectal cancer screening. All adults should have this screening starting at age 49 and continuing until age 62. Your health care provider may recommend screening at age 55 if you are at increased risk. You will have tests every 1-10 years, depending on your results and the type of screening test.  Diabetes screening. This is done by checking your blood sugar (glucose) after you have not eaten for a while (fasting). You may have this done every 1-3 years.  Mammogram. This may be done every 1-2 years. Talk with your health care provider about how often you should have regular mammograms.  BRCA-related cancer screening. This may be done if you have a family history of breast, ovarian, tubal, or peritoneal cancers. Other tests  Sexually transmitted disease (STD) testing.  Bone density scan. This is done to screen for osteoporosis. You may have this done starting at age 66. Follow these instructions at  home: Eating and drinking  Eat a diet that includes fresh fruits and vegetables, whole grains, lean protein, and low-fat dairy products. Limit your intake of foods with high amounts of sugar, saturated fats, and salt.  Take vitamin and mineral supplements as recommended by your health care provider.  Do not drink alcohol if your health care provider tells you not to drink.  If you drink alcohol: ? Limit how much you have to 0-1 drink a day. ? Be aware of how much alcohol is in your drink. In the [place] one drink equals one 12 oz bottle of beer (355 mL), one 5 oz glass of Sargun Rummell (148 mL), or one 1 oz glass of hard liquor (44 mL). Lifestyle  Take daily care of your teeth and gums.  Stay active. Exercise for at least 30 minutes on 5 or more days each week.  Do not use any products that contain nicotine or tobacco, such as cigarettes, e-cigarettes, and chewing tobacco. If you need help quitting, ask your  health care provider.  If you are sexually active, practice safe sex. Use a condom or other form of protection in order to prevent STIs (sexually transmitted infections).  Talk with your health care provider about taking a low-dose aspirin or statin. What's next?  Go to your health care provider once a year for a well check visit.  Ask your health care provider how often you should have your eyes and teeth checked.  Stay up to date on all vaccines. This information is not intended to replace advice given to you by your health care provider. Make sure you discuss any questions you have with your health care provider. Document Released: 03/17/2015 Document Revised: 02/12/2018 Document Reviewed: 02/12/2018 Elsevier Patient Education  2020 [employer].

## 2018-10-15 ENCOUNTER — Ambulatory Visit: Payer: Self-pay

## 2018-10-15 ENCOUNTER — Encounter: Payer: Self-pay | Admitting: Internal Medicine

## 2018-10-15 LAB — THYROID PANEL WITH TSH
Free Thyroxine Index: 2.1 (ref 1.4–3.8)
T3 Uptake: 35 % (ref 22–35)
T4, Total: 6.1 ug/dL (ref 5.1–11.9)
TSH: 0.02 mIU/L — ABNORMAL LOW (ref 0.40–4.50)

## 2018-10-15 MED ORDER — LOSARTAN POTASSIUM 100 MG PO TABS: 100.0000 mg | ORAL_TABLET | Freq: Every day | ORAL | 1 refills | Status: DC

## 2018-10-15 MED ORDER — NEBIVOLOL HCL 5 MG PO TABS: 5.0000 mg | ORAL_TABLET | Freq: Every day | ORAL | 1 refills | Status: DC

## 2018-10-15 NOTE — Telephone Encounter (Signed)
Provided Lab results of Dr. Ronnald Ramp  Of 10/15/18 Patient voices understanding.  Would like for Dr. Ronnald Ramp to discuss care with her Cardiologist.

## 2018-10-16 DIAGNOSIS — Z1231 Encounter for screening mammogram for malignant neoplasm of breast: Secondary | ICD-10-CM | POA: Insufficient documentation

## 2018-10-16 DIAGNOSIS — Z23 Encounter for immunization: Secondary | ICD-10-CM | POA: Insufficient documentation

## 2018-10-19 ENCOUNTER — Encounter (HOSPITAL_COMMUNITY): Payer: Self-pay

## 2018-10-28 ENCOUNTER — Other Ambulatory Visit: Payer: Self-pay

## 2018-10-28 ENCOUNTER — Ambulatory Visit (HOSPITAL_COMMUNITY)
Admission: RE | Admit: 2018-10-28 | Discharge: 2018-10-28 | Disposition: A | Discharge status: Home / Self Care | Payer: Medicare Other | Source: Ambulatory Visit | Attending: Nurse Practitioner | Admitting: Nurse Practitioner

## 2018-10-28 ENCOUNTER — Encounter (HOSPITAL_COMMUNITY): Payer: Self-pay | Admitting: Nurse Practitioner

## 2018-10-28 VITALS — BP 138/88 | HR 64 | Ht 65.0 in | Wt 154.0 lb

## 2018-10-28 DIAGNOSIS — I10 Essential (primary) hypertension: Secondary | ICD-10-CM | POA: Diagnosis not present

## 2018-10-28 DIAGNOSIS — I480 Paroxysmal atrial fibrillation: Secondary | ICD-10-CM

## 2018-10-28 DIAGNOSIS — Z888 Allergy status to other drugs, medicaments and biological substances status: Secondary | ICD-10-CM | POA: Insufficient documentation

## 2018-10-28 DIAGNOSIS — Z825 Family history of asthma and other chronic lower respiratory diseases: Secondary | ICD-10-CM | POA: Insufficient documentation

## 2018-10-28 DIAGNOSIS — Z8249 Family history of ischemic heart disease and other diseases of the circulatory system: Secondary | ICD-10-CM | POA: Diagnosis not present

## 2018-10-28 DIAGNOSIS — M19049 Primary osteoarthritis, unspecified hand: Secondary | ICD-10-CM | POA: Insufficient documentation

## 2018-10-28 DIAGNOSIS — Z886 Allergy status to analgesic agent status: Secondary | ICD-10-CM | POA: Insufficient documentation

## 2018-10-28 DIAGNOSIS — Z7901 Long term (current) use of anticoagulants: Secondary | ICD-10-CM | POA: Insufficient documentation

## 2018-10-28 DIAGNOSIS — Z833 Family history of diabetes mellitus: Secondary | ICD-10-CM | POA: Insufficient documentation

## 2018-10-28 DIAGNOSIS — Z79899 Other long term (current) drug therapy: Secondary | ICD-10-CM | POA: Diagnosis not present

## 2018-10-28 DIAGNOSIS — M47812 Spondylosis without myelopathy or radiculopathy, cervical region: Secondary | ICD-10-CM | POA: Diagnosis not present

## 2018-10-28 DIAGNOSIS — J45909 Unspecified asthma, uncomplicated: Secondary | ICD-10-CM | POA: Insufficient documentation

## 2018-10-28 DIAGNOSIS — F419 Anxiety disorder, unspecified: Secondary | ICD-10-CM | POA: Diagnosis not present

## 2018-10-28 DIAGNOSIS — I4510 Unspecified right bundle-branch block: Secondary | ICD-10-CM | POA: Diagnosis not present

## 2018-10-28 DIAGNOSIS — F17210 Nicotine dependence, cigarettes, uncomplicated: Secondary | ICD-10-CM | POA: Insufficient documentation

## 2018-10-28 DIAGNOSIS — I4892 Unspecified atrial flutter: Secondary | ICD-10-CM | POA: Insufficient documentation

## 2018-10-28 DIAGNOSIS — Z881 Allergy status to other antibiotic agents status: Secondary | ICD-10-CM | POA: Diagnosis not present

## 2018-10-28 LAB — MAGNESIUM: Magnesium: 2 mg/dL (ref 1.7–2.4)

## 2018-10-28 MED ORDER — LOSARTAN POTASSIUM 100 MG PO TABS: 50.0000 mg | ORAL_TABLET | Freq: Every day | ORAL | 1 refills | Status: DC

## 2018-10-28 NOTE — Progress Notes (Addendum)
Primary Care Physician: Janith Lima, MD Referring Physician: Dr. Ronny Bacon Elizabeth Mcconnell is a 73 y.o. female with a h/o  Prior h/o atrial flutter ablation as well atrial flutter ablation, maintaining SR on Tikosyn. She is in the afib clinic for Tikosyn surveillance. She has not noted any afib. She has had issues with BP and recent PCP visit showed elevation of BP. Her PCP instructed  to go up to losartan 100 mg qd from 25 mg daily as well as add Bystolic for elevation of HR in the office that day.. These changes concerned her so she did not start Bystolic and only increased losartan to 50 mg daily. She went to her drugstore and  BP was elevated to 170/80 and pharmacist encouraged her to start Bystolic. Today on both drugs her BP is 139/88. She does drink 2 glass of wine a night. No bleeding issues with eliquis.   Today, she denies symptoms of palpitations, chest pain, shortness of breath, orthopnea, PND, lower extremity edema, dizziness, presyncope, syncope, or neurologic sequela. The patient is tolerating medications without difficulties and is otherwise without complaint today.   Past Medical History:  Diagnosis Date  . Adenomatous polyp of colon 10/2013   f/u colo 10/2018  . Allergic rhinitis   . ALLERGIC RHINITIS   . ANXIETY    "situational" (02/14/2015)  . Arthritis    "neck, back, hands" (02/14/2015)  . Asthma   . Atrial fibrillation with RVR ([place])   . Atrial flutter ([place])    failed cardioversion 11/2012; s/p ablation 02-02-2013 by Dr Rayann Heman  . Chronic bronchitis ([place])    "I'll have it most years" (02/14/2015)  . Diverticulosis   . Endometriosis   . Essential hypertension 05/30/2014  . Hepatic steatosis   . Oral candidiasis   . Osteopenia 08/2015   T score -1.9 FRAX 16% / 6.5%  . Pneumothorax on left 1996   left lower lung collapse s/p resection  . Small bowel obstruction ([place])   . Tobacco abuse    Past Surgical History:  Procedure Laterality Date  . ABDOMINAL  HYSTERECTOMY    . APPENDECTOMY  1978  . ATRIAL FIBRILLATION ABLATION N/A 11/05/2016   Procedure: Atrial Fibrillation Ablation;  Surgeon: Thompson Grayer, MD;  Location: [facility];  Service: Cardiovascular;  Laterality: N/A;  . ATRIAL FLUTTER ABLATION N/A 02/02/2013   Procedure: ATRIAL FLUTTER ABLATION;  Surgeon: Coralyn Mark, MD;  Location: [facility];  Service: Cardiovascular;  Laterality: N/A;  . CARDIOVERSION N/A 11/27/2012   Procedure: CARDIOVERSION;  Surgeon: Thayer Headings, MD;  Location: [facility];  Service: Cardiovascular;  Laterality: N/A;  . [place]; 1978  . LAPAROSCOPIC PARTIAL COLECTOMY N/A 05/04/2015   Procedure: LAPAROSCOPIC LOW ANTERIOR RESECTION LAPAROSCOPIC LYSIS OF ADHESIONS, SPLENIC FLEXURE MOBILIZATION;  Surgeon: Michael Boston, MD;  Location: [facility];  Service: General;  Laterality: N/A;  . LUNG REMOVAL, PARTIAL Left 02/1965   LLL  . TOTAL ABDOMINAL HYSTERECTOMY W/ BILATERAL SALPINGOOPHORECTOMY  1984   Endometriosis    Current Outpatient Medications  Medication Sig Dispense Refill  . acetaminophen (TYLENOL) 650 MG CR tablet Take 1,300 mg by mouth 2 (two) times daily as needed for pain.     [name] ALPRAZolam (XANAX) 0.25 MG tablet Take 1 tablet (0.25 mg total) by mouth at bedtime as needed for anxiety or sleep. 30 tablet 0  . Calcium Carb-Cholecalciferol (CALCIUM 600+D) 600-800 MG-UNIT TABS Take 1 tablet by mouth daily.    [name]  dofetilide (TIKOSYN) 250 MCG capsule TAKE 1 CAPSULE BY MOUTH 2 TIMES DAILY 180 capsule 3  . ELIQUIS 5 MG TABS tablet TAKE 1 TABLET BY MOUTH 2 TIMES DAILY 180 tablet 2  . estradiol (VIVELLE-DOT) 0.05 MG/24HR patch apply 1 PATCH TO THE SKIN TWICE A WEEK 8 patch 0  . fluticasone (FLONASE) 50 MCG/ACT nasal spray USE 2 SPRAYS IN EACH NOSTRIL 2 TIMES DAILY 16 g 5  . fluticasone (FLOVENT HFA) 110 MCG/ACT inhaler INHALE 2 PUFFS INTO THE LUNGS 2 TIMES DAILY 12 g 5  . levalbuterol (XOPENEX HFA) 45 MCG/ACT inhaler inhale 1 TO 2 PUFFS BY MOUTH  EVERY 4 HOURS AS NEEDED 15 g 3  . LIVALO 2 MG TABS TAKE 1 TABLET BY MOUTH EVERY DAY 90 tablet 1  . losartan (COZAAR) 100 MG tablet Take 1 tablet (100 mg total) by mouth daily. 90 tablet 1  . magic mouthwash SOLN RINSE 5 mls BY MOUTH 3 TO 4 TIMES DAILY AS NEEDED    . magnesium oxide (MAG-OX) 400 MG tablet Take 1 tablet (400 mg total) by mouth daily. 30 tablet 6  . montelukast (SINGULAIR) 10 MG tablet Take 1 tablet (10 mg total) by mouth daily. 90 tablet 3  . nebivolol (BYSTOLIC) 5 MG tablet Take 1 tablet (5 mg total) by mouth daily. 90 tablet 1  . potassium chloride (K-DUR) 10 MEQ tablet TAKE 2 TABLETS BY MOUTH EVERY DAY 60 tablet 11   No current facility-administered medications for this encounter.     Allergies  Allergen Reactions  . Tramadol Other (See Comments)    Reaction:  Dizziness and weakness   . Amoxicillin-Pot Clavulanate Nausea And Vomiting and Other (See Comments)    Has patient had a PCN reaction causing immediate rash, facial/tongue/throat swelling, SOB or lightheadedness with hypotension: No Has patient had a PCN reaction causing severe rash involving mucus membranes or skin necrosis: No Has patient had a PCN reaction that required hospitalization No Has patient had a PCN reaction occurring within the last 10 years: No If all of the above answers are "NO", then may proceed with Cephalosporin use.  . Flagyl [Metronidazole] Nausea And Vomiting  . Flecainide Other (See Comments)    Dizziness   . Omnicef [Cefdinir] Nausea And Vomiting  . Avelox [Moxifloxacin] Hives and Itching    Social History   Socioeconomic History  . Marital status: Widowed    Spouse name: Not on file  . Number of children: 2  . Years of education: Not on file  . Highest education level: Not on file  Occupational History  . 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 Financial resource strain: Not hard at all  . Food insecurity     Worry: Never true    Inability: Never true  . Transportation needs    Medical: No    Non-medical: No  Tobacco Use  . Smoking status: Current Every Day Smoker    Packs/day: 0.25    Types: Cigarettes  . Smokeless tobacco: Never Used  . Tobacco comment: smokes only 1-2 cigarettes intermittently   Substance and Sexual Activity  . Alcohol use: Yes    Alcohol/week: 14.0 standard drinks    Types: 14 Standard drinks or equivalent per week    Comment: daily wine  . Drug use: No  . Sexual activity: Not Currently    Birth control/protection: Surgical, Post-menopausal    Comment: HYST-1st intercourse 73  yo-Fewer than 5 partners  Lifestyle  . Physical activity    Days per week: 3 days    Minutes per session: 50 min  . Stress: Only a little  Relationships  . Social connections    Talks on phone: More than three times a week    Gets together: More than three times a week    Attends religious service: More than 4 times per year    Active member of club or organization: Yes    Attends meetings of clubs or organizations: More than 4 times per year    Relationship status: Widowed  . Intimate partner violence    Fear of current or ex partner: Not on file    Emotionally abused: Not on file    Physically abused: Not on file    Forced sexual activity: Not on file  Other Topics Concern  . Not on file  Social History Narrative   Lives with spouse;    2 children      [facility]:   She is from [facility]. 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 Previously has attended business school. Previously did office work and Programmer, applications at [employer]. She has also worked for [employer]. She does have a cat currently. She has indoor plants. No mold exposure.    Family History  Problem Relation Age of Onset  . Asthma Maternal Grandfather   . Stroke Father 59  . Hypertension Father   . Heart disease Father   . Diabetes Mother   . Asthma Child   . Colon cancer Neg  Hx   . Rectal cancer Neg Hx   . Stomach cancer Neg Hx     ROS- All systems are reviewed and negative except as per the HPI above  Physical Exam: Vitals:   10/28/18 1038  BP: 138/88  Pulse: 64  Weight: 69.9 kg  Height: 5\' 5"  (1.651 m)   Wt Readings from Last 3 Encounters:  10/28/18 69.9 kg  10/14/18 68.5 kg  10/14/18 68.5 kg    Labs: Lab Results  Component Value Date   NA 137 10/14/2018   K 4.6 10/14/2018   CL 101 10/14/2018   CO2 27 10/14/2018   GLUCOSE 93 10/14/2018   BUN 11 10/14/2018   CREATININE 0.80 10/14/2018   CALCIUM 10.1 10/14/2018   PHOS 2.8 05/08/2015   MG 2.0 10/28/2018   Lab Results  Component Value Date   INR 1.06 05/09/2016   Lab Results  Component Value Date   CHOL 195 10/14/2018   HDL 75.50 10/14/2018   LDLCALC 79 10/14/2018   TRIG 200.0 (H) 10/14/2018     GEN- The patient is well appearing, alert and oriented x 3 today.   Head- normocephalic, atraumatic Eyes-  Sclera clear, conjunctiva pink Ears- hearing intact Oropharynx- clear Neck- supple, no JVP Lymph- no cervical lymphadenopathy Lungs- Clear to ausculation bilaterally, normal work of breathing Heart- Regular rate and rhythm, no murmurs, rubs or gallops, PMI not laterally displaced GI- soft, NT, ND, + BS Extremities- no clubbing, cyanosis, or edema MS- no significant deformity or atrophy Skin- no rash or lesion Psych- euthymic mood, full affect Neuro- strength and sensation are intact  EKG-SR at 64 bpm, Qrs int 96 ms, qtc 449 ms Epic records reviewed    Assessment and Plan: 1. Paroxysmal  afib  Currently quiet Continue dofetilide 250 mcg bid  Continue Bystolic 5 mg daily   Continue eliquis 5 mg bid for  CHA2DS2VAS score of at least  3 Mag today as bmet was drawn 8/12  Decrease wine to at least one glass a night as pt states she is currently drinking 2 glasses of wine nightly, goal is 2 glasses a week   2. HTN Better controlled today Continue losartan 50 mg daily and  Bystolic 5 mg qd Pt will check BP at home to see if it is well controlled and if not, will call office and will  increase losartan to 100 mg daily, will need bmet if this is the case  F/u with Dr. Rayann Heman in 6 months   [name] C. Atreyu Mak, [facility] [address] [place], [place] [address] [phone]

## 2018-11-04 ENCOUNTER — Other Ambulatory Visit: Payer: Self-pay | Admitting: Internal Medicine

## 2018-11-04 NOTE — Telephone Encounter (Signed)
Pt last saw Roderic Palau, NP on 10/28/18, last labs 10/14/18 Creat 0.80, age 73, weight 69.9kg, based on specified criteria pt is on appropriate dosage of Eliquis 5mg  BID.  Will refill rx.

## 2018-11-16 ENCOUNTER — Telehealth (HOSPITAL_COMMUNITY): Payer: Self-pay | Admitting: *Deleted

## 2018-11-16 ENCOUNTER — Encounter: Payer: Self-pay | Admitting: Internal Medicine

## 2018-11-16 DIAGNOSIS — I10 Essential (primary) hypertension: Secondary | ICD-10-CM

## 2018-11-16 MED ORDER — LOSARTAN POTASSIUM 100 MG PO TABS: 100.0000 mg | ORAL_TABLET | Freq: Every day | ORAL | 3 refills | Status: DC

## 2018-11-16 NOTE — Telephone Encounter (Signed)
Patient called in stating her BP is continuing to stay over SBP 160s  - this weekend her BP was in the 190s. She did not feel well with this. Encouraged low sodium diet. BP this morning is 160/95. Discussed with Elizabeth Palau NP will increase losartan to 100mg  a day and reck BP/BMET on Friday. Pt in agreement with plan.

## 2018-11-18 ENCOUNTER — Other Ambulatory Visit: Payer: Self-pay | Admitting: Gynecology

## 2018-11-18 ENCOUNTER — Encounter: Payer: Self-pay | Admitting: Internal Medicine

## 2018-11-20 ENCOUNTER — Ambulatory Visit (HOSPITAL_COMMUNITY)
Admission: RE | Admit: 2018-11-20 | Discharge: 2018-11-20 | Disposition: A | Discharge status: Home / Self Care | Payer: Medicare Other | Source: Ambulatory Visit | Attending: Nurse Practitioner | Admitting: Nurse Practitioner

## 2018-11-20 ENCOUNTER — Other Ambulatory Visit: Payer: Self-pay

## 2018-11-20 ENCOUNTER — Other Ambulatory Visit (HOSPITAL_COMMUNITY): Payer: Self-pay | Admitting: *Deleted

## 2018-11-20 DIAGNOSIS — I10 Essential (primary) hypertension: Secondary | ICD-10-CM | POA: Insufficient documentation

## 2018-11-20 DIAGNOSIS — E0590 Thyrotoxicosis, unspecified without thyrotoxic crisis or storm: Secondary | ICD-10-CM

## 2018-11-20 LAB — BASIC METABOLIC PANEL
Anion gap: 7 (ref 5–15)
BUN: 8 mg/dL (ref 8–23)
CO2: 25 mmol/L (ref 22–32)
Calcium: 9.1 mg/dL (ref 8.9–10.3)
Chloride: 106 mmol/L (ref 98–111)
Creatinine, Ser: 0.77 mg/dL (ref 0.44–1.00)
GFR calc Af Amer: >60 mL/min (ref >60)
GFR calc non Af Amer: >60 mL/min (ref >60)
Glucose, Bld: 85 mg/dL (ref 70–99)
Potassium: 4.2 mmol/L (ref 3.5–5.1)
Sodium: 138 mmol/L (ref 135–145)

## 2018-11-20 MED ORDER — AMLODIPINE BESYLATE 5 MG PO TABS: 2.5000 mg | ORAL_TABLET | Freq: Every day | ORAL | 3 refills | Status: DC

## 2018-11-20 NOTE — Progress Notes (Signed)
Pt is here  for BP check. Losartan  increased to 100 mg daily but BP is still 160/90. Bmet done. She never started bystolic rx'ed by PCP. Will start amlodipine 2.5 mg daily. Smoking cessation encouraged. Will refer to endocrinology for elevated TSH, PCP said he would do but she has not heard from referral. This may be aggravating BP.recheck in 2 weeks. If BP still not well controlled will send to HTN clinic.

## 2018-11-20 NOTE — Patient Instructions (Signed)
Start amlodipine 2.5mg  once a day (1/2 of the 5mg  tab)

## 2018-11-20 NOTE — Progress Notes (Signed)
error 

## 2018-11-23 ENCOUNTER — Other Ambulatory Visit: Payer: Self-pay | Admitting: Nurse Practitioner

## 2018-11-25 ENCOUNTER — Encounter: Payer: Self-pay | Admitting: Gynecology

## 2018-12-04 ENCOUNTER — Ambulatory Visit: Payer: Medicare Other

## 2018-12-07 ENCOUNTER — Ambulatory Visit (HOSPITAL_COMMUNITY)
Admission: RE | Admit: 2018-12-07 | Discharge: 2018-12-07 | Disposition: A | Discharge status: Home / Self Care | Payer: Medicare Other | Source: Ambulatory Visit | Attending: Nurse Practitioner | Admitting: Nurse Practitioner

## 2018-12-07 ENCOUNTER — Other Ambulatory Visit: Payer: Self-pay

## 2018-12-07 DIAGNOSIS — M79661 Pain in right lower leg: Secondary | ICD-10-CM

## 2018-12-07 DIAGNOSIS — I10 Essential (primary) hypertension: Secondary | ICD-10-CM

## 2018-12-07 NOTE — Progress Notes (Signed)
Pt is for BP check. Mostly systolic's in the [id number] since staring amlodipine 2.5 mg qd. She has felt better but has noted rt calf pain with mild swelling of ankle. No swelling noted today. Starts  no missed anticoagulation. Will schedule for U/S of rt calf.

## 2018-12-08 ENCOUNTER — Ambulatory Visit (HOSPITAL_COMMUNITY)
Admission: RE | Admit: 2018-12-08 | Discharge: 2018-12-08 | Disposition: A | Discharge status: Home / Self Care | Payer: Medicare Other | Source: Ambulatory Visit | Attending: Nurse Practitioner | Admitting: Nurse Practitioner

## 2018-12-08 DIAGNOSIS — M79661 Pain in right lower leg: Secondary | ICD-10-CM | POA: Diagnosis not present

## 2018-12-08 NOTE — Progress Notes (Signed)
RLE venous duplex       has been completed. Preliminary results can be found under CV proc through chart review. June Leap, BS, RDMS, RVT   Called results to [facility]

## 2018-12-09 ENCOUNTER — Ambulatory Visit (INDEPENDENT_AMBULATORY_CARE_PROVIDER_SITE_OTHER): Payer: Medicare Other | Admitting: Internal Medicine

## 2018-12-09 ENCOUNTER — Other Ambulatory Visit: Payer: Self-pay

## 2018-12-09 ENCOUNTER — Encounter: Payer: Self-pay | Admitting: Internal Medicine

## 2018-12-09 VITALS — BP 132/76 | HR 87 | Temp 98.7°F | Ht 65.0 in | Wt 152.8 lb

## 2018-12-09 DIAGNOSIS — E0590 Thyrotoxicosis, unspecified without thyrotoxic crisis or storm: Secondary | ICD-10-CM

## 2018-12-09 NOTE — Patient Instructions (Signed)
-   Please stop at the lab  - Will set you up for thyroid uptake and scan at the hospital    Thyroid Uptake and Scan works like this: We would first check a thyroid "scan" (a special, but easy and painless type of thyroid x ray).  you go to the x-ray department of the hospital to swallow a pill, which contains a miniscule amount of radiation.  You will not notice any symptoms from this.  You will go back to the x-ray department the next day, to lie down in front of a camera.  The results of this will be sent to me.

## 2018-12-09 NOTE — Progress Notes (Signed)
Name: Elizabeth Mcconnell Age  MRN/ DOB: [id number], [birth year]    Age/ Sex: 73 y.o., female    PCP: Janith Lima, MD   Reason for Endocrinology Evaluation: Low TSH      Date of Initial Endocrinology Evaluation: 12/11/2018     HPI: [name] is a 73 y.o. female with a past medical history of HTN, PAF, Asthma and dyslipidemia. The patient presented for initial endocrinology clinic visit on 12/11/2018 for consultative assistance with her low TSH     Pt was noted to have a suppressed TSH < 0.01 uIU/mL on routine labs in 09/2017 ,she was referred to endocrinology but did not make that appointment as she had lost her husband at the time, repeat TFT's confirmed this abnormality by 10/2018.   Pt has history of paroxysmal A.Fib (S/P ablation 2018 ) and Osteopenia    Denies weight loss , denies palpitations, diarrhea  Has chronic anxiety but no tremors.   Denies local neck symptoms.    No FH of thyroid disease.    HISTORY:  Past Medical History:  Past Medical History:  Diagnosis Date  . Adenomatous polyp of colon 10/2013   f/u colo 10/2018  . Allergic rhinitis   . ALLERGIC RHINITIS   . ANXIETY    "situational" (02/14/2015)  . Arthritis    "neck, back, hands" (02/14/2015)  . Asthma   . Atrial fibrillation with RVR ([place])   . Atrial flutter ([place])    failed cardioversion 11/2012; s/p ablation 02-02-2013 by Dr Rayann Heman  . Chronic bronchitis ([place])    "I'll have it most years" (02/14/2015)  . Diverticulosis   . Endometriosis   . Essential hypertension 05/30/2014  . Hepatic steatosis   . Oral candidiasis   . Osteopenia 08/2015   T score -1.9 FRAX 16% / 6.5%  . Pneumothorax on left 1996   left lower lung collapse s/p resection  . Small bowel obstruction ([place])   . Tobacco abuse    Past Surgical History:  Past Surgical History:  Procedure Laterality Date  . ABDOMINAL HYSTERECTOMY    . APPENDECTOMY  1978  . ATRIAL FIBRILLATION ABLATION N/A 11/05/2016   Procedure:  Atrial Fibrillation Ablation;  Surgeon: Thompson Grayer, MD;  Location: [facility];  Service: Cardiovascular;  Laterality: N/A;  . ATRIAL FLUTTER ABLATION N/A 02/02/2013   Procedure: ATRIAL FLUTTER ABLATION;  Surgeon: Coralyn Mark, MD;  Location: [facility];  Service: Cardiovascular;  Laterality: N/A;  . CARDIOVERSION N/A 11/27/2012   Procedure: CARDIOVERSION;  Surgeon: Thayer Headings, MD;  Location: [facility];  Service: Cardiovascular;  Laterality: N/A;  . [place]; 1978  . LAPAROSCOPIC PARTIAL COLECTOMY N/A 05/04/2015   Procedure: LAPAROSCOPIC LOW ANTERIOR RESECTION LAPAROSCOPIC LYSIS OF ADHESIONS, SPLENIC FLEXURE MOBILIZATION;  Surgeon: Michael Boston, MD;  [facility];  Service: General;  Laterality: N/A;  . LUNG REMOVAL, PARTIAL Left 02/1965   LLL  . TOTAL ABDOMINAL HYSTERECTOMY W/ BILATERAL SALPINGOOPHORECTOMY  1984   Endometriosis      Social History:  reports that she has been smoking cigarettes. She has been smoking about 0.25 packs per day. She has never used smokeless tobacco. She reports current alcohol use of about 14.0 standard drinks of alcohol per week. She reports that she does not use drugs.  Family History: family history includes Asthma in her child and maternal grandfather; Diabetes in her mother; Heart disease in her father; Hypertension in her father; Stroke (age of onset: 3) in her father.  HOME MEDICATIONS: Allergies as of 12/09/2018      Reactions   Tramadol Other (See Comments)   Reaction:  Dizziness and weakness    Amoxicillin-pot Clavulanate Nausea And Vomiting, Other (See Comments)   Has patient had a PCN reaction causing immediate rash, facial/tongue/throat swelling, SOB or lightheadedness with hypotension: No Has patient had a PCN reaction causing severe rash involving mucus membranes or skin necrosis: No Has patient had a PCN reaction that required hospitalization No Has patient had a PCN reaction occurring within the last 10  years: No If all of the above answers are "NO", then may proceed with Cephalosporin use.   Flagyl [metronidazole] Nausea And Vomiting   Flecainide Other (See Comments)   Dizziness    Omnicef [cefdinir] Nausea And Vomiting   Avelox [moxifloxacin] Hives, Itching      Medication List       Accurate as of December 09, 2018 11:59 PM. If you have any questions, ask your nurse or doctor.        acetaminophen 650 MG CR tablet Commonly known as: TYLENOL Take 1,300 mg by mouth 2 (two) times daily as needed for pain.   ALPRAZolam 0.25 MG tablet Commonly known as: XANAX Take 1 tablet (0.25 mg total) by mouth at bedtime as needed for anxiety or sleep.   amLODipine 5 MG tablet Commonly known as: NORVASC Take 0.5 tablets (2.5 mg total) by mouth daily.   Calcium 600+D 600-800 MG-UNIT Tabs Generic drug: Calcium Carb-Cholecalciferol Take 1 tablet by mouth daily.   dofetilide 250 MCG capsule Commonly known as: TIKOSYN TAKE 1 CAPSULE BY MOUTH 2 TIMES DAILY   Eliquis 5 MG Tabs tablet Generic drug: apixaban TAKE 1 TABLET BY MOUTH 2 TIMES DAILY   estradiol 0.05 MG/24HR patch Commonly known as: VIVELLE-DOT apply 1 PATCH TO THE SKIN TWICE A WEEK   fluticasone 110 MCG/ACT inhaler Commonly known as: Flovent HFA INHALE 2 PUFFS INTO THE LUNGS 2 TIMES DAILY   fluticasone 50 MCG/ACT nasal spray Commonly known as: FLONASE USE 2 SPRAYS IN EACH NOSTRIL 2 TIMES DAILY   levalbuterol 45 MCG/ACT inhaler Commonly known as: XOPENEX HFA inhale 1 TO 2 PUFFS BY MOUTH EVERY 4 HOURS AS NEEDED   Livalo 2 MG Tabs Generic drug: Pitavastatin Calcium TAKE 1 TABLET BY MOUTH EVERY DAY   losartan 100 MG tablet Commonly known as: COZAAR Take 1 tablet (100 mg total) by mouth daily.   magic mouthwash Soln RINSE 5 mls BY MOUTH 3 TO 4 TIMES DAILY AS NEEDED   magnesium oxide 400 MG tablet Commonly known as: MAG-OX Take 1 tablet (400 mg total) by mouth daily.   montelukast 10 MG tablet Commonly known as:  SINGULAIR Take 1 tablet (10 mg total) by mouth daily.   nebivolol 5 MG tablet Commonly known as: Bystolic Take 1 tablet (5 mg total) by mouth daily.   potassium chloride 10 MEQ tablet Commonly known as: KLOR-CON TAKE 2 TABLETS BY MOUTH EVERY DAY         REVIEW OF SYSTEMS: A comprehensive ROS was conducted with the patient and is negative except as per HPI and below:  ROS     OBJECTIVE:  VS: BP 132/76 (BP Location: Left Arm, Patient Position: Sitting, Cuff Size: Normal)   Pulse 87   Temp 98.7 F (37.1 C)   Ht 5\' 5"  (1.651 m)   Wt 152 lb 12.8 oz (69.3 kg)   SpO2 97%   BMI 25.43 kg/m    Wt Readings from  Last 3 Encounters:  12/09/18 152 lb 12.8 oz (69.3 kg)  10/28/18 154 lb (69.9 kg)  10/14/18 151 lb (68.5 kg)     EXAM: General: Pt appears well and is in NAD  Hydration: Well-hydrated with moist mucous membranes and good skin turgor  Eyes: External eye exam normal without stare, lid lag or exophthalmos.  EOM intact.   Ears, Nose, Throat: Hearing: Grossly intact bilaterally Dental: Good dentition  Throat: Clear without mass, erythema or exudate  Neck: General: Supple without adenopathy. Thyroid: Thyroid size normal.  No goiter or nodules appreciated. No thyroid bruit.  Lungs: Clear with good BS bilat with no rales, rhonchi, or wheezes  Heart: Auscultation: RRR.  Abdomen: Normoactive bowel sounds, soft, nontender, without masses or organomegaly palpable  Extremities:  BL LE: No pretibial edema normal ROM and strength.  Skin: Hair: Texture and amount normal with gender appropriate distribution Skin Inspection: No rashes Skin Palpation: Skin temperature, texture, and thickness normal to palpation  Neuro: Cranial nerves: II - XII grossly intact  Motor: Normal strength throughout DTRs: 2+ and symmetric in UE without delay in relaxation phase  Mental Status: Judgment, insight: Intact Orientation: Oriented to time, place, and person Mood and affect: No depression,  anxiety, or agitation     DATA REVIEWED:   Results for ANGELYSE, MEMMOTT (MRN [id number]) as of 12/09/2018 13:05  Ref. Range 09/15/2017 13:53 10/14/2018 14:51  TSH Latest Ref Range: 0.40 - 4.50 mIU/L <0.01 Repeated and verified X2. (L) 0.02 (L)  Thyroxine (T4) Latest Ref Range: 5.1 - 11.9 mcg/dL  6.1  Free Thyroxine Index Latest Ref Range: 1.4 - 3.8   2.1  T3 Uptake Latest Ref Range: 22 - 35 %  35    ASSESSMENT/PLAN/RECOMMENDATIONS:   1. Subclinical Hyperthyroidism :    The causes of subclinical hyperthyroidism are the same as the causes of overt hyperthyroidism, and like overt hyperthyroidism, subclinical hyperthyroidism can be persistent or transient. Common causes of subclinical hyperthyroidism include autonomously functioning thyroid adenomas and multinodular goiters, or Graves' disease.   Most patients with subclinical hyperthyroidism have no clinical manifestations of hyperthyroidism, and those symptoms that are present (eg, tachycardia, tremor, dyspnea on exertion, weight loss) are mild and nonspecific." However, subclinical hyperthyroidism is associated with an increased risk of atrial fibrillation and, primarily in postmenopausal women, a decrease in bone mineral density.  - Will repeat labs today and if her TFT's continue to be abnormal will proceed with thyroid uptake and scam the details of this scan was discussed the the patient    Addendum: The pt left the office without labs, she will be contacted by our staff   Signed electronically by: Mack Guise, MD  [facility] Endocrinology  [address] [place], [place] [address] Phone: [phone] FAX: [phone]   CC: Janith Lima, MD [address] [address] Phone: [phone] Fax: [phone]   Return to Endocrinology clinic as below: Future Appointments  Date Time Provider [place]  12/11/2018  2:00 PM [facility]  [facility] None  03/17/2019  1:00 PM Creighton Longley, Melanie Crazier, MD [facility] None  09/16/2019 12:00 PM Tanda Rockers, MD [facility] None

## 2018-12-10 ENCOUNTER — Encounter: Payer: Self-pay | Admitting: Gynecology

## 2018-12-11 ENCOUNTER — Other Ambulatory Visit: Payer: Self-pay

## 2018-12-11 ENCOUNTER — Other Ambulatory Visit (INDEPENDENT_AMBULATORY_CARE_PROVIDER_SITE_OTHER): Payer: Medicare Other

## 2018-12-11 DIAGNOSIS — E0590 Thyrotoxicosis, unspecified without thyrotoxic crisis or storm: Secondary | ICD-10-CM | POA: Diagnosis not present

## 2018-12-11 LAB — T4, FREE: Free T4: 0.89 ng/dL (ref 0.60–1.60)

## 2018-12-11 LAB — TSH: TSH: 0.19 u[IU]/mL — ABNORMAL LOW (ref 0.35–4.50)

## 2018-12-11 LAB — T3: T3, Total: 88 ng/dL (ref 76–181)

## 2018-12-15 ENCOUNTER — Other Ambulatory Visit: Payer: Self-pay | Admitting: Internal Medicine

## 2018-12-15 DIAGNOSIS — E785 Hyperlipidemia, unspecified: Secondary | ICD-10-CM

## 2018-12-16 ENCOUNTER — Telehealth: Payer: Self-pay

## 2018-12-16 MED ORDER — ESTRADIOL 0.05 MG/24HR TD PTTW: MEDICATED_PATCH | TRANSDERMAL | 0 refills | Status: DC

## 2018-12-16 NOTE — Telephone Encounter (Signed)
Past due for CE but scheduled 01/08/19. Needs refill on Vivelle patch.

## 2018-12-17 ENCOUNTER — Telehealth: Payer: Self-pay | Admitting: Internal Medicine

## 2018-12-17 NOTE — Telephone Encounter (Signed)
-----   Message from [facility], MD sent at 12/11/2018  8:08 AM EDT ----- Elizabeth Mcconnell,   This pt was supposed to have labs on her way out but somehow she left without it , I did put it in her check out noted , but I just realized today she never did have them.    Can you please call her and ask her to come back for labs?   Thank you

## 2018-12-17 NOTE — Telephone Encounter (Signed)
LMTCB to schedule lab appointment.  °

## 2018-12-17 NOTE — Telephone Encounter (Signed)
Patient had labs drawn 12/11/18

## 2018-12-28 ENCOUNTER — Other Ambulatory Visit: Payer: Self-pay | Admitting: Internal Medicine

## 2018-12-28 DIAGNOSIS — F411 Generalized anxiety disorder: Secondary | ICD-10-CM

## 2019-01-07 ENCOUNTER — Other Ambulatory Visit: Payer: Self-pay

## 2019-01-08 ENCOUNTER — Encounter: Payer: Self-pay | Admitting: Gynecology

## 2019-01-08 ENCOUNTER — Ambulatory Visit (INDEPENDENT_AMBULATORY_CARE_PROVIDER_SITE_OTHER): Payer: Medicare Other | Admitting: Gynecology

## 2019-01-08 VITALS — BP 124/76 | Ht 64.0 in | Wt 156.0 lb

## 2019-01-08 DIAGNOSIS — N898 Other specified noninflammatory disorders of vagina: Secondary | ICD-10-CM

## 2019-01-08 DIAGNOSIS — Z79890 Hormone replacement therapy (postmenopausal): Secondary | ICD-10-CM

## 2019-01-08 DIAGNOSIS — M8580 Other specified disorders of bone density and structure, unspecified site: Secondary | ICD-10-CM

## 2019-01-08 DIAGNOSIS — N952 Postmenopausal atrophic vaginitis: Secondary | ICD-10-CM

## 2019-01-08 DIAGNOSIS — Z01419 Encounter for gynecological examination (general) (routine) without abnormal findings: Secondary | ICD-10-CM | POA: Diagnosis not present

## 2019-01-08 MED ORDER — ESTRADIOL 0.05 MG/24HR TD PTTW: MEDICATED_PATCH | TRANSDERMAL | 2 refills | Status: DC

## 2019-01-08 NOTE — Patient Instructions (Signed)
Wean off of the estrogen patch as we discussed.  Call if you have any issues with this.  Follow-up in 1 year for annual exam.  We will do your bone density next year.

## 2019-01-08 NOTE — Progress Notes (Signed)
    Elizabeth Mcconnell Sep 18, 1945 [id number]        73 y.o.  [id number] for annual gynecologic exam.  Without gynecologic complaints.  Several issues noted below.  Past medical history,surgical history, problem list, medications, allergies, family history and social history were all reviewed and documented as reviewed in the EPIC chart.  ROS:  Performed with pertinent positives and negatives included in the history, assessment and plan.   Additional significant findings : None   Exam: [name] assistant Vitals:   01/08/19 1105  BP: 124/76  Weight: 156 lb (70.8 kg)  Height: 5\' 4"  (1.626 m)   Body mass index is 26.78 kg/m.  General appearance:  Normal affect, orientation and appearance. Skin: Grossly normal HEENT: Without gross lesions.  No cervical or supraclavicular adenopathy. Thyroid normal.  Lungs:  Clear without wheezing, rales or rhonchi Cardiac: RR, without RMG Abdominal:  Soft, nontender, without masses, guarding, rebound, organomegaly or hernia Breasts:  Examined lying and sitting without masses, retractions, discharge or axillary adenopathy. Pelvic:  Ext, BUS, Vagina: With atrophic changes.  2 cm midline posterior vaginal wall submucosal cyst stable from prior exams.  Adnexa: Without masses or tenderness    Anus and perineum: Normal   Rectovaginal: Normal sphincter tone without palpated masses or tenderness.    Assessment/Plan:  73 y.o. [id number] female for annual gynecologic exam.  Status post TAH/BSO for endometriosis.  1. Postmenopausal/HRT.  Continues on Vivelle 0.05 mg patches.  Also is on Eliquis for A. fib.  We discussed realistic risk of thrombosis to include stroke heart attack DVT.  We also discussed breast cancer issue.  Quality of life issues reviewed.  My recommendation is for her to wean from the patch and see how she does.  She will start by cutting it in half over the next 1 to 2 months and then stopping and we will see how she does. 2. Osteopenia.   DEXA 2019 T score -1.9 FRAX 16% / 6.5%.  We have discussed the increased hip fracture FRAX and indication for medication.  At this point the patient declined medication understanding and accepting the increased risk of hip fracture.  Prefers to repeat her DEXA next year and then rediscussed at that time. 3. Midline posterior vaginal wall cyst.  Present for years and stable asymptomatic to the patient. 4. Colonoscopy 2015.  Repeat at their recommended interval. 5. Mammography 11/2018.  Continue with annual mammography when due.  Breast exam normal today. 6. Pap smear 2011.  No Pap smear done today.  No history of abnormal Pap smears.  We both agree to stop screening per current screening guidelines. 7. Health maintenance.  No routine lab work done as patient does this elsewhere.  Follow-up 1 year, sooner as needed.   [name] MD, 11:34 AM 01/08/2019

## 2019-01-11 ENCOUNTER — Other Ambulatory Visit (HOSPITAL_COMMUNITY): Payer: Self-pay | Admitting: Internal Medicine

## 2019-01-11 ENCOUNTER — Other Ambulatory Visit: Payer: Self-pay | Admitting: Internal Medicine

## 2019-01-20 ENCOUNTER — Encounter (HOSPITAL_COMMUNITY)
Admission: RE | Admit: 2019-01-20 | Discharge: 2019-01-20 | Disposition: A | Discharge status: Home / Self Care | Payer: Medicare Other | Source: Ambulatory Visit | Attending: Internal Medicine | Admitting: Internal Medicine

## 2019-01-20 ENCOUNTER — Encounter (HOSPITAL_COMMUNITY): Payer: Medicare Other

## 2019-01-20 ENCOUNTER — Other Ambulatory Visit: Payer: Self-pay

## 2019-01-20 ENCOUNTER — Other Ambulatory Visit (HOSPITAL_COMMUNITY): Payer: Medicare Other

## 2019-01-20 DIAGNOSIS — E0590 Thyrotoxicosis, unspecified without thyrotoxic crisis or storm: Secondary | ICD-10-CM

## 2019-01-20 MED ORDER — SODIUM IODIDE I-123 7.4 MBQ CAPS
440.0000 | ORAL_CAPSULE | Freq: Once | ORAL | Status: AC
  Administered 2019-01-20: 440 via ORAL

## 2019-01-21 ENCOUNTER — Other Ambulatory Visit (HOSPITAL_COMMUNITY): Payer: Medicare Other

## 2019-01-21 ENCOUNTER — Encounter (HOSPITAL_COMMUNITY)
Admission: RE | Admit: 2019-01-21 | Discharge: 2019-01-21 | Disposition: A | Discharge status: Home / Self Care | Payer: Medicare Other | Source: Ambulatory Visit | Attending: Internal Medicine | Admitting: Internal Medicine

## 2019-01-22 ENCOUNTER — Telehealth: Payer: Self-pay | Admitting: Internal Medicine

## 2019-01-22 MED ORDER — METHIMAZOLE 5 MG PO TABS: 5.0000 mg | ORAL_TABLET | Freq: Every day | ORAL | 3 refills | Status: DC

## 2019-01-22 NOTE — Telephone Encounter (Signed)
Discussed thyroid uptake and scan as below    FINDINGS: Homogeneous tracer distribution in both thyroid lobes.  No focal areas of increased or decreased tracer localization.  4 hour I-123 uptake = 9.2% (normal 5-20%)  24 hour I-123 uptake = 23.7% (normal 10-30%)  IMPRESSION: Normal exam.   No evidence of autonomous nodules.    Will proceed with methimazole 5 mg daily. Discussed rare side effects of rash, arthralgia, hepatitis and agranulocytosis. We discussed this is rare and to stop the medicine with any side effects and have Labs drawn    [place], MD  [facility] Endocrinology  [facility] [address] [place], [place] [address] Phone: [phone] FAX: [phone]

## 2019-02-09 ENCOUNTER — Other Ambulatory Visit: Payer: Self-pay | Admitting: Gynecology

## 2019-02-09 ENCOUNTER — Other Ambulatory Visit: Payer: Self-pay | Admitting: Internal Medicine

## 2019-02-09 ENCOUNTER — Other Ambulatory Visit (HOSPITAL_COMMUNITY): Payer: Self-pay | Admitting: Internal Medicine

## 2019-02-09 DIAGNOSIS — I10 Essential (primary) hypertension: Secondary | ICD-10-CM

## 2019-02-17 ENCOUNTER — Other Ambulatory Visit (HOSPITAL_COMMUNITY): Payer: Self-pay | Admitting: Nurse Practitioner

## 2019-02-17 DIAGNOSIS — I10 Essential (primary) hypertension: Secondary | ICD-10-CM

## 2019-03-15 ENCOUNTER — Other Ambulatory Visit: Payer: Self-pay

## 2019-03-17 ENCOUNTER — Ambulatory Visit: Payer: Medicare Other | Admitting: Internal Medicine

## 2019-03-17 ENCOUNTER — Encounter: Payer: Self-pay | Admitting: Internal Medicine

## 2019-03-17 ENCOUNTER — Other Ambulatory Visit: Payer: Self-pay

## 2019-03-17 VITALS — BP 124/78 | HR 66 | Temp 98.0°F | Ht 64.0 in | Wt 161.0 lb

## 2019-03-17 DIAGNOSIS — E0590 Thyrotoxicosis, unspecified without thyrotoxic crisis or storm: Secondary | ICD-10-CM | POA: Diagnosis not present

## 2019-03-17 LAB — T4, FREE: Free T4: 0.61 ng/dL (ref 0.60–1.60)

## 2019-03-17 LAB — TSH: TSH: 5.05 u[IU]/mL — ABNORMAL HIGH (ref 0.35–4.50)

## 2019-03-17 NOTE — Patient Instructions (Addendum)
Continue Methimazole 5 mg daily , until your results are back   You will need labs today and in 8 weeks

## 2019-03-17 NOTE — Progress Notes (Signed)
Name: Elizabeth Mcconnell  MRN/ DOB: [id number], 1945/07/07    Age/ Sex: 74 y.o., female     PCP: Janith Lima, MD   Reason for Endocrinology Evaluation: Subclinical hyperthyroidism     Initial Endocrinology Clinic Visit: 12/11/2018    PATIENT IDENTIFIER: Elizabeth Mcconnell is a 74 y.o., female with a past medical history ofHTN, PAF, Asthma and dyslipidemia . She has followed with [facility] since 12/11/2018 for consultative assistance with management of her subclinical hyperthyroidism.   HISTORICAL SUMMARY:  Pt has been diagnosed with subclinical hyperthyroidism since 11/2015 when she was noted to have a  suppressed TSH < 0.01 uIU/mL on routine labs, with a nadir of 0.02 uIU/mL in 10/2018.  An uptake and scan 01/21/2019 showed a homogenous uptake with a normal 24 - hr I-131 uptake at 23.7 She was started on Methimazole 01/2019 due to low TSH and her extensive cardiac history.   Pt has history of paroxysmal A.Fib (S/P ablation 2018 ) and Osteopenia  She has not been on Amiodarone in the past   No FH of thyroid disease.  SUBJECTIVE:   During last visit (12/11/2018): Thyroid uptake and scan performed .   Today (03/18/2019):  Elizabeth Mcconnell is here for a follow up on subclinical hyperthyroidism .  Sister fell , broke foot and has been caring for her.   Since she has been on methimazole with cold intolerance,and  weight gain as well as back pain.  She denies constipation, but has terrible anxiety.  Denies recent fever , vomiting or abdominal pain.    ROS:  As per HPI.   HISTORY:  Past Medical History:  Past Medical History:  Diagnosis Date  . Adenomatous polyp of colon 10/2013   f/u colo 10/2018  . Allergic rhinitis   . ALLERGIC RHINITIS   . ANXIETY    "situational" (02/14/2015)  . Arthritis    "neck, back, hands" (02/14/2015)  . Asthma   . Atrial fibrillation with RVR ([place])   . Atrial flutter ([place])    failed cardioversion 11/2012; s/p ablation  02-02-2013 by Dr Rayann Heman  . Chronic bronchitis ([place])    "I'll have it most years" (02/14/2015)  . Diverticulosis   . Endometriosis   . Essential hypertension 05/30/2014  . Hepatic steatosis   . Oral candidiasis   . Osteopenia 08/2015   T score -1.9 FRAX 16% / 6.5%  . Pneumothorax on left 1996   left lower lung collapse s/p resection  . Small bowel obstruction ([place])   . Tobacco abuse    Past Surgical History:  Past Surgical History:  Procedure Laterality Date  . ABDOMINAL HYSTERECTOMY    . APPENDECTOMY  1978  . ATRIAL FIBRILLATION ABLATION N/A 11/05/2016   Procedure: Atrial Fibrillation Ablation;  Surgeon: Thompson Grayer, MD;  Location: [facility];  Service: Cardiovascular;  Laterality: N/A;  . ATRIAL FLUTTER ABLATION N/A 02/02/2013   Procedure: ATRIAL FLUTTER ABLATION;  Surgeon: Coralyn Mark, MD;  Location: [facility];  Service: Cardiovascular;  Laterality: N/A;  . CARDIOVERSION N/A 11/27/2012   Procedure: CARDIOVERSION;  Surgeon: Thayer Headings, MD;  Location: [facility];  Service: Cardiovascular;  Laterality: N/A;  . [place]; 1978  . LAPAROSCOPIC PARTIAL COLECTOMY N/A 05/04/2015   Procedure: LAPAROSCOPIC LOW ANTERIOR RESECTION LAPAROSCOPIC LYSIS OF ADHESIONS, SPLENIC FLEXURE MOBILIZATION;  Surgeon: Michael Boston, MD;  [facility];  Service: General;  Laterality: N/A;  . LUNG REMOVAL, PARTIAL Left 02/1965   LLL  .  TOTAL ABDOMINAL HYSTERECTOMY W/ BILATERAL SALPINGOOPHORECTOMY  1984   Endometriosis    Social History:  reports that she has been smoking cigarettes. She has been smoking about 0.25 packs per day. She has never used smokeless tobacco. She reports current alcohol use of about 14.0 standard drinks of alcohol per week. She reports that she does not use drugs. Family History:  Family History  Problem Relation Age of Onset  . Asthma Maternal Grandfather   . Stroke Father 56  . Hypertension Father   . Heart disease Father   . Diabetes Mother   .  Asthma Child   . Colon cancer Neg Hx   . Rectal cancer Neg Hx   . Stomach cancer Neg Hx      HOME MEDICATIONS: Allergies as of 03/17/2019      Reactions   Tramadol Other (See Comments)   Reaction:  Dizziness and weakness    Amoxicillin-pot Clavulanate Nausea And Vomiting, Other (See Comments)   Has patient had a PCN reaction causing immediate rash, facial/tongue/throat swelling, SOB or lightheadedness with hypotension: No Has patient had a PCN reaction causing severe rash involving mucus membranes or skin necrosis: No Has patient had a PCN reaction that required hospitalization No Has patient had a PCN reaction occurring within the last 10 years: No If all of the above answers are "NO", then may proceed with Cephalosporin use.   Flagyl [metronidazole] Nausea And Vomiting   Flecainide Other (See Comments)   Dizziness    Omnicef [cefdinir] Nausea And Vomiting   Avelox [moxifloxacin] Hives, Itching      Medication List       Accurate as of March 17, 2019 11:59 PM. If you have any questions, ask your nurse or doctor.        acetaminophen 650 MG CR tablet Commonly known as: TYLENOL Take 1,300 mg by mouth 2 (two) times daily as needed for pain.   ALPRAZolam 0.25 MG tablet Commonly known as: XANAX TAKE 1 TABLET BY MOUTH AT BEDTIME AS NEEDED FOR ANXIETY OR SLEEP   amLODipine 5 MG tablet Commonly known as: NORVASC Take 0.5 tablets (2.5 mg total) by mouth daily.   Bystolic 5 MG tablet Generic drug: nebivolol TAKE 1 TABLET BY MOUTH EVERY DAY   Calcium 600+D 600-800 MG-UNIT Tabs Generic drug: Calcium Carb-Cholecalciferol Take 1 tablet by mouth daily.   dofetilide 250 MCG capsule Commonly known as: TIKOSYN TAKE 1 CAPSULE BY MOUTH 2 TIMES DAILY   Eliquis 5 MG Tabs tablet Generic drug: apixaban TAKE 1 TABLET BY MOUTH 2 TIMES DAILY   estradiol 0.05 MG/24HR patch Commonly known as: VIVELLE-DOT Apply one patch to the skin twice weekly.   fluticasone 110 MCG/ACT inhaler  Commonly known as: Flovent HFA INHALE 2 PUFFS INTO THE LUNGS 2 TIMES DAILY   fluticasone 50 MCG/ACT nasal spray Commonly known as: FLONASE USE 2 SPRAYS IN EACH NOSTRIL 2 TIMES DAILY   levalbuterol 45 MCG/ACT inhaler Commonly known as: XOPENEX HFA inhale 1 TO 2 PUFFS BY MOUTH EVERY 4 HOURS AS NEEDED   Livalo 2 MG Tabs Generic drug: Pitavastatin Calcium TAKE 1 TABLET BY MOUTH EVERY DAY   losartan 100 MG tablet Commonly known as: COZAAR TAKE 1 TABLET BY MOUTH EVERY DAY   magic mouthwash Soln RINSE 5 mls BY MOUTH 3 TO 4 TIMES DAILY AS NEEDED   magnesium oxide 400 MG tablet Commonly known as: MAG-OX Take 1 tablet (400 mg total) by mouth daily.   methimazole 5 MG tablet Commonly known  as: TAPAZOLE Take 1 tablet (5 mg total) by mouth daily.   montelukast 10 MG tablet Commonly known as: SINGULAIR Take 1 tablet (10 mg total) by mouth daily.   potassium chloride 10 MEQ tablet Commonly known as: KLOR-CON TAKE 2 TABLETS BY MOUTH EVERY DAY         OBJECTIVE:   PHYSICAL EXAM: VS: BP 124/78 (BP Location: Left Arm, Patient Position: Sitting, Cuff Size: Normal)   Pulse 66   Temp 98 F (36.7 C)   Ht 5\' 4"  (1.626 m)   Wt 161 lb (73 kg)   SpO2 98%   BMI 27.64 kg/m    EXAM: General: Pt appears well and is in NAD  Neck: General: Supple without adenopathy. Thyroid: Thyroid size normal.  No goiter or nodules appreciated. No thyroid bruit.  Lungs: Clear with good BS bilat with no rales, rhonchi, or wheezes  Heart: Auscultation: RRR.  Abdomen: Normoactive bowel sounds, soft, nontender, without masses or organomegaly palpable  Extremities:  BL LE: No pretibial edema normal ROM and strength.  Mental Status: Judgment, insight: Intact Orientation: Oriented to time, place, and person Mood and affect: No depression, anxiety, or agitation     DATA REVIEWED: Results for SRITHA, SNYDER "Elizabeth Mcconnell" (MRN [id number]) as of 03/18/2019 21:19  Ref. Range 03/17/2019 13:40  TSH  Latest Ref Range: 0.35 - 4.50 uIU/mL 5.05 (H)  T4,Free(Direct) Latest Ref Range: 0.60 - 1.60 ng/dL 0.61      ASSESSMENT / PLAN / RECOMMENDATIONS:   1. Subclinical Hyperthyroidism:   - Based on her uptake and scan more consistent with graves' disease rather then autonomous MNG - She is tolerating methimazole without side effects - NO local neck symtpoms - She is clinically and biochemically euthyroid      Medications  Decrease Methimazole 5 mg to half a tablet daily    F/U in 4 months  Labs in 8 weeks    Signed electronically by: Mack Guise, MD  [facility] Endocrinology  [facility] [address] [place], [place] [address] Phone: [phone] FAX: [phone]      CC: Janith Lima, MD [place] [address] Phone: [phone]  Fax: [phone]   Return to Endocrinology clinic as below: Future Appointments  Date Time Provider [place]  03/25/2019  9:15 AM [facility] [place] [facility]  05/12/2019 11:00 AM [facility] None  07/14/2019  1:00 PM Bharat Antillon, Melanie Crazier, MD [facility] None  09/16/2019 12:00 PM Tanda Rockers, MD [facility] None

## 2019-03-18 ENCOUNTER — Encounter: Payer: Self-pay | Admitting: Internal Medicine

## 2019-03-18 DIAGNOSIS — E0590 Thyrotoxicosis, unspecified without thyrotoxic crisis or storm: Secondary | ICD-10-CM | POA: Insufficient documentation

## 2019-03-18 MED ORDER — METHIMAZOLE 5 MG PO TABS: 2.5000 mg | ORAL_TABLET | Freq: Every day | ORAL | 1 refills | Status: DC

## 2019-03-25 ENCOUNTER — Ambulatory Visit: Payer: Medicare PPO | Attending: Internal Medicine

## 2019-03-25 DIAGNOSIS — Z23 Encounter for immunization: Secondary | ICD-10-CM

## 2019-03-25 NOTE — Progress Notes (Signed)
   Covid-19 Vaccination Clinic  Name:  Elizabeth Mcconnell    MRN: [id number] DOB: 02/27/1946  03/25/2019  [name] was observed post Covid-19 immunization for 15 minutes without incidence. She was provided with Vaccine Information Sheet and instruction to access the V-Safe system.   [name] was instructed to call 911 with any severe reactions post vaccine: [name] Difficulty breathing  . Swelling of your face and throat  . A fast heartbeat  . A bad rash all over your body  . Dizziness and weakness    Immunizations Administered    Name Date Dose VIS Date Route   Pfizer COVID-19 Vaccine 03/25/2019  9:31 AM 0.3 mL 02/12/2019 Intramuscular   Manufacturer: [place]   Lot: [id number]   [place]: [id number]

## 2019-04-15 ENCOUNTER — Ambulatory Visit: Payer: Medicare PPO | Attending: Internal Medicine

## 2019-04-15 DIAGNOSIS — Z23 Encounter for immunization: Secondary | ICD-10-CM | POA: Insufficient documentation

## 2019-04-15 NOTE — Progress Notes (Signed)
   Covid-19 Vaccination Clinic  Name:  Elizabeth Mcconnell    MRN: [id number] DOB: 29-Nov-1945  04/15/2019  [name] was observed post Covid-19 immunization for 15 minutes without incidence. She was provided with Vaccine Information Sheet and instruction to access the V-Safe system.   [name] was instructed to call 911 with any severe reactions post vaccine: [name] Difficulty breathing  . Swelling of your face and throat  . A fast heartbeat  . A bad rash all over your body  . Dizziness and weakness    Immunizations Administered    Name Date Dose VIS Date Route   Pfizer COVID-19 Vaccine 04/15/2019  9:42 AM 0.3 mL 02/12/2019 Intramuscular   Manufacturer: [place]   Lot: [id number]   [place]: [id number]

## 2019-04-16 DIAGNOSIS — H2513 Age-related nuclear cataract, bilateral: Secondary | ICD-10-CM | POA: Diagnosis not present

## 2019-04-16 DIAGNOSIS — H5213 Myopia, bilateral: Secondary | ICD-10-CM | POA: Diagnosis not present

## 2019-04-16 DIAGNOSIS — H25013 Cortical age-related cataract, bilateral: Secondary | ICD-10-CM | POA: Diagnosis not present

## 2019-05-12 ENCOUNTER — Other Ambulatory Visit (HOSPITAL_COMMUNITY): Payer: Self-pay | Admitting: Nurse Practitioner

## 2019-05-12 ENCOUNTER — Other Ambulatory Visit: Payer: Self-pay | Admitting: Internal Medicine

## 2019-05-12 ENCOUNTER — Other Ambulatory Visit: Payer: Self-pay

## 2019-05-12 ENCOUNTER — Other Ambulatory Visit (INDEPENDENT_AMBULATORY_CARE_PROVIDER_SITE_OTHER): Payer: Medicare PPO

## 2019-05-12 DIAGNOSIS — E0590 Thyrotoxicosis, unspecified without thyrotoxic crisis or storm: Secondary | ICD-10-CM | POA: Diagnosis not present

## 2019-05-12 LAB — T4, FREE: Free T4: 0.7 ng/dL (ref 0.60–1.60)

## 2019-05-12 LAB — TSH: TSH: 3.52 u[IU]/mL (ref 0.35–4.50)

## 2019-05-15 ENCOUNTER — Other Ambulatory Visit: Payer: Self-pay | Admitting: Internal Medicine

## 2019-05-15 DIAGNOSIS — E785 Hyperlipidemia, unspecified: Secondary | ICD-10-CM

## 2019-06-01 ENCOUNTER — Other Ambulatory Visit: Payer: Self-pay | Admitting: Internal Medicine

## 2019-06-10 ENCOUNTER — Other Ambulatory Visit: Payer: Self-pay

## 2019-06-10 ENCOUNTER — Ambulatory Visit: Payer: Medicare PPO | Admitting: Internal Medicine

## 2019-06-10 ENCOUNTER — Encounter: Payer: Self-pay | Admitting: Internal Medicine

## 2019-06-10 VITALS — BP 128/78 | HR 72 | Temp 97.6°F | Ht 64.0 in | Wt 161.4 lb

## 2019-06-10 DIAGNOSIS — E0590 Thyrotoxicosis, unspecified without thyrotoxic crisis or storm: Secondary | ICD-10-CM

## 2019-06-10 LAB — T4, FREE: Free T4: 0.65 ng/dL (ref 0.60–1.60)

## 2019-06-10 LAB — TSH: TSH: 5.44 u[IU]/mL — ABNORMAL HIGH (ref 0.35–4.50)

## 2019-06-10 NOTE — Progress Notes (Signed)
Name: Elizabeth Mcconnell  MRN/ DOB: [id number], August 06, 1945    Age/ Sex: 74 y.o., female     PCP: Janith Lima, MD   Reason for Endocrinology Evaluation: Subclinical hyperthyroidism     Initial Endocrinology Clinic Visit: 12/11/2018    PATIENT IDENTIFIER: [name] is a 74 y.o., female with a past medical history ofHTN, PAF, Asthma and dyslipidemia . She has followed with [facility] since 12/11/2018 for consultative assistance with management of her subclinical hyperthyroidism.   HISTORICAL SUMMARY:  Pt has been diagnosed with subclinical hyperthyroidism since 11/2015 when she was noted to have a  suppressed TSH < 0.01 uIU/mL on routine labs, with a nadir of 0.02 uIU/mL in 10/2018.  An uptake and scan 01/21/2019 showed a homogenous uptake with a normal 24 - hr I-131 uptake at 23.7 She was started on Methimazole 01/2019 due to low TSH and her extensive cardiac history.   Pt has history of paroxysmal A.Fib (S/P ablation 2018 ) and Osteopenia  She has not been on Amiodarone in the past   No FH of thyroid disease.  SUBJECTIVE:   During last visit (12/11/2018): Thyroid uptake and scan performed .   Today (06/10/2019):  Ms. [name] is here for a follow up on subclinical hyperthyroidism .   Weight has been stable , but patient complains of weight gain. Denies recent fever, vomiting or abdominal pain.   But she is complaining of loss of appetite, cold intolerance, and severe anxiety she also has noted occasional back aches. No local neck symptoms.  ROS:  As per HPI.   HISTORY:  Past Medical History:  Past Medical History:  Diagnosis Date  . Adenomatous polyp of colon 10/2013   f/u colo 10/2018  . Allergic rhinitis   . ALLERGIC RHINITIS   . ANXIETY    "situational" (02/14/2015)  . Arthritis    "neck, back, hands" (02/14/2015)  . Asthma   . Atrial fibrillation with RVR ([place])   . Atrial flutter ([place])    failed cardioversion 11/2012; s/p  ablation 02-02-2013 by Dr Rayann Heman  . Chronic bronchitis ([place])    "I'll have it most years" (02/14/2015)  . Diverticulosis   . Endometriosis   . Essential hypertension 05/30/2014  . Hepatic steatosis   . Oral candidiasis   . Osteopenia 08/2015   T score -1.9 FRAX 16% / 6.5%  . Pneumothorax on left 1996   left lower lung collapse s/p resection  . Small bowel obstruction ([place])   . Tobacco abuse    Past Surgical History:  Past Surgical History:  Procedure Laterality Date  . ABDOMINAL HYSTERECTOMY    . APPENDECTOMY  1978  . ATRIAL FIBRILLATION ABLATION N/A 11/05/2016   Procedure: Atrial Fibrillation Ablation;  Surgeon: Thompson Grayer, MD;  Location: [facility];  Service: Cardiovascular;  Laterality: N/A;  . ATRIAL FLUTTER ABLATION N/A 02/02/2013   Procedure: ATRIAL FLUTTER ABLATION;  Surgeon: Coralyn Mark, MD;  Location: [facility];  Service: Cardiovascular;  Laterality: N/A;  . CARDIOVERSION N/A 11/27/2012   Procedure: CARDIOVERSION;  Surgeon: Thayer Headings, MD;  Location: [facility];  Service: Cardiovascular;  Laterality: N/A;  . [place]; 1978  . LAPAROSCOPIC PARTIAL COLECTOMY N/A 05/04/2015   Procedure: LAPAROSCOPIC LOW ANTERIOR RESECTION LAPAROSCOPIC LYSIS OF ADHESIONS, SPLENIC FLEXURE MOBILIZATION;  Surgeon: Michael Boston, MD;  [facility];  Service: General;  Laterality: N/A;  . LUNG REMOVAL, PARTIAL Left 02/1965   LLL  . TOTAL ABDOMINAL HYSTERECTOMY W/  BILATERAL SALPINGOOPHORECTOMY  1984   Endometriosis    Social History:  reports that she has been smoking cigarettes. She has been smoking about 0.25 packs per day. She has never used smokeless tobacco. She reports current alcohol use of about 14.0 standard drinks of alcohol per week. She reports that she does not use drugs. Family History:  Family History  Problem Relation Age of Onset  . Asthma Maternal Grandfather   . Stroke Father 43  . Hypertension Father   . Heart disease Father   . Diabetes  Mother   . Asthma Child   . Colon cancer Neg Hx   . Rectal cancer Neg Hx   . Stomach cancer Neg Hx      HOME MEDICATIONS: Allergies as of 06/10/2019      Reactions   Tramadol Other (See Comments)   Reaction:  Dizziness and weakness    Amoxicillin-pot Clavulanate Nausea And Vomiting, Other (See Comments)   Has patient had a PCN reaction causing immediate rash, facial/tongue/throat swelling, SOB or lightheadedness with hypotension: No Has patient had a PCN reaction causing severe rash involving mucus membranes or skin necrosis: No Has patient had a PCN reaction that required hospitalization No Has patient had a PCN reaction occurring within the last 10 years: No If all of the above answers are "NO", then may proceed with Cephalosporin use.   Flagyl [metronidazole] Nausea And Vomiting   Flecainide Other (See Comments)   Dizziness    Omnicef [cefdinir] Nausea And Vomiting   Avelox [moxifloxacin] Hives, Itching      Medication List       Accurate as of June 10, 2019  9:54 AM. If you have any questions, ask your nurse or doctor.        acetaminophen 650 MG CR tablet Commonly known as: TYLENOL Take 1,300 mg by mouth 2 (two) times daily as needed for pain.   ALPRAZolam 0.25 MG tablet Commonly known as: XANAX TAKE 1 TABLET BY MOUTH AT BEDTIME AS NEEDED FOR ANXIETY OR SLEEP   amLODipine 5 MG tablet Commonly known as: NORVASC Take 0.5 tablets (2.5 mg total) by mouth daily. Follow-Up with Dr. Rayann Heman   Bystolic 5 MG tablet Generic drug: nebivolol TAKE 1 TABLET BY MOUTH EVERY DAY   Calcium 600+D 600-800 MG-UNIT Tabs Generic drug: Calcium Carb-Cholecalciferol Take 1 tablet by mouth daily.   dofetilide 250 MCG capsule Commonly known as: TIKOSYN TAKE 1 CAPSULE BY MOUTH 2 TIMES DAILY   Eliquis 5 MG Tabs tablet Generic drug: apixaban TAKE 1 TABLET BY MOUTH 2 TIMES DAILY   estradiol 0.05 MG/24HR patch Commonly known as: VIVELLE-DOT Apply one patch to the skin twice weekly.     Flovent HFA 110 MCG/ACT inhaler Generic drug: fluticasone INHALE 2 PUFFS INTO THE LUNGS 2 TIMES DAILY   fluticasone 50 MCG/ACT nasal spray Commonly known as: FLONASE USE 2 SPRAYS IN EACH NOSTRIL 2 TIMES DAILY   levalbuterol 45 MCG/ACT inhaler Commonly known as: XOPENEX HFA inhale 1 TO 2 PUFFS BY MOUTH EVERY 4 HOURS AS NEEDED   Livalo 2 MG Tabs Generic drug: Pitavastatin Calcium TAKE 1 TABLET BY MOUTH EVERY DAY   losartan 100 MG tablet Commonly known as: COZAAR TAKE 1 TABLET BY MOUTH EVERY DAY   magic mouthwash Soln RINSE 5 mls BY MOUTH 3 TO 4 TIMES DAILY AS NEEDED   magnesium oxide 400 MG tablet Commonly known as: MAG-OX Take 1 tablet (400 mg total) by mouth daily.   methimazole 5 MG tablet Commonly  known as: TAPAZOLE Take 0.5 tablets (2.5 mg total) by mouth daily.   montelukast 10 MG tablet Commonly known as: SINGULAIR Take 1 tablet (10 mg total) by mouth daily.   potassium chloride 10 MEQ tablet Commonly known as: KLOR-CON TAKE 2 TABLETS BY MOUTH EVERY DAY         OBJECTIVE:   PHYSICAL EXAM: VS: BP 128/78 (BP Location: Left Arm, Patient Position: Sitting, Cuff Size: Normal)   Pulse 72   Temp 97.6 F (36.4 C)   Ht 5\' 4"  (1.626 m)   Wt 161 lb 6.4 oz (73.2 kg)   SpO2 97%   BMI 27.70 kg/m    EXAM: General: Pt appears well and is in NAD  Neck: General: Supple without adenopathy. Thyroid: Thyroid size normal.  No goiter or nodules appreciated. No thyroid bruit.  Lungs: Clear with good BS bilat with no rales, rhonchi, or wheezes  Heart: Auscultation: RRR.  Abdomen: Normoactive bowel sounds, soft, nontender, without masses or organomegaly palpable  Extremities:  BL LE: No pretibial edema normal ROM and strength.  Mental Status: Judgment, insight: Intact Orientation: Oriented to time, place, and person Mood and affect: No depression, anxiety, or agitation     DATA REVIEWED: Results for ATHENEA, SNITKER "[name]" (MRN [id number]) as of  06/11/2019 08:46  Ref. Range 06/10/2019 10:19  TSH Latest Ref Range: 0.35 - 4.50 uIU/mL 5.44 (H)  T4,Free(Direct) Latest Ref Range: 0.60 - 1.60 ng/dL 0.65     ASSESSMENT / PLAN / RECOMMENDATIONS:   1. Subclinical Hyperthyroidism:   - Based on her uptake and scan more consistent with graves' disease rather then autonomous MNG -Patient with multiple nonspecific symptoms that she attributes to methimazole.,  She is currently on a small dose of 2.5 mg daily but given repeat TSH elevated again at 5.44 uIU/mL we have decided to stop that methimazole and recheck on the next visit and hope that her thyroid condition remains in remission. - No local neck symtpoms     Medications  Stop methimazole   F/U in 3 months   Addendum: A my chart message was sent on 06/11/2018 to stop the methimazole and to contact us with any hyperthyroid symptoms.  Signed electronically by: Mack Guise, MD  [facility]  [facility] [address] [place], [place] [address] Phone: [phone] FAX: [phone]      CC: Janith Lima, MD [place] [address] Phone: [phone]  Fax: [phone]   Return to Endocrinology clinic as below: Future Appointments  Date Time Provider [place]  06/24/2019 11:00 AM Thompson Grayer, MD [facility]  09/16/2019 12:00 PM Tanda Rockers, MD [facility] None

## 2019-06-10 NOTE — Patient Instructions (Signed)
- 

## 2019-06-17 ENCOUNTER — Other Ambulatory Visit: Payer: Self-pay | Admitting: Internal Medicine

## 2019-06-17 DIAGNOSIS — Z23 Encounter for immunization: Secondary | ICD-10-CM

## 2019-06-24 ENCOUNTER — Ambulatory Visit: Payer: Medicare PPO | Admitting: Internal Medicine

## 2019-06-24 ENCOUNTER — Other Ambulatory Visit: Payer: Self-pay

## 2019-06-24 ENCOUNTER — Encounter: Payer: Self-pay | Admitting: Internal Medicine

## 2019-06-24 VITALS — BP 114/78 | HR 74 | Ht 64.0 in | Wt 159.0 lb

## 2019-06-24 DIAGNOSIS — E039 Hypothyroidism, unspecified: Secondary | ICD-10-CM | POA: Diagnosis not present

## 2019-06-24 DIAGNOSIS — I10 Essential (primary) hypertension: Secondary | ICD-10-CM

## 2019-06-24 DIAGNOSIS — I480 Paroxysmal atrial fibrillation: Secondary | ICD-10-CM | POA: Diagnosis not present

## 2019-06-24 NOTE — Patient Instructions (Addendum)
Medication Instructions:  Your physician recommends that you continue on your current medications as directed. Please refer to the Current Medication list given to you today.  Labwork: You will get lab work today:  BMP and magnesium  Testing/Procedures: None ordered.  Follow-Up: Your physician wants you to follow-up in: 6 months with Roderic Palau at the AFIB clinic.   You will receive a reminder letter in the mail two months in advance. If you don't receive a letter, please call our office to schedule the follow-up appointment.  Any Other Special Instructions Will Be Listed Below (If  Applicable).  If you need a refill on your cardiac medications before your next appointment, please call your pharmacy.   Referral placed to Dr. [name], endocrinology Address: [address], [place], [place] [address] Phone: [phone]

## 2019-06-24 NOTE — Progress Notes (Signed)
PCP: Janith Lima, MD   Primary EP: Dr Amador Cunas is a 74 y.o. female who presents today for routine electrophysiology followup.  Since last being seen in our clinic, the patient reports doing reasonably well. Her afib is well controlled.  She is primarily concerned about hyperthyroidism today.  She did not tolerate methimazole.  She is very concerned about this.  Today, she denies symptoms of palpitations, chest pain, shortness of breath,  lower extremity edema, dizziness, presyncope, or syncope.  The patient is otherwise without complaint today.   Past Medical History:  Diagnosis Date  . Adenomatous polyp of colon 10/2013   f/u colo 10/2018  . Allergic rhinitis   . ALLERGIC RHINITIS   . ANXIETY    "situational" (02/14/2015)  . Arthritis    "neck, back, hands" (02/14/2015)  . Asthma   . Atrial fibrillation with RVR ([place])   . Atrial flutter ([place])    failed cardioversion 11/2012; s/p ablation 02-02-2013 by Dr Rayann Heman  . Chronic bronchitis ([place])    "I'll have it most years" (02/14/2015)  . Diverticulosis   . Endometriosis   . Essential hypertension 05/30/2014  . Hepatic steatosis   . Oral candidiasis   . Osteopenia 08/2015   T score -1.9 FRAX 16% / 6.5%  . Pneumothorax on left 1996   left lower lung collapse s/p resection  . Small bowel obstruction ([place])   . Tobacco abuse    Past Surgical History:  Procedure Laterality Date  . ABDOMINAL HYSTERECTOMY    . APPENDECTOMY  1978  . ATRIAL FIBRILLATION ABLATION N/A 11/05/2016   Procedure: Atrial Fibrillation Ablation;  Surgeon: Thompson Grayer, MD;  Location: [facility];  Service: Cardiovascular;  Laterality: N/A;  . ATRIAL FLUTTER ABLATION N/A 02/02/2013   Procedure: ATRIAL FLUTTER ABLATION;  Surgeon: Coralyn Mark, MD;  Location: [facility];  Service: Cardiovascular;  Laterality: N/A;  . CARDIOVERSION N/A 11/27/2012   Procedure: CARDIOVERSION;  Surgeon: Thayer Headings, MD;  Location: [facility];  Service:  Cardiovascular;  Laterality: N/A;  . [place]; 1978  . LAPAROSCOPIC PARTIAL COLECTOMY N/A 05/04/2015   Procedure: LAPAROSCOPIC LOW ANTERIOR RESECTION LAPAROSCOPIC LYSIS OF ADHESIONS, SPLENIC FLEXURE MOBILIZATION;  Surgeon: Michael Boston, MD;  [facility];  Service: General;  Laterality: N/A;  . LUNG REMOVAL, PARTIAL Left 02/1965   LLL  . TOTAL ABDOMINAL HYSTERECTOMY W/ BILATERAL SALPINGOOPHORECTOMY  1984   Endometriosis    ROS- all systems are reviewed and negatives except as per HPI above  Current Outpatient Medications  Medication Sig Dispense Refill  . acetaminophen (TYLENOL) 650 MG CR tablet Take 1,300 mg by mouth 2 (two) times daily as needed for pain.     [name] ALPRAZolam (XANAX) 0.25 MG tablet TAKE 1 TABLET BY MOUTH AT BEDTIME AS NEEDED FOR ANXIETY OR SLEEP 30 tablet 3  . amLODipine (NORVASC) 5 MG tablet Take 0.5 tablets (2.5 mg total) by mouth daily. Follow-Up with Dr. Rayann Heman 45 tablet 0  . BYSTOLIC 5 MG tablet TAKE 1 TABLET BY MOUTH EVERY DAY 90 tablet 1  . Calcium Carb-Cholecalciferol (CALCIUM 600+D) 600-800 MG-UNIT TABS Take 1 tablet by mouth daily.    [name] dofetilide (TIKOSYN) 250 MCG capsule TAKE 1 CAPSULE BY MOUTH 2 TIMES DAILY 180 capsule 2  . ELIQUIS 5 MG TABS tablet TAKE 1 TABLET BY MOUTH 2 TIMES DAILY 180 tablet 2  . estradiol (VIVELLE-DOT) 0.05 MG/24HR patch Apply one patch to the skin twice weekly. 24 patch  0  . fluticasone (FLONASE) 50 MCG/ACT nasal spray USE 2 SPRAYS IN EACH NOSTRIL 2 TIMES DAILY 16 g 5  . fluticasone (FLOVENT HFA) 110 MCG/ACT inhaler INHALE 2 PUFFS INTO THE LUNGS 2 TIMES DAILY 12 g 5  . levalbuterol (XOPENEX HFA) 45 MCG/ACT inhaler inhale 1 TO 2 PUFFS BY MOUTH EVERY 4 HOURS AS NEEDED 15 g 3  . LIVALO 2 MG TABS TAKE 1 TABLET BY MOUTH EVERY DAY 90 tablet 1  . losartan (COZAAR) 100 MG tablet TAKE 1 TABLET BY MOUTH EVERY DAY 30 tablet 6  . magic mouthwash SOLN RINSE 5 mls BY MOUTH 3 TO 4 TIMES DAILY AS NEEDED    . magnesium oxide (MAG-OX) 400  MG tablet Take 1 tablet (400 mg total) by mouth daily. 30 tablet 6  . montelukast (SINGULAIR) 10 MG tablet Take 1 tablet (10 mg total) by mouth daily. 90 tablet 3  . potassium chloride (KLOR-CON) 10 MEQ tablet TAKE 2 TABLETS BY MOUTH EVERY DAY 60 tablet 3   No current facility-administered medications for this visit.    Physical Exam: Vitals:   06/24/19 1452  BP: 114/78  Pulse: 74  SpO2: 97%  Weight: 159 lb (72.1 kg)  Height: 5\' 4"  (1.626 m)    GEN- The patient is well appearing, alert and oriented x 3 today.   Head- normocephalic, atraumatic Eyes-  Sclera clear, conjunctiva pink Ears- hearing intact Oropharynx- clear Lungs-  normal work of breathing Heart- Regular rate and rhythm  GI- soft  Extremities- no clubbing, cyanosis, or edema  Wt Readings from Last 3 Encounters:  06/24/19 159 lb (72.1 kg)  06/10/19 161 lb 6.4 oz (73.2 kg)  03/17/19 161 lb (73 kg)    EKG tracing ordered today is personally reviewed and shows sinus rhythm with PACs  Assessment and Plan:  1. Paroxysmal atrial fibrillation Doing well with tikosyn We will obtain bmet, mg today chads2vasc score is 4.  Continue eliquis The importance of close follow-up to avoid toxicity with Phyllis Ginger was discussed today  2. HTN Stable No change required today  3. Hyperthyroidism She wishes to consider a second option.  I discussed Dr Forde Dandy as a possible second opinion which she will consider.  I have reassured her that she is currently receiving appropriate treatment.  Return to AF clinic in 6 months I will see in a year  Thompson Grayer MD, [facility] 06/24/2019 3:38 PM

## 2019-06-25 LAB — BASIC METABOLIC PANEL
BUN/Creatinine Ratio: 15 (ref 12–28)
BUN: 13 mg/dL (ref 8–27)
CO2: 23 mmol/L (ref 20–29)
Calcium: 9.8 mg/dL (ref 8.7–10.3)
Chloride: 104 mmol/L (ref 96–106)
Creatinine, Ser: 0.86 mg/dL (ref 0.57–1.00)
GFR calc Af Amer: 76 mL/min/{1.73_m2} (ref >59)
GFR calc non Af Amer: 66 mL/min/{1.73_m2} (ref >59)
Glucose: 90 mg/dL (ref 65–99)
Potassium: 4.2 mmol/L (ref 3.5–5.2)
Sodium: 143 mmol/L (ref 134–144)

## 2019-06-25 LAB — MAGNESIUM: Magnesium: 2.1 mg/dL (ref 1.6–2.3)

## 2019-07-14 ENCOUNTER — Ambulatory Visit: Payer: Medicare PPO | Admitting: Internal Medicine

## 2019-07-14 ENCOUNTER — Other Ambulatory Visit (HOSPITAL_COMMUNITY): Payer: Self-pay | Admitting: Nurse Practitioner

## 2019-07-14 ENCOUNTER — Other Ambulatory Visit: Payer: Self-pay | Admitting: Internal Medicine

## 2019-07-14 ENCOUNTER — Telehealth: Payer: Self-pay | Admitting: Internal Medicine

## 2019-07-14 DIAGNOSIS — I10 Essential (primary) hypertension: Secondary | ICD-10-CM

## 2019-07-14 NOTE — Telephone Encounter (Signed)
Pt is due for a visit. Can send in refill once an appointment is made.

## 2019-07-14 NOTE — Telephone Encounter (Signed)
VM for patient to call back to schedule OV to get refills.

## 2019-07-16 NOTE — Telephone Encounter (Signed)
Called pt, [place] will have to call back to schedule her appointment.

## 2019-07-26 ENCOUNTER — Other Ambulatory Visit: Payer: Self-pay

## 2019-07-26 ENCOUNTER — Ambulatory Visit: Payer: Medicare PPO | Admitting: Internal Medicine

## 2019-07-26 ENCOUNTER — Encounter: Payer: Self-pay | Admitting: Internal Medicine

## 2019-07-26 VITALS — BP 144/80 | HR 72 | Temp 99.1°F | Ht 64.0 in | Wt 157.0 lb

## 2019-07-26 DIAGNOSIS — R251 Tremor, unspecified: Secondary | ICD-10-CM | POA: Diagnosis not present

## 2019-07-26 DIAGNOSIS — R42 Dizziness and giddiness: Secondary | ICD-10-CM | POA: Insufficient documentation

## 2019-07-26 DIAGNOSIS — E538 Deficiency of other specified B group vitamins: Secondary | ICD-10-CM

## 2019-07-26 DIAGNOSIS — R7989 Other specified abnormal findings of blood chemistry: Secondary | ICD-10-CM

## 2019-07-26 LAB — COMPREHENSIVE METABOLIC PANEL
ALT: 27 U/L (ref 0–35)
AST: 25 U/L (ref 0–37)
Albumin: 4.6 g/dL (ref 3.5–5.2)
Alkaline Phosphatase: 87 U/L (ref 39–117)
BUN: 13 mg/dL (ref 6–23)
CO2: 28 mEq/L (ref 19–32)
Calcium: 10.1 mg/dL (ref 8.4–10.5)
Chloride: 104 mEq/L (ref 96–112)
Creatinine, Ser: 0.84 mg/dL (ref 0.40–1.20)
GFR: 65.95 mL/min (ref >60.00)
Glucose, Bld: 103 mg/dL — ABNORMAL HIGH (ref 70–99)
Potassium: 4.4 mEq/L (ref 3.5–5.1)
Sodium: 139 mEq/L (ref 135–145)
Total Bilirubin: 0.9 mg/dL (ref 0.2–1.2)
Total Protein: 7.3 g/dL (ref 6.0–8.3)

## 2019-07-26 LAB — T4, FREE: Free T4: 0.89 ng/dL (ref 0.60–1.60)

## 2019-07-26 LAB — TSH: TSH: 1.62 u[IU]/mL (ref 0.35–4.50)

## 2019-07-26 LAB — CBC
HCT: 41.3 % (ref 36.0–46.0)
Hemoglobin: 13.7 g/dL (ref 12.0–15.0)
MCHC: 33.2 g/dL (ref 30.0–36.0)
MCV: 93.1 fl (ref 78.0–100.0)
Platelets: 175 10*3/uL (ref 150.0–400.0)
RBC: 4.43 Mil/uL (ref 3.87–5.11)
RDW: 13.4 % (ref 11.5–15.5)
WBC: 7.6 10*3/uL (ref 4.0–10.5)

## 2019-07-26 LAB — VITAMIN B12: Vitamin B-12: 377 pg/mL (ref 211–911)

## 2019-07-26 LAB — VITAMIN D 25 HYDROXY (VIT D DEFICIENCY, FRACTURES): VITD: 42.86 ng/mL (ref 30.00–100.00)

## 2019-07-26 NOTE — Assessment & Plan Note (Signed)
Slightly confusing description of mixed lightheadedness and vertigo symptoms. Could be related to recent cessation of methimazole. She is not in A fib on exam. She is a smoker and we talked about risk of stroke. If labs normal will order CT head. She is outside window for tPA even if stroke due to 36 hours since onset symptoms (longer from last normal). Checking thyroid, vitamin, CMP, CBC. Adjust as needed.

## 2019-07-26 NOTE — Assessment & Plan Note (Signed)
Feels this is related to her thyroid problems. Checking TSH and free T4 today. Also checking other labs to assess as none checked since onset of symptoms 2-3 months ago.

## 2019-07-26 NOTE — Patient Instructions (Signed)
We will check the labs today and call you back about the results.   We have given you the exercises to try if all blood work is normal.   We can consider doing a CT scan of the brain to check for any stroke although this is a low probability.

## 2019-07-26 NOTE — Assessment & Plan Note (Signed)
Not currently on supplement, checking level today.

## 2019-07-26 NOTE — Progress Notes (Signed)
Subjective:   Patient ID: Elizabeth Mcconnell, female    DOB: Jan 05, 1946, 74 y.o.   MRN: [id number]  HPI The patient is a 74 YO female coming in for concerns about depression (started with starting thyroid medication several months ago, now is of methamizole since 06/10/19, denies much improvement, denies SI/HI) and shakiness (prickly feeling goes up neck to head in the back, has been happening since on thyroid medication months ago, worse in the last several days, stopped thyroid medication about 1-2 months ago due to high TSH levels, denies recent illness, change in diet) and dizziness (started at [employer] when she got up to go to bathroom about 36 hours ago, she was able to make it to the bathroom and then went back to bed, woke up feeling slightly improved but still not right, called friends and they came over, encouraged her to drink plenty of fluids, called the office and was advised to come in within 24 hours, she has had some improvement with eating and drinking, no nausea or vomiting, poor appetite since onset, does also have shaking and tremors inside worse during this time, denies numbness or weakness, no facial drooping, no speech changes, today has mild dizziness but not like when it started).   Review of Systems  Constitutional: Positive for activity change and appetite change.  HENT: Negative.   Eyes: Negative.   Respiratory: Negative for cough, chest tightness and shortness of breath.   Cardiovascular: Negative for chest pain, palpitations and leg swelling.  Gastrointestinal: Negative for abdominal distention, abdominal pain, anal bleeding, blood in stool, constipation, diarrhea, nausea and vomiting.  Musculoskeletal: Negative.   Skin: Negative.   Neurological: Positive for dizziness, tremors, weakness and light-headedness. Negative for seizures, syncope, facial asymmetry, speech difficulty, numbness and headaches.  Psychiatric/Behavioral: Negative.     Objective:  Physical  Exam Constitutional:      Appearance: She is well-developed.  HENT:     Head: Normocephalic and atraumatic.     Right Ear: Tympanic membrane and ear canal normal.     Left Ear: Tympanic membrane and ear canal normal.  Neck:     Comments: No thyroid tenderness or swelling on exam Cardiovascular:     Rate and Rhythm: Normal rate and regular rhythm.  Pulmonary:     Effort: Pulmonary effort is normal. No respiratory distress.     Breath sounds: Normal breath sounds. No wheezing or rales.  Abdominal:     General: Bowel sounds are normal. There is no distension.     Palpations: Abdomen is soft.     Tenderness: There is no abdominal tenderness. There is no rebound.  Musculoskeletal:     Cervical back: Normal range of motion.  Skin:    General: Skin is warm and dry.  Neurological:     Mental Status: She is alert and oriented to person, place, and time.     Coordination: Coordination normal.     Comments: Some dizziness with head turn, no lightheadedness with standing     Vitals:   07/26/19 1458  BP: (!) 144/80  Pulse: 72  Temp: 99.1 F (37.3 C)  SpO2: 99%  Weight: 157 lb (71.2 kg)  Height: 5\' 4"  (1.626 m)    This visit occurred during the SARS-CoV-2 public health emergency.  Safety protocols were in place, including screening questions prior to the visit, additional usage of staff PPE, and extensive cleaning of exam room while observing appropriate contact time as indicated for disinfecting solutions.   Assessment & Plan:

## 2019-07-26 NOTE — Assessment & Plan Note (Signed)
Checking TSH and free T4 as she is off methimazole about 6 weeks now. She is having worsening symptoms since this time.

## 2019-07-27 ENCOUNTER — Ambulatory Visit: Payer: Medicare PPO | Admitting: Internal Medicine

## 2019-07-28 DIAGNOSIS — E0590 Thyrotoxicosis, unspecified without thyrotoxic crisis or storm: Secondary | ICD-10-CM | POA: Diagnosis not present

## 2019-07-28 DIAGNOSIS — Z1331 Encounter for screening for depression: Secondary | ICD-10-CM | POA: Diagnosis not present

## 2019-07-28 DIAGNOSIS — I480 Paroxysmal atrial fibrillation: Secondary | ICD-10-CM | POA: Diagnosis not present

## 2019-07-28 DIAGNOSIS — D126 Benign neoplasm of colon, unspecified: Secondary | ICD-10-CM | POA: Diagnosis not present

## 2019-07-29 ENCOUNTER — Other Ambulatory Visit: Payer: Self-pay | Admitting: Pharmacist

## 2019-07-29 ENCOUNTER — Other Ambulatory Visit: Payer: Self-pay | Admitting: Internal Medicine

## 2019-07-29 DIAGNOSIS — I10 Essential (primary) hypertension: Secondary | ICD-10-CM

## 2019-07-29 DIAGNOSIS — E785 Hyperlipidemia, unspecified: Secondary | ICD-10-CM

## 2019-07-29 MED ORDER — APIXABAN 5 MG PO TABS: 5.0000 mg | ORAL_TABLET | Freq: Two times a day (BID) | ORAL | 1 refills | Status: DC

## 2019-07-29 NOTE — Progress Notes (Signed)
afib indication, last OV April 2021, age 74, weight 71kg, SCr 0.84 on 07/26/19

## 2019-08-03 ENCOUNTER — Other Ambulatory Visit: Payer: Self-pay | Admitting: Endocrinology

## 2019-08-03 DIAGNOSIS — E0590 Thyrotoxicosis, unspecified without thyrotoxic crisis or storm: Secondary | ICD-10-CM

## 2019-08-04 ENCOUNTER — Other Ambulatory Visit: Payer: Self-pay | Admitting: Internal Medicine

## 2019-08-13 ENCOUNTER — Other Ambulatory Visit: Payer: Self-pay | Admitting: Internal Medicine

## 2019-08-13 ENCOUNTER — Ambulatory Visit
Admission: RE | Admit: 2019-08-13 | Discharge: 2019-08-13 | Disposition: A | Discharge status: Home / Self Care | Payer: Medicare PPO | Source: Ambulatory Visit | Attending: Endocrinology | Admitting: Endocrinology

## 2019-08-13 DIAGNOSIS — E0590 Thyrotoxicosis, unspecified without thyrotoxic crisis or storm: Secondary | ICD-10-CM

## 2019-08-13 DIAGNOSIS — E041 Nontoxic single thyroid nodule: Secondary | ICD-10-CM | POA: Diagnosis not present

## 2019-08-13 DIAGNOSIS — F411 Generalized anxiety disorder: Secondary | ICD-10-CM

## 2019-09-09 DIAGNOSIS — I700 Atherosclerosis of aorta: Secondary | ICD-10-CM | POA: Diagnosis not present

## 2019-09-09 DIAGNOSIS — I480 Paroxysmal atrial fibrillation: Secondary | ICD-10-CM | POA: Diagnosis not present

## 2019-09-09 DIAGNOSIS — E7849 Other hyperlipidemia: Secondary | ICD-10-CM | POA: Diagnosis not present

## 2019-09-09 DIAGNOSIS — E0590 Thyrotoxicosis, unspecified without thyrotoxic crisis or storm: Secondary | ICD-10-CM | POA: Diagnosis not present

## 2019-09-09 DIAGNOSIS — M859 Disorder of bone density and structure, unspecified: Secondary | ICD-10-CM | POA: Diagnosis not present

## 2019-09-09 DIAGNOSIS — D126 Benign neoplasm of colon, unspecified: Secondary | ICD-10-CM | POA: Diagnosis not present

## 2019-09-10 ENCOUNTER — Ambulatory Visit: Payer: Medicare PPO | Admitting: Internal Medicine

## 2019-09-16 ENCOUNTER — Ambulatory Visit: Payer: Medicare Other | Admitting: Internal Medicine

## 2019-09-17 ENCOUNTER — Other Ambulatory Visit: Payer: Self-pay | Admitting: Internal Medicine

## 2019-09-17 ENCOUNTER — Other Ambulatory Visit (HOSPITAL_COMMUNITY): Payer: Self-pay | Admitting: Internal Medicine

## 2019-09-17 DIAGNOSIS — I10 Essential (primary) hypertension: Secondary | ICD-10-CM

## 2019-09-22 DIAGNOSIS — R109 Unspecified abdominal pain: Secondary | ICD-10-CM | POA: Diagnosis not present

## 2019-10-07 ENCOUNTER — Telehealth: Payer: Self-pay | Admitting: Internal Medicine

## 2019-10-07 MED ORDER — FLUTICASONE PROPIONATE 50 MCG/ACT NA SUSP: NASAL | 5 refills | Status: DC

## 2019-10-07 NOTE — Telephone Encounter (Signed)
Refill of Flonase sent to preferred pharmacy.

## 2019-10-22 ENCOUNTER — Other Ambulatory Visit: Payer: Self-pay

## 2019-10-22 ENCOUNTER — Ambulatory Visit: Payer: Medicare PPO | Admitting: Internal Medicine

## 2019-10-22 ENCOUNTER — Encounter: Payer: Self-pay | Admitting: Internal Medicine

## 2019-10-22 DIAGNOSIS — J4540 Moderate persistent asthma, uncomplicated: Secondary | ICD-10-CM | POA: Diagnosis not present

## 2019-10-22 NOTE — Assessment & Plan Note (Signed)
Onset all her life Spirometry  09/25/15 FEV1 1.58  (70%)  Ratio 55 on flovent 110 2bid  PFT  09/25/15:  FEV1 1.58 L (70%) FEV1/FVC 0.55   73% DLCO uncorrected 58% - PFT's  07/01/2017  FEV1 1.71 (77 % ) ratio 62  p 6 % improvement from saba p only flovent 110 prior to study with DLCO  60 % corrects to 72  % for alv volume   - 07/01/2017  After extensive coaching inhaler device  effectiveness =    90%   Still doing well on low dose flovent 110 one bid with option of 2 bid dosing for any flare and approp rx with sab prn  So All goals of chronic asthma control met including optimal function and elimination of symptoms with minimal need for rescue therapy.  Contingencies discussed in full including contacting this office immediately if not controlling the symptoms using the rule of two's.      F/u yearly is all she needs for now.          Each maintenance medication was reviewed in detail including emphasizing most importantly the difference between maintenance and prns and under what circumstances the prns are to be triggered using an action plan format where appropriate.  Total time for H and P, chart review, counseling, reviewing  device and generating customized AVS unique to this office visit / charting = 17 min

## 2019-10-22 NOTE — Progress Notes (Signed)
Subjective:   Patient ID: Elizabeth Mcconnell, female    DOB: 03-09-1945    MRN: [id number]  Brief patient profile:  76 yowf active smoker with "asthma all her life" previous pt of Dr Joya Gaskins and  Ashok Cordia with moderate asthma criteria vs copd GOLD II as of pfts 07/01/2017       History of Present Illness  03/25/2016 acute extended ov/Elizabeth Mcconnell re:  Chronic asthma maint flovent hfa ? Still smoking ?  Chief Complaint  Patient presents with  . Acute Visit    Pt c/o chest and sinus congestion for the past 2 wks. She feels very tired and states that she has chills "all the time"- no fever. She has had a prod cough with grey sputum.    baseline doing fine on  flovent hfa / singulair not needing any rescue with little flare of cough attributes to  Uri dec 2017 took doxy and all better until 2 weeks prior to OV with cough thick grey mucus x 2 weeks worse in ams with lots of sinus congestion and grey mucus, some chills but no fever / sweats - even with flare not using xopenex and not sure how much she can "maybe 2 pffs a day"  rec Doxycycline 100 mg twice daily x 10 days Prednisone 10 mg take  4 each am x 2 days,   2 each am x 2 days,  1 each am x 2 days and stop  Work on inhaler technique: For cough / congestion mucinex 1200 mg every 12 hours as needed and use the xopenex hfa more regularly to improve the mucus flow     02/19/2017 acute extended ov/Elizabeth Mcconnell re: acute cough  Chief Complaint  Patient presents with  . Acute Visit    Pt c/o cough for the past 3 days. She also c/o tightness in her throat and upper chest. She has had fatigue and chills also.   maint on flovent 2bid and rarely xopenex only a few times a day even when flare for cough or chest tightness assoc with nasal congestion but no sob rec Doxycycline 100 mg twice daily x 10 days Prednisone 10 mg take  4 each am x 2 days,   2 each am x 2 days,  1 each am x 2 days and stop  Work on inhaler technique:   For cough / congestion mucinex  Or  mucinex dm 1200 mg every 12 hours as needed and use the xopenex hfa more regularly to improve the mucus flow  And use Tylenol #3 as needed when can afford to be sleepy Try prilosec otc 20mg   Take 30-60 min before first meal of the day and Pepcid ac (famotidine) 20 mg one @  bedtime until cough is completely gone for at least a week without the need for cough suppression GERD  Diet      04/02/2017  f/u ov/Elizabeth Mcconnell re: moderate asthma/ cough  Chief Complaint  Patient presents with  . Follow-up    Cough had improved, but then worsened again 4 days ago. She feels some tightness in her throat. Cough has been prod with thick, grey sputum.  She is also having some SOB since cough started back.  She has used her xopenex for rescue a few times since she started feeling bad again.   was all better then acutely worse  since 03/30/17 with nasal congestion/ sneezing then cough/ wheeze and sob  Dyspnea:  Not needing xopenex at all at baseline / only twice  daily since acutely ill and not sob at rest p xopenex prn Cough: grey mucus/ worse at hs  Sleep: after xanax does fine/ did not do well with Tyl #3  rec Doxycycline 100 mg twice daily x 10 days Prednisone 10 mg take  4 each am x 2 days,   2 each am x 2 days,  1 each am x 2 days and stop  For cough / congestion mucinex  Or mucinex dm 1200 mg every 12 hours as needed and use the xopenex hfa more regularly to improve the mucus flow   - up to 2 puffs every 4 hours as needed  Try prilosec otc 20mg   Take 30-60 min before first meal of the day and Pepcid ac (famotidine) 20 mg one @  bedtime until cough is completely gone for at least a week without the need for cough suppression    07/01/2017  f/u ov/Elizabeth Mcconnell re  moderate asthma  Lifelong maint on flovent 110 one bid  Chief Complaint  Patient presents with  . Follow-up    PFT's done today. Breathing is doing well and not coughing at this time. She rarely uses her xopenex.   Dyspnea:  MMRC1 = can walk nl pace, flat  grade, can't hurry or go uphills or steps s sob   Cough: gone Sleep: flat   rec The key is to stop smoking completely before smoking completely stops you!     10/22/2019  f/u ov/Elizabeth Mcconnell re: moderate chronic asthma on flovent 110 one bid / smoker  Chief Complaint  Patient presents with  . Follow-up    no complaints    Dyspnea:  Avoids heat /humidity /  no longer walking neighborhood/ ok doing water aerobics Cough: none  Sleeping: bed flat/ one pillow  SABA use: once a week at most  02: none   No obvious day to day or daytime variability or assoc excess/ purulent sputum or mucus plugs or hemoptysis or cp or chest tightness, subjective wheeze or overt sinus or hb symptoms.   Sleeping  without nocturnal  or early am exacerbation  of respiratory  c/o's or need for noct saba. Also denies any obvious fluctuation of symptoms with weather or environmental changes or other aggravating or alleviating factors except as outlined above   No unusual exposure hx or h/o childhood pna  or knowledge of premature birth.  Current Allergies, Complete Past Medical History, Past Surgical History, Family History, and Social History were reviewed in [employer] record.  ROS  The following are not active complaints unless bolded Hoarseness, sore throat, dysphagia, dental problems, itching, sneezing,  nasal congestion or discharge of excess mucus or purulent secretions, ear ache,   fever, chills, sweats, unintended wt loss or wt gain, classically pleuritic or exertional cp,  orthopnea pnd or arm/hand swelling  or leg swelling, presyncope, palpitations, abdominal pain, anorexia, nausea, vomiting, diarrhea  or change in bowel habits or change in bladder habits, change in stools or change in urine, dysuria, hematuria,  rash, arthralgias, visual complaints, headache, numbness, weakness or ataxia or problems with walking or coordination,  change in mood or  memory.        Current Meds  Medication  Sig  . acetaminophen (TYLENOL) 650 MG CR tablet Take 1,300 mg by mouth 2 (two) times daily as needed for pain.   [name] ALPRAZolam (XANAX) 0.25 MG tablet TAKE 1 TABLET BY MOUTH AT BEDTIME AS NEEDED FOR ANXIETY OR SLEEP  . amLODipine (NORVASC) 5 MG  tablet TAKE 1/2 TABLET BY MOUTH EVERY DAY. make FOLLOWING UP appt WITH dr. Rayann Heman  . apixaban (ELIQUIS) 5 MG TABS tablet Take 1 tablet (5 mg total) by mouth 2 (two) times daily.  [name] BYSTOLIC 5 MG tablet TAKE 1 TABLET BY MOUTH EVERY DAY  . Calcium Carb-Cholecalciferol (CALCIUM 600+D) 600-800 MG-UNIT TABS Take 1 tablet by mouth daily.  [name] dofetilide (TIKOSYN) 250 MCG capsule TAKE 1 CAPSULE BY MOUTH 2 TIMES DAILY  . estradiol (VIVELLE-DOT) 0.05 MG/24HR patch Apply one patch to the skin twice weekly.  . fluticasone (FLONASE) 50 MCG/ACT nasal spray Use 2 sprays in each nostril two times a day  . fluticasone (FLOVENT HFA) 110 MCG/ACT inhaler INHALE 2 PUFFS INTO THE LUNGS 2 TIMES DAILY  . levalbuterol (XOPENEX HFA) 45 MCG/ACT inhaler inhale 1 TO 2 PUFFS BY MOUTH EVERY 4 HOURS AS NEEDED  . LIVALO 2 MG TABS TAKE 1 TABLET BY MOUTH EVERY DAY  . losartan (COZAAR) 100 MG tablet TAKE 1 TABLET BY MOUTH EVERY DAY  . magic mouthwash SOLN RINSE 5 mls BY MOUTH 3 TO 4 TIMES DAILY AS NEEDED  . magnesium oxide (MAG-OX) 400 MG tablet Take 1 tablet (400 mg total) by mouth daily.  . montelukast (SINGULAIR) 10 MG tablet TAKE 1 TABLET BY MOUTH DAILY  . potassium chloride (KLOR-CON) 10 MEQ tablet TAKE 2 TABLETS BY MOUTH EVERY DAY                    Objective:   Physical Exam   10/22/2019       153 09/16/2018        151 07/01/2017        140  02/19/2017      147   03/25/16 156 lb 12.8 oz (71.1 kg)  02/12/16 155 lb 6.4 oz (70.5 kg)  11/28/15 150 lb 12 oz (68.4 kg)     Vital signs reviewed  10/22/2019  - Note at rest 02 sats  98% on RA     amb pleasant wf nad   HEENT : pt wearing mask not removed for exam due to covid -19 concerns.    NECK :  without JVD/Nodes/TM/ nl  carotid upstrokes bilaterally   LUNGS: no acc muscle use,  Nl contour chest with trace late exp wheeze bilaterally resolves with plm  without cough on insp or exp maneuvers   CV:  RRR  no s3 or murmur or increase in P2, and no edema   ABD:  soft and nontender with nl inspiratory excursion in the supine position. No bruits or organomegaly appreciated, bowel sounds nl  MS:  Nl gait/ ext warm without deformities, calf tenderness, cyanosis or clubbing No obvious joint restrictions   SKIN: warm and dry without lesions    NEURO:  alert, approp, nl sensorium with  no motor or cerebellar deficits apparent.       Assessment & Plan:

## 2019-10-22 NOTE — Patient Instructions (Signed)
No change in medications   Follow up yearly

## 2019-10-23 ENCOUNTER — Other Ambulatory Visit: Payer: Self-pay | Admitting: Internal Medicine

## 2019-10-26 ENCOUNTER — Other Ambulatory Visit: Payer: Self-pay | Admitting: Internal Medicine

## 2019-10-26 DIAGNOSIS — I10 Essential (primary) hypertension: Secondary | ICD-10-CM

## 2019-12-11 DIAGNOSIS — Z23 Encounter for immunization: Secondary | ICD-10-CM | POA: Diagnosis not present

## 2019-12-24 DIAGNOSIS — Z1231 Encounter for screening mammogram for malignant neoplasm of breast: Secondary | ICD-10-CM | POA: Diagnosis not present

## 2019-12-25 ENCOUNTER — Other Ambulatory Visit: Payer: Self-pay | Admitting: Internal Medicine

## 2019-12-25 DIAGNOSIS — E785 Hyperlipidemia, unspecified: Secondary | ICD-10-CM

## 2019-12-27 ENCOUNTER — Ambulatory Visit (HOSPITAL_COMMUNITY)
Admission: RE | Admit: 2019-12-27 | Discharge: 2019-12-27 | Disposition: A | Discharge status: Home / Self Care | Payer: Medicare PPO | Source: Ambulatory Visit | Attending: Nurse Practitioner | Admitting: Nurse Practitioner

## 2019-12-27 ENCOUNTER — Encounter (HOSPITAL_COMMUNITY): Payer: Self-pay | Admitting: Nurse Practitioner

## 2019-12-27 ENCOUNTER — Other Ambulatory Visit: Payer: Self-pay

## 2019-12-27 VITALS — BP 136/74 | HR 71 | Ht 65.0 in | Wt 157.2 lb

## 2019-12-27 DIAGNOSIS — Z880 Allergy status to penicillin: Secondary | ICD-10-CM | POA: Diagnosis not present

## 2019-12-27 DIAGNOSIS — I4892 Unspecified atrial flutter: Secondary | ICD-10-CM | POA: Diagnosis not present

## 2019-12-27 DIAGNOSIS — Z79899 Other long term (current) drug therapy: Secondary | ICD-10-CM | POA: Diagnosis not present

## 2019-12-27 DIAGNOSIS — D6869 Other thrombophilia: Secondary | ICD-10-CM

## 2019-12-27 DIAGNOSIS — Z7901 Long term (current) use of anticoagulants: Secondary | ICD-10-CM | POA: Diagnosis not present

## 2019-12-27 DIAGNOSIS — Z8249 Family history of ischemic heart disease and other diseases of the circulatory system: Secondary | ICD-10-CM | POA: Diagnosis not present

## 2019-12-27 DIAGNOSIS — I10 Essential (primary) hypertension: Secondary | ICD-10-CM | POA: Diagnosis not present

## 2019-12-27 DIAGNOSIS — Z888 Allergy status to other drugs, medicaments and biological substances status: Secondary | ICD-10-CM | POA: Diagnosis not present

## 2019-12-27 DIAGNOSIS — I480 Paroxysmal atrial fibrillation: Secondary | ICD-10-CM | POA: Diagnosis not present

## 2019-12-27 DIAGNOSIS — F17210 Nicotine dependence, cigarettes, uncomplicated: Secondary | ICD-10-CM | POA: Insufficient documentation

## 2019-12-27 LAB — BASIC METABOLIC PANEL
Anion gap: 11 (ref 5–15)
BUN: 11 mg/dL (ref 8–23)
CO2: 22 mmol/L (ref 22–32)
Calcium: 9.4 mg/dL (ref 8.9–10.3)
Chloride: 105 mmol/L (ref 98–111)
Creatinine, Ser: 0.84 mg/dL (ref 0.44–1.00)
GFR, Estimated: >60 mL/min (ref >60)
Glucose, Bld: 82 mg/dL (ref 70–99)
Potassium: 4.8 mmol/L (ref 3.5–5.1)
Sodium: 138 mmol/L (ref 135–145)

## 2019-12-27 LAB — MAGNESIUM: Magnesium: 2.2 mg/dL (ref 1.7–2.4)

## 2019-12-27 MED ORDER — POTASSIUM CHLORIDE ER 10 MEQ PO TBCR: 10.0000 meq | EXTENDED_RELEASE_TABLET | Freq: Every day | ORAL | Status: DC

## 2019-12-27 NOTE — Progress Notes (Signed)
Primary Care Physician: [name], MD Referring Physician: Dr. Amador Cunas is a 74 y.o. female with a h/o  Prior h/o atrial flutter ablation as well atrial flutter ablation, maintaining SR on Tikosyn. She is in the afib clinic for Tikosyn surveillance. She has not noted any afib. She has had issues with BP and recent PCP visit showed elevation of BP. Her PCP instructed  to go up to losartan 100 mg qd from 25 mg daily as well as add Bystolic for elevation of HR in the office that day.. These changes concerned her so she did not start Bystolic and only increased losartan to 50 mg daily. She went to her drugstore and  BP was elevated to 170/80 and pharmacist encouraged her to start Bystolic. Today on both drugs her BP is 139/88. She does drink 2 glass of wine a night. No bleeding issues with eliquis.   F/u in afib clinic, 12/27/19. She is in SR today. She has not noted any afib. BP has been well managed on current meds. Mo change in health. Remains on Tikosyn and eliquis with a CHA2DS2VASc score of 4.   Today, she denies symptoms of palpitations, chest pain, shortness of breath, orthopnea, PND, lower extremity edema, dizziness, presyncope, syncope, or neurologic sequela. The patient is tolerating medications without difficulties and is otherwise without complaint today.   Past Medical History:  Diagnosis Date  . Adenomatous polyp of colon 10/2013   f/u colo 10/2018  . Allergic rhinitis   . ALLERGIC RHINITIS   . ANXIETY    "situational" (02/14/2015)  . Arthritis    "neck, back, hands" (02/14/2015)  . Asthma   . Atrial fibrillation with RVR ([place])   . Atrial flutter ([place])    failed cardioversion 11/2012; s/p ablation 02-02-2013 by Dr Rayann Heman  . Chronic bronchitis ([place])    "I'll have it most years" (02/14/2015)  . Diverticulosis   . Endometriosis   . Essential hypertension 05/30/2014  . Hepatic steatosis   . Oral candidiasis   . Osteopenia 08/2015   T score -1.9 FRAX 16%  / 6.5%  . Pneumothorax on left 1996   left lower lung collapse s/p resection  . Small bowel obstruction ([place])   . Tobacco abuse    Past Surgical History:  Procedure Laterality Date  . ABDOMINAL HYSTERECTOMY    . APPENDECTOMY  1978  . ATRIAL FIBRILLATION ABLATION N/A 11/05/2016   Procedure: Atrial Fibrillation Ablation;  Surgeon: Thompson Grayer, MD;  Location: [facility];  Service: Cardiovascular;  Laterality: N/A;  . ATRIAL FLUTTER ABLATION N/A 02/02/2013   Procedure: ATRIAL FLUTTER ABLATION;  Surgeon: Coralyn Mark, MD;  Location: [facility];  Service: Cardiovascular;  Laterality: N/A;  . CARDIOVERSION N/A 11/27/2012   Procedure: CARDIOVERSION;  Surgeon: Thayer Headings, MD;  Location: [facility];  Service: Cardiovascular;  Laterality: N/A;  . [place]; 1978  . LAPAROSCOPIC PARTIAL COLECTOMY N/A 05/04/2015   Procedure: LAPAROSCOPIC LOW ANTERIOR RESECTION LAPAROSCOPIC LYSIS OF ADHESIONS, SPLENIC FLEXURE MOBILIZATION;  Surgeon: Michael Boston, MD;  Location: [facility];  Service: General;  Laterality: N/A;  . LUNG REMOVAL, PARTIAL Left 02/1965   LLL  . TOTAL ABDOMINAL HYSTERECTOMY W/ BILATERAL SALPINGOOPHORECTOMY  1984   Endometriosis    Current Outpatient Medications  Medication Sig Dispense Refill  . acetaminophen (TYLENOL) 650 MG CR tablet Take 1,300 mg by mouth 2 (two) times daily as needed for pain.     [name] ALPRAZolam (XANAX) 0.25 MG  tablet TAKE 1 TABLET BY MOUTH AT BEDTIME AS NEEDED FOR ANXIETY OR SLEEP 30 tablet 3  . amLODipine (NORVASC) 5 MG tablet TAKE 1/2 TABLET BY MOUTH EVERY DAY. make FOLLOWING UP appt WITH dr. Rayann Heman 45 tablet 3  . apixaban (ELIQUIS) 5 MG TABS tablet Take 1 tablet (5 mg total) by mouth 2 (two) times daily. 180 tablet 1  . BYSTOLIC 5 MG tablet TAKE 1 TABLET BY MOUTH EVERY DAY 90 tablet 0  . Calcium Carb-Cholecalciferol (CALCIUM 600+D) 600-800 MG-UNIT TABS Take 1 tablet by mouth daily.    [name] dofetilide (TIKOSYN) 250 MCG capsule TAKE 1 CAPSULE BY  MOUTH 2 TIMES DAILY 180 capsule 3  . estradiol (VIVELLE-DOT) 0.05 MG/24HR patch Apply one patch to the skin twice weekly. 24 patch 0  . fluticasone (FLONASE) 50 MCG/ACT nasal spray Use 2 sprays in each nostril two times a day 16 g 5  . fluticasone (FLOVENT HFA) 110 MCG/ACT inhaler INHALE 2 PUFFS INTO THE LUNGS 2 TIMES DAILY 12 g 5  . levalbuterol (XOPENEX HFA) 45 MCG/ACT inhaler inhale 1 TO 2 PUFFS BY MOUTH EVERY 4 HOURS AS NEEDED 15 g 3  . LIVALO 2 MG TABS TAKE 1 TABLET BY MOUTH EVERY DAY 90 tablet 0  . losartan (COZAAR) 100 MG tablet TAKE 1 TABLET BY MOUTH EVERY DAY 90 tablet 0  . magic mouthwash SOLN RINSE 5 mls BY MOUTH 3 TO 4 TIMES DAILY AS NEEDED    . magnesium oxide (MAG-OX) 400 MG tablet Take 1 tablet (400 mg total) by mouth daily. 30 tablet 6  . montelukast (SINGULAIR) 10 MG tablet TAKE 1 TABLET BY MOUTH EVERY DAY 90 tablet 3  . potassium chloride (KLOR-CON) 10 MEQ tablet TAKE 2 TABLETS BY MOUTH EVERY DAY 60 tablet 9   No current facility-administered medications for this encounter.    Allergies  Allergen Reactions  . Tramadol Other (See Comments)    Reaction:  Dizziness and weakness   . Amoxicillin-Pot Clavulanate Nausea And Vomiting and Other (See Comments)    Has patient had a PCN reaction causing immediate rash, facial/tongue/throat swelling, SOB or lightheadedness with hypotension: No Has patient had a PCN reaction causing severe rash involving mucus membranes or skin necrosis: No Has patient had a PCN reaction that required hospitalization No Has patient had a PCN reaction occurring within the last 10 years: No If all of the above answers are "NO", then may proceed with Cephalosporin use.  . Flagyl [Metronidazole] Nausea And Vomiting  . Flecainide Other (See Comments)    Dizziness   . Omnicef [Cefdinir] Nausea And Vomiting  . Avelox [Moxifloxacin] Hives and Itching    Social History   Socioeconomic History  . Marital status: Widowed    Spouse name: Not on file  .  Number of children: 2  . Years of education: Not on file  . Highest education level: Not on file  Occupational History  . 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 Smoking status: Current Every Day Smoker    Packs/day: 0.25    Types: Cigarettes  . Smokeless tobacco: Never Used  . Tobacco comment: smokes 4 cigarettes a day 10/22/19 [facility]   Vaping Use  . Vaping Use: Never used  Substance and Sexual Activity  . Alcohol use: Yes    Alcohol/week: 14.0 standard drinks    Types: 14 Standard drinks or equivalent per week    Comment: daily  wine  . Drug use: No  . Sexual activity: Not Currently    Birth control/protection: Surgical, Post-menopausal    Comment: HYST-1st intercourse 74 yo-Fewer than 5 partners  Other Topics Concern  . Not on file  Social History Narrative   Lives with spouse;    2 children      [facility]:   She is from [place] [place]. 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 Previously has attended business school. Previously did office work and Programmer, applications at [employer]. She has also worked for [employer]. She does have a cat currently. She has indoor plants. No mold exposure.   Social Determinants of Health   Financial Resource Strain:   . Difficulty of Paying Living Expenses: Not on file  Food Insecurity:   . Worried About Charity fundraiser in the Last Year: Not on file  . Ran Out of Food in the Last Year: Not on file  Transportation Needs:   . Lack of Transportation (Medical): Not on file  . Lack of Transportation (Non-Medical): Not on file  Physical Activity:   . Days of Exercise per Week: Not on file  . Minutes of Exercise per Session: Not on file  Stress:   . Feeling of Stress : Not on file  Social Connections:   . Frequency of Communication with Friends and Family: Not on file  . Frequency of Social Gatherings with Friends and Family: Not on file  . Attends  Religious Services: Not on file  . Active Member of Clubs or Organizations: Not on file  . Attends Archivist Meetings: Not on file  . Marital Status: Not on file  Intimate Partner Violence:   . Fear of Current or Ex-Partner: Not on file  . Emotionally Abused: Not on file  . Physically Abused: Not on file  . Sexually Abused: Not on file    Family History  Problem Relation Age of Onset  . Asthma Maternal Grandfather   . Stroke Father 30  . Hypertension Father   . Heart disease Father   . Diabetes Mother   . Asthma Child   . Colon cancer Neg Hx   . Rectal cancer Neg Hx   . Stomach cancer Neg Hx     ROS- All systems are reviewed and negative except as per the HPI above  Physical Exam: Vitals:   12/27/19 1325  Weight: 71.3 kg  Height: 5\' 5"  (1.651 m)   Wt Readings from Last 3 Encounters:  12/27/19 71.3 kg  10/22/19 72 kg  07/26/19 71.2 kg    Labs: Lab Results  Component Value Date   NA 139 07/26/2019   K 4.4 07/26/2019   CL 104 07/26/2019   CO2 28 07/26/2019   GLUCOSE 103 (H) 07/26/2019   BUN 13 07/26/2019   CREATININE 0.84 07/26/2019   CALCIUM 10.1 07/26/2019   PHOS 2.8 05/08/2015   MG 2.1 06/24/2019   Lab Results  Component Value Date   INR 1.06 05/09/2016   Lab Results  Component Value Date   CHOL 195 10/14/2018   HDL 75.50 10/14/2018   LDLCALC 79 10/14/2018   TRIG 200.0 (H) 10/14/2018     GEN- The patient is well appearing, alert and oriented x 3 today.   Head- normocephalic, atraumatic Eyes-  Sclera clear, conjunctiva pink Ears- hearing intact Oropharynx- clear Neck- supple, no JVP Lymph- no cervical lymphadenopathy Lungs- Clear to ausculation bilaterally, normal work of breathing Heart- Regular rate  and rhythm, no murmurs, rubs or gallops, PMI not laterally displaced GI- soft, NT, ND, + BS Extremities- no clubbing, cyanosis, or edema MS- no significant deformity or atrophy Skin- no rash or lesion Psych- euthymic mood, full  affect Neuro- strength and sensation are intact  EKG-SR at 64 bpm, Qrs int 96 ms, qtc 449 ms Epic records reviewed    Assessment and Plan: 1. Paroxysmal  afib  Currently quiet Continue dofetilide 250 mcg bid  Continue Bystolic 5 mg daily   Continue eliquis 5 mg bid for CHA2DS2VAS score of at least 4 Alcohol intake recommended is not more than 2 a week   Mag/bmet today   2. HTN Stable   F/u with Dr. Rayann Heman in 6 months   Geroge Baseman. Delona Clasby, [facility] [address] [place], [place] [address] [phone]

## 2019-12-27 NOTE — Addendum Note (Signed)
Encounter addended by: Enid Derry, CMA on: 12/27/2019 3:27 PM  Actions taken: Order list changed

## 2019-12-28 DIAGNOSIS — M25511 Pain in right shoulder: Secondary | ICD-10-CM | POA: Diagnosis not present

## 2020-01-11 DIAGNOSIS — J4520 Mild intermittent asthma, uncomplicated: Secondary | ICD-10-CM | POA: Diagnosis not present

## 2020-01-11 DIAGNOSIS — D126 Benign neoplasm of colon, unspecified: Secondary | ICD-10-CM | POA: Diagnosis not present

## 2020-01-11 DIAGNOSIS — I700 Atherosclerosis of aorta: Secondary | ICD-10-CM | POA: Diagnosis not present

## 2020-01-11 DIAGNOSIS — E0590 Thyrotoxicosis, unspecified without thyrotoxic crisis or storm: Secondary | ICD-10-CM | POA: Diagnosis not present

## 2020-01-11 DIAGNOSIS — M8580 Other specified disorders of bone density and structure, unspecified site: Secondary | ICD-10-CM | POA: Diagnosis not present

## 2020-01-11 DIAGNOSIS — I480 Paroxysmal atrial fibrillation: Secondary | ICD-10-CM | POA: Diagnosis not present

## 2020-01-11 DIAGNOSIS — E785 Hyperlipidemia, unspecified: Secondary | ICD-10-CM | POA: Diagnosis not present

## 2020-01-26 ENCOUNTER — Other Ambulatory Visit (HOSPITAL_COMMUNITY): Payer: Self-pay | Admitting: Internal Medicine

## 2020-01-26 ENCOUNTER — Other Ambulatory Visit: Payer: Self-pay | Admitting: Internal Medicine

## 2020-01-26 DIAGNOSIS — I10 Essential (primary) hypertension: Secondary | ICD-10-CM

## 2020-02-09 ENCOUNTER — Other Ambulatory Visit: Payer: Self-pay | Admitting: Internal Medicine

## 2020-02-09 DIAGNOSIS — F411 Generalized anxiety disorder: Secondary | ICD-10-CM

## 2020-02-18 DIAGNOSIS — M79671 Pain in right foot: Secondary | ICD-10-CM | POA: Diagnosis not present

## 2020-02-18 DIAGNOSIS — M25571 Pain in right ankle and joints of right foot: Secondary | ICD-10-CM | POA: Diagnosis not present

## 2020-02-21 DIAGNOSIS — M25571 Pain in right ankle and joints of right foot: Secondary | ICD-10-CM | POA: Diagnosis not present

## 2020-03-03 ENCOUNTER — Other Ambulatory Visit: Payer: Self-pay | Admitting: Internal Medicine

## 2020-03-08 ENCOUNTER — Other Ambulatory Visit: Payer: Self-pay | Admitting: Internal Medicine

## 2020-03-08 DIAGNOSIS — E785 Hyperlipidemia, unspecified: Secondary | ICD-10-CM

## 2020-03-08 NOTE — Telephone Encounter (Signed)
Prescription refill request for Eliquis received.  Indication: Afib Last office visit:  [place], 06/24/2019 Scr: 0.84, 12/27/2019 Age: 75 yo Weight: 71.3 kg   Prescription refill sent

## 2020-03-10 DIAGNOSIS — M25571 Pain in right ankle and joints of right foot: Secondary | ICD-10-CM | POA: Diagnosis not present

## 2020-03-17 DIAGNOSIS — M25571 Pain in right ankle and joints of right foot: Secondary | ICD-10-CM | POA: Diagnosis not present

## 2020-03-24 DIAGNOSIS — M76821 Posterior tibial tendinitis, right leg: Secondary | ICD-10-CM | POA: Diagnosis not present

## 2020-03-24 DIAGNOSIS — F17200 Nicotine dependence, unspecified, uncomplicated: Secondary | ICD-10-CM | POA: Diagnosis not present

## 2020-03-27 DIAGNOSIS — M25571 Pain in right ankle and joints of right foot: Secondary | ICD-10-CM | POA: Diagnosis not present

## 2020-04-03 ENCOUNTER — Other Ambulatory Visit: Payer: Self-pay | Admitting: Internal Medicine

## 2020-04-03 DIAGNOSIS — M25571 Pain in right ankle and joints of right foot: Secondary | ICD-10-CM | POA: Diagnosis not present

## 2020-04-07 DIAGNOSIS — M25571 Pain in right ankle and joints of right foot: Secondary | ICD-10-CM | POA: Diagnosis not present

## 2020-04-10 DIAGNOSIS — M25571 Pain in right ankle and joints of right foot: Secondary | ICD-10-CM | POA: Diagnosis not present

## 2020-04-14 DIAGNOSIS — M25571 Pain in right ankle and joints of right foot: Secondary | ICD-10-CM | POA: Diagnosis not present

## 2020-04-17 DIAGNOSIS — H25013 Cortical age-related cataract, bilateral: Secondary | ICD-10-CM | POA: Diagnosis not present

## 2020-04-17 DIAGNOSIS — H5213 Myopia, bilateral: Secondary | ICD-10-CM | POA: Diagnosis not present

## 2020-04-17 DIAGNOSIS — H35371 Puckering of macula, right eye: Secondary | ICD-10-CM | POA: Diagnosis not present

## 2020-04-17 DIAGNOSIS — H2513 Age-related nuclear cataract, bilateral: Secondary | ICD-10-CM | POA: Diagnosis not present

## 2020-04-19 DIAGNOSIS — M25571 Pain in right ankle and joints of right foot: Secondary | ICD-10-CM | POA: Diagnosis not present

## 2020-04-21 DIAGNOSIS — M25571 Pain in right ankle and joints of right foot: Secondary | ICD-10-CM | POA: Diagnosis not present

## 2020-04-24 ENCOUNTER — Other Ambulatory Visit: Payer: Self-pay | Admitting: Internal Medicine

## 2020-04-24 DIAGNOSIS — E785 Hyperlipidemia, unspecified: Secondary | ICD-10-CM

## 2020-05-12 DIAGNOSIS — M76821 Posterior tibial tendinitis, right leg: Secondary | ICD-10-CM | POA: Diagnosis not present

## 2020-05-18 ENCOUNTER — Other Ambulatory Visit: Payer: Self-pay | Admitting: Internal Medicine

## 2020-05-29 ENCOUNTER — Telehealth: Payer: Self-pay | Admitting: Internal Medicine

## 2020-05-29 MED ORDER — DOXYCYCLINE HYCLATE 100 MG PO TABS: 100.0000 mg | ORAL_TABLET | Freq: Two times a day (BID) | ORAL | 0 refills | Status: DC

## 2020-05-29 MED ORDER — PREDNISONE 10 MG PO TABS: ORAL_TABLET | ORAL | 0 refills | Status: DC

## 2020-05-29 NOTE — Telephone Encounter (Signed)
Primary Pulmonologist: MW  Last office visit and with whom: 10/22/19 with MW What do we see them for (pulmonary problems): asthma Last OV assessment/plan: Onset all her life Spirometry  09/25/15 FEV1 1.58  (70%)  Ratio 55 on flovent 110 2bid  PFT  09/25/15:  FEV1 1.58 L (70%) FEV1/FVC 0.55   73% DLCO uncorrected 58% - PFT's  07/01/2017  FEV1 1.71 (77 % ) ratio 62  p 6 % improvement from saba p only flovent 110 prior to study with DLCO  60 % corrects to 72  % for alv volume   - 07/01/2017  After extensive coaching inhaler device  effectiveness =    90%   Still doing well on low dose flovent 110 one bid with option of 2 bid dosing for any flare and approp rx with sab prn  So All goals of chronic asthma control met including optimal function and elimination of symptoms with minimal need for rescue therapy.  Contingencies discussed in full including contacting this office immediately if not controlling the symptoms using the rule of two's.      F/u yearly is all she needs for now.          Each maintenance medication was reviewed in detail including emphasizing most importantly the difference between maintenance and prns and under what circumstances the prns are to be triggered using an action plan format where appropriate.  Total time for H and P, chart review, counseling, reviewing  device and generating customized AVS unique to this office visit / charting = 17 min   Was appointment offered to patient (explain)?     Reason for call: Spoke with patient. She stated she developed a head cold this past Saturday. Since then, she has been experiencing increased nasal and chest congestion. She has been sneezing non stop. Her chest feels very tight in the middle. She is not able to breathe at night due to the congestion. She also noticed that she has some chills. She took her temp at home but it stated that she does not have a fever but she admitted that the thermometer was very old and probably  not accurate.   She has been using OTC cold medication from the pharmacy without any relief. She is completely out of her Flonase and is not able to get anymore until after April 1st. She spoke with the pharmacist and per the pharmacist, the way the RX is written, it is only for a 15 day supply, not 30 supply.   She is UTD on her vaccines.   She wanted an appt with MW but I advised that he did not have any openings. She is requesting to have something sent into the pharmacy for her. Pharmacy [facility].   MW, can you please advise? Thanks.   (examples of things to ask: : When did symptoms start? Fever? Cough? Productive? Color to sputum? More sputum than usual? Wheezing? Have you needed increased oxygen? Are you taking your respiratory medications? What over the counter measures have you tried?)  Allergies  Allergen Reactions  . Tramadol Other (See Comments)    Reaction:  Dizziness and weakness   . Amoxicillin-Pot Clavulanate Nausea And Vomiting and Other (See Comments)    Has patient had a PCN reaction causing immediate rash, facial/tongue/throat swelling, SOB or lightheadedness with hypotension: No Has patient had a PCN reaction causing severe rash involving mucus membranes or skin necrosis: No Has patient had a PCN reaction that required hospitalization No Has  patient had a PCN reaction occurring within the last 10 years: No If all of the above answers are "NO", then may proceed with Cephalosporin use.  . Flagyl [Metronidazole] Nausea And Vomiting  . Flecainide Other (See Comments)    Dizziness   . Omnicef [Cefdinir] Nausea And Vomiting  . Avelox [Moxifloxacin] Hives and Itching    Immunization History  Administered Date(s) Administered  . DTaP 04/19/2012  . Influenza Split 12/03/2010  . Influenza Whole 12/02/2008, 12/20/2011  . Influenza, High Dose Seasonal PF 11/18/2015, 12/11/2016, 11/24/2017, 12/07/2018  . Influenza,inj,Quad PF,6+ Mos 11/17/2012  .  Influenza-Unspecified 01/03/2014, 11/11/2014  . PFIZER(Purple Top)SARS-COV-2 Vaccination 03/25/2019, 04/15/2019  . Pneumococcal Conjugate-13 06/13/2014  . Pneumococcal Polysaccharide-23 12/03/2007, 04/19/2012, 10/14/2018  . Tdap 04/07/2012  . Zoster 03/04/1998  . Zoster Recombinat (Shingrix) 12/18/2018

## 2020-05-29 NOTE — Telephone Encounter (Signed)
Called and spoke with patient. She stated that she is about to take a zpak since she has an allergy to PCN. Asked if she remembers any antibiotics that she can take, she stated that she had one a few months ago but can not think of the name. I looked in her chart and it looks like she has been doxycycline and Bactrim in the past.   MW, please advise. Thanks.

## 2020-05-29 NOTE — Telephone Encounter (Signed)
Called and spoke with patient. She is aware of MW's recs. Meds have been called in for her.   Nothing further needed at time of call.

## 2020-05-29 NOTE — Telephone Encounter (Signed)
If no openings for other providers today then rec  zpak Prednisone 10 mg take  4 each am x 2 days,   2 each am x 2 days,  1 each am x 2 days and stop  flonase reordered for 1 month supply and remind it's also avail oct F/u by end of week with at least a televisit if no openings by then

## 2020-05-29 NOTE — Telephone Encounter (Signed)
zpak is not even closely related to pcn but if she's taken a z pak in the past and can't tolerate it we need to list it.    Ok to substitute doxy 100 mg bid x 7 days take with a glass of water before eats

## 2020-05-31 ENCOUNTER — Telehealth: Payer: Self-pay | Admitting: Internal Medicine

## 2020-05-31 NOTE — Telephone Encounter (Signed)
Called and spoke with patient who states that she is not feeling any better after getting medicine MW prescribed. Patient is very tight in chest, and has no energy-shaky from Prednisone which is normal. Almost called EMS last night. Productive cough with clear sputum. Denies fever. Patient requested to have an appointment but was told that Dr. Melvyn Novas has nothing until April. She said she is willing to see APP. Patient has been scheduled with Tammy for tomorrow at [place]. Nothing further needed at this time.

## 2020-06-01 ENCOUNTER — Ambulatory Visit (INDEPENDENT_AMBULATORY_CARE_PROVIDER_SITE_OTHER): Payer: Medicare PPO

## 2020-06-01 ENCOUNTER — Other Ambulatory Visit: Payer: Self-pay

## 2020-06-01 ENCOUNTER — Ambulatory Visit: Payer: Medicare PPO | Admitting: Adult Health

## 2020-06-01 ENCOUNTER — Encounter: Payer: Self-pay | Admitting: Adult Health

## 2020-06-01 VITALS — BP 120/70 | HR 76 | Temp 97.9°F | Ht 64.0 in | Wt 157.8 lb

## 2020-06-01 DIAGNOSIS — F17210 Nicotine dependence, cigarettes, uncomplicated: Secondary | ICD-10-CM

## 2020-06-01 DIAGNOSIS — J4540 Moderate persistent asthma, uncomplicated: Secondary | ICD-10-CM

## 2020-06-01 DIAGNOSIS — R0602 Shortness of breath: Secondary | ICD-10-CM | POA: Diagnosis not present

## 2020-06-01 DIAGNOSIS — J45909 Unspecified asthma, uncomplicated: Secondary | ICD-10-CM | POA: Diagnosis not present

## 2020-06-01 MED ORDER — TRELEGY ELLIPTA 100-62.5-25 MCG/INH IN AEPB
1.0000 | INHALATION_SPRAY | Freq: Every day | RESPIRATORY_TRACT | 0 refills | Status: DC
Start: 2020-06-01 — End: 2020-08-09

## 2020-06-01 MED ORDER — PREDNISONE 20 MG PO TABS: 20.0000 mg | ORAL_TABLET | Freq: Every day | ORAL | 0 refills | Status: DC

## 2020-06-01 NOTE — Assessment & Plan Note (Addendum)
Smoking cessation discussed Patient has a less than 15-year pack history so therefore does not qualify for the low-dose CT screening program.

## 2020-06-01 NOTE — Patient Instructions (Addendum)
Finish Doxcycline Prednisone 20mg  daily for 5 days .  Stop Flovent  Begin Trelegy 1 puff daily, Brush/rinse and gargle after use.  Mucinex DM Twice daily  As needed  Cough/congestion  Xopenex Inhaler As needed  For wheezing .  Work on not smoking  Follow up with Dr. Melvyn Novas  In 6-8 weeks and .As needed   Please contact office for sooner follow up if symptoms do not improve or worsen or seek emergency care

## 2020-06-01 NOTE — Assessment & Plan Note (Signed)
Acute COPD/asthmatic bronchitic exacerbation.  Chest x-ray shows no acute pneumonia or process. Smoking cessation encouraged. We will have her finish current course of antibiotics.  Extend out prednisone for additional 5 days with prednisone at 20 mg daily until course is finished. Add in Mucinex DM for mucociliary clearance.  Plan  Patient Instructions  Finish Doxcycline Prednisone 20mg  daily for 5 days .  Stop Flovent  Begin Trelegy 1 puff daily, Brush/rinse and gargle after use.  Mucinex DM Twice daily  As needed  Cough/congestion  Xopenex Inhaler As needed  For wheezing .  Work on not smoking  Follow up with Dr. Melvyn Novas  In 6-8 weeks and .As needed   Please contact office for sooner follow up if symptoms do not improve or worsen or seek emergency care

## 2020-06-01 NOTE — Progress Notes (Signed)
@Patient  ID: Elizabeth Mcconnell, female    DOB: December 16, 1945, 75 y.o.   MRN: [id number]  Chief Complaint  Patient presents with  . Acute Visit    Referring provider: [name], MD  HPI: 75 year old female active smoker followed for Chronic Asthma/COPD  TEST/EVENTS :  Onset all her life Spirometry  09/25/15 FEV1 1.58  (70%)  Ratio 55 on flovent 110 2bid  PFT  09/25/15:  FEV1 1.58 L (70%) FEV1/FVC 0.55   73% DLCO uncorrected 58% - PFT's  07/01/2017  FEV1 1.71 (77 % ) ratio 62  p 6 % improvement from saba p only flovent   06/01/2020 Acute OV : COPD /Asthma  Patient presents for an acute office visit.  Complains of 1 week of increased cough congestion shortness of breath and wheezing.  Symptoms started after she was outside doing yard work.  Patient was called and doxycycline and a prednisone taper 4 days ago.  States she is only had minimal improvement in symptoms continues to have severe shortness of breath.  Denies any hemoptysis, chest pain, orthopnea, fever.  Patient has been fully vaccinated for COVID-19 including booster.  Has tested for COVID-19 with home test x2 that were both negative  She remains on Flovent twice daily.  She has had increased Xopenex  use over the last week. Appetite is low denies any nausea vomiting or diarrhea.  Says she is taking in good fluid intake.  She is has 3 days of doxycycline left and only 1 day of prednisone. Patient says she is feeling some better.  But continues to have ongoing cough and congestion.  Chest x-ray today shows chronic COPD changes and scarring Patient does continue to smoke.  We discussed smoking cessation.   Allergies  Allergen Reactions  . Tramadol Other (See Comments)    Reaction:  Dizziness and weakness   . Amoxicillin-Pot Clavulanate Nausea And Vomiting and Other (See Comments)    Has patient had a PCN reaction causing immediate rash, facial/tongue/throat swelling, SOB or lightheadedness with hypotension: No Has patient had  a PCN reaction causing severe rash involving mucus membranes or skin necrosis: No Has patient had a PCN reaction that required hospitalization No Has patient had a PCN reaction occurring within the last 10 years: No If all of the above answers are "NO", then may proceed with Cephalosporin use.  . Flagyl [Metronidazole] Nausea And Vomiting  . Flecainide Other (See Comments)    Dizziness   . Omnicef [Cefdinir] Nausea And Vomiting  . Avelox [Moxifloxacin] Hives and Itching    Immunization History  Administered Date(s) Administered  . DTaP 04/19/2012  . Fluad Quad(high Dose 65+) 12/20/2019  . Influenza Split 12/03/2010  . Influenza Whole 12/02/2008, 12/20/2011  . Influenza, High Dose Seasonal PF 11/18/2015, 12/11/2016, 11/24/2017, 12/07/2018  . Influenza,inj,Quad PF,6+ Mos 11/17/2012  . Influenza-Unspecified 01/03/2014, 11/11/2014  . PFIZER(Purple Top)SARS-COV-2 Vaccination 03/25/2019, 04/15/2019, 12/06/2019  . Pneumococcal Conjugate-13 06/13/2014  . Pneumococcal Polysaccharide-23 12/03/2007, 04/19/2012, 10/14/2018  . Tdap 04/07/2012  . Zoster 03/04/1998  . Zoster Recombinat (Shingrix) 12/18/2018    Past Medical History:  Diagnosis Date  . Adenomatous polyp of colon 10/2013   f/u colo 10/2018  . Allergic rhinitis   . ALLERGIC RHINITIS   . ANXIETY    "situational" (02/14/2015)  . Arthritis    "neck, back, hands" (02/14/2015)  . Asthma   . Atrial fibrillation with RVR ([place])   . Atrial flutter ([place])    failed cardioversion 11/2012; s/p ablation 02-02-2013 by Dr Rayann Heman  .  Chronic bronchitis ([place])    "I'll have it most years" (02/14/2015)  . Diverticulosis   . Endometriosis   . Essential hypertension 05/30/2014  . Hepatic steatosis   . Oral candidiasis   . Osteopenia 08/2015   T score -1.9 FRAX 16% / 6.5%  . Pneumothorax on left 1996   left lower lung collapse s/p resection  . Small bowel obstruction ([place])   . Tobacco abuse     Tobacco History: Social History   Tobacco  Use  Smoking Status Current Every Day Smoker  . Packs/day: 0.25  . Types: Cigarettes  Smokeless Tobacco Never Used  Tobacco Comment   trying to quit, 1 pack/wk as of 06/01/20   Ready to quit: No Counseling given: Yes Comment: trying to quit, 1 pack/wk as of 06/01/20   Outpatient Medications Prior to Visit  Medication Sig Dispense Refill  . acetaminophen (TYLENOL) 650 MG CR tablet Take 1,300 mg by mouth 2 (two) times daily as needed for pain.     [name] ALPRAZolam (XANAX) 0.25 MG tablet TAKE 1 TABLET BY MOUTH AT BEDTIME AS NEEDED FOR ANXIETY OR SLEEP 30 tablet 3  . amLODipine (NORVASC) 2.5 MG tablet Take 1 tablet (2.5 mg total) by mouth daily. 90 tablet 1  . BYSTOLIC 5 MG tablet TAKE 1 TABLET BY MOUTH EVERY DAY 90 tablet 0  . Calcium Carb-Cholecalciferol 600-800 MG-UNIT TABS Take 1 tablet by mouth daily.    [name] dofetilide (TIKOSYN) 250 MCG capsule TAKE 1 CAPSULE BY MOUTH 2 TIMES DAILY 180 capsule 1  . doxycycline (VIBRA-TABS) 100 MG tablet Take 1 tablet (100 mg total) by mouth 2 (two) times daily. 14 tablet 0  . ELIQUIS 5 MG TABS tablet TAKE 1 TABLET BY MOUTH 2 TIMES DAILY 180 tablet 1  . fluticasone (FLONASE) 50 MCG/ACT nasal spray USE 2 SPRAYS in EACH NOSTRIL 2 TIMES DAILY -WILL LAST 15 DAYS 16 g 5  . levalbuterol (XOPENEX HFA) 45 MCG/ACT inhaler inhale 1 TO 2 PUFFS BY MOUTH EVERY 4 HOURS AS NEEDED 15 g 3  . LIVALO 2 MG TABS TAKE 1 TABLET BY MOUTH EVERY DAY 90 tablet 0  . losartan (COZAAR) 100 MG tablet TAKE 1 TABLET BY MOUTH EVERY DAY 30 tablet 6  . magnesium oxide (MAG-OX) 400 MG tablet Take 1 tablet (400 mg total) by mouth daily. 30 tablet 6  . montelukast (SINGULAIR) 10 MG tablet TAKE 1 TABLET BY MOUTH EVERY DAY 90 tablet 3  . potassium chloride (KLOR-CON) 10 MEQ tablet Take 1 tablet (10 mEq total) by mouth daily.    . predniSONE (DELTASONE) 10 MG tablet Take 4 tabs x 2 days, 2 tabs x 2 days, then 1 tab x 2 days and stop. 14 tablet 0  . fluticasone (FLOVENT HFA) 110 MCG/ACT inhaler INHALE  2 PUFFS INTO THE LUNGS 2 TIMES DAILY 12 g 5  . estradiol (VIVELLE-DOT) 0.05 MG/24HR patch Apply one patch to the skin twice weekly. (Patient not taking: No sig reported) 24 patch 0  . magic mouthwash SOLN RINSE 5 mls BY MOUTH 3 TO 4 TIMES DAILY AS NEEDED (Patient not taking: Reported on 06/01/2020)     No facility-administered medications prior to visit.     Review of Systems:   Constitutional:   No  weight loss, night sweats,  Fevers, chills, + fatigue, or  lassitude.  HEENT:   No headaches,  Difficulty swallowing,  Tooth/dental problems, or  Sore throat,  No sneezing, itching, ear ache,  +nasal congestion, post nasal drip,   CV:  No chest pain,  Orthopnea, PND, swelling in lower extremities, anasarca, dizziness, palpitations, syncope.   GI  No heartburn, indigestion, abdominal pain, nausea, vomiting, diarrhea, change in bowel habits, loss of appetite, bloody stools.   Resp:  No chest wall deformity  Skin: no rash or lesions.  GU: no dysuria, change in color of urine, no urgency or frequency.  No flank pain, no hematuria   MS:  No joint pain or swelling.  No decreased range of motion.  No back pain.    Physical Exam  BP 120/70 (BP Location: Left Arm, Patient Position: Sitting, Cuff Size: Normal)   Pulse 76   Temp 97.9 F (36.6 C) (Temporal)   Ht 5\' 4"  (1.626 m)   Wt 157 lb 12.8 oz (71.6 kg)   SpO2 95%   BMI 27.09 kg/m   GEN: A/Ox3; pleasant , NAD, well nourished    HEENT:  [place]/AT,   NOSE-clear, THROAT-clear, no lesions, no postnasal drip or exudate noted.   NECK:  Supple w/ fair ROM; no JVD; normal carotid impulses w/o bruits; no thyromegaly or nodules palpated; no lymphadenopathy.    RESP scattered rhonchi bilaterally no accessory muscle use, no dullness to percussion  CARD:  RRR, no m/r/g, no peripheral edema, pulses intact, no cyanosis or clubbing.  GI:   Soft & nt; nml bowel sounds; no organomegaly or masses detected.   Musco: Warm bil, no  deformities or joint swelling noted.   Neuro: alert, no focal deficits noted.    Skin: Warm, no lesions or rashes    Lab Results:    BNP  Imaging: DG Chest 2 View  Result Date: 06/01/2020 CLINICAL DATA:  75 year old female with shortness of breath. Smoker, asthma. EXAM: CHEST - 2 VIEW COMPARISON:  Cardiac CTA 10/30/2016. Chest radiographs 12/02/2016 and earlier. FINDINGS: Stable lung volumes since 2018 with left apical architectural distortion, less severe chronic right apical scarring. Stable cardiac size and mediastinal contours. No pneumothorax, pulmonary edema, pleural effusion or acute pulmonary opacity. No acute osseous abnormality identified. Negative visible bowel gas pattern. IMPRESSION: Chronic lung disease with architectural distortion on the left. No acute cardiopulmonary abnormality. Electronically Signed   By: Genevie Ann M.D.   On: 06/01/2020 09:57      PFT Results Latest Ref Rng & Units 07/01/2017 09/25/2015  FVC-Pre L 2.74 2.85  FVC-Predicted Pre % 93 95  FVC-Post L 2.78 -  FVC-Predicted Post % 95 -  Pre FEV1/FVC % % 57 55  Post FEV1/FCV % % 62 -  FEV1-Pre L 1.56 1.58  FEV1-Predicted Pre % 71 70  FEV1-Post L 1.71 -  DLCO uncorrected ml/min/mmHg 14.99 14.85  DLCO UNC% % 60 59  DLCO corrected ml/min/mmHg - 14.67  DLCO COR %Predicted % - 58  DLVA Predicted % 72 67  TLC L 4.83 4.75  TLC % Predicted % 94 92  RV % Predicted % 89 73    No results found for: NITRICOXIDE      Assessment & Plan:   Asthma, moderate persistent Acute COPD/asthmatic bronchitic exacerbation.  Chest x-ray shows no acute pneumonia or process. Smoking cessation encouraged. We will have her finish current course of antibiotics.  Extend out prednisone for additional 5 days with prednisone at 20 mg daily until course is finished. Add in Mucinex DM for mucociliary clearance.  Plan  Patient Instructions  Finish Doxcycline Prednisone 20mg  daily for 5 days .  Stop  Flovent  Begin Trelegy  1 puff daily, Brush/rinse and gargle after use.  Mucinex DM Twice daily  As needed  Cough/congestion  Xopenex Inhaler As needed  For wheezing .  Work on not smoking  Follow up with Dr. Melvyn Novas  In 6-8 weeks and .As needed   Please contact office for sooner follow up if symptoms do not improve or worsen or seek emergency care        Cigarette smoker Smoking cessation discussed Patient has a less than 15-year pack history so therefore does not qualify for the low-dose CT screening program.     Rexene Edison, NP 06/01/2020

## 2020-06-19 ENCOUNTER — Telehealth: Payer: Self-pay | Admitting: Adult Health

## 2020-06-19 MED ORDER — TRELEGY ELLIPTA 100-62.5-25 MCG/INH IN AEPB: 1.0000 | INHALATION_SPRAY | Freq: Every day | RESPIRATORY_TRACT | 5 refills | Status: DC

## 2020-06-19 NOTE — Telephone Encounter (Signed)
Pt is calling in regards to Nurse Parrett changing her inhaler but stated that it has not been called into her pharmacy and they will not fill it for her without the order. Pt stated that she is wanting clarification. Stated she has nothing left of the sample of Trelegy. Pharmacy; [facility] - [place], [place] - [address] [place] regard [phone].

## 2020-06-19 NOTE — Telephone Encounter (Signed)
Called and spoke with pt and she is aware that the trelegy has already been sent to her pharmacy.  Nothing further is needed.

## 2020-07-03 DIAGNOSIS — Z720 Tobacco use: Secondary | ICD-10-CM | POA: Diagnosis not present

## 2020-07-03 DIAGNOSIS — E0590 Thyrotoxicosis, unspecified without thyrotoxic crisis or storm: Secondary | ICD-10-CM | POA: Diagnosis not present

## 2020-07-03 DIAGNOSIS — I700 Atherosclerosis of aorta: Secondary | ICD-10-CM | POA: Diagnosis not present

## 2020-07-03 DIAGNOSIS — M8580 Other specified disorders of bone density and structure, unspecified site: Secondary | ICD-10-CM | POA: Diagnosis not present

## 2020-07-03 DIAGNOSIS — K760 Fatty (change of) liver, not elsewhere classified: Secondary | ICD-10-CM | POA: Diagnosis not present

## 2020-07-03 DIAGNOSIS — I480 Paroxysmal atrial fibrillation: Secondary | ICD-10-CM | POA: Diagnosis not present

## 2020-07-03 DIAGNOSIS — J4520 Mild intermittent asthma, uncomplicated: Secondary | ICD-10-CM | POA: Diagnosis not present

## 2020-07-03 DIAGNOSIS — E785 Hyperlipidemia, unspecified: Secondary | ICD-10-CM | POA: Diagnosis not present

## 2020-07-03 DIAGNOSIS — D126 Benign neoplasm of colon, unspecified: Secondary | ICD-10-CM | POA: Diagnosis not present

## 2020-07-13 ENCOUNTER — Ambulatory Visit: Payer: Medicare PPO | Admitting: Internal Medicine

## 2020-07-24 ENCOUNTER — Other Ambulatory Visit: Payer: Self-pay

## 2020-07-24 ENCOUNTER — Ambulatory Visit: Payer: Medicare PPO | Admitting: Internal Medicine

## 2020-07-24 ENCOUNTER — Encounter: Payer: Self-pay | Admitting: Internal Medicine

## 2020-07-24 DIAGNOSIS — J4540 Moderate persistent asthma, uncomplicated: Secondary | ICD-10-CM | POA: Diagnosis not present

## 2020-07-24 DIAGNOSIS — F17210 Nicotine dependence, cigarettes, uncomplicated: Secondary | ICD-10-CM

## 2020-07-24 MED ORDER — TRELEGY ELLIPTA 100-62.5-25 MCG/INH IN AEPB: 1.0000 | INHALATION_SPRAY | Freq: Every day | RESPIRATORY_TRACT | 0 refills | Status: DC

## 2020-07-24 NOTE — Patient Instructions (Addendum)
Plan A = Automatic = Always=    Trelegy one click first thing each am  - take two good drags   Work on inhaler technique:  relax and gently blow all the way out then take a nice smooth deep breath back in,    Hold for up to 5 seconds if you can. Blow out thru nose. Rinse and gargle with water when done     Plan B = Backup (to supplement plan A, not to replace it) Only use your albuterol inhaler as a rescue medication to be used if you can't catch your breath by resting or doing a relaxed purse lip breathing pattern.  - The less you use it, the better it will work when you need it. - Ok to use the inhaler up to 2 puffs  every 4 hours if you must but call for appointment if use goes up over your usual need - Don't leave home without it !!  (think of it like the spare tire for your car)   Nasonex can be used up to 2 puffs in each nostril every 12 hours but typically once better ok to adjust down to lowest effective dose.  Please schedule a follow up visit in  6 months but call sooner if needed

## 2020-07-24 NOTE — Assessment & Plan Note (Addendum)
Onset "all her life" Spirometry  09/25/15 FEV1 1.58  (70%)  Ratio 55 on flovent 110 2bid  PFT  09/25/15:  FEV1 1.58 L (70%) FEV1/FVC 0.55   73% DLCO uncorrected 58% - PFT's  07/01/2017  FEV1 1.71 (77 % ) ratio 62  p 6 % improvement from saba p only flovent 110 prior to study with DLCO  60 % corrects to 72  % for alv volume  - 06/01/20  Change to trelegy   - 07/24/2020  After extensive coaching inhaler device,  effectiveness =    90% with dpi > continue trelegy  Despite active smoking, All goals of chronic asthma control met including optimal function and elimination of symptoms with minimal need for rescue therapy.  Contingencies discussed in full including contacting this office immediately if not controlling the symptoms using the rule of two's.

## 2020-07-24 NOTE — Progress Notes (Signed)
Subjective:   Patient ID: Elizabeth Mcconnell, female    DOB: 1945/10/08    MRN: [id number]  Brief patient profile:  52 yowf active smoker with "asthma all her life" previous pt of Dr Joya Gaskins and  Ashok Cordia with moderate asthma criteria vs copd GOLD II as of pfts 07/01/2017       History of Present Illness  03/25/2016 acute extended ov/Elizabeth Mcconnell re:  Chronic asthma maint flovent hfa ? Still smoking ?  Chief Complaint  Patient presents with  . Acute Visit    Pt c/o chest and sinus congestion for the past 2 wks. She feels very tired and states that she has chills "all the time"- no fever. She has had a prod cough with grey sputum.    baseline doing fine on  flovent hfa / singulair not needing any rescue with little flare of cough attributes to  Uri dec 2017 took doxy and all better until 2 weeks prior to OV with cough thick grey mucus x 2 weeks worse in ams with lots of sinus congestion and grey mucus, some chills but no fever / sweats - even with flare not using xopenex and not sure how much she can "maybe 2 pffs a day"  rec Doxycycline 100 mg twice daily x 10 days Prednisone 10 mg take  4 each am x 2 days,   2 each am x 2 days,  1 each am x 2 days and stop  Work on inhaler technique: For cough / congestion mucinex 1200 mg every 12 hours as needed and use the xopenex hfa more regularly to improve the mucus flow     02/19/2017 acute extended ov/Elizabeth Mcconnell re: acute cough  Chief Complaint  Patient presents with  . Acute Visit    Pt c/o cough for the past 3 days. She also c/o tightness in her throat and upper chest. She has had fatigue and chills also.   maint on flovent 2bid and rarely xopenex only a few times a day even when flare for cough or chest tightness assoc with nasal congestion but no sob rec Doxycycline 100 mg twice daily x 10 days Prednisone 10 mg take  4 each am x 2 days,   2 each am x 2 days,  1 each am x 2 days and stop  Work on inhaler technique:   For cough / congestion mucinex  Or  mucinex dm 1200 mg every 12 hours as needed and use the xopenex hfa more regularly to improve the mucus flow  And use Tylenol #3 as needed when can afford to be sleepy Try prilosec otc 20mg   Take 30-60 min before first meal of the day and Pepcid ac (famotidine) 20 mg one @  bedtime until cough is completely gone for at least a week without the need for cough suppression GERD  Diet      04/02/2017  f/u ov/Elizabeth Mcconnell re: moderate asthma/ cough  Chief Complaint  Patient presents with  . Follow-up    Cough had improved, but then worsened again 4 days ago. She feels some tightness in her throat. Cough has been prod with thick, grey sputum.  She is also having some SOB since cough started back.  She has used her xopenex for rescue a few times since she started feeling bad again.   was all better then acutely worse  since 03/30/17 with nasal congestion/ sneezing then cough/ wheeze and sob  Dyspnea:  Not needing xopenex at all at baseline / only twice  daily since acutely ill and not sob at rest p xopenex prn Cough: grey mucus/ worse at hs  Sleep: after xanax does fine/ did not do well with Tyl #3  rec Doxycycline 100 mg twice daily x 10 days Prednisone 10 mg take  4 each am x 2 days,   2 each am x 2 days,  1 each am x 2 days and stop  For cough / congestion mucinex  Or mucinex dm 1200 mg every 12 hours as needed and use the xopenex hfa more regularly to improve the mucus flow   - up to 2 puffs every 4 hours as needed  Try prilosec otc 20mg   Take 30-60 min before first meal of the day and Pepcid ac (famotidine) 20 mg one @  bedtime until cough is completely gone for at least a week without the need for cough suppression    07/01/2017  f/u ov/Elizabeth Mcconnell re  moderate asthma  Lifelong maint on flovent 110 one bid  Chief Complaint  Patient presents with  . Follow-up    PFT's done today. Breathing is doing well and not coughing at this time. She rarely uses her xopenex.   Dyspnea:  MMRC1 = can walk nl pace, flat  grade, can't hurry or go uphills or steps s sob   Cough: gone Sleep: flat   rec The key is to stop smoking completely before smoking completely stops you!     10/22/2019  f/u ov/Elizabeth Mcconnell re: moderate chronic asthma on flovent 110 one bid / smoker  Chief Complaint  Patient presents with  . Follow-up    no complaints    Dyspnea:  Avoids heat /humidity /  no longer walking neighborhood/ ok doing water aerobics Cough: none  Sleeping: bed flat/ one pillow  SABA use: once a week at most  02: none rec No change rx     06/01/20  NP rec trelegy    07/24/2020  f/u ov/Elizabeth Mcconnell re: moderate asthma vs copd  On trelegy hs / still smoking  Chief Complaint  Patient presents with  . Follow-up    Fatigue   Dyspnea:  Very sedentary  Cough: none  Sleeping: bed is flat one pillow  SABA use: rarely  02: none  Covid status:  vax x 4  One twice daily  nasonex seems to help nasal symptoms   No obvious day to day or daytime variability or assoc excess/ purulent sputum or mucus plugs or hemoptysis or cp or chest tightness, subjective wheeze or overt sinus or hb symptoms.   Sleeping as above  without nocturnal  or early am exacerbation  of respiratory  c/o's or need for noct saba. Also denies any obvious fluctuation of symptoms with weather or environmental changes or other aggravating or alleviating factors except as outlined above   No unusual exposure hx or h/o childhood pna  or knowledge of premature birth.  Current Allergies, Complete Past Medical History, Past Surgical History, Family History, and Social History were reviewed in [employer] record.  ROS  The following are not active complaints unless bolded Hoarseness, sore throat, dysphagia, dental problems, itching, sneezing,  nasal congestion or discharge of excess mucus or purulent secretions, ear ache,   fever, chills, sweats, unintended wt loss or wt gain, classically pleuritic or exertional cp,  orthopnea pnd or arm/hand  swelling  or leg swelling, presyncope, palpitations, abdominal pain, anorexia, nausea, vomiting, diarrhea  or change in bowel habits or change in bladder habits, change in stools  or change in urine, dysuria, hematuria,  rash, arthralgias, visual complaints, headache, numbness, weakness or ataxia or problems with walking or coordination,  change in mood or  memory.        Current Meds  Medication Sig  . acetaminophen (TYLENOL) 650 MG CR tablet Take 1,300 mg by mouth 2 (two) times daily as needed for pain.   [name] ALPRAZolam (XANAX) 0.25 MG tablet TAKE 1 TABLET BY MOUTH AT BEDTIME AS NEEDED FOR ANXIETY OR SLEEP  . amLODipine (NORVASC) 2.5 MG tablet Take 1 tablet (2.5 mg total) by mouth daily.  [name] BYSTOLIC 5 MG tablet TAKE 1 TABLET BY MOUTH EVERY DAY  . Calcium Carb-Cholecalciferol 600-800 MG-UNIT TABS Take 1 tablet by mouth daily.  [name] dofetilide (TIKOSYN) 250 MCG capsule TAKE 1 CAPSULE BY MOUTH 2 TIMES DAILY  . ELIQUIS 5 MG TABS tablet TAKE 1 TABLET BY MOUTH 2 TIMES DAILY  . fluticasone (FLONASE) 50 MCG/ACT nasal spray USE 2 SPRAYS in EACH NOSTRIL 2 TIMES DAILY -WILL LAST 15 DAYS  . Fluticasone-Umeclidin-Vilant (TRELEGY ELLIPTA) 100-62.5-25 MCG/INH AEPB Inhale 1 puff into the lungs daily.  . Fluticasone-Umeclidin-Vilant (TRELEGY ELLIPTA) 100-62.5-25 MCG/INH AEPB Inhale 1 puff into the lungs daily.  [name] levalbuterol (XOPENEX HFA) 45 MCG/ACT inhaler inhale 1 TO 2 PUFFS BY MOUTH EVERY 4 HOURS AS NEEDED  . LIVALO 2 MG TABS TAKE 1 TABLET BY MOUTH EVERY DAY  . losartan (COZAAR) 100 MG tablet TAKE 1 TABLET BY MOUTH EVERY DAY  . magnesium oxide (MAG-OX) 400 MG tablet Take 1 tablet (400 mg total) by mouth daily.  . montelukast (SINGULAIR) 10 MG tablet TAKE 1 TABLET BY MOUTH EVERY DAY  . potassium chloride (KLOR-CON) 10 MEQ tablet Take 1 tablet (10 mEq total) by mouth daily.  . predniSONE (DELTASONE) 10 MG tablet Take 4 tabs x 2 days, 2 tabs x 2 days, then 1 tab x 2 days and stop.                Objective:    Physical Exam   07/24/2020         159  10/22/2019       153 09/16/2018        151 07/01/2017        140  02/19/2017      147   03/25/16 156 lb 12.8 oz (71.1 kg)  02/12/16 155 lb 6.4 oz (70.5 kg)  11/28/15 150 lb 12 oz (68.4 kg)    Vital signs reviewed  07/24/2020  - Note at rest 02 sats  98% on RA   General appearance:    amb pleasant wf nad   HEENT : pt wearing mask not removed for exam due to covid -19 concerns.    NECK :  without JVD/Nodes/TM/ nl carotid upstrokes bilaterally   LUNGS: no acc muscle use,  Nl contour chest  Trace end exp rhonchi resolves with plm without cough on insp or exp maneuvers   CV:  RRR  no s3 or murmur or increase in P2, and no edema   ABD:  soft and nontender with nl inspiratory excursion in the supine position. No bruits or organomegaly appreciated, bowel sounds nl  MS:  Nl gait/ ext warm without deformities, calf tenderness, cyanosis or clubbing No obvious joint restrictions   SKIN: warm and dry without lesions    NEURO:  alert, approp, nl sensorium with  no motor or cerebellar deficits apparent.  Assessment & Plan:

## 2020-07-25 ENCOUNTER — Encounter: Payer: Self-pay | Admitting: Internal Medicine

## 2020-07-25 NOTE — Assessment & Plan Note (Signed)
Counseled re importance of smoking cessation but did not meet time criteria for separate billing            Each maintenance medication was reviewed in detail including emphasizing most importantly the difference between maintenance and prns and under what circumstances the prns are to be triggered using an action plan format where appropriate.  Total time for H and P, chart review, counseling, reviewing elipta and nasal device(s) and generating customized AVS unique to this office visit / same day charting = 23 min

## 2020-07-26 ENCOUNTER — Ambulatory Visit: Payer: Medicare PPO | Admitting: Internal Medicine

## 2020-08-09 ENCOUNTER — Ambulatory Visit: Payer: Medicare PPO | Admitting: Internal Medicine

## 2020-08-09 ENCOUNTER — Other Ambulatory Visit: Payer: Self-pay

## 2020-08-09 ENCOUNTER — Encounter: Payer: Self-pay | Admitting: Internal Medicine

## 2020-08-09 VITALS — BP 128/70 | HR 74 | Ht 65.0 in | Wt 161.4 lb

## 2020-08-09 DIAGNOSIS — I10 Essential (primary) hypertension: Secondary | ICD-10-CM

## 2020-08-09 DIAGNOSIS — E039 Hypothyroidism, unspecified: Secondary | ICD-10-CM

## 2020-08-09 DIAGNOSIS — I480 Paroxysmal atrial fibrillation: Secondary | ICD-10-CM | POA: Diagnosis not present

## 2020-08-09 LAB — BASIC METABOLIC PANEL
BUN/Creatinine Ratio: 11 — ABNORMAL LOW (ref 12–28)
BUN: 9 mg/dL (ref 8–27)
CO2: 21 mmol/L (ref 20–29)
Calcium: 9.5 mg/dL (ref 8.7–10.3)
Chloride: 103 mmol/L (ref 96–106)
Creatinine, Ser: 0.85 mg/dL (ref 0.57–1.00)
Glucose: 86 mg/dL (ref 65–99)
Potassium: 4.5 mmol/L (ref 3.5–5.2)
Sodium: 139 mmol/L (ref 134–144)
eGFR: 71 mL/min/{1.73_m2} (ref >59)

## 2020-08-09 LAB — CBC
Hematocrit: 40.7 % (ref 34.0–46.6)
Hemoglobin: 13.5 g/dL (ref 11.1–15.9)
MCH: 29.5 pg (ref 26.6–33.0)
MCHC: 33.2 g/dL (ref 31.5–35.7)
MCV: 89 fL (ref 79–97)
Platelets: 172 10*3/uL (ref 150–450)
RBC: 4.58 x10E6/uL (ref 3.77–5.28)
RDW: 13.4 % (ref 11.7–15.4)
WBC: 7.4 10*3/uL (ref 3.4–10.8)

## 2020-08-09 LAB — MAGNESIUM: Magnesium: 2 mg/dL (ref 1.6–2.3)

## 2020-08-09 NOTE — Patient Instructions (Addendum)
Medication Instructions:  Your physician recommends that you continue on your current medications as directed. Please refer to the Current Medication list given to you today.  Labwork: CBC, BMP, MAG  Testing/Procedures: None ordered.  Follow-Up: Your physician wants you to follow-up in: 6 months in the [facility] Clinic. They will contact you to schedule.   Any Other Special Instructions Will Be Listed Below (If Applicable).  If you need a refill on your cardiac medications before your next appointment, please call your pharmacy.

## 2020-08-09 NOTE — Progress Notes (Signed)
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 female who presents today for routine electrophysiology followup.  Since last being seen in our clinic, the patient reports doing very well.  She recently enjoyed her 2 week timeshare in [facility].  Today, she denies symptoms of palpitations, chest pain, shortness of breath,  lower extremity edema, dizziness, presyncope, or syncope.  The patient is otherwise without complaint today.   Past Medical History:  Diagnosis Date  . Adenomatous polyp of colon 10/2013   f/u colo 10/2018  . Allergic rhinitis   . ALLERGIC RHINITIS   . ANXIETY    "situational" (02/14/2015)  . Arthritis    "neck, back, hands" (02/14/2015)  . Asthma   . Atrial fibrillation with RVR ([place])   . Atrial flutter ([place])    failed cardioversion 11/2012; s/p ablation 02-02-2013 by Dr Rayann Heman  . Chronic bronchitis ([place])    "I'll have it most years" (02/14/2015)  . Diverticulosis   . Endometriosis   . Essential hypertension 05/30/2014  . Hepatic steatosis   . Oral candidiasis   . Osteopenia 08/2015   T score -1.9 FRAX 16% / 6.5%  . Pneumothorax on left 1996   left lower lung collapse s/p resection  . Small bowel obstruction ([place])   . Tobacco abuse    Past Surgical History:  Procedure Laterality Date  . ABDOMINAL HYSTERECTOMY    . APPENDECTOMY  1978  . ATRIAL FIBRILLATION ABLATION N/A 11/05/2016   Procedure: Atrial Fibrillation Ablation;  Surgeon: Thompson Grayer, MD;  Location: [facility];  Service: Cardiovascular;  Laterality: N/A;  . ATRIAL FLUTTER ABLATION N/A 02/02/2013   Procedure: ATRIAL FLUTTER ABLATION;  Surgeon: Coralyn Mark, MD;  Location: [facility] C[facility];  Service: Cardiovascular;  Laterality: N/A;  . CARDIOVERSION N/A 11/27/2012   Procedure: CARDIOVERSION;  Surgeon: Thayer Headings, MD;  Location: [facility];  Service: Cardiovascular;  Laterality: N/A;  . [place]; 1978  . LAPAROSCOPIC PARTIAL COLECTOMY  N/A 05/04/2015   Procedure: LAPAROSCOPIC LOW ANTERIOR RESECTION LAPAROSCOPIC LYSIS OF ADHESIONS, SPLENIC FLEXURE MOBILIZATION;  Surgeon: Michael Boston, MD;  Location: [facility];  Service: General;  Laterality: N/A;  . LUNG REMOVAL, PARTIAL Left 02/1965   LLL  . TOTAL ABDOMINAL HYSTERECTOMY W/ BILATERAL SALPINGOOPHORECTOMY  1984   Endometriosis    ROS- all systems are reviewed and negatives except as per HPI above  Current Outpatient Medications  Medication Sig Dispense Refill  . acetaminophen (TYLENOL) 650 MG CR tablet Take 1,300 mg by mouth 2 (two) times daily as needed for pain.     [name] ALPRAZolam (XANAX) 0.25 MG tablet TAKE 1 TABLET BY MOUTH AT BEDTIME AS NEEDED FOR ANXIETY OR SLEEP 30 tablet 3  . amLODipine (NORVASC) 2.5 MG tablet Take 1 tablet (2.5 mg total) by mouth daily. 90 tablet 1  . BYSTOLIC 5 MG tablet TAKE 1 TABLET BY MOUTH EVERY DAY 90 tablet 0  . Calcium Carb-Cholecalciferol 600-800 MG-UNIT TABS Take 1 tablet by mouth daily.    [name] dofetilide (TIKOSYN) 250 MCG capsule TAKE 1 CAPSULE BY MOUTH 2 TIMES DAILY 180 capsule 1  . ELIQUIS 5 MG TABS tablet TAKE 1 TABLET BY MOUTH 2 TIMES DAILY 180 tablet 1  . fluticasone (FLONASE) 50 MCG/ACT nasal spray USE 2 SPRAYS in EACH NOSTRIL 2 TIMES DAILY -WILL LAST 15 DAYS 16 g 5  . Fluticasone-Umeclidin-Vilant (TRELEGY ELLIPTA) 100-62.5-25 MCG/INH AEPB Inhale 1 puff into the lungs daily.  14 each 0  . levalbuterol (XOPENEX HFA) 45 MCG/ACT inhaler inhale 1 TO 2 PUFFS BY MOUTH EVERY 4 HOURS AS NEEDED 15 g 3  . losartan (COZAAR) 100 MG tablet TAKE 1 TABLET BY MOUTH EVERY DAY 30 tablet 6  . magnesium oxide (MAG-OX) 400 MG tablet Take 1 tablet (400 mg total) by mouth daily. 30 tablet 6  . montelukast (SINGULAIR) 10 MG tablet TAKE 1 TABLET BY MOUTH EVERY DAY 90 tablet 3  . potassium chloride (KLOR-CON) 10 MEQ tablet Take 1 tablet (10 mEq total) by mouth daily.    . pravastatin (PRAVACHOL) 40 MG tablet Take 1 tablet by mouth daily.     No current  facility-administered medications for this visit.    Physical Exam: Vitals:   08/09/20 1222  BP: 128/70  Pulse: 74  SpO2: 96%  Weight: 161 lb 6.4 oz (73.2 kg)  Height: 5\' 5"  (1.651 m)    GEN- The patient is well appearing, alert and oriented x 3 today.   Head- normocephalic, atraumatic Eyes-  Sclera clear, conjunctiva pink Ears- hearing intact Oropharynx- clear Lungs- Clear to ausculation bilaterally, normal work of breathing Heart- Regular rate and rhythm, no murmurs, rubs or gallops, PMI not laterally displaced GI- soft, NT, ND, + BS Extremities- no clubbing, cyanosis, or edema  Wt Readings from Last 3 Encounters:  08/09/20 161 lb 6.4 oz (73.2 kg)  07/24/20 159 lb 3.2 oz (72.2 kg)  06/01/20 157 lb 12.8 oz (71.6 kg)    EKG tracing ordered today is personally reviewed and shows sinus rhythm, incomplete RBBB  Assessment and Plan:  1. Paroxysmal atrial fibrillation Doing well with tikosyn Labs are reviewed chads2vasc score is 4. Continue eliquis  We will follow her closely on tikosyn to avoid toxicity Labs 10/21 reviewed Bmet, mg are ordered today AF clinic note reviewed She has had some fatigue.  Will order cbc.  2. HTN Stable No change required today  3. HL continue statin at current dose  Follow-up in AF clinic in 6 months  Thompson Grayer MD, [facility] 08/09/2020 12:25 PM

## 2020-08-18 ENCOUNTER — Other Ambulatory Visit: Payer: Self-pay | Admitting: Internal Medicine

## 2020-08-18 MED ORDER — BUDESONIDE-FORMOTEROL FUMARATE 160-4.5 MCG/ACT IN AERO: INHALATION_SPRAY | RESPIRATORY_TRACT | 12 refills | Status: DC

## 2020-08-18 NOTE — Progress Notes (Signed)
Pt now doing as well on trelegy for mod chronic asthma and says did better on flovent but using lots of saba so rec symbicort 160 2bid trial

## 2020-08-18 NOTE — Telephone Encounter (Signed)
Please advise on patient mychart message   I'm beginning to worry about this new drug.  It seems to be effective for only 8-9 hours.  Then I begin to have difficulty in breathing.  It has also given me a cough that is not the normal for me.  Ever since I started this medication, I have not had energy, stamina, or interest in doing anything.  My energy is just zapped.  I never had any problems with the Flovent medication.   I'm also concerned about warnings I've heard about taking this medication.  I have heard that if a person has a heart condition and high blood pressure that it could be a problem.  I currently take three blood pressure medications.  Perhaps this is what is causing me problems.   I failed to talk with Dr. Rayann Heman last week about the fact that I am taking this Trelegy.  My bad.  Surely he saw it on my medication list. I was put on this new medication back in April when I had an office visit because I was sick.  I became sick due to my allergies at this time of the year.   I was also put on prednisone and doxycycline.  It took a couple of weeks but I did get over this situation.  However,  the Trelegy is not doing good for me.  That's my opinion.  I'm having to use the albuterol several times a day.  This is out of the ordinary for me. My instructions say that I can use this every 4 hours (2 puffs).  I have not had to do this in years.  But now it is becoming necessary.

## 2020-08-21 ENCOUNTER — Other Ambulatory Visit: Payer: Self-pay | Admitting: Internal Medicine

## 2020-08-21 NOTE — Telephone Encounter (Signed)
Pt last saw Dr Rayann Heman 08/09/20, last labs 08/09/20 Creat 0.85, age 75, weight 73.2kg, based on specified criteria pt is on appropriate dosage of Eliquis 5mg  BID.  Will refill rx.

## 2020-09-25 ENCOUNTER — Other Ambulatory Visit (HOSPITAL_COMMUNITY): Payer: Self-pay | Admitting: Internal Medicine

## 2020-09-25 ENCOUNTER — Other Ambulatory Visit: Payer: Self-pay | Admitting: Internal Medicine

## 2020-10-03 DIAGNOSIS — K760 Fatty (change of) liver, not elsewhere classified: Secondary | ICD-10-CM | POA: Diagnosis not present

## 2020-10-03 DIAGNOSIS — M8580 Other specified disorders of bone density and structure, unspecified site: Secondary | ICD-10-CM | POA: Diagnosis not present

## 2020-10-03 DIAGNOSIS — E785 Hyperlipidemia, unspecified: Secondary | ICD-10-CM | POA: Diagnosis not present

## 2020-10-03 DIAGNOSIS — J4520 Mild intermittent asthma, uncomplicated: Secondary | ICD-10-CM | POA: Diagnosis not present

## 2020-10-03 DIAGNOSIS — E0590 Thyrotoxicosis, unspecified without thyrotoxic crisis or storm: Secondary | ICD-10-CM | POA: Diagnosis not present

## 2020-10-03 DIAGNOSIS — I700 Atherosclerosis of aorta: Secondary | ICD-10-CM | POA: Diagnosis not present

## 2020-10-03 DIAGNOSIS — F419 Anxiety disorder, unspecified: Secondary | ICD-10-CM | POA: Diagnosis not present

## 2020-10-03 DIAGNOSIS — Z720 Tobacco use: Secondary | ICD-10-CM | POA: Diagnosis not present

## 2020-10-03 DIAGNOSIS — I480 Paroxysmal atrial fibrillation: Secondary | ICD-10-CM | POA: Diagnosis not present

## 2020-10-23 ENCOUNTER — Other Ambulatory Visit: Payer: Self-pay | Admitting: Internal Medicine

## 2020-10-30 NOTE — Telephone Encounter (Signed)
Received the following message from patient:   "Thanks so much for suggesting Symbicort as a replacement for the flovent. After taking it for perhaps two months, I beginning to wonder if this is what is causing my nervousness in the am hours.  I'm just not sure.  I have been experiencing a lot of nervousness and just not my usual self.  Not a lot of energy.  Is there any reason for this with the Symbicort?  I just want to get my medications right so that I can feel normal again. Should we try to go back to the flovent?   The Symbicort seems to be doing a good job with controlling my breathing.  I just shake and do not have  a lot of energy.  Any suggestions would be great!!!   Thanks so much, Elizabeth Mcconnell"  MW, can you please advise? Thanks!

## 2020-11-15 ENCOUNTER — Other Ambulatory Visit: Payer: Self-pay | Admitting: Internal Medicine

## 2020-11-28 ENCOUNTER — Other Ambulatory Visit: Payer: Self-pay | Admitting: Internal Medicine

## 2020-12-06 ENCOUNTER — Other Ambulatory Visit (HOSPITAL_COMMUNITY): Payer: Self-pay | Admitting: Internal Medicine

## 2020-12-06 DIAGNOSIS — I10 Essential (primary) hypertension: Secondary | ICD-10-CM

## 2020-12-29 DIAGNOSIS — Z1231 Encounter for screening mammogram for malignant neoplasm of breast: Secondary | ICD-10-CM | POA: Diagnosis not present

## 2021-01-15 DIAGNOSIS — E0590 Thyrotoxicosis, unspecified without thyrotoxic crisis or storm: Secondary | ICD-10-CM | POA: Diagnosis not present

## 2021-01-15 DIAGNOSIS — Z720 Tobacco use: Secondary | ICD-10-CM | POA: Diagnosis not present

## 2021-01-15 DIAGNOSIS — F32A Depression, unspecified: Secondary | ICD-10-CM | POA: Diagnosis not present

## 2021-01-15 DIAGNOSIS — J4520 Mild intermittent asthma, uncomplicated: Secondary | ICD-10-CM | POA: Diagnosis not present

## 2021-01-15 DIAGNOSIS — M8580 Other specified disorders of bone density and structure, unspecified site: Secondary | ICD-10-CM | POA: Diagnosis not present

## 2021-01-15 DIAGNOSIS — I700 Atherosclerosis of aorta: Secondary | ICD-10-CM | POA: Diagnosis not present

## 2021-01-15 DIAGNOSIS — I480 Paroxysmal atrial fibrillation: Secondary | ICD-10-CM | POA: Diagnosis not present

## 2021-01-15 DIAGNOSIS — E785 Hyperlipidemia, unspecified: Secondary | ICD-10-CM | POA: Diagnosis not present

## 2021-01-15 DIAGNOSIS — F419 Anxiety disorder, unspecified: Secondary | ICD-10-CM | POA: Diagnosis not present

## 2021-01-16 ENCOUNTER — Other Ambulatory Visit: Payer: Self-pay

## 2021-01-16 ENCOUNTER — Ambulatory Visit: Payer: Medicare PPO | Admitting: Internal Medicine

## 2021-01-16 ENCOUNTER — Encounter: Payer: Self-pay | Admitting: Internal Medicine

## 2021-01-16 DIAGNOSIS — J4540 Moderate persistent asthma, uncomplicated: Secondary | ICD-10-CM

## 2021-01-16 DIAGNOSIS — F17210 Nicotine dependence, cigarettes, uncomplicated: Secondary | ICD-10-CM | POA: Diagnosis not present

## 2021-01-16 NOTE — Progress Notes (Signed)
Subjective:   Patient ID: Elizabeth Mcconnell, female    DOB: 1946-02-23    MRN: [id number]  Brief patient profile:  45  yowf active smoker with "asthma all her life" previous pt of Dr Joya Gaskins and Ashok Cordia with moderate asthma criteria vs copd GOLD II as of pfts 07/01/2017       History of Present Illness  03/25/2016 acute extended ov/Elizabeth Mcconnell re:  Chronic asthma maint flovent hfa ? Still smoking ?  Chief Complaint  Patient presents with   Acute Visit    Pt c/o chest and sinus congestion for the past 2 wks. She feels very tired and states that she has chills "all the time"- no fever. She has had a prod cough with grey sputum.    baseline doing fine on  flovent hfa / singulair not needing any rescue with little flare of cough attributes to  Uri dec 2017 took doxy and all better until 2 weeks prior to OV with cough thick grey mucus x 2 weeks worse in ams with lots of sinus congestion and grey mucus, some chills but no fever / sweats - even with flare not using xopenex and not sure how much she can "maybe 2 pffs a day"  rec Doxycycline 100 mg twice daily x 10 days Prednisone 10 mg take  4 each am x 2 days,   2 each am x 2 days,  1 each am x 2 days and stop  Work on inhaler technique: For cough / congestion mucinex 1200 mg every 12 hours as needed and use the xopenex hfa more regularly to improve the mucus flow       06/01/20  NP rec trelegy    07/24/2020  f/u ov/Elizabeth Mcconnell re: moderate asthma vs copd  On trelegy hs / still smoking  Chief Complaint  Patient presents with   Follow-up    Fatigue   Dyspnea:  Very sedentary  Cough: none  Sleeping: bed is flat one pillow  SABA use: rarely  02: none  Covid status:  vax x 4  One twice daily  nasonex seems to help nasal symptoms Rec  Plan A = Automatic = Always=    Trelegy one click first thing each am  - take two good drags  Work on inhaler technique Plan B = Backup (to supplement plan A, not to replace it) Only use your albuterol inhaler as a  rescue medication  Nasonex can be used up to 2 puffs in each nostril every 12 hours but typically once better ok to adjust down to lowest effective dose.     01/16/2021  f/u ov/Elizabeth Mcconnell re: mod asthma vs copd  GOLD 2  maint on symbicort one bid   Chief Complaint  Patient presents with   Follow-up    Breathing is overall doing well and no new co's. She rarely uses her xopenex inhaler.    Dyspnea: water aerobics twice weekly  Cough: none  Sleeping: flat bed one pillow / no resp cc  SABA use: very rarely  02: none  Covid status:   vax x ? 5  includes the bivalent    No obvious day to day or daytime variability or assoc excess/ purulent sputum or mucus plugs or hemoptysis or cp or chest tightness, subjective wheeze or overt sinus or hb symptoms.   Sleeping  without nocturnal  or early am exacerbation  of respiratory  c/o's or need for noct saba. Also denies any obvious fluctuation of symptoms with weather or  environmental changes or other aggravating or alleviating factors except as outlined above   No unusual exposure hx or h/o childhood pna/  knowledge of premature birth.  Current Allergies, Complete Past Medical History, Past Surgical History, Family History, and Social History were reviewed in [employer] record.  ROS  The following are not active complaints unless bolded Hoarseness, sore throat, dysphagia, dental problems, itching, sneezing,  nasal congestion or discharge of excess mucus or purulent secretions, ear ache,   fever, chills, sweats, unintended wt loss or wt gain, classically pleuritic or exertional cp,  orthopnea pnd or arm/hand swelling  or leg swelling, presyncope, palpitations, abdominal pain, anorexia, nausea, vomiting, diarrhea  or change in bowel habits or change in bladder habits, change in stools or change in urine, dysuria, hematuria,  rash, arthralgias, visual complaints, headache, numbness, weakness or ataxia or problems with walking or  coordination,  change in mood/ anxious or  memory.        Current Meds  Medication Sig   acetaminophen (TYLENOL) 650 MG CR tablet Take 1,300 mg by mouth 2 (two) times daily as needed for pain.    ALPRAZolam (XANAX) 0.25 MG tablet TAKE 1 TABLET BY MOUTH AT BEDTIME AS NEEDED FOR ANXIETY OR SLEEP   amLODipine (NORVASC) 2.5 MG tablet TAKE 1 TABLET BY MOUTH EVERY DAY   budesonide-formoterol (SYMBICORT) 160-4.5 MCG/ACT inhaler Take 2 puffs first thing in am and then another 2 puffs about 12 hours later.   BYSTOLIC 5 MG tablet TAKE 1 TABLET BY MOUTH EVERY DAY   Calcium Carb-Cholecalciferol 600-800 MG-UNIT TABS Take 1 tablet by mouth daily.   dofetilide (TIKOSYN) 250 MCG capsule TAKE 1 CAPSULE BY MOUTH 2 TIMES DAILY   ELIQUIS 5 MG TABS tablet TAKE 1 TABLET BY MOUTH 2 TIMES DAILY   fluticasone (FLONASE) 50 MCG/ACT nasal spray USE 2 SPRAYS IN EACH NOSTRIL 2 TIMES DAILY -WILL LAST 15 DAYS   levalbuterol (XOPENEX HFA) 45 MCG/ACT inhaler inhale 1 TO 2 PUFFS BY MOUTH EVERY 4 HOURS AS NEEDED   losartan (COZAAR) 100 MG tablet TAKE 1 TABLET BY MOUTH EVERY DAY   magnesium oxide (MAG-OX) 400 MG tablet Take 1 tablet (400 mg total) by mouth daily.   montelukast (SINGULAIR) 10 MG tablet TAKE 1 TABLET BY MOUTH EVERY DAY   potassium chloride (KLOR-CON) 10 MEQ tablet TAKE 2 TABLETS BY MOUTH EVERY DAY   pravastatin (PRAVACHOL) 40 MG tablet Take 1 tablet by mouth daily.                Objective:   Physical Exam  01/16/2021       152   07/24/2020         159  10/22/2019       153 09/16/2018        151 07/01/2017        140  02/19/2017      147   03/25/16 156 lb 12.8 oz (71.1 kg)  02/12/16 155 lb 6.4 oz (70.5 kg)  11/28/15 150 lb 12 oz (68.4 kg)      Vital signs reviewed  01/16/2021  - Note at rest 02 sats  96% on RA   General appearance:    pleasant amb wf   HEENT : pt wearing mask not removed for exam due to covid - 19 concerns.   NECK :  without JVD/Nodes/TM/ nl carotid upstrokes  bilaterally   LUNGS: no acc muscle use,  Min barrel  contour chest wall with  distant  insp/exp rhonchi  and  without cough on insp or exp maneuvers and min  Hyperresonant  to  percussion bilaterally     CV:  RRR  no s3 or murmur or increase in P2, and no edema   ABD:  soft and nontender with pos end  insp Hoover's  in the supine position. No bruits or organomegaly appreciated, bowel sounds nl  MS:   Nl gait/  ext warm without deformities, calf tenderness, cyanosis or clubbing No obvious joint restrictions   SKIN: warm and dry without lesions    NEURO:  alert, approp, nl sensorium with  no motor or cerebellar deficits apparent.                         Assessment & Plan:

## 2021-01-16 NOTE — Assessment & Plan Note (Signed)
4-5 min discussion re active cigarette smoking in addition to office E&M  Ask about tobacco use:   ongoing Advise quitting  I took an extended  opportunity with this patient to outline the consequences of continued cigarette use  in airway disorders based on all the data we have from the multiple national lung health studies (perfomed over decades at millions of dollars in cost)  indicating that smoking cessation, not choice of inhalers or physicians, is the most important aspect of her care.    Assess willingness:  ?  committed at this point Assist in quit attempt: Suggested e-cigs as an optional  "one way bridge"  Off all tobacco products  Arrange follow up:   Follow up per Primary Care planned  For smoking cessation classes call [phone]

## 2021-01-16 NOTE — Patient Instructions (Addendum)
Suggested e-cigs as an optional  "one way bridge"  Off all tobacco products  No change in your medications    Please schedule a follow up visit in 6  months but call sooner if needed

## 2021-01-16 NOTE — Assessment & Plan Note (Addendum)
Onset "all her life" Spirometry  09/25/15 FEV1 1.58  (70%)  Ratio 55 on flovent 110 2bid  PFT  09/25/15:  FEV1 1.58 L (70%) FEV1/FVC 0.55   73% DLCO uncorrected 58% - PFT's  07/01/2017  FEV1 1.71 (77 % ) ratio 62  p 6 % improvement from saba p only flovent 110 prior to study with DLCO  60 % corrects to 72  % for alv volume  - 06/01/20  Change to trelegy   - 07/24/2020  After extensive coaching inhaler device,  effectiveness =    90% with dpi > continue trelegy  Whether we call this ACOS or moderate chronic asthma >  All goals of chronic asthma control met including optimal function and elimination of symptoms with minimal need for rescue therapy.  Contingencies discussed in full including contacting this office immediately if not controlling the symptoms using the rule of two's.     No changes needed.         Each maintenance medication was reviewed in detail including emphasizing most importantly the difference between maintenance and prns and under what circumstances the prns are to be triggered using an action plan format where appropriate.  Total time for H and P, chart review, counseling, reviewing hfa device(s) and generating customized AVS unique to this office visit / same day charting = 25 min

## 2021-01-19 ENCOUNTER — Ambulatory Visit: Payer: Medicare PPO | Admitting: Internal Medicine

## 2021-02-07 ENCOUNTER — Encounter (HOSPITAL_COMMUNITY): Payer: Self-pay | Admitting: Nurse Practitioner

## 2021-02-07 ENCOUNTER — Other Ambulatory Visit: Payer: Self-pay

## 2021-02-07 ENCOUNTER — Other Ambulatory Visit: Payer: Self-pay | Admitting: Internal Medicine

## 2021-02-07 ENCOUNTER — Ambulatory Visit (HOSPITAL_COMMUNITY)
Admission: RE | Admit: 2021-02-07 | Discharge: 2021-02-07 | Disposition: A | Discharge status: Home / Self Care | Payer: Medicare PPO | Source: Ambulatory Visit | Attending: Nurse Practitioner | Admitting: Nurse Practitioner

## 2021-02-07 VITALS — BP 136/66 | HR 76 | Ht 65.0 in | Wt 155.0 lb

## 2021-02-07 DIAGNOSIS — Z7901 Long term (current) use of anticoagulants: Secondary | ICD-10-CM | POA: Diagnosis not present

## 2021-02-07 DIAGNOSIS — I10 Essential (primary) hypertension: Secondary | ICD-10-CM | POA: Diagnosis not present

## 2021-02-07 DIAGNOSIS — I480 Paroxysmal atrial fibrillation: Secondary | ICD-10-CM

## 2021-02-07 DIAGNOSIS — F17210 Nicotine dependence, cigarettes, uncomplicated: Secondary | ICD-10-CM | POA: Diagnosis not present

## 2021-02-07 DIAGNOSIS — D6869 Other thrombophilia: Secondary | ICD-10-CM

## 2021-02-07 DIAGNOSIS — Z79899 Other long term (current) drug therapy: Secondary | ICD-10-CM | POA: Diagnosis not present

## 2021-02-07 LAB — BASIC METABOLIC PANEL
Anion gap: 7 (ref 5–15)
BUN: 13 mg/dL (ref 8–23)
CO2: 25 mmol/L (ref 22–32)
Calcium: 9.3 mg/dL (ref 8.9–10.3)
Chloride: 106 mmol/L (ref 98–111)
Creatinine, Ser: 0.78 mg/dL (ref 0.44–1.00)
GFR, Estimated: >60 mL/min (ref >60)
Glucose, Bld: 120 mg/dL — ABNORMAL HIGH (ref 70–99)
Potassium: 4.2 mmol/L (ref 3.5–5.1)
Sodium: 138 mmol/L (ref 135–145)

## 2021-02-07 LAB — MAGNESIUM: Magnesium: 2.3 mg/dL (ref 1.7–2.4)

## 2021-02-07 NOTE — Progress Notes (Signed)
Primary Care Physician: [name], MD Referring Physician: Dr. Amador Cunas is a 75 y.o. female with a h/o  Prior h/o atrial flutter ablation as well atrial flutter ablation, maintaining SR on Tikosyn. She is in the afib clinic for Tikosyn surveillance. She has not noted any afib. She has had issues with BP and recent PCP visit showed elevation of BP. Her PCP instructed  to go up to losartan 100 mg qd from 25 mg daily as well as add Bystolic for elevation of HR in the office that day.. These changes concerned her so she did not start Bystolic and only increased losartan to 50 mg daily. She went to her drugstore and  BP was elevated to 170/80 and pharmacist encouraged her to start Bystolic. Today on both drugs her BP is 139/88. She does drink 2 glass of wine a night. No bleeding issues with eliquis.   F/u in afib clinic, 12/27/19. She is in SR today. She has not noted any afib. BP has been well managed on current meds. Mo change in health. Remains on Tikosyn and eliquis with a CHA2DS2VASc score of 4.   F/u in afib clinic, 02/07/21.  Ekg shows SR.  She remains on Tikosyn. She feels well. No afib noted.   Today, she denies symptoms of palpitations, chest pain, shortness of breath, orthopnea, PND, lower extremity edema, dizziness, presyncope, syncope, or neurologic sequela. The patient is tolerating medications without difficulties and is otherwise without complaint today.   Past Medical History:  Diagnosis Date   Adenomatous polyp of colon 10/2013   f/u colo 10/2018   Allergic rhinitis    ALLERGIC RHINITIS    ANXIETY    "situational" (02/14/2015)   Arthritis    "neck, back, hands" (02/14/2015)   Asthma    Atrial fibrillation with RVR ([name])    Atrial flutter ([name])    failed cardioversion 11/2012; s/p ablation 02-02-2013 by Dr Rayann Heman   Chronic bronchitis ([name])    "I'll have it most years" (02/14/2015)   Diverticulosis    Endometriosis    Essential hypertension 05/30/2014    Hepatic steatosis    Oral candidiasis    Osteopenia 08/2015   T score -1.9 FRAX 16% / 6.5%   Pneumothorax on left 1996   left lower lung collapse s/p resection   Small bowel obstruction ([name])    Tobacco abuse    Past Surgical History:  Procedure Laterality Date   ABDOMINAL HYSTERECTOMY     APPENDECTOMY  1978   ATRIAL FIBRILLATION ABLATION N/A 11/05/2016   Procedure: Atrial Fibrillation Ablation;  Surgeon: Thompson Grayer, MD;  Location: [facility];  Service: Cardiovascular;  Laterality: N/A;   ATRIAL FLUTTER ABLATION N/A 02/02/2013   Procedure: ATRIAL FLUTTER ABLATION;  Surgeon: Coralyn Mark, MD;  Location: [facility];  Service: Cardiovascular;  Laterality: N/A;   CARDIOVERSION N/A 11/27/2012   Procedure: CARDIOVERSION;  Surgeon: Thayer Headings, MD;  Location: [facility] END[facility];  Service: Cardiovascular;  Laterality: N/A;   [place]; [name] 05/04/2015   Procedure: LAPAROSCOPIC LOW ANTERIOR RESECTION LAPAROSCOPIC LYSIS OF ADHESIONS, SPLENIC FLEXURE MOBILIZATION;  Surgeon: Michael Boston, MD;  Location: [facility];  Service: General;  Laterality: N/A;   LUNG REMOVAL, PARTIAL Left 02/1965   LLL   TOTAL ABDOMINAL HYSTERECTOMY W/ BILATERAL SALPINGOOPHORECTOMY  1984   Endometriosis    Current Outpatient Medications  Medication Sig Dispense Refill   acetaminophen (TYLENOL) 650 MG CR tablet  Take 1,300 mg by mouth 2 (two) times daily as needed for pain.      ALPRAZolam (XANAX) 0.25 MG tablet TAKE 1 TABLET BY MOUTH AT BEDTIME AS NEEDED FOR ANXIETY OR SLEEP 30 tablet 3   amLODipine (NORVASC) 2.5 MG tablet TAKE 1 TABLET BY MOUTH EVERY DAY 90 tablet 3   budesonide-formoterol (SYMBICORT) 160-4.5 MCG/ACT inhaler Take 2 puffs first thing in am and then another 2 puffs about 12 hours later. (Patient taking differently: Take 1 puff first thing in am and then another 1 puff about 12 hours later.) 1 each 12   BYSTOLIC 5 MG tablet TAKE 1 TABLET BY MOUTH EVERY  DAY 90 tablet 0   Calcium Carb-Cholecalciferol 600-800 MG-UNIT TABS Take 1 tablet by mouth daily.     dofetilide (TIKOSYN) 250 MCG capsule TAKE 1 CAPSULE BY MOUTH 2 TIMES DAILY 180 capsule 1   ELIQUIS 5 MG TABS tablet TAKE 1 TABLET BY MOUTH 2 TIMES DAILY 180 tablet 1   fluticasone (FLONASE) 50 MCG/ACT nasal spray USE 2 SPRAYS IN EACH NOSTRIL 2 TIMES DAILY -WILL LAST 15 DAYS 16 g 5   levalbuterol (XOPENEX HFA) 45 MCG/ACT inhaler inhale 1 TO 2 PUFFS BY MOUTH EVERY 4 HOURS AS NEEDED 15 g 2   losartan (COZAAR) 100 MG tablet TAKE 1 TABLET BY MOUTH EVERY DAY 30 tablet 8   magnesium oxide (MAG-OX) 400 MG tablet Take 1 tablet (400 mg total) by mouth daily. 30 tablet 6   montelukast (SINGULAIR) 10 MG tablet TAKE 1 TABLET BY MOUTH EVERY DAY 90 tablet 3   potassium chloride (KLOR-CON) 10 MEQ tablet TAKE 2 TABLETS BY MOUTH EVERY DAY (Patient taking differently: Takes 100meq by mouth daily) 60 tablet 9   pravastatin (PRAVACHOL) 40 MG tablet Take 1 tablet by mouth daily.     No current facility-administered medications for this encounter.    Allergies  Allergen Reactions   Tramadol Other (See Comments)    Reaction:  Dizziness and weakness    Amoxicillin-Pot Clavulanate Nausea And Vomiting and Other (See Comments)    Has patient had a PCN reaction causing immediate rash, facial/tongue/throat swelling, SOB or lightheadedness with hypotension: No Has patient had a PCN reaction causing severe rash involving mucus membranes or skin necrosis: No Has patient had a PCN reaction that required hospitalization No Has patient had a PCN reaction occurring within the last 10 years: No If all of the above answers are "NO", then may proceed with Cephalosporin use.   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    Employer: OTHER    Employer: [employer]  Tobacco Use   Smoking status: Every Day    Packs/day: 0.25    Types: Cigarettes   Smokeless tobacco: Never   Tobacco comments:    trying to quit, 1 pack/wk as of 06/01/20  Vaping Use   Vaping Use: Never used  Substance and Sexual Activity   Alcohol use: Yes    Alcohol/week: 14.0 standard drinks    Types:  14 Standard drinks or equivalent per week    Comment: daily wine   Drug use: No   Sexual activity: Not Currently    Birth control/protection: Surgical, Post-menopausal    Comment: HYST-1st intercourse 75 yo-Fewer than 5 partners  Other Topics Concern   Not on file  Social History Narrative   Lives with spouse;    2 children      [facility]:   She is from [facility]. 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 Previously has attended business school. Previously did office work and Programmer, applications at [employer]. She has also worked for [employer]. She does have a cat currently. She has indoor plants. No mold exposure.   Social Determinants of Health   Financial Resource Strain: Not on file  Food Insecurity: Not on file  Transportation Needs: Not on file  Physical Activity: Not on file  Stress: Not on file  Social Connections: Not on file  Intimate Partner Violence: Not on file    Family History  Problem Relation Age of Onset   Asthma Maternal Grandfather    Stroke Father 3   Hypertension Father    Heart disease Father    Diabetes Mother    Asthma Child    Colon cancer Neg Hx    Rectal cancer Neg Hx    Stomach cancer Neg Hx     ROS- All systems are reviewed and negative except as per the HPI above  Physical Exam: Vitals:   02/07/21 1355  BP: 136/66  Pulse: 76   Weight: 70.3 kg  Height: 5\' 5"  (1.651 m)   Wt Readings from Last 3 Encounters:  02/07/21 70.3 kg  01/16/21 69.2 kg  08/09/20 73.2 kg    Labs: Lab Results  Component Value Date   NA 139 08/09/2020   K 4.5 08/09/2020   CL 103 08/09/2020   CO2 21 08/09/2020   GLUCOSE 86 08/09/2020   BUN 9 08/09/2020   CREATININE 0.85 08/09/2020   CALCIUM 9.5 08/09/2020   PHOS 2.8 05/08/2015   MG 2.0 08/09/2020   Lab Results  Component Value Date   INR 1.06 05/09/2016   Lab Results  Component Value Date   CHOL 195 10/14/2018   HDL 75.50 10/14/2018   LDLCALC 79 10/14/2018   TRIG 200.0 (H) 10/14/2018     GEN- The patient is well appearing, alert and oriented x 3 today.   Head- normocephalic, atraumatic Eyes-  Sclera clear, conjunctiva pink Ears- hearing intact Oropharynx- clear Neck- supple, no JVP Lymph- no cervical lymphadenopathy Lungs- Clear to ausculation bilaterally, normal work of breathing Heart- Regular rate and rhythm, no murmurs, rubs or gallops, PMI not laterally displaced GI- soft, NT, ND, + BS Extremities- no clubbing, cyanosis, or edema MS- no significant deformity or atrophy Skin- no rash or lesion Psych- euthymic mood, full affect Neuro- strength and sensation are intact  EKG-  Vent. rate 76 BPM PR interval 186 ms QRS duration 94 ms QT/QTcB 414/465 ms P-R-T axes 36 -28 37 Sinus rhythm with Premature atrial complexes Incomplete right bundle branch block Inferior infarct , age undetermined Abnormal ECG Epic records reviewed    Assessment and Plan: 1. Paroxysmal  afib  Currently quiet Continue dofetilide 250 mcg bid  Continue Bystolic 5 mg daily   Continue eliquis 5 mg bid for CHA2DS2VAS score of at least 4 Alcohol intake recommended is not more than 2x a week   Mag/bmet today   2. HTN Stable  F/u with  afib clinic in 6 months   Elizabeth Mcconnell, [facility] [address] [place], [place]  [address] [phone]

## 2021-02-07 NOTE — Telephone Encounter (Signed)
Prescription refill request for Eliquis received. Indication: Afib  Last office visit:08/09/20 ([place])  Scr: 0.85 (08/09/20)  Age: 75 Weight: 69.2kg  Appropriate dose and refill sent to requested pharmacy.

## 2021-02-10 ENCOUNTER — Other Ambulatory Visit: Payer: Self-pay | Admitting: Internal Medicine

## 2021-02-23 ENCOUNTER — Telehealth: Payer: Self-pay | Admitting: Internal Medicine

## 2021-02-23 MED ORDER — PREDNISONE 10 MG PO TABS: ORAL_TABLET | ORAL | 0 refills | Status: DC

## 2021-02-23 NOTE — Telephone Encounter (Signed)
I called the patient and she reports that she is short of breath, coughing is mostly non productive. She denies any other symptoms at this time and is not sure if she is having a flare. She is taking her Symbicort but is still feeling like its still not helping as much in the last week with the weather change. She feels the change in weather is making her have more trouble. Please

## 2021-02-23 NOTE — Telephone Encounter (Signed)
I will send in prednisone taper d/t her hx asthma. Continue Symbicort two puffs twice daily. She can take OTC delsym cough syrup twice daily as needed for cough. Notify us if she develops fever, productive cough with purulent mucus or shortness of breath. If symptoms worsen needs ED evaluation over the holiday weekend

## 2021-02-23 NOTE — Telephone Encounter (Signed)
I called and spoke with the pt and notified of response per [facility] She verbalized understanding  Nothing further needed

## 2021-03-07 ENCOUNTER — Other Ambulatory Visit (HOSPITAL_BASED_OUTPATIENT_CLINIC_OR_DEPARTMENT_OTHER): Payer: Self-pay | Admitting: Endocrinology

## 2021-03-07 ENCOUNTER — Other Ambulatory Visit: Payer: Self-pay | Admitting: Endocrinology

## 2021-03-07 DIAGNOSIS — R42 Dizziness and giddiness: Secondary | ICD-10-CM | POA: Diagnosis not present

## 2021-03-07 DIAGNOSIS — R519 Headache, unspecified: Secondary | ICD-10-CM

## 2021-03-07 DIAGNOSIS — R202 Paresthesia of skin: Secondary | ICD-10-CM | POA: Diagnosis not present

## 2021-03-08 ENCOUNTER — Other Ambulatory Visit: Payer: Self-pay

## 2021-03-08 ENCOUNTER — Ambulatory Visit (HOSPITAL_BASED_OUTPATIENT_CLINIC_OR_DEPARTMENT_OTHER)
Admission: RE | Admit: 2021-03-08 | Discharge: 2021-03-08 | Disposition: A | Discharge status: Home / Self Care | Payer: Medicare PPO | Source: Ambulatory Visit | Attending: Endocrinology | Admitting: Endocrinology

## 2021-03-08 DIAGNOSIS — R519 Headache, unspecified: Secondary | ICD-10-CM | POA: Diagnosis not present

## 2021-03-08 DIAGNOSIS — R202 Paresthesia of skin: Secondary | ICD-10-CM | POA: Diagnosis not present

## 2021-03-08 DIAGNOSIS — I6503 Occlusion and stenosis of bilateral vertebral arteries: Secondary | ICD-10-CM | POA: Diagnosis not present

## 2021-03-08 DIAGNOSIS — I6523 Occlusion and stenosis of bilateral carotid arteries: Secondary | ICD-10-CM | POA: Diagnosis not present

## 2021-03-08 MED ORDER — IOHEXOL 350 MG/ML SOLN
75.0000 mL | Freq: Once | INTRAVENOUS | Status: AC | PRN
  Administered 2021-03-08: 75 mL via INTRAVENOUS

## 2021-03-09 ENCOUNTER — Telehealth: Payer: Self-pay | Admitting: Internal Medicine

## 2021-03-09 NOTE — Telephone Encounter (Signed)
Sent mychart message advising Dr. Rayann Heman will continue to be employed with [facility].

## 2021-03-09 NOTE — Telephone Encounter (Signed)
° °  Pt received a letter that Dr. Rayann Heman is leaving, she is requesting if Dr. Rayann Heman can recommend EP doctor for her. Offered to give EP doctors we have but pt said she wants coming from Dr. Rayann Heman

## 2021-03-21 DIAGNOSIS — M5481 Occipital neuralgia: Secondary | ICD-10-CM | POA: Diagnosis not present

## 2021-03-21 DIAGNOSIS — I6501 Occlusion and stenosis of right vertebral artery: Secondary | ICD-10-CM | POA: Diagnosis not present

## 2021-03-22 ENCOUNTER — Other Ambulatory Visit: Payer: Self-pay | Admitting: Neurosurgery

## 2021-03-22 DIAGNOSIS — I6501 Occlusion and stenosis of right vertebral artery: Secondary | ICD-10-CM

## 2021-03-23 ENCOUNTER — Other Ambulatory Visit: Payer: Self-pay | Admitting: Neurosurgery

## 2021-03-29 DIAGNOSIS — M5481 Occipital neuralgia: Secondary | ICD-10-CM | POA: Diagnosis not present

## 2021-04-02 ENCOUNTER — Telehealth: Payer: Self-pay | Admitting: *Deleted

## 2021-04-02 ENCOUNTER — Encounter: Payer: Self-pay | Admitting: Internal Medicine

## 2021-04-02 DIAGNOSIS — I6523 Occlusion and stenosis of bilateral carotid arteries: Secondary | ICD-10-CM

## 2021-04-02 NOTE — Telephone Encounter (Signed)
° °  Pre-operative Risk Assessment    Patient Name: Elizabeth Mcconnell  DOB: 12-24-1945 MRN: [id number]      Request for Surgical Clearance    Procedure:   ARTERIOGRAM  Date of Surgery:  Clearance 04/06/21                                 Surgeon:  DR. Consuella Lose Surgeon's Group or Practice Name:  [facility] Phone number:  [phone] Fax number:  [phone] ATTN: [name]   Type of Clearance Requested:   - Medical  - Pharmacy:  Hold Apixaban (Eliquis)     Type of Anesthesia:   MODERATE SEDATION   Additional requests/questions:    [name]   04/02/2021, 5:32 PM

## 2021-04-02 NOTE — Telephone Encounter (Signed)
Clearance form received and given to [place] at this time. Pt aware of receipt.

## 2021-04-03 DIAGNOSIS — I6523 Occlusion and stenosis of bilateral carotid arteries: Secondary | ICD-10-CM | POA: Insufficient documentation

## 2021-04-03 NOTE — Telephone Encounter (Signed)
Patient with diagnosis of afib on Eliquis for anticoagulation.    Procedure: arteriogram Date of procedure: 04/06/21  CHA2DS2-VASc Score = 5  This indicates a 7.2% annual risk of stroke. The patient's score is based upon: CHF History: 0 HTN History: 1 Diabetes History: 0 Stroke History: 0 Vascular Disease History: 1 Age Score: 2 Gender Score: 1   CrCl 34mL/min Platelet count 172K  Per office protocol, patient can hold Eliquis for 1-2 days prior to procedure.

## 2021-04-03 NOTE — Telephone Encounter (Signed)
° °  Primary Cardiologist: None  Chart reviewed as part of pre-operative protocol coverage. Given past medical history and time since last visit, based on ACC/AHA guidelines, Elizabeth Mcconnell would be at acceptable risk for the planned procedure without further cardiovascular testing.   Patient with diagnosis of afib on Eliquis for anticoagulation.     Procedure: arteriogram Date of procedure: 04/06/21   CHA2DS2-VASc Score = 5  This indicates a 7.2% annual risk of stroke. The patient's score is based upon: CHF History: 0 HTN History: 1 Diabetes History: 0 Stroke History: 0 Vascular Disease History: 1 Age Score: 2 Gender Score: 1   CrCl 3mL/min Platelet count 172K   Per office protocol, patient can hold Eliquis for 1-2 days prior to procedure.  I will route this recommendation to the requesting party via Epic fax function and remove from pre-op pool.  Please call with questions.  [name]. [name] NP-C    04/03/2021, 10:59 AM [name] Office [phone] Fax [phone]

## 2021-04-06 ENCOUNTER — Ambulatory Visit (HOSPITAL_COMMUNITY)
Admission: RE | Admit: 2021-04-06 | Discharge: 2021-04-06 | Disposition: A | Discharge status: Home / Self Care | Payer: Medicare PPO | Source: Ambulatory Visit | Attending: Neurosurgery | Admitting: Neurosurgery

## 2021-04-06 ENCOUNTER — Other Ambulatory Visit: Payer: Self-pay

## 2021-04-06 ENCOUNTER — Encounter (HOSPITAL_COMMUNITY): Payer: Self-pay

## 2021-04-06 DIAGNOSIS — I6501 Occlusion and stenosis of right vertebral artery: Secondary | ICD-10-CM

## 2021-04-06 NOTE — Progress Notes (Addendum)
Appointment cancelled per Dr Kathyrn Sheriff Pt was not gotten ready, no iv no labs

## 2021-04-23 ENCOUNTER — Other Ambulatory Visit: Payer: Self-pay | Admitting: Internal Medicine

## 2021-04-24 NOTE — Telephone Encounter (Signed)
This is a A-Fib clinic pt 

## 2021-04-26 DIAGNOSIS — M5481 Occipital neuralgia: Secondary | ICD-10-CM | POA: Diagnosis not present

## 2021-04-26 DIAGNOSIS — I10 Essential (primary) hypertension: Secondary | ICD-10-CM | POA: Diagnosis not present

## 2021-04-26 DIAGNOSIS — Z6825 Body mass index (BMI) 25.0-25.9, adult: Secondary | ICD-10-CM | POA: Diagnosis not present

## 2021-05-01 DIAGNOSIS — M9901 Segmental and somatic dysfunction of cervical region: Secondary | ICD-10-CM | POA: Diagnosis not present

## 2021-05-02 DIAGNOSIS — H2513 Age-related nuclear cataract, bilateral: Secondary | ICD-10-CM | POA: Diagnosis not present

## 2021-05-02 DIAGNOSIS — H5213 Myopia, bilateral: Secondary | ICD-10-CM | POA: Diagnosis not present

## 2021-05-02 DIAGNOSIS — H25013 Cortical age-related cataract, bilateral: Secondary | ICD-10-CM | POA: Diagnosis not present

## 2021-05-03 ENCOUNTER — Other Ambulatory Visit (HOSPITAL_COMMUNITY): Payer: Self-pay | Admitting: Internal Medicine

## 2021-05-03 DIAGNOSIS — I10 Essential (primary) hypertension: Secondary | ICD-10-CM

## 2021-05-03 DIAGNOSIS — M9901 Segmental and somatic dysfunction of cervical region: Secondary | ICD-10-CM | POA: Diagnosis not present

## 2021-05-03 NOTE — Telephone Encounter (Signed)
This is a A-Fib clinic pt 

## 2021-05-03 NOTE — Telephone Encounter (Signed)
Pt is being seen in the AFib clinic for Dr. Rayann Heman, please address ?

## 2021-05-07 DIAGNOSIS — M9901 Segmental and somatic dysfunction of cervical region: Secondary | ICD-10-CM | POA: Diagnosis not present

## 2021-05-09 DIAGNOSIS — M9901 Segmental and somatic dysfunction of cervical region: Secondary | ICD-10-CM | POA: Diagnosis not present

## 2021-05-10 ENCOUNTER — Other Ambulatory Visit: Payer: Self-pay | Admitting: Internal Medicine

## 2021-05-10 ENCOUNTER — Encounter: Payer: Self-pay | Admitting: Neurology

## 2021-05-11 DIAGNOSIS — M9901 Segmental and somatic dysfunction of cervical region: Secondary | ICD-10-CM | POA: Diagnosis not present

## 2021-05-15 DIAGNOSIS — M9901 Segmental and somatic dysfunction of cervical region: Secondary | ICD-10-CM | POA: Diagnosis not present

## 2021-05-21 DIAGNOSIS — M9901 Segmental and somatic dysfunction of cervical region: Secondary | ICD-10-CM | POA: Diagnosis not present

## 2021-05-23 DIAGNOSIS — M9901 Segmental and somatic dysfunction of cervical region: Secondary | ICD-10-CM | POA: Diagnosis not present

## 2021-05-25 DIAGNOSIS — E0590 Thyrotoxicosis, unspecified without thyrotoxic crisis or storm: Secondary | ICD-10-CM | POA: Diagnosis not present

## 2021-05-25 DIAGNOSIS — R350 Frequency of micturition: Secondary | ICD-10-CM | POA: Diagnosis not present

## 2021-05-25 DIAGNOSIS — R202 Paresthesia of skin: Secondary | ICD-10-CM | POA: Diagnosis not present

## 2021-05-25 DIAGNOSIS — R82998 Other abnormal findings in urine: Secondary | ICD-10-CM | POA: Diagnosis not present

## 2021-05-25 DIAGNOSIS — G4700 Insomnia, unspecified: Secondary | ICD-10-CM | POA: Diagnosis not present

## 2021-05-25 DIAGNOSIS — F419 Anxiety disorder, unspecified: Secondary | ICD-10-CM | POA: Diagnosis not present

## 2021-05-25 DIAGNOSIS — R519 Headache, unspecified: Secondary | ICD-10-CM | POA: Diagnosis not present

## 2021-05-25 DIAGNOSIS — R5383 Other fatigue: Secondary | ICD-10-CM | POA: Diagnosis not present

## 2021-05-25 DIAGNOSIS — N390 Urinary tract infection, site not specified: Secondary | ICD-10-CM | POA: Diagnosis not present

## 2021-05-25 DIAGNOSIS — R42 Dizziness and giddiness: Secondary | ICD-10-CM | POA: Diagnosis not present

## 2021-05-25 DIAGNOSIS — M9901 Segmental and somatic dysfunction of cervical region: Secondary | ICD-10-CM | POA: Diagnosis not present

## 2021-05-30 DIAGNOSIS — M9901 Segmental and somatic dysfunction of cervical region: Secondary | ICD-10-CM | POA: Diagnosis not present

## 2021-06-04 ENCOUNTER — Other Ambulatory Visit: Payer: Self-pay | Admitting: Internal Medicine

## 2021-06-18 ENCOUNTER — Ambulatory Visit: Payer: Medicare PPO | Admitting: Psychiatry

## 2021-06-18 NOTE — Progress Notes (Signed)
? ?NEUROLOGY CONSULTATION NOTE ? ?Elizabeth Mcconnell ?MRN: [id number] ?DOB: 22-Jun-1945 ? ?Referring provider: [name], MD ?Primary care provider: [name], MD ? ?Reason for consult:  head tingling ? ?Assessment/Plan:  ? ?Right sided occipital neuralgia - not really painful but the sensation is causing a great deal of anxiety.  Treatment options would be pharmacologic (gabapentin, duloxetine, etc), repeat occipital nerve block or ablation.  Due to evidence of compressive vertebral arteries, would not recommend physical therapy for the neck.   ?Generalized anxiety disorder, aggravated by #1 ?Compressive bilateral vertebral artery stenosis, asymptomatic ?Hypertension - follow up with PCP ? ?1  For management of both neuralgia and anxiety, will start duloxetine '30mg'$  daily for 2 weeks, then increase to '60mg'$  daily.   ?2  Follow up 4 months. ? ? ?Subjective:  ?Elizabeth Mcconnell is a 76 year old female with a fib, arthritis of spine and hands, HTN, and anxiety who presents for head/neck tingling.  History supplemented by referring provider's note. ? ?Started on New Year's Eve.  She was watching a show at [employer].  When she turned her head to the left to look at the performers, she started experiencing pressure and tingling on the right sided of neck and head.  When she left, she felt dizzy.  No longer dizzy.  It is not as severe now, but If she turns her head to the left, she feels like a "wave of fingers" moving up the right side of her neck and right side of head.  No associated pain but it causes a great deal of anxiety.  She has significant anxiety and this has been aggravating it.  Occasional associated soreness on top of head.  CTA head and neck on 03/08/2021 personally reviewed showed severe right eccentric degenerative changes at C1-2 and moderate right vertebral artery stenosis at C1-2 level and moderate left vertebral artery stenosis at C2-3 level in part due to mass effect from the adjacent  degenerative osteophytes.  Also showed severe multilevel degenerative changes with severe DDD at C5-6 and C6-7.  No pain and weakness in the right arm.  If she rests her right arm over a pillow, she may feel numbness for 30 seconds.  She saw neurosurgery.  She received who was going to do a cerebral angiogram but did not believe it was vascular.  She received an what I think was an occipital nerve block which was helpful for a day.  An ablation was recommended but she declined due to possibility of chronic numbness.  Tried sertraline for 3 days for anxiety but stopped because she felt more nervous and shaky.   ?  ? ? ?PAST MEDICAL HISTORY: ?Past Medical History:  ?Diagnosis Date  ? Adenomatous polyp of colon 10/2013  ? f/u colo 10/2018  ? Allergic rhinitis   ? ALLERGIC RHINITIS   ? ANXIETY   ? "situational" (02/14/2015)  ? Arthritis   ? "neck, back, hands" (02/14/2015)  ? Asthma   ? Atrial fibrillation with RVR ([place])   ? Atrial flutter ([place])   ? failed cardioversion 11/2012; s/p ablation 02-02-2013 by Dr Rayann Heman  ? Chronic bronchitis ([place])   ? "I'll have it most years" (02/14/2015)  ? Diverticulosis   ? Endometriosis   ? Essential hypertension 05/30/2014  ? Hepatic steatosis   ? Oral candidiasis   ? Osteopenia 08/2015  ? T score -1.9 FRAX 16% / 6.5%  ? Pneumothorax on left 1996  ? left lower lung collapse s/p resection  ? Small bowel obstruction (  [place])   ? Tobacco abuse   ? ? ?PAST SURGICAL HISTORY: ?Past Surgical History:  ?Procedure Laterality Date  ? ABDOMINAL HYSTERECTOMY    ? APPENDECTOMY  1978  ? ATRIAL FIBRILLATION ABLATION N/A 11/05/2016  ? Procedure: Atrial Fibrillation Ablation;  Surgeon: Thompson Grayer, MD;  Location: [facility];  Service: Cardiovascular;  Laterality: N/A;  ? ATRIAL FLUTTER ABLATION N/A 02/02/2013  ? Procedure: ATRIAL FLUTTER ABLATION;  Surgeon: Coralyn Mark, MD;  Location: [facility];  Service: Cardiovascular;  Laterality: N/A;  ? CARDIOVERSION N/A 11/27/2012  ? Procedure: CARDIOVERSION;   Surgeon: Thayer Headings, MD;  Location: [facility] ENDOSCOPY;  Service: Cardiovascular;  Laterality: N/A;  ? [place]; 1978  ? LAPAROSCOPIC PARTIAL COLECTOMY N/A 05/04/2015  ? Procedure: LAPAROSCOPIC LOW ANTERIOR RESECTION LAPAROSCOPIC LYSIS OF ADHESIONS, SPLENIC FLEXURE MOBILIZATION;  Surgeon: Michael Boston, MD;  Location: [facility] ORS;  Service: General;  Laterality: N/A;  ? LUNG REMOVAL, PARTIAL Left 02/1965  ? LLL  ? TOTAL ABDOMINAL HYSTERECTOMY W/ BILATERAL SALPINGOOPHORECTOMY  1984  ? Endometriosis  ? ? ?MEDICATIONS: ?Current Outpatient Medications on File Prior to Visit  ?Medication Sig Dispense Refill  ? dofetilide (TIKOSYN) 250 MCG capsule TAKE 1 CAPSULE BY MOUTH 2 TIMES DAILY 180 capsule 1  ? acetaminophen (TYLENOL) 650 MG CR tablet Take 1,300 mg by mouth 2 (two) times daily as needed for pain.     ? ALPRAZolam (XANAX) 0.25 MG tablet TAKE 1 TABLET BY MOUTH AT BEDTIME AS NEEDED FOR ANXIETY OR SLEEP 30 tablet 3  ? amLODipine (NORVASC) 2.5 MG tablet TAKE 1 TABLET BY MOUTH EVERY DAY 90 tablet 3  ? budesonide-formoterol (SYMBICORT) 160-4.5 MCG/ACT inhaler Take 2 puffs first thing in am and then another 2 puffs about 12 hours later. (Patient taking differently: Take 1 puff first thing in am and then another 1 puff about 12 hours later.) 1 each 12  ? BYSTOLIC 5 MG tablet TAKE 1 TABLET BY MOUTH EVERY DAY 90 tablet 0  ? Calcium Carb-Cholecalciferol 600-800 MG-UNIT TABS Take 1 tablet by mouth daily.    ? ELIQUIS 5 MG TABS tablet TAKE 1 TABLET BY MOUTH 2 TIMES DAILY 180 tablet 1  ? fluticasone (FLONASE) 50 MCG/ACT nasal spray USE 2 SPRAYS IN EACH NOSTRIL 2 TIMES DAILY -will last 15 DAYS 16 g 5  ? levalbuterol (XOPENEX HFA) 45 MCG/ACT inhaler inhale 1-2 PUFFS BY MOUTH EVERY 4 HOURS AS NEEDED 15 g 2  ? losartan (COZAAR) 100 MG tablet TAKE 1 TABLET BY MOUTH EVERY DAY 30 tablet 8  ? magnesium oxide (MAG-OX) 400 MG tablet Take 1 tablet (400 mg total) by mouth daily. 30 tablet 6  ? montelukast (SINGULAIR) 10 MG tablet TAKE 1  TABLET BY MOUTH EVERY DAY 90 tablet 3  ? potassium chloride (KLOR-CON) 10 MEQ tablet TAKE 2 TABLETS BY MOUTH EVERY DAY (Patient taking differently: Takes 73mq by mouth daily) 60 tablet 9  ? pravastatin (PRAVACHOL) 40 MG tablet Take 1 tablet by mouth daily.    ? predniSONE (DELTASONE) 10 MG tablet 4 tabs for 2 days, then 3 tabs for 2 days, 2 tabs for 2 days, then 1 tab for 2 days, then stop 20 tablet 0  ? ?No current facility-administered medications on file prior to visit.  ? ? ?ALLERGIES: ?Allergies  ?Allergen Reactions  ? Tramadol Other (See Comments)  ?  Reaction:  Dizziness and weakness   ? Amoxicillin-Pot Clavulanate Nausea And Vomiting and Other (See Comments)  ?  Has  patient had a PCN reaction causing immediate rash, facial/tongue/throat swelling, SOB or lightheadedness with hypotension: No ?Has patient had a PCN reaction causing severe rash involving mucus membranes or skin necrosis: No ?Has patient had a PCN reaction that required hospitalization No ?Has patient had a PCN reaction occurring within the last 10 years: No ?If all of the above answers are "NO", then may proceed with Cephalosporin use.  ? Amoxicillin Other (See Comments)  ? Flagyl [Metronidazole] Nausea And Vomiting  ? Flecainide Other (See Comments)  ?  Dizziness   ? Omnicef [Cefdinir] Nausea And Vomiting  ? Avelox [Moxifloxacin] Hives and Itching  ? ? ?FAMILY HISTORY: ?Family History  ?Problem Relation Age of Onset  ? Asthma Maternal Grandfather   ? Stroke Father 19  ? Hypertension Father   ? Heart disease Father   ? Diabetes Mother   ? Asthma Child   ? Colon cancer Neg Hx   ? Rectal cancer Neg Hx   ? Stomach cancer Neg Hx   ? ? ?Objective:  ?Blood pressure (!) 159/75, pulse 77, height '5\' 5"'$  (1.651 m), weight 152 lb (68.9 kg), SpO2 97 %. ?General: No acute distress.  Patient appears well-groomed.   ?Head:  Normocephalic/atraumatic ?Eyes:  fundi examined but not visualized ?Neck: supple, no paraspinal tenderness, full range of motion ?Back: No  paraspinal tenderness ?Heart: regular rate and rhythm ?Lungs: Clear to auscultation bilaterally. ?Vascular: No carotid bruits. ?Neurological Exam: ?Mental status: alert and oriented to person, place, and t

## 2021-06-19 ENCOUNTER — Encounter: Payer: Self-pay | Admitting: Neurology

## 2021-06-19 ENCOUNTER — Ambulatory Visit: Payer: Medicare PPO | Admitting: Neurology

## 2021-06-19 VITALS — BP 159/75 | HR 77 | Ht 65.0 in | Wt 152.0 lb

## 2021-06-19 DIAGNOSIS — I6503 Occlusion and stenosis of bilateral vertebral arteries: Secondary | ICD-10-CM

## 2021-06-19 DIAGNOSIS — F411 Generalized anxiety disorder: Secondary | ICD-10-CM

## 2021-06-19 DIAGNOSIS — I10 Essential (primary) hypertension: Secondary | ICD-10-CM

## 2021-06-19 DIAGNOSIS — M5481 Occipital neuralgia: Secondary | ICD-10-CM | POA: Diagnosis not present

## 2021-06-19 MED ORDER — DULOXETINE HCL 30 MG PO CPEP: ORAL_CAPSULE | ORAL | 5 refills | Status: DC

## 2021-06-19 NOTE — Patient Instructions (Signed)
Start duloxetine '30mg'$  - take 1 pill daily for 14 days, then increase to 2 pills daily - this will treat both anxiety and the discomfort in back of your head ? ?Follow up 4 months. ?

## 2021-06-20 ENCOUNTER — Encounter: Payer: Self-pay | Admitting: Neurology

## 2021-06-25 ENCOUNTER — Telehealth: Payer: Self-pay | Admitting: Neurology

## 2021-06-25 MED ORDER — GABAPENTIN 100 MG PO CAPS: 100.0000 mg | ORAL_CAPSULE | Freq: Every day | ORAL | 0 refills | Status: DC

## 2021-06-25 NOTE — Telephone Encounter (Signed)
Telephone call to patient, ?Advised of Dr.Jaffe note ? ? ?We can start gabapentin '100mg'$  at bedtime.  If no improvement in 2 weeks and if tolerating it, we can increase dose. ? ? ?Patient agreered, Gabapentin 100 mg QHS called into the pharmacy.  ?

## 2021-06-25 NOTE — Telephone Encounter (Signed)
Patient was put on cymbalta. New meds not working.  Cant eat, nauseated, shaking all over, cant function due to being nervous. Said she feels awful. Spoke with sheena and was told to send message. Will send to jaffe ?

## 2021-06-28 ENCOUNTER — Other Ambulatory Visit: Payer: Self-pay | Admitting: Neurology

## 2021-06-30 ENCOUNTER — Emergency Department (HOSPITAL_COMMUNITY)
Admission: EM | Admit: 2021-06-30 | Discharge: 2021-06-30 | Disposition: A | Discharge status: Home / Self Care | Payer: Medicare PPO | Attending: Emergency Medicine | Admitting: Emergency Medicine

## 2021-06-30 ENCOUNTER — Emergency Department (HOSPITAL_COMMUNITY): Payer: Medicare PPO

## 2021-06-30 ENCOUNTER — Other Ambulatory Visit: Payer: Self-pay

## 2021-06-30 DIAGNOSIS — R509 Fever, unspecified: Secondary | ICD-10-CM | POA: Diagnosis not present

## 2021-06-30 DIAGNOSIS — R519 Headache, unspecified: Secondary | ICD-10-CM | POA: Diagnosis not present

## 2021-06-30 DIAGNOSIS — Z79899 Other long term (current) drug therapy: Secondary | ICD-10-CM | POA: Diagnosis not present

## 2021-06-30 DIAGNOSIS — Z7901 Long term (current) use of anticoagulants: Secondary | ICD-10-CM | POA: Diagnosis not present

## 2021-06-30 DIAGNOSIS — D72829 Elevated white blood cell count, unspecified: Secondary | ICD-10-CM | POA: Diagnosis not present

## 2021-06-30 DIAGNOSIS — R457 State of emotional shock and stress, unspecified: Secondary | ICD-10-CM | POA: Diagnosis not present

## 2021-06-30 DIAGNOSIS — G4489 Other headache syndrome: Secondary | ICD-10-CM | POA: Diagnosis not present

## 2021-06-30 DIAGNOSIS — Z20822 Contact with and (suspected) exposure to covid-19: Secondary | ICD-10-CM | POA: Diagnosis not present

## 2021-06-30 DIAGNOSIS — R0689 Other abnormalities of breathing: Secondary | ICD-10-CM | POA: Diagnosis not present

## 2021-06-30 DIAGNOSIS — I959 Hypotension, unspecified: Secondary | ICD-10-CM | POA: Diagnosis not present

## 2021-06-30 DIAGNOSIS — I10 Essential (primary) hypertension: Secondary | ICD-10-CM | POA: Insufficient documentation

## 2021-06-30 DIAGNOSIS — R0902 Hypoxemia: Secondary | ICD-10-CM | POA: Diagnosis not present

## 2021-06-30 LAB — BASIC METABOLIC PANEL
Anion gap: 12 (ref 5–15)
BUN: 12 mg/dL (ref 8–23)
CO2: 20 mmol/L — ABNORMAL LOW (ref 22–32)
Calcium: 9.4 mg/dL (ref 8.9–10.3)
Chloride: 103 mmol/L (ref 98–111)
Creatinine, Ser: 1.08 mg/dL — ABNORMAL HIGH (ref 0.44–1.00)
GFR, Estimated: 53 mL/min — ABNORMAL LOW (ref >60)
Glucose, Bld: 95 mg/dL (ref 70–99)
Potassium: 4.5 mmol/L (ref 3.5–5.1)
Sodium: 135 mmol/L (ref 135–145)

## 2021-06-30 LAB — RESP PANEL BY RT-PCR (FLU A&B, COVID) ARPGX2
Influenza A by PCR: NEGATIVE
Influenza B by PCR: NEGATIVE
SARS Coronavirus 2 by RT PCR: NEGATIVE

## 2021-06-30 LAB — URINALYSIS, ROUTINE W REFLEX MICROSCOPIC
Bacteria, UA: NONE SEEN
Bilirubin Urine: NEGATIVE
Glucose, UA: NEGATIVE mg/dL
Hgb urine dipstick: NEGATIVE
Ketones, ur: 5 mg/dL — AB
Nitrite: NEGATIVE
Protein, ur: NEGATIVE mg/dL
Specific Gravity, Urine: 1.014 (ref 1.005–1.030)
pH: 6 (ref 5.0–8.0)

## 2021-06-30 LAB — CBC WITH DIFFERENTIAL/PLATELET
Abs Immature Granulocytes: 0.04 10*3/uL (ref 0.00–0.07)
Basophils Absolute: 0 10*3/uL (ref 0.0–0.1)
Basophils Relative: 0 %
Eosinophils Absolute: 0 10*3/uL (ref 0.0–0.5)
Eosinophils Relative: 0 %
HCT: 41.6 % (ref 36.0–46.0)
Hemoglobin: 13.7 g/dL (ref 12.0–15.0)
Immature Granulocytes: 0 %
Lymphocytes Relative: 2 %
Lymphs Abs: 0.3 10*3/uL — ABNORMAL LOW (ref 0.7–4.0)
MCH: 30.3 pg (ref 26.0–34.0)
MCHC: 32.9 g/dL (ref 30.0–36.0)
MCV: 92 fL (ref 80.0–100.0)
Monocytes Absolute: 0.3 10*3/uL (ref 0.1–1.0)
Monocytes Relative: 2 %
Neutro Abs: 12.7 10*3/uL — ABNORMAL HIGH (ref 1.7–7.7)
Neutrophils Relative %: 96 %
Platelets: 154 10*3/uL (ref 150–400)
RBC: 4.52 MIL/uL (ref 3.87–5.11)
RDW: 13.7 % (ref 11.5–15.5)
WBC: 13.4 10*3/uL — ABNORMAL HIGH (ref 4.0–10.5)
nRBC: 0 % (ref 0.0–0.2)

## 2021-06-30 LAB — CK: Total CK: 39 U/L (ref 38–234)

## 2021-06-30 MED ORDER — SODIUM CHLORIDE 0.9 % IV BOLUS
1000.0000 mL | Freq: Once | INTRAVENOUS | Status: AC
  Administered 2021-06-30: 1000 mL via INTRAVENOUS

## 2021-06-30 MED ORDER — METOCLOPRAMIDE HCL 5 MG/ML IJ SOLN
10.0000 mg | Freq: Once | INTRAMUSCULAR | Status: AC
  Administered 2021-06-30: 10 mg via INTRAVENOUS
  Filled 2021-06-30: qty 2

## 2021-06-30 MED ORDER — DIPHENHYDRAMINE HCL 50 MG/ML IJ SOLN
12.5000 mg | Freq: Once | INTRAMUSCULAR | Status: AC
  Administered 2021-06-30: 12.5 mg via INTRAVENOUS
  Filled 2021-06-30: qty 1

## 2021-06-30 MED ORDER — SODIUM CHLORIDE 0.9 % IV SOLN: INTRAVENOUS | Status: DC

## 2021-06-30 MED ORDER — CARBAMAZEPINE ER 100 MG PO TB12: 100.0000 mg | ORAL_TABLET | Freq: Every day | ORAL | 0 refills | Status: DC

## 2021-06-30 MED ORDER — LORAZEPAM 2 MG/ML IJ SOLN
0.5000 mg | Freq: Once | INTRAMUSCULAR | Status: AC
  Administered 2021-06-30: 0.5 mg via INTRAVENOUS
  Filled 2021-06-30: qty 1

## 2021-06-30 MED ORDER — NALOXONE HCL 0.4 MG/ML IJ SOLN
0.4000 mg | INTRAMUSCULAR | Status: DC | PRN
  Administered 2021-06-30: 0.4 mg via INTRAVENOUS
  Filled 2021-06-30: qty 1

## 2021-06-30 MED ORDER — HYDROMORPHONE HCL 1 MG/ML IJ SOLN
1.0000 mg | Freq: Once | INTRAMUSCULAR | Status: AC
  Administered 2021-06-30: 1 mg via INTRAVENOUS
  Filled 2021-06-30: qty 1

## 2021-06-30 MED ORDER — KETOROLAC TROMETHAMINE 15 MG/ML IJ SOLN
15.0000 mg | Freq: Once | INTRAMUSCULAR | Status: AC
  Administered 2021-06-30: 15 mg via INTRAVENOUS
  Filled 2021-06-30: qty 1

## 2021-06-30 NOTE — ED Provider Notes (Signed)
?[facility] ?Provider Note ? ? ?CSN: [id number] ?Arrival date & time: 06/30/21  1303 ? ?  ? ?History ? ?Chief Complaint  ?Patient presents with  ? Headache  ? ? ?Elizabeth Mcconnell is a 76 y.o. female. ? ?Pt is a 76 yo female who has a hx of occipital neuralgia, afib, htn, and anxiety.  Pt said she has seen NS who offered an occipital nerve ablation vs block.  She refused this because she was worried about chronic numbness.  She saw neurology who recommended starting medication.  She has been on sertraline which did not help.  Duloxetine made her feel terrible.  She has been on gabapentin for 5 days which is not helping.  She woke up this am and had severe pain.  She felt very tired and felt exhausted.  Pt did take a xanax which did help.   ? ? ?  ? ?Home Medications ?Prior to Admission medications   ?Medication Sig Start Date End Date Taking? Authorizing Provider  ?carbamazepine (TEGRETOL-XR) 100 MG 12 hr tablet Take 1 tablet (100 mg total) by mouth daily. 06/30/21 07/30/21 Yes Noemi Chapel, MD  ?acetaminophen (TYLENOL) 650 MG CR tablet Take 1,300 mg by mouth 2 (two) times daily as needed for pain.     [provider]  ?ALPRAZolam Duanne Moron) 0.25 MG tablet TAKE 1 TABLET BY MOUTH AT BEDTIME AS NEEDED FOR ANXIETY OR SLEEP 08/15/19   Janith Lima, MD  ?amLODipine (NORVASC) 2.5 MG tablet TAKE 1 TABLET BY MOUTH EVERY DAY 11/28/20   Allred, Jeneen Rinks, MD  ?budesonide-formoterol (SYMBICORT) 160-4.5 MCG/ACT inhaler Take 2 puffs first thing in am and then another 2 puffs about 12 hours later. ?Patient taking differently: Take 1 puff first thing in am and then another 1 puff about 12 hours later. 08/18/20   Tanda Rockers, MD  ?BYSTOLIC 5 MG tablet TAKE 1 TABLET BY MOUTH EVERY DAY 09/17/19   Janith Lima, MD  ?Calcium Carb-Cholecalciferol 600-800 MG-UNIT TABS Take 1 tablet by mouth daily.    [provider]  ?dofetilide (TIKOSYN) 250 MCG capsule TAKE 1 CAPSULE BY MOUTH 2  TIMES DAILY 04/25/21   Sherran Needs, NP  ?DULoxetine (CYMBALTA) 30 MG capsule Take 1 capsule daily for 14 days, then increase to 2 capsules daily. 06/19/21   Jaffe, Adam R, DO  ?ELIQUIS 5 MG TABS tablet TAKE 1 TABLET BY MOUTH 2 TIMES DAILY 02/07/21   Allred, Jeneen Rinks, MD  ?fluticasone (FLONASE) 50 MCG/ACT nasal spray USE 2 SPRAYS IN EACH NOSTRIL 2 TIMES DAILY -will last 15 DAYS 05/11/21   Tanda Rockers, MD  ?gabapentin (NEURONTIN) 100 MG capsule Take 1 capsule (100 mg total) by mouth at bedtime. 06/25/21   Pieter Partridge, DO  ?levalbuterol [facility] HFA) 45 MCG/ACT inhaler inhale 1-2 PUFFS BY MOUTH EVERY 4 HOURS AS NEEDED 06/04/21   Tanda Rockers, MD  ?losartan (COZAAR) 100 MG tablet TAKE 1 TABLET BY MOUTH EVERY DAY 05/03/21   Sherran Needs, NP  ?magnesium oxide (MAG-OX) 400 MG tablet Take 1 tablet (400 mg total) by mouth daily. 02/13/15   Sherren Mocha, MD  ?montelukast (SINGULAIR) 10 MG tablet TAKE 1 TABLET BY MOUTH EVERY DAY 11/15/20   Tanda Rockers, MD  ?potassium chloride (KLOR-CON) 10 MEQ tablet TAKE 2 TABLETS BY MOUTH EVERY DAY ?Patient taking differently: Takes 73mq by mouth daily 09/26/20   Allred, JJeneen Rinks MD  ?pravastatin (PRAVACHOL) 40 MG tablet Take 1 tablet by mouth daily.  07/04/20   [provider]  ?   ? ?Allergies    ?Tramadol, Amoxicillin-pot clavulanate, Amoxicillin, Flagyl [metronidazole], Flecainide, Omnicef [cefdinir], and Avelox [moxifloxacin]   ? ?Review of Systems   ?Review of Systems  ?Neurological:  Positive for headaches.  ?All other systems reviewed and are negative. ? ?Physical Exam ?Updated Vital Signs ?BP 123/69   Pulse 73   Temp 99.6 ?F (37.6 ?C) (Oral)   Resp (!) 21   SpO2 94%  ?Physical Exam ?Vitals and nursing note reviewed.  ?Constitutional:   ?   Appearance: She is well-developed.  ?HENT:  ?   Head: Normocephalic and atraumatic.  ?   Mouth/Throat:  ?   Mouth: Mucous membranes are moist.  ?   Pharynx: Oropharynx is clear.  ?Cardiovascular:  ?   Rate and Rhythm: Normal  rate and regular rhythm.  ?   Heart sounds: Normal heart sounds.  ?Pulmonary:  ?   Effort: Pulmonary effort is normal.  ?   Breath sounds: Normal breath sounds.  ?Abdominal:  ?   General: Bowel sounds are normal.  ?   Palpations: Abdomen is soft.  ?Musculoskeletal:     ?   General: Normal range of motion.  ?   Cervical back: Normal range of motion and neck supple.  ?Skin: ?   General: Skin is warm.  ?Neurological:  ?   Mental Status: She is alert and oriented to person, place, and time.  ?Psychiatric:     ?   Mood and Affect: Mood normal.     ?   Speech: Speech normal.     ?   Behavior: Behavior normal.  ? ? ?ED Results / Procedures / Treatments   ?Labs ?(all labs ordered are listed, but only abnormal results are displayed) ?Labs Reviewed  ?CBC WITH DIFFERENTIAL/PLATELET - Abnormal; Notable for the following components:  ?    Result Value  ? WBC 13.4 (*)   ? Neutro Abs 12.7 (*)   ? Lymphs Abs 0.3 (*)   ? All other components within normal limits  ?BASIC METABOLIC PANEL - Abnormal; Notable for the following components:  ? CO2 20 (*)   ? Creatinine, Ser 1.08 (*)   ? GFR, Estimated 53 (*)   ? All other components within normal limits  ?URINALYSIS, ROUTINE W REFLEX MICROSCOPIC - Abnormal; Notable for the following components:  ? APPearance HAZY (*)   ? Ketones, ur 5 (*)   ? Leukocytes,Ua SMALL (*)   ? All other components within normal limits  ?RESP PANEL BY RT-PCR (FLU A&B, COVID) ARPGX2  ?CK  ? ? ?EKG ?None ? ?Radiology ?DG Chest Portable 1 View ? ?Result Date: 06/30/2021 ?CLINICAL DATA:  A 76 year old female presents with fever. EXAM: PORTABLE CHEST 1 VIEW COMPARISON:  Aug 01, 2020. FINDINGS: EKG leads project over the chest. Cardiomediastinal contours and hilar structures are grossly stable with scarring along the heart border on the LEFT. No lobar consolidation.  No sign of pneumothorax. Slight added density over the LEFT lung base is of uncertain significance. Image rotated slightly to the RIGHT. On limited  assessment there is no acute skeletal process. IMPRESSION: Slight added density over the LEFT lung base may reflect changes related to rotation and overlapping soft tissues. Difficult to exclude LEFT-sided pleural effusion. Electronically Signed   By: Zetta Bills M.D.   On: 06/30/2021 16:41   ? ?Procedures ?Procedures  ? ? ?Medications Ordered in ED ?Medications  ?sodium chloride 0.9 % bolus 1,000 mL (0  mLs Intravenous Stopped 06/30/21 1658)  ?ketorolac (TORADOL) 15 MG/ML injection 15 mg (15 mg Intravenous Given 06/30/21 1441)  ?metoCLOPramide (REGLAN) injection 10 mg (10 mg Intravenous Given 06/30/21 1444)  ?diphenhydrAMINE (BENADRYL) injection 12.5 mg (12.5 mg Intravenous Given 06/30/21 1445)  ?LORazepam (ATIVAN) injection 0.5 mg (0.5 mg Intravenous Given 06/30/21 1448)  ?HYDROmorphone (DILAUDID) injection 1 mg (1 mg Intravenous Given 06/30/21 1935)  ? ? ?ED Course/ Medical Decision Making/ A&P ?  ?                        ?Medical Decision Making ?Amount and/or Complexity of Data Reviewed ?Labs: ordered. ?Radiology: ordered. ? ?Risk ?Prescription drug management. ? ? ?This patient presents to the ED for concern of headache, this involves an extensive number of treatment options, and is a complaint that carries with it a high risk of complications and morbidity.  The differential diagnosis includes occipital neuralgia, anxiety, electrolyte abn, infection ? ? ?Co morbidities that complicate the patient evaluation ? ?occipital neuralgia, afib, htn, and anxiety ? ? ?Additional history obtained: ? ?Additional history obtained from epic chart review ? ? ? ?Lab Tests: ? ?I Ordered, and personally interpreted labs.  The pertinent results include:  cbc with wbc slightly elevated at 13.4; ua neg; covid/flu neg/ck nl; bmp nl ? ? ?Imaging Studies ordered: ? ?I ordered imaging studies including CXR  ?I independently visualized and interpreted imaging which showed  ?  ?IMPRESSION:  ?Slight added density over the LEFT lung base may  reflect changes  ?related to rotation and overlapping soft tissues. Difficult to  ?exclude LEFT-sided pleural effusion.  ? ?I agree with the radiologist interpretation ? ? ?Cardiac Monitoring: ? ?The patient was m

## 2021-06-30 NOTE — ED Notes (Signed)
Patient reports controlled pain with gabapentin until this morning. No changes in other meds. Patient reports one glass of wine last night with dinner was the only difference in previous days. ?

## 2021-06-30 NOTE — ED Triage Notes (Signed)
Via EMS. Pt came from home and reports R sided occipital neuralgia pain with headache. Hx of occipital neuralgia-takes home meds with no relief. Pt took 0.'25mg'$  Xanax PTA. Pt states that when pain occurs she gets "anxious". GCS 15. Denies blurred vision  ? ?BP 126/90 ?HR 96 ?RR 22 ?O2 96 RA  ?

## 2021-06-30 NOTE — ED Provider Notes (Signed)
This patient was accepted at change of shift, she reports that she has had some intermittent and fluctuating discomfort in her head ever since New Year's Eve, since that time she has had good days and bad days and has been seen by neurology because of what is thought to be occipital neuralgia.  She has been on multiple medications including Cymbalta and most recently gabapentin for the last 5 days.  [place] Mylar was a very good day and she was even able to drive last night, she felt great, this morning she woke up and felt shaky, cold like she had chills, feeling headaches that were ongoing throughout the day.  This is similar to what she has had in the past but more intense.  Since in the emergency department she has been given Toradol, metoclopramide, Benadryl, Ativan, IV fluids and ultimately some hydromorphone with which her symptoms improved but she is now somewhat sedated. ? ?She will be given Narcan, her labs have been totally unremarkable, her oxygen is dropped no lower than 89% on room air, when I have her talk and wake up in the room she comes back to 95%. ? ?Per Dr. Gilford Raid she would like her to start taking Tegretol and to follow-up closely with neurology.  I will send a note to neurology prior to discharge.  The anticipated follow-up would be within 1 week.  The patient is agreeable to the plan. ?  ?Noemi Chapel, MD ?06/30/21 2038 ? ?

## 2021-06-30 NOTE — Discharge Instructions (Signed)
All of your testing has been reassuring, unfortunately you are struggling with a headache disorder that is not easy to treat.  I have contacted your neurologist and asked them to get you into the office for an expedited work-up.  If you are developing severe or worsening symptoms please return to the emergency department however at this time with all of these test being reassuring I would like for you to stay at home for a couple of days, drink plenty of clear liquids and take the new medication called Tegretol once a day.  I would like for you to see your doctor within 1 week for recheck.  I will ask them to have the office reach out to you to get you in sooner if possible ?

## 2021-07-02 NOTE — Progress Notes (Signed)
? ?Virtual Visit via Video Note ?The purpose of this virtual visit is to provide medical care while limiting exposure to the novel coronavirus.   ? ?Consent was obtained for video visit:  Yes.   ?Answered questions that patient had about telehealth interaction:  Yes.   ?I discussed the limitations, risks, security and privacy concerns of performing an evaluation and management service by telemedicine. I also discussed with the patient that there may be a patient responsible charge related to this service. The patient expressed understanding and agreed to proceed. ? ?Pt location: Home ?Physician Location: office ?Name of referring provider:  [name], MD ?I connected with Elizabeth Mcconnell at patients initiation/request on 07/03/2021 at  1:30 PM EDT by video enabled telemedicine application and verified that I am speaking with the correct person using two identifiers. ?Pt MRN:  [id number] ?Pt DOB:  [birth year] ?Video Participants:  Elizabeth Mcconnell ? ?Assessment and Plan:   ?Right sided occipital neuralgia   ?Generalized anxiety disorder, aggravated by #1 ?Compressive bilateral vertebral artery stenosis, asymptomatic ? ?  ?1  As carbamazepine may decrease efficacy of Eliquis, I wouldn't start.  Instead, will start pregablin '50mg'$  twice daily.  We can increase dose if needed.   ?2  I will refer her to an interventional pain specialist to evaluate for upper cervical nerve ablations as she is so sensitive to medications ?2  Follow up with PCP regarding optimizing management for anxiety. ?3  Follow up in 4 to 5 months. ? ?History of Present Illness:  ?Elizabeth Mcconnell is a 76 year old female with a fib, arthritis of spine and hands, HTN, and anxiety who follows up for occipital neuralgia. ? ?UPDATE: ?Current medications: carbamazepine '100mg'$  daily,  ? ?Endorsed side effect to duloxetine, so started gabapentin '100mg'$  at bedtime 5 days later, on 4/29, she started experiencing nausea, dizziness, paresthesias,  and tremulousness which she attributed to the gabapenti.  She was started on carbamazepine but she never started it.  She stopped gabapentin.   ?  ?HISTORY: ?Started on New Year's Eve.  She was watching a show at [employer].  When she turned her head to the left to look at the performers, she started experiencing pressure and tingling on the right sided of neck and head.  When she left, she felt dizzy.  No longer dizzy.  It is not as severe now, but If she turns her head to the left, she feels like a "wave of fingers" moving up the right side of her neck and right side of head.  No associated pain but it causes a great deal of anxiety.  She has significant anxiety and this has been aggravating it.  Occasional associated soreness on top of head.  CTA head and neck on 03/08/2021 personally reviewed showed severe right eccentric degenerative changes at C1-2 and moderate right vertebral artery stenosis at C1-2 level and moderate left vertebral artery stenosis at C2-3 level in part due to mass effect from the adjacent degenerative osteophytes.  Also showed severe multilevel degenerative changes with severe DDD at C5-6 and C6-7.  No pain and weakness in the right arm.  If she rests her right arm over a pillow, she may feel numbness for 30 seconds.  She saw neurosurgery.  She received who was going to do a cerebral angiogram but did not believe it was vascular.  She received an what I think was an occipital nerve block which was helpful for a day.  An ablation was recommended but she declined due  to possibility of chronic numbness.  Tried sertraline for 3 days for anxiety but stopped because she felt more nervous and shaky.   ? ?Past medications:  duloxetine (side effect), gabapentin (side effect) ? ?Past Medical History: ?Past Medical History:  ?Diagnosis Date  ? Adenomatous polyp of colon 10/2013  ? f/u colo 10/2018  ? Allergic rhinitis   ? ALLERGIC RHINITIS   ? ANXIETY   ? "situational" (02/14/2015)  ? Arthritis   ? "neck,  back, hands" (02/14/2015)  ? Asthma   ? Atrial fibrillation with RVR ([place])   ? Atrial flutter ([place])   ? failed cardioversion 11/2012; s/p ablation 02-02-2013 by Dr Rayann Heman  ? Chronic bronchitis ([place])   ? "I'll have it most years" (02/14/2015)  ? Diverticulosis   ? Endometriosis   ? Essential hypertension 05/30/2014  ? Hepatic steatosis   ? Oral candidiasis   ? Osteopenia 08/2015  ? T score -1.9 FRAX 16% / 6.5%  ? Pneumothorax on left 1996  ? left lower lung collapse s/p resection  ? Small bowel obstruction ([place])   ? Tobacco abuse   ? ? ?Medications: ?Outpatient Encounter Medications as of 07/03/2021  ?Medication Sig  ? acetaminophen (TYLENOL) 650 MG CR tablet Take 1,300 mg by mouth 2 (two) times daily as needed for pain.   ? ALPRAZolam (XANAX) 0.25 MG tablet TAKE 1 TABLET BY MOUTH AT BEDTIME AS NEEDED FOR ANXIETY OR SLEEP  ? amLODipine (NORVASC) 2.5 MG tablet TAKE 1 TABLET BY MOUTH EVERY DAY  ? budesonide-formoterol (SYMBICORT) 160-4.5 MCG/ACT inhaler Take 2 puffs first thing in am and then another 2 puffs about 12 hours later. (Patient taking differently: Take 1 puff first thing in am and then another 1 puff about 12 hours later.)  ? BYSTOLIC 5 MG tablet TAKE 1 TABLET BY MOUTH EVERY DAY  ? Calcium Carb-Cholecalciferol 600-800 MG-UNIT TABS Take 1 tablet by mouth daily.  ? carbamazepine (TEGRETOL-XR) 100 MG 12 hr tablet Take 1 tablet (100 mg total) by mouth daily.  ? dofetilide (TIKOSYN) 250 MCG capsule TAKE 1 CAPSULE BY MOUTH 2 TIMES DAILY  ? DULoxetine (CYMBALTA) 30 MG capsule Take 1 capsule daily for 14 days, then increase to 2 capsules daily.  ? ELIQUIS 5 MG TABS tablet TAKE 1 TABLET BY MOUTH 2 TIMES DAILY  ? fluticasone (FLONASE) 50 MCG/ACT nasal spray USE 2 SPRAYS IN EACH NOSTRIL 2 TIMES DAILY -will last 15 DAYS  ? gabapentin (NEURONTIN) 100 MG capsule Take 1 capsule (100 mg total) by mouth at bedtime.  ? levalbuterol (XOPENEX HFA) 45 MCG/ACT inhaler inhale 1-2 PUFFS BY MOUTH EVERY 4 HOURS AS NEEDED  ? losartan  (COZAAR) 100 MG tablet TAKE 1 TABLET BY MOUTH EVERY DAY  ? magnesium oxide (MAG-OX) 400 MG tablet Take 1 tablet (400 mg total) by mouth daily.  ? montelukast (SINGULAIR) 10 MG tablet TAKE 1 TABLET BY MOUTH EVERY DAY  ? potassium chloride (KLOR-CON) 10 MEQ tablet TAKE 2 TABLETS BY MOUTH EVERY DAY (Patient taking differently: Takes 63mq by mouth daily)  ? pravastatin (PRAVACHOL) 40 MG tablet Take 1 tablet by mouth daily.  ? ?No facility-administered encounter medications on file as of 07/03/2021.  ? ? ?Allergies: ?Allergies  ?Allergen Reactions  ? Tramadol Other (See Comments)  ?  Reaction:  Dizziness and weakness   ? Amoxicillin-Pot Clavulanate Nausea And Vomiting and Other (See Comments)  ?  Has patient had a PCN reaction causing immediate rash, facial/tongue/throat swelling, SOB or lightheadedness with hypotension: No ?Has patient  had a PCN reaction causing severe rash involving mucus membranes or skin necrosis: No ?Has patient had a PCN reaction that required hospitalization No ?Has patient had a PCN reaction occurring within the last 10 years: No ?If all of the above answers are "NO", then may proceed with Cephalosporin use.  ? Amoxicillin Other (See Comments)  ? Flagyl [Metronidazole] Nausea And Vomiting  ? Flecainide Other (See Comments)  ?  Dizziness   ? Omnicef [Cefdinir] Nausea And Vomiting  ? Avelox [Moxifloxacin] Hives and Itching  ? ? ?Family History: ?Family History  ?Problem Relation Age of Onset  ? Asthma Maternal Grandfather   ? Stroke Father 75  ? Hypertension Father   ? Heart disease Father   ? Diabetes Mother   ? Asthma Child   ? Colon cancer Neg Hx   ? Rectal cancer Neg Hx   ? Stomach cancer Neg Hx   ? ? ?Observations/Objective:   ?No acute distress.  Alert and oriented.  Speech fluent and not dysarthric.  Language intact.  Eyes orthophoric on primary gaze.  Face symmetric. ? ? ?Follow Up Instructions: ?  ? -I discussed the assessment and treatment plan with the patient. The patient was provided an  opportunity to ask questions and all were answered. The patient agreed with the plan and demonstrated an understanding of the instructions. ?  ?The patient was advised to call back or seek an in-person evalua

## 2021-07-03 ENCOUNTER — Telehealth: Payer: Self-pay | Admitting: Neurology

## 2021-07-03 ENCOUNTER — Telehealth (INDEPENDENT_AMBULATORY_CARE_PROVIDER_SITE_OTHER): Payer: Medicare PPO | Admitting: Neurology

## 2021-07-03 ENCOUNTER — Telehealth: Payer: Medicare PPO | Admitting: Neurology

## 2021-07-03 ENCOUNTER — Encounter: Payer: Self-pay | Admitting: Neurology

## 2021-07-03 VITALS — Ht 65.0 in | Wt 150.0 lb

## 2021-07-03 DIAGNOSIS — F411 Generalized anxiety disorder: Secondary | ICD-10-CM

## 2021-07-03 DIAGNOSIS — M5481 Occipital neuralgia: Secondary | ICD-10-CM

## 2021-07-03 NOTE — Telephone Encounter (Signed)
Patient would like the lyrica called into the [facility]  she said that she would like to go ahead a try per her visit with Tomi Likens today  ?

## 2021-07-04 MED ORDER — PREGABALIN 50 MG PO CAPS: 50.0000 mg | ORAL_CAPSULE | Freq: Two times a day (BID) | ORAL | 5 refills | Status: DC

## 2021-07-04 NOTE — Addendum Note (Signed)
Addended by: Metta Clines R on: 07/04/2021 06:36 AM ? ? Modules accepted: Level of Service ? ?

## 2021-07-05 ENCOUNTER — Ambulatory Visit: Payer: Medicare PPO | Admitting: Psychiatry

## 2021-07-09 ENCOUNTER — Other Ambulatory Visit: Payer: Self-pay

## 2021-07-09 DIAGNOSIS — M5481 Occipital neuralgia: Secondary | ICD-10-CM

## 2021-07-09 NOTE — Progress Notes (Signed)
interventional pain specialist :evaluate for upper cervical nerve ablations as she is so sensitive to medications ?

## 2021-07-11 DIAGNOSIS — E0590 Thyrotoxicosis, unspecified without thyrotoxic crisis or storm: Secondary | ICD-10-CM | POA: Diagnosis not present

## 2021-07-11 DIAGNOSIS — E785 Hyperlipidemia, unspecified: Secondary | ICD-10-CM | POA: Diagnosis not present

## 2021-07-11 DIAGNOSIS — R5383 Other fatigue: Secondary | ICD-10-CM | POA: Diagnosis not present

## 2021-07-11 DIAGNOSIS — I700 Atherosclerosis of aorta: Secondary | ICD-10-CM | POA: Diagnosis not present

## 2021-07-18 DIAGNOSIS — G4486 Cervicogenic headache: Secondary | ICD-10-CM | POA: Diagnosis not present

## 2021-07-18 DIAGNOSIS — M542 Cervicalgia: Secondary | ICD-10-CM | POA: Diagnosis not present

## 2021-07-23 ENCOUNTER — Other Ambulatory Visit: Payer: Self-pay | Admitting: Neurology

## 2021-07-27 DIAGNOSIS — Z79899 Other long term (current) drug therapy: Secondary | ICD-10-CM | POA: Diagnosis not present

## 2021-07-27 DIAGNOSIS — F4323 Adjustment disorder with mixed anxiety and depressed mood: Secondary | ICD-10-CM | POA: Diagnosis not present

## 2021-07-27 DIAGNOSIS — F410 Panic disorder [episodic paroxysmal anxiety] without agoraphobia: Secondary | ICD-10-CM | POA: Diagnosis not present

## 2021-07-31 DIAGNOSIS — M8589 Other specified disorders of bone density and structure, multiple sites: Secondary | ICD-10-CM | POA: Diagnosis not present

## 2021-08-01 DIAGNOSIS — M47812 Spondylosis without myelopathy or radiculopathy, cervical region: Secondary | ICD-10-CM | POA: Diagnosis not present

## 2021-08-02 ENCOUNTER — Ambulatory Visit: Payer: Medicare PPO | Admitting: Internal Medicine

## 2021-08-02 ENCOUNTER — Ambulatory Visit: Payer: Medicare PPO | Admitting: Psychiatry

## 2021-08-04 ENCOUNTER — Other Ambulatory Visit (HOSPITAL_COMMUNITY): Payer: Self-pay | Admitting: Internal Medicine

## 2021-08-04 ENCOUNTER — Other Ambulatory Visit: Payer: Self-pay | Admitting: Internal Medicine

## 2021-08-06 ENCOUNTER — Ambulatory Visit: Payer: Medicare PPO | Admitting: Internal Medicine

## 2021-08-06 ENCOUNTER — Encounter: Payer: Self-pay | Admitting: Internal Medicine

## 2021-08-06 DIAGNOSIS — J4540 Moderate persistent asthma, uncomplicated: Secondary | ICD-10-CM

## 2021-08-06 DIAGNOSIS — F17210 Nicotine dependence, cigarettes, uncomplicated: Secondary | ICD-10-CM | POA: Diagnosis not present

## 2021-08-06 MED ORDER — BREZTRI AEROSPHERE 160-9-4.8 MCG/ACT IN AERO: INHALATION_SPRAY | RESPIRATORY_TRACT | 11 refills | Status: DC

## 2021-08-06 NOTE — Progress Notes (Unsigned)
Subjective:   Patient ID: Elizabeth Mcconnell, female    DOB: [birth year]    MRN: [id number]  Brief patient profile:  73  yowf active smoker with "asthma all her life" previous pt of Dr Joya Gaskins and Ashok Cordia with moderate asthma criteria vs copd GOLD II as of pfts 07/01/2017       History of Present Illness  03/25/2016 acute extended ov/Janard Culp re:  Chronic asthma maint flovent hfa ? Still smoking ?  Chief Complaint  Patient presents with   Acute Visit    Pt c/o chest and sinus congestion for the past 2 wks. She feels very tired and states that she has chills "all the time"- no fever. She has had a prod cough with grey sputum.    baseline doing fine on  flovent hfa / singulair not needing any rescue with little flare of cough attributes to  Uri dec 2017 took doxy and all better until 2 weeks prior to OV with cough thick grey mucus x 2 weeks worse in ams with lots of sinus congestion and grey mucus, some chills but no fever / sweats - even with flare not using xopenex and not sure how much she can "maybe 2 pffs a day"  rec Doxycycline 100 mg twice daily x 10 days Prednisone 10 mg take  4 each am x 2 days,   2 each am x 2 days,  1 each am x 2 days and stop  Work on inhaler technique: For cough / congestion mucinex 1200 mg every 12 hours as needed and use the xopenex hfa more regularly to improve the mucus flow       06/01/20  NP rec trelegy    07/24/2020  f/u ov/Starlyn Droge re: moderate asthma vs copd  On trelegy hs / still smoking  Chief Complaint  Patient presents with   Follow-up    Fatigue   Dyspnea:  Very sedentary  Cough: none  Sleeping: bed is flat one pillow  SABA use: rarely  02: none  Covid status:  vax x 4  One twice daily  nasonex seems to help nasal symptoms Rec  Plan A = Automatic = Always=    Trelegy one click first thing each am  - take two good drags  Work on inhaler technique Plan B = Backup (to supplement plan A, not to replace it) Only use your albuterol inhaler as a  rescue medication  Nasonex can be used up to 2 puffs in each nostril every 12 hours but typically once better ok to adjust down to lowest effective dose.     01/16/2021  f/u ov/Viktoria Gruetzmacher re: mod asthma vs copd  GOLD 2  maint on symbicort one bid   Chief Complaint  Patient presents with   Follow-up    Breathing is overall doing well and no new co's. She rarely uses her xopenex inhaler.    Dyspnea: water aerobics twice weekly  Cough: none  Sleeping: flat bed one pillow / no resp cc  SABA use: very rarely  02: none  Covid status:   vax x ? 5  includes the bivalent  Rec Suggested e-cigs as an optional  "one way bridge"  Off all tobacco products No change in your medications    08/06/2021  f/u ov/Marbella Markgraf re: AB  maint on symbicort 160 1 bid   Chief Complaint  Patient presents with   Follow-up    She has had increased anxiety and feels this has caused her to feel more  SOB. She has occ cough with white to light yellow sputum. She is using at least once per day.   Dyspnea:  no longer doing anything aerobic / steps are  Cough: not some bed Sleeping: no resp cc  SABA use: occ daytime 02: none     No obvious day to day or daytime variability or assoc excess/ purulent sputum or mucus plugs or hemoptysis or cp or chest tightness, subjective wheeze or overt sinus or hb symptoms.   *** without nocturnal  or early am exacerbation  of respiratory  c/o's or need for noct saba. Also denies any obvious fluctuation of symptoms with weather or environmental changes or other aggravating or alleviating factors except as outlined above   No unusual exposure hx or h/o childhood pna/ asthma or knowledge of premature birth.  Current Allergies, Complete Past Medical History, Past Surgical History, Family History, and Social History were reviewed in [employer] record.  ROS  The following are not active complaints unless bolded Hoarseness, sore throat, dysphagia, dental problems, itching,  sneezing,  nasal congestion or discharge of excess mucus or purulent secretions, ear ache,   fever, chills, sweats, unintended wt loss or wt gain, classically pleuritic or exertional cp,  orthopnea pnd or arm/hand swelling  or leg swelling, presyncope, palpitations, abdominal pain, anorexia, nausea, vomiting, diarrhea  or change in bowel habits or change in bladder habits, change in stools or change in urine, dysuria, hematuria,  rash, arthralgias, visual complaints, headache, numbness, weakness or ataxia or problems with walking or coordination,  change in mood or  memory.        Current Meds  Medication Sig   acetaminophen (TYLENOL) 650 MG CR tablet Take 1,300 mg by mouth 2 (two) times daily as needed for pain.    ALPRAZolam (XANAX) 0.25 MG tablet TAKE 1 TABLET BY MOUTH AT BEDTIME AS NEEDED FOR ANXIETY OR SLEEP   amLODipine (NORVASC) 2.5 MG tablet TAKE 1 TABLET BY MOUTH EVERY DAY   busPIRone (BUSPAR) 10 MG tablet Take 10 mg by mouth 2 (two) times daily.   BYSTOLIC 5 MG tablet TAKE 1 TABLET BY MOUTH EVERY DAY   Calcium Carb-Cholecalciferol 600-800 MG-UNIT TABS Take 1 tablet by mouth daily.   dofetilide (TIKOSYN) 250 MCG capsule TAKE 1 CAPSULE BY MOUTH 2 TIMES DAILY   ELIQUIS 5 MG TABS tablet TAKE 1 TABLET BY MOUTH 2 TIMES DAILY   fluticasone (FLONASE) 50 MCG/ACT nasal spray USE 2 SPRAYS IN EACH NOSTRIL 2 TIMES DAILY -will last 15 DAYS   levalbuterol (XOPENEX HFA) 45 MCG/ACT inhaler inhale 1-2 PUFFS BY MOUTH EVERY 4 HOURS AS NEEDED   losartan (COZAAR) 100 MG tablet TAKE 1 TABLET BY MOUTH EVERY DAY   magnesium oxide (MAG-OX) 400 MG tablet Take 1 tablet (400 mg total) by mouth daily.   mirtazapine (REMERON) 15 MG tablet Take 15 mg by mouth at bedtime.   montelukast (SINGULAIR) 10 MG tablet TAKE 1 TABLET BY MOUTH EVERY DAY   potassium chloride (KLOR-CON) 10 MEQ tablet TAKE 2 TABLETS BY MOUTH EVERY DAY   pravastatin (PRAVACHOL) 40 MG tablet Take 1 tablet by mouth daily.   [DISCONTINUED]  budesonide-formoterol (SYMBICORT) 160-4.5 MCG/ACT inhaler Take 2 puffs first thing in am and then another 2 puffs about 12 hours later. (Patient taking differently: Take 1 puff first thing in am and then another 1 puff about 12 hours later.)                Objective:  Physical Exam  08/06/2021            ***  01/16/2021       152   07/24/2020         159  10/22/2019       153 09/16/2018        151 07/01/2017        140  02/19/2017      147   03/25/16 156 lb 12.8 oz (71.1 kg)  02/12/16 155 lb 6.4 oz (70.5 kg)  11/28/15 150 lb 12 oz (68.4 kg)    Vital signs reviewed  08/06/2021  - Note at rest 02 sats  ***% on ***   General appearance:    ***     Min bar***                    Assessment & Plan:

## 2021-08-06 NOTE — Patient Instructions (Addendum)
Plan A = Automatic = Always=    Breztri  2 puffs in am and  1-2 puffs in pm if needed  Work on inhaler technique:  relax and gently blow all the way out then take a nice smooth full deep breath back in, triggering the inhaler at same time you start breathing in.  Hold for up to 5 seconds if you can. Blow out thru nose. Rinse and gargle with water when done.  If mouth or throat bother you at all,  try brushing teeth/gums/tongue with arm and hammer toothpaste/ make a slurry and gargle and spit out.       Plan B = Backup (to supplement plan A, not to replace it) Only use your albuterol inhaler as a rescue medication to be used if you can't catch your breath by resting or doing a relaxed purse lip breathing pattern.  - The less you use it, the better it will work when you need it. - Ok to use the inhaler up to 2 puffs  every 4 hours if you must but call for appointment if use goes up over your usual need - Don't leave home without it !!  (think of it like the spare tire for your car)    Please schedule a follow up office visit in 6 weeks, call sooner if needed with pfts after your morning breztri

## 2021-08-07 ENCOUNTER — Encounter: Payer: Self-pay | Admitting: Internal Medicine

## 2021-08-07 NOTE — Assessment & Plan Note (Signed)
Onset "all her life"/ active smoker  Spirometry  09/25/15 FEV1 1.58  (70%)  Ratio 55 on flovent 110 2bid  PFT  09/25/15:  FEV1 1.58 L (70%) FEV1/FVC 0.55   73% DLCO uncorrected 58% - PFT's  07/01/2017  FEV1 1.71 (77 % ) ratio 62  p 6 % improvement from saba p only flovent 110 prior to study with DLCO  60 % corrects to 72  % for alv volume  - 06/01/20  Change to trelegy  > preferred symbicort 160 - .08/06/2021 change to breztri 2 in am and prn the pm doses due to mouth irritation  Due to ongoing smoking suspect she's evolving more copd/AB pattern now and appears to be  Group D (now reclassified as E) in terms of symptom/risk and laba/lama/ICS  therefore appropriate rx at this point >>>  breztri trial as above and return with pfts and with all meds in hand using a trust but verify approach to confirm accurate Medication  Reconciliation The principal here is that until we are certain that the  patients are doing what we've asked, it makes no sense to ask them to do more.

## 2021-08-07 NOTE — Assessment & Plan Note (Signed)
Counseled re importance of smoking cessation but did not meet time criteria for separate billing          Each maintenance medication was reviewed in detail including emphasizing most importantly the difference between maintenance and prns and under what circumstances the prns are to be triggered using an action plan format where appropriate.  Total time for H and P, chart review, counseling, reviewing hfa  device(s) and generating customized AVS unique to this office visit / same day charting = 22 min       

## 2021-08-08 ENCOUNTER — Other Ambulatory Visit: Payer: Self-pay | Admitting: Internal Medicine

## 2021-08-08 ENCOUNTER — Telehealth: Payer: Self-pay | Admitting: Internal Medicine

## 2021-08-08 DIAGNOSIS — I480 Paroxysmal atrial fibrillation: Secondary | ICD-10-CM

## 2021-08-08 MED ORDER — BREZTRI AEROSPHERE 160-9-4.8 MCG/ACT IN AERO: 2.0000 | INHALATION_SPRAY | Freq: Two times a day (BID) | RESPIRATORY_TRACT | 11 refills | Status: DC

## 2021-08-08 NOTE — Telephone Encounter (Signed)
Rx for pt's Elizabeth Mcconnell has been sent to preferred pharmacy. Nothing further needed.

## 2021-08-08 NOTE — Telephone Encounter (Signed)
Eliquis '5mg'$  refill request received. Patient is 76 years old, weight-67.1kg, Crea-1.08 on 06/30/2021, Diagnosis-Afib, and last seen by Roderic Palau on 02/07/2021. Dose is appropriate based on dosing criteria. Will send in refill to requested pharmacy.

## 2021-08-13 DIAGNOSIS — M5481 Occipital neuralgia: Secondary | ICD-10-CM | POA: Diagnosis not present

## 2021-08-15 ENCOUNTER — Encounter (HOSPITAL_COMMUNITY): Payer: Self-pay | Admitting: Nurse Practitioner

## 2021-08-15 ENCOUNTER — Ambulatory Visit (HOSPITAL_COMMUNITY)
Admission: RE | Admit: 2021-08-15 | Discharge: 2021-08-15 | Disposition: A | Discharge status: Home / Self Care | Payer: Medicare PPO | Source: Ambulatory Visit | Attending: Nurse Practitioner | Admitting: Nurse Practitioner

## 2021-08-15 VITALS — BP 142/76 | HR 65 | Ht 65.0 in | Wt 148.0 lb

## 2021-08-15 DIAGNOSIS — I4892 Unspecified atrial flutter: Secondary | ICD-10-CM | POA: Diagnosis not present

## 2021-08-15 DIAGNOSIS — I480 Paroxysmal atrial fibrillation: Secondary | ICD-10-CM

## 2021-08-15 DIAGNOSIS — I10 Essential (primary) hypertension: Secondary | ICD-10-CM | POA: Insufficient documentation

## 2021-08-15 DIAGNOSIS — Z7901 Long term (current) use of anticoagulants: Secondary | ICD-10-CM | POA: Insufficient documentation

## 2021-08-15 DIAGNOSIS — R200 Anesthesia of skin: Secondary | ICD-10-CM | POA: Insufficient documentation

## 2021-08-15 DIAGNOSIS — D6869 Other thrombophilia: Secondary | ICD-10-CM | POA: Diagnosis not present

## 2021-08-15 DIAGNOSIS — F419 Anxiety disorder, unspecified: Secondary | ICD-10-CM | POA: Diagnosis not present

## 2021-08-15 LAB — MAGNESIUM: Magnesium: 2.3 mg/dL (ref 1.7–2.4)

## 2021-08-15 LAB — BASIC METABOLIC PANEL
Anion gap: 9 (ref 5–15)
BUN: 9 mg/dL (ref 8–23)
CO2: 26 mmol/L (ref 22–32)
Calcium: 10.2 mg/dL (ref 8.9–10.3)
Chloride: 103 mmol/L (ref 98–111)
Creatinine, Ser: 1 mg/dL (ref 0.44–1.00)
GFR, Estimated: 58 mL/min — ABNORMAL LOW (ref >60)
Glucose, Bld: 89 mg/dL (ref 70–99)
Potassium: 4.4 mmol/L (ref 3.5–5.1)
Sodium: 138 mmol/L (ref 135–145)

## 2021-08-15 NOTE — Progress Notes (Signed)
Primary Care Physician: Elizabeth Bowen, MD Referring Physician: Dr. Amador Mcconnell is a 76 y.o. female with a h/o  Prior h/o atrial flutter ablation as well atrial flutter ablation, maintaining SR on Tikosyn. She is in the afib clinic for Tikosyn surveillance. She has not noted any afib. She has had issues with BP and recent PCP visit showed elevation of BP. Her PCP instructed  to go up to losartan 100 mg qd from 25 mg daily as well as add Bystolic for elevation of HR in the office that day.. These changes concerned her so she did not start Bystolic and only increased losartan to 50 mg daily. She went to her drugstore and  BP was elevated to 170/80 and pharmacist encouraged her to start Bystolic. Today on both drugs her BP is 139/88. She does drink 2 glass of wine a night. No bleeding issues with eliquis.   F/u in afib clinic, 12/27/19. She is in SR today. She has not noted any afib. BP has been well managed on current meds. Mo change in health. Remains on Tikosyn and eliquis with a CHA2DS2VASc score of 4.   F/u in afib clinic, 02/07/21.  Ekg shows SR.  She remains on Tikosyn. She feels well. No afib noted.   In afib  clinic for Tikosyn surveillance, 08/14/21. She reports enjoying SR. EKG today shows SR with stable qt. She has been having issues with numbness of her rt face and has been to several specialists to get answers. She could not tolerate gabapentin/Cymbalta. She is waiting now to see if the MD at [facility] thinks she needs a vascular referral as the nerve block he recently tried did not relieve her symptoms.  Today, she denies symptoms of palpitations, chest pain, shortness of breath, orthopnea, PND, lower extremity edema, dizziness, presyncope, syncope, or neurologic sequela. The patient is tolerating medications without difficulties and is otherwise without complaint today.   Past Medical History:  Diagnosis Date   Adenomatous polyp of colon 10/2013   f/u colo 10/2018    Allergic rhinitis    ALLERGIC RHINITIS    ANXIETY    "situational" (02/14/2015)   Arthritis    "neck, back, hands" (02/14/2015)   Asthma    Atrial fibrillation with RVR ([place])    Atrial flutter ([place])    failed cardioversion 11/2012; s/p ablation 02-02-2013 by Dr Elizabeth Mcconnell   Chronic bronchitis ([place])    "I'll have it most years" (02/14/2015)   Diverticulosis    Endometriosis    Essential hypertension 05/30/2014   Hepatic steatosis    Oral candidiasis    Osteopenia 08/2015   T score -1.9 FRAX 16% / 6.5%   Pneumothorax on left 1996   left lower lung collapse s/p resection   Small bowel obstruction ([place])    Tobacco abuse    Past Surgical History:  Procedure Laterality Date   ABDOMINAL HYSTERECTOMY     APPENDECTOMY  1978   ATRIAL FIBRILLATION ABLATION N/A 11/05/2016   Procedure: Atrial Fibrillation Ablation;  Surgeon: Thompson Grayer, MD;  Location: [facility];  Service: Cardiovascular;  Laterality: N/A;   ATRIAL FLUTTER ABLATION N/A 02/02/2013   Procedure: ATRIAL FLUTTER ABLATION;  Surgeon: Coralyn Mark, MD;  Location: [facility];  Service: Cardiovascular;  Laterality: N/A;   CARDIOVERSION N/A 11/27/2012   Procedure: CARDIOVERSION;  Surgeon: Thayer Headings, MD;  Location: [facility];  Service: Cardiovascular;  Laterality: N/A;   [place]; [place]  COLECTOMY N/A 05/04/2015   Procedure: LAPAROSCOPIC LOW ANTERIOR RESECTION LAPAROSCOPIC LYSIS OF ADHESIONS, SPLENIC FLEXURE MOBILIZATION;  Surgeon: Michael Boston, MD;  Location: [facility];  Service: General;  Laterality: N/A;   LUNG REMOVAL, PARTIAL Left 02/1965   LLL   TOTAL ABDOMINAL HYSTERECTOMY W/ BILATERAL SALPINGOOPHORECTOMY  1984   Endometriosis    Current Outpatient Medications  Medication Sig Dispense Refill   acetaminophen (TYLENOL) 650 MG CR tablet Take 650 mg by mouth at bedtime.     ALPRAZolam (XANAX) 0.25 MG tablet TAKE 1 TABLET BY MOUTH AT BEDTIME AS NEEDED FOR ANXIETY OR SLEEP 30 tablet 3    amLODipine (NORVASC) 2.5 MG tablet TAKE 1 TABLET BY MOUTH EVERY DAY 90 tablet 3   Budeson-Glycopyrrol-Formoterol (BREZTRI AEROSPHERE) 160-9-4.8 MCG/ACT AERO Inhale 2 puffs into the lungs in the morning and at bedtime. 10.7 g 11   busPIRone (BUSPAR) 10 MG tablet Take 10 mg by mouth 2 (two) times daily.     BYSTOLIC 5 MG tablet TAKE 1 TABLET BY MOUTH EVERY DAY 90 tablet 0   Calcium Carb-Cholecalciferol 600-800 MG-UNIT TABS Take 1 tablet by mouth daily.     dofetilide (TIKOSYN) 250 MCG capsule TAKE 1 CAPSULE BY MOUTH 2 TIMES DAILY 180 capsule 1   ELIQUIS 5 MG TABS tablet TAKE 1 TABLET BY MOUTH 2 TIMES DAILY 180 tablet 1   fluticasone (FLONASE) 50 MCG/ACT nasal spray USE 2 SPRAYS IN EACH NOSTRIL 2 TIMES DAILY -will last 15 DAYS 16 g 5   levalbuterol (XOPENEX HFA) 45 MCG/ACT inhaler inhale 1-2 PUFFS BY MOUTH EVERY 4 HOURS AS NEEDED 15 g 2   losartan (COZAAR) 100 MG tablet TAKE 1 TABLET BY MOUTH EVERY DAY 30 tablet 8   magnesium oxide (MAG-OX) 400 MG tablet Take 1 tablet (400 mg total) by mouth daily. 30 tablet 6   mirtazapine (REMERON) 15 MG tablet Take 15 mg by mouth at bedtime.     montelukast (SINGULAIR) 10 MG tablet TAKE 1 TABLET BY MOUTH EVERY DAY 90 tablet 3   potassium chloride (KLOR-CON) 10 MEQ tablet TAKE 2 TABLETS BY MOUTH EVERY DAY 60 tablet 0   pravastatin (PRAVACHOL) 40 MG tablet Take 1 tablet by mouth daily.     No current facility-administered medications for this encounter.    Allergies  Allergen Reactions   Tramadol Other (See Comments)    Reaction:  Dizziness and weakness    Amoxicillin-Pot Clavulanate Nausea And Vomiting and Other (See Comments)    Has patient had a PCN reaction causing immediate rash, facial/tongue/throat swelling, SOB or lightheadedness with hypotension: No Has patient had a PCN reaction causing severe rash involving mucus membranes or skin necrosis: No Has patient had a PCN reaction that required hospitalization No Has patient had a PCN reaction  occurring within the last 10 years: No If all of the above answers are "NO", then may proceed with Cephalosporin use.   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    Employer: OTHER    Employer: [employer]  Tobacco Use   Smoking status: Every Day    Packs/day: 0.25    Types: Cigarettes   Smokeless tobacco: Never   Tobacco comments:    trying to quit, 1 pack/wk as of 06/01/20  Vaping Use   Vaping Use: Never used  Substance and Sexual Activity   Alcohol use: Yes    Alcohol/week: 14.0 standard drinks of alcohol    Types: 14 Standard drinks or equivalent per week    Comment: daily wine   Drug use: No   Sexual activity: Not Currently    Birth control/protection: Surgical, Post-menopausal    Comment: HYST-1st intercourse 76 yo-Fewer than 5 partners  Other Topics Concern   Not on file  Social History Narrative   Lives with spouse;    2 children      [place] [facility]:   She is from [place]. 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 Previously has attended business school. Previously did office work and Programmer, applications at [employer]. She has also worked for [employer]. She does have a cat currently. She has indoor plants. No mold exposure.   Social Determinants of Health   Financial Resource Strain: Low Risk  (08/22/2017)   Overall Financial Resource Strain (CARDIA)    Difficulty of Paying Living Expenses: Not hard at all  Food Insecurity: No Food Insecurity (08/22/2017)   Hunger Vital Sign    Worried About Running Out of  Food in the Last Year: Never true    Ran Out of Food in the Last Year: Never true  Transportation Needs: No Transportation Needs (08/22/2017)   PRAPARE - Hydrologist (Medical): No    Lack of Transportation (Non-Medical): No  Physical Activity: Unknown (10/14/2018)   Exercise Vital Sign    Days of Exercise per Week: 3 days    Minutes of Exercise per Session: Not on file  Stress: No Stress Concern Present (10/14/2018)   [place]    Feeling of Stress : Only a little  Social Connections: Moderately Integrated (08/22/2017)   Social Connection and Isolation Panel [NHANES]    Frequency of Communication with Friends and Family: More than three times a week    Frequency of Social Gatherings with Friends and Family: More than three times a week    Attends Religious Services: More than 4 times per year    Active Member of [employer] or Organizations: Yes    Attends Archivist Meetings: More than 4 times per year    Marital Status: Widowed  Human resources officer Violence: Not on file    Family History  Problem Relation Age of Onset   Asthma Maternal Grandfather    Stroke Father 33   Hypertension Father    Heart disease Father    Diabetes Mother    Asthma Child    Colon cancer Neg Hx    Rectal cancer Neg Hx    Stomach cancer Neg Hx     ROS- All systems are reviewed and negative except as per the HPI above  Physical Exam: Vitals:   08/15/21 1453  Weight: 67.1 kg  Height: '5\' 5"'$  (1.651 m)   Wt Readings from Last 3 Encounters:  08/15/21 67.1 kg  08/06/21 67.1 kg  07/03/21 68 kg    Labs: Lab Results  Component Value Date   NA 135 06/30/2021   K 4.5 06/30/2021   CL 103 06/30/2021   CO2 20 (  L) 06/30/2021   GLUCOSE 95 06/30/2021   BUN 12 06/30/2021   CREATININE 1.08 (H) 06/30/2021   CALCIUM 9.4 06/30/2021   PHOS 2.8 05/08/2015   MG 2.3 02/07/2021   Lab Results  Component Value  Date   INR 1.06 05/09/2016   Lab Results  Component Value Date   CHOL 195 10/14/2018   HDL 75.50 10/14/2018   LDLCALC 79 10/14/2018   TRIG 200.0 (H) 10/14/2018     GEN- The patient is well appearing, alert and oriented x 3 today.   Head- normocephalic, atraumatic Eyes-  Sclera clear, conjunctiva pink Ears- hearing intact Oropharynx- clear Neck- supple, no JVP Lymph- no cervical lymphadenopathy Lungs- Clear to ausculation bilaterally, normal work of breathing Heart- Regular rate and rhythm, no murmurs, rubs or gallops, PMI not laterally displaced GI- soft, NT, ND, + BS Extremities- no clubbing, cyanosis, or edema MS- no significant deformity or atrophy Skin- no rash or lesion Psych- euthymic mood, full affect Neuro- strength and sensation are intact  EKG- Vent. rate 65 BPM PR interval 188 ms QRS duration 94 ms QT/QTcB 426/443 ms P-R-T axes 75 -31 45 Normal sinus rhythm Left axis deviation Low voltage QRS Incomplete right bundle branch block Inferior infarct , age undetermined Abnormal ECG No significant change since last tracing Confirmed by Skeet Latch [phone]) on 08/15/2021 3:46:09 PM    Assessment and Plan: 1. Paroxysmal  afib  Currently quiet Continue dofetilide 250 mcg bid  Continue Bystolic 5 mg daily   Continue eliquis 5 mg bid for CHA2DS2VAS score of at least 4 Alcohol intake recommended is not more than 2x a week   Mag/bmet today   2. HTN Stable   3. Rt face numbness ongoing since January Had a CT of head and neck in January  Very frustrated that she cannot find etiology with several specialist at a standstill as what to do next  Expecting a f/u call anytime from  MD at [facility]   4. Anxiety Feels this is under better control with Buspar    Pt would like to see Dr Elizabeth Mcconnell next Will request appointment in 3 moths   Elizabeth Mcconnell, [facility] [address] [place], [place] [address] [phone]

## 2021-08-24 DIAGNOSIS — F4323 Adjustment disorder with mixed anxiety and depressed mood: Secondary | ICD-10-CM | POA: Diagnosis not present

## 2021-08-24 DIAGNOSIS — F410 Panic disorder [episodic paroxysmal anxiety] without agoraphobia: Secondary | ICD-10-CM | POA: Diagnosis not present

## 2021-08-28 DIAGNOSIS — H25011 Cortical age-related cataract, right eye: Secondary | ICD-10-CM | POA: Diagnosis not present

## 2021-08-28 DIAGNOSIS — H25811 Combined forms of age-related cataract, right eye: Secondary | ICD-10-CM | POA: Diagnosis not present

## 2021-08-28 DIAGNOSIS — H2511 Age-related nuclear cataract, right eye: Secondary | ICD-10-CM | POA: Diagnosis not present

## 2021-08-28 DIAGNOSIS — H269 Unspecified cataract: Secondary | ICD-10-CM | POA: Diagnosis not present

## 2021-08-29 ENCOUNTER — Ambulatory Visit: Payer: Medicare PPO | Admitting: Neurology

## 2021-09-11 DIAGNOSIS — H25812 Combined forms of age-related cataract, left eye: Secondary | ICD-10-CM | POA: Diagnosis not present

## 2021-09-11 DIAGNOSIS — H269 Unspecified cataract: Secondary | ICD-10-CM | POA: Diagnosis not present

## 2021-09-11 DIAGNOSIS — H2512 Age-related nuclear cataract, left eye: Secondary | ICD-10-CM | POA: Diagnosis not present

## 2021-09-11 DIAGNOSIS — H25012 Cortical age-related cataract, left eye: Secondary | ICD-10-CM | POA: Diagnosis not present

## 2021-09-24 ENCOUNTER — Encounter: Payer: Self-pay | Admitting: Internal Medicine

## 2021-09-24 ENCOUNTER — Ambulatory Visit: Payer: Medicare PPO | Admitting: Internal Medicine

## 2021-09-24 VITALS — BP 116/68 | HR 76 | Temp 98.1°F | Ht 65.0 in | Wt 150.0 lb

## 2021-09-24 DIAGNOSIS — F17210 Nicotine dependence, cigarettes, uncomplicated: Secondary | ICD-10-CM

## 2021-09-24 DIAGNOSIS — J4540 Moderate persistent asthma, uncomplicated: Secondary | ICD-10-CM | POA: Diagnosis not present

## 2021-09-24 NOTE — Patient Instructions (Signed)
Plan A = Automatic = Always=    Breztri Take 2 puffs first thing in am and then another 2 puffs about 12 hours later.    Work Product manager on inhaler technique:  relax and gently blow all the way out then take a nice smooth full deep breath back in, triggering the inhaler at same time you start breathing in.  Hold for at  least 5 seconds if you can. Blow out thru nose. Rinse and gargle with water when done.  If mouth or throat bother you at all,  try brushing teeth/gums/tongue with arm and hammer toothpaste/ make a slurry and gargle and spit out.    Plan B = Backup (to supplement plan A, not to replace it) Only use your albuterol inhaler as a rescue medication to be used if you can't catch your breath by resting or doing a relaxed purse lip breathing pattern.  - The less you use it, the better it will work when you need it. - Ok to use the inhaler up to 2 puffs  every 4 hours if you must but call for appointment if use goes up over your usual need - Don't leave home without it !!  (think of it like the spare tire for your car)     Ok to try albuterol 15 min before an activity (on alternating days)  that you know would usually make you short of breath and see if it makes any difference and if makes none then don't take albuterol after activity unless you can't catch your breath as this means it's the resting that helps, not the albuterol.   If still having night time wheeze try adding Pepcid 20 mg after supper or bedtime until return.  Please schedule a follow up visit in 3 months but call sooner if needed  Add: LDSCT rec

## 2021-09-24 NOTE — Progress Notes (Unsigned)
Subjective:   Patient ID: Elizabeth Mcconnell, female    DOB: [birth year]    MRN: [id number]  Brief patient profile:  37  yowf active smoker with "asthma all her life" previous pt of Dr Joya Gaskins and Ashok Cordia with moderate asthma criteria vs copd GOLD II as of pfts 07/01/2017       History of Present Illness  03/25/2016 acute extended ov/Andilynn Delavega re:  Chronic asthma maint flovent hfa ? Still smoking ?  Chief Complaint  Patient presents with   Acute Visit    Pt c/o chest and sinus congestion for the past 2 wks. She feels very tired and states that she has chills "all the time"- no fever. She has had a prod cough with grey sputum.    baseline doing fine on  flovent hfa / singulair not needing any rescue with little flare of cough attributes to  Uri dec 2017 took doxy and all better until 2 weeks prior to OV with cough thick grey mucus x 2 weeks worse in ams with lots of sinus congestion and grey mucus, some chills but no fever / sweats - even with flare not using xopenex and not sure how much she can "maybe 2 pffs a day"  rec Doxycycline 100 mg twice daily x 10 days Prednisone 10 mg take  4 each am x 2 days,   2 each am x 2 days,  1 each am x 2 days and stop  Work on inhaler technique: For cough / congestion mucinex 1200 mg every 12 hours as needed and use the xopenex hfa more regularly to improve the mucus flow     01/16/2021  f/u ov/Aralynn Brake re: mod asthma vs copd  GOLD 2  maint on symbicort one bid   Chief Complaint  Patient presents with   Follow-up    Breathing is overall doing well and no new co's. She rarely uses her xopenex inhaler.    Dyspnea: water aerobics twice weekly  Cough: none  Sleeping: flat bed one pillow / no resp cc  SABA use: very rarely  02: none  Covid status:   vax x ? 5  includes the bivalent  Rec Suggested e-cigs as an optional  "one way bridge"  Off all tobacco products No change in your medications    08/06/2021  f/u ov/Danaysha Kirn re: AB  maint on symbicort 160 1 bid    Chief Complaint  Patient presents with   Follow-up    She has had increased anxiety and feels this has caused her to feel more SOB. She has occ cough with white to light yellow sputum. She is using at least once per day.   Dyspnea:  no longer doing anything aerobic / steps are her only problem with doe  Cough: worse during the day, sporadic, min productive  Sleeping: no resp cc  SABA use: occ daytime 02: none Rec Plan A = Automatic = Always=    Breztri  2 puffs in am and  1-2 puffs in pm if needed Work on inhaler technique:   Plan B = Backup (to supplement plan A, not to replace it) Only use your albuterol inhaler as a rescue medication Please schedule a follow up office visit in 6 weeks, call sooner if needed with pfts after your morning breztri   09/24/2021  f/u ov/Donevin Sainsbury re: AB   maint on breztri/ quit smoking x 2 days prior to [place]   Chief Complaint  Patient presents with   Follow-up  Pt states she is about the same since LOV. Pt trying to get use to the [facility] inhaler.   Dyspnea:  no change = MMRC2 = can't walk a nl pace on a flat grade s sob but does fine slow and flat  Cough: smokers  Sleeping: no resp cc  SABA use: none today  02: none  Covid status:   vax all    No obvious day to day or daytime variability or assoc excess/ purulent sputum or mucus plugs or hemoptysis or cp or chest tightness, subjective wheeze or overt sinus or hb symptoms.   Sleeping  without nocturnal  or early am exacerbation  of respiratory  c/o's or need for noct saba. Also denies any obvious fluctuation of symptoms with weather or environmental changes or other aggravating or alleviating factors except as outlined above   No unusual exposure hx or h/o childhood pna  or knowledge of premature birth.  Current Allergies, Complete Past Medical History, Past Surgical History, Family History, and Social History were reviewed in [employer] record.  ROS  The following are not  active complaints unless bolded Hoarseness, sore throat, dysphagia, dental problems, itching, sneezing,  nasal congestion or discharge of excess mucus or purulent secretions, ear ache,   fever, chills, sweats, unintended wt loss or wt gain, classically pleuritic or exertional cp,  orthopnea pnd or arm/hand swelling  or leg swelling, presyncope, palpitations, abdominal pain, anorexia, nausea, vomiting, diarrhea  or change in bowel habits or change in bladder habits, change in stools or change in urine, dysuria, hematuria,  rash, arthralgias, visual complaints, headache, numbness, weakness or ataxia or problems with walking or coordination,  change in mood or  memory.        Current Meds  Medication Sig   acetaminophen (TYLENOL) 650 MG CR tablet Take 650 mg by mouth at bedtime.   ALPRAZolam (XANAX) 0.25 MG tablet TAKE 1 TABLET BY MOUTH AT BEDTIME AS NEEDED FOR ANXIETY OR SLEEP   amLODipine (NORVASC) 2.5 MG tablet TAKE 1 TABLET BY MOUTH EVERY DAY   Budeson-Glycopyrrol-Formoterol (BREZTRI AEROSPHERE) 160-9-4.8 MCG/ACT AERO Inhale 2 puffs into the lungs in the morning and at bedtime.   busPIRone (BUSPAR) 10 MG tablet Take 10 mg by mouth 2 (two) times daily.   BYSTOLIC 5 MG tablet TAKE 1 TABLET BY MOUTH EVERY DAY   Calcium Carb-Cholecalciferol 600-800 MG-UNIT TABS Take 1 tablet by mouth daily.   dofetilide (TIKOSYN) 250 MCG capsule TAKE 1 CAPSULE BY MOUTH 2 TIMES DAILY   ELIQUIS 5 MG TABS tablet TAKE 1 TABLET BY MOUTH 2 TIMES DAILY   fluticasone (FLONASE) 50 MCG/ACT nasal spray USE 2 SPRAYS IN EACH NOSTRIL 2 TIMES DAILY -will last 15 DAYS   levalbuterol (XOPENEX HFA) 45 MCG/ACT inhaler inhale 1-2 PUFFS BY MOUTH EVERY 4 HOURS AS NEEDED   losartan (COZAAR) 100 MG tablet TAKE 1 TABLET BY MOUTH EVERY DAY   magnesium oxide (MAG-OX) 400 MG tablet Take 1 tablet (400 mg total) by mouth daily.   mirtazapine (REMERON) 15 MG tablet Take 15 mg by mouth at bedtime.   montelukast (SINGULAIR) 10 MG tablet TAKE 1  TABLET BY MOUTH EVERY DAY   potassium chloride (KLOR-CON) 10 MEQ tablet TAKE 2 TABLETS BY MOUTH EVERY DAY   pravastatin (PRAVACHOL) 40 MG tablet Take 1 tablet by mouth daily.                Objective:   Physical Exam  09/24/2021  150  08/06/2021           148  01/16/2021       152   07/24/2020         159  10/22/2019       153 09/16/2018        151 07/01/2017        140  02/19/2017      147   03/25/16 156 lb 12.8 oz (71.1 kg)  02/12/16 155 lb 6.4 oz (70.5 kg)  11/28/15 150 lb 12 oz (68.4 kg)     Vital signs reviewed  09/24/2021  - Note at rest 02 sats  96% on RA   General appearance:    amb wf nad    HEENT : Oropharynx  clear   Nasal turbinates nl    NECK :  without  apparent JVD/ palpable Nodes/TM    LUNGS: no acc muscle use,  Min barrel  contour chest wall with bilateral  slightly decreased bs s audible wheeze and  without cough on insp or exp maneuvers and min  Hyperresonant  to  percussion bilaterally    CV:  RRR  no s3 or murmur or increase in P2, and no edema   ABD:  soft and nontender with pos end  insp Hoover's  in the supine position.  No bruits or organomegaly appreciated   MS:  Nl gait/ ext warm without deformities Or obvious joint restrictions  calf tenderness, cyanosis or clubbing     SKIN: warm and dry without lesions    NEURO:  alert, approp, nl sensorium with  no motor or cerebellar deficits apparent.           Assessment & Plan:

## 2021-09-25 ENCOUNTER — Encounter: Payer: Self-pay | Admitting: Internal Medicine

## 2021-09-25 ENCOUNTER — Telehealth: Payer: Self-pay | Admitting: Internal Medicine

## 2021-09-25 DIAGNOSIS — F17210 Nicotine dependence, cigarettes, uncomplicated: Secondary | ICD-10-CM

## 2021-09-25 NOTE — Assessment & Plan Note (Signed)
Counseled re importance of maintaining smoking cessation but did not meet time criteria for separate billing    Will rec LDSCT since still eligible until age 76   Each maintenance medication was reviewed in detail including emphasizing most importantly the difference between maintenance and prns and under what circumstances the prns are to be triggered using an action plan format where appropriate.  Total time for H and P, chart review, counseling, reviewing hfa  device(s) and generating customized AVS unique to this office visit / same day charting = 22 min

## 2021-09-25 NOTE — Telephone Encounter (Signed)
After chart review I rec she do low dose CT screening unless already done elsewhere - she is eligible for free study until age 76  - refer to [place] for shared decision making if she agrees or we can discuss further at next ov

## 2021-09-25 NOTE — Assessment & Plan Note (Addendum)
Onset "all her life"/ active smoker  Spirometry  09/25/15 FEV1 1.58  (70%)  Ratio 55 on flovent 110 2bid  PFT  09/25/15:  FEV1 1.58 L (70%) FEV1/FVC 0.55   73% DLCO uncorrected 58% - PFT's  07/01/2017  FEV1 1.71 (77 % ) ratio 62  p 6 % improvement from saba p only flovent 110 prior to study with DLCO  60 % corrects to 72  % for alv volume  - 06/01/20  Change to trelegy  > preferred symbicort 160 - .08/06/2021 change to breztri 2 in am and prn the pm doses due to mouth irritation -.09/24/2021  After extensive coaching inhaler device,  effectiveness =   80%   rx continue breztri    Group D (now reclassified as E) in terms of symptom/risk and laba/lama/ICS  therefore appropriate rx at this point >>> breztri and approp saba   Re SABA :  I spent extra time with pt today reviewing appropriate use of albuterol for prn use on exertion with the following points: 1) saba is for relief of sob that does not improve by walking a slower pace or resting but rather if the pt does not improve after trying this first. 2) If the pt is convinced, as many are, that saba helps recover from activity faster then it's easy to tell if this is the case by re-challenging : ie stop, take the inhaler, then p 5 minutes try the exact same activity (intensity of workload) that just caused the symptoms and see if they are substantially diminished or not after saba 3) if there is an activity that reproducibly causes the symptoms, try the saba 15 min before the activity on alternate days   If in fact the saba really does help, then fine to continue to use it prn but advised may need to look closer at the maintenance regimen being used to achieve better control of airways disease with exertion.   Add >>>  some of her noct "wheeze' may be gerd so reviewed diet/ lifestyle and rec for pepcid hs x one month trial

## 2021-09-26 ENCOUNTER — Other Ambulatory Visit: Payer: Self-pay | Admitting: *Deleted

## 2021-09-26 DIAGNOSIS — I6523 Occlusion and stenosis of bilateral carotid arteries: Secondary | ICD-10-CM

## 2021-09-28 NOTE — Telephone Encounter (Signed)
Called the pt and there was no answer- LMTCB    

## 2021-09-28 NOTE — Telephone Encounter (Signed)
Spoke with the pt  She is agreeable to screening program and referral was placed

## 2021-10-10 ENCOUNTER — Encounter: Payer: Medicare PPO | Admitting: Vascular Surgery

## 2021-10-15 ENCOUNTER — Other Ambulatory Visit: Payer: Self-pay | Admitting: Internal Medicine

## 2021-10-17 ENCOUNTER — Encounter: Payer: Self-pay | Admitting: Vascular Surgery

## 2021-10-17 ENCOUNTER — Ambulatory Visit: Payer: Medicare PPO | Admitting: Vascular Surgery

## 2021-10-17 ENCOUNTER — Ambulatory Visit (HOSPITAL_COMMUNITY)
Admission: RE | Admit: 2021-10-17 | Discharge: 2021-10-17 | Disposition: A | Discharge status: Home / Self Care | Payer: Medicare PPO | Source: Ambulatory Visit | Attending: Vascular Surgery | Admitting: Vascular Surgery

## 2021-10-17 VITALS — BP 153/86 | HR 57 | Temp 98.0°F | Resp 20 | Ht 65.0 in | Wt 149.9 lb

## 2021-10-17 DIAGNOSIS — I6523 Occlusion and stenosis of bilateral carotid arteries: Secondary | ICD-10-CM

## 2021-10-17 DIAGNOSIS — M5481 Occipital neuralgia: Secondary | ICD-10-CM | POA: Diagnosis not present

## 2021-10-17 NOTE — Progress Notes (Signed)
Patient ID: Elizabeth Mcconnell, female   DOB: January 05, 1946, 76 y.o.   MRN: [id number]  Reason for Consult: New Patient (Initial Visit)   Referred by Whitfield Cellar, MD  Subjective:     HPI:  Elizabeth Mcconnell is a 76 y.o. female without significant vascular history.  She does have atrial fibrillation for which she takes Eliquis and she is also on a statin drug.  She denies any history of stroke, TIA or amaurosis.  No personal or family history of aneurysm disease.  She walks without limitation.  Recent underwent CT scan for in January which was for a vertigo attack that she had at the beginning of this year while at the [facility].  She has had subsequent issues with dizziness and also a tingling of the right side of her face when she turns to the left.  She has been evaluated by neurosurgery, neurology and by [facility] and has also seen a chiropractor.  She is now here with carotid duplex.  She is concerned because her symptoms are occurring nearly all the time when she turns her head to the left and quite troublesome for her.  Past Medical History:  Diagnosis Date   Adenomatous polyp of colon 10/2013   f/u colo 10/2018   Allergic rhinitis    ALLERGIC RHINITIS    ANXIETY    "situational" (02/14/2015)   Arthritis    "neck, back, hands" (02/14/2015)   Asthma    Atrial fibrillation with RVR (HCC)    Atrial flutter ([place])    failed cardioversion 11/2012; s/p ablation 02-02-2013 by Dr Rayann Heman   Chronic bronchitis ([place])    "I'll have it most years" (02/14/2015)   Diverticulosis    Endometriosis    Essential hypertension 05/30/2014   Hepatic steatosis    Oral candidiasis    Osteopenia 08/2015   T score -1.9 FRAX 16% / 6.5%   Pneumothorax on left 1996   left lower lung collapse s/p resection   Small bowel obstruction (HCC)    Tobacco abuse    Family History  Problem Relation Age of Onset   Asthma Maternal Grandfather    Stroke Father 74   Hypertension Father     Heart disease Father    Diabetes Mother    Asthma Child    Colon cancer Neg Hx    Rectal cancer Neg Hx    Stomach cancer Neg Hx    Past Surgical History:  Procedure Laterality Date   ABDOMINAL HYSTERECTOMY     APPENDECTOMY  1978   ATRIAL FIBRILLATION ABLATION N/A 11/05/2016   Procedure: Atrial Fibrillation Ablation;  Surgeon: Thompson Grayer, MD;  Location: [place] CV LAB;  Service: Cardiovascular;  Laterality: N/A;   ATRIAL FLUTTER ABLATION N/A 02/02/2013   Procedure: ATRIAL FLUTTER ABLATION;  Surgeon: Coralyn Mark, MD;  Location: [place] CATH LAB;  Service: Cardiovascular;  Laterality: N/A;   CARDIOVERSION N/A 11/27/2012   Procedure: CARDIOVERSION;  Surgeon: Thayer Headings, MD;  Location: [facility] ENDOSCOPY;  Service: Cardiovascular;  Laterality: N/A;   [place]; [place] N/A 05/04/2015   Procedure: LAPAROSCOPIC LOW ANTERIOR RESECTION LAPAROSCOPIC LYSIS OF ADHESIONS, SPLENIC FLEXURE MOBILIZATION;  Surgeon: Michael Boston, MD;  Location: [facility] ORS;  Service: General;  Laterality: N/A;   LUNG REMOVAL, PARTIAL Left 02/1965   LLL   TOTAL ABDOMINAL HYSTERECTOMY W/ BILATERAL SALPINGOOPHORECTOMY  1984   Endometriosis    Short Social History:  Social History   Tobacco  Use   Smoking status: Former    Packs/day: 0.25    Types: Cigarettes    Quit date: 09/22/2021    Years since quitting: 0.0   Smokeless tobacco: Never   Tobacco comments:    Trying to quit and stopped 2 days ago on 09/22/21. LW  Substance Use Topics   Alcohol use: Yes    Alcohol/week: 14.0 standard drinks of alcohol    Types: 14 Standard drinks or equivalent per week    Comment: daily wine    Allergies  Allergen Reactions   Tramadol Other (See Comments)    Reaction:  Dizziness and weakness    Amoxicillin-Pot Clavulanate Nausea And Vomiting and Other (See Comments)    Has patient had a PCN reaction causing immediate rash, facial/tongue/throat swelling, SOB or lightheadedness with  hypotension: No Has patient had a PCN reaction causing severe rash involving mucus membranes or skin necrosis: No Has patient had a PCN reaction that required hospitalization No Has patient had a PCN reaction occurring within the last 10 years: No If all of the above answers are "NO", then may proceed with Cephalosporin use.   Amoxicillin Other (See Comments)   Flagyl [Metronidazole] Nausea And Vomiting   Flecainide Other (See Comments)    Dizziness    Gabapentin     Shaking all over, fingers were numb and trouble breathing   Omnicef [Cefdinir] Nausea And Vomiting   Avelox [Moxifloxacin] Hives and Itching    Current Outpatient Medications  Medication Sig Dispense Refill   acetaminophen (TYLENOL) 650 MG CR tablet Take 650 mg by mouth at bedtime.     ALPRAZolam (XANAX) 0.25 MG tablet TAKE 1 TABLET BY MOUTH AT BEDTIME AS NEEDED FOR ANXIETY OR SLEEP 30 tablet 3   amLODipine (NORVASC) 2.5 MG tablet TAKE 1 TABLET BY MOUTH EVERY DAY 90 tablet 3   Budeson-Glycopyrrol-Formoterol (BREZTRI AEROSPHERE) 160-9-4.8 MCG/ACT AERO Inhale 2 puffs into the lungs in the morning and at bedtime. 10.7 g 11   busPIRone (BUSPAR) 10 MG tablet Take 10 mg by mouth 2 (two) times daily.     BYSTOLIC 5 MG tablet TAKE 1 TABLET BY MOUTH EVERY DAY 90 tablet 0   Calcium Carb-Cholecalciferol 600-800 MG-UNIT TABS Take 1 tablet by mouth daily.     dofetilide (TIKOSYN) 250 MCG capsule TAKE 1 CAPSULE BY MOUTH 2 TIMES DAILY 180 capsule 1   ELIQUIS 5 MG TABS tablet TAKE 1 TABLET BY MOUTH 2 TIMES DAILY 180 tablet 1   fluticasone (FLONASE) 50 MCG/ACT nasal spray USE 2 SPRAYS IN EACH NOSTRIL 2 TIMES DAILY -will last 15 DAYS 16 g 5   levalbuterol (XOPENEX HFA) 45 MCG/ACT inhaler inhale 1-2 PUFFS INTO THE LUNGS EVERY 4 HOURS AS NEEDED 15 g 2   losartan (COZAAR) 100 MG tablet TAKE 1 TABLET BY MOUTH EVERY DAY 30 tablet 8   magnesium oxide (MAG-OX) 400 MG tablet Take 1 tablet (400 mg total) by mouth daily. 30 tablet 6   mirtazapine  (REMERON) 15 MG tablet Take 15 mg by mouth at bedtime.     montelukast (SINGULAIR) 10 MG tablet TAKE 1 TABLET BY MOUTH EVERY DAY 90 tablet 3   potassium chloride (KLOR-CON) 10 MEQ tablet TAKE 2 TABLETS BY MOUTH EVERY DAY 60 tablet 0   pravastatin (PRAVACHOL) 40 MG tablet Take 1 tablet by mouth daily.     No current facility-administered medications for this visit.    Review of Systems  Constitutional:  Constitutional negative. HENT: HENT negative.  Eyes: Eyes  negative.  Respiratory: Respiratory negative.  Cardiovascular: Cardiovascular negative.  GI: Gastrointestinal negative.  Musculoskeletal: Musculoskeletal negative.  Skin: Skin negative.  Neurological: Positive for dizziness.       Feels like fingers on the right side of her scalp when she turns her head left Hematologic: Hematologic/lymphatic negative.  Psychiatric: Psychiatric negative.        Objective:  Objective   Vitals:   10/17/21 0829 10/17/21 0831  BP: (!) 154/80 (!) 153/86  Pulse: (!) 57   Resp: 20   Temp: 98 F (36.7 C)   SpO2: 95%      Physical Exam HENT:     Head: Normocephalic.     Nose: Nose normal.  Eyes:     Pupils: Pupils are equal, round, and reactive to light.  Neck:     Vascular: No carotid bruit.  Cardiovascular:     Rate and Rhythm: Normal rate.     Pulses:          Radial pulses are 2+ on the right side and 2+ on the left side.       Popliteal pulses are 2+ on the right side and 2+ on the left side.  Pulmonary:     Effort: Pulmonary effort is normal.  Abdominal:     General: Abdomen is flat.     Palpations: Abdomen is soft.  Musculoskeletal:     Cervical back: Normal range of motion.     Right lower leg: No edema.     Left lower leg: No edema.  Skin:    General: Skin is warm and dry.     Capillary Refill: Capillary refill takes less than 2 seconds.  Neurological:     General: No focal deficit present.     Mental Status: She is alert.  Psychiatric:        Mood and Affect:  Mood normal.        Behavior: Behavior normal.        Thought Content: Thought content normal.        Judgment: Judgment normal.     Data: Right Carotid Findings:  +----------+--------+--------+--------+------------------+--------+            PSV cm/sEDV cm/sStenosisPlaque DescriptionComments  +----------+--------+--------+--------+------------------+--------+  CCA Prox  88      12                                          +----------+--------+--------+--------+------------------+--------+  CCA Mid   50      9                                           +----------+--------+--------+--------+------------------+--------+  CCA Distal48      11                                          +----------+--------+--------+--------+------------------+--------+  ICA Prox  59      17      1-39%   heterogenous                +----------+--------+--------+--------+------------------+--------+  ICA Mid   63      20                                          +----------+--------+--------+--------+------------------+--------+  ICA Distal76      23                                          +----------+--------+--------+--------+------------------+--------+  ECA       66      2                                           +----------+--------+--------+--------+------------------+--------+   +----------+--------+-------+----------------+-------------------+            PSV cm/sEDV cmsDescribe        Arm Pressure (mmHG)  +----------+--------+-------+----------------+-------------------+  Subclavian117     4      Multiphasic, WNL                     +----------+--------+-------+----------------+-------------------+   +---------+--------+--+--------+-+---------+  VertebralPSV cm/s36EDV cm/s9Antegrade  +---------+--------+--+--------+-+---------+       Left Carotid Findings:   +----------+--------+--------+--------+------------------+--------+            PSV cm/sEDV cm/sStenosisPlaque DescriptionComments  +----------+--------+--------+--------+------------------+--------+  CCA Prox  66      14                                          +----------+--------+--------+--------+------------------+--------+  CCA Mid   55      11              homogeneous                 +----------+--------+--------+--------+------------------+--------+  CCA Distal46      11              heterogenous                +----------+--------+--------+--------+------------------+--------+  ICA Prox  32      12      1-39%   heterogenous                +----------+--------+--------+--------+------------------+--------+  ICA Mid   72      21                                          +----------+--------+--------+--------+------------------+--------+  ICA Distal119     40                                          +----------+--------+--------+--------+------------------+--------+  ECA       58      5               heterogenous                +----------+--------+--------+--------+------------------+--------+   +----------+--------+--------+----------------+-------------------+            PSV cm/sEDV cm/sDescribe        Arm Pressure (mmHG)  +----------+--------+--------+----------------+-------------------+  [id number]      4       Multiphasic, WNL                     +----------+--------+--------+----------------+-------------------+   +---------+--------+--+--------+--+---------+  VertebralPSV cm/s44EDV cm/s10Antegrade  +---------+--------+--+--------+--+---------+           Summary:  Right Carotid: Velocities in the right ICA are consistent with a 1-39%  stenosis.   Left Carotid: Velocities in the left ICA are consistent with a 1-39%  stenosis.   Vertebrals:  Bilateral vertebral arteries demonstrate  antegrade flow.  Subclavians: Normal flow hemodynamics were seen in bilateral subclavian               arteries.       CTA neck:   1. Moderate stenosis of the right vertebral artery at the C1-C2 level and the left vertebral artery at the C2-C3 level, in part due to mass effect from adjacent degenerative osteophytes. Otherwise, multilevel mild to moderate stenosis of the vertebral arteries. 2. Bilateral carotid bifurcation atherosclerosis without greater than 50% stenosis. 3. Severe multilevel degenerative change of the spine.   Assessment/Plan:    76 year old female with the above-noted symptoms with minimal stenosis of her bilateral carotid arteries by both CT scan which we reviewed together today and also confirmed with duplex.  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  Certainly I do not recommend any vascular invention for her ongoing issues and she should keep her appointment with neurology next month.  She does not need specific follow-up with me unless there are further questions or concerns.  All of her questions were answered today.     Waynetta Sandy MD [facility]

## 2021-10-22 ENCOUNTER — Other Ambulatory Visit: Payer: Self-pay | Admitting: Neurology

## 2021-10-25 DIAGNOSIS — F4323 Adjustment disorder with mixed anxiety and depressed mood: Secondary | ICD-10-CM | POA: Diagnosis not present

## 2021-10-25 DIAGNOSIS — F410 Panic disorder [episodic paroxysmal anxiety] without agoraphobia: Secondary | ICD-10-CM | POA: Diagnosis not present

## 2021-11-09 ENCOUNTER — Other Ambulatory Visit: Payer: Self-pay | Admitting: Nurse Practitioner

## 2021-11-12 NOTE — Progress Notes (Addendum)
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   Bilateral vertebral artery stenosis Pulsatile tinnitus - prior imaging revealed no intracranial vascular etiology.  She does have extra-cranial vertebral artery stenosis which may be a contributing factor.  Would check thyroid     1  As findings on cervical spine (C1-2 degenerative changes, vertebral artery stenosis), will check for intracranial etiology - MRI of brain with and without contrast and request for possibly imaging upper cervical (C1 through C3 levels) 2  Check TSH 3  Further recommendations pending results.   01/21/2022 ADDENDUM:  MRI of brain with and without contrast on 01/17/2022 revealed some chronic small vessel ischemic changes but nothing to explain her occipital symptoms.  Anxiety may be playing a role.  Otherwise, despite failing treatment, I no other explanation for her symptoms other than the findings on cervical spine.    Subjective:  Elizabeth Mcconnell is a 76 year old female with a fib, arthritis of spine and hands, HTN, and anxiety who follows up for occipital neuralgia.   UPDATE: Current medications: pregablin '50mg'$  BID  Started pregablin last visit, which has since been discontinued.  Referred her to interventional pain management.  She saw Dr. Patsy Lager at [facility].  She received 3 occipital nerve blocks which did not relieve her discomfort.  She was referred to vascular surgery.  Carotid ultrasound on 10/17/2021 showed 1-39% bilateral ICA stenosis and antegrade flow bilaterally in VAs. Did not recommend surgical intervention.     Sensation improved turning to left about 4-6 weeks ago.  When lay down to go to sleep, feels something in head - pressure or mild discomfort  in right occipital  region on right side up in morning rushing sound   HISTORY: Started on New Year's Eve.  She was watching a show at [employer].  When she turned her head to the left to look at the performers, she started experiencing pressure and tingling on the right sided of neck and head.  When she left, she felt dizzy.  No longer dizzy.  It is not as severe now, but If she turns her head to the left, she feels like a "wave of fingers" moving up the right side of her neck and right side of head.  No associated pain but it causes a great deal of anxiety.  She has significant anxiety and this has been aggravating it.  Occasional associated soreness on top of head.  CTA head and neck on 03/08/2021 personally reviewed showed severe right eccentric degenerative changes at C1-2 and moderate right vertebral artery stenosis at C1-2 level and moderate left vertebral artery stenosis at C2-3 level in part due to mass effect from the adjacent degenerative osteophytes.  Also showed severe multilevel degenerative changes with severe DDD at C5-6 and C6-7.  No pain and weakness in the right arm.  If she rests her right arm over a pillow, she may feel numbness for 30 seconds.  She saw neurosurgery.  She received who was going to do a cerebral angiogram but did not believe it was vascular.  She received an what I think was an occipital nerve block which was helpful for a day.  An ablation was recommended but she declined due to possibility of chronic numbness.  Tried sertraline for 3 days for anxiety but  stopped because she felt more nervous and shaky.     Past medications:  duloxetine (side effect), gabapentin (side effect).  Would not start carbamazepine as it may decrease efficacy of Eliquis.  PAST MEDICAL HISTORY: Past Medical History:  Diagnosis Date   Adenomatous polyp of colon 10/2013   f/u colo 10/2018   Allergic rhinitis    ALLERGIC RHINITIS    ANXIETY    "situational" (02/14/2015)   Arthritis    "neck, back, hands"  (02/14/2015)   Asthma    Atrial fibrillation with RVR ([place])    Atrial flutter ([place])    failed cardioversion 11/2012; s/p ablation 02-02-2013 by Dr Rayann Heman   Chronic bronchitis ([place])    "I'll have it most years" (02/14/2015)   Diverticulosis    Endometriosis    Essential hypertension 05/30/2014   Hepatic steatosis    Oral candidiasis    Osteopenia 08/2015   T score -1.9 FRAX 16% / 6.5%   Pneumothorax on left 1996   left lower lung collapse s/p resection   Small bowel obstruction (HCC)    Tobacco abuse     MEDICATIONS: Current Outpatient Medications on File Prior to Visit  Medication Sig Dispense Refill   acetaminophen (TYLENOL) 650 MG CR tablet Take 650 mg by mouth at bedtime.     ALPRAZolam (XANAX) 0.25 MG tablet TAKE 1 TABLET BY MOUTH AT BEDTIME AS NEEDED FOR ANXIETY OR SLEEP 30 tablet 3   amLODipine (NORVASC) 2.5 MG tablet TAKE 1 TABLET BY MOUTH EVERY DAY 90 tablet 3   Budeson-Glycopyrrol-Formoterol (BREZTRI AEROSPHERE) 160-9-4.8 MCG/ACT AERO Inhale 2 puffs into the lungs in the morning and at bedtime. 10.7 g 11   busPIRone (BUSPAR) 10 MG tablet Take 10 mg by mouth 2 (two) times daily.     BYSTOLIC 5 MG tablet TAKE 1 TABLET BY MOUTH EVERY DAY 90 tablet 0   Calcium Carb-Cholecalciferol 600-800 MG-UNIT TABS Take 1 tablet by mouth daily.     dofetilide (TIKOSYN) 250 MCG capsule TAKE 1 CAPSULE BY MOUTH 2 TIMES DAILY 180 capsule 1   ELIQUIS 5 MG TABS tablet TAKE 1 TABLET BY MOUTH 2 TIMES DAILY 180 tablet 1   fluticasone (FLONASE) 50 MCG/ACT nasal spray USE 2 SPRAYS IN EACH NOSTRIL 2 TIMES DAILY -will last 15 DAYS 16 g 5   levalbuterol (XOPENEX HFA) 45 MCG/ACT inhaler inhale 1-2 PUFFS INTO THE LUNGS EVERY 4 HOURS AS NEEDED 15 g 2   losartan (COZAAR) 100 MG tablet TAKE 1 TABLET BY MOUTH EVERY DAY 30 tablet 8   magnesium oxide (MAG-OX) 400 MG tablet Take 1 tablet (400 mg total) by mouth daily. 30 tablet 6   mirtazapine (REMERON) 15 MG tablet Take 15 mg by mouth at bedtime.     montelukast  (SINGULAIR) 10 MG tablet TAKE 1 TABLET BY MOUTH EVERY DAY 90 tablet 3   potassium chloride (KLOR-CON) 10 MEQ tablet TAKE 2 TABLETS BY MOUTH EVERY DAY 60 tablet 0   pravastatin (PRAVACHOL) 40 MG tablet Take 1 tablet by mouth daily.     No current facility-administered medications on file prior to visit.    ALLERGIES: Allergies  Allergen Reactions   Tramadol Other (See Comments)    Reaction:  Dizziness and weakness    Amoxicillin-Pot Clavulanate Nausea And Vomiting and Other (See Comments)    Has patient had a PCN reaction causing immediate rash, facial/tongue/throat swelling, SOB or lightheadedness with hypotension: No Has patient had a PCN reaction causing severe rash involving mucus membranes or skin  necrosis: No Has patient had a PCN reaction that required hospitalization No Has patient had a PCN reaction occurring within the last 10 years: No If all of the above answers are "NO", then may proceed with Cephalosporin use.   Amoxicillin Other (See Comments)   Flagyl [Metronidazole] Nausea And Vomiting   Flecainide Other (See Comments)    Dizziness    Gabapentin     Shaking all over, fingers were numb and trouble breathing   Omnicef [Cefdinir] Nausea And Vomiting   Avelox [Moxifloxacin] Hives and Itching    FAMILY HISTORY: Family History  Problem Relation Age of Onset   Asthma Maternal Grandfather    Stroke Father 67   Hypertension Father    Heart disease Father    Diabetes Mother    Asthma Child    Colon cancer Neg Hx    Rectal cancer Neg Hx    Stomach cancer Neg Hx       Objective:  Blood pressure 133/62, pulse 64, height '5\' 5"'$  (1.651 m), weight 153 lb 3.2 oz (69.5 kg), SpO2 95 %. General: No acute distress.  Patient appears well-groomed.   Head:  Normocephalic/atraumatic Eyes:  Fundi examined but not visualized Neck: supple, no paraspinal tenderness, full range of motion Heart:  Regular rate and rhythm Lungs:  Clear to auscultation bilaterally Back: No paraspinal  tenderness Neurological Exam: alert and oriented to person, place, and time.  Speech fluent and not dysarthric, language intact.  CN II-XII intact. Bulk and tone normal, muscle strength 5/5 throughout.  Sensation to light touch intact.  Deep tendon reflexes 2+ throughout.  Finger to nose testing intact.  Gait normal, Romberg negative.   Metta Clines, DO  CC: [name], MD

## 2021-11-13 ENCOUNTER — Encounter: Payer: Self-pay | Admitting: Neurology

## 2021-11-13 ENCOUNTER — Other Ambulatory Visit: Payer: Self-pay | Admitting: Internal Medicine

## 2021-11-13 ENCOUNTER — Ambulatory Visit: Payer: Medicare PPO | Admitting: Neurology

## 2021-11-13 ENCOUNTER — Other Ambulatory Visit (INDEPENDENT_AMBULATORY_CARE_PROVIDER_SITE_OTHER): Payer: Medicare PPO

## 2021-11-13 VITALS — BP 133/62 | HR 64 | Ht 65.0 in | Wt 153.2 lb

## 2021-11-13 DIAGNOSIS — M4802 Spinal stenosis, cervical region: Secondary | ICD-10-CM

## 2021-11-13 DIAGNOSIS — H93A3 Pulsatile tinnitus, bilateral: Secondary | ICD-10-CM | POA: Diagnosis not present

## 2021-11-13 DIAGNOSIS — M5481 Occipital neuralgia: Secondary | ICD-10-CM | POA: Diagnosis not present

## 2021-11-13 NOTE — Patient Instructions (Signed)
Check MRI of brain with and without contrast Check TSH Further recommendations pending results.

## 2021-11-13 NOTE — Telephone Encounter (Signed)
This is a A-Fib clinic pt 

## 2021-11-14 LAB — TSH: TSH: 0.83 u[IU]/mL (ref 0.35–5.50)

## 2021-12-01 DIAGNOSIS — R6883 Chills (without fever): Secondary | ICD-10-CM | POA: Diagnosis not present

## 2021-12-01 DIAGNOSIS — R051 Acute cough: Secondary | ICD-10-CM | POA: Diagnosis not present

## 2021-12-01 DIAGNOSIS — U071 COVID-19: Secondary | ICD-10-CM | POA: Diagnosis not present

## 2021-12-05 ENCOUNTER — Ambulatory Visit
Admission: RE | Admit: 2021-12-05 | Discharge: 2021-12-05 | Disposition: A | Discharge status: Home / Self Care | Payer: Medicare PPO | Source: Ambulatory Visit | Attending: Neurology | Admitting: Neurology

## 2021-12-05 DIAGNOSIS — M4802 Spinal stenosis, cervical region: Secondary | ICD-10-CM

## 2021-12-05 DIAGNOSIS — M5481 Occipital neuralgia: Secondary | ICD-10-CM

## 2021-12-30 NOTE — Progress Notes (Unsigned)
Subjective:   Patient ID: Elizabeth Mcconnell, female    DOB: 1945-06-23    MRN: [id number]  Brief patient profile:  54  yowf active smoker with "asthma all her life" previous pt of Dr Joya Gaskins and Ashok Cordia with moderate asthma criteria vs copd GOLD II as of pfts 07/01/2017       History of Present Illness  03/25/2016 acute extended ov/Vonnie Spagnolo re:  Chronic asthma maint flovent hfa ? Still smoking ?  Chief Complaint  Patient presents with   Acute Visit    Pt c/o chest and sinus congestion for the past 2 wks. She feels very tired and states that she has chills "all the time"- no fever. She has had a prod cough with grey sputum.    baseline doing fine on  flovent hfa / singulair not needing any rescue with little flare of cough attributes to  Uri dec 2017 took doxy and all better until 2 weeks prior to OV with cough thick grey mucus x 2 weeks worse in ams with lots of sinus congestion and grey mucus, some chills but no fever / sweats - even with flare not using xopenex and not sure how much she can "maybe 2 pffs a day"  rec Doxycycline 100 mg twice daily x 10 days Prednisone 10 mg take  4 each am x 2 days,   2 each am x 2 days,  1 each am x 2 days and stop  Work on inhaler technique: For cough / congestion mucinex 1200 mg every 12 hours as needed and use the xopenex hfa more regularly to improve the mucus flow     01/16/2021  f/u ov/Hamdi Vari re: mod asthma vs copd  GOLD 2  maint on symbicort one bid   Chief Complaint  Patient presents with   Follow-up    Breathing is overall doing well and no new co's. She rarely uses her xopenex inhaler.    Dyspnea: water aerobics twice weekly  Cough: none  Sleeping: flat bed one pillow / no resp cc  SABA use: very rarely  02: none  Covid status:   vax x ? 5  includes the bivalent  Rec Suggested e-cigs as an optional  "one way bridge"  Off all tobacco products No change in your medications    08/06/2021  f/u ov/Ruddy Swire re: AB  maint on symbicort 160 1 bid    Chief Complaint  Patient presents with   Follow-up    She has had increased anxiety and feels this has caused her to feel more SOB. She has occ cough with white to light yellow sputum. She is using at least once per day.   Dyspnea:  no longer doing anything aerobic / steps are her only problem with doe  Cough: worse during the day, sporadic, min productive  Sleeping: no resp cc  SABA use: occ daytime 02: none Rec Plan A = Automatic = Always=    Breztri  2 puffs in am and  1-2 puffs in pm if needed Work on inhaler technique:   Plan B = Backup (to supplement plan A, not to replace it) Only use your albuterol inhaler as a rescue medication Please schedule a follow up office visit in 6 weeks, call sooner if needed with pfts after your morning breztri   09/24/2021  f/u ov/Devanshi Califf re: AB  maint on breztri/ quit smoking x 2 days prior to [place]   Chief Complaint  Patient presents with   Follow-up  Pt states she is about the same since LOV. Pt trying to get use to the [facility] inhaler.   Dyspnea:  no change = MMRC2 = can't walk a nl pace on a flat grade s sob but does fine slow and flat  Cough: smokers  Sleeping: no resp cc  SABA use: none today  02: none  Covid status:   vax all  Rec Plan A = Automatic = Always=    Breztri Take 2 puffs first thing in am and then another 2 puffs about 12 hours later.  Work Product manager on inhaler technique:  Plan B = Backup (to supplement plan A, not to replace it) Only use your albuterol inhaler as a rescue medication   Ok to try albuterol 15 min before an activity (on alternating days)  that you know would usually make you short of breath  If still having night time wheeze try adding Pepcid 20 mg after supper or bedtime until return.  Add: LDSCT rec> not done as of 12/31/2021    12/31/2021  f/u ov/Alben Jepsen re: copd gold 2/ACOS   maint on breztri 2bid worse x one month/ still smoking  No chief complaint on file. Dyspnea: MMRC2 = can't walk a nl pace on a flat  grade s sob but does fine slow and flat  Cough: slt yellow esp in am x 1 m prior to [place] > malnupivir  Sleeping: no resp cc  SABA use: rarely at hs  02: none  Covid status:   vax max x for the new variant  Lung cancer screening :  advised to do it.    No obvious day to day or daytime variability or   mucus plugs or hemoptysis or cp or chest tightness, subjective wheeze or overt sinus or hb symptoms.     Also denies any obvious fluctuation of symptoms with weather or environmental changes or other aggravating or alleviating factors except as outlined above   No unusual exposure hx or h/o childhood pna or knowledge of premature birth.  Current Allergies, Complete Past Medical History, Past Surgical History, Family History, and Social History were reviewed in [employer] record.  ROS  The following are not active complaints unless bolded Hoarseness, sore throat, dysphagia, dental problems, itching, sneezing,  nasal congestion or discharge of excess mucus or purulent secretions, ear ache,   fever, chills, sweats, unintended wt loss or wt gain, classically pleuritic or exertional cp,  orthopnea pnd or arm/hand swelling  or leg swelling, presyncope, palpitations, abdominal pain, anorexia, nausea, vomiting, diarrhea  or change in bowel habits or change in bladder habits, change in stools or change in urine, dysuria, hematuria,  rash, arthralgias, visual complaints, headache, numbness, weakness or ataxia or problems with walking or coordination,  change in mood or  memory.        Current Meds  Medication Sig   acetaminophen (TYLENOL) 650 MG CR tablet Take 650 mg by mouth at bedtime.   ALPRAZolam (XANAX) 0.25 MG tablet TAKE 1 TABLET BY MOUTH AT BEDTIME AS NEEDED FOR ANXIETY OR SLEEP   amLODipine (NORVASC) 2.5 MG tablet TAKE 1 TABLET BY MOUTH EVERY DAY   Budeson-Glycopyrrol-Formoterol (BREZTRI AEROSPHERE) 160-9-4.8 MCG/ACT AERO Inhale 2 puffs into the lungs in the  morning and at bedtime.   busPIRone (BUSPAR) 10 MG tablet Take 10 mg by mouth 2 (two) times daily.   BYSTOLIC 5 MG tablet TAKE 1 TABLET BY MOUTH EVERY DAY   Calcium Carb-Cholecalciferol 600-800 MG-UNIT  TABS Take 1 tablet by mouth daily.   dofetilide (TIKOSYN) 250 MCG capsule TAKE 1 CAPSULE BY MOUTH 2 TIMES DAILY   ELIQUIS 5 MG TABS tablet TAKE 1 TABLET BY MOUTH 2 TIMES DAILY   fluticasone (FLONASE) 50 MCG/ACT nasal spray USE 2 SPRAYS IN EACH NOSTRIL 2 TIMES DAILY -will last 15 DAYS   levalbuterol (XOPENEX HFA) 45 MCG/ACT inhaler inhale 1-2 PUFFS INTO THE LUNGS EVERY 4 HOURS AS NEEDED   losartan (COZAAR) 100 MG tablet TAKE 1 TABLET BY MOUTH EVERY DAY   magnesium oxide (MAG-OX) 400 MG tablet Take 1 tablet (400 mg total) by mouth daily.   mirtazapine (REMERON) 15 MG tablet Take 15 mg by mouth at bedtime.   montelukast (SINGULAIR) 10 MG tablet TAKE 1 TABLET BY MOUTH EVERY DAY   potassium chloride (KLOR-CON) 10 MEQ tablet TAKE 2 TABLETS BY MOUTH EVERY DAY   pravastatin (PRAVACHOL) 40 MG tablet Take 1 tablet by mouth daily.                    Objective:   Physical Exam  12/31/2021        154  09/24/2021         150  08/06/2021           148  01/16/2021       152   07/24/2020         159  10/22/2019       153 09/16/2018        151 07/01/2017        140  02/19/2017      147   03/25/16 156 lb 12.8 oz (71.1 kg)  02/12/16 155 lb 6.4 oz (70.5 kg)  11/28/15 150 lb 12 oz (68.4 kg)      Vital signs reviewed  12/31/2021  - Note at rest 02 sats  96% on RA   General appearance:    pleasant amb wf/ mild rattling cough     HEENT : Oropharynx  clear   Nasal turbinates mild edema    NECK :  without  apparent JVD/ palpable Nodes/TM    LUNGS: no acc muscle use,  Min barrel  contour chest wall with bilateral  min exp rhonchi and  without cough on insp or exp maneuvers and min  Hyperresonant  to  percussion bilaterally    CV:  RRR  no s3 or murmur or increase in P2, and no edema   ABD:  soft  and nontender with pos end  insp Hoover's  in the supine position.  No bruits or organomegaly appreciated   MS:  Nl gait/ ext warm without deformities Or obvious joint restrictions  calf tenderness, cyanosis or clubbing     SKIN: warm and dry without lesions    NEURO:  alert, approp, nl sensorium with  no motor or cerebellar deficits apparent.         CXR PA and Lateral:   12/31/2021 :    I personally reviewed images and impression is as follows:     Increased airway markings c/w bronchitis changes/ no airspace dz     Assessment & Plan:

## 2021-12-31 ENCOUNTER — Ambulatory Visit: Payer: Medicare PPO | Admitting: Internal Medicine

## 2021-12-31 ENCOUNTER — Encounter: Payer: Self-pay | Admitting: Internal Medicine

## 2021-12-31 ENCOUNTER — Other Ambulatory Visit: Payer: Self-pay | Admitting: Internal Medicine

## 2021-12-31 ENCOUNTER — Ambulatory Visit (INDEPENDENT_AMBULATORY_CARE_PROVIDER_SITE_OTHER): Payer: Medicare PPO

## 2021-12-31 VITALS — BP 122/70 | HR 72 | Temp 97.9°F | Ht 65.0 in | Wt 154.0 lb

## 2021-12-31 DIAGNOSIS — Z23 Encounter for immunization: Secondary | ICD-10-CM | POA: Diagnosis not present

## 2021-12-31 DIAGNOSIS — J4540 Moderate persistent asthma, uncomplicated: Secondary | ICD-10-CM

## 2021-12-31 DIAGNOSIS — F17210 Nicotine dependence, cigarettes, uncomplicated: Secondary | ICD-10-CM

## 2021-12-31 DIAGNOSIS — J439 Emphysema, unspecified: Secondary | ICD-10-CM | POA: Diagnosis not present

## 2021-12-31 DIAGNOSIS — R059 Cough, unspecified: Secondary | ICD-10-CM | POA: Diagnosis not present

## 2021-12-31 MED ORDER — PREDNISONE 10 MG PO TABS: ORAL_TABLET | ORAL | 0 refills | Status: DC

## 2021-12-31 MED ORDER — DOXYCYCLINE HYCLATE 100 MG PO TABS: 100.0000 mg | ORAL_TABLET | Freq: Two times a day (BID) | ORAL | 0 refills | Status: DC

## 2021-12-31 NOTE — Patient Instructions (Addendum)
Doxycycline 100 mg twice daily x 7 days with large glass of water before eating   Prednisone 10 mg take  4 each am x 2 days,   2 each am x 2 days,  1 each am x 2 days and stop    Please remember to go to the  x-ray department  for your tests - we will call you with the results when they are available    The key is to stop smoking completely before smoking completely stops you!    My office will be contacting you by phone for referral for lung cancer screening   - if you don't hear back from my office within one week please call us back or notify us thru MyChart and we'll address it right away.    Please schedule a follow up visit in 12  months but call sooner if needed

## 2021-12-31 NOTE — Assessment & Plan Note (Signed)
Low-dose CT lung cancer screening is recommended for patients who are 65-77 years of age with a 20+ pack-year history of smoking and who are currently smoking or quit <=15 years ago. No coughing up blood  No unintentional weight loss of > 15 pounds in the last 6 months - pt is eligible for scanning yearly until age 84 > referred

## 2021-12-31 NOTE — Telephone Encounter (Signed)
Pt is wanting to refill prescription for symbicort  please advise

## 2022-01-01 ENCOUNTER — Encounter: Payer: Self-pay | Admitting: Internal Medicine

## 2022-01-01 DIAGNOSIS — F4323 Adjustment disorder with mixed anxiety and depressed mood: Secondary | ICD-10-CM | POA: Diagnosis not present

## 2022-01-01 DIAGNOSIS — F410 Panic disorder [episodic paroxysmal anxiety] without agoraphobia: Secondary | ICD-10-CM | POA: Diagnosis not present

## 2022-01-01 NOTE — Assessment & Plan Note (Signed)
Onset "all her life"/ active smoker  Spirometry  09/25/15 FEV1 1.58  (70%)  Ratio 55 on flovent 110 2 bid  PFT  09/25/15:  FEV1 1.58 L (70%) FEV1/FVC 0.55   73% DLCO uncorrected 58% - PFT's  07/01/2017  FEV1 1.71 (77 % ) ratio 62  p 6 % improvement from saba p only flovent 110 prior to study with DLCO  60 % corrects to 72  % for alv volume  - 06/01/20  Change to trelegy  > preferred symbicort 160 - .08/06/2021 change to breztri 2 in am and prn the pm doses due to mouth irritation -    09/24/2021  continue breztri   - 12/31/2021  After extensive coaching inhaler device,  effectiveness =    85% > continue Breztri   Acute flare of AB  in setting of copd/ACOS  in active smoker   rec Doxy x 10 day  Prednisone 10 mg take  4 each am x 2 days,   2 each am x 2 days,  1 each am x 2 days and stop    F/u can continue to be q 12 m and prn

## 2022-01-16 ENCOUNTER — Other Ambulatory Visit: Payer: Medicare PPO

## 2022-01-16 DIAGNOSIS — F331 Major depressive disorder, recurrent, moderate: Secondary | ICD-10-CM | POA: Diagnosis not present

## 2022-01-16 DIAGNOSIS — M5481 Occipital neuralgia: Secondary | ICD-10-CM | POA: Diagnosis not present

## 2022-01-16 DIAGNOSIS — G4700 Insomnia, unspecified: Secondary | ICD-10-CM | POA: Diagnosis not present

## 2022-01-16 DIAGNOSIS — I6523 Occlusion and stenosis of bilateral carotid arteries: Secondary | ICD-10-CM | POA: Diagnosis not present

## 2022-01-16 DIAGNOSIS — M8580 Other specified disorders of bone density and structure, unspecified site: Secondary | ICD-10-CM | POA: Diagnosis not present

## 2022-01-16 DIAGNOSIS — N1831 Chronic kidney disease, stage 3a: Secondary | ICD-10-CM | POA: Diagnosis not present

## 2022-01-16 DIAGNOSIS — J4520 Mild intermittent asthma, uncomplicated: Secondary | ICD-10-CM | POA: Diagnosis not present

## 2022-01-16 DIAGNOSIS — E0590 Thyrotoxicosis, unspecified without thyrotoxic crisis or storm: Secondary | ICD-10-CM | POA: Diagnosis not present

## 2022-01-16 DIAGNOSIS — Z720 Tobacco use: Secondary | ICD-10-CM | POA: Diagnosis not present

## 2022-01-17 ENCOUNTER — Ambulatory Visit
Admission: RE | Admit: 2022-01-17 | Discharge: 2022-01-17 | Disposition: A | Discharge status: Home / Self Care | Payer: Medicare PPO | Source: Ambulatory Visit | Attending: Neurology | Admitting: Neurology

## 2022-01-17 DIAGNOSIS — M5481 Occipital neuralgia: Secondary | ICD-10-CM | POA: Diagnosis not present

## 2022-01-17 DIAGNOSIS — I6782 Cerebral ischemia: Secondary | ICD-10-CM | POA: Diagnosis not present

## 2022-01-17 MED ORDER — GADOPICLENOL 0.5 MMOL/ML IV SOLN
7.0000 mL | Freq: Once | INTRAVENOUS | Status: AC | PRN
  Administered 2022-01-17: 7 mL via INTRAVENOUS

## 2022-01-18 DIAGNOSIS — Z1231 Encounter for screening mammogram for malignant neoplasm of breast: Secondary | ICD-10-CM | POA: Diagnosis not present

## 2022-01-21 ENCOUNTER — Other Ambulatory Visit: Payer: Self-pay | Admitting: Internal Medicine

## 2022-01-21 ENCOUNTER — Other Ambulatory Visit (HOSPITAL_COMMUNITY): Payer: Self-pay | Admitting: Nurse Practitioner

## 2022-01-21 DIAGNOSIS — I480 Paroxysmal atrial fibrillation: Secondary | ICD-10-CM

## 2022-01-21 DIAGNOSIS — I10 Essential (primary) hypertension: Secondary | ICD-10-CM

## 2022-01-21 NOTE — Telephone Encounter (Signed)
Prescription refill request for Eliquis received. Indication:afib Last office visit:6/23 Scr:1.0 Age: 76 Weight:69.9  kg  Prescription refilled

## 2022-02-07 ENCOUNTER — Other Ambulatory Visit: Payer: Self-pay | Admitting: Nurse Practitioner

## 2022-02-12 ENCOUNTER — Other Ambulatory Visit: Payer: Self-pay | Admitting: Internal Medicine

## 2022-02-13 ENCOUNTER — Telehealth: Payer: Self-pay | Admitting: Internal Medicine

## 2022-02-13 DIAGNOSIS — J4540 Moderate persistent asthma, uncomplicated: Secondary | ICD-10-CM

## 2022-02-13 MED ORDER — DOXYCYCLINE HYCLATE 100 MG PO TABS: 100.0000 mg | ORAL_TABLET | Freq: Two times a day (BID) | ORAL | 0 refills | Status: DC

## 2022-02-13 MED ORDER — PREDNISONE 10 MG PO TABS: ORAL_TABLET | ORAL | 0 refills | Status: DC

## 2022-02-13 NOTE — Telephone Encounter (Signed)
Called and spoke with patient. She verbalized understanding. She is not able to take Advil. She wishes to go ahead and start the doxy. She will hold off on the prednisone for now. I advised her I would go ahead and send in the prednisone for the pharmacy to hold it.   Nothing further needed at time of call.

## 2022-02-13 NOTE — Telephone Encounter (Signed)
Advil cold and sinus  (has to get from pharmacist as behind the counter) will decongest her head and doxy 100 mg bid x 10 days should clear up discolored sputum  If breathing/ wheezing worsen also can call in  Prednisone 10 mg take  4 each am x 2 days,   2 each am x 2 days,  1 each am x 2 days and stop

## 2022-02-13 NOTE — Telephone Encounter (Signed)
Called and spoke with patient. She stated that she has congested for the past 2 days. She started out with a sneeze on Monday and the runny developed on Tuesday. She has been blowing yellow discharge out of her nose. When she talks a lot, she will temporarily lose her voice. She has a nonproductive cough and what she described as a yellow coating on the roof of her mouth. Denied any fever, body aches or increased SOB.   Pharmacy is [facility].   Dr. Melvyn Novas, can you please advise? Thanks!

## 2022-02-15 ENCOUNTER — Telehealth: Payer: Self-pay | Admitting: Internal Medicine

## 2022-02-15 NOTE — Telephone Encounter (Signed)
Called and spoke with patient. Patient stated she wanted to update Dr. Melvyn Novas, before the weekend.  Patient stated she is taking Doxycycline that she started Wednesday night. Patient stated she has not started Prednisone, but is on her way to pharmacy to pick it up now.  Patient stated she is still having productive cough with yellow sputum, notices some wheezing, and nasal congestion.  Patient stated she is using her emergency inhaler more then she should. Patient stated yesterday afternoon she was using it every 1-2 hours.  Patient stated she has covid tested twice and both were negative.  Last test was 02/13/22. Patient is hoping Prednisone helps, but was wanting to know if she may need another antibiotic?     Message routed to Dr. Melvyn Novas to advise

## 2022-02-15 NOTE — Telephone Encounter (Signed)
No, just needs to start the prednisone asap and only use the rescue inhaler if she is sob at rest, not for sob with activity that resolves with resting - if not comfortable at rest after xopenex needs to go to ER

## 2022-02-15 NOTE — Telephone Encounter (Signed)
Called and spoke with patient.  Dr. Gustavus Bryant recommendations given.  Understanding stated.  Patient stated she has started her first dose of prednisone.  Nothing further at this time.

## 2022-02-18 ENCOUNTER — Telehealth: Payer: Self-pay | Admitting: Internal Medicine

## 2022-02-18 NOTE — Telephone Encounter (Signed)
PT still having issues w/tremendous sinus/head pressure. Wonders if Dr. Melvyn Novas would approve Tylenol nasal decongestant. No relief since Pred, and other meds he recom, have been started. Please call to advise. TY [phone]

## 2022-02-20 NOTE — Telephone Encounter (Signed)
Called and spoke with the patient, advised of recommendations per Dr. Melvyn Novas.  She states she cannot take Advil products.  I told her to get the Mucinex D.  She said she is still getting yellow mucus out of her nose and a lot of it.  She says she has a pain in her head that comes and goes.  She denies any facial pain.  She denies any fever, but states she has had chills and body aches for 3 weeks.  She finished her prednisone today and still has some doxycycline.  She said she had covid back in September and has not been well since then.  She said states that the antibiotics and steroids really did not make any difference in how she felt.  She tested twice for Covid when she began to feel really bad, she last tested negative.  I advised her to finish her antibiotic, get the mucinex D and get a saline rinse kit and let us know if she is not feeling better after she has done all of these things.  Nothing further needed.

## 2022-02-20 NOTE — Telephone Encounter (Signed)
I spoke with pt and she states she still is having sinus congestion and wants to know if Dr Melvyn Novas approves her taking Tylenol sinus decongestant. I recommended mucinex but she stated "she needs Dr Gustavus Bryant approval" message sent to Dr Melvyn Novas

## 2022-02-20 NOTE — Telephone Encounter (Signed)
Mucinex D  (you have to sign for it ) is the best choice with advil cold and sinus another option (have to sign for it too)  I'm not sure which tylenol sinus product she's looking at - some of the otc's have phenylephrine in them which no longer is being used by mouth - the key ingredient she needs is pseudoephedrine.

## 2022-03-21 ENCOUNTER — Encounter: Payer: Self-pay | Admitting: Cardiology

## 2022-03-21 ENCOUNTER — Ambulatory Visit: Payer: Medicare PPO | Attending: Cardiology | Admitting: Cardiology

## 2022-03-21 VITALS — BP 108/60 | HR 67 | Ht 65.0 in | Wt 156.0 lb

## 2022-03-21 DIAGNOSIS — I480 Paroxysmal atrial fibrillation: Secondary | ICD-10-CM

## 2022-03-21 DIAGNOSIS — Z79899 Other long term (current) drug therapy: Secondary | ICD-10-CM | POA: Diagnosis not present

## 2022-03-21 DIAGNOSIS — Z01812 Encounter for preprocedural laboratory examination: Secondary | ICD-10-CM | POA: Diagnosis not present

## 2022-03-21 DIAGNOSIS — D6869 Other thrombophilia: Secondary | ICD-10-CM | POA: Diagnosis not present

## 2022-03-21 NOTE — Patient Instructions (Addendum)
Medication Instructions:  Your physician recommends that you continue on your current medications as directed. Please refer to the Current Medication list given to you today.  *If you need a refill on your cardiac medications before your next appointment, please call your pharmacy*   Lab Work: [facility] surveillance labs today: BMET & Magnesium  If you have labs (blood work) drawn today and your tests are completely normal, you will receive your results only by: [place] (if you have MyChart) OR A paper copy in the mail If you have any lab test that is abnormal or we need to change your treatment, we will call you to review the results.   Testing/Procedures: None ordered   Follow-Up: At [facility], you and your health needs are our priority.  As part of our continuing mission to provide you with exceptional heart care, we have created designated Provider Care Teams.  These Care Teams include your primary Cardiologist (physician) and Advanced Practice Providers (APPs -  Physician Assistants and Nurse Practitioners) who all work together to provide you with the care you need, when you need it.   Your next appointment:   6 month(s)  The format for your next appointment:   In Person  Provider:   You will follow up in the [facility] located at [facility]. Your provider will be: Clint R. Fenton, PA-C{   Thank you for choosing [facility]!!   Trinidad Curet, RN [phone]  Other Instructions

## 2022-03-21 NOTE — Progress Notes (Signed)
Electrophysiology Office Note   Date:  03/21/2022   ID:  Elizabeth, Mcconnell [birth year], MRN [id number]  PCP:  Elizabeth Bowen, MD  Cardiologist:   Primary Electrophysiologist:  Elizabeth Mcconnell Elizabeth Leeds, MD    Chief Complaint: AF   History of Present Illness: Elizabeth Mcconnell is a 77 y.o. female who is being seen today for the evaluation of AF at the request of Elizabeth Bowen, MD. Presenting today for electrophysiology evaluation.  She has a history sniffer atrial fibrillation, atrial flutter, hypertension.  She is status post atrial fibrillation ablation 11/05/2016.  She is currently on dofetilide.  She is currently feeling well.  She has noted no further episodes of atrial fibrillation.  She has been on dofetilide for quite some time and has been tolerating it well without issue.  Today, she denies symptoms of palpitations, chest pain, shortness of breath, orthopnea, PND, lower extremity edema, claudication, dizziness, presyncope, syncope, bleeding, or neurologic sequela. The patient is tolerating medications without difficulties.    Past Medical History:  Diagnosis Date   Adenomatous polyp of colon 10/2013   f/u colo 10/2018   Allergic rhinitis    ALLERGIC RHINITIS    ANXIETY    "situational" (02/14/2015)   Arthritis    "neck, back, hands" (02/14/2015)   Asthma    Atrial fibrillation with RVR ([place])    Atrial flutter ([place])    failed cardioversion 11/2012; s/p ablation 02-02-2013 by Dr Elizabeth Mcconnell   Chronic bronchitis ([place])    "I'll have it most years" (02/14/2015)   Diverticulosis    Endometriosis    Essential hypertension 05/30/2014   Hepatic steatosis    Oral candidiasis    Osteopenia 08/2015   T score -1.9 FRAX 16% / 6.5%   Pneumothorax on left 1996   left lower lung collapse s/p resection   Small bowel obstruction ([place])    Tobacco abuse    Past Surgical History:  Procedure Laterality Date   ABDOMINAL HYSTERECTOMY     APPENDECTOMY  1978   ATRIAL FIBRILLATION  ABLATION N/A 11/05/2016   Procedure: Atrial Fibrillation Ablation;  Surgeon: Thompson Grayer, MD;  Location: [facility];  Service: Cardiovascular;  Laterality: N/A;   ATRIAL FLUTTER ABLATION N/A 02/02/2013   Procedure: ATRIAL FLUTTER ABLATION;  Surgeon: Elizabeth Mark, MD;  Location: [facility];  Service: Cardiovascular;  Laterality: N/A;   CARDIOVERSION N/A 11/27/2012   Procedure: CARDIOVERSION;  Surgeon: Elizabeth Headings, MD;  Location: [facility];  Service: Cardiovascular;  Laterality: N/A;   [place]; [place] N/A 05/04/2015   Procedure: LAPAROSCOPIC LOW ANTERIOR RESECTION LAPAROSCOPIC LYSIS OF ADHESIONS, SPLENIC FLEXURE MOBILIZATION;  Surgeon: Elizabeth Boston, MD;  Location: [facility];  Service: General;  Laterality: N/A;   LUNG REMOVAL, PARTIAL Left 02/1965   LLL   TOTAL ABDOMINAL HYSTERECTOMY W/ BILATERAL SALPINGOOPHORECTOMY  1984   Endometriosis     Current Outpatient Medications  Medication Sig Dispense Refill   acetaminophen (TYLENOL) 650 MG CR tablet Take 650 mg by mouth at bedtime.     ALPRAZolam (XANAX) 0.25 MG tablet TAKE 1 TABLET BY MOUTH AT BEDTIME AS NEEDED FOR ANXIETY OR SLEEP 30 tablet 3   amLODipine (NORVASC) 2.5 MG tablet TAKE 1 TABLET BY MOUTH EVERY DAY 90 tablet 3   budesonide-formoterol (SYMBICORT) 160-4.5 MCG/ACT inhaler INHALE 2 PUFFS FIRST THING in THE MORNING, THEN INHALE 2 PUFFS 12 HOURS LATER 10.2 g 12   busPIRone (BUSPAR) 10 MG tablet Take 10 mg by  mouth 2 (two) times daily.     BYSTOLIC 5 MG tablet TAKE 1 TABLET BY MOUTH EVERY DAY 90 tablet 0   Calcium Carb-Cholecalciferol 600-800 MG-UNIT TABS Take 1 tablet by mouth daily.     dofetilide (TIKOSYN) 250 MCG capsule TAKE 1 CAPSULE BY MOUTH 2 TIMES DAILY 180 capsule 1   ELIQUIS 5 MG TABS tablet TAKE 1 TABLET BY MOUTH 2 TIMES DAILY 180 tablet 1   fluticasone (FLONASE) 50 MCG/ACT nasal spray USE 2 SPRAYS IN EACH NOSTRIL 2 TIMES DAILY 16 g 5   levalbuterol (XOPENEX HFA) 45  MCG/ACT inhaler inhale 1-2 PUFFS INTO THE LUNGS EVERY 4 HOURS AS NEEDED 15 g 2   losartan (COZAAR) 100 MG tablet TAKE 1 TABLET BY MOUTH EVERY DAY 30 tablet 8   magnesium oxide (MAG-OX) 400 MG tablet Take 1 tablet (400 mg total) by mouth daily. 30 tablet 6   mirtazapine (REMERON) 15 MG tablet Take 15 mg by mouth at bedtime.     montelukast (SINGULAIR) 10 MG tablet TAKE 1 TABLET BY MOUTH EVERY DAY 90 tablet 3   potassium chloride (KLOR-CON) 10 MEQ tablet TAKE 2 TABLETS BY MOUTH EVERY DAY 60 tablet 0   pravastatin (PRAVACHOL) 40 MG tablet Take 1 tablet by mouth daily.     traZODone (DESYREL) 50 MG tablet Take 50-100 mg by mouth at bedtime as needed for sleep.     No current facility-administered medications for this visit.    Allergies:   Tramadol, Amoxicillin-pot clavulanate, Amoxicillin, Flagyl [metronidazole], Flecainide, Gabapentin, Omnicef [cefdinir], Avelox [moxifloxacin], and Clavulanic acid   Social History:  The patient  reports that she quit smoking about 5 months ago. Her smoking use included cigarettes. She smoked an average of .25 packs per day. She has never used smokeless tobacco. She reports current alcohol use of about 14.0 standard drinks of alcohol per week. She reports that she does not use drugs.   Family History:  The patient's family history includes Asthma in her child and maternal grandfather; Diabetes in her mother; Heart disease in her father; Hypertension in her father; Stroke (age of onset: 84) in her father.    ROS:  Please see the history of present illness.   Otherwise, review of systems is positive for none.   All other systems are reviewed and negative.    PHYSICAL EXAM: VS:  BP 108/60   Pulse 67   Ht '5\' 5"'$  (1.651 m)   Wt 156 lb (70.8 kg)   SpO2 95%   BMI 25.96 kg/m  , BMI Body mass index is 25.96 kg/m. GEN: Well nourished, well developed, in no acute distress  HEENT: normal  Neck: no JVD, carotid bruits, or masses Cardiac: RRR; no murmurs, rubs, or  gallops,no edema  Respiratory:  clear to auscultation bilaterally, normal work of breathing GI: soft, nontender, nondistended, + BS MS: no deformity or atrophy  Skin: warm and dry Neuro:  Strength and sensation are intact Psych: euthymic mood, full affect  EKG:  EKG is ordered today. Personal review of the ekg ordered shows sinus rhythm   Recent Labs: 06/30/2021: Hemoglobin 13.7; Platelets 154 08/15/2021: BUN 9; Creatinine, Ser 1.00; Magnesium 2.3; Potassium 4.4; Sodium 138 11/13/2021: TSH 0.83    Lipid Panel     Component Value Date/Time   CHOL 195 10/14/2018 1451   TRIG 200.0 (H) 10/14/2018 1451   HDL 75.50 10/14/2018 1451   CHOLHDL 3 10/14/2018 1451   VLDL 40.0 10/14/2018 1451   LDLCALC 79 10/14/2018 1451  LDLDIRECT 116.0 09/15/2017 1353     Wt Readings from Last 3 Encounters:  03/21/22 156 lb (70.8 kg)  12/31/21 154 lb (69.9 kg)  11/13/21 153 lb 3.2 oz (69.5 kg)      Other studies Reviewed: Additional studies/ records that were reviewed today include: TTE 2016  Review of the above records today demonstrates:  - Left ventricle: The cavity size was normal. Wall thickness was    normal. Systolic function was normal. The estimated ejection    fraction was in the range of 60% to 65%. Wall motion was normal;    there were no regional wall motion abnormalities. Left    ventricular diastolic function parameters were normal.  - Mitral valve: There was mild regurgitation.  - Atrial septum: No defect or patent foramen ovale was identified.    ASSESSMENT AND PLAN:  1.  Paroxysmal atrial fibrillation: Currently on dofetilide to 50 mcg twice daily, Bystolic 5 mg daily, Eliquis 5 mg twice daily.  Has had no further episodes of atrial fibrillation.  Continue with current management.  2.  Hypertension: Currently well-controlled  3.  Secondary hypercoagulable state: Currently on Eliquis for atrial fibrillation as above  4.  High risk medication monitoring: Continue  dofetilide for atrial fibrillation as above.  Dofetilide labs below.  QTc stable.    Current medicines are reviewed at length with the patient today.   The patient does not have concerns regarding her medicines.  The following changes were made today:  none  Labs/ tests ordered today include:  Orders Placed This Encounter  Procedures   Basic metabolic panel   Magnesium   EKG 12-Lead     Disposition:   FU 6 months  Signed, Dairon Procter Elizabeth Leeds, MD  03/21/2022 1:44 PM     [address] [place] [place] [address] [phone] (office) [phone] (fax)

## 2022-03-22 LAB — MAGNESIUM: Magnesium: 2.3 mg/dL (ref 1.6–2.3)

## 2022-03-22 LAB — BASIC METABOLIC PANEL
BUN/Creatinine Ratio: 13 (ref 12–28)
BUN: 12 mg/dL (ref 8–27)
CO2: 24 mmol/L (ref 20–29)
Calcium: 9.7 mg/dL (ref 8.7–10.3)
Chloride: 102 mmol/L (ref 96–106)
Creatinine, Ser: 0.89 mg/dL (ref 0.57–1.00)
Glucose: 88 mg/dL (ref 70–99)
Potassium: 4.7 mmol/L (ref 3.5–5.2)
Sodium: 142 mmol/L (ref 134–144)
eGFR: 66 mL/min/{1.73_m2} (ref >59)

## 2022-04-04 ENCOUNTER — Other Ambulatory Visit: Payer: Self-pay | Admitting: Internal Medicine

## 2022-04-11 ENCOUNTER — Encounter (HOSPITAL_COMMUNITY): Payer: Self-pay | Admitting: *Deleted

## 2022-04-20 ENCOUNTER — Other Ambulatory Visit: Payer: Self-pay

## 2022-04-20 ENCOUNTER — Emergency Department (HOSPITAL_BASED_OUTPATIENT_CLINIC_OR_DEPARTMENT_OTHER): Payer: Medicare PPO

## 2022-04-20 ENCOUNTER — Encounter (HOSPITAL_BASED_OUTPATIENT_CLINIC_OR_DEPARTMENT_OTHER): Payer: Self-pay | Admitting: Emergency Medicine

## 2022-04-20 ENCOUNTER — Inpatient Hospital Stay (HOSPITAL_BASED_OUTPATIENT_CLINIC_OR_DEPARTMENT_OTHER)
Admission: EM | Admit: 2022-04-20 | Discharge: 2022-04-26 | DRG: 563 | Disposition: A | Discharge status: Skilled Nursing Facility | Payer: Medicare PPO | Attending: Internal Medicine | Admitting: Internal Medicine

## 2022-04-20 DIAGNOSIS — S42354A Nondisplaced comminuted fracture of shaft of humerus, right arm, initial encounter for closed fracture: Secondary | ICD-10-CM | POA: Diagnosis not present

## 2022-04-20 DIAGNOSIS — J4489 Other specified chronic obstructive pulmonary disease: Secondary | ICD-10-CM | POA: Diagnosis present

## 2022-04-20 DIAGNOSIS — E663 Overweight: Secondary | ICD-10-CM | POA: Diagnosis present

## 2022-04-20 DIAGNOSIS — M1990 Unspecified osteoarthritis, unspecified site: Secondary | ICD-10-CM | POA: Diagnosis present

## 2022-04-20 DIAGNOSIS — Z881 Allergy status to other antibiotic agents status: Secondary | ICD-10-CM

## 2022-04-20 DIAGNOSIS — Z713 Dietary counseling and surveillance: Secondary | ICD-10-CM

## 2022-04-20 DIAGNOSIS — Z8249 Family history of ischemic heart disease and other diseases of the circulatory system: Secondary | ICD-10-CM

## 2022-04-20 DIAGNOSIS — W1809XA Striking against other object with subsequent fall, initial encounter: Secondary | ICD-10-CM | POA: Diagnosis present

## 2022-04-20 DIAGNOSIS — S42201A Unspecified fracture of upper end of right humerus, initial encounter for closed fracture: Secondary | ICD-10-CM

## 2022-04-20 DIAGNOSIS — T40605A Adverse effect of unspecified narcotics, initial encounter: Secondary | ICD-10-CM | POA: Diagnosis not present

## 2022-04-20 DIAGNOSIS — D72829 Elevated white blood cell count, unspecified: Secondary | ICD-10-CM | POA: Diagnosis not present

## 2022-04-20 DIAGNOSIS — Z7951 Long term (current) use of inhaled steroids: Secondary | ICD-10-CM

## 2022-04-20 DIAGNOSIS — Z79899 Other long term (current) drug therapy: Secondary | ICD-10-CM | POA: Diagnosis not present

## 2022-04-20 DIAGNOSIS — Z7901 Long term (current) use of anticoagulants: Secondary | ICD-10-CM

## 2022-04-20 DIAGNOSIS — S4291XA Fracture of right shoulder girdle, part unspecified, initial encounter for closed fracture: Secondary | ICD-10-CM | POA: Diagnosis not present

## 2022-04-20 DIAGNOSIS — R0902 Hypoxemia: Secondary | ICD-10-CM | POA: Diagnosis not present

## 2022-04-20 DIAGNOSIS — E8809 Other disorders of plasma-protein metabolism, not elsewhere classified: Secondary | ICD-10-CM | POA: Diagnosis not present

## 2022-04-20 DIAGNOSIS — I10 Essential (primary) hypertension: Secondary | ICD-10-CM | POA: Diagnosis present

## 2022-04-20 DIAGNOSIS — S4290XA Fracture of unspecified shoulder girdle, part unspecified, initial encounter for closed fracture: Secondary | ICD-10-CM | POA: Diagnosis present

## 2022-04-20 DIAGNOSIS — K760 Fatty (change of) liver, not elsewhere classified: Secondary | ICD-10-CM | POA: Diagnosis present

## 2022-04-20 DIAGNOSIS — E871 Hypo-osmolality and hyponatremia: Secondary | ICD-10-CM | POA: Diagnosis not present

## 2022-04-20 DIAGNOSIS — D649 Anemia, unspecified: Secondary | ICD-10-CM | POA: Diagnosis present

## 2022-04-20 DIAGNOSIS — J439 Emphysema, unspecified: Secondary | ICD-10-CM | POA: Diagnosis not present

## 2022-04-20 DIAGNOSIS — Z90722 Acquired absence of ovaries, bilateral: Secondary | ICD-10-CM

## 2022-04-20 DIAGNOSIS — E785 Hyperlipidemia, unspecified: Secondary | ICD-10-CM | POA: Diagnosis present

## 2022-04-20 DIAGNOSIS — Z9049 Acquired absence of other specified parts of digestive tract: Secondary | ICD-10-CM

## 2022-04-20 DIAGNOSIS — S92355A Nondisplaced fracture of fifth metatarsal bone, left foot, initial encounter for closed fracture: Secondary | ICD-10-CM | POA: Diagnosis present

## 2022-04-20 DIAGNOSIS — Z87891 Personal history of nicotine dependence: Secondary | ICD-10-CM

## 2022-04-20 DIAGNOSIS — Z90710 Acquired absence of both cervix and uterus: Secondary | ICD-10-CM

## 2022-04-20 DIAGNOSIS — M25511 Pain in right shoulder: Secondary | ICD-10-CM | POA: Diagnosis not present

## 2022-04-20 DIAGNOSIS — J309 Allergic rhinitis, unspecified: Secondary | ICD-10-CM | POA: Diagnosis present

## 2022-04-20 DIAGNOSIS — Z888 Allergy status to other drugs, medicaments and biological substances status: Secondary | ICD-10-CM

## 2022-04-20 DIAGNOSIS — E872 Acidosis, unspecified: Secondary | ICD-10-CM | POA: Diagnosis not present

## 2022-04-20 DIAGNOSIS — S42291A Other displaced fracture of upper end of right humerus, initial encounter for closed fracture: Principal | ICD-10-CM | POA: Diagnosis present

## 2022-04-20 DIAGNOSIS — I480 Paroxysmal atrial fibrillation: Secondary | ICD-10-CM | POA: Diagnosis present

## 2022-04-20 DIAGNOSIS — Z9079 Acquired absence of other genital organ(s): Secondary | ICD-10-CM

## 2022-04-20 DIAGNOSIS — Z751 Person awaiting admission to adequate facility elsewhere: Secondary | ICD-10-CM

## 2022-04-20 DIAGNOSIS — Z6826 Body mass index (BMI) 26.0-26.9, adult: Secondary | ICD-10-CM

## 2022-04-20 DIAGNOSIS — R918 Other nonspecific abnormal finding of lung field: Secondary | ICD-10-CM | POA: Diagnosis not present

## 2022-04-20 DIAGNOSIS — Z79891 Long term (current) use of opiate analgesic: Secondary | ICD-10-CM | POA: Diagnosis not present

## 2022-04-20 DIAGNOSIS — S42293A Other displaced fracture of upper end of unspecified humerus, initial encounter for closed fracture: Secondary | ICD-10-CM | POA: Diagnosis present

## 2022-04-20 DIAGNOSIS — F411 Generalized anxiety disorder: Secondary | ICD-10-CM | POA: Diagnosis present

## 2022-04-20 DIAGNOSIS — M7989 Other specified soft tissue disorders: Secondary | ICD-10-CM | POA: Diagnosis not present

## 2022-04-20 DIAGNOSIS — S92354A Nondisplaced fracture of fifth metatarsal bone, right foot, initial encounter for closed fracture: Secondary | ICD-10-CM | POA: Diagnosis not present

## 2022-04-20 DIAGNOSIS — W19XXXA Unspecified fall, initial encounter: Secondary | ICD-10-CM | POA: Diagnosis not present

## 2022-04-20 DIAGNOSIS — R410 Disorientation, unspecified: Secondary | ICD-10-CM | POA: Diagnosis not present

## 2022-04-20 LAB — CBC
HCT: 39.7 % (ref 36.0–46.0)
Hemoglobin: 13 g/dL (ref 12.0–15.0)
MCH: 29.4 pg (ref 26.0–34.0)
MCHC: 32.7 g/dL (ref 30.0–36.0)
MCV: 89.8 fL (ref 80.0–100.0)
Platelets: 175 10*3/uL (ref 150–400)
RBC: 4.42 MIL/uL (ref 3.87–5.11)
RDW: 13.5 % (ref 11.5–15.5)
WBC: 14.8 10*3/uL — ABNORMAL HIGH (ref 4.0–10.5)
nRBC: 0 % (ref 0.0–0.2)

## 2022-04-20 LAB — BASIC METABOLIC PANEL
Anion gap: 10 (ref 5–15)
BUN: 11 mg/dL (ref 8–23)
CO2: 22 mmol/L (ref 22–32)
Calcium: 9.8 mg/dL (ref 8.9–10.3)
Chloride: 104 mmol/L (ref 98–111)
Creatinine, Ser: 0.72 mg/dL (ref 0.44–1.00)
GFR, Estimated: >60 mL/min (ref >60)
Glucose, Bld: 120 mg/dL — ABNORMAL HIGH (ref 70–99)
Potassium: 4.3 mmol/L (ref 3.5–5.1)
Sodium: 136 mmol/L (ref 135–145)

## 2022-04-20 MED ORDER — BUSPIRONE HCL 10 MG PO TABS
10.0000 mg | ORAL_TABLET | Freq: Two times a day (BID) | ORAL | Status: DC
  Administered 2022-04-21 – 2022-04-26 (×7): 10 mg via ORAL
  Filled 2022-04-20 (×13): qty 1

## 2022-04-20 MED ORDER — ACETAMINOPHEN 325 MG PO TABS: 650.0000 mg | ORAL_TABLET | Freq: Four times a day (QID) | ORAL | 0 refills | Status: AC | PRN

## 2022-04-20 MED ORDER — APIXABAN 5 MG PO TABS: 5.0000 mg | ORAL_TABLET | Freq: Two times a day (BID) | ORAL | Status: DC

## 2022-04-20 MED ORDER — NEBIVOLOL HCL 5 MG PO TABS
5.0000 mg | ORAL_TABLET | Freq: Every day | ORAL | Status: DC
  Administered 2022-04-21 – 2022-04-26 (×6): 5 mg via ORAL
  Filled 2022-04-20 (×6): qty 1

## 2022-04-20 MED ORDER — MONTELUKAST SODIUM 10 MG PO TABS
10.0000 mg | ORAL_TABLET | Freq: Every day | ORAL | Status: DC
  Administered 2022-04-21 – 2022-04-26 (×6): 10 mg via ORAL
  Filled 2022-04-20 (×7): qty 1

## 2022-04-20 MED ORDER — PRAVASTATIN SODIUM 40 MG PO TABS
40.0000 mg | ORAL_TABLET | Freq: Every day | ORAL | Status: DC
  Administered 2022-04-21 – 2022-04-25 (×5): 40 mg via ORAL
  Filled 2022-04-20 (×6): qty 1

## 2022-04-20 MED ORDER — ALPRAZOLAM 0.5 MG PO TABS
0.5000 mg | ORAL_TABLET | Freq: Every evening | ORAL | Status: DC | PRN
  Administered 2022-04-21 – 2022-04-25 (×5): 0.5 mg via ORAL
  Filled 2022-04-20 (×7): qty 1

## 2022-04-20 MED ORDER — DOFETILIDE 250 MCG PO CAPS
250.0000 ug | ORAL_CAPSULE | Freq: Two times a day (BID) | ORAL | Status: DC
  Administered 2022-04-21 – 2022-04-26 (×11): 250 ug via ORAL
  Filled 2022-04-20 (×14): qty 1

## 2022-04-20 MED ORDER — OXYCODONE HCL 5 MG PO TABS: 5.0000 mg | ORAL_TABLET | Freq: Three times a day (TID) | ORAL | 0 refills | Status: DC | PRN

## 2022-04-20 MED ORDER — ONDANSETRON HCL 4 MG/2ML IJ SOLN
4.0000 mg | Freq: Once | INTRAMUSCULAR | Status: AC
  Administered 2022-04-20: 4 mg via INTRAVENOUS
  Filled 2022-04-20: qty 2

## 2022-04-20 MED ORDER — MORPHINE SULFATE (PF) 4 MG/ML IV SOLN
4.0000 mg | Freq: Once | INTRAVENOUS | Status: AC
  Administered 2022-04-20: 4 mg via INTRAVENOUS
  Filled 2022-04-20: qty 1

## 2022-04-20 MED ORDER — AMLODIPINE BESYLATE 5 MG PO TABS
2.5000 mg | ORAL_TABLET | Freq: Every day | ORAL | Status: DC
  Administered 2022-04-21 – 2022-04-26 (×6): 2.5 mg via ORAL
  Filled 2022-04-20 (×6): qty 1

## 2022-04-20 MED ORDER — POTASSIUM CHLORIDE CRYS ER 10 MEQ PO TBCR
20.0000 meq | EXTENDED_RELEASE_TABLET | Freq: Every day | ORAL | Status: DC
  Administered 2022-04-21 – 2022-04-26 (×6): 20 meq via ORAL
  Filled 2022-04-20 (×7): qty 2

## 2022-04-20 MED ORDER — OXYCODONE-ACETAMINOPHEN 5-325 MG PO TABS
1.0000 | ORAL_TABLET | Freq: Once | ORAL | Status: AC
  Administered 2022-04-20: 1 via ORAL
  Filled 2022-04-20: qty 1

## 2022-04-20 MED ORDER — LOSARTAN POTASSIUM 50 MG PO TABS
100.0000 mg | ORAL_TABLET | Freq: Every day | ORAL | Status: DC
  Administered 2022-04-21 – 2022-04-26 (×6): 100 mg via ORAL
  Filled 2022-04-20 (×6): qty 2

## 2022-04-20 NOTE — ED Triage Notes (Signed)
Tripped/ fall on uneven flooring. Happened around 4;15pm On eliquis Fell onto right shoulder and hit outside of left foot Denies hitting head or neck pain

## 2022-04-20 NOTE — ED Provider Notes (Signed)
[facility] Provider Note   CSN: [id number] Arrival date & time: 04/20/22  1640     History  Chief Complaint  Patient presents with   Fall    Elizabeth Mcconnell is a 77 y.o. female presenting to the ER with a fall.  Patient reports that she tripped on uneven pavement and fell onto her right shoulder earlier today.  She is having pain in her right shoulder now cannot move it.  She also reports that she injured her had pain in her left foot near the base of the fifth toe.  She has been able to walk on the foot.  She denies any head injury or loss of consciousness.  She is on Eliquis for A-fib.  She is here with a family friend present at the bedside.  HPI     Home Medications Prior to Admission medications   Medication Sig Start Date End Date Taking? Authorizing Provider  acetaminophen (TYLENOL) 325 MG tablet Take 2 tablets (650 mg total) by mouth every 6 (six) hours as needed for up to 30 doses for moderate pain or mild pain. 04/20/22  Yes Dove Gresham, Carola Rhine, MD  oxyCODONE (ROXICODONE) 5 MG immediate release tablet Take 1 tablet (5 mg total) by mouth every 8 (eight) hours as needed for up to 15 doses for severe pain. 04/20/22  Yes Wyvonnia Dusky, MD  acetaminophen (TYLENOL) 650 MG CR tablet Take 650 mg by mouth at bedtime.    [provider]  ALPRAZolam Duanne Moron) 0.25 MG tablet TAKE 1 TABLET BY MOUTH AT BEDTIME AS NEEDED FOR ANXIETY OR SLEEP 08/15/19   Janith Lima, MD  amLODipine (NORVASC) 2.5 MG tablet TAKE 1 TABLET BY MOUTH EVERY DAY 11/13/21   Sherran Needs, NP  budesonide-formoterol (SYMBICORT) 160-4.5 MCG/ACT inhaler INHALE 2 PUFFS FIRST THING in THE MORNING, THEN INHALE 2 PUFFS 12 HOURS LATER 12/31/21   Tanda Rockers, MD  busPIRone (BUSPAR) 10 MG tablet Take 10 mg by mouth 2 (two) times daily. 07/27/21   [provider]  BYSTOLIC 5 MG tablet TAKE 1 TABLET BY MOUTH EVERY DAY 09/17/19   Janith Lima, MD  Calcium  Carb-Cholecalciferol 600-800 MG-UNIT TABS Take 1 tablet by mouth daily.    [provider]  dofetilide (TIKOSYN) 250 MCG capsule TAKE 1 CAPSULE BY MOUTH 2 TIMES DAILY 02/07/22   Sherran Needs, NP  ELIQUIS 5 MG TABS tablet TAKE 1 TABLET BY MOUTH 2 TIMES DAILY 01/21/22   Allred, Jeneen Rinks, MD  fluticasone [facility]) 50 MCG/ACT nasal spray USE 2 SPRAYS IN [facility] NOSTRIL 2 TIMES DAILY 02/12/22   Tanda Rockers, MD  levalbuterol [facility] HFA) 45 MCG/ACT inhaler inhale 1-2 PUFFS INTO THE LUNGS EVERY 4 HOURS AS NEEDED 04/04/22   Tanda Rockers, MD  losartan (COZAAR) 100 MG tablet TAKE 1 TABLET BY MOUTH EVERY DAY 01/21/22   Sherran Needs, NP  magnesium oxide (MAG-OX) 400 MG tablet Take 1 tablet (400 mg total) by mouth daily. 02/13/15   Sherren Mocha, MD  mirtazapine (REMERON) 15 MG tablet Take 15 mg by mouth at bedtime. 07/27/21   [provider]  montelukast (SINGULAIR) 10 MG tablet TAKE 1 TABLET BY MOUTH EVERY DAY 08/08/21   Tanda Rockers, MD  potassium chloride (KLOR-CON) 10 MEQ tablet TAKE 2 TABLETS BY MOUTH EVERY DAY 08/06/21   Allred, Jeneen Rinks, MD  pravastatin (PRAVACHOL) 40 MG tablet Take 1 tablet by mouth daily. 07/04/20   [provider]  traZODone (DESYREL) 50 MG tablet Take 50-100 mg by mouth at bedtime as needed for sleep. 02/07/22   [provider]      Allergies    Tramadol, Amoxicillin-pot clavulanate, Amoxicillin, Flagyl [metronidazole], Flecainide, Gabapentin, Omnicef [cefdinir], Avelox [moxifloxacin], and Clavulanic acid    Review of Systems   Review of Systems  Physical Exam Updated Vital Signs BP (!) 141/70   Pulse 69   Temp 98 F (36.7 C) (Oral)   Resp 19   SpO2 96%  Physical Exam Constitutional:      General: She is not in acute distress. HENT:     Head: Normocephalic and atraumatic.  Eyes:     Conjunctiva/sclera: Conjunctivae normal.     Pupils: Pupils are equal, round, and reactive to light.  Cardiovascular:     Rate and Rhythm: Normal  rate and regular rhythm.     Pulses: Normal pulses.  Pulmonary:     Effort: Pulmonary effort is normal. No respiratory distress.  Abdominal:     General: There is no distension.     Tenderness: There is no abdominal tenderness.  Musculoskeletal:     Comments: Tenderness involving the proximal humerus, patient is not able to move arm due to pain Tenderness at the base of the fifth metatarsal, no visible deformity or discoloration  Skin:    General: Skin is warm and dry.  Neurological:     General: No focal deficit present.     Mental Status: She is alert and oriented to person, place, and time. Mental status is at baseline.     Sensory: No sensory deficit.  Psychiatric:        Mood and Affect: Mood normal.        Behavior: Behavior normal.     ED Results / Procedures / Treatments   Labs (all labs ordered are listed, but only abnormal results are displayed) Labs Reviewed  BASIC METABOLIC PANEL - Abnormal; Notable for the following components:      Result Value   Glucose, Bld 120 (*)    All other components within normal limits  CBC - Abnormal; Notable for the following components:   WBC 14.8 (*)    All other components within normal limits    EKG None  Radiology DG Toe 5th Left  Result Date: 04/20/2022 CLINICAL DATA:  Fifth toe pain with known fifth metatarsal fracture. EXAM: DG TOE 5TH LEFT COMPARISON:  Films from earlier in the same day. FINDINGS: There is a lucency along the lateral aspect of the distal fifth proximal phalanx suspicious for undisplaced fracture. No other fracture seen. The known proximal fifth metatarsal fracture is not appreciated on this exam. IMPRESSION: Lucency in the fifth proximal phalanx distally suspicious for undisplaced fracture. Electronically Signed   By: Inez Catalina M.D.   On: 04/20/2022 18:46   DG Foot 2 Views Left  Result Date: 04/20/2022 CLINICAL DATA:  a tripped and fell. Lateral foot pain. Patient on Eliquis. EXAM: LEFT FOOT - 2 VIEW  COMPARISON:  None Available. FINDINGS: Fracture, nondisplaced, across the base of the fifth metatarsal, predominantly across the metaphysis, a Jones fracture. No other fracture.  No bone lesion. Asymmetric joint space narrowing of the first metatarsal phalangeal joint. Remaining joints are normally spaced and aligned. Small dorsal and plantar calcaneal spurs. Mild lateral soft tissue swelling. IMPRESSION: 1. Nondisplaced fracture across the proximal fifth metatarsal, across the metaphysis. 2. No other fractures.  No dislocation. Electronically Signed   By: Dedra Skeens.D.  On: 04/20/2022 17:38   DG Shoulder Right  Result Date: 04/20/2022 CLINICAL DATA:  Tripped and fell.  Shoulder pain EXAM: RIGHT SHOULDER - 2+ VIEW COMPARISON:  None Available. FINDINGS: Comminuted fracture of the RIGHT humeral head. Impaction type fracture. Avulsion of the greater tuberosity. Subluxation of the humeral head in the glenohumeral joint inferiorly suggest hemarthrosis. IMPRESSION: 1. Mildly comminuted impaction fracture of the RIGHT humeral head with avulsion of the greater tuberosity. 2. Hemarthrosis with inferior subluxation. Electronically Signed   By: Suzy Bouchard M.D.   On: 04/20/2022 17:31    Procedures Procedures    Medications Ordered in ED Medications  oxyCODONE-acetaminophen (PERCOCET/ROXICET) 5-325 MG per tablet 1 tablet (1 tablet Oral Given 04/20/22 1729)  morphine (PF) 4 MG/ML injection 4 mg (4 mg Intravenous Given 04/20/22 2018)  ondansetron (ZOFRAN) injection 4 mg (4 mg Intravenous Given 04/20/22 2018)    ED Course/ Medical Decision Making/ A&P Clinical Course as of 04/20/22 2335  Sat Apr 20, 2022  2102 Dr Lyla Glassing from orthopedics recommends that the patient would require nonweightbearing status and likely more urgent orthopedic consultation for her Jones fracture.  Due to the nature of the patient's injuries, she will be admitted to the hospital.  Hospitalist service consulted [MT]     Clinical Course User Index [MT] Adia Crammer, Carola Rhine, MD                             Medical Decision Making Amount and/or Complexity of Data Reviewed Labs: ordered. Radiology: ordered.  Risk OTC drugs. Prescription drug management. Decision regarding hospitalization.   Patient is here with a mechanical fall and isolated injury to the right shoulder and the left foot.  X-rays ordered personally viewed interpreted, showing Jones fracture left foot.  Humerus fracture of the right proximal humerus.  Percocet was ordered for pain.  The patient cannot take NSAIDs due to Eliquis use at home.  She is neurovascularly intact on exam.  No evidence of head trauma.  No indication for CT imaging of the head or the spine at this time.  No other traumatic injuries noted on exam.  Arm is placed in a shoulder sling for immobilization.  Left foot was placed in a posterior stirrup splint per recommendation by orthopedic doctor.  Orthopedic doctor consulted, please see ED course.  We will plan for medical admission.  Patient's basic labs are personally reviewed, no emergent findings, she has some nonspecific chronic leukocytosis but low suspicion for sepsis or infection otherwise.  Pain medications given in the ED.  Patient's son was updated by phone as well.        Final Clinical Impression(s) / ED Diagnoses Final diagnoses:  Closed nondisplaced fracture of fifth metatarsal bone of left foot, initial encounter  Closed fracture of proximal end of right humerus, unspecified fracture morphology, initial encounter    Rx / DC Orders ED Discharge Orders          Ordered    oxyCODONE (ROXICODONE) 5 MG immediate release tablet  Every 8 hours PRN        04/20/22 1902    acetaminophen (TYLENOL) 325 MG tablet  Every 6 hours PRN        04/20/22 1902              Wyvonnia Dusky, MD 04/20/22 [phone]

## 2022-04-20 NOTE — ED Notes (Signed)
RN at bedside. 85% on room air. Respirations are equal and nonlabored. Skin warm and dry. Alert to verbal stimuli and oriented x4. Reports medication is helping with pain. Placed on oxygen via [place] at 2L and instructed to take some deep breaths with improvement. MD notified.

## 2022-04-21 ENCOUNTER — Observation Stay (HOSPITAL_COMMUNITY): Payer: Medicare PPO

## 2022-04-21 DIAGNOSIS — Z7901 Long term (current) use of anticoagulants: Secondary | ICD-10-CM | POA: Diagnosis not present

## 2022-04-21 DIAGNOSIS — S42293A Other displaced fracture of upper end of unspecified humerus, initial encounter for closed fracture: Secondary | ICD-10-CM | POA: Diagnosis present

## 2022-04-21 DIAGNOSIS — Z7951 Long term (current) use of inhaled steroids: Secondary | ICD-10-CM | POA: Diagnosis not present

## 2022-04-21 DIAGNOSIS — J309 Allergic rhinitis, unspecified: Secondary | ICD-10-CM | POA: Diagnosis present

## 2022-04-21 DIAGNOSIS — E871 Hypo-osmolality and hyponatremia: Secondary | ICD-10-CM | POA: Diagnosis not present

## 2022-04-21 DIAGNOSIS — S42354A Nondisplaced comminuted fracture of shaft of humerus, right arm, initial encounter for closed fracture: Secondary | ICD-10-CM

## 2022-04-21 DIAGNOSIS — E785 Hyperlipidemia, unspecified: Secondary | ICD-10-CM | POA: Diagnosis present

## 2022-04-21 DIAGNOSIS — Z79899 Other long term (current) drug therapy: Secondary | ICD-10-CM | POA: Diagnosis not present

## 2022-04-21 DIAGNOSIS — S4290XA Fracture of unspecified shoulder girdle, part unspecified, initial encounter for closed fracture: Secondary | ICD-10-CM | POA: Diagnosis present

## 2022-04-21 DIAGNOSIS — S92355A Nondisplaced fracture of fifth metatarsal bone, left foot, initial encounter for closed fracture: Secondary | ICD-10-CM | POA: Diagnosis present

## 2022-04-21 DIAGNOSIS — S4291XA Fracture of right shoulder girdle, part unspecified, initial encounter for closed fracture: Secondary | ICD-10-CM | POA: Diagnosis not present

## 2022-04-21 DIAGNOSIS — Z881 Allergy status to other antibiotic agents status: Secondary | ICD-10-CM | POA: Diagnosis not present

## 2022-04-21 DIAGNOSIS — K760 Fatty (change of) liver, not elsewhere classified: Secondary | ICD-10-CM | POA: Diagnosis present

## 2022-04-21 DIAGNOSIS — I480 Paroxysmal atrial fibrillation: Secondary | ICD-10-CM | POA: Diagnosis present

## 2022-04-21 DIAGNOSIS — Z87891 Personal history of nicotine dependence: Secondary | ICD-10-CM | POA: Diagnosis not present

## 2022-04-21 DIAGNOSIS — E872 Acidosis, unspecified: Secondary | ICD-10-CM | POA: Diagnosis not present

## 2022-04-21 DIAGNOSIS — D649 Anemia, unspecified: Secondary | ICD-10-CM | POA: Diagnosis present

## 2022-04-21 DIAGNOSIS — D72829 Elevated white blood cell count, unspecified: Secondary | ICD-10-CM | POA: Diagnosis not present

## 2022-04-21 DIAGNOSIS — R410 Disorientation, unspecified: Secondary | ICD-10-CM | POA: Diagnosis not present

## 2022-04-21 DIAGNOSIS — S42291A Other displaced fracture of upper end of right humerus, initial encounter for closed fracture: Secondary | ICD-10-CM

## 2022-04-21 DIAGNOSIS — I10 Essential (primary) hypertension: Secondary | ICD-10-CM | POA: Diagnosis present

## 2022-04-21 DIAGNOSIS — M1990 Unspecified osteoarthritis, unspecified site: Secondary | ICD-10-CM | POA: Diagnosis present

## 2022-04-21 DIAGNOSIS — Z79891 Long term (current) use of opiate analgesic: Secondary | ICD-10-CM | POA: Diagnosis not present

## 2022-04-21 DIAGNOSIS — F411 Generalized anxiety disorder: Secondary | ICD-10-CM | POA: Diagnosis present

## 2022-04-21 DIAGNOSIS — W1809XA Striking against other object with subsequent fall, initial encounter: Secondary | ICD-10-CM | POA: Diagnosis present

## 2022-04-21 DIAGNOSIS — J4489 Other specified chronic obstructive pulmonary disease: Secondary | ICD-10-CM | POA: Diagnosis present

## 2022-04-21 DIAGNOSIS — E8809 Other disorders of plasma-protein metabolism, not elsewhere classified: Secondary | ICD-10-CM | POA: Diagnosis not present

## 2022-04-21 DIAGNOSIS — E663 Overweight: Secondary | ICD-10-CM | POA: Diagnosis present

## 2022-04-21 DIAGNOSIS — R0902 Hypoxemia: Secondary | ICD-10-CM | POA: Diagnosis not present

## 2022-04-21 MED ORDER — ACETAMINOPHEN 325 MG PO TABS
650.0000 mg | ORAL_TABLET | Freq: Four times a day (QID) | ORAL | Status: DC | PRN
  Administered 2022-04-21 – 2022-04-25 (×12): 650 mg via ORAL
  Filled 2022-04-21 (×12): qty 2

## 2022-04-21 MED ORDER — MOMETASONE FURO-FORMOTEROL FUM 200-5 MCG/ACT IN AERO
2.0000 | INHALATION_SPRAY | Freq: Two times a day (BID) | RESPIRATORY_TRACT | Status: DC
  Administered 2022-04-21 – 2022-04-23 (×5): 2 via RESPIRATORY_TRACT
  Filled 2022-04-21: qty 8.8

## 2022-04-21 MED ORDER — OXYCODONE HCL 5 MG PO TABS
5.0000 mg | ORAL_TABLET | ORAL | Status: DC | PRN
  Administered 2022-04-21: 5 mg via ORAL
  Filled 2022-04-21: qty 1

## 2022-04-21 MED ORDER — MORPHINE SULFATE (PF) 2 MG/ML IV SOLN
2.0000 mg | Freq: Once | INTRAVENOUS | Status: AC
  Administered 2022-04-21: 2 mg via INTRAVENOUS
  Filled 2022-04-21: qty 1

## 2022-04-21 MED ORDER — OXYCODONE HCL 5 MG PO TABS
2.5000 mg | ORAL_TABLET | ORAL | Status: DC | PRN
  Administered 2022-04-22: 5 mg via ORAL
  Administered 2022-04-22: 2.5 mg via ORAL
  Filled 2022-04-21 (×3): qty 1

## 2022-04-21 MED ORDER — MORPHINE SULFATE (PF) 4 MG/ML IV SOLN
4.0000 mg | Freq: Once | INTRAVENOUS | Status: AC
  Administered 2022-04-21: 4 mg via INTRAVENOUS
  Filled 2022-04-21: qty 1

## 2022-04-21 NOTE — Consult Note (Signed)
ORTHOPAEDIC CONSULTATION  REQUESTING PHYSICIAN: Mariel Aloe, MD  PCP:  [name], MD  Chief Complaint: Right shoulder fracture, left foot fracture  HPI: Elizabeth Mcconnell is a 77 y.o. female with a past medical history significant for A-fib on Eliquis, diverticulosis, small bowel obstruction, and anxiety who presented to [place] after trip and fall on uneven surface.  She injured her right shoulder and left foot.  X-rays of the right shoulder showed comminuted minimally displaced right proximal humerus fracture.  X-rays of the left foot showed nondisplaced comminuted Jones fracture.  She was placed into a sling right upper extremity.  She was placed into a posterior short leg splint left lower extremity.  She was admitted to [facility] for further management.  Orthopedic consultation was placed for management of these 2 injuries.  She denies other injuries.  Patient is extremely active.  She lives by herself.  She does not use any assist device.  Past Medical History:  Diagnosis Date   Adenomatous polyp of colon 10/2013   f/u colo 10/2018   Allergic rhinitis    ALLERGIC RHINITIS    ANXIETY    "situational" (02/14/2015)   Arthritis    "neck, back, hands" (02/14/2015)   Asthma    Atrial fibrillation with RVR ([place])    Atrial flutter ([place])    failed cardioversion 11/2012; s/p ablation 02-02-2013 by Dr Rayann Heman   Chronic bronchitis ([place])    "I'll have it most years" (02/14/2015)   Diverticulosis    Endometriosis    Essential hypertension 05/30/2014   Hepatic steatosis    Oral candidiasis    Osteopenia 08/2015   T score -1.9 FRAX 16% / 6.5%   Pneumothorax on left 1996   left lower lung collapse s/p resection   Small bowel obstruction ([place])    Tobacco abuse    Past Surgical History:  Procedure Laterality Date   ABDOMINAL HYSTERECTOMY     APPENDECTOMY  1978   ATRIAL FIBRILLATION ABLATION N/A 11/05/2016   Procedure: Atrial Fibrillation Ablation;   Surgeon: Thompson Grayer, MD;  Location: [place] CV LAB;  Service: Cardiovascular;  Laterality: N/A;   ATRIAL FLUTTER ABLATION N/A 02/02/2013   Procedure: ATRIAL FLUTTER ABLATION;  Surgeon: Coralyn Mark, MD;  Location: [place] CATH LAB;  Service: Cardiovascular;  Laterality: N/A;   CARDIOVERSION N/A 11/27/2012   Procedure: CARDIOVERSION;  Surgeon: Thayer Headings, MD;  Location: [facility] ENDOSCOPY;  Service: Cardiovascular;  Laterality: N/A;   [place]; [place] N/A 05/04/2015   Procedure: LAPAROSCOPIC LOW ANTERIOR RESECTION LAPAROSCOPIC LYSIS OF ADHESIONS, SPLENIC FLEXURE MOBILIZATION;  Surgeon: Michael Boston, MD;  Location: [facility] ORS;  Service: General;  Laterality: N/A;   LUNG REMOVAL, PARTIAL Left 02/1965   LLL   TOTAL ABDOMINAL HYSTERECTOMY W/ BILATERAL SALPINGOOPHORECTOMY  1984   Endometriosis   Social History   Socioeconomic History   Marital status: Widowed    Spouse name: Not on file   Number of children: 2   Years of education: Not on file   Highest education level: Not on file  Occupational History   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 LW  Vaping Use   Vaping Use: Never used  Substance and Sexual Activity   Alcohol use: Yes    Alcohol/week: 14.0 standard drinks of alcohol    Types: 14 Standard drinks or equivalent per week    Comment: daily wine   Drug use: No   Sexual activity: Not Currently    Birth control/protection: Surgical, Post-menopausal    Comment: HYST-1st intercourse 77 yo-Fewer than 5 partners  Other Topics Concern   Not on file  Social History Narrative   Lives with spouse;    2 children      [facility]:   She is from [place].  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 Previously has attended business school. Previously did office work and Programmer, applications at [employer]. She has also worked for [employer]. She does have a cat currently. She has indoor plants. No mold exposure.   Social Determinants of Health   Financial Resource Strain: Low Risk  (08/22/2017)   Overall Financial Resource Strain (CARDIA)    Difficulty of Paying Living Expenses: Not hard at all  Food Insecurity: No Food Insecurity (04/21/2022)   Hunger Vital Sign    Worried About Running Out of Food in the Last Year: Never true    Ran Out of Food in the Last Year: Never true  Transportation Needs: No Transportation Needs (04/21/2022)   PRAPARE - Hydrologist (Medical): No    Lack of Transportation (Non-Medical): No  Physical Activity: Unknown (10/14/2018)   Exercise Vital Sign    Days of Exercise per Week: 3 days    Minutes of Exercise per Session: Not on file  Stress: No Stress Concern Present (10/14/2018)   [place]    Feeling of Stress : Only a little  Social Connections: Moderately Integrated (08/22/2017)   Social Connection and Isolation Panel [NHANES]    Frequency of Communication with Friends and Family: More than three times a week    Frequency of Social Gatherings with Friends and Family: More than three times a week    Attends Religious Services: More than 4 times per year    Active Member of [employer] or Organizations: Yes    Attends Archivist Meetings: More than 4 times per year    Marital Status: Widowed   Family History  Problem Relation Age of Onset   Asthma Maternal Grandfather    Stroke Father 18   Hypertension Father    Heart disease Father    Diabetes Mother    Asthma Child    Colon cancer Neg Hx    Rectal cancer Neg Hx    Stomach cancer Neg Hx    Allergies  Allergen Reactions   Tramadol  Other (See Comments)    Reaction:  Dizziness and weakness    Amoxicillin-Pot Clavulanate Nausea And Vomiting and Other (See Comments)    Has patient had a PCN reaction causing immediate rash, facial/tongue/throat swelling, SOB or lightheadedness with hypotension: No Has patient had a PCN reaction causing severe rash involving mucus membranes or skin necrosis: No Has patient had a PCN reaction that required hospitalization No Has patient had a PCN reaction occurring within the last 10 years: No If all of the above answers are "NO", then may proceed with Cephalosporin use.   Amoxicillin Other (See Comments)   Flagyl [Metronidazole] Nausea And Vomiting  Flecainide Other (See Comments)    Dizziness    Gabapentin     Shaking all over, fingers were numb and trouble breathing   Omnicef [Cefdinir] Nausea And Vomiting   Avelox [Moxifloxacin] Hives and Itching   Clavulanic Acid Other (See Comments)   Prior to Admission medications   Medication Sig Start Date End Date Taking? Authorizing Provider  acetaminophen (TYLENOL) 325 MG tablet Take 2 tablets (650 mg total) by mouth every 6 (six) hours as needed for up to 30 doses for moderate pain or mild pain. 04/20/22  Yes Wyvonnia Dusky, MD  acetaminophen (TYLENOL) 650 MG CR tablet Take 650 mg by mouth at bedtime.   Yes [provider]  budesonide-formoterol (SYMBICORT) 160-4.5 MCG/ACT inhaler INHALE 2 PUFFS FIRST THING in THE MORNING, THEN INHALE 2 PUFFS 12 HOURS LATER Patient taking differently: Inhale 2 puffs into the lungs. INHALE 2 PUFFS FIRST THING in THE MORNING, THEN INHALE 2 PUFFS 12 HOURS LATER 12/31/21  Yes Tanda Rockers, MD  busPIRone (BUSPAR) 10 MG tablet Take 10 mg by mouth 2 (two) times daily. 07/27/21  Yes [provider]  BYSTOLIC 5 MG tablet TAKE 1 TABLET BY MOUTH EVERY DAY 09/17/19  Yes Janith Lima, MD  oxyCODONE (ROXICODONE) 5 MG immediate release tablet Take 1 tablet (5 mg total) by mouth every 8 (eight) hours  as needed for up to 15 doses for severe pain. 04/20/22  Yes Trifan, Carola Rhine, MD  ALPRAZolam Duanne Moron) 0.25 MG tablet TAKE 1 TABLET BY MOUTH AT BEDTIME AS NEEDED FOR ANXIETY OR SLEEP Patient taking differently: 0.125 mg at bedtime as needed for sleep. 08/15/19   Janith Lima, MD  amLODipine (NORVASC) 2.5 MG tablet TAKE 1 TABLET BY MOUTH EVERY DAY Patient taking differently: Take 2.5 mg by mouth daily. 11/13/21   Sherran Needs, NP  Calcium Carb-Cholecalciferol 600-800 MG-UNIT TABS Take 1 tablet by mouth daily.    [provider]  dofetilide (TIKOSYN) 250 MCG capsule TAKE 1 CAPSULE BY MOUTH 2 TIMES DAILY 02/07/22   Sherran Needs, NP  ELIQUIS 5 MG TABS tablet TAKE 1 TABLET BY MOUTH 2 TIMES DAILY 01/21/22   Allred, Jeneen Rinks, MD  fluticasone [facility]) 50 MCG/ACT nasal spray USE 2 SPRAYS IN [facility] NOSTRIL 2 TIMES DAILY 02/12/22   Tanda Rockers, MD  levalbuterol [facility] HFA) 45 MCG/ACT inhaler inhale 1-2 PUFFS INTO THE LUNGS EVERY 4 HOURS AS NEEDED 04/04/22   Tanda Rockers, MD  losartan (COZAAR) 100 MG tablet TAKE 1 TABLET BY MOUTH EVERY DAY 01/21/22   Sherran Needs, NP  magnesium oxide (MAG-OX) 400 MG tablet Take 1 tablet (400 mg total) by mouth daily. 02/13/15   Sherren Mocha, MD  mirtazapine (REMERON) 15 MG tablet Take 15 mg by mouth at bedtime. 07/27/21   [provider]  montelukast (SINGULAIR) 10 MG tablet TAKE 1 TABLET BY MOUTH EVERY DAY 08/08/21   Tanda Rockers, MD  potassium chloride (KLOR-CON) 10 MEQ tablet TAKE 2 TABLETS BY MOUTH EVERY DAY 08/06/21   Allred, Jeneen Rinks, MD  pravastatin (PRAVACHOL) 40 MG tablet Take 1 tablet by mouth daily. 07/04/20   [provider]  traZODone (DESYREL) 50 MG tablet Take 50-100 mg by mouth at bedtime as needed for sleep. 02/07/22   [provider]   DG Chest Portable 1 View  Result Date: 04/21/2022 CLINICAL DATA:  low oxygen sats EXAM: PORTABLE CHEST 1 VIEW.  Patient is rotated. COMPARISON:  Chest x-ray 12/31/2021 FINDINGS: The  heart and mediastinal contours are grossly unchanged evaluation due to patient rotation. Aortic calcification. Possible hazy airspace opacity at the left base and possible pleural effusion. No pulmonary edema. No right pleural effusion. No pneumothorax. No acute osseous abnormality. IMPRESSION: 1. Possible hazy airspace opacity at the left base and possible left pleural effusion with limited evaluation due to patient rotation. Recommend repeat PA and lateral view of the chest. 2.  Aortic Atherosclerosis (ICD10-I70.0). Electronically Signed   By: Iven Finn M.D.   On: 04/21/2022 00:14   DG Toe 5th Left  Result Date: 04/20/2022 CLINICAL DATA:  Fifth toe pain with known fifth metatarsal fracture. EXAM: DG TOE 5TH LEFT COMPARISON:  Films from earlier in the same day. FINDINGS: There is a lucency along the lateral aspect of the distal fifth proximal phalanx suspicious for undisplaced fracture. No other fracture seen. The known proximal fifth metatarsal fracture is not appreciated on this exam. IMPRESSION: Lucency in the fifth proximal phalanx distally suspicious for undisplaced fracture. Electronically Signed   By: Inez Catalina M.D.   On: 04/20/2022 18:46   DG Foot 2 Views Left  Result Date: 04/20/2022 CLINICAL DATA:  a tripped and fell. Lateral foot pain. Patient on Eliquis. EXAM: LEFT FOOT - 2 VIEW COMPARISON:  None Available. FINDINGS: Fracture, nondisplaced, across the base of the fifth metatarsal, predominantly across the metaphysis, a Jones fracture. No other fracture.  No bone lesion. Asymmetric joint space narrowing of the first metatarsal phalangeal joint. Remaining joints are normally spaced and aligned. Small dorsal and plantar calcaneal spurs. Mild lateral soft tissue swelling. IMPRESSION: 1. Nondisplaced fracture across the proximal fifth metatarsal, across the metaphysis. 2. No other fractures.  No dislocation. Electronically Signed   By: Lajean Manes M.D.   On: 04/20/2022 17:38   DG  Shoulder Right  Result Date: 04/20/2022 CLINICAL DATA:  Tripped and fell.  Shoulder pain EXAM: RIGHT SHOULDER - 2+ VIEW COMPARISON:  None Available. FINDINGS: Comminuted fracture of the RIGHT humeral head. Impaction type fracture. Avulsion of the greater tuberosity. Subluxation of the humeral head in the glenohumeral joint inferiorly suggest hemarthrosis. IMPRESSION: 1. Mildly comminuted impaction fracture of the RIGHT humeral head with avulsion of the greater tuberosity. 2. Hemarthrosis with inferior subluxation. Electronically Signed   By: Suzy Bouchard M.D.   On: 04/20/2022 17:31    Positive ROS: All other systems have been reviewed and were otherwise negative with the exception of those mentioned in the HPI and as above.  Physical Exam: General: Alert, no acute distress Cardiovascular: No pedal edema Respiratory: No cyanosis, no use of accessory musculature GI: No organomegaly, abdomen is soft and non-tender Skin: No lesions in the area of chief complaint Neurologic: Sensation intact distally Psychiatric: Patient is competent for consent with normal mood and affect Lymphatic: No axillary or cervical lymphadenopathy  MUSCULOSKELETAL:   RUE: Examination of the right shoulder reveals no skin wounds or lesions.  She has significant ecchymosis and swelling to the brachium.  No deformity.  Positive motor function AIN, PIN, and ulnar motor nerve.  2+ radial pulse.  Intact sensation axillary, musculocutaneous, radial, ulnar, and median distributions.  LLE: Short leg splint is in place.  She has a palpable pedal pulse.  She is able to wiggle her toes.  Assessment: Right proximal humerus fracture, amenable to conservative treatment. Left Jones fracture.  Plan: I discussed the findings with the patient and her son.  Her right proximal humerus fracture is amenable to conservative treatment.  Plan for sling, nonweightbearing right  upper extremity.  With regards to her left foot, we will have one  of our foot and ankle specialists weigh-in for definitive treatment.  She may have diet for now.  Hold Eliquis.  Further recommendations to follow.   Bertram Savin, MD [phone]    04/21/2022 11:41 AM

## 2022-04-21 NOTE — H&P (Signed)
History and Physical    Elizabeth Mcconnell [id number] DOB: [birth year] DOA: 04/20/2022  PCP: Elizabeth Bowen, MD   Chief Complaint: fall  HPI: Elizabeth Mcconnell is a 77 y.o. female with medical history significant of with history of A-fib on Eliquis, anxiety, small bowel obstruction, diverticulosis who presented to [facility] with a fall.  Patient was at a performance for her grandchildren when she fell on uneven surface.  She landed on her right shoulder and was unable to move it.  She has been unable to walk on her foot.  She presented to outside ER where she was found to have tenderness of her proximal humerus and fifth metatarsal.  She was given pain medications.  Chest x-ray was obtained which showed hazy opacity at left lung base.  X-ray left toes showed nondisplaced fracture of proximal fifth metatarsal.  Right shoulder showed mildly commuted impaction fracture of right humeral head.  Patient was transferred to [facility] for orthopedic evaluation.  On evaluation patient was worsening pain is very anxious about missing a party at her house.  She was reassured and given pain medications.  Review of Systems: Review of Systems  All other systems reviewed and are negative.    As per HPI otherwise 10 point review of systems negative.   Allergies  Allergen Reactions   Tramadol Other (See Comments)    Reaction:  Dizziness and weakness    Amoxicillin-Pot Clavulanate Nausea And Vomiting and Other (See Comments)    Has patient had a PCN reaction causing immediate rash, facial/tongue/throat swelling, SOB or lightheadedness with hypotension: No Has patient had a PCN reaction causing severe rash involving mucus membranes or skin necrosis: No Has patient had a PCN reaction that required hospitalization No Has patient had a PCN reaction occurring within the last 10 years: No If all of the above answers are "NO", then may proceed with Cephalosporin use.   Amoxicillin Other (See Comments)    Flagyl [Metronidazole] Nausea And Vomiting   Flecainide Other (See Comments)    Dizziness    Gabapentin     Shaking all over, fingers were numb and trouble breathing   Omnicef [Cefdinir] Nausea And Vomiting   Avelox [Moxifloxacin] Hives and Itching   Clavulanic Acid Other (See Comments)    Past Medical History:  Diagnosis Date   Adenomatous polyp of colon 10/2013   f/u colo 10/2018   Allergic rhinitis    ALLERGIC RHINITIS    ANXIETY    "situational" (02/14/2015)   Arthritis    "neck, back, hands" (02/14/2015)   Asthma    Atrial fibrillation with RVR ([place])    Atrial flutter ([place])    failed cardioversion 11/2012; s/p ablation 02-02-2013 by Dr Rayann Heman   Chronic bronchitis ([place])    "I'll have it most years" (02/14/2015)   Diverticulosis    Endometriosis    Essential hypertension 05/30/2014   Hepatic steatosis    Oral candidiasis    Osteopenia 08/2015   T score -1.9 FRAX 16% / 6.5%   Pneumothorax on left 1996   left lower lung collapse s/p resection   Small bowel obstruction ([place])    Tobacco abuse     Past Surgical History:  Procedure Laterality Date   ABDOMINAL HYSTERECTOMY     APPENDECTOMY  1978   ATRIAL FIBRILLATION ABLATION N/A 11/05/2016   Procedure: Atrial Fibrillation Ablation;  Surgeon: Thompson Grayer, MD;  Location: [place] CV LAB;  Service: Cardiovascular;  Laterality: N/A;   ATRIAL FLUTTER ABLATION N/A 02/02/2013   Procedure:  ATRIAL FLUTTER ABLATION;  Surgeon: Coralyn Mark, MD;  Location: [place] CATH LAB;  Service: Cardiovascular;  Laterality: N/A;   CARDIOVERSION N/A 11/27/2012   Procedure: CARDIOVERSION;  Surgeon: Thayer Headings, MD;  Location: [facility] ENDOSCOPY;  Service: Cardiovascular;  Laterality: N/A;   [place]; [place] N/A 05/04/2015   Procedure: LAPAROSCOPIC LOW ANTERIOR RESECTION LAPAROSCOPIC LYSIS OF ADHESIONS, SPLENIC FLEXURE MOBILIZATION;  Surgeon: Michael Boston, MD;  [facility];  Service: General;  Laterality:  N/A;   LUNG REMOVAL, PARTIAL Left 02/1965   LLL   TOTAL ABDOMINAL HYSTERECTOMY W/ BILATERAL SALPINGOOPHORECTOMY  1984   Endometriosis     reports that she quit smoking about 6 months ago. Her smoking use included cigarettes. She smoked an average of .25 packs per day. She has never used smokeless tobacco. She reports current alcohol use of about 14.0 standard drinks of alcohol per week. She reports that she does not use drugs.  Family History  Problem Relation Age of Onset   Asthma Maternal Grandfather    Stroke Father 70   Hypertension Father    Heart disease Father    Diabetes Mother    Asthma Child    Colon cancer Neg Hx    Rectal cancer Neg Hx    Stomach cancer Neg Hx     Prior to Admission medications   Medication Sig Start Date End Date Taking? Authorizing Provider  acetaminophen (TYLENOL) 325 MG tablet Take 2 tablets (650 mg total) by mouth every 6 (six) hours as needed for up to 30 doses for moderate pain or mild pain. 04/20/22  Yes Trifan, Carola Rhine, MD  oxyCODONE (ROXICODONE) 5 MG immediate release tablet Take 1 tablet (5 mg total) by mouth every 8 (eight) hours as needed for up to 15 doses for severe pain. 04/20/22  Yes Wyvonnia Dusky, MD  acetaminophen (TYLENOL) 650 MG CR tablet Take 650 mg by mouth at bedtime.    [provider]  ALPRAZolam Duanne Moron) 0.25 MG tablet TAKE 1 TABLET BY MOUTH AT BEDTIME AS NEEDED FOR ANXIETY OR SLEEP 08/15/19   Janith Lima, MD  amLODipine (NORVASC) 2.5 MG tablet TAKE 1 TABLET BY MOUTH EVERY DAY 11/13/21   Sherran Needs, NP  budesonide-formoterol (SYMBICORT) 160-4.5 MCG/ACT inhaler INHALE 2 PUFFS FIRST THING in THE MORNING, THEN INHALE 2 PUFFS 12 HOURS LATER 12/31/21   Tanda Rockers, MD  busPIRone (BUSPAR) 10 MG tablet Take 10 mg by mouth 2 (two) times daily. 07/27/21   [provider]  BYSTOLIC 5 MG tablet TAKE 1 TABLET BY MOUTH EVERY DAY 09/17/19   Janith Lima, MD  Calcium Carb-Cholecalciferol 600-800 MG-UNIT TABS  Take 1 tablet by mouth daily.    [provider]  dofetilide (TIKOSYN) 250 MCG capsule TAKE 1 CAPSULE BY MOUTH 2 TIMES DAILY 02/07/22   Sherran Needs, NP  ELIQUIS 5 MG TABS tablet TAKE 1 TABLET BY MOUTH 2 TIMES DAILY 01/21/22   Allred, Jeneen Rinks, MD  fluticasone [facility]) 50 MCG/ACT nasal spray USE 2 SPRAYS IN [facility] NOSTRIL 2 TIMES DAILY 02/12/22   Tanda Rockers, MD  levalbuterol [facility] HFA) 45 MCG/ACT inhaler inhale 1-2 PUFFS INTO THE LUNGS EVERY 4 HOURS AS NEEDED 04/04/22   Tanda Rockers, MD  losartan (COZAAR) 100 MG tablet TAKE 1 TABLET BY MOUTH EVERY DAY 01/21/22   Sherran Needs, NP  magnesium oxide (MAG-OX) 400 MG tablet Take 1 tablet (400 mg total) by mouth daily.  02/13/15   Sherren Mocha, MD  mirtazapine (REMERON) 15 MG tablet Take 15 mg by mouth at bedtime. 07/27/21   [provider]  montelukast (SINGULAIR) 10 MG tablet TAKE 1 TABLET BY MOUTH EVERY DAY 08/08/21   Tanda Rockers, MD  potassium chloride (KLOR-CON) 10 MEQ tablet TAKE 2 TABLETS BY MOUTH EVERY DAY 08/06/21   Allred, Jeneen Rinks, MD  pravastatin (PRAVACHOL) 40 MG tablet Take 1 tablet by mouth daily. 07/04/20   [provider]  traZODone (DESYREL) 50 MG tablet Take 50-100 mg by mouth at bedtime as needed for sleep. 02/07/22   [provider]    Physical Exam: Vitals:   04/21/22 0135 04/21/22 0200 04/21/22 0245 04/21/22 0341  BP:  (!) 146/78 134/76 134/69  Pulse:  73 78 74  Resp:  17 17 18  $ Temp: 99 F (37.2 C)   99 F (37.2 C)  TempSrc: Oral   Axillary  SpO2:  94% 96% 92%  Weight:    72.3 kg  Height:    5' 5"$  (1.651 m)   Physical Exam Vitals reviewed.  Constitutional:      Appearance: Normal appearance. She is normal weight.  HENT:     Nose: Nose normal.     Mouth/Throat:     Mouth: Mucous membranes are moist.  Eyes:     Pupils: Pupils are equal, round, and reactive to light.  Cardiovascular:     Rate and Rhythm: Normal rate and regular rhythm.     Pulses: Normal pulses.   Pulmonary:     Effort: Pulmonary effort is normal.  Abdominal:     General: Abdomen is flat. Bowel sounds are normal.  Musculoskeletal:        General: Tenderness and signs of injury present.     Cervical back: Normal range of motion.  Skin:    General: Skin is warm.     Capillary Refill: Capillary refill takes less than 2 seconds.  Neurological:     General: No focal deficit present.     Mental Status: She is alert.      Labs on Admission: I have personally reviewed the patients's labs and imaging studies.  Assessment/Plan Principal Problem:   Humerus head fracture Active Problems:   Shoulder fracture -After mechanical fall landing on right side -No history of prior fracture  Plan: Orthopedics consulted and will evaluate in the morning Remain n.p.o. Hold Eliquis As needed morphine and oxy for pain  # Generalized anxiety-continue Xanax, BuSpar  Hypertension-continue Norvasc, continue Bystolic, losartan  #Hyperlipidemia-continue pravastatin  #Paroxysmal A-fib-continue Tikosyn, hold Eliquis in anticipation of possible surgical intervention   Admission status: Observation Med-Surg  Certification: The appropriate patient status for this patient is OBSERVATION. Observation status is judged to be reasonable and necessary in order to provide the required intensity of service to ensure the patient's safety. The patient's presenting symptoms, physical exam findings, and initial radiographic and laboratory data in the context of their medical condition is felt to place them at decreased risk for further clinical deterioration. Furthermore, it is anticipated that the patient will be medically stable for discharge from the hospital within 2 midnights of admission.     Emilee Hero MD [facility] If 7PM-7AM, please contact night-coverage [email or web address]  04/21/2022, 6:09 AM

## 2022-04-21 NOTE — Progress Notes (Signed)
Patient arrived to unit via [facility]. AxO x4. Placed on telemetry. All questions answered. Patient declines family notification as she states she will call her son later. [facility] admits paged.

## 2022-04-21 NOTE — ED Notes (Signed)
[facility] at bedside for transport. Pt complaining of pain to R arm, MD notified and order received, see MAR.

## 2022-04-21 NOTE — ED Notes (Signed)
Report called to Elmo Putt, RN for [id number].

## 2022-04-21 NOTE — Hospital Course (Signed)
Elizabeth Mcconnell is a 77 y.o. female with a history of atrial fibrillation on Eliquis and Tikosyn, anxiety, SBO, diverticulosis. Patient presented secondary to a fall, suffering right humerus and left fifth proximal metatarsal fractures. Orthopedic surgery consulted. Sling applied to right arm; non-weight bearing.

## 2022-04-21 NOTE — Plan of Care (Signed)
[facility] will assume care on arrival to accepting facility. Until arrival, care as per EDP. However, [facility] available 24/7 for questions and assistance.  Nursing staff, please page [facility] Admits and Consults ([phone]) as soon as the patient arrives to the hospital.   

## 2022-04-21 NOTE — Progress Notes (Signed)
PROGRESS NOTE    Elizabeth Mcconnell  [id number] DOB: 1945-08-01 DOA: 04/20/2022 PCP: [name], MD   Brief Narrative: Elizabeth Mcconnell is a 77 y.o. female with a history of atrial fibrillation on Eliquis and Tikosyn, anxiety, SBO, diverticulosis. Patient presented secondary to a fall, suffering right humerus and left fifth proximal metatarsal fractures. Orthopedic surgery consulted. Sling applied to right arm; non-weight bearing.   Assessment and Plan:  Right humerus head fracture Secondary to fall. Orthopedic surgery consulted and recommend non-operative management, a sling and non-weight bearing status of RUE. -PT/OT pending management of foot fracture -Adjust to oxycodone 2.5-5 mg PRN  Left fifth proximal metatarsal fracture Secondary to fall. Short leg splint applied in the ED. Orthopedic surgery consulted and recommended likely operative management.  Hypoxia Asymptomatic. Likely secondary to opioid analgesics and respiratory depression. Supplemental oxygen applied. -Wean to room air -Minimize narcotics  Generalized anxiety -Continue Buspar -Continue Xanax PRN  Primary hypertension -Continue amlodipine, losartan and nebivolol  Hyperlipidemia -Continue pravastatin  Paroxysmal atrial fibrillation Eliquis held secondary to need for surgery this admission. -Continue Tikosyn and nebivolol   DVT prophylaxis: SCDs Code Status:   Code Status: Full Code Family Communication: Son at bedside Disposition Plan: Discharge home vs SNF pending continued orthopedic surgery management/recommendations   Consultants:  Orthopedic surgery  Procedures:  None  Antimicrobials: None    Subjective: Patient reports continued pain. No other concerns.  Objective: BP 119/64 (BP Location: Left Arm)   Pulse 73   Temp 98.7 F (37.1 C) (Oral)   Resp 19   Ht 5' 5"$  (1.651 m)   Wt 72.3 kg   SpO2 93%   BMI 26.53 kg/m   Examination:  General exam: Appears calm  and comfortable Respiratory system: Clear to auscultation. Respiratory effort normal. Cardiovascular system: S1 & S2 heard. Gastrointestinal system: Abdomen is nondistended, soft and nontender. No organomegaly or masses felt. Normal bowel sounds heard. Central nervous system: Lethargic but does arouse. No focal neurological deficits. Musculoskeletal: No edema. No calf tenderness. Left foot/ankle in splint. Right upper extremity in sling. Skin: No cyanosis. No rashes Psychiatry: Judgement and insight appear normal. Mood & affect appropriate.    Data Reviewed: I have personally reviewed following labs and imaging studies  CBC Lab Results  Component Value Date   WBC 14.8 (H) 04/20/2022   RBC 4.42 04/20/2022   HGB 13.0 04/20/2022   HCT 39.7 04/20/2022   MCV 89.8 04/20/2022   MCH 29.4 04/20/2022   PLT 175 04/20/2022   MCHC 32.7 04/20/2022   RDW 13.5 04/20/2022   LYMPHSABS 0.3 (L) 06/30/2021   MONOABS 0.3 06/30/2021   EOSABS 0.0 06/30/2021   BASOSABS 0.0 0000000     Last metabolic panel Lab Results  Component Value Date   NA 136 04/20/2022   K 4.3 04/20/2022   CL 104 04/20/2022   CO2 22 04/20/2022   BUN 11 04/20/2022   CREATININE 0.72 04/20/2022   GLUCOSE 120 (H) 04/20/2022   GFRNONAA >60 04/20/2022   GFRAA 76 06/24/2019   CALCIUM 9.8 04/20/2022   PHOS 2.8 05/08/2015   PROT 7.3 07/26/2019   ALBUMIN 4.6 07/26/2019   BILITOT 0.9 07/26/2019   ALKPHOS 87 07/26/2019   AST 25 07/26/2019   ALT 27 07/26/2019   ANIONGAP 10 04/20/2022    GFR: Estimated Creatinine Clearance: 57.7 mL/min (by C-G formula based on SCr of 0.72 mg/dL).  No results found for this or any previous visit (from the past 240 hour(s)).    Radiology Studies:  DG Chest Portable 1 View  Result Date: 04/21/2022 CLINICAL DATA:  low oxygen sats EXAM: PORTABLE CHEST 1 VIEW.  Patient is rotated. COMPARISON:  Chest x-ray 12/31/2021 FINDINGS: The heart and mediastinal contours are grossly unchanged  evaluation due to patient rotation. Aortic calcification. Possible hazy airspace opacity at the left base and possible pleural effusion. No pulmonary edema. No right pleural effusion. No pneumothorax. No acute osseous abnormality. IMPRESSION: 1. Possible hazy airspace opacity at the left base and possible left pleural effusion with limited evaluation due to patient rotation. Recommend repeat PA and lateral view of the chest. 2.  Aortic Atherosclerosis (ICD10-I70.0). Electronically Signed   By: Iven Finn M.D.   On: 04/21/2022 00:14   DG Toe 5th Left  Result Date: 04/20/2022 CLINICAL DATA:  Fifth toe pain with known fifth metatarsal fracture. EXAM: DG TOE 5TH LEFT COMPARISON:  Films from earlier in the same day. FINDINGS: There is a lucency along the lateral aspect of the distal fifth proximal phalanx suspicious for undisplaced fracture. No other fracture seen. The known proximal fifth metatarsal fracture is not appreciated on this exam. IMPRESSION: Lucency in the fifth proximal phalanx distally suspicious for undisplaced fracture. Electronically Signed   By: Inez Catalina M.D.   On: 04/20/2022 18:46   DG Foot 2 Views Left  Result Date: 04/20/2022 CLINICAL DATA:  a tripped and fell. Lateral foot pain. Patient on Eliquis. EXAM: LEFT FOOT - 2 VIEW COMPARISON:  None Available. FINDINGS: Fracture, nondisplaced, across the base of the fifth metatarsal, predominantly across the metaphysis, a Jones fracture. No other fracture.  No bone lesion. Asymmetric joint space narrowing of the first metatarsal phalangeal joint. Remaining joints are normally spaced and aligned. Small dorsal and plantar calcaneal spurs. Mild lateral soft tissue swelling. IMPRESSION: 1. Nondisplaced fracture across the proximal fifth metatarsal, across the metaphysis. 2. No other fractures.  No dislocation. Electronically Signed   By: Lajean Manes M.D.   On: 04/20/2022 17:38   DG Shoulder Right  Result Date: 04/20/2022 CLINICAL DATA:   Tripped and fell.  Shoulder pain EXAM: RIGHT SHOULDER - 2+ VIEW COMPARISON:  None Available. FINDINGS: Comminuted fracture of the RIGHT humeral head. Impaction type fracture. Avulsion of the greater tuberosity. Subluxation of the humeral head in the glenohumeral joint inferiorly suggest hemarthrosis. IMPRESSION: 1. Mildly comminuted impaction fracture of the RIGHT humeral head with avulsion of the greater tuberosity. 2. Hemarthrosis with inferior subluxation. Electronically Signed   By: Suzy Bouchard M.D.   On: 04/20/2022 17:31      LOS: 1 day    Cordelia Poche, MD [facility] 04/21/2022, 12:47 PM   If 7PM-7AM, please contact night-coverage [email or web address]

## 2022-04-21 NOTE — Care Plan (Addendum)
Orthopaedic Surgery Plan of Care Note  -received consult via colleague on call -pt has left fifth metatarsal base fx sustained 04/20/22 with concurrent right humerus head fx -CT left foot reviewed showing comminuted, complex fx without displacement of the proximal left fifth metatarsal -this could potentially be managed non-operatively especially in light of patient's right shoulder injury as well as cardiac issues and ongoing anticoagulation -in light of multiple orthopaedic issues & balance concerns, will plan on touchdown/heel weightbearing in postop shoe LLE with walker at all times. Please remove splint and order postop shoe for whenever patient is out of bed. Will potentially advance weightbearing in outpatient setting once weightbearing films have been obtained -continue anticoagulation, medical and pain management per primary team -advise PT/OT to assist with balance, gait training and maintaining above restrictions -follow up with [facility] in clinic [phone] within 1 wk from discharge  Armond Hang, MD Orthopaedic Surgery [facility]

## 2022-04-22 ENCOUNTER — Inpatient Hospital Stay (HOSPITAL_COMMUNITY): Payer: Medicare PPO

## 2022-04-22 DIAGNOSIS — S92355A Nondisplaced fracture of fifth metatarsal bone, left foot, initial encounter for closed fracture: Secondary | ICD-10-CM | POA: Diagnosis not present

## 2022-04-22 DIAGNOSIS — S42291A Other displaced fracture of upper end of right humerus, initial encounter for closed fracture: Secondary | ICD-10-CM | POA: Diagnosis not present

## 2022-04-22 MED ORDER — APIXABAN 5 MG PO TABS
5.0000 mg | ORAL_TABLET | Freq: Two times a day (BID) | ORAL | Status: DC
  Administered 2022-04-22 – 2022-04-26 (×8): 5 mg via ORAL
  Filled 2022-04-22 (×9): qty 1

## 2022-04-22 MED ORDER — LEVALBUTEROL HCL 0.63 MG/3ML IN NEBU: 0.6300 mg | INHALATION_SOLUTION | Freq: Four times a day (QID) | RESPIRATORY_TRACT | Status: DC | PRN

## 2022-04-22 NOTE — Progress Notes (Signed)
Subjective:     Patient reports left foot pain as appropriately controlled. Denies any new numbness/tingling.   Objective:   VITALS:  Temp:  [98.1 F (36.7 C)-98.9 F (37.2 C)] 98.2 F (36.8 C) (02/19 0945) Pulse Rate:  [76-78] 78 (02/19 0945) Resp:  [16-20] 19 (02/18 2300) BP: (125-155)/(68-79) 155/76 (02/19 0945) SpO2:  [90 %-94 %] 90 % (02/19 0945)  Gen: AAOx3, NAD Comfortable at rest  Left Lower Extremity: Skin intact, extensive ecchymoses without blistering TTP over left fifth metatarsal base ADF/APF/EHL 5/5 SILT throughout DP, PT 2+ to palp CR < 2s    LABS Recent Labs    04/20/22 0810  HGB 13.0  WBC 14.8*  PLT 175   Recent Labs    04/20/22 0810  NA 136  K 4.3  CL 104  CO2 22  BUN 11  CREATININE 0.72  GLUCOSE 120*   No results for input(s): "LABPT", "INR" in the last 72 hours.   Assessment/Plan: Ortho consult for left fifth metatarsal base fx sustained 04/20/22 with concurrent right humerus head fx   -history, exam and imaging reviewed at length with the patient  -CT left foot reviewed showing comminuted, complex fx without displacement of the proximal left fifth metatarsal -this could potentially be managed non-operatively especially in light of patient's right shoulder injury as well as cardiac issues and ongoing anticoagulation -in light of multiple orthopaedic issues & balance concerns, will plan on heel weightbearing in postop shoe LLE at all times. Will potentially advance weightbearing in outpatient setting once weightbearing films have been obtained -I have removed splint and ordered postop shoe for whenever patient is out of bed -continue anticoagulation, medical and pain management per primary team -advise PT/OT to assist with balance, gait training and maintaining above restrictions -follow up with [facility] in clinic [phone] within 1 wk from discharge  Armond Hang 04/22/2022, 10:19 AM

## 2022-04-22 NOTE — Progress Notes (Signed)
Inpatient Rehab Admissions Coordinator Note:   Per therapy recommendations patient was screened for CIR candidacy by Michel Santee, PT. At this time, pt appears to be a potential candidate for CIR. I will place an order for rehab consult for full assessment, per our protocol.  Please contact me any with questions.Shann Medal, PT, DPT [phone] 04/22/22 3:23 PM

## 2022-04-22 NOTE — Progress Notes (Signed)
Second attempt to contact MD regarding patient and family concerns regarding seeing the patient and family concerns for placement after discharge from home.

## 2022-04-22 NOTE — Evaluation (Signed)
Physical Therapy Evaluation Patient Details Name: Elizabeth Mcconnell MRN: [id number] DOB: January 19, 1946 Today's Date: 04/22/2022  History of Present Illness  Elizabeth Mcconnell is a 77 y.o. female presents after trip and fall on uneven surface. Pt with R humerus head fracture and L 5th proximal metatarsal fracture. PMH: A-fib, diverticulosis, small bowel obstruction, HTN, osteopenia  Clinical Impression  Pt admitted with above diagnosis. Pt ind at baseline without DME at home, navigating 3 steps to enter home without difficulty. Pt with recent fall and now RUE NWB in sling and LLE TDWB in post-op shoe, needing max A to come to sitting EOB due to inability to use RUE and pain in RUE. Pt needs min A+2 for STS to EOB, upon standing pt verbalizes fear of falling needing encouragement and cues, ultimately requests to return to sitting. Pt able to perform 2nd STS with min A+2 for safety and able to clear L foot from floor in static march, appears to respect TDWB, unable to clear R foot from floor. Assisted pt back to supine with min A to lift BLE back into bed. Pt not on O2 at baseline, desats to 85% on RA, returned 2L and SpO2 93%- notified RN. Educated pt on time OOB and weight-bearing restrictions. Recommend CIR, pt with family support locally and Ind at baseline. Pt currently with functional limitations due to the deficits listed below (see PT Problem List). Pt will benefit from skilled PT to increase their independence and safety with mobility to allow discharge to the venue listed below.          Recommendations for follow up therapy are one component of a multi-disciplinary discharge planning process, led by the attending physician.  Recommendations may be updated based on patient status, additional functional criteria and insurance authorization.  Follow Up Recommendations Acute inpatient rehab (3hours/day)      Assistance Recommended at Discharge Frequent or constant Supervision/Assistance   Patient can return home with the following  A lot of help with walking and/or transfers;A lot of help with bathing/dressing/bathroom;Assistance with cooking/housework;Assist for transportation;Help with stairs or ramp for entrance    Equipment Recommendations Other (comment) (defer to next venue)  Recommendations for Other Services       Functional Status Assessment Patient has had a recent decline in their functional status and demonstrates the ability to make significant improvements in function in a reasonable and predictable amount of time.     Precautions / Restrictions Precautions Precautions: Fall Required Braces or Orthoses: Sling;Other Brace Other Brace: RUE in sling, L foot in post-op shoe Restrictions Weight Bearing Restrictions: Yes RUE Weight Bearing: Non weight bearing LLE Weight Bearing: Touchdown weight bearing      Mobility  Bed Mobility Overal bed mobility: Needs Assistance Bed Mobility: Supine to Sit, Sit to Supine  Supine to sit: Max assist Sit to supine: Min assist  General bed mobility comments: max A to upright trunk into sitting secondary to pain in R UE and inability to use, min A to lift BLE back into bed and reposition to comfort, increased time    Transfers Overall transfer level: Needs assistance Equipment used: Quad cane Transfers: Sit to/from Stand Sit to Stand: Min assist, +2 safety/equipment  General transfer comment: min A+2 for safety for 2 reps, pt verbalizes fear of falling, appears to maintain TDWB on LLE with post-op shoe on and able to lift L foot from floor, unable to clear R foot from floor    Ambulation/Gait  General Gait Details: unable  Stairs  Wheelchair Mobility    Modified Rankin (Stroke Patients Only)       Balance Overall balance assessment: Needs assistance Sitting-balance support: Feet supported, Single extremity supported Sitting balance-Leahy Scale: Fair Sitting balance - Comments: seated  EOB   Standing balance support: Reliant on assistive device for balance, During functional activity, Single extremity supported Standing balance-Leahy Scale: Poor Standing balance comment: quad cane in LUE       Pertinent Vitals/Pain Pain Assessment Pain Assessment: Faces Faces Pain Scale: Hurts whole lot Pain Location: R UE Pain Descriptors / Indicators: Discomfort, Grimacing Pain Intervention(s): Limited activity within patient's tolerance, Monitored during session, Premedicated before session, Repositioned    Home Living Family/patient expects to be discharged to:: Private residence Living Arrangements: Alone Available Help at Discharge: Family;Available PRN/intermittently;Friend(s) Type of Home: House Home Access: Stairs to enter Entrance Stairs-Rails: Right;Left Entrance Stairs-Number of Steps: 3   Home Layout: Two level;Able to live on main level with bedroom/bathroom Home Equipment: None      Prior Function Prior Level of Function : Independent/Modified Independent;Driving   Mobility Comments: pt reports ind ADLs Comments: pt reports ind     Hand Dominance   Dominant Hand: Right    Extremity/Trunk Assessment   Upper Extremity Assessment Upper Extremity Assessment: Defer to OT evaluation    Lower Extremity Assessment Lower Extremity Assessment: RLE deficits/detail;LLE deficits/detail RLE Deficits / Details: hip and knee WFL, able to wiggle toes, ankle NT due to pain RLE Sensation: WNL RLE Coordination: WNL LLE Deficits / Details: AROM WFL, strength grossly 3+/5 LLE Sensation: WNL LLE Coordination: WNL    Cervical / Trunk Assessment Cervical / Trunk Assessment: Normal  Communication   Communication: No difficulties  Cognition Arousal/Alertness: Awake/alert Behavior During Therapy: WFL for tasks assessed/performed, Anxious Overall Cognitive Status: Within Functional Limits for tasks assessed  General Comments: pt verbalizes anxiousness regarding  moblizing, therapist encouraging pt on slow deep breathing and attention to task, able to follow commands        General Comments General comments (skin integrity, edema, etc.): SpO2 85% on RA, improves to 93% on 2L O2; HR in 80s    Exercises     Assessment/Plan    PT Assessment Patient needs continued PT services  PT Problem List Decreased strength;Decreased range of motion;Decreased activity tolerance;Decreased balance;Decreased mobility;Decreased coordination;Decreased cognition;Decreased knowledge of use of DME;Decreased safety awareness;Decreased knowledge of precautions;Cardiopulmonary status limiting activity;Pain       PT Treatment Interventions DME instruction;Gait training;Functional mobility training;Therapeutic activities;Therapeutic exercise;Balance training;Patient/family education    PT Goals (Current goals can be found in the Care Plan section)  Acute Rehab PT Goals Patient Stated Goal: "feel better" PT Goal Formulation: With patient Time For Goal Achievement: 05/06/22 Potential to Achieve Goals: Good    Frequency Min 3X/week     Co-evaluation               AM-PAC PT "6 Clicks" Mobility  Outcome Measure Help needed turning from your back to your side while in a flat bed without using bedrails?: A Little Help needed moving from lying on your back to sitting on the side of a flat bed without using bedrails?: A Lot Help needed moving to and from a bed to a chair (including a wheelchair)?: A Lot Help needed standing up from a chair using your arms (e.g., wheelchair or bedside chair)?: A Lot Help needed to walk in hospital room?: Total Help needed climbing 3-5 steps with a railing? : Total 6 Click Score: 11    End  of Session Equipment Utilized During Treatment: Gait belt Activity Tolerance: Patient limited by pain Patient left: in bed;with call bell/phone within reach;with bed alarm set Nurse Communication: Mobility status;Weight bearing status PT Visit  Diagnosis: Unsteadiness on feet (R26.81);Other abnormalities of gait and mobility (R26.89);Muscle weakness (generalized) (M62.81);Pain Pain - part of body: Shoulder;Ankle and joints of foot (R shoulder, L foot)    Time: [id number] PT Time Calculation (min) (ACUTE ONLY): 26 min   Charges:   PT Evaluation $PT Eval Moderate Complexity: 1 Mod           Tori Annell Canty PT, DPT 04/22/22, 1:13 PM

## 2022-04-22 NOTE — Progress Notes (Signed)
First attempt to contact MD regarding patient and family concerns regarding seeing the patient and family concerns for placement after discharge from home.

## 2022-04-22 NOTE — Progress Notes (Signed)
Orthopedic Tech Progress Note Patient Details:  Elizabeth Mcconnell [birth year] [id number]  Ortho Devices Type of Ortho Device: Postop shoe/boot Ortho Device/Splint Location: LLE Ortho Device/Splint Interventions: Ordered   Post Interventions Instructions Provided: Adjustment of device, Care of device  [place] A Kevonna Nolte 04/22/2022, 12:04 PM

## 2022-04-22 NOTE — Progress Notes (Signed)
PROGRESS NOTE    Nadalie Leonetti  [id number] DOB: 07-21-1945 DOA: 04/20/2022 PCP: [name], MD   Brief Narrative: Surabhi Pignotti is a 77 y.o. female with a history of atrial fibrillation on Eliquis and Tikosyn, anxiety, SBO, diverticulosis. Patient presented secondary to a fall, suffering right humerus and left fifth proximal metatarsal fractures. Orthopedic surgery consulted. Sling applied to right arm; non-weight bearing.   Assessment and Plan:  Right humerus head fracture Secondary to fall. Orthopedic surgery consulted and recommend non-operative management, a sling and non-weight bearing status of RUE. -PT/OT -Continue oxycodone 2.5-5 mg PRN  Left fifth proximal metatarsal fracture Secondary to fall. Short leg splint applied in the ED. Orthopedic surgery consulted and are now recommending non-operative management, TDWB while in post-op boot with a rolling walker only. -PT/OT  Hypoxia Asymptomatic. Likely secondary to opioid analgesics and respiratory depression. Supplemental oxygen applied. Chest x-ray imaging without definitive infiltrate to suggest infection. -Wean to room air -Incentive spirometer -Minimize narcotics  Generalized anxiety -Continue Buspar -Continue Xanax PRN  Primary hypertension -Continue amlodipine, losartan and nebivolol  Hyperlipidemia -Continue pravastatin  Paroxysmal atrial fibrillation Eliquis held secondary to need for surgery this admission. -Continue Tikosyn and nebivolol -Restart Eliquis  Confusion Likely related to narcotic analgesics. Regimen adjusted. Hopeful improvement of mental status.   DVT prophylaxis: Eliquis Code Status:   Code Status: Full Code Family Communication: None at bedside Disposition Plan: Discharge likely to SNF pending continued orthopedic surgery management/recommendations and PT/OT recommendations   Consultants:  Orthopedic surgery  Procedures:  None  Antimicrobials: None     Subjective: Patient states no orthopedic surgeon saw her yesterday and that there is no plan for her foot. She states no doctor saw her yesterday. She also accused me of authorizing weight bearing status of LLE yesterday, with the knowledge of her broken foot  Objective: BP (!) 155/76 (BP Location: Right Leg)   Pulse 78   Temp 98.2 F (36.8 C) (Oral)   Resp 19   Ht 5' 5"$  (1.651 m)   Wt 72.3 kg   SpO2 90%   BMI 26.53 kg/m   Examination:  General exam: Appears calm and comfortable  Respiratory system: Clear to auscultation. Respiratory effort normal. Cardiovascular system: S1 & S2 heard, RRR. Gastrointestinal system: Abdomen is nondistended, soft and nontender. Normal bowel sounds heard. Central nervous system: Alert and oriented. No focal neurological deficits. Musculoskeletal: No edema. No calf tenderness. Left foot in splint and right UE in sling Skin: No cyanosis. No rashes    Data Reviewed: I have personally reviewed following labs and imaging studies  CBC Lab Results  Component Value Date   WBC 14.8 (H) 04/20/2022   RBC 4.42 04/20/2022   HGB 13.0 04/20/2022   HCT 39.7 04/20/2022   MCV 89.8 04/20/2022   MCH 29.4 04/20/2022   PLT 175 04/20/2022   MCHC 32.7 04/20/2022   RDW 13.5 04/20/2022   LYMPHSABS 0.3 (L) 06/30/2021   MONOABS 0.3 06/30/2021   EOSABS 0.0 06/30/2021   BASOSABS 0.0 0000000     Last metabolic panel Lab Results  Component Value Date   NA 136 04/20/2022   K 4.3 04/20/2022   CL 104 04/20/2022   CO2 22 04/20/2022   BUN 11 04/20/2022   CREATININE 0.72 04/20/2022   GLUCOSE 120 (H) 04/20/2022   GFRNONAA >60 04/20/2022   GFRAA 76 06/24/2019   CALCIUM 9.8 04/20/2022   PHOS 2.8 05/08/2015   PROT 7.3 07/26/2019   ALBUMIN 4.6 07/26/2019   BILITOT 0.9  07/26/2019   ALKPHOS 87 07/26/2019   AST 25 07/26/2019   ALT 27 07/26/2019   ANIONGAP 10 04/20/2022    GFR: Estimated Creatinine Clearance: 57.7 mL/min (by C-G formula based on SCr of  0.72 mg/dL).  No results found for this or any previous visit (from the past 240 hour(s)).    Radiology Studies: DG CHEST PORT 1 VIEW  Result Date: 04/22/2022 CLINICAL DATA:  Hypoxia EXAM: PORTABLE CHEST 1 VIEW COMPARISON:  Portable exam 0024 hours compared to 04/20/2022 FINDINGS: Normal heart size and pulmonary vascularity. Superior elevation of LEFT hilum with significant LEFT upper lobe volume loss and pleuroparenchymal scarring. Remaining lungs appear emphysematous but clear. No acute infiltrate, pleural effusion, or pneumothorax. Comminuted RIGHT humeral head/neck fracture unchanged since 04/20/2022. Osseous demineralization with levoconvex thoracic scoliosis. IMPRESSION: Stable LEFT upper lobe scarring and volume loss. Underlying emphysematous changes. Subacute comminuted proximal RIGHT humeral fracture. Electronically Signed   By: Lavonia Dana M.D.   On: 04/22/2022 09:14   CT FOOT LEFT WO CONTRAST  Result Date: 04/22/2022 CLINICAL DATA:  Fracture, foot EXAM: CT OF THE LEFT FOOT WITHOUT CONTRAST TECHNIQUE: Multidetector CT imaging of the left foot was performed according to the standard protocol. Multiplanar CT image reconstructions were also generated. RADIATION DOSE REDUCTION: This exam was performed according to the departmental dose-optimization program which includes automated exposure control, adjustment of the mA and/or kV according to patient size and/or use of iterative reconstruction technique. COMPARISON:  Radiograph 04/20/2022. FINDINGS: Bones/Joint/Cartilage There is a nondisplaced fracture at the distal aspect of the fifth toe proximal phalanx. There is a nondisplaced fracture at the base of the fifth metatarsal extending into the TMT joint. Ligaments Suboptimally assessed by CT. Muscles and Tendons No acute myotendinous abnormality by CT. Soft tissues Soft tissue swelling of the foot and ankle. IMPRESSION: Nondisplaced intra-articular fracture of the base of the fifth metatarsal.  Nondisplaced fracture of the distal aspect of the fifth toe proximal phalanx. Electronically Signed   By: Maurine Simmering M.D.   On: 04/22/2022 08:47   DG Chest Portable 1 View  Result Date: 04/21/2022 CLINICAL DATA:  low oxygen sats EXAM: PORTABLE CHEST 1 VIEW.  Patient is rotated. COMPARISON:  Chest x-ray 12/31/2021 FINDINGS: The heart and mediastinal contours are grossly unchanged evaluation due to patient rotation. Aortic calcification. Possible hazy airspace opacity at the left base and possible pleural effusion. No pulmonary edema. No right pleural effusion. No pneumothorax. No acute osseous abnormality. IMPRESSION: 1. Possible hazy airspace opacity at the left base and possible left pleural effusion with limited evaluation due to patient rotation. Recommend repeat PA and lateral view of the chest. 2.  Aortic Atherosclerosis (ICD10-I70.0). Electronically Signed   By: Iven Finn M.D.   On: 04/21/2022 00:14   DG Toe 5th Left  Result Date: 04/20/2022 CLINICAL DATA:  Fifth toe pain with known fifth metatarsal fracture. EXAM: DG TOE 5TH LEFT COMPARISON:  Films from earlier in the same day. FINDINGS: There is a lucency along the lateral aspect of the distal fifth proximal phalanx suspicious for undisplaced fracture. No other fracture seen. The known proximal fifth metatarsal fracture is not appreciated on this exam. IMPRESSION: Lucency in the fifth proximal phalanx distally suspicious for undisplaced fracture. Electronically Signed   By: Inez Catalina M.D.   On: 04/20/2022 18:46   DG Foot 2 Views Left  Result Date: 04/20/2022 CLINICAL DATA:  a tripped and fell. Lateral foot pain. Patient on Eliquis. EXAM: LEFT FOOT - 2 VIEW COMPARISON:  None Available.  FINDINGS: Fracture, nondisplaced, across the base of the fifth metatarsal, predominantly across the metaphysis, a Jones fracture. No other fracture.  No bone lesion. Asymmetric joint space narrowing of the first metatarsal phalangeal joint. Remaining  joints are normally spaced and aligned. Small dorsal and plantar calcaneal spurs. Mild lateral soft tissue swelling. IMPRESSION: 1. Nondisplaced fracture across the proximal fifth metatarsal, across the metaphysis. 2. No other fractures.  No dislocation. Electronically Signed   By: Lajean Manes M.D.   On: 04/20/2022 17:38   DG Shoulder Right  Result Date: 04/20/2022 CLINICAL DATA:  Tripped and fell.  Shoulder pain EXAM: RIGHT SHOULDER - 2+ VIEW COMPARISON:  None Available. FINDINGS: Comminuted fracture of the RIGHT humeral head. Impaction type fracture. Avulsion of the greater tuberosity. Subluxation of the humeral head in the glenohumeral joint inferiorly suggest hemarthrosis. IMPRESSION: 1. Mildly comminuted impaction fracture of the RIGHT humeral head with avulsion of the greater tuberosity. 2. Hemarthrosis with inferior subluxation. Electronically Signed   By: Suzy Bouchard M.D.   On: 04/20/2022 17:31      LOS: 1 day    Cordelia Poche, MD [facility] 04/22/2022, 9:46 AM   If 7PM-7AM, please contact night-coverage [email or web address]

## 2022-04-22 NOTE — Plan of Care (Signed)

## 2022-04-23 DIAGNOSIS — S4291XA Fracture of right shoulder girdle, part unspecified, initial encounter for closed fracture: Secondary | ICD-10-CM

## 2022-04-23 DIAGNOSIS — S42291A Other displaced fracture of upper end of right humerus, initial encounter for closed fracture: Secondary | ICD-10-CM | POA: Diagnosis not present

## 2022-04-23 DIAGNOSIS — S92355A Nondisplaced fracture of fifth metatarsal bone, left foot, initial encounter for closed fracture: Secondary | ICD-10-CM | POA: Diagnosis not present

## 2022-04-23 MED ORDER — MELATONIN 3 MG PO TABS
3.0000 mg | ORAL_TABLET | Freq: Every evening | ORAL | Status: DC | PRN
  Administered 2022-04-23: 3 mg via ORAL
  Filled 2022-04-23: qty 1

## 2022-04-23 MED ORDER — BUDESONIDE-FORMOTEROL FUMARATE 160-4.5 MCG/ACT IN AERO
2.0000 | INHALATION_SPRAY | Freq: Two times a day (BID) | RESPIRATORY_TRACT | Status: DC
  Administered 2022-04-23 – 2022-04-26 (×6): 2 via RESPIRATORY_TRACT
  Filled 2022-04-23: qty 6

## 2022-04-23 MED ORDER — FLUTICASONE PROPIONATE 50 MCG/ACT NA SUSP
2.0000 | Freq: Two times a day (BID) | NASAL | Status: DC
  Administered 2022-04-23 – 2022-04-26 (×7): 2 via NASAL
  Filled 2022-04-23: qty 16

## 2022-04-23 NOTE — NC FL2 (Signed)
[place] MEDICAID FL2 LEVEL OF CARE FORM     IDENTIFICATION  Patient Name: Elizabeth Mcconnell Birthdate: Feb 03, 1946 Sex: female Admission Date (Current Location): 04/20/2022  [facility] and [place] Number:  Herbalist and Address:  [facility],  [address], [place]      Provider Number: [id number]  Attending Physician Name and Address:  Mariel Aloe, MD  Relative Name and Phone Number:  Lenna Gilford [phone]    Current Level of Care: Hospital Recommended Level of Care: [place] Prior Approval Number:    Date Approved/Denied:   PASRR Number: Pending  Discharge Plan: SNF    Current Diagnoses: Patient Active Problem List   Diagnosis Date Noted   Closed nondisplaced fracture of fifth left metatarsal bone 04/22/2022   Humerus head fracture 04/21/2022   Shoulder fracture 04/21/2022   Carotid atherosclerosis, bilateral 04/03/2021   Dizziness 07/26/2019   Shaking 07/26/2019   Subclinical hyperthyroidism 03/18/2019   Need for shingles vaccine 10/16/2018   Need for pneumococcal vaccination 10/16/2018   Visit for screening mammogram 10/16/2018   Low TSH level 09/15/2017   Cigarette smoker 05/09/2016   B12 nutritional deficiency 05/30/2015   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 05/04/2015 05/02/2015   Routine general medical examination at a health care facility    Paroxysmal atrial fibrillation ([place]) 02/14/2015   Hyperlipidemia with target LDL less than 160 06/13/2014   Essential hypertension 05/30/2014   OA (osteoarthritis)    GAD (generalized anxiety disorder) 08/08/2008   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  01/15/2007    Orientation RESPIRATION BLADDER Height & Weight     Self, Time, Situation, Place  Normal Continent Weight: 159 lb 6.4 oz (72.3 kg) Height:  5' 5"$  (165.1 cm)  BEHAVIORAL SYMPTOMS/MOOD NEUROLOGICAL BOWEL NUTRITION STATUS      Continent Diet (regular)  AMBULATORY  STATUS COMMUNICATION OF NEEDS Skin   Limited Assist Verbally Normal                       Personal Care Assistance Level of Assistance  Bathing, Feeding, Dressing Bathing Assistance: Maximum assistance Feeding assistance: Independent Dressing Assistance: Maximum assistance     Functional Limitations Info  Sight, Hearing, Speech Sight Info: Impaired Hearing Info: Adequate Speech Info: Adequate    SPECIAL CARE FACTORS FREQUENCY  PT (By licensed PT), OT (By licensed OT)     PT Frequency: 5x/wk OT Frequency: 5x/wk            Contractures Contractures Info: Not present    Additional Factors Info  Code Status, Allergies, Psychotropic Code Status Info: FULL Allergies Info: Tramadol, Amoxicillin-pot Clavulanate, Amoxicillin, Flagyl (Metronidazole), Flecainide, Gabapentin, Omnicef (Cefdinir), Avelox (Moxifloxacin), Clavulanic Acid Psychotropic Info: See MAR         Current Medications (04/23/2022):  This is the current hospital active medication list Current Facility-Administered Medications  Medication Dose Route Frequency Provider Last Rate Last Admin   acetaminophen (TYLENOL) tablet 650 mg  650 mg Oral Q6H PRN Mariel Aloe, MD   650 mg at 04/23/22 1352   ALPRAZolam Duanne Moron) tablet 0.5 mg  0.5 mg Oral QHS PRN Emilee Hero, MD   0.5 mg at 04/22/22 2103   amLODipine (NORVASC) tablet 2.5 mg  2.5 mg Oral Daily Emilee Hero, MD   2.5 mg at 04/23/22 0931   apixaban (ELIQUIS) tablet 5 mg  5 mg Oral BID Mariel Aloe, MD   5 mg at  04/23/22 0931   budesonide-formoterol (SYMBICORT) 160-4.5 MCG/ACT inhaler 2 puff  2 puff Inhalation BID Mariel Aloe, MD       busPIRone (BUSPAR) tablet 10 mg  10 mg Oral BID Emilee Hero, MD   10 mg at 04/23/22 [id number]   dofetilide (TIKOSYN) capsule 250 mcg  250 mcg Oral BID Emilee Hero, MD   250 mcg at 04/23/22 0931   fluticasone (FLONASE) 50 MCG/ACT nasal spray 2 spray  2 spray Each Nare BID Mariel Aloe, MD   2 spray at 04/23/22  1243   levalbuterol (XOPENEX) nebulizer solution 0.63 mg  0.63 mg Nebulization Q6H PRN Mariel Aloe, MD       losartan (COZAAR) tablet 100 mg  100 mg Oral Daily Dorrell, Herbie [place], MD   100 mg at 04/23/22 0931   melatonin tablet 3 mg  3 mg Oral QHS PRN Kathryne Eriksson, NP   3 mg at 04/23/22 0138   montelukast (SINGULAIR) tablet 10 mg  10 mg Oral Daily Emilee Hero, MD   10 mg at 04/23/22 0931   nebivolol (BYSTOLIC) tablet 5 mg  5 mg Oral Daily Dorrell, Herbie [place], MD   5 mg at 04/23/22 [id number]   oxyCODONE (Oxy IR/ROXICODONE) immediate release tablet 2.5-5 mg  2.5-5 mg Oral Q4H PRN Mariel Aloe, MD   5 mg at 04/22/22 [id number]   potassium chloride (KLOR-CON M) CR tablet 20 mEq  20 mEq Oral Daily Emilee Hero, MD   20 mEq at 04/23/22 0931   pravastatin (PRAVACHOL) tablet 40 mg  40 mg Oral q1800 Emilee Hero, MD   40 mg at 04/22/22 1725     Discharge Medications: Please see discharge summary for a list of discharge medications.  Relevant Imaging Results:  Relevant Lab Results:   Additional Information SSN: [id number]  Vassie Moselle, LCSW

## 2022-04-23 NOTE — Evaluation (Signed)
Occupational Therapy Evaluation Patient Details Name: Elizabeth Mcconnell MRN: [id number] DOB: 19-Jan-1946 Today's Date: 04/23/2022   History of Present Illness Elizabeth Mcconnell is a 77 y.o. female presents after trip and fall on uneven surface. Pt with R humerus head fracture and L 5th proximal metatarsal fracture. PMH: A-fib, diverticulosis, small bowel obstruction, HTN, osteopenia   Clinical Impression   Patient is a 77 year old female who was admitted for above. Patient was living at home alone independently prior level. Patient was unable to maintain WB status with nursing made aware when standing. Patient was educated on proper positioning of sling for RUE. Patient verbalized understanding. Patient was noted to have decreased functional activity tolerance, decreased endurance, decreased standing balance, decreased safety awareness, and decreased knowledge of AD/AE impacting participation in ADLs. Patient would continue to benefit from skilled OT services at this time while admitted and after d/c to address noted deficits in order to improve overall safety and independence in ADLs.       Recommendations for follow up therapy are one component of a multi-disciplinary discharge planning process, led by the attending physician.  Recommendations may be updated based on patient status, additional functional criteria and insurance authorization.   Follow Up Recommendations  Skilled nursing-short term rehab (<3 hours/day)     Assistance Recommended at Discharge Frequent or constant Supervision/Assistance  Patient can return home with the following Assistance with cooking/housework;Direct supervision/assist for medications management;Assist for transportation;Help with stairs or ramp for entrance;Direct supervision/assist for financial management;A lot of help with walking and/or transfers    Functional Status Assessment  Patient has had a recent decline in their functional status and  demonstrates the ability to make significant improvements in function in a reasonable and predictable amount of time.  Equipment Recommendations  None recommended by OT       Precautions / Restrictions Precautions Precautions: Fall Required Braces or Orthoses: Sling;Other Brace Other Brace: RUE in sling, L foot in post-op shoe Restrictions Weight Bearing Restrictions: Yes RUE Weight Bearing: Non weight bearing LLE Weight Bearing: Touchdown weight bearing (though heel per ortho note on 2/19)      Mobility Bed Mobility Overal bed mobility: Needs Assistance Bed Mobility: Supine to Sit, Sit to Supine     Supine to sit: Max assist     General bed mobility comments: patient was unable to be pursauded to get out of bed on L side of bed to be able to push up with LUE. patient impulsive with movements and needed increased cues to slow down         Balance Overall balance assessment: Needs assistance Sitting-balance support: Feet supported, Single extremity supported Sitting balance-Leahy Scale: Fair     Standing balance support: Reliant on assistive device for balance, During functional activity, Single extremity supported Standing balance-Leahy Scale: Zero Standing balance comment: unable to maintain WB restrictions           ADL either performed or assessed with clinical judgement   ADL Overall ADL's : Needs assistance/impaired Eating/Feeding: Modified independent;Sitting   Grooming: Set up;Sitting;Wash/dry face;Brushing hair Grooming Details (indicate cue type and reason): in recliner Upper Body Bathing: Maximal assistance;Sitting Upper Body Bathing Details (indicate cue type and reason): in relciner Lower Body Bathing: Maximal assistance;Sitting/lateral leans   Upper Body Dressing : Maximal assistance;Sitting Upper Body Dressing Details (indicate cue type and reason): with education on sequencing of don/doff shirt without moving RUE Lower Body Dressing: Maximal  assistance;Sitting/lateral leans   Toilet Transfer: +2 for physical assistance;+2 for safety/equipment  Toilet Transfer Details (indicate cue type and reason): to scoot transfer to recliner with LLE kept at NWB in air. patient had increasingly difficult time with attempting to maintain WB restrictions with patient noted to lean through LLE after patient was multimodal eudcation on maintaining precautions with physical A. Toileting- Clothing Manipulation and Hygiene: Maximal assistance;Bed level                Pertinent Vitals/Pain Pain Assessment Pain Assessment: Faces Faces Pain Scale: Hurts whole lot Pain Location: R UE Pain Descriptors / Indicators: Discomfort, Grimacing Pain Intervention(s): Limited activity within patient's tolerance, Monitored during session, Repositioned     Hand Dominance Right   Extremity/Trunk Assessment Upper Extremity Assessment Upper Extremity Assessment: RUE deficits/detail RUE Deficits / Details: conservative measures with noted large brusing on distal upper arm and elbow area. no ROM per conservative measures. sling in place   Lower Extremity Assessment Lower Extremity Assessment: Defer to PT evaluation   Cervical / Trunk Assessment Cervical / Trunk Assessment: Normal   Communication Communication Communication: No difficulties   Cognition Arousal/Alertness: Awake/alert Behavior During Therapy: WFL for tasks assessed/performed, Anxious Overall Cognitive Status: Within Functional Limits for tasks assessed       General Comments: patient was impulsive with movements, patient was unable to remember WB restrictions or maintain them during session.                Home Living Family/patient expects to be discharged to:: Private residence Living Arrangements: Alone Available Help at Discharge: Family;Available PRN/intermittently;Friend(s) Type of Home: House Home Access: Stairs to enter [employer] of Steps: 3 Entrance  Stairs-Rails: Right;Left Home Layout: Two level;Able to live on main level with bedroom/bathroom     Bathroom Shower/Tub: Walk-in shower         Home Equipment: None          Prior Functioning/Environment Prior Level of Function : Independent/Modified Independent;Driving             Mobility Comments: pt reports ind ADLs Comments: pt reports ind        OT Problem List: Decreased strength;Decreased activity tolerance;Decreased coordination;Decreased safety awareness;Decreased knowledge of precautions;Impaired balance (sitting and/or standing);Pain;Impaired UE functional use      OT Treatment/Interventions: Self-care/ADL training;Energy conservation;Therapeutic exercise;DME and/or AE instruction;Therapeutic activities;Patient/family education;Balance training    OT Goals(Current goals can be found in the care plan section) Acute Rehab OT Goals Patient Stated Goal: to get better OT Goal Formulation: With patient Time For Goal Achievement: 05/07/22 Potential to Achieve Goals: Fair  OT Frequency: Min 2X/week       AM-PAC OT "6 Clicks" Daily Activity     Outcome Measure Help from another person eating meals?: A Little Help from another person taking care of personal grooming?: A Little Help from another person toileting, which includes using toliet, bedpan, or urinal?: A Lot Help from another person bathing (including washing, rinsing, drying)?: A Lot Help from another person to put on and taking off regular upper body clothing?: A Lot Help from another person to put on and taking off regular lower body clothing?: A Lot 6 Click Score: 14   End of Session Equipment Utilized During Treatment: Gait belt Nurse Communication: Other (comment)  Activity Tolerance: Patient tolerated treatment well Patient left: in chair;with call bell/phone within reach;with chair alarm set  OT Visit Diagnosis: Unsteadiness on feet (R26.81);Other abnormalities of gait and mobility  (R26.89);Muscle weakness (generalized) (M62.81)  Time: [id number] OT Time Calculation (min): 25 min Charges:  OT General Charges $OT Visit: 1 Visit OT Evaluation $OT Eval Moderate Complexity: 1 Mod OT Treatments $Self Care/Home Management : 8-22 mins  Rennie Plowman, MS Acute Rehabilitation Department Office# [phone]   Willa Rough 04/23/2022, 4:16 PM

## 2022-04-23 NOTE — Progress Notes (Signed)
Inpatient Rehab Admissions Coordinator:    I spoke with pt. Regarding potential CIR admit. She was interested but does not have 24/7 support at home. Additionally, her payor is very unlikley to pay for AIR admit for orthopedic injuries. She would like to be worked up for SNF.  Clemens Catholic, [place], [place] Admissions Coordinator  [phone] ([place]) [phone] (office)

## 2022-04-23 NOTE — TOC Initial Note (Addendum)
Transition of Care [facility]) - Initial/Assessment Note    Patient Details  Name: Elizabeth Mcconnell MRN: [id number] Date of Birth: 1945-12-04  Transition [facility]) CM/SW Contact:    Vassie Moselle, LCSW Phone Number: 04/23/2022, 1:58 PM  Clinical Narrative:                 Met with pt and son at bedside to discuss discharge plans. Pt is agreeable to SNF placement however only wants placement at [facility] or [facility] in [place] and does not want to go to facility outside of [place]. CSW shared with pt that [facility] only accept pt's who are already residence of their communities and require private pay as they do not accept insurance. CSW shared that staying within [place] limits pt's options for higher rated facilities. Pt asked about option of returning home. CSW shared that home health could be arranged if she declines SNF offers however, [facility] will only provide services 2-3 days a week vs receiving services 5-7 days a week at [facility].   Pt PASRR at level 2 review. FL-2 and H&P faxed to [facility] for review.  Referrals for SNF have been Left voicemail with [facility] for bed availability.   Update 3:15pm- PASRR Review complete and number assigned: [id number]  Expected Discharge Plan: Black Barriers to Discharge: SNF Pending bed offer   Patient Goals and CMS Choice Patient states their goals for this hospitalization and ongoing recovery are:: To be able to go home CMS Medicare.gov Compare Post Acute Care list provided to:: Patient Choice offered to / list presented to : Patient, Adult [place] ownership interest in [facility].provided to:: Patient    Expected Discharge Plan and Services In-house Referral: NA Discharge Planning Services: NA Post Acute Care Choice: [place] arrangements for the past 2 months: Single Family Home                 DME Arranged:  N/A DME Agency: NA                  Prior Living Arrangements/Services Living arrangements for the past 2 months: Single Family Home Lives with:: Self Patient language and need for interpreter reviewed:: Yes Do you feel safe going back to the place where you live?: Yes      Need for Family Participation in Patient Care: No (Comment) Care giver support system in place?: No (comment) Current home services: Other (comment) (None) Criminal Activity/Legal Involvement Pertinent to Current Situation/Hospitalization: No - Comment as needed  Activities of Daily Living Home Assistive Devices/Equipment: None ADL Screening (condition at time of admission) Patient's cognitive ability adequate to safely complete daily activities?: Yes Is the patient deaf or have difficulty hearing?: No Does the patient have difficulty seeing, even when wearing glasses/contacts?: No Does the patient have difficulty concentrating, remembering, or making decisions?: No Patient able to express need for assistance with ADLs?: No Does the patient have difficulty dressing or bathing?: No Independently performs ADLs?: Yes (appropriate for developmental age) Does the patient have difficulty walking or climbing stairs?: No Weakness of Legs: Both Weakness of Arms/Hands: Both  Permission Sought/Granted Permission sought to share information with : Facility Sport and exercise psychologist, Family Supports Permission granted to share information with : Yes, Verbal Permission Granted  Share Information with NAME: Shela Leff  Permission granted to share info w AGENCY: SNF's  Permission granted to share info w Relationship: Son  Permission granted to  share info w Contact Information: [phone]  Emotional Assessment Appearance:: Appears stated age Attitude/Demeanor/Rapport: Engaged Affect (typically observed): Accepting Orientation: : Oriented to Self, Oriented to Place, Oriented to  Time, Oriented to Situation Alcohol  / Substance Use: Not Applicable Psych Involvement: No (comment)  Admission diagnosis:  Humerus head fracture [S42.293A] Closed fracture of proximal end of right humerus, unspecified fracture morphology, initial encounter [S42.201A] Closed nondisplaced fracture of fifth metatarsal bone of left foot, initial encounter [S92.355A] Shoulder fracture [S42.90XA] Patient Active Problem List   Diagnosis Date Noted   Closed nondisplaced fracture of fifth left metatarsal bone 04/22/2022   Humerus head fracture 04/21/2022   Shoulder fracture 04/21/2022   Carotid atherosclerosis, bilateral 04/03/2021   Dizziness 07/26/2019   Shaking 07/26/2019   Subclinical hyperthyroidism 03/18/2019   Need for shingles vaccine 10/16/2018   Need for pneumococcal vaccination 10/16/2018   Visit for screening mammogram 10/16/2018   Low TSH level 09/15/2017   Cigarette smoker 05/09/2016   B12 nutritional deficiency 05/30/2015   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 [place] [address] Phone: [phone] Fax: [place], [place] [facility] [address] Phone: [phone] Fax: [phone]     Social Determinants of Health (SDOH) Social History: SDOH Screenings   Food Insecurity: No Food Insecurity (04/21/2022)  Housing: Low Risk  (04/21/2022)  Transportation Needs: No Transportation Needs (04/21/2022)   Utilities: Not At Risk (04/21/2022)  Depression (PHQ2-9): Low Risk  (10/14/2018)  Financial Resource Strain: Low Risk  (08/22/2017)  Physical Activity: Unknown (10/14/2018)  Social Connections: Moderately Integrated (08/22/2017)  Stress: No Stress Concern Present (10/14/2018)  Tobacco Use: Medium Risk (04/20/2022)   SDOH Interventions:     Readmission Risk Interventions    04/23/2022    1:53 PM  Readmission Risk Prevention Plan  Post Dischage Appt Complete  Medication Screening Complete  Transportation Screening Complete

## 2022-04-23 NOTE — Plan of Care (Signed)

## 2022-04-23 NOTE — Progress Notes (Addendum)
PROGRESS NOTE    Elizabeth Mcconnell  [id number] DOB: [birth year] DOA: 04/20/2022 PCP: [name], MD   Brief Narrative: Elizabeth Mcconnell is a 77 y.o. female with a history of atrial fibrillation on Eliquis and Tikosyn, anxiety, SBO, diverticulosis. Patient presented secondary to a fall, suffering right humerus and left fifth proximal metatarsal fractures. Orthopedic surgery consulted. Sling applied to right arm; non-weight bearing.   Assessment and Plan:  Right humerus head fracture Secondary to fall. Orthopedic surgery consulted and recommend non-operative management, a sling and non-weight bearing status of RUE. PT/OT recommending acute inpatient rehabilitation. -PT/OT -Continue oxycodone 2.5-5 mg PRN  Left fifth proximal metatarsal fracture Secondary to fall. Short leg splint applied in the ED. Orthopedic surgery consulted and are now recommending non-operative management, TDWB with heel while in post-op boot with a rolling walker only. -PT/OT  Hypoxia Asymptomatic. Likely secondary to opioid analgesics and respiratory depression. Supplemental oxygen applied. Chest x-ray imaging without definitive infiltrate to suggest infection. Patient weaned to room air. Resolved. -Minimize narcotics  Generalized anxiety -Continue Buspar -Continue Xanax PRN  Primary hypertension -Continue amlodipine, losartan and nebivolol  Hyperlipidemia -Continue pravastatin  Paroxysmal atrial fibrillation Eliquis held secondary to need for surgery this admission. -Continue Tikosyn and nebivolol -Continue Eliquis  Confusion Likely related to narcotic analgesics. Regimen adjusted. Hopeful improvement of mental status.   DVT prophylaxis: Eliquis Code Status:   Code Status: Full Code Family Communication: Son at bedside Disposition Plan: Discharge to acute inpatient rehabilitation. Medically stable for discharge.   Consultants:  Orthopedic surgery  Procedures:   None  Antimicrobials: None    Subjective: Patient reports some arm pain, otherwise no concerns today  Objective: BP (!) 150/63 (BP Location: Left Arm)   Pulse 79   Temp 98.1 F (36.7 C) (Oral)   Resp 19   Ht 5' 5"$  (1.651 m)   Wt 72.3 kg   SpO2 92%   BMI 26.53 kg/m   Examination:  General exam: Appears calm and comfortable Respiratory system: Mild wheezing. Respiratory effort normal. Cardiovascular system: S1 & S2 heard, RRR. No murmurs, rubs, gallops or clicks. Gastrointestinal system: Abdomen is nondistended, soft and nontender. Normal bowel sounds heard. Central nervous system: Alert and oriented. No focal neurological deficits. Musculoskeletal: No edema. No calf tenderness Skin: No cyanosis. No rashes Psychiatry: Judgement and insight appear normal. Mood & affect appropriate.    Data Reviewed: I have personally reviewed following labs and imaging studies  CBC Lab Results  Component Value Date   WBC 14.8 (H) 04/20/2022   RBC 4.42 04/20/2022   HGB 13.0 04/20/2022   HCT 39.7 04/20/2022   MCV 89.8 04/20/2022   MCH 29.4 04/20/2022   PLT 175 04/20/2022   MCHC 32.7 04/20/2022   RDW 13.5 04/20/2022   LYMPHSABS 0.3 (L) 06/30/2021   MONOABS 0.3 06/30/2021   EOSABS 0.0 06/30/2021   BASOSABS 0.0 0000000     Last metabolic panel Lab Results  Component Value Date   NA 136 04/20/2022   K 4.3 04/20/2022   CL 104 04/20/2022   CO2 22 04/20/2022   BUN 11 04/20/2022   CREATININE 0.72 04/20/2022   GLUCOSE 120 (H) 04/20/2022   GFRNONAA >60 04/20/2022   GFRAA 76 06/24/2019   CALCIUM 9.8 04/20/2022   PHOS 2.8 05/08/2015   PROT 7.3 07/26/2019   ALBUMIN 4.6 07/26/2019   BILITOT 0.9 07/26/2019   ALKPHOS 87 07/26/2019   AST 25 07/26/2019   ALT 27 07/26/2019   ANIONGAP 10 04/20/2022    GFR:  Estimated Creatinine Clearance: 57.7 mL/min (by C-G formula based on SCr of 0.72 mg/dL).  No results found for this or any previous visit (from the past 240 hour(s)).     Radiology Studies: DG CHEST PORT 1 VIEW  Result Date: 04/22/2022 CLINICAL DATA:  Hypoxia EXAM: PORTABLE CHEST 1 VIEW COMPARISON:  Portable exam 0024 hours compared to 04/20/2022 FINDINGS: Normal heart size and pulmonary vascularity. Superior elevation of LEFT hilum with significant LEFT upper lobe volume loss and pleuroparenchymal scarring. Remaining lungs appear emphysematous but clear. No acute infiltrate, pleural effusion, or pneumothorax. Comminuted RIGHT humeral head/neck fracture unchanged since 04/20/2022. Osseous demineralization with levoconvex thoracic scoliosis. IMPRESSION: Stable LEFT upper lobe scarring and volume loss. Underlying emphysematous changes. Subacute comminuted proximal RIGHT humeral fracture. Electronically Signed   By: Lavonia Dana M.D.   On: 04/22/2022 09:14   CT FOOT LEFT WO CONTRAST  Result Date: 04/22/2022 CLINICAL DATA:  Fracture, foot EXAM: CT OF THE LEFT FOOT WITHOUT CONTRAST TECHNIQUE: Multidetector CT imaging of the left foot was performed according to the standard protocol. Multiplanar CT image reconstructions were also generated. RADIATION DOSE REDUCTION: This exam was performed according to the departmental dose-optimization program which includes automated exposure control, adjustment of the mA and/or kV according to patient size and/or use of iterative reconstruction technique. COMPARISON:  Radiograph 04/20/2022. FINDINGS: Bones/Joint/Cartilage There is a nondisplaced fracture at the distal aspect of the fifth toe proximal phalanx. There is a nondisplaced fracture at the base of the fifth metatarsal extending into the TMT joint. Ligaments Suboptimally assessed by CT. Muscles and Tendons No acute myotendinous abnormality by CT. Soft tissues Soft tissue swelling of the foot and ankle. IMPRESSION: Nondisplaced intra-articular fracture of the base of the fifth metatarsal. Nondisplaced fracture of the distal aspect of the fifth toe proximal phalanx. Electronically  Signed   By: Maurine Simmering M.D.   On: 04/22/2022 08:47      LOS: 2 days    Cordelia Poche, MD [facility] 04/23/2022, 9:56 AM   If 7PM-7AM, please contact night-coverage [email or web address]

## 2022-04-24 DIAGNOSIS — S4291XA Fracture of right shoulder girdle, part unspecified, initial encounter for closed fracture: Secondary | ICD-10-CM | POA: Diagnosis not present

## 2022-04-24 DIAGNOSIS — E871 Hypo-osmolality and hyponatremia: Secondary | ICD-10-CM

## 2022-04-24 DIAGNOSIS — S42291A Other displaced fracture of upper end of right humerus, initial encounter for closed fracture: Secondary | ICD-10-CM | POA: Diagnosis not present

## 2022-04-24 DIAGNOSIS — S92355A Nondisplaced fracture of fifth metatarsal bone, left foot, initial encounter for closed fracture: Secondary | ICD-10-CM | POA: Diagnosis not present

## 2022-04-24 DIAGNOSIS — D649 Anemia, unspecified: Secondary | ICD-10-CM

## 2022-04-24 LAB — CBC WITH DIFFERENTIAL/PLATELET
Abs Immature Granulocytes: 0.04 K/uL (ref 0.00–0.07)
Basophils Absolute: 0 K/uL (ref 0.0–0.1)
Basophils Relative: 0 %
Eosinophils Absolute: 0.1 K/uL (ref 0.0–0.5)
Eosinophils Relative: 1 %
HCT: 37.3 % (ref 36.0–46.0)
Hemoglobin: 11.8 g/dL — ABNORMAL LOW (ref 12.0–15.0)
Immature Granulocytes: 0 %
Lymphocytes Relative: 11 %
Lymphs Abs: 1 K/uL (ref 0.7–4.0)
MCH: 29.6 pg (ref 26.0–34.0)
MCHC: 31.6 g/dL (ref 30.0–36.0)
MCV: 93.5 fL (ref 80.0–100.0)
Monocytes Absolute: 0.5 K/uL (ref 0.1–1.0)
Monocytes Relative: 6 %
Neutro Abs: 7.3 K/uL (ref 1.7–7.7)
Neutrophils Relative %: 82 %
Platelets: 158 K/uL (ref 150–400)
RBC: 3.99 MIL/uL (ref 3.87–5.11)
RDW: 13.2 % (ref 11.5–15.5)
WBC: 9 K/uL (ref 4.0–10.5)
nRBC: 0 % (ref 0.0–0.2)

## 2022-04-24 LAB — COMPREHENSIVE METABOLIC PANEL
ALT: 16 U/L (ref 0–44)
AST: 27 U/L (ref 15–41)
Albumin: 3.8 g/dL (ref 3.5–5.0)
Alkaline Phosphatase: 56 U/L (ref 38–126)
Anion gap: 11 (ref 5–15)
BUN: 10 mg/dL (ref 8–23)
CO2: 20 mmol/L — ABNORMAL LOW (ref 22–32)
Calcium: 8.9 mg/dL (ref 8.9–10.3)
Chloride: 102 mmol/L (ref 98–111)
Creatinine, Ser: 0.63 mg/dL (ref 0.44–1.00)
GFR, Estimated: >60 mL/min (ref >60)
Glucose, Bld: 122 mg/dL — ABNORMAL HIGH (ref 70–99)
Potassium: 3.9 mmol/L (ref 3.5–5.1)
Sodium: 133 mmol/L — ABNORMAL LOW (ref 135–145)
Total Bilirubin: 1.1 mg/dL (ref 0.3–1.2)
Total Protein: 6.9 g/dL (ref 6.5–8.1)

## 2022-04-24 LAB — MAGNESIUM: Magnesium: 2.1 mg/dL (ref 1.7–2.4)

## 2022-04-24 LAB — PHOSPHORUS: Phosphorus: 3.6 mg/dL (ref 2.5–4.6)

## 2022-04-24 MED ORDER — SODIUM CHLORIDE 0.9 % IV SOLN: INTRAVENOUS | Status: AC

## 2022-04-24 MED ORDER — SODIUM CHLORIDE 0.9 % IV SOLN: INTRAVENOUS | Status: DC

## 2022-04-24 NOTE — Progress Notes (Signed)
PROGRESS NOTE    Elizabeth Mcconnell  [id number] DOB: 08-24-1945 DOA: 04/20/2022 PCP: [name], MD   Brief Narrative:  Elizabeth Mcconnell is a 77 y.o. female with a history of atrial fibrillation on Eliquis and Tikosyn, anxiety, SBO, diverticulosis. Patient presented secondary to a fall, suffering right humerus and left fifth proximal metatarsal fractures. Orthopedic surgery consulted and recommending nonoperative management currently. Sling applied to right arm; non-weight bearing.  The OT recommending SNF now and currently awaiting placement as bed offers have been given and she still needs to make a decision along with obtaining insurance authorization.  Assessment and Plan:  Right Humerus head fracture -Secondary to fall.  -Orthopedic surgery consulted and recommend non-operative management, a sling and non-weight bearing status of RUE.  -PT/OT recommending acute inpatient rehabilitation. -PT/OT recommended CIR But now recommending SNF -Continue with Acetaminophen 650 mg po q6hprn mild pain and Oxycodone 2.5-5 mgprn  -Bed offers given to the patient and currently awaiting decision and insurance authorization for SNF placement   Left fifth proximal metatarsal fracture -Secondary to fall. Short leg splint applied in the ED.  -Orthopedic surgery consulted and are now recommending non-operative management, TDWB with heel while in post-op boot with a rolling walker only. -PT/OT recommending SNF    Hypoxia with a history of allergic rhinitis and history of chronic bronchitis -Asymptomatic. - Likely secondary to opioid analgesics and respiratory depression. Supplemental oxygen applied and now removed as patient was weaned to Room Air -Chest x-ray imaging without definitive infiltrate to suggest infection -SpO2: 92 % O2 Flow Rate (L/min): 2 L/min -Minimize narcotics and continue to monitor respiratory status carefully -Continue with Symbicort 2 puffs IH twice daily and Xopenex  0.63 mg nebs every 6 as needed for wheezing or shortness of breath -Continue fluticasone 2 sprays each nare twice daily as well as montelukast 10 mg p.o. daily   Generalized Anxiety -Continue Buspirone 10 mg po BID -Continue Alprazolam 0.5 mg po qHSprn Anxiety    Primary Hypertension -Continue Amlodipine 2.5 mg po Daily, losartan and nebivolol   Hyperlipidemia -Continue Pravastatin 40 mg po daily    Paroxysmal Atrial Fibrillation -Eliquis was held secondary to need for surgery this admission. -Continue Dofetilide 250 mcg po BID and Nebivolol 5 mg pi Daily  -Continue Anticoagulation with Apixaban 5 mg po BID now that she has non-operative management  -If necessary will place her on Telemetry    Confusion -Likely related to narcotic analgesics. -Regimen adjusted to just having Acetaminophen 650 mg po q6hprn Mild Pain and Oxycodone 2.5-5 mg po q4hprn Moderate Pain, Severe Pain and has improvement of mental status. -Delirium Precautions   Normocytic Anemia  -Mild and had a bleeding IV earlier this Admission -Hgb/Hct Trend: Recent Labs  Lab 04/20/22 0810 04/24/22 0944  HGB 13.0 11.8*  HCT 39.7 37.3  MCV 89.8 93.5  -Continue to Monitor for S/Sx of bleeding; No overt bleeding  -Repeat CBC in the AM   Hyponatremia -Mild. Na+ went from 136 -> 133 -Start gentle IVF with NS at 75 mL/hr x 12 hours -Continue to monitor trend and repeat CMP in a.m.  Metabolic Acidosis -Patient has a slight metabolic acidosis with a CO2 of 20, anion gap of 11, chloride level of 102 -Start IV Hydration with NS at 75 mL/hr x12 hours -Repeat CBC in the AM   Leukocytosis -Mild and Likely Reactive in the setting of Pain and Fall  -WBC Trend: Recent Labs  Lab 04/20/22 0810 04/24/22 0944  WBC 14.8* 9.0  -Continue  to Monitor for S/Sx of Infection; no overt infection noted -Repeat CBC in the AM   DVT prophylaxis: SCDs Start: 04/21/22 0539 apixaban (ELIQUIS) tablet 5 mg    Code Status: Full  Code Family Communication: No family present at bedside   Disposition Plan:  Level of care: Med-Surg Status is: Inpatient Remains inpatient appropriate because: Appears medically stable.  Discharge however needs insurance authorization and needs to select a bed for SNF   Consultants:  Orthopedic surgery  Procedures:  As delineated as above  Antimicrobials:  Anti-infectives (From admission, onward)    None       Subjective: Seen and examined at bedside and she is sitting in chair and states that she was complaining of some pain and states that she is not really able to move her right arm.  No nausea or vomiting.  States that she had a "terrible night".  No other concerns or complaints at this time but wants to figure out what the disposition plan is for her given that she is really wanting to go [facility].  Wanting to speak with the Education officer, museum.  No other concerns or complaints at this time but appears medically stable for discharge once bed is secure and insurance authorization is obtained.  Objective: Vitals:   04/23/22 1806 04/23/22 2100 04/24/22 0611 04/24/22 0901  BP:  (!) 149/73 (!) 144/66   Pulse:  77 75   Resp:  16 16   Temp:  98.2 F (36.8 C) 98.2 F (36.8 C)   TempSrc:  Oral Oral   SpO2: 92% 92% 93% 94%  Weight:      Height:        Intake/Output Summary (Last 24 hours) at 04/24/2022 1255 Last data filed at 04/23/2022 1833 Gross per 24 hour  Intake 360 ml  Output --  Net 360 ml   Filed Weights   04/21/22 0341  Weight: 72.3 kg   Examination: Physical Exam:  Constitutional: WN/WD overweight Caucasian female currently no acute distress Respiratory: Diminished to auscultation bilaterally, no wheezing, rales, rhonchi or crackles. Normal respiratory effort and patient is not tachypenic. No accessory muscle use.  Unlabored breathing Cardiovascular: RRR, no murmurs / rubs / gallops. S1 and S2 auscultated. No extremity edema.  Abdomen: Soft, non-tender,  distended secondary to body habitus.  Bowel sounds positive.  GU: Deferred. Musculoskeletal: Right arm is in a sling and has a boot on the left foot.   Skin: Some bruising and ecchymosis noted specifically on her right arm Neurologic: CN 2-12 grossly intact with no focal deficits. Romberg sign and cerebellar reflexes not assessed.  Psychiatric: Normal judgment and insight. Alert and oriented x 3. Normal mood and appropriate affect.   Data Reviewed: I have personally reviewed following labs and imaging studies  CBC: Recent Labs  Lab 04/20/22 0810 04/24/22 0944  WBC 14.8* 9.0  NEUTROABS  --  7.3  HGB 13.0 11.8*  HCT 39.7 37.3  MCV 89.8 93.5  PLT 175 0000000   Basic Metabolic Panel: Recent Labs  Lab 04/20/22 0810 04/24/22 0944  NA 136 133*  K 4.3 3.9  CL 104 102  CO2 22 20*  GLUCOSE 120* 122*  BUN 11 10  CREATININE 0.72 0.63  CALCIUM 9.8 8.9  MG  --  2.1  PHOS  --  3.6   GFR: Estimated Creatinine Clearance: 57.7 mL/min (by C-G formula based on SCr of 0.63 mg/dL). Liver Function Tests: Recent Labs  Lab 04/24/22 0944  AST 27  ALT 16  ALKPHOS 56  BILITOT 1.1  PROT 6.9  ALBUMIN 3.8   No results for input(s): "LIPASE", "AMYLASE" in the last 168 hours. No results for input(s): "AMMONIA" in the last 168 hours. Coagulation Profile: No results for input(s): "INR", "PROTIME" in the last 168 hours. Cardiac Enzymes: No results for input(s): "CKTOTAL", "CKMB", "CKMBINDEX", "TROPONINI" in the last 168 hours. BNP (last 3 results) No results for input(s): "PROBNP" in the last 8760 hours. HbA1C: No results for input(s): "HGBA1C" in the last 72 hours. CBG: No results for input(s): "GLUCAP" in the last 168 hours. Lipid Profile: No results for input(s): "CHOL", "HDL", "LDLCALC", "TRIG", "CHOLHDL", "LDLDIRECT" in the last 72 hours. Thyroid Function Tests: No results for input(s): "TSH", "T4TOTAL", "FREET4", "T3FREE", "THYROIDAB" in the last 72 hours. Anemia Panel: No results  for input(s): "VITAMINB12", "FOLATE", "FERRITIN", "TIBC", "IRON", "RETICCTPCT" in the last 72 hours. Sepsis Labs: No results for input(s): "PROCALCITON", "LATICACIDVEN" in the last 168 hours.  No results found for this or any previous visit (from the past 240 hour(s)).   Radiology Studies: No results found.  Scheduled Meds:  amLODipine  2.5 mg Oral Daily   apixaban  5 mg Oral BID   budesonide-formoterol  2 puff Inhalation BID   busPIRone  10 mg Oral BID   dofetilide  250 mcg Oral BID   fluticasone  2 spray Each Nare BID   losartan  100 mg Oral Daily   montelukast  10 mg Oral Daily   nebivolol  5 mg Oral Daily   potassium chloride  20 mEq Oral Daily   pravastatin  40 mg Oral q1800   Continuous Infusions:   LOS: 3 days   Raiford Noble, DO [facility] Available via Epic secure chat 7am-7pm After these hours, please refer to coverage provider listed on [email or web address] 04/24/2022, 12:55 PM

## 2022-04-24 NOTE — Progress Notes (Signed)
Physical Therapy Treatment Patient Details Name: Elizabeth Mcconnell MRN: [id number] DOB: [birth year] Today's Date: 04/24/2022   History of Present Illness Aidia Moczygemba is a 77 y.o. female presents after trip and fall on uneven surface. Pt with R humerus head fracture and L 5th proximal metatarsal fracture. PMH: A-fib, diverticulosis, small bowel obstruction, HTN, osteopenia    PT Comments    Pt is progressing toward acute PT goals with progression to ambulation this session. Pt ambulated ~79f with MIN A and use of quad cane.  Pt continues to demonstrate difficulty maintaining TDWB through heel on L LE requiring repeated verbal and visual cuing for proper technique. Pt does reports that she has overall been feeling better and has decreased pain levels compared to previous sessions. Pt will benefit from continued skilled PT to increase her independence and maximize safety with mobility.      Recommendations for follow up therapy are one component of a multi-disciplinary discharge planning process, led by the attending physician.  Recommendations may be updated based on patient status, additional functional criteria and insurance authorization.  Follow Up Recommendations  Acute inpatient rehab (3hours/day)     Assistance Recommended at Discharge Frequent or constant Supervision/Assistance  Patient can return home with the following A lot of help with bathing/dressing/bathroom;Assistance with cooking/housework;Assist for transportation;Help with stairs or ramp for entrance;A little help with walking and/or transfers   Equipment Recommendations  Other (comment) (defer to next level of care)    Recommendations for Other Services       Precautions / Restrictions Precautions Precautions: Fall Other Brace: RUE in sling, L foot in post-op shoe Restrictions Weight Bearing Restrictions: Yes RUE Weight Bearing: Non weight bearing LLE Weight Bearing: Touchdown weight bearing (through  heel per ortho note on 2/19)     Mobility  Bed Mobility Overal bed mobility: Needs Assistance Bed Mobility: Supine to Sit, Sit to Supine     Supine to sit: Mod assist, HOB elevated Sit to supine: Min assist, HOB elevated   General bed mobility comments: cues for use of L UE to assist with bringing truk to upright. Pt able to initiate progression of B LEs to EOB    Transfers Overall transfer level: Needs assistance Equipment used: Quad cane Transfers: Sit to/from Stand Sit to Stand: Min assist           General transfer comment: x3 total. From EOB, recliner, and toilet. appears to maintain TDWB on LLE with post-op shoe donned    Ambulation/Gait Ambulation/Gait assistance: Min assist Gait Distance (Feet): 30 Feet Assistive device: Quad cane Gait Pattern/deviations: Step-to pattern, Decreased stride length Gait velocity: decreased     General Gait Details: 317f 1068fPt requires repeated cues for toes to ceiling and heel WB with TDWB on L LE due to difficulty maintaining  and for placement and sequencing with quad cane to allow improved stability. Multiple small LOB episodes requiring MIN A from therapist to correct. Pt reports feeling better and having less pain compared to previous sessions.   Stairs             Wheelchair Mobility    Modified Rankin (Stroke Patients Only)       Balance Overall balance assessment: Needs assistance Sitting-balance support: Feet supported Sitting balance-Leahy Scale: Fair     Standing balance support: Single extremity supported, During functional activity Standing balance-Leahy Scale: Fair  Cognition Arousal/Alertness: Awake/alert Behavior During Therapy: WFL for tasks assessed/performed, Anxious Overall Cognitive Status: Within Functional Limits for tasks assessed                                          Exercises      General Comments         Pertinent Vitals/Pain Pain Assessment Pain Assessment: Faces Faces Pain Scale: Hurts little more Pain Location: headahce Pain Descriptors / Indicators: Aching Pain Intervention(s): Monitored during session, Patient requesting pain meds-RN notified    Home Living                          Prior Function            PT Goals (current goals can now be found in the care plan section) Acute Rehab PT Goals Patient Stated Goal: "feel better" PT Goal Formulation: With patient Time For Goal Achievement: 05/06/22 Potential to Achieve Goals: Good Progress towards PT goals: Progressing toward goals    Frequency    Min 3X/week      PT Plan Current plan remains appropriate    Co-evaluation              AM-PAC PT "6 Clicks" Mobility   Outcome Measure  Help needed turning from your back to your side while in a flat bed without using bedrails?: A Little Help needed moving from lying on your back to sitting on the side of a flat bed without using bedrails?: A Little Help needed moving to and from a bed to a chair (including a wheelchair)?: A Little Help needed standing up from a chair using your arms (e.g., wheelchair or bedside chair)?: A Little Help needed to walk in hospital room?: A Little Help needed climbing 3-5 steps with a railing? : A Lot 6 Click Score: 17    End of Session   Activity Tolerance: Patient tolerated treatment well Patient left: in bed;with call bell/phone within reach;with bed alarm set Nurse Communication: Mobility status;Weight bearing status PT Visit Diagnosis: Unsteadiness on feet (R26.81);Other abnormalities of gait and mobility (R26.89);Muscle weakness (generalized) (M62.81)     Time: [id number] PT Time Calculation (min) (ACUTE ONLY): 27 min  Charges:  $Gait Training: 8-22 mins $Therapeutic Activity: 8-22 mins                     Festus Barren PT, DPT  Acute Rehabilitation Services  Office [phone]  04/24/2022, 2:35 PM

## 2022-04-24 NOTE — TOC Progression Note (Signed)
Transition of Care [facility]) - Progression Note    Patient Details  Name: Elizabeth Mcconnell MRN: [id number] Date of Birth: Jul 25, 1945  Transition of Care [facility]) CM/SW [place], LCSW Phone Number: 04/24/2022, 8:43 AM  Clinical Narrative:    Received call from [facility] who state that they are unable to accept pt's for SNF outside of their community at this time.   Expected Discharge Plan: Skilled Nursing Facility Barriers to Discharge: SNF Pending bed offer  Expected Discharge Plan and Services In-house Referral: NA Discharge Planning Services: NA Post Acute Care Choice: [place] arrangements for the past 2 months: Single Family Home                 DME Arranged: N/A DME Agency: NA                   Social Determinants of Health (SDOH) Interventions SDOH Screenings   Food Insecurity: No Food Insecurity (04/21/2022)  Housing: Low Risk  (04/21/2022)  Transportation Needs: No Transportation Needs (04/21/2022)  Utilities: Not At Risk (04/21/2022)  Depression (PHQ2-9): Low Risk  (10/14/2018)  Financial Resource Strain: Low Risk  (08/22/2017)  Physical Activity: Unknown (10/14/2018)  Social Connections: Moderately Integrated (08/22/2017)  Stress: No Stress Concern Present (10/14/2018)  Tobacco Use: Medium Risk (04/20/2022)    Readmission Risk Interventions    04/23/2022    1:53 PM  Readmission Risk Prevention Plan  Post Dischage Appt Complete  Medication Screening Complete  Transportation Screening Complete

## 2022-04-24 NOTE — TOC Progression Note (Addendum)
Transition of Care [facility]) - Progression Note    Patient Details  Name: Elizabeth Mcconnell MRN: [id number] Date of Birth: [birth year]  Transition of Care [facility]) CM/SW Contact  Henrietta Dine, RN Phone Number: 04/24/2022, 10:48 AM  Clinical Narrative:    Notified pt would like TOC to contact [facility] regarding rehab because her friend "went there"; contacted Katy Fitch, SW at facility [phone]); she says they only have beds for their independent living residents; spoke w/ pt in room and explained [facility] only offers beds to their independent living residents; pt verbalized understanding; clarified w/ pt she wants to be sent out for SNF; she confirms and would like search be limited to [facility]; explained SNF process; she again verbalized understanding; previously sent out for offers on 04/23/22; pt given list; awaiting choice; ins auth.  Expected Discharge Plan: Skilled Nursing Facility Barriers to Discharge: SNF Pending bed offer  Expected Discharge Plan and Services In-house Referral: NA Discharge Planning Services: NA Post Acute Care Choice: [employer] arrangements for the past 2 months: Single Family Home                 DME Arranged: N/A DME Agency: NA                   Social Determinants of Health (SDOH) Interventions SDOH Screenings   Food Insecurity: No Food Insecurity (04/21/2022)  Housing: Low Risk  (04/21/2022)  Transportation Needs: No Transportation Needs (04/21/2022)  Utilities: Not At Risk (04/21/2022)  Depression (PHQ2-9): Low Risk  (10/14/2018)  Financial Resource Strain: Low Risk  (08/22/2017)  Physical Activity: Unknown (10/14/2018)  Social Connections: Moderately Integrated (08/22/2017)  Stress: No Stress Concern Present (10/14/2018)  Tobacco Use: Medium Risk (04/20/2022)    Readmission Risk Interventions    04/23/2022    1:53 PM  Readmission Risk Prevention Plan  Post Dischage Appt  Complete  Medication Screening Complete  Transportation Screening Complete

## 2022-04-24 NOTE — Plan of Care (Signed)

## 2022-04-25 DIAGNOSIS — S4291XA Fracture of right shoulder girdle, part unspecified, initial encounter for closed fracture: Secondary | ICD-10-CM | POA: Diagnosis not present

## 2022-04-25 DIAGNOSIS — S92355A Nondisplaced fracture of fifth metatarsal bone, left foot, initial encounter for closed fracture: Secondary | ICD-10-CM | POA: Diagnosis not present

## 2022-04-25 DIAGNOSIS — D649 Anemia, unspecified: Secondary | ICD-10-CM | POA: Diagnosis not present

## 2022-04-25 DIAGNOSIS — S42291A Other displaced fracture of upper end of right humerus, initial encounter for closed fracture: Secondary | ICD-10-CM | POA: Diagnosis not present

## 2022-04-25 LAB — COMPREHENSIVE METABOLIC PANEL
ALT: 15 U/L (ref 0–44)
AST: 22 U/L (ref 15–41)
Albumin: 3.3 g/dL — ABNORMAL LOW (ref 3.5–5.0)
Alkaline Phosphatase: 53 U/L (ref 38–126)
Anion gap: 10 (ref 5–15)
BUN: 11 mg/dL (ref 8–23)
CO2: 22 mmol/L (ref 22–32)
Calcium: 8.7 mg/dL — ABNORMAL LOW (ref 8.9–10.3)
Chloride: 102 mmol/L (ref 98–111)
Creatinine, Ser: 0.66 mg/dL (ref 0.44–1.00)
GFR, Estimated: >60 mL/min (ref >60)
Glucose, Bld: 98 mg/dL (ref 70–99)
Potassium: 3.9 mmol/L (ref 3.5–5.1)
Sodium: 134 mmol/L — ABNORMAL LOW (ref 135–145)
Total Bilirubin: 1 mg/dL (ref 0.3–1.2)
Total Protein: 6.2 g/dL — ABNORMAL LOW (ref 6.5–8.1)

## 2022-04-25 LAB — RETICULOCYTES
Immature Retic Fract: 25.9 % — ABNORMAL HIGH (ref 2.3–15.9)
RBC.: 3.4 MIL/uL — ABNORMAL LOW (ref 3.87–5.11)
Retic Count, Absolute: 83 10*3/uL (ref 19.0–186.0)
Retic Ct Pct: 2.4 % (ref 0.4–3.1)

## 2022-04-25 LAB — IRON AND TIBC
Iron: 27 ug/dL — ABNORMAL LOW (ref 28–170)
Saturation Ratios: 9 % — ABNORMAL LOW (ref 10.4–31.8)
TIBC: 319 ug/dL (ref 250–450)
UIBC: 292 ug/dL

## 2022-04-25 LAB — HEMOGLOBIN AND HEMATOCRIT, BLOOD
HCT: 30.9 % — ABNORMAL LOW (ref 36.0–46.0)
Hemoglobin: 10.1 g/dL — ABNORMAL LOW (ref 12.0–15.0)

## 2022-04-25 LAB — VITAMIN B12: Vitamin B-12: 1874 pg/mL — ABNORMAL HIGH (ref 180–914)

## 2022-04-25 LAB — FOLATE: Folate: 9.8 ng/mL (ref >5.9)

## 2022-04-25 LAB — MAGNESIUM: Magnesium: 2.2 mg/dL (ref 1.7–2.4)

## 2022-04-25 LAB — FERRITIN: Ferritin: 70 ng/mL (ref 11–307)

## 2022-04-25 MED ORDER — POTASSIUM CHLORIDE CRYS ER 20 MEQ PO TBCR
20.0000 meq | EXTENDED_RELEASE_TABLET | Freq: Once | ORAL | Status: AC
  Administered 2022-04-25: 20 meq via ORAL
  Filled 2022-04-25: qty 1

## 2022-04-25 MED ORDER — POLYSACCHARIDE IRON COMPLEX 150 MG PO CAPS
150.0000 mg | ORAL_CAPSULE | Freq: Every day | ORAL | Status: DC
  Filled 2022-04-25: qty 1

## 2022-04-25 NOTE — TOC Progression Note (Signed)
Transition of Care [facility]) - Progression Note    Patient Details  Name: Elizabeth Mcconnell MRN: [id number] Date of Birth: Jul 26, 1945  Transition of Care [facility]) CM/SW [place], [place] Phone Number: 04/25/2022, 3:38 PM  Clinical Narrative:     CSW met with pt for decision on SNF vs HH. She chooses [facility]. CSW explains SNF auth process and anticipated DC tomorrow.   CSW confirmed with [facility]anda Busing; they can admit pt tomorrow if Josem Kaufmann is approved.   CSW submitted auth request in [place] portal. Pt in [place] under James Ivanoff. Auth Status: Pending   Expected Discharge Plan: [place] Barriers to Discharge: No Barriers Identified  Expected Discharge Plan and Services In-house Referral: NA Discharge Planning Services: NA Post Acute Care Choice: [place] arrangements for the past 2 months: Single Family Home                 DME Arranged: N/A DME Agency: NA                   Social Determinants of Health (SDOH) Interventions SDOH Screenings   Food Insecurity: No Food Insecurity (04/21/2022)  Housing: Low Risk  (04/21/2022)  Transportation Needs: No Transportation Needs (04/21/2022)  Utilities: Not At Risk (04/21/2022)  Depression (PHQ2-9): Low Risk  (10/14/2018)  Financial Resource Strain: Low Risk  (08/22/2017)  Physical Activity: Unknown (10/14/2018)  Social Connections: Moderately Integrated (08/22/2017)  Stress: No Stress Concern Present (10/14/2018)  Tobacco Use: Medium Risk (04/20/2022)    Readmission Risk Interventions    04/23/2022    1:53 PM  Readmission Risk Prevention Plan  Post Dischage Appt Complete  Medication Screening Complete  Transportation Screening Complete

## 2022-04-25 NOTE — TOC Progression Note (Addendum)
Transition of Care [facility]) - Progression Note    Patient Details  Name: Elizabeth Mcconnell MRN: [id number] Date of Birth: 07-Jan-1946  Transition of Care [facility]) CM/SW [place], [place] Phone Number: 04/25/2022, 12:19 PM  Clinical Narrative:     CSW met with pt and provided SNF bed offers. CSW discussed SNF vs HH extensively with pt and answered her questions. She remains undecided though states she is leaning towards going home with [facility]. CSW explained a decision would be needed today as she is medically ready for DC. Pt states her son is busy until 130pm and that she plans on calling him then to discuss decision. CSW to follow up later in afternoon.   1400: CSW met with pt bedside for follow up on SNF vs HH decision. Pt is on the phone with her son on speaker phone on arrival. Pt asks her son to speak with CSW but son did not want to. He was expressing frustration about the quality of nursing homes. Pt then took him off speaker phone. CSW waited until call ended. Pt expressed that a decision is needed as pt has been medically stable. CSW explains that insurance may not cover hospital days if they are not needed. CSW and pt reviewed bed offers and their medicare star ratings. Pt has bed offer from a 5 star facility in [place] and a 5 star facility in [place]. She also has bed offer for a 3 star facility in [place] and 3 star facility in [place]. Pt states she is going to call her sister and would have a decision this afternoon. CSW explained he would follow up between 3-4pm.   Expected Discharge Plan: Skilled Nursing Facility Barriers to Discharge: SNF Pending bed offer  Expected Discharge Plan and Services In-house Referral: NA Discharge Planning Services: NA Post Acute Care Choice: [place] arrangements for the past 2 months: Single Family Home                 DME Arranged: N/A DME Agency: NA                   Social  Determinants of Health (SDOH) Interventions SDOH Screenings   Food Insecurity: No Food Insecurity (04/21/2022)  Housing: Low Risk  (04/21/2022)  Transportation Needs: No Transportation Needs (04/21/2022)  Utilities: Not At Risk (04/21/2022)  Depression (PHQ2-9): Low Risk  (10/14/2018)  Financial Resource Strain: Low Risk  (08/22/2017)  Physical Activity: Unknown (10/14/2018)  Social Connections: Moderately Integrated (08/22/2017)  Stress: No Stress Concern Present (10/14/2018)  Tobacco Use: Medium Risk (04/20/2022)    Readmission Risk Interventions    04/23/2022    1:53 PM  Readmission Risk Prevention Plan  Post Dischage Appt Complete  Medication Screening Complete  Transportation Screening Complete

## 2022-04-25 NOTE — Progress Notes (Signed)
PROGRESS NOTE    Elizabeth Mcconnell  [id number] DOB: 03/23/1945 DOA: 04/20/2022 PCP: [name], MD   Brief Narrative:  Elizabeth Mcconnell is a 77 y.o. female with a history of atrial fibrillation on Eliquis and Tikosyn, anxiety, SBO, diverticulosis. Patient presented secondary to a fall, suffering right humerus and left fifth proximal metatarsal fractures. Orthopedic surgery consulted and recommending nonoperative management currently. Sling applied to right arm; non-weight bearing.  The OT recommending SNF now and currently awaiting placement as bed offers have been given and she still needs to make a decision along with obtaining insurance authorization and the patient has selected [facility] and they can admit the patient tomorrow if Josem Kaufmann is approved.  She is medically stable still.  Assessment and Plan:  Right Humerus head fracture -Secondary to fall.  -Orthopedic surgery consulted and recommend non-operative management, a sling and non-weight bearing status of RUE.  -PT/OT recommending acute inpatient rehabilitation. -PT/OT recommended CIR But now recommending SNF -Continue with Acetaminophen 650 mg po q6hprn mild pain and Oxycodone 2.5-5 mgprn  -Bed offers given to the patient and currently awaiting decision and insurance authorization for SNF placement   Left fifth proximal metatarsal fracture -Secondary to fall. Short leg splint applied in the ED.  -Orthopedic surgery consulted and are now recommending non-operative management, TDWB with heel while in post-op boot with a rolling walker only. -PT/OT recommending SNF    Hypoxia with a history of allergic rhinitis and history of chronic bronchitis -Asymptomatic. - Likely secondary to opioid analgesics and respiratory depression. Supplemental oxygen applied and now removed as patient was weaned to Room Air -Chest x-ray imaging without definitive infiltrate to suggest infection -SpO2: 92 % O2 Flow Rate (L/min): 2  L/min -Minimize narcotics and continue to monitor respiratory status carefully -Continue with Symbicort 2 puffs IH twice daily and Xopenex 0.63 mg nebs every 6 as needed for wheezing or shortness of breath -Continue fluticasone 2 sprays each nare twice daily as well as montelukast 10 mg p.o. daily   Generalized Anxiety -Continue Buspirone 10 mg po BID -Continue Alprazolam 0.5 mg po qHSprn Anxiety    Primary Hypertension -Continue Amlodipine 2.5 mg po Daily, losartan and nebivolol   Hyperlipidemia -Continue Pravastatin 40 mg po daily    Paroxysmal Atrial Fibrillation -Eliquis was held secondary to need for surgery this admission. -Continue Dofetilide 250 mcg po BID and Nebivolol 5 mg pi Daily  -Continue Anticoagulation with Apixaban 5 mg po BID now that she has non-operative management  -If necessary will place her on Telemetry    Confusion, improved significantly -Likely related to narcotic analgesics. -Regimen adjusted to just having Acetaminophen 650 mg po q6hprn Mild Pain and Oxycodone 2.5-5 mg po q4hprn Moderate Pain, Severe Pain and has improvement of mental status. -Delirium Precautions    Normocytic Anemia  -Mild and had a bleeding IV earlier this Admission -Hgb/Hct Trend: Recent Labs  Lab 04/20/22 0810 04/24/22 0944 04/25/22 0424  HGB 13.0 11.8* 10.1*  HCT 39.7 37.3 30.9*  MCV 89.8 93.5  --   -Anemia panel was checked and showed an iron level of 27, UIBC of 292, TIBC 319, saturation ratios of 9%, ferritin of 70, folate of 9.9, vitamin B12 1874 -We will place the patient on p.o. Niferex 150 mg daily -Continue to Monitor for S/Sx of bleeding; No overt bleeding  -Will not repeat CBC in the a.m. given the patient is stable   Hyponatremia -Mild. Na+ went from 136 -> 133 -> 134 -IV fluid hydration has now  stopped -Will not repeat CMP given the patient is stable   Metabolic Acidosis, improved  -Patient has a slight metabolic acidosis with a CO2 of 20, anion gap of 11,  chloride level of 102; now patient CO2 is 22, anion gap is 10, chloride level is 102 -Start IV Hydration with NS at 75 mL/hr x12 hours -Will not repeat CMP in a.m. given the patient stable  Hypoalbuminemia -Patient's Albumin Trend: Recent Labs  Lab 04/24/22 0944 04/25/22 0424  ALBUMIN 3.8 3.3*  -Continue to Monitor and Trend and will not repeat CMP in the a.m. that patient stable  Leukocytosis -Mild and Likely Reactive in the setting of Pain and Fall  -WBC Trend: Recent Labs  Lab 04/20/22 0810 04/24/22 0944  WBC 14.8* 9.0  -Continue to Monitor for S/Sx of Infection; no overt infection noted -Will not repeat CBC in the a.m. because patient is stable  DVT prophylaxis: SCDs Start: 04/21/22 0539 apixaban (ELIQUIS) tablet 5 mg    Code Status: Full Code Family Communication: No family currently at bedside  Disposition Plan:  Level of care: Med-Surg Status is: Inpatient Remains inpatient appropriate because: She is medically stable for discharge and still needs in front authorization now that she has selected a skilled nursing facility   Consultants:  Orthopedic surgery  Procedures:  As delineated as above  Antimicrobials:  Anti-infectives (From admission, onward)    None       Subjective: Seen and examined at bedside and she is sitting in the chair and speaking with her sister.  Thinks her pain is doing little bit better.  No nausea or vomiting.  Still try to figure out what to do with her to go home with home health or go to SNF but she does not want to go to.  Wants to speak with the social worker again later this afternoon.  Denies any other concerns or complaints this time and remains medically stable to be discharged once bed is secure and insurance authorization is obtained.  Objective: Vitals:   04/24/22 2045 04/25/22 0436 04/25/22 0828 04/25/22 1243  BP:  (!) 141/57  (!) 165/75  Pulse:  70  66  Resp:  20  16  Temp:  98.1 F (36.7 C)  98 F (36.7 C)   TempSrc:  Oral  Oral  SpO2: 93% 93% 93% 92%  Weight:      Height:       No intake or output data in the 24 hours ending 04/25/22 1653 Filed Weights   04/21/22 0341  Weight: 72.3 kg   Examination: Physical Exam:  Constitutional: WN/WD overweight Caucasian female currently no acute distress Respiratory: Diminished to auscultation bilaterally, no wheezing, rales, rhonchi or crackles. Normal respiratory effort and patient is not tachypenic. No accessory muscle use.  Unlabored breathing Cardiovascular: RRR, no murmurs / rubs / gallops. S1 and S2 auscultated. No extremity edema. Abdomen: Soft, non-tender, distended secondary to body habitus hepatosplenomegaly. Bowel sounds positive.  GU: Deferred. Musculoskeletal: Right arm is in a sling and he has a special shoe on her left foot Skin: No rashes, lesions, ulcers on limited skin evaluation. No induration; Warm and dry.  Neurologic: CN 2-12 grossly intact with no focal deficits. Romberg sign and cerebellar reflexes not assessed.  Psychiatric: Normal judgment and insight. Alert and oriented x 3.  Anxious mood and appropriate affect.   Data Reviewed: I have personally reviewed following labs and imaging studies  CBC: Recent Labs  Lab 04/20/22 0810 04/24/22 0944 04/25/22 0424  WBC 14.8* 9.0  --   NEUTROABS  --  7.3  --   HGB 13.0 11.8* 10.1*  HCT 39.7 37.3 30.9*  MCV 89.8 93.5  --   PLT 175 158  --    Basic Metabolic Panel: Recent Labs  Lab 04/20/22 0810 04/24/22 0944 04/25/22 0424  NA 136 133* 134*  K 4.3 3.9 3.9  CL 104 102 102  CO2 22 20* 22  GLUCOSE 120* 122* 98  BUN 11 10 11  $ CREATININE 0.72 0.63 0.66  CALCIUM 9.8 8.9 8.7*  MG  --  2.1 2.2  PHOS  --  3.6  --    GFR: Estimated Creatinine Clearance: 57.7 mL/min (by C-G formula based on SCr of 0.66 mg/dL). Liver Function Tests: Recent Labs  Lab 04/24/22 0944 04/25/22 0424  AST 27 22  ALT 16 15  ALKPHOS 56 53  BILITOT 1.1 1.0  PROT 6.9 6.2*  ALBUMIN 3.8 3.3*    No results for input(s): "LIPASE", "AMYLASE" in the last 168 hours. No results for input(s): "AMMONIA" in the last 168 hours. Coagulation Profile: No results for input(s): "INR", "PROTIME" in the last 168 hours. Cardiac Enzymes: No results for input(s): "CKTOTAL", "CKMB", "CKMBINDEX", "TROPONINI" in the last 168 hours. BNP (last 3 results) No results for input(s): "PROBNP" in the last 8760 hours. HbA1C: No results for input(s): "HGBA1C" in the last 72 hours. CBG: No results for input(s): "GLUCAP" in the last 168 hours. Lipid Profile: No results for input(s): "CHOL", "HDL", "LDLCALC", "TRIG", "CHOLHDL", "LDLDIRECT" in the last 72 hours. Thyroid Function Tests: No results for input(s): "TSH", "T4TOTAL", "FREET4", "T3FREE", "THYROIDAB" in the last 72 hours. Anemia Panel: Recent Labs    04/25/22 0424  VITAMINB12 1,874*  FOLATE 9.8  FERRITIN 70  TIBC 319  IRON 27*  RETICCTPCT 2.4   Sepsis Labs: No results for input(s): "PROCALCITON", "LATICACIDVEN" in the last 168 hours.  No results found for this or any previous visit (from the past 240 hour(s)).   Radiology Studies: No results found.  Scheduled Meds:  amLODipine  2.5 mg Oral Daily   apixaban  5 mg Oral BID   budesonide-formoterol  2 puff Inhalation BID   busPIRone  10 mg Oral BID   dofetilide  250 mcg Oral BID   fluticasone  2 spray Each Nare BID   iron polysaccharides  150 mg Oral Daily   losartan  100 mg Oral Daily   montelukast  10 mg Oral Daily   nebivolol  5 mg Oral Daily   potassium chloride  20 mEq Oral Daily   pravastatin  40 mg Oral q1800   Continuous Infusions:   LOS: 4 days   Raiford Noble, DO [facility] Available via Epic secure chat 7am-7pm After these hours, please refer to coverage provider listed on [email or web address] 04/25/2022, 4:53 PM

## 2022-04-25 NOTE — Care Management Important Message (Signed)
Important Message  Patient Details IM Letter given Name: Elizabeth Mcconnell MRN: [id number] Date of Birth: Jan 28, 1946   Medicare Important Message Given:  Yes     Kerin Salen 04/25/2022, 11:50 AM

## 2022-04-25 NOTE — Plan of Care (Signed)
  Problem: Education: Goal: Knowledge of General Education information will improve Description Including pain rating scale, medication(s)/side effects and non-pharmacologic comfort measures Outcome: Progressing   Problem: Health Behavior/Discharge Planning: Goal: Ability to manage health-related needs will improve Outcome: Progressing   

## 2022-04-26 MED ORDER — ALPRAZOLAM 0.25 MG PO TABS: 0.2500 mg | ORAL_TABLET | Freq: Every evening | ORAL | 0 refills | Status: AC | PRN

## 2022-04-26 MED ORDER — POLYSACCHARIDE IRON COMPLEX 150 MG PO CAPS: 150.0000 mg | ORAL_CAPSULE | Freq: Every day | ORAL | 0 refills | Status: DC

## 2022-04-26 NOTE — TOC Transition Note (Addendum)
Transition of Care [facility]) - CM/SW Discharge Note   Patient Details  Name: Elizabeth Mcconnell MRN: [id number] Date of Birth: Feb 07, 1946  Transition of Care [facility]) CM/SW Contact:  Illene Regulus, LCSW Phone Number: 04/26/2022, 12:37 PM   Clinical Narrative:    Pt's PASRR [id number] A. CSW attempted to call pt's son Shela Leff to inquire about transportation assistance. No answer Left HIPAA complaint VM. TOC to follow.   Adden  12:56pm CSW spoke with pt's son who reported he will provided transportation to the facility. No additional needs TOC sign off.      Final next level of care: Skilled Nursing Facility Barriers to Discharge: No Barriers Identified   Patient Goals and CMS Choice CMS Medicare.gov Compare Post Acute Care list provided to:: Patient Choice offered to / list presented to : Patient  Discharge Placement                Patient chooses bed at: [facility]     Patient and family notified of of transfer: 04/26/22  Discharge Plan and Services Additional resources added to the After Visit Summary for   In-house Referral: NA Discharge Planning Services: NA Post Acute Care Choice: [place]          DME Arranged: N/A DME Agency: NA                  Social Determinants of Health (SDOH) Interventions SDOH Screenings   Food Insecurity: No Food Insecurity (04/21/2022)  Housing: Low Risk  (04/21/2022)  Transportation Needs: No Transportation Needs (04/21/2022)  Utilities: Not At Risk (04/21/2022)  Depression (PHQ2-9): Low Risk  (10/14/2018)  Financial Resource Strain: Low Risk  (08/22/2017)  Physical Activity: Unknown (10/14/2018)  Social Connections: Moderately Integrated (08/22/2017)  Stress: No Stress Concern Present (10/14/2018)  Tobacco Use: Medium Risk (04/20/2022)     Readmission Risk Interventions    04/23/2022    1:53 PM  Readmission Risk Prevention Plan  Post Dischage Appt Complete   Medication Screening Complete  Transportation Screening Complete

## 2022-04-26 NOTE — TOC Progression Note (Signed)
Transition of Care [facility]) - Progression Note    Patient Details  Name: Elizabeth Mcconnell MRN: [id number] Date of Birth: [birth year]  Transition of Care [facility]) CM/SW [place], LCSW Phone Number: 04/26/2022, 10:48 AM  Clinical Narrative:     Pt insurance Josem Kaufmann was approved. CSW spoke with Wendi with [facility], ot can d/c to facility today. Will need D/c summary by 2pm. MD and RN made aware.TOC to follow.  Expected Discharge Plan: [place] Barriers to Discharge: No Barriers Identified  Expected Discharge Plan and Services In-house Referral: NA Discharge Planning Services: NA Post Acute Care Choice: [place] arrangements for the past 2 months: Single Family Home                 DME Arranged: N/A DME Agency: NA                   Social Determinants of Health (SDOH) Interventions SDOH Screenings   Food Insecurity: No Food Insecurity (04/21/2022)  Housing: Low Risk  (04/21/2022)  Transportation Needs: No Transportation Needs (04/21/2022)  Utilities: Not At Risk (04/21/2022)  Depression (PHQ2-9): Low Risk  (10/14/2018)  Financial Resource Strain: Low Risk  (08/22/2017)  Physical Activity: Unknown (10/14/2018)  Social Connections: Moderately Integrated (08/22/2017)  Stress: No Stress Concern Present (10/14/2018)  Tobacco Use: Medium Risk (04/20/2022)    Readmission Risk Interventions    04/23/2022    1:53 PM  Readmission Risk Prevention Plan  Post Dischage Appt Complete  Medication Screening Complete  Transportation Screening Complete

## 2022-04-26 NOTE — Progress Notes (Signed)
Occupational Therapy Treatment Patient Details Name: Elizabeth Mcconnell MRN: [id number] DOB: 1945-04-16 Today's Date: 04/26/2022   History of present illness Elizabeth Mcconnell is a 77 y.o. female presents after trip and fall on uneven surface. Pt with R humerus head fracture and L 5th proximal metatarsal fracture. PMH: A-fib, diverticulosis, small bowel obstruction, HTN, osteopenia   OT comments  Continues to have a lot of pain in RUE and increased swelling and bruising. Applied ice at end of treatment and encouraged to request ice and use ice packs frequently during recovery. Rest of treatment focused on ADLs. She still needs assistance due to significant pain in RUE and cues to not MOVE shoulder to allow for healing. Continue to recommend short term rehab at discharge.    Recommendations for follow up therapy are one component of a multi-disciplinary discharge planning process, led by the attending physician.  Recommendations may be updated based on patient status, additional functional criteria and insurance authorization.    Follow Up Recommendations  Skilled nursing-short term rehab (<3 hours/day)     Assistance Recommended at Discharge Frequent or constant Supervision/Assistance  Patient can return home with the following  Assistance with cooking/housework;Direct supervision/assist for medications management;Assist for transportation;Help with stairs or ramp for entrance;Direct supervision/assist for financial management;A lot of help with walking and/or transfers   Equipment Recommendations  None recommended by OT    Recommendations for Other Services      Precautions / Restrictions Precautions Precautions: Fall Required Braces or Orthoses: Sling;Other Brace Other Brace: RUE in sling, L foot in post-op shoe Restrictions Weight Bearing Restrictions: Yes RUE Weight Bearing: Non weight bearing Other Position/Activity Restrictions: 2/19 ortho note: plan on heel weightbearing  in postop shoe LLE       Mobility Bed Mobility                    Transfers                         Balance                                           ADL either performed or assessed with clinical judgement   ADL Overall ADL's : Needs assistance/impaired     Grooming: Set up;Standing;Oral care Grooming Details (indicate cue type and reason): at sink Upper Body Bathing: Maximal assistance;Sitting Upper Body Bathing Details (indicate cue type and reason): assisted with bathing task seated on toilet. Continues to have significant pain Lower Body Bathing: Moderate assistance;Sit to/from stand Lower Body Bathing Details (indicate cue type and reason): decreased assistance required for LB bathing - therapist assisted with back side and feet Upper Body Dressing : Maximal assistance;Sitting Upper Body Dressing Details (indicate cue type and reason): to don gown. Reiterated no shoulder movement with dressing and donning sling     Toilet Transfer: Minimal assistance;Regular Glass blower/designer Details (indicate cue type and reason): Needed verbal cues to weight bear through heel Toileting- Clothing Manipulation and Hygiene: Sit to/from stand;Moderate assistance Toileting - Clothing Manipulation Details (indicate cue type and reason): mod assist for toileting - mostly clothing     Functional mobility during ADLs: Minimal assistance      Extremity/Trunk Assessment Upper Extremity Assessment RUE Deficits / Details: conservative measures with noted large brusing on distal upper arm and elbow area. no ROM per conservative measures. sling in  place            Vision Patient Visual Report: No change from baseline     Perception     Praxis      Cognition Arousal/Alertness: Awake/alert Behavior During Therapy: WFL for tasks assessed/performed Overall Cognitive Status: Within Functional Limits for tasks assessed                                           Exercises      Shoulder Instructions       General Comments      Pertinent Vitals/ Pain       Pain Assessment Pain Assessment: No/denies pain  Home Living                                          Prior Functioning/Environment              Frequency  Min 2X/week        Progress Toward Goals  OT Goals(current goals can now be found in the care plan section)  Progress towards OT goals: Goals drowngraded-see care plan  Acute Rehab OT Goals Patient Stated Goal: less pain in arm OT Goal Formulation: With patient Time For Goal Achievement: 05/07/22 Potential to Achieve Goals: Good  Plan Discharge plan remains appropriate    Co-evaluation                 AM-PAC OT "6 Clicks" Daily Activity     Outcome Measure   Help from another person eating meals?: A Little Help from another person taking care of personal grooming?: A Little Help from another person toileting, which includes using toliet, bedpan, or urinal?: A Lot Help from another person bathing (including washing, rinsing, drying)?: A Lot Help from another person to put on and taking off regular upper body clothing?: A Lot Help from another person to put on and taking off regular lower body clothing?: A Lot 6 Click Score: 14    End of Session Equipment Utilized During Treatment: Gait belt  OT Visit Diagnosis: Unsteadiness on feet (R26.81);Other abnormalities of gait and mobility (R26.89);Muscle weakness (generalized) (M62.81)   Activity Tolerance Patient tolerated treatment well   Patient Left in chair;with call bell/phone within reach;with chair alarm set;with nursing/sitter in room   Nurse Communication          Time: [id number] OT Time Calculation (min): 39 min  Charges: OT General Charges $OT Visit: 1 Visit OT Treatments $Self Care/Home Management : 38-52 mins  Gustavo Lah, OTR/L [place]  Office [phone]    Lenward Chancellor 04/26/2022, 1:58 PM

## 2022-04-26 NOTE — Discharge Summary (Signed)
Physician Discharge Summary   Patient: Elizabeth Mcconnell MRN: [id number] DOB: 1946-02-11  Admit date:     04/20/2022  Discharge date: 04/26/22  Discharge Physician: Raiford Noble, DO    PCP: [name], MD   Recommendations at discharge:   Follow-up with PCP within 1 to 2 weeks and repeat CBC, CMP, mag, Phos within 1 week Follow-up with [facility] clinic within 1 week of discharge Continue heel weightbearing in a postop shoe in the left lower extremity at all times Continue with sling and nonweightbearing in the right upper extremity  Discharge Diagnoses: Principal Problem:   Humerus head fracture Active Problems:   Shoulder fracture   Closed nondisplaced fracture of fifth left metatarsal bone  Resolved Problems:   * No resolved hospital problems. *  Hospital Course: Iriana Dahm is a 77 y.o. female with a history of atrial fibrillation on Eliquis and Tikosyn, anxiety, SBO, diverticulosis. Patient presented secondary to a fall, suffering right humerus and left fifth proximal metatarsal fractures. Orthopedic surgery consulted and recommending nonoperative management currently. Sling applied to right arm; non-weight bearing.  The OT recommending SNF now and currently awaiting placement as bed offers have been given and she still needs to make a decision along with obtaining insurance authorization and the patient has selected [facility] and they can admit the patient tomorrow if Josem Kaufmann is approved.  She is medically stable still and bed is available today  Assessment and Plan:  Right Humerus head fracture -Secondary to fall.  -Orthopedic surgery consulted and recommend non-operative management, a sling and non-weight bearing status of RUE.  -PT/OT recommending acute inpatient rehabilitation. -PT/OT recommended CIR But now recommending SNF -Continue with Acetaminophen 650 mg po q6hprn mild pain and Oxycodone 2.5-5 mgprn  -SNF has been selected and bed and authorization  has been obtained so she is stable for discharge   Left fifth proximal metatarsal fracture -Secondary to fall. Short leg splint applied in the ED.  -Orthopedic surgery consulted and are now recommending non-operative management, TDWB with heel while in post-op boot with a rolling walker only. -PT/OT recommending SNF    Hypoxia with a history of allergic rhinitis and history of chronic bronchitis and COPD -Asymptomatic. - Likely secondary to opioid analgesics and respiratory depression. Supplemental oxygen applied and now removed as patient was weaned to Room Air -Chest x-ray imaging without definitive infiltrate to suggest infection SpO2: 95 % O2 Flow Rate (L/min): 2 L/min; Now off of Supplemental O2 via [place] -Minimize narcotics and continue to monitor respiratory status carefully -Continue with Symbicort 2 puffs IH twice daily and Xopenex 0.63 mg nebs every 6 as needed for wheezing or shortness of breath -Continue fluticasone 2 sprays each nare twice daily as well as montelukast 10 mg p.o. daily -Follow up with Pulmonary in the outpatient setting and repeat CXR within 3-6 weeks   Generalized Anxiety -Continue Buspirone 10 mg po BID -Continue Alprazolam 0.5 mg po qHSprn Anxiety    Primary Hypertension -Continue Amlodipine 2.5 mg po Daily, losartan and nebivolol   Hyperlipidemia -Continue Pravastatin 40 mg po daily    Paroxysmal Atrial Fibrillation -Eliquis was held secondary to need for surgery this admission. -Continue Dofetilide 250 mcg po BID and Nebivolol 5 mg pi Daily  -Continue Anticoagulation with Apixaban 5 mg po BID now that she has non-operative management  -If necessary will place her on Telemetry    Confusion, improved significantly -Likely related to narcotic analgesics. -Regimen adjusted to just having Acetaminophen 650 mg po q6hprn Mild Pain and  Oxycodone 2.5-5 mg po q4hprn Moderate Pain, Severe Pain and has improvement of mental status. -Delirium Precautions     Normocytic Anemia  -Mild and had a bleeding IV earlier this Admission -Hgb/Hct Trend: Recent Labs  Lab 04/20/22 0810 04/24/22 0944 04/25/22 0424  HGB 13.0 11.8* 10.1*  HCT 39.7 37.3 30.9*  MCV 89.8 93.5  --   -Anemia panel was checked and showed an iron level of 27, UIBC of 292, TIBC 319, saturation ratios of 9%, ferritin of 70, folate of 9.9, vitamin B12 1874 -We will place the patient on p.o. Niferex 150 mg daily -Continue to Monitor for S/Sx of bleeding; No overt bleeding  -Repeat CBC within 1 week    Hyponatremia -Mild. Na+ went from 136 -> 133 -> 134 -IV fluid hydration has now stopped -Repeat CMP within 1 week    Metabolic Acidosis, improved  -Patient has a slight metabolic acidosis with a CO2 of 20, anion gap of 11, chloride level of 102; now patient CO2 is 22, anion gap is 10, chloride level is 102 on last check  -Start IV Hydration with NS at 75 mL/hr x12 hours -Will not repeat CMP in a.m. given the patient stable but can be repeated in the outpatient setting    Hypoalbuminemia -Patient's Albumin Trend: Recent Labs  Lab 04/24/22 0944 04/25/22 0424  ALBUMIN 3.8 3.3*  -Continue to Monitor and Trend and will not repeat CMP in the a.m. that patient stable   Leukocytosis -Mild and Likely Reactive in the setting of Pain and Fall  -WBC Trend: Recent Labs  Lab 04/20/22 0810 04/24/22 0944  WBC 14.8* 9.0  -Continue to Monitor for S/Sx of Infection; no overt infection noted -Repeat CBC within 1 week   Overweight -Complicates overall prognosis and care -Estimated body mass index is 26.53 kg/m as calculated from the following:   Height as of this encounter: '5\' 5"'$  (1.651 m).   Weight as of this encounter: 72.3 kg.  -Weight Loss and Dietary Counseling given  Consultants: Orthopedic Surgery  Procedures performed: As delineated as above  Disposition: Skilled nursing facility Diet recommendation:  Cardiac diet DISCHARGE MEDICATION: Allergies as of 04/26/2022        Reactions   Tramadol Other (See Comments)   Reaction:  Dizziness and weakness    Amoxicillin-pot Clavulanate Nausea And Vomiting, Other (See Comments)   Has patient had a PCN reaction causing immediate rash, facial/tongue/throat swelling, SOB or lightheadedness with hypotension: No Has patient had a PCN reaction causing severe rash involving mucus membranes or skin necrosis: No Has patient had a PCN reaction that required hospitalization No Has patient had a PCN reaction occurring within the last 10 years: No If all of the above answers are "NO", then may proceed with Cephalosporin use.   Amoxicillin Other (See Comments)   Flagyl [metronidazole] Nausea And Vomiting   Flecainide Other (See Comments)   Dizziness    Gabapentin    Shaking all over, fingers were numb and trouble breathing   Omnicef [cefdinir] Nausea And Vomiting   Avelox [moxifloxacin] Hives, Itching   Clavulanic Acid Other (See Comments)        Medication List     TAKE these medications    acetaminophen 650 MG CR tablet Commonly known as: TYLENOL Take 650 mg by mouth at bedtime. What changed: Another medication with the same name was added. Make sure you understand how and when to take each.   acetaminophen 325 MG tablet Commonly known as: Tylenol Take 2 tablets (  650 mg total) by mouth every 6 (six) hours as needed for up to 30 doses for moderate pain or mild pain. What changed: You were already taking a medication with the same name, and this prescription was added. Make sure you understand how and when to take each.   ALPRAZolam 0.25 MG tablet Commonly known as: XANAX Take 1 tablet (0.25 mg total) by mouth at bedtime as needed for anxiety. TAKE 1 TABLET BY MOUTH AT BEDTIME AS NEEDED FOR ANXIETY OR SLEEP Strength: 0.25 mg What changed: See the new instructions.   amLODipine 2.5 MG tablet Commonly known as: NORVASC TAKE 1 TABLET BY MOUTH EVERY DAY   budesonide-formoterol 160-4.5 MCG/ACT inhaler Commonly  known as: Symbicort INHALE 2 PUFFS FIRST THING in THE MORNING, THEN INHALE 2 PUFFS 12 HOURS LATER What changed:  how much to take how to take this   busPIRone 10 MG tablet Commonly known as: BUSPAR Take 10 mg by mouth daily.   Bystolic 5 MG tablet Generic drug: nebivolol TAKE 1 TABLET BY MOUTH EVERY DAY What changed: how much to take   Calcium Carb-Cholecalciferol 600-800 MG-UNIT Tabs Take 1 tablet by mouth daily.   cyanocobalamin 1000 MCG tablet Commonly known as: VITAMIN B12 Take 1,000 mcg by mouth daily.   dofetilide 250 MCG capsule Commonly known as: TIKOSYN TAKE 1 CAPSULE BY MOUTH 2 TIMES DAILY   Eliquis 5 MG Tabs tablet Generic drug: apixaban TAKE 1 TABLET BY MOUTH 2 TIMES DAILY What changed: how much to take   fluticasone 50 MCG/ACT nasal spray Commonly known as: FLONASE USE 2 SPRAYS IN EACH NOSTRIL 2 TIMES DAILY What changed:  how much to take how to take this when to take this additional instructions   iron polysaccharides 150 MG capsule Commonly known as: NIFEREX Take 1 capsule (150 mg total) by mouth daily. Start taking on: April 27, 2022   levalbuterol 45 MCG/ACT inhaler Commonly known as: XOPENEX HFA inhale 1-2 PUFFS INTO THE LUNGS EVERY 4 HOURS AS NEEDED What changed: See the new instructions.   losartan 100 MG tablet Commonly known as: COZAAR TAKE 1 TABLET BY MOUTH EVERY DAY   magnesium oxide 400 MG tablet Commonly known as: MAG-OX Take 1 tablet (400 mg total) by mouth daily.   montelukast 10 MG tablet Commonly known as: SINGULAIR TAKE 1 TABLET BY MOUTH EVERY DAY   oxyCODONE 5 MG immediate release tablet Commonly known as: Roxicodone Take 1 tablet (5 mg total) by mouth every 8 (eight) hours as needed for up to 15 doses for severe pain.   potassium chloride 10 MEQ tablet Commonly known as: KLOR-CON TAKE 2 TABLETS BY MOUTH EVERY DAY What changed: how much to take   pravastatin 40 MG tablet Commonly known as: PRAVACHOL Take 40 mg  by mouth daily.   traZODone 50 MG tablet Commonly known as: DESYREL Take 50-100 mg by mouth at bedtime as needed for sleep.        Contact information for after-discharge care     [facility] .   Service: Skilled Nursing Contact information: [address] [place] [phone]                    Discharge Exam: Danley Danker Weights   04/21/22 0341  Weight: 72.3 kg   Vitals:   04/26/22 0408 04/26/22 1043  BP: (!) 150/68 126/70  Pulse: 70   Resp: 18   Temp: 98.2  F (36.8 C)   SpO2: 95%    Examination: Physical Exam:  Constitutional: WN/WD overweight Caucasian female who is very anxious Respiratory: Diminished to auscultation bilaterally with slight wheezing; No appreciable rales, rhonchi or crackles. Normal respiratory effort and patient is not tachypenic. No accessory muscle use.  Cardiovascular: RRR, no murmurs / rubs / gallops. S1 and S2 auscultated. Trace extremity edema Abdomen: Soft, non-tender,Distended 2/2 body habitus. Bowel sounds positive.  GU: Deferred. Musculoskeletal: Right Arm is in a slight and her shoe is off of her left foot Skin: Has some bruising in th Right Upper Extremity. No induration; Warm and dry.  Neurologic: CN 2-12 grossly intact with no focal deficits. Romberg sign and cerebellar reflexes not assessed.  Psychiatric: Normal judgment and insight. Alert and oriented x 3. Normal mood and appropriate affect.   Condition at discharge: stable  The results of significant diagnostics from this hospitalization (including imaging, microbiology, ancillary and laboratory) are listed below for reference.   Imaging Studies: DG CHEST PORT 1 VIEW  Result Date: 04/22/2022 CLINICAL DATA:  Hypoxia EXAM: PORTABLE CHEST 1 VIEW COMPARISON:  Portable exam 0024 hours compared to 04/20/2022 FINDINGS: Normal heart size and pulmonary vascularity. Superior elevation of LEFT hilum with  significant LEFT upper lobe volume loss and pleuroparenchymal scarring. Remaining lungs appear emphysematous but clear. No acute infiltrate, pleural effusion, or pneumothorax. Comminuted RIGHT humeral head/neck fracture unchanged since 04/20/2022. Osseous demineralization with levoconvex thoracic scoliosis. IMPRESSION: Stable LEFT upper lobe scarring and volume loss. Underlying emphysematous changes. Subacute comminuted proximal RIGHT humeral fracture. Electronically Signed   By: Lavonia Dana M.D.   On: 04/22/2022 09:14   CT FOOT LEFT WO CONTRAST  Result Date: 04/22/2022 CLINICAL DATA:  Fracture, foot EXAM: CT OF THE LEFT FOOT WITHOUT CONTRAST TECHNIQUE: Multidetector CT imaging of the left foot was performed according to the standard protocol. Multiplanar CT image reconstructions were also generated. RADIATION DOSE REDUCTION: This exam was performed according to the departmental dose-optimization program which includes automated exposure control, adjustment of the mA and/or kV according to patient size and/or use of iterative reconstruction technique. COMPARISON:  Radiograph 04/20/2022. FINDINGS: Bones/Joint/Cartilage There is a nondisplaced fracture at the distal aspect of the fifth toe proximal phalanx. There is a nondisplaced fracture at the base of the fifth metatarsal extending into the TMT joint. Ligaments Suboptimally assessed by CT. Muscles and Tendons No acute myotendinous abnormality by CT. Soft tissues Soft tissue swelling of the foot and ankle. IMPRESSION: Nondisplaced intra-articular fracture of the base of the fifth metatarsal. Nondisplaced fracture of the distal aspect of the fifth toe proximal phalanx. Electronically Signed   By: Maurine Simmering M.D.   On: 04/22/2022 08:47   DG Chest Portable 1 View  Result Date: 04/21/2022 CLINICAL DATA:  low oxygen sats EXAM: PORTABLE CHEST 1 VIEW.  Patient is rotated. COMPARISON:  Chest x-ray 12/31/2021 FINDINGS: The heart and mediastinal contours are grossly  unchanged evaluation due to patient rotation. Aortic calcification. Possible hazy airspace opacity at the left base and possible pleural effusion. No pulmonary edema. No right pleural effusion. No pneumothorax. No acute osseous abnormality. IMPRESSION: 1. Possible hazy airspace opacity at the left base and possible left pleural effusion with limited evaluation due to patient rotation. Recommend repeat PA and lateral view of the chest. 2.  Aortic Atherosclerosis (ICD10-I70.0). Electronically Signed   By: Iven Finn M.D.   On: 04/21/2022 00:14   DG Toe 5th Left  Result Date: 04/20/2022 CLINICAL DATA:  Fifth toe pain with known fifth  metatarsal fracture. EXAM: DG TOE 5TH LEFT COMPARISON:  Films from earlier in the same day. FINDINGS: There is a lucency along the lateral aspect of the distal fifth proximal phalanx suspicious for undisplaced fracture. No other fracture seen. The known proximal fifth metatarsal fracture is not appreciated on this exam. IMPRESSION: Lucency in the fifth proximal phalanx distally suspicious for undisplaced fracture. Electronically Signed   By: Inez Catalina M.D.   On: 04/20/2022 18:46   DG Foot 2 Views Left  Result Date: 04/20/2022 CLINICAL DATA:  a tripped and fell. Lateral foot pain. Patient on Eliquis. EXAM: LEFT FOOT - 2 VIEW COMPARISON:  None Available. FINDINGS: Fracture, nondisplaced, across the base of the fifth metatarsal, predominantly across the metaphysis, a Jones fracture. No other fracture.  No bone lesion. Asymmetric joint space narrowing of the first metatarsal phalangeal joint. Remaining joints are normally spaced and aligned. Small dorsal and plantar calcaneal spurs. Mild lateral soft tissue swelling. IMPRESSION: 1. Nondisplaced fracture across the proximal fifth metatarsal, across the metaphysis. 2. No other fractures.  No dislocation. Electronically Signed   By: Lajean Manes M.D.   On: 04/20/2022 17:38   DG Shoulder Right  Result Date: 04/20/2022 CLINICAL  DATA:  Tripped and fell.  Shoulder pain EXAM: RIGHT SHOULDER - 2+ VIEW COMPARISON:  None Available. FINDINGS: Comminuted fracture of the RIGHT humeral head. Impaction type fracture. Avulsion of the greater tuberosity. Subluxation of the humeral head in the glenohumeral joint inferiorly suggest hemarthrosis. IMPRESSION: 1. Mildly comminuted impaction fracture of the RIGHT humeral head with avulsion of the greater tuberosity. 2. Hemarthrosis with inferior subluxation. Electronically Signed   By: Suzy Bouchard M.D.   On: 04/20/2022 17:31    Microbiology: Results for orders placed or performed during the hospital encounter of 06/30/21  Resp Panel by RT-PCR (Flu A&B, Covid) Nasopharyngeal Swab     Status: None   Collection Time: 06/30/21  5:27 PM   Specimen: Nasopharyngeal Swab; Nasopharyngeal(NP) swabs in vial transport medium  Result Value Ref Range Status   SARS Coronavirus 2 by RT PCR NEGATIVE NEGATIVE Final    Comment: (NOTE) SARS-CoV-2 target nucleic acids are NOT DETECTED.  The SARS-CoV-2 RNA is generally detectable in upper respiratory specimens during the acute phase of infection. The lowest concentration of SARS-CoV-2 viral copies this assay can detect is 138 copies/mL. A negative result does not preclude SARS-Cov-2 infection and should not be used as the sole basis for treatment or other patient management decisions. A negative result may occur with  improper specimen collection/handling, submission of specimen other than nasopharyngeal swab, presence of viral mutation(s) within the areas targeted by this assay, and inadequate number of viral copies(<138 copies/mL). A negative result must be combined with clinical observations, patient history, and epidemiological information. The expected result is Negative.  Fact Sheet for Patients:  EntrepreneurPulse.com.au  Fact Sheet for Healthcare Providers:  IncredibleEmployment.be  This test is no t  yet approved or cleared by the [place] FDA and  has been authorized for detection and/or diagnosis of SARS-CoV-2 by FDA under an Emergency Use Authorization (EUA). This EUA will remain  in effect (meaning this test can be used) for the duration of the COVID-19 declaration under Section 564(b)(1) of the Act, 21 U.S.C.section 360bbb-3(b)(1), unless the authorization is terminated  or revoked sooner.       Influenza A by PCR NEGATIVE NEGATIVE Final   Influenza B by PCR NEGATIVE NEGATIVE Final    Comment: (NOTE) The Xpert Xpress SARS-CoV-2/FLU/RSV plus assay is  intended as an aid in the diagnosis of influenza from Nasopharyngeal swab specimens and should not be used as a sole basis for treatment. Nasal washings and aspirates are unacceptable for Xpert Xpress SARS-CoV-2/FLU/RSV testing.  Fact Sheet for Patients: EntrepreneurPulse.com.au  Fact Sheet for Healthcare Providers: IncredibleEmployment.be  This test is not yet approved or cleared by the [place] FDA and has been authorized for detection and/or diagnosis of SARS-CoV-2 by FDA under an Emergency Use Authorization (EUA). This EUA will remain in effect (meaning this test can be used) for the duration of the COVID-19 declaration under Section 564(b)(1) of the Act, 21 U.S.C. section 360bbb-3(b)(1), unless the authorization is terminated or revoked.  Performed at [facility], [place] [address], [place], [place] [address]    Labs: CBC: Recent Labs  Lab 04/20/22 0810 04/24/22 0944 04/25/22 0424  WBC 14.8* 9.0  --   NEUTROABS  --  7.3  --   HGB 13.0 11.8* 10.1*  HCT 39.7 37.3 30.9*  MCV 89.8 93.5  --   PLT 175 158  --    Basic Metabolic Panel: Recent Labs  Lab 04/20/22 0810 04/24/22 0944 04/25/22 0424  NA 136 133* 134*  K 4.3 3.9 3.9  CL 104 102 102  CO2 22 20* 22  GLUCOSE 120* 122* 98  BUN '11 10 11  '$ CREATININE 0.72 0.63 0.66  CALCIUM 9.8 8.9 8.7*  MG  --  2.1  2.2  PHOS  --  3.6  --    Liver Function Tests: Recent Labs  Lab 04/24/22 0944 04/25/22 0424  AST 27 22  ALT 16 15  ALKPHOS 56 53  BILITOT 1.1 1.0  PROT 6.9 6.2*  ALBUMIN 3.8 3.3*   CBG: No results for input(s): "GLUCAP" in the last 168 hours.  Discharge time spent: greater than 30 minutes.  Signed: Raiford Noble, DO [facility] 04/26/2022

## 2022-04-26 NOTE — Plan of Care (Signed)
  Problem: Education: Goal: Knowledge of General Education information will improve Description Including pain rating scale, medication(s)/side effects and non-pharmacologic comfort measures Outcome: Progressing   Problem: Health Behavior/Discharge Planning: Goal: Ability to manage health-related needs will improve Outcome: Progressing   

## 2022-04-26 NOTE — Plan of Care (Signed)

## 2022-04-29 DIAGNOSIS — R58 Hemorrhage, not elsewhere classified: Secondary | ICD-10-CM | POA: Diagnosis not present

## 2022-05-03 DIAGNOSIS — M79672 Pain in left foot: Secondary | ICD-10-CM | POA: Diagnosis not present

## 2022-05-04 DIAGNOSIS — R0602 Shortness of breath: Secondary | ICD-10-CM | POA: Diagnosis not present

## 2022-05-04 DIAGNOSIS — Z20822 Contact with and (suspected) exposure to covid-19: Secondary | ICD-10-CM | POA: Diagnosis not present

## 2022-05-04 DIAGNOSIS — R509 Fever, unspecified: Secondary | ICD-10-CM | POA: Diagnosis not present

## 2022-05-08 ENCOUNTER — Other Ambulatory Visit: Payer: Self-pay

## 2022-05-08 DIAGNOSIS — S42201A Unspecified fracture of upper end of right humerus, initial encounter for closed fracture: Secondary | ICD-10-CM | POA: Diagnosis not present

## 2022-05-08 MED ORDER — POTASSIUM CHLORIDE ER 10 MEQ PO TBCR: 20.0000 meq | EXTENDED_RELEASE_TABLET | Freq: Every day | ORAL | 3 refills | Status: DC

## 2022-05-08 NOTE — Telephone Encounter (Signed)
Pt's medication was sent to pt's pharmacy as requested. Confirmation received.  °

## 2022-05-14 DIAGNOSIS — S92355D Nondisplaced fracture of fifth metatarsal bone, left foot, subsequent encounter for fracture with routine healing: Secondary | ICD-10-CM | POA: Diagnosis not present

## 2022-05-17 DIAGNOSIS — D509 Iron deficiency anemia, unspecified: Secondary | ICD-10-CM | POA: Diagnosis not present

## 2022-05-17 DIAGNOSIS — F419 Anxiety disorder, unspecified: Secondary | ICD-10-CM | POA: Diagnosis not present

## 2022-05-17 DIAGNOSIS — I480 Paroxysmal atrial fibrillation: Secondary | ICD-10-CM | POA: Diagnosis not present

## 2022-05-17 DIAGNOSIS — E785 Hyperlipidemia, unspecified: Secondary | ICD-10-CM | POA: Diagnosis not present

## 2022-05-17 DIAGNOSIS — Z720 Tobacco use: Secondary | ICD-10-CM | POA: Diagnosis not present

## 2022-05-17 DIAGNOSIS — M8580 Other specified disorders of bone density and structure, unspecified site: Secondary | ICD-10-CM | POA: Diagnosis not present

## 2022-05-17 DIAGNOSIS — E0590 Thyrotoxicosis, unspecified without thyrotoxic crisis or storm: Secondary | ICD-10-CM | POA: Diagnosis not present

## 2022-05-17 DIAGNOSIS — F331 Major depressive disorder, recurrent, moderate: Secondary | ICD-10-CM | POA: Diagnosis not present

## 2022-05-18 DIAGNOSIS — R0602 Shortness of breath: Secondary | ICD-10-CM | POA: Diagnosis not present

## 2022-05-18 DIAGNOSIS — Z20822 Contact with and (suspected) exposure to covid-19: Secondary | ICD-10-CM | POA: Diagnosis not present

## 2022-05-18 DIAGNOSIS — R509 Fever, unspecified: Secondary | ICD-10-CM | POA: Diagnosis not present

## 2022-05-20 DIAGNOSIS — Z20822 Contact with and (suspected) exposure to covid-19: Secondary | ICD-10-CM | POA: Diagnosis not present

## 2022-05-20 DIAGNOSIS — R0602 Shortness of breath: Secondary | ICD-10-CM | POA: Diagnosis not present

## 2022-05-20 DIAGNOSIS — R509 Fever, unspecified: Secondary | ICD-10-CM | POA: Diagnosis not present

## 2022-05-22 DIAGNOSIS — S42201A Unspecified fracture of upper end of right humerus, initial encounter for closed fracture: Secondary | ICD-10-CM | POA: Diagnosis not present

## 2022-06-04 DIAGNOSIS — S92355D Nondisplaced fracture of fifth metatarsal bone, left foot, subsequent encounter for fracture with routine healing: Secondary | ICD-10-CM | POA: Diagnosis not present

## 2022-06-06 ENCOUNTER — Telehealth: Payer: Self-pay | Admitting: Internal Medicine

## 2022-06-06 MED ORDER — PREDNISONE 20 MG PO TABS: 20.0000 mg | ORAL_TABLET | Freq: Every day | ORAL | 0 refills | Status: DC

## 2022-06-06 NOTE — Telephone Encounter (Signed)
Called patient but she did not answer. Left message for patient to call back.  

## 2022-06-06 NOTE — Telephone Encounter (Signed)
Begin prednisone 20 mg daily for the next 5 days, take with food Continue on Symbicort twice daily.  Xopenex inhaler as needed If not improving will need office visit for further evaluation and treatment options Please contact office for sooner follow up if symptoms do not improve or worsen or seek emergency care

## 2022-06-06 NOTE — Telephone Encounter (Signed)
Sending to Tammy instead of Dr. Verlee Monte since Lynelle Smoke is provider of the day this morning 4/4. Please advise.

## 2022-06-06 NOTE — Telephone Encounter (Signed)
Pt called the office stating that she has been having a little more trouble with her breathing and also has had congestion in her chest. Pt is not sure if this is due to the allergy season or not.  Pt also states she has some wheezing and some coughing getting up phlegm that is yellow in color.  Denies any complaints of fever.   Pt has had to use her rescue inhaler a lot over the past 2 days stating probably around 6-7 times.  Pt is still using her Symbicort inhaler twice a day as prescribed.   Due to her symptoms, she is requesting to have prednisone sent in to see if this would help with her symptoms.   Routing to Dr. Verlee Monte with Dr. Melvyn Novas not being available today. Please advise.

## 2022-06-06 NOTE — Telephone Encounter (Signed)
Pt returned call. Rx for prednisone has been sent to preferred pharmacy for pt. Nothing further needed.

## 2022-06-11 DIAGNOSIS — S42201D Unspecified fracture of upper end of right humerus, subsequent encounter for fracture with routine healing: Secondary | ICD-10-CM | POA: Diagnosis not present

## 2022-06-19 DIAGNOSIS — S42201D Unspecified fracture of upper end of right humerus, subsequent encounter for fracture with routine healing: Secondary | ICD-10-CM | POA: Diagnosis not present

## 2022-06-21 DIAGNOSIS — S42201D Unspecified fracture of upper end of right humerus, subsequent encounter for fracture with routine healing: Secondary | ICD-10-CM | POA: Diagnosis not present

## 2022-06-25 DIAGNOSIS — M25511 Pain in right shoulder: Secondary | ICD-10-CM | POA: Diagnosis not present

## 2022-06-26 DIAGNOSIS — S42201D Unspecified fracture of upper end of right humerus, subsequent encounter for fracture with routine healing: Secondary | ICD-10-CM | POA: Diagnosis not present

## 2022-06-28 ENCOUNTER — Encounter: Payer: Self-pay | Admitting: Nurse Practitioner

## 2022-06-28 ENCOUNTER — Ambulatory Visit: Payer: Medicare PPO | Admitting: Nurse Practitioner

## 2022-06-28 ENCOUNTER — Ambulatory Visit (INDEPENDENT_AMBULATORY_CARE_PROVIDER_SITE_OTHER): Payer: Medicare PPO

## 2022-06-28 VITALS — BP 122/70 | HR 75 | Temp 97.7°F | Ht 65.0 in | Wt 150.6 lb

## 2022-06-28 DIAGNOSIS — J4541 Moderate persistent asthma with (acute) exacerbation: Secondary | ICD-10-CM

## 2022-06-28 DIAGNOSIS — J441 Chronic obstructive pulmonary disease with (acute) exacerbation: Secondary | ICD-10-CM | POA: Diagnosis not present

## 2022-06-28 DIAGNOSIS — R059 Cough, unspecified: Secondary | ICD-10-CM | POA: Diagnosis not present

## 2022-06-28 DIAGNOSIS — F17210 Nicotine dependence, cigarettes, uncomplicated: Secondary | ICD-10-CM

## 2022-06-28 DIAGNOSIS — G4710 Hypersomnia, unspecified: Secondary | ICD-10-CM | POA: Insufficient documentation

## 2022-06-28 DIAGNOSIS — J45909 Unspecified asthma, uncomplicated: Secondary | ICD-10-CM | POA: Diagnosis not present

## 2022-06-28 DIAGNOSIS — G4700 Insomnia, unspecified: Secondary | ICD-10-CM | POA: Diagnosis not present

## 2022-06-28 DIAGNOSIS — J45901 Unspecified asthma with (acute) exacerbation: Secondary | ICD-10-CM

## 2022-06-28 MED ORDER — PREDNISONE 10 MG PO TABS: ORAL_TABLET | ORAL | 0 refills | Status: DC

## 2022-06-28 MED ORDER — DOXYCYCLINE HYCLATE 100 MG PO TABS: 100.0000 mg | ORAL_TABLET | Freq: Two times a day (BID) | ORAL | 0 refills | Status: AC

## 2022-06-28 NOTE — Assessment & Plan Note (Signed)
It does not look like she has been enrolled in the lung cancer screening program. Referral resent today and message sent.

## 2022-06-28 NOTE — Assessment & Plan Note (Signed)
Appears to be an ongoing problem for her, made worse with anxiety and current exacerbation.  May consider home sleep study to rule out sleep apnea once she returns and acute symptoms have improved.  Cautioned on safe driving practices.  Advised her to follow-up with her PCP to better manage her anxiety.

## 2022-06-28 NOTE — Assessment & Plan Note (Addendum)
Difficulties with sleep latency and maintenance. Trazodone associated with negative side effects. She has used melatonin in the past with some success. She is going to retry this. Question correlation to untreated OSA? See above plan.

## 2022-06-28 NOTE — Patient Instructions (Addendum)
Continue levalbuterol inhaler 2 puffs every 6 hours as needed for shortness of breath or wheezing. Notify if symptoms persist despite rescue inhaler/neb use.  Continue Symbicort 2 puffs Twice daily. Brush tongue and rinse mouth afterwards Continue flonase 2 sprays each nostril daily Continue singulair 1 tab At bedtime   Prednisone taper. Take two 20 mg tablets (40 mg) for the next 2 days then you will use the taper I have sent in today and take 3 tabs (30 mg) for 2 days then 2 tabs (20 mg) for 2 days then 1 tab (10 mg) for 2 days. Take in AM with food.  Doxycycline 1 tab Twice daily for 7 days. Take with food. Wear sunscreen when outside   We will discuss doing a home sleep study once you come back next time  Follow up in 2 weeks with Dr. Sherene Sires or Philis Nettle. If symptoms do not improve or worsen, please contact office for sooner follow up or seek emergency care.

## 2022-06-28 NOTE — Assessment & Plan Note (Signed)
AECOPD/asthma with bronchitis.  CXR today without superimposed infection.  We will treat her with empiric course of doxycycline x 10 days and prednisone taper.  She will continue ICS/LABA therapy.  May consider adding on LAMA.  Action plan in place.  Patient Instructions  Continue levalbuterol inhaler 2 puffs every 6 hours as needed for shortness of breath or wheezing. Notify if symptoms persist despite rescue inhaler/neb use.  Continue Symbicort 2 puffs Twice daily. Brush tongue and rinse mouth afterwards Continue flonase 2 sprays each nostril daily Continue singulair 1 tab At bedtime   Prednisone taper. Take two 20 mg tablets (40 mg) for the next 2 days then you will use the taper I have sent in today and take 3 tabs (30 mg) for 2 days then 2 tabs (20 mg) for 2 days then 1 tab (10 mg) for 2 days. Take in AM with food.  Doxycycline 1 tab Twice daily for 7 days. Take with food. Wear sunscreen when outside   We will discuss doing a home sleep study once you come back next time  Follow up in 2 weeks with Dr. Sherene Sires or Philis Nettle. If symptoms do not improve or worsen, please contact office for sooner follow up or seek emergency care.
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
## 2022-07-02 DIAGNOSIS — N1831 Chronic kidney disease, stage 3a: Secondary | ICD-10-CM | POA: Diagnosis not present

## 2022-07-02 DIAGNOSIS — E785 Hyperlipidemia, unspecified: Secondary | ICD-10-CM | POA: Diagnosis not present

## 2022-07-02 DIAGNOSIS — I700 Atherosclerosis of aorta: Secondary | ICD-10-CM | POA: Diagnosis not present

## 2022-07-02 DIAGNOSIS — R7989 Other specified abnormal findings of blood chemistry: Secondary | ICD-10-CM | POA: Diagnosis not present

## 2022-07-02 DIAGNOSIS — E0590 Thyrotoxicosis, unspecified without thyrotoxic crisis or storm: Secondary | ICD-10-CM | POA: Diagnosis not present

## 2022-07-08 ENCOUNTER — Other Ambulatory Visit (HOSPITAL_COMMUNITY): Payer: Self-pay

## 2022-07-08 ENCOUNTER — Telehealth: Payer: Self-pay | Admitting: Internal Medicine

## 2022-07-08 NOTE — Telephone Encounter (Signed)
Spoke with patient. She complains of productive cough with milky/yellow phelgm, SOB, low energy. Symptoms present since Friday night  Patient doesn't think Symbicort is helping-would like a different inhaler if possible Patient has been giving prednisone previously and stated this really helped while on it. Patient would like prednisone or antibiotic if possible  Pharmacy [facility]  Dr. Sherene Sires please advise?

## 2022-07-08 NOTE — Telephone Encounter (Signed)
Called and spoke with pt about the info per Dr. Sherene Sires. Pt said that she had mentioned to Dr. Sherene Sires at last visit that she was on Symbicort and not on Breztri and pt does not want to go on [place].   Pt also stated that she is not able to take zpak as she said she is allergic to it. Pt said she can take doxycycline and is wanting to have that prescribed instead. Dr. Sherene Sires, please advise.

## 2022-07-08 NOTE — Telephone Encounter (Signed)
Patient states having symptoms of cough and shortness of breath., Pharmacy is [facility]. Patient phone number is [phone].

## 2022-07-08 NOTE — Telephone Encounter (Signed)
My notes say she was supposed to be breztri which is symbicort plus extra medication  - if back on symbiocrt and not happy with it, needs to be on breztri 2bid if possible   Ok to do zpak and Prednisone 10 mg take  4 each am x 2 days,   2 each am x 2 days,  1 each am x 2 days and stop if no one can see her today in [place] office but set her up for next available for this flare f/u as we need to regroup for long term asthma control purposes

## 2022-07-08 NOTE — Telephone Encounter (Signed)
Doxy 100  mg bid x 10 days/ stay out of sun  Sorry for the error on the breztri - I didn't read the list correctly this am - symbicort  is entered as her maintenance rx and she should continue it as is for now   Will still need to set up an in person ov with all meds in hand either with me or NP to get a better plan going forward.

## 2022-07-09 DIAGNOSIS — J4520 Mild intermittent asthma, uncomplicated: Secondary | ICD-10-CM | POA: Diagnosis not present

## 2022-07-09 DIAGNOSIS — I6523 Occlusion and stenosis of bilateral carotid arteries: Secondary | ICD-10-CM | POA: Diagnosis not present

## 2022-07-09 DIAGNOSIS — I700 Atherosclerosis of aorta: Secondary | ICD-10-CM | POA: Diagnosis not present

## 2022-07-09 DIAGNOSIS — I480 Paroxysmal atrial fibrillation: Secondary | ICD-10-CM | POA: Diagnosis not present

## 2022-07-09 DIAGNOSIS — N1831 Chronic kidney disease, stage 3a: Secondary | ICD-10-CM | POA: Diagnosis not present

## 2022-07-09 DIAGNOSIS — E0590 Thyrotoxicosis, unspecified without thyrotoxic crisis or storm: Secondary | ICD-10-CM | POA: Diagnosis not present

## 2022-07-09 DIAGNOSIS — R82998 Other abnormal findings in urine: Secondary | ICD-10-CM | POA: Diagnosis not present

## 2022-07-09 DIAGNOSIS — F419 Anxiety disorder, unspecified: Secondary | ICD-10-CM | POA: Diagnosis not present

## 2022-07-09 DIAGNOSIS — Z0000 Encounter for general adult medical examination without abnormal findings: Secondary | ICD-10-CM | POA: Diagnosis not present

## 2022-07-09 MED ORDER — DOXYCYCLINE HYCLATE 100 MG PO TABS: 100.0000 mg | ORAL_TABLET | Freq: Two times a day (BID) | ORAL | 0 refills | Status: DC

## 2022-07-09 NOTE — Telephone Encounter (Signed)
Called [name], Pt states Symbicort is not helping, I have sent in doxy. Pt says she is experiencing cough, wheezing, sob shaking, chills,and no fever. She has an appointment with you Monday. Pt wants to know if she can have prednisone.   Dr. Sherene Sires please advise

## 2022-07-09 NOTE — Telephone Encounter (Signed)
Patient checking on message sent yesterday, Patient phone number is [phone].

## 2022-07-09 NOTE — Telephone Encounter (Signed)
Called and spoke with pt letting her know the recs per Dr. Sherene Sires that strongly recommended her going to the ED to make sure she didn't have a serious infection due to her symptoms and she verbalized understanding. Pt said she had an appt with PCP today that she was planning on keeping to see if they could tell her anything different than what we have told her and she said if she did not get any answers from them that would be beneficial that she would then go to the ED for evaluation. Nothing further needed.

## 2022-07-09 NOTE — Telephone Encounter (Signed)
I strongly rec she go to ER if having chills as that does not fit with a typical copd exacerbation and may suggest a serious infection that doxy won't be adequate for.  If she refuses we can call her in Prednisone 10 mg take  4 each am x 2 days,   2 each am x 2 days,  1 each am x 2 days and stop

## 2022-07-10 DIAGNOSIS — S42201D Unspecified fracture of upper end of right humerus, subsequent encounter for fracture with routine healing: Secondary | ICD-10-CM | POA: Diagnosis not present

## 2022-07-13 NOTE — Progress Notes (Unsigned)
Subjective:   Patient ID: Elizabeth Mcconnell, female    DOB: Aug 02, 1945    MRN: [id number]  Brief patient profile:  9  yowf active smoker with "asthma all her life" previous pt of Dr Delford Field and Jamison Neighbor with moderate asthma criteria vs copd GOLD II as of pfts 07/01/2017       History of Present Illness  03/25/2016 acute extended ov/Kaius Daino re:  Chronic asthma maint flovent hfa ? Still smoking ?  Chief Complaint  Patient presents with   Acute Visit    Pt c/o chest and sinus congestion for the past 2 wks. She feels very tired and states that she has chills "all the time"- no fever. She has had a prod cough with grey sputum.    baseline doing fine on  flovent hfa / singulair not needing any rescue with little flare of cough attributes to  [facility] Peers dec 2017 took doxy and all better until 2 weeks prior to OV with cough thick grey mucus x 2 weeks worse in ams with lots of sinus congestion and grey mucus, some chills but no fever / sweats - even with flare not using xopenex and not sure how much she can "maybe 2 pffs a day"  rec Doxycycline 100 mg twice daily x 10 days Prednisone 10 mg take  4 each am x 2 days,   2 each am x 2 days,  1 each am x 2 days and stop  Work on inhaler technique: For cough / congestion mucinex 1200 mg every 12 hours as needed and use the xopenex hfa more regularly to improve the mucus flow     09/24/2021  f/u ov/Krishon Adkison re: AB  maint on breztri/ quit smoking x 2 days prior to OV   Chief Complaint  Patient presents with   Follow-up    Pt states she is about the same since LOV. Pt trying to get use to the [facility] inhaler.   Rec Plan A = Automatic = Always=    Breztri Take 2 puffs first thing in am and then another 2 puffs about 12 hours later.  Work [employer] on inhaler technique:  Plan B = Backup (to supplement plan A, not to replace it) Only use your albuterol inhaler as a rescue medication   Ok to try albuterol 15 min before an activity (on alternating days)  that you know  would usually make you short of breath  If still having night time wheeze try adding Pepcid 20 mg after supper or bedtime until return.  Add: LDSCT rec> not done as of 12/31/2021    12/31/2021  f/u ov/Derrien Anschutz re: copd gold 2/ACOS   maint on breztri 2bid worse x one month/ still smoking  No chief complaint on file. Dyspnea: MMRC2 = can't walk a nl pace on a flat grade s sob but does fine slow and flat  Cough: slt yellow esp in am x 1 m prior to OV  Pos COVID > malnupivir  Sleeping: no resp cc  SABA use: rarely at hs  02: none  Covid status:   vax max x for the new variant  Lung cancer screening :  advised to do it.  Rec Doxycycline 100 mg twice daily x 7 days with large glass of water before eating Prednisone 10 mg take  4 each am x 2 days,   2 each am x 2 days,  1 each am x 2 days and stop  The key is to stop smoking completely before  smoking completely stops you! My office will be contacting you by phone for referral for lung cancer screening  > not done as of 07/15/2022   NP eval cough  06/28/22 Continue levalbuterol inhaler 2 puffs every 6 hours as needed for shortness of breath or wheezing. Notify if symptoms persist despite rescue inhaler/neb use.  Continue Symbicort 2 puffs Twice daily. Brush tongue and rinse mouth afterwards Continue flonase 2 sprays each nostril daily Continue singulair 1 tab At bedtime  Prednisone taper> helped while on it.   07/09/22  PCP Rx Doxy  Depomedrol   07/15/2022  f/u ov/Tehila Sokolow re: GOLD 2 copd/AB still smoking maint on symbicort 160 ? Lung ca screening*** Chief Complaint  Patient presents with   Follow-up    Dyspnea and cough since 06/28/2022.  Taken prednisone, doxycycline.  Sx improved until patient finished meds.  Saw PCP Dr. Evlyn Kanner last week.  Continued Doxycycline and depo medrol injection.  Dyspnea:  MMRC2 = can't walk a nl pace on a flat grade s sob but does fine slow and flat eg Cough: cough/wheezing no color now  Sleeping: flat bed no resp cc   SABA use: too much  02: none      No obvious day to day or daytime variability or assoc excess/ purulent sputum or mucus plugs or hemoptysis or cp or chest tightness, subjective wheeze or overt sinus or hb symptoms.   *** without nocturnal  or early am exacerbation  of respiratory  c/o's or need for noct saba. Also denies any obvious fluctuation of symptoms with weather or environmental changes or other aggravating or alleviating factors except as outlined above   No unusual exposure hx or h/o childhood pna or knowledge of premature birth.  Current Allergies, Complete Past Medical History, Past Surgical History, Family History, and Social History were reviewed in [employer] record.  ROS  The following are not active complaints unless bolded Hoarseness, sore throat, dysphagia, dental problems, itching, sneezing,  nasal congestion or discharge of excess mucus or purulent secretions, ear ache,   fever, chills, sweats, unintended wt loss or wt gain, classically pleuritic or exertional cp,  orthopnea pnd or arm/hand swelling  or leg swelling, presyncope, palpitations, abdominal pain, anorexia, nausea, vomiting, diarrhea  or change in bowel habits or change in bladder habits, change in stools or change in urine, dysuria, hematuria,  rash, arthralgias, visual complaints, headache, numbness, weakness or ataxia or problems with walking or coordination,  change in mood or  memory.        Current Meds  Medication Sig   acetaminophen (TYLENOL) 325 MG tablet Take 2 tablets (650 mg total) by mouth every 6 (six) hours as needed for up to 30 doses for moderate pain or mild pain.   ALPRAZolam (XANAX) 0.25 MG tablet Take 1 tablet (0.25 mg total) by mouth at bedtime as needed for anxiety. TAKE 1 TABLET BY MOUTH AT BEDTIME AS NEEDED FOR ANXIETY OR SLEEP Strength: 0.25 mg   amLODipine (NORVASC) 2.5 MG tablet TAKE 1 TABLET BY MOUTH EVERY DAY (Patient taking differently: Take 2.5 mg by mouth  daily.)   budesonide-formoterol (SYMBICORT) 160-4.5 MCG/ACT inhaler INHALE 2 PUFFS FIRST THING in THE MORNING, THEN INHALE 2 PUFFS 12 HOURS LATER (Patient taking differently: Inhale 2 puffs into the lungs. INHALE 2 PUFFS FIRST THING in THE MORNING, THEN INHALE 2 PUFFS 12 HOURS LATER)   busPIRone (BUSPAR) 10 MG tablet Take 10 mg by mouth daily.   BYSTOLIC 5 MG tablet TAKE  1 TABLET BY MOUTH EVERY DAY (Patient taking differently: Take 5 mg by mouth daily.)   Calcium Carb-Cholecalciferol 600-800 MG-UNIT TABS Take 1 tablet by mouth daily.   dofetilide (TIKOSYN) 250 MCG capsule TAKE 1 CAPSULE BY MOUTH 2 TIMES DAILY (Patient taking differently: Take 250 mcg by mouth 2 (two) times daily.)   doxycycline (VIBRA-TABS) 100 MG tablet Take 1 tablet (100 mg total) by mouth 2 (two) times daily.   ELIQUIS 5 MG TABS tablet TAKE 1 TABLET BY MOUTH 2 TIMES DAILY (Patient taking differently: Take 5 mg by mouth 2 (two) times daily.)   ferrous sulfate (SLOW IRON) 160 (50 Fe) MG TBCR SR tablet Take by mouth daily.   fluticasone (FLONASE) 50 MCG/ACT nasal spray USE 2 SPRAYS IN EACH NOSTRIL 2 TIMES DAILY (Patient taking differently: Place 2 sprays into both nostrils 2 (two) times daily.)   levalbuterol (XOPENEX HFA) 45 MCG/ACT inhaler inhale 1-2 PUFFS INTO THE LUNGS EVERY 4 HOURS AS NEEDED (Patient taking differently: Inhale 1-2 puffs into the lungs every 4 (four) hours as needed for wheezing or shortness of breath.)   losartan (COZAAR) 100 MG tablet TAKE 1 TABLET BY MOUTH EVERY DAY (Patient taking differently: Take 100 mg by mouth daily.)   magnesium oxide (MAG-OX) 400 MG tablet Take 1 tablet (400 mg total) by mouth daily.   montelukast (SINGULAIR) 10 MG tablet TAKE 1 TABLET BY MOUTH EVERY DAY (Patient taking differently: Take 10 mg by mouth daily.)   potassium chloride (KLOR-CON) 10 MEQ tablet Take 2 tablets (20 mEq total) by mouth daily.   pravastatin (PRAVACHOL) 40 MG tablet Take 40 mg by mouth daily.                     Objective:   Physical Exam  Wts  07/15/2022          *** 12/31/2021       154  09/24/2021         150  08/06/2021           148  01/16/2021       152   07/24/2020         159  10/22/2019       153 09/16/2018        151 07/01/2017        140  02/19/2017      147   03/25/16 156 lb 12.8 oz (71.1 kg)  02/12/16 155 lb 6.4 oz (70.5 kg)  11/28/15 150 lb 12 oz (68.4 kg)     Vital signs reviewed  07/15/2022  - Note at rest 02 sats  ***% on ***   General appearance:    ***     Min bar***   Assessment & Plan:

## 2022-07-15 ENCOUNTER — Encounter: Payer: Self-pay | Admitting: Internal Medicine

## 2022-07-15 ENCOUNTER — Ambulatory Visit: Payer: Medicare PPO | Admitting: Internal Medicine

## 2022-07-15 VITALS — BP 122/76 | HR 70 | Temp 98.5°F | Ht 65.0 in | Wt 148.0 lb

## 2022-07-15 DIAGNOSIS — J4540 Moderate persistent asthma, uncomplicated: Secondary | ICD-10-CM | POA: Diagnosis not present

## 2022-07-15 DIAGNOSIS — R053 Chronic cough: Secondary | ICD-10-CM | POA: Diagnosis not present

## 2022-07-15 DIAGNOSIS — J45901 Unspecified asthma with (acute) exacerbation: Secondary | ICD-10-CM

## 2022-07-15 DIAGNOSIS — J441 Chronic obstructive pulmonary disease with (acute) exacerbation: Secondary | ICD-10-CM | POA: Diagnosis not present

## 2022-07-15 DIAGNOSIS — G4710 Hypersomnia, unspecified: Secondary | ICD-10-CM | POA: Diagnosis not present

## 2022-07-15 DIAGNOSIS — F17210 Nicotine dependence, cigarettes, uncomplicated: Secondary | ICD-10-CM | POA: Diagnosis not present

## 2022-07-15 MED ORDER — SPIRIVA RESPIMAT 2.5 MCG/ACT IN AERS: 2.0000 | INHALATION_SPRAY | Freq: Every day | RESPIRATORY_TRACT | 11 refills | Status: DC

## 2022-07-15 MED ORDER — PREDNISONE 10 MG PO TABS: ORAL_TABLET | ORAL | 0 refills | Status: DC

## 2022-07-15 MED ORDER — SPIRIVA RESPIMAT 2.5 MCG/ACT IN AERS: 2.0000 | INHALATION_SPRAY | Freq: Every day | RESPIRATORY_TRACT | 0 refills | Status: DC

## 2022-07-15 NOTE — Patient Instructions (Addendum)
My office will be contacting you by phone for referral to sinus CT next available  - if you don't hear back from my office within one week please call us back or notify us thru MyChart and we'll address it right away.   Plan A = Automatic = Always=   Symbicort 160 x 2 puffs 1st each am and again 12 hours later Spiriva 2 pffs each aft  Plan B = Backup (to supplement plan A, not to replace it) Only use your albuterol inhaler as a rescue medication to be used if you can't catch your breath by resting or doing a relaxed purse lip breathing pattern.  - The less you use it, the better it will work when you need it. - Ok to use the inhaler up to 2 puffs  every 4 hours if you must but call for appointment if use goes up over your usual need - Don't leave home without it !!  (think of it like the spare tire for your car)   Plan D = Deltasone if AB  not working well  Prednisone 10 mg take  4 each am x 2 days,   2 each am x 2 days,  1 each am x 2 days and stop   For cough > mucinex  600 1-2 every 12 hours as needed with flutter valve   Please schedule a follow up office visit in 4 weeks, sooner if needed  with all medications /inhalers/ solutions in hand so we can verify exactly what you are taking. This includes all medications from all doctors and over the counters

## 2022-07-16 ENCOUNTER — Telehealth: Payer: Self-pay | Admitting: Internal Medicine

## 2022-07-16 ENCOUNTER — Encounter: Payer: Self-pay | Admitting: Internal Medicine

## 2022-07-16 DIAGNOSIS — S92355D Nondisplaced fracture of fifth metatarsal bone, left foot, subsequent encounter for fracture with routine healing: Secondary | ICD-10-CM | POA: Diagnosis not present

## 2022-07-16 NOTE — Assessment & Plan Note (Addendum)
Onset early April 2024 partially responsive to doxy / prednisone  - 07/15/2022 added flutter valve/ gerd rx   >>> Sinus CT ordered 07/15/2022 due to failure of multile courses of abx to resolve the nasal symptoms          Each maintenance medication was reviewed in detail including emphasizing most importantly the difference between maintenance and prns and under what circumstances the prns are to be triggered using an action plan format where appropriate.  Total time for H and P, chart review, counseling, reviewing flutter  device(s) and generating customized AVS unique to this office visit / same day charting  > 30 min

## 2022-07-16 NOTE — Telephone Encounter (Signed)
Patient would like the nurse to call her to see if she can be worked in earlier than 7/8 since she said the doctor wanted to see her in 4 weeks.  Please advise

## 2022-07-16 NOTE — Telephone Encounter (Signed)
Patient has been scheduled for f/u in July- 1st available. Patient is worried this will be too long. There are sooner apts in June for [employer] but patient is unable to travel that far. Dr. Sherene Sires has openings on May 31st in [place]. Patient wants to know if she can f/u then or she okay to wait for July.   Dr. Sherene Sires please advise?

## 2022-07-16 NOTE — Telephone Encounter (Signed)
Let her know p review of records I see she's not taking anything for gerd which is almost always assoc with chronic cough that can imitate/ aggravate AB and possible sinus dz so rec  Try prilosec otc 20mg   Take 30-60 min before first meal of the day and Pepcid ac (famotidine) 20 mg one @  bedtime until cough is completely gone for at least a week without the need for cough suppression

## 2022-07-16 NOTE — Telephone Encounter (Signed)
May 31 is fine

## 2022-07-16 NOTE — Assessment & Plan Note (Addendum)
Onset "all her life"/ active smoker  Spirometry  09/25/15 FEV1 1.58  (70%)  Ratio 55 on flovent 110 2bid  PFT  09/25/15:  FEV1 1.58 L (70%) FEV1/FVC 0.55   73% DLCO uncorrected 58% - PFT's  07/01/2017  FEV1 1.71 (77 % ) ratio 62  p 6 % improvement from saba p only flovent 110 prior to study with DLCO  60 % corrects to 72  % for alv volume  - 06/01/20  Change to trelegy  > preferred symbicort 160 - .08/06/2021 change to breztri 2 in am and prn the pm doses due to mouth irritation -.09/24/2021  continue breztri   - 12/31/2021  After extensive coaching inhaler device,  effectiveness =    85% > continue Breztri > preferred symb 160 - 07/15/2022  After extensive coaching inhaler device,  effectiveness =    75% SMI try adding spiriva and added prednisone x 6 d  as "plan C" on action plan    Group D (now reclassified as E) in terms of symptom/risk and laba/lama/ICS  therefore appropriate rx at this point >>>  symbicort and spiriva with prednisone x 6 d prn if this fails plus add flutter valve, eval for ? Occult sinus dz driving the cough (see sep a/p)

## 2022-07-16 NOTE — Telephone Encounter (Signed)
Spoke with patient. I have her re-schedule for f/u in May. NFN

## 2022-07-16 NOTE — Assessment & Plan Note (Signed)
4-5 min discussion re active cigarette smoking in addition to office E&M  Ask about tobacco use:   ongoing Advise quitting   I took an extended  opportunity with this patient to outline the consequences of continued cigarette use  in airway disorders based on all the data we have  and cessation may result in rapid improvement in some of her airway symptoms, but especially the cough and congestion.  Assess willingness:  Not committed at this point Assist in quit attempt:  Per PCP when ready Arrange follow up:   Follow up per Primary Care planned  For smoking cessation classes call [phone]

## 2022-07-17 DIAGNOSIS — S42201D Unspecified fracture of upper end of right humerus, subsequent encounter for fracture with routine healing: Secondary | ICD-10-CM | POA: Diagnosis not present

## 2022-07-19 ENCOUNTER — Other Ambulatory Visit: Payer: Self-pay | Admitting: Internal Medicine

## 2022-07-24 DIAGNOSIS — S42201D Unspecified fracture of upper end of right humerus, subsequent encounter for fracture with routine healing: Secondary | ICD-10-CM | POA: Diagnosis not present

## 2022-07-25 ENCOUNTER — Ambulatory Visit (HOSPITAL_BASED_OUTPATIENT_CLINIC_OR_DEPARTMENT_OTHER)
Admission: RE | Admit: 2022-07-25 | Discharge: 2022-07-25 | Disposition: A | Discharge status: Home / Self Care | Payer: Medicare PPO | Source: Ambulatory Visit | Attending: Internal Medicine | Admitting: Internal Medicine

## 2022-07-25 DIAGNOSIS — R053 Chronic cough: Secondary | ICD-10-CM | POA: Diagnosis not present

## 2022-07-25 DIAGNOSIS — J329 Chronic sinusitis, unspecified: Secondary | ICD-10-CM | POA: Diagnosis not present

## 2022-07-31 ENCOUNTER — Other Ambulatory Visit: Payer: Self-pay

## 2022-07-31 ENCOUNTER — Other Ambulatory Visit: Payer: Self-pay | Admitting: Internal Medicine

## 2022-07-31 DIAGNOSIS — I480 Paroxysmal atrial fibrillation: Secondary | ICD-10-CM

## 2022-07-31 MED ORDER — APIXABAN 5 MG PO TABS: 5.0000 mg | ORAL_TABLET | Freq: Two times a day (BID) | ORAL | 1 refills | Status: DC

## 2022-07-31 NOTE — Telephone Encounter (Signed)
Received faxed refill request for Eliquis from [facility] pharmacy.  Pt last saw Dr Elberta Fortis 03/21/22, last labs 04/25/22 Creat 0.66, age 77, weight 67.1kg, based on specified criteria pt is on appropriate dosage of Eliquis 5mg  BID for afib.  Will refill rx.

## 2022-07-31 NOTE — Addendum Note (Signed)
Addended by: Satira Sark on: 07/31/2022 10:08 AM   Modules accepted: Orders

## 2022-08-01 ENCOUNTER — Telehealth: Payer: Self-pay | Admitting: Internal Medicine

## 2022-08-01 DIAGNOSIS — S42201D Unspecified fracture of upper end of right humerus, subsequent encounter for fracture with routine healing: Secondary | ICD-10-CM | POA: Diagnosis not present

## 2022-08-01 NOTE — Progress Notes (Signed)
Called the pt and there was no answer- LMTCB    

## 2022-08-01 NOTE — Telephone Encounter (Signed)
Pt calling back for CT results 

## 2022-08-02 ENCOUNTER — Encounter: Payer: Self-pay | Admitting: Internal Medicine

## 2022-08-02 ENCOUNTER — Ambulatory Visit: Payer: Medicare PPO | Admitting: Internal Medicine

## 2022-08-02 VITALS — BP 108/62 | HR 69 | Temp 98.2°F | Ht 65.0 in | Wt 150.8 lb

## 2022-08-02 DIAGNOSIS — J4540 Moderate persistent asthma, uncomplicated: Secondary | ICD-10-CM | POA: Diagnosis not present

## 2022-08-02 DIAGNOSIS — F17210 Nicotine dependence, cigarettes, uncomplicated: Secondary | ICD-10-CM | POA: Diagnosis not present

## 2022-08-02 MED ORDER — CLOTRIMAZOLE 10 MG MT TROC: 10.0000 mg | Freq: Every day | OROMUCOSAL | 2 refills | Status: DC

## 2022-08-02 MED ORDER — PREDNISONE 10 MG PO TABS: ORAL_TABLET | ORAL | 6 refills | Status: DC

## 2022-08-02 NOTE — Progress Notes (Unsigned)
Subjective:   Patient ID: Elizabeth Mcconnell, female    DOB: Jul 11, 1945    MRN: [id number]  Brief patient profile:  75  yowf active smoker with "asthma all her life" previous pt of Dr Delford Field and Jamison Neighbor with moderate asthma criteria vs copd GOLD II as of pfts 07/01/2017       History of Present Illness  03/25/2016 acute extended ov/Elizabeth Mcconnell re:  Chronic asthma maint flovent hfa ? Still smoking ?  Chief Complaint  Patient presents with   Acute Visit    Pt c/o chest and sinus congestion for the past 2 wks. She feels very tired and states that she has chills "all the time"- no fever. She has had a prod cough with grey sputum.    baseline doing fine on  flovent hfa / singulair not needing any rescue with little flare of cough attributes to  [place] [facility] dec 2017 took doxy and all better until 2 weeks prior to OV with cough thick grey mucus x 2 weeks worse in ams with lots of sinus congestion and grey mucus, some chills but no fever / sweats - even with flare not using xopenex and not sure how much she can "maybe 2 pffs a day"  rec Doxycycline 100 mg twice daily x 10 days Prednisone 10 mg take  4 each am x 2 days,   2 each am x 2 days,  1 each am x 2 days and stop  Work on inhaler technique: For cough / congestion mucinex 1200 mg every 12 hours as needed and use the xopenex hfa more regularly to improve the mucus flow     09/24/2021  f/u ov/Elizabeth Mcconnell re: AB  maint on breztri/ quit smoking x 2 days prior to OV   Chief Complaint  Patient presents with   Follow-up    Pt states she is about the same since LOV. Pt trying to get use to the [facility] inhaler.   Rec Plan A = Automatic = Always=    Breztri Take 2 puffs first thing in am and then another 2 puffs about 12 hours later.  Work [employer] on inhaler technique:  Plan B = Backup (to supplement plan A, not to replace it) Only use your albuterol inhaler as a rescue medication   Ok to try albuterol 15 min before an activity (on alternating days)  that you know  would usually make you short of breath  If still having night time wheeze try adding Pepcid 20 mg after supper or bedtime until return.  Add: LDSCT rec> not done as of 12/31/2021    12/31/2021  f/u ov/Elizabeth Mcconnell re: copd gold 2/ACOS   maint on breztri 2bid worse x one month/ still smoking  No chief complaint on file. Dyspnea: MMRC2 = can't walk a nl pace on a flat grade s sob but does fine slow and flat  Cough: slt yellow esp in am x 1 m prior to OV  Pos COVID > malnupivir  Sleeping: no resp cc  SABA use: rarely at hs  02: none  Covid status:   vax max x for the new variant  Lung cancer screening :  advised to do it.  Rec Doxycycline 100 mg twice daily x 7 days with large glass of water before eating Prednisone 10 mg take  4 each am x 2 days,   2 each am x 2 days,  1 each am x 2 days and stop  The key is to stop smoking completely before  smoking completely stops you! My office will be contacting you by phone for referral for lung cancer screening  > not done as of 07/15/2022   NP eval cough  06/28/22 Continue levalbuterol inhaler 2 puffs every 6 hours as needed for shortness of breath or wheezing. Notify if symptoms persist despite rescue inhaler/neb use.  Continue Symbicort 2 puffs Twice daily. Brush tongue and rinse mouth afterwards Continue flonase 2 sprays each nostril daily Continue singulair 1 tab At bedtime  Prednisone taper> helped while on it.   07/09/22  PCP Rx Doxy  Depomedrol   07/15/2022  f/u ov/Elizabeth Mcconnell re: GOLD 2 copd/AB still smoking maint on symbicort 160   Chief Complaint  Patient presents with   Follow-up    Dyspnea and cough since 06/28/2022.  Taken prednisone, doxycycline.  Sx improved until patient finished meds.  Saw PCP Dr. Evlyn Kanner last week.  Continued Doxycycline and depo medrol injection.  Dyspnea:  MMRC2 = can't walk a nl pace on a flat grade s sob but does fine slow and flat Cough: cough/wheezing no color now  Sleeping: flat bed no resp cc  SABA use: too much  02:  none  Rec  Plan A = Automatic = Always=   Symbicort 160 x 2 puffs 1st each am and again 12 hours later Spiriva 2 pffs each  am  Plan B = Backup (to supplement plan A, not to replace it) Only use your albuterol inhaler as a rescue medication Plan D = Deltasone if AB  not working well  Prednisone 10 mg take  4 each am x 2 days,   2 each am x 2 days,  1 each am x 2 days and stop  For cough > mucinex  600 1-2 every 12 hours as needed with flutter valve  Please schedule a follow up office visit in 4 weeks, sooner if needed  with all medications /inhalers/ solutions in hand  Late add: Try prilosec otc 20mg   Take 30-60 min before first meal of the day and Pepcid ac (famotidine) 20 mg one @  bedtime until cough is completely gone for at least a week without the need for cough suppression  07/24/21  CT sinus R sphenoid dz   08/02/2022  f/u ov/Elizabeth Mcconnell re: GOLD 2/AB  maint on symb/spiriva  did not  bring meds  Chief Complaint  Patient presents with   Follow-up    Review medications.  Did take prednisone - helped sx.  Productive cough and dyspnea persistent.  Dyspnea:  up and down steps better  Cough: better / min mucoid  Sleeping: flat bed/ 1-2 pillows s resp cc  SABA use: 1-2 x per day  02: none  New rash on 5/28 both legs/ does not itch    No obvious day to day or daytime variability or assoc excess/ purulent sputum or mucus plugs or hemoptysis or cp or chest tightness, subjective wheeze or overt sinus or hb symptoms.   Sleeping  without nocturnal  or early am exacerbation  of respiratory  c/o's or need for noct saba. Also denies any obvious fluctuation of symptoms with weather or environmental changes or other aggravating or alleviating factors except as outlined above   No unusual exposure hx or h/o childhood pna/ asthma or knowledge of premature birth.  Current Allergies, Complete Past Medical History, Past Surgical History, Family History, and Social History were reviewed in [employer] record.  ROS  The following are not active complaints unless bolded Hoarseness,  sore throat, dysphagia, dental problems, itching, sneezing,  nasal congestion or discharge of excess mucus or purulent secretions, ear ache,   fever, chills, sweats, unintended wt loss or wt gain, classically pleuritic or exertional cp,  orthopnea pnd or arm/hand swelling  or leg swelling, presyncope, palpitations, abdominal pain, anorexia, nausea, vomiting, diarrhea  or change in bowel habits or change in bladder habits, change in stools or change in urine, dysuria, hematuria,  rash, arthralgias, visual complaints, headache, numbness, weakness or ataxia or problems with walking or coordination,  change in mood or  memory.        Current Meds  Medication Sig   acetaminophen (TYLENOL) 325 MG tablet Take 2 tablets (650 mg total) by mouth every 6 (six) hours as needed for up to 30 doses for moderate pain or mild pain.   ALPRAZolam (XANAX) 0.25 MG tablet Take 1 tablet (0.25 mg total) by mouth at bedtime as needed for anxiety. TAKE 1 TABLET BY MOUTH AT BEDTIME AS NEEDED FOR ANXIETY OR SLEEP Strength: 0.25 mg   amLODipine (NORVASC) 2.5 MG tablet TAKE 1 TABLET BY MOUTH EVERY DAY (Patient taking differently: Take 2.5 mg by mouth daily.)   apixaban (ELIQUIS) 5 MG TABS tablet Take 1 tablet (5 mg total) by mouth 2 (two) times daily.   budesonide-formoterol (SYMBICORT) 160-4.5 MCG/ACT inhaler INHALE 2 PUFFS FIRST THING in THE MORNING, THEN INHALE 2 PUFFS 12 HOURS LATER (Patient taking differently: Inhale 2 puffs into the lungs. INHALE 2 PUFFS FIRST THING in THE MORNING, THEN INHALE 2 PUFFS 12 HOURS LATER)   busPIRone (BUSPAR) 10 MG tablet Take 10 mg by mouth daily.   BYSTOLIC 5 MG tablet TAKE 1 TABLET BY MOUTH EVERY DAY (Patient taking differently: Take 5 mg by mouth daily.)   Calcium Carb-Cholecalciferol 600-800 MG-UNIT TABS Take 1 tablet by mouth daily.   clotrimazole (MYCELEX) 10 MG troche Take 1 tablet (10 mg  total) by mouth 5 (five) times daily.   dofetilide (TIKOSYN) 250 MCG capsule TAKE 1 CAPSULE BY MOUTH 2 TIMES DAILY (Patient taking differently: Take 250 mcg by mouth 2 (two) times daily.)   ferrous sulfate (SLOW IRON) 160 (50 Fe) MG TBCR SR tablet Take by mouth daily.   fluticasone (FLONASE) 50 MCG/ACT nasal spray USE 2 SPRAYS IN EACH NOSTRIL 2 TIMES DAILY (Patient taking differently: Place 2 sprays into both nostrils 2 (two) times daily.)   levalbuterol (XOPENEX HFA) 45 MCG/ACT inhaler INHALE 1-2 PUFFS into THE lungs EVERY 4 HOURS AS NEEDED   losartan (COZAAR) 100 MG tablet TAKE 1 TABLET BY MOUTH EVERY DAY (Patient taking differently: Take 100 mg by mouth daily.)   magnesium oxide (MAG-OX) 400 MG tablet Take 1 tablet (400 mg total) by mouth daily.   montelukast (SINGULAIR) 10 MG tablet TAKE 1 TABLET BY MOUTH EVERY DAY   potassium chloride (KLOR-CON) 10 MEQ tablet Take 2 tablets (20 mEq total) by mouth daily.   pravastatin (PRAVACHOL) 40 MG tablet Take 40 mg by mouth daily.   Tiotropium Bromide Monohydrate (SPIRIVA RESPIMAT) 2.5 MCG/ACT AERS Inhale 2 puffs into the lungs daily. (Patient taking differently: Inhale 2 puffs into the lungs 2 (two) times daily.)              Objective:   Physical Exam  Wts  08/02/2022         150  07/15/2022         148 12/31/2021       154  09/24/2021  150  08/06/2021           148  01/16/2021       152   07/24/2020         159  10/22/2019       153 09/16/2018        151 07/01/2017        140  02/19/2017      147   03/25/16 156 lb 12.8 oz (71.1 kg)  02/12/16 155 lb 6.4 oz (70.5 kg)  11/28/15 150 lb 12 oz (68.4 kg)    Vital signs reviewed  08/02/2022  - Note at rest 02 sats  96% on RA   General appearance:    pleasantly anxious amb wf nad    HEENT : Oropharynx  clear / no mucosal lesions       NECK :  without  apparent JVD/ palpable Nodes/TM    LUNGS: no acc muscle use,  Mild barrel  contour chest wall with bilateral  Distant bs s audible wheeze  and  without cough on insp or exp maneuvers  and mild  Hyperresonant  to  percussion bilaterally     CV:  RRR  no s3 or murmur or increase in P2, and no edema   ABD:  soft and nontender with pos end  insp Hoover's  in the supine position.  No bruits or organomegaly appreciated   MS:  Nl gait/ ext warm without deformities Or obvious joint restrictions  calf tenderness, cyanosis or clubbing     SKIN: warm and dry with erythematous dry splotchyrash to knees bilaterally  various size macules/ min scaling.   NEURO:  alert, approp, nl sensorium with  no motor or cerebellar deficits apparent.    Assessment & Plan:

## 2022-08-02 NOTE — Patient Instructions (Addendum)
Plan A = Automatic = Always=   Symbicort 160 x 2 puffs 1st each am and again 12 hours later Spiriva 2 pffs each am only   Plan B = Backup (to supplement plan A, not to replace it) Only use your albuterol inhaler as a rescue medication to be used if you can't catch your breath by resting or doing a relaxed purse lip breathing pattern.  - The less you use it, the better it will work when you need it. - Ok to use the inhaler up to 2 puffs  every 4 hours if you must but call for appointment if use goes up over your usual need - Don't leave home without it !!  (think of it like the spare tire for your car)   Plan D = Deltasone if AB  not working well  Prednisone 10 mg take  4 each am x 2 days,   2 each am x 2 days,  1 each am x 2 days and stop   For cough > mucinex  600 1-2 every 12 hours as needed with flutter valve   Please schedule a follow up office visit in 4 weeks, sooner if needed  with all medications /inhalers/ solutions in hand so we can verify exactly what you are taking. This includes all medications from all doctors and over the counters    Please schedule a follow up visit in 3 months but call sooner if needed

## 2022-08-03 ENCOUNTER — Encounter: Payer: Self-pay | Admitting: Internal Medicine

## 2022-08-03 NOTE — Assessment & Plan Note (Addendum)
Counseled re importance of smoking cessation but did not meet time criteria for separate billing    Each maintenance medication was reviewed in detail including emphasizing most importantly the difference between maintenance and prns and under what circumstances the prns are to be triggered using an action plan format where appropriate.  Total time for H and P, chart review, counseling, reviewing hfa/respimat/flutter device(s) and generating customized AVS unique to this office visit / same day charting > 30 min

## 2022-08-03 NOTE — Assessment & Plan Note (Addendum)
Onset "all her life"/ active smoker  Spirometry  09/25/15 FEV1 1.58  (70%)  Ratio 55 on flovent 110 2bid  PFT  09/25/15:  FEV1 1.58 L (70%) FEV1/FVC 0.55   73% DLCO uncorrected 58% - PFT's  07/01/2017  FEV1 1.71 (77 % ) ratio 62  p 6 % improvement from saba p only flovent 110 prior to study with DLCO  60 % corrects to 72  % for alv volume  - 06/01/20  Change to trelegy  > preferred symbicort 160 - .08/06/2021 change to breztri 2 in am and prn the pm doses due to mouth irritation -.09/24/2021  continue breztri   - 12/31/2021  After extensive coaching inhaler device,  effectiveness =    85% > continue Breztri > preferred symb 160 - 07/15/2022  After extensive coaching inhaler device,  effectiveness =    75% SMI try adding spiriva added prednisone x 6 d  as "plan C" on action plan   Better since last dose of prednisone and addition of spiriva though twice as much as intended > strongly doubt new LE rash is related to spiriva or any med (more common on trunk in that case )   Rec  No change rx Continue mucinex/ flutter valve  Remember pred is refillable as part of action plan /reviewed See PCP or derm re new rash

## 2022-08-05 MED ORDER — FAMOTIDINE 20 MG PO TABS: ORAL_TABLET | ORAL | 5 refills | Status: DC

## 2022-08-05 MED ORDER — OMEPRAZOLE 20 MG PO CPDR: DELAYED_RELEASE_CAPSULE | ORAL | 5 refills | Status: DC

## 2022-08-05 NOTE — Telephone Encounter (Signed)
Called patient.  Gave all information.  Send in rx for prilosec otc 20 mg and famotidine 20 mg to [facility].  Patient verbalized understanding.  Nothing further needed at this time.

## 2022-08-05 NOTE — Telephone Encounter (Signed)
Called and spoke with pt letting her know the results of CT of sinuses and she verbalized understanding. Nothing further needed.

## 2022-08-07 DIAGNOSIS — S42201D Unspecified fracture of upper end of right humerus, subsequent encounter for fracture with routine healing: Secondary | ICD-10-CM | POA: Diagnosis not present

## 2022-08-14 DIAGNOSIS — S42201D Unspecified fracture of upper end of right humerus, subsequent encounter for fracture with routine healing: Secondary | ICD-10-CM | POA: Diagnosis not present

## 2022-08-15 ENCOUNTER — Other Ambulatory Visit (HOSPITAL_COMMUNITY): Payer: Self-pay | Admitting: *Deleted

## 2022-08-15 MED ORDER — DOFETILIDE 250 MCG PO CAPS: 250.0000 ug | ORAL_CAPSULE | Freq: Two times a day (BID) | ORAL | 1 refills | Status: DC

## 2022-08-19 DIAGNOSIS — S42201D Unspecified fracture of upper end of right humerus, subsequent encounter for fracture with routine healing: Secondary | ICD-10-CM | POA: Diagnosis not present

## 2022-08-23 DIAGNOSIS — S42201D Unspecified fracture of upper end of right humerus, subsequent encounter for fracture with routine healing: Secondary | ICD-10-CM | POA: Diagnosis not present

## 2022-08-26 DIAGNOSIS — S42201D Unspecified fracture of upper end of right humerus, subsequent encounter for fracture with routine healing: Secondary | ICD-10-CM | POA: Diagnosis not present

## 2022-08-28 DIAGNOSIS — H04222 Epiphora due to insufficient drainage, left lacrimal gland: Secondary | ICD-10-CM | POA: Diagnosis not present

## 2022-08-28 DIAGNOSIS — H5310 Unspecified subjective visual disturbances: Secondary | ICD-10-CM | POA: Diagnosis not present

## 2022-08-30 DIAGNOSIS — S42201D Unspecified fracture of upper end of right humerus, subsequent encounter for fracture with routine healing: Secondary | ICD-10-CM | POA: Diagnosis not present

## 2022-09-03 DIAGNOSIS — S42201D Unspecified fracture of upper end of right humerus, subsequent encounter for fracture with routine healing: Secondary | ICD-10-CM | POA: Diagnosis not present

## 2022-09-09 ENCOUNTER — Ambulatory Visit: Payer: Medicare PPO | Admitting: Internal Medicine

## 2022-09-10 DIAGNOSIS — M25511 Pain in right shoulder: Secondary | ICD-10-CM | POA: Diagnosis not present

## 2022-09-11 DIAGNOSIS — S42201D Unspecified fracture of upper end of right humerus, subsequent encounter for fracture with routine healing: Secondary | ICD-10-CM | POA: Diagnosis not present

## 2022-09-17 DIAGNOSIS — D485 Neoplasm of uncertain behavior of skin: Secondary | ICD-10-CM | POA: Diagnosis not present

## 2022-09-17 DIAGNOSIS — C44729 Squamous cell carcinoma of skin of left lower limb, including hip: Secondary | ICD-10-CM | POA: Diagnosis not present

## 2022-09-18 DIAGNOSIS — S42201D Unspecified fracture of upper end of right humerus, subsequent encounter for fracture with routine healing: Secondary | ICD-10-CM | POA: Diagnosis not present

## 2022-09-20 DIAGNOSIS — S42201D Unspecified fracture of upper end of right humerus, subsequent encounter for fracture with routine healing: Secondary | ICD-10-CM | POA: Diagnosis not present

## 2022-09-24 ENCOUNTER — Ambulatory Visit (HOSPITAL_COMMUNITY)
Admission: RE | Admit: 2022-09-24 | Discharge: 2022-09-24 | Disposition: A | Discharge status: Home / Self Care | Payer: Medicare PPO | Source: Ambulatory Visit | Attending: Physician Assistant | Admitting: Physician Assistant

## 2022-09-24 VITALS — BP 150/76 | HR 68 | Ht 65.0 in | Wt 152.4 lb

## 2022-09-24 DIAGNOSIS — Z79899 Other long term (current) drug therapy: Secondary | ICD-10-CM

## 2022-09-24 DIAGNOSIS — I10 Essential (primary) hypertension: Secondary | ICD-10-CM | POA: Diagnosis not present

## 2022-09-24 DIAGNOSIS — Z5181 Encounter for therapeutic drug level monitoring: Secondary | ICD-10-CM | POA: Diagnosis not present

## 2022-09-24 DIAGNOSIS — D6869 Other thrombophilia: Secondary | ICD-10-CM | POA: Insufficient documentation

## 2022-09-24 DIAGNOSIS — I4892 Unspecified atrial flutter: Secondary | ICD-10-CM | POA: Insufficient documentation

## 2022-09-24 DIAGNOSIS — I480 Paroxysmal atrial fibrillation: Secondary | ICD-10-CM | POA: Insufficient documentation

## 2022-09-24 DIAGNOSIS — Z7901 Long term (current) use of anticoagulants: Secondary | ICD-10-CM | POA: Insufficient documentation

## 2022-09-24 LAB — BASIC METABOLIC PANEL
Anion gap: 8 (ref 5–15)
BUN: 10 mg/dL (ref 8–23)
CO2: 27 mmol/L (ref 22–32)
Calcium: 9.2 mg/dL (ref 8.9–10.3)
Chloride: 98 mmol/L (ref 98–111)
Creatinine, Ser: 0.84 mg/dL (ref 0.44–1.00)
GFR, Estimated: >60 mL/min (ref >60)
Glucose, Bld: 98 mg/dL (ref 70–99)
Potassium: 4.5 mmol/L (ref 3.5–5.1)
Sodium: 133 mmol/L — ABNORMAL LOW (ref 135–145)

## 2022-09-24 LAB — MAGNESIUM: Magnesium: 2 mg/dL (ref 1.7–2.4)

## 2022-09-24 NOTE — Progress Notes (Signed)
    Primary Care Physician: Adrian Prince, MD Primary Cardiologist: None Electrophysiologist: Will Jorja Loa, MD  Referring Physician: Dr Karle Starch is a 77 y.o. female with a history of HTN, atrial flutter, atrial fibrillation who presents for follow up in the [facility].  She is status post atrial fibrillation ablation 11/05/2016 and has been maintained on dofetilide. Patient is on Eliquis for a CHADS2VASC score of 4.  On follow up today, patient reports that she has done well from an afib standpoint. She has not had any interim symptoms. No bleeding issues on anticoagulation. She is working on quitting smoking.   Today, she denies symptoms of palpitations, chest pain, shortness of breath, orthopnea, PND, lower extremity edema, dizziness, presyncope, syncope, snoring, daytime somnolence, bleeding, or neurologic sequela. The patient is tolerating medications without difficulties and is otherwise without complaint today.    Atrial Fibrillation Risk Factors:  she does not have symptoms or diagnosis of sleep apnea. she does not have a history of rheumatic fever.   Atrial Fibrillation Management history:  Previous antiarrhythmic drugs: dofetilide  Previous cardioversions: 11/27/12 Previous ablations: flutter 2014, afib 2018 Anticoagulation history: Eliquis  ROS- All systems are reviewed and negative except as per the HPI above.   Physical Exam: BP (!) 150/76   Pulse 68   Ht 5\' 5"  (1.651 m)   Wt 69.1 kg   BMI 25.36 kg/m   GEN: Well nourished, well developed in no acute distress NECK: No JVD; No carotid bruits CARDIAC: Regular rate and rhythm, no murmurs, rubs, gallops RESPIRATORY:  Clear to auscultation without rales, wheezing or rhonchi  ABDOMEN: Soft, non-tender, non-distended EXTREMITIES:  No edema; No deformity   Wt Readings from Last 3 Encounters:  09/24/22 69.1 kg  08/02/22 68.4 kg  07/15/22 67.1 kg     EKG today  demonstrates  SR Vent. rate 68 BPM PR interval 198 ms QRS duration 94 ms QT/QTcB 444/472 ms  Echo 02/15/15 demonstrated  - Left ventricle: The cavity size was normal. Wall thickness was    normal. Systolic function was normal. The estimated ejection    fraction was in the range of 60% to 65%. Wall motion was normal;    there were no regional wall motion abnormalities. Left    ventricular diastolic function parameters were normal.  - Mitral valve: There was mild regurgitation.  - Atrial septum: No defect or patent foramen ovale was identified.    CHA2DS2-VASc Score = 4  The patient's score is based upon: CHF History: 0 HTN History: 1 Diabetes History: 0 Stroke History: 0 Vascular Disease History: 0 Age Score: 2 Gender Score: 1       ASSESSMENT AND PLAN: Paroxysmal Atrial Fibrillation/atrial flutter The patient's CHA2DS2-VASc score is 4, indicating a 4.8% annual risk of stroke.   S/p flutter ablation 2014 and afib ablation 2018 Patient appears to be maintaining SR Continue dofetilide 250 mcg BID. QT stable Check bmet/mag today Continue Eliquis 5 mg BID  Secondary Hypercoagulable State (ICD10:  D68.69) The patient is at significant risk for stroke/thromboembolism based upon her CHA2DS2-VASc Score of 4.  Continue Apixaban (Eliquis).   HTN Mildly elevated today, typically 120-130 systolic at home. Patient will continue to monitor   Follow up in the AF clinic in 6 months.    Jorja Loa PA-C [facility] [address] [place], [place] [address] [phone]

## 2022-09-25 DIAGNOSIS — S42201D Unspecified fracture of upper end of right humerus, subsequent encounter for fracture with routine healing: Secondary | ICD-10-CM | POA: Diagnosis not present

## 2022-10-07 DIAGNOSIS — S42201D Unspecified fracture of upper end of right humerus, subsequent encounter for fracture with routine healing: Secondary | ICD-10-CM | POA: Diagnosis not present

## 2022-10-09 DIAGNOSIS — S42201D Unspecified fracture of upper end of right humerus, subsequent encounter for fracture with routine healing: Secondary | ICD-10-CM | POA: Diagnosis not present

## 2022-10-14 DIAGNOSIS — S42201D Unspecified fracture of upper end of right humerus, subsequent encounter for fracture with routine healing: Secondary | ICD-10-CM | POA: Diagnosis not present

## 2022-10-16 DIAGNOSIS — D0471 Carcinoma in situ of skin of right lower limb, including hip: Secondary | ICD-10-CM | POA: Diagnosis not present

## 2022-10-16 DIAGNOSIS — L578 Other skin changes due to chronic exposure to nonionizing radiation: Secondary | ICD-10-CM | POA: Diagnosis not present

## 2022-10-16 DIAGNOSIS — D0472 Carcinoma in situ of skin of left lower limb, including hip: Secondary | ICD-10-CM | POA: Diagnosis not present

## 2022-10-18 ENCOUNTER — Other Ambulatory Visit: Payer: Self-pay | Admitting: Internal Medicine

## 2022-10-21 DIAGNOSIS — S42201D Unspecified fracture of upper end of right humerus, subsequent encounter for fracture with routine healing: Secondary | ICD-10-CM | POA: Diagnosis not present

## 2022-10-23 DIAGNOSIS — S42201D Unspecified fracture of upper end of right humerus, subsequent encounter for fracture with routine healing: Secondary | ICD-10-CM | POA: Diagnosis not present

## 2022-10-28 ENCOUNTER — Other Ambulatory Visit (HOSPITAL_COMMUNITY): Payer: Self-pay | Admitting: *Deleted

## 2022-10-28 DIAGNOSIS — I10 Essential (primary) hypertension: Secondary | ICD-10-CM

## 2022-10-28 MED ORDER — LOSARTAN POTASSIUM 100 MG PO TABS: 100.0000 mg | ORAL_TABLET | Freq: Every day | ORAL | 2 refills | Status: DC

## 2022-11-08 DIAGNOSIS — S42201D Unspecified fracture of upper end of right humerus, subsequent encounter for fracture with routine healing: Secondary | ICD-10-CM | POA: Diagnosis not present

## 2022-11-19 DIAGNOSIS — D0471 Carcinoma in situ of skin of right lower limb, including hip: Secondary | ICD-10-CM | POA: Diagnosis not present

## 2022-11-19 DIAGNOSIS — D0472 Carcinoma in situ of skin of left lower limb, including hip: Secondary | ICD-10-CM | POA: Diagnosis not present

## 2022-11-19 DIAGNOSIS — L853 Xerosis cutis: Secondary | ICD-10-CM | POA: Diagnosis not present

## 2022-11-19 DIAGNOSIS — L578 Other skin changes due to chronic exposure to nonionizing radiation: Secondary | ICD-10-CM | POA: Diagnosis not present

## 2022-11-21 ENCOUNTER — Other Ambulatory Visit: Payer: Self-pay | Admitting: Internal Medicine

## 2022-11-25 ENCOUNTER — Other Ambulatory Visit: Payer: Self-pay | Admitting: Internal Medicine

## 2022-12-05 ENCOUNTER — Encounter: Payer: Self-pay | Admitting: *Deleted

## 2022-12-17 ENCOUNTER — Telehealth: Payer: Self-pay | Admitting: Internal Medicine

## 2022-12-17 NOTE — Telephone Encounter (Signed)
Pharmacy calling to see if patient can get a bridge prescription sent in for Tiotropium Bromide Monohydrate (SPIRIVA RESPIMAT) 2.5 MCG/ACT AERS

## 2022-12-20 MED ORDER — SPIRIVA RESPIMAT 2.5 MCG/ACT IN AERS: 2.0000 | INHALATION_SPRAY | Freq: Every day | RESPIRATORY_TRACT | 11 refills | Status: DC

## 2022-12-20 NOTE — Telephone Encounter (Signed)
Rx for Spiriva sent to pharmacy for pt.

## 2023-01-02 ENCOUNTER — Ambulatory Visit: Payer: Medicare PPO | Admitting: Internal Medicine

## 2023-01-06 ENCOUNTER — Ambulatory Visit: Payer: Medicare PPO | Admitting: Internal Medicine

## 2023-01-06 DIAGNOSIS — F419 Anxiety disorder, unspecified: Secondary | ICD-10-CM | POA: Diagnosis not present

## 2023-01-06 DIAGNOSIS — J4520 Mild intermittent asthma, uncomplicated: Secondary | ICD-10-CM | POA: Diagnosis not present

## 2023-01-06 DIAGNOSIS — E0590 Thyrotoxicosis, unspecified without thyrotoxic crisis or storm: Secondary | ICD-10-CM | POA: Diagnosis not present

## 2023-01-06 DIAGNOSIS — I480 Paroxysmal atrial fibrillation: Secondary | ICD-10-CM | POA: Diagnosis not present

## 2023-01-06 DIAGNOSIS — N1831 Chronic kidney disease, stage 3a: Secondary | ICD-10-CM | POA: Diagnosis not present

## 2023-01-06 DIAGNOSIS — F331 Major depressive disorder, recurrent, moderate: Secondary | ICD-10-CM | POA: Diagnosis not present

## 2023-01-06 DIAGNOSIS — Z720 Tobacco use: Secondary | ICD-10-CM | POA: Diagnosis not present

## 2023-01-06 DIAGNOSIS — I700 Atherosclerosis of aorta: Secondary | ICD-10-CM | POA: Diagnosis not present

## 2023-01-08 ENCOUNTER — Other Ambulatory Visit: Payer: Self-pay

## 2023-01-08 DIAGNOSIS — I480 Paroxysmal atrial fibrillation: Secondary | ICD-10-CM

## 2023-01-08 MED ORDER — APIXABAN 5 MG PO TABS: 5.0000 mg | ORAL_TABLET | Freq: Two times a day (BID) | ORAL | 1 refills | Status: DC

## 2023-01-08 NOTE — Telephone Encounter (Signed)
Prescription refill request for Eliquis received. Indication: Afib  Last office visit:09/24/22 ([place])  Scr: 0.84 (09/24/22)  Age: 77 Weight: 69.1kg  Appropriate dose. Refill sent.

## 2023-01-13 DIAGNOSIS — Z85828 Personal history of other malignant neoplasm of skin: Secondary | ICD-10-CM | POA: Diagnosis not present

## 2023-01-13 DIAGNOSIS — Z08 Encounter for follow-up examination after completed treatment for malignant neoplasm: Secondary | ICD-10-CM | POA: Diagnosis not present

## 2023-01-13 DIAGNOSIS — H04222 Epiphora due to insufficient drainage, left lacrimal gland: Secondary | ICD-10-CM | POA: Diagnosis not present

## 2023-01-13 DIAGNOSIS — L578 Other skin changes due to chronic exposure to nonionizing radiation: Secondary | ICD-10-CM | POA: Diagnosis not present

## 2023-01-13 DIAGNOSIS — H02105 Unspecified ectropion of left lower eyelid: Secondary | ICD-10-CM | POA: Diagnosis not present

## 2023-01-13 DIAGNOSIS — D0471 Carcinoma in situ of skin of right lower limb, including hip: Secondary | ICD-10-CM | POA: Diagnosis not present

## 2023-01-13 DIAGNOSIS — D0472 Carcinoma in situ of skin of left lower limb, including hip: Secondary | ICD-10-CM | POA: Diagnosis not present

## 2023-01-21 DIAGNOSIS — Z1231 Encounter for screening mammogram for malignant neoplasm of breast: Secondary | ICD-10-CM | POA: Diagnosis not present

## 2023-01-27 NOTE — Progress Notes (Unsigned)
Subjective:   Patient ID: Elizabeth Mcconnell, female    DOB: October 29, 1945    MRN: [id number]  Brief patient profile:  58  yowf active smoker with "asthma all her life" previous pt of Dr Delford Field and Jamison Neighbor with moderate asthma criteria vs copd GOLD II as of pfts 07/01/2017       History of Present Illness  03/25/2016 acute extended ov/Eydan Chianese re:  Chronic asthma maint flovent hfa ? Still smoking ?  Chief Complaint  Patient presents with   Acute Visit    Pt c/o chest and sinus congestion for the past 2 wks. She feels very tired and states that she has chills "all the time"- no fever. She has had a prod cough with grey sputum.    baseline doing fine on  flovent hfa / singulair not needing any rescue with little flare of cough attributes to  G[place] Peers dec 2017 took doxy and all better until 2 weeks prior to OV with cough thick grey mucus x 2 weeks worse in ams with lots of sinus congestion and grey mucus, some chills but no fever / sweats - even with flare not using xopenex and not sure how much she can "maybe 2 pffs a day"  rec Doxycycline 100 mg twice daily x 10 days Prednisone 10 mg take  4 each am x 2 days,   2 each am x 2 days,  1 each am x 2 days and stop  Work on inhaler technique: For cough / congestion mucinex 1200 mg every 12 hours as needed and use the xopenex hfa more regularly to improve the mucus flow     09/24/2021  f/u ov/Kailia Starry re: AB  maint on breztri/ quit smoking x 2 days prior to OV   Chief Complaint  Patient presents with   Follow-up    Pt states she is about the same since LOV. Pt trying to get use to the [facility] inhaler.   Rec Plan A = Automatic = Always=    Breztri Take 2 puffs first thing in am and then another 2 puffs about 12 hours later.  Work Cabin crew on inhaler technique:  Plan B = Backup (to supplement plan A, not to replace it) Only use your albuterol inhaler as a rescue medication   Ok to try albuterol 15 min before an activity (on alternating days)  that you know  would usually make you short of breath  If still having night time wheeze try adding Pepcid 20 mg after supper or bedtime until return.  Add: LDSCT rec> not done as of 12/31/2021    12/31/2021  f/u ov/Badr Piedra re: copd gold 2/ACOS   maint on breztri 2bid worse x one month/ still smoking  No chief complaint on file. Dyspnea: MMRC2 = can't walk a nl pace on a flat grade s sob but does fine slow and flat  Cough: slt yellow esp in am x 1 m prior to OV  Pos COVID > malnupivir  Sleeping: no resp cc  SABA use: rarely at hs  02: none  Covid status:   vax max x for the new variant  Lung cancer screening :  advised to do it.  Rec Doxycycline 100 mg twice daily x 7 days with large glass of water before eating Prednisone 10 mg take  4 each am x 2 days,   2 each am x 2 days,  1 each am x 2 days and stop  The key is to stop smoking completely before  smoking completely stops you! My office will be contacting you by phone for referral for lung cancer screening  > not done as of 07/15/2022   NP eval cough  06/28/22 Continue levalbuterol inhaler 2 puffs every 6 hours as needed for shortness of breath or wheezing. Notify if symptoms persist despite rescue inhaler/neb use.  Continue Symbicort 2 puffs Twice daily. Brush tongue and rinse mouth afterwards Continue flonase 2 sprays each nostril daily Continue singulair 1 tab At bedtime  Prednisone taper> helped while on it.   07/09/22  PCP Rx Doxy  Depomedrol   07/15/2022  f/u ov/Clarabel Marion re: GOLD 2 copd/AB still smoking maint on symbicort 160   Chief Complaint  Patient presents with   Follow-up    Dyspnea and cough since 06/28/2022.  Taken prednisone, doxycycline.  Sx improved until patient finished meds.  Saw PCP Dr. Evlyn Kanner last week.  Continued Doxycycline and depo medrol injection.  Dyspnea:  MMRC2 = can't walk a nl pace on a flat grade s sob but does fine slow and flat Cough: cough/wheezing no color now  Sleeping: flat bed no resp cc  SABA use: too much  02:  none  Rec  Plan A = Automatic = Always=   Symbicort 160 x 2 puffs 1st each am and again 12 hours later Spiriva 2 pffs each  am  Plan B = Backup (to supplement plan A, not to replace it) Only use your albuterol inhaler as a rescue medication Plan D = Deltasone if AB  not working well  Prednisone 10 mg take  4 each am x 2 days,   2 each am x 2 days,  1 each am x 2 days and stop  For cough > mucinex  600 1-2 every 12 hours as needed with flutter valve  Please schedule a follow up office visit in 4 weeks, sooner if needed  with all medications /inhalers/ solutions in hand  Late add: Try prilosec otc 20mg   Take 30-60 min before first meal of the day and Pepcid ac (famotidine) 20 mg one @  bedtime until cough is completely gone for at least a week without the need for cough suppression  07/24/21  CT sinus R sphenoid dz   08/02/2022  f/u ov/Jayanth Szczesniak re: GOLD 2/AB  maint on symb/spiriva  did not  bring meds  Chief Complaint  Patient presents with   Follow-up    Review medications.  Did take prednisone - helped sx.  Productive cough and dyspnea persistent.  Dyspnea:  up and down steps better  Cough: better / min mucoid  Sleeping: flat bed/ 1-2 pillows s resp cc  SABA use: 1-2 x per day  02: none  New rash on 5/28 both legs/ does not itch Rec Plan A = Automatic = Always=   Symbicort 160 x 2 puffs 1st each am and again 12 hours later Spiriva 2 pffs each am only  Plan B = Backup (to supplement plan A, not to replace it) Only use your albuterol inhaler as a rescue medication Plan D = Deltasone if AB  not working well  Prednisone 10 mg take  4 each am x 2 days,   2 each am x 2 days,  1 each am x 2 days and stop  For cough > mucinex  600 1-2 every 12 hours as needed with flutter valve  Please schedule a follow up office visit in 4 weeks, sooner if needed  with all medications /inhalers/ solutions in hand  01/28/2023  f/u ov/Ingra Rother re: GOLD 2/ AB  maint on symbicort/spiriva  not needing prednisone   Chief Complaint  Patient presents with   Follow-up    Doing well.  Low energy  Dyspnea:  still some problem with steps  Cough: none  Sleeping: flat bed/ one pillow s  resp cc  SABA use: occasionally  02: none    No obvious day to day or daytime variability or assoc excess/ purulent sputum or mucus plugs or hemoptysis or cp or chest tightness, subjective wheeze or overt sinus or hb symptoms.    Also denies any obvious fluctuation of symptoms with weather or environmental changes or other aggravating or alleviating factors except as outlined above   No unusual exposure hx or h/o childhood pna/ asthma or knowledge of premature birth.  Current Allergies, Complete Past Medical History, Past Surgical History, Family History, and Social History were reviewed in [employer] record.  ROS  The following are not active complaints unless bolded Hoarseness, sore throat, dysphagia, dental problems, itching, sneezing,  nasal congestion or discharge of excess mucus or purulent secretions, ear ache,   fever, chills, sweats, unintended wt loss or wt gain, classically pleuritic or exertional cp,  orthopnea pnd or arm/hand swelling  or leg swelling, presyncope, palpitations, abdominal pain, anorexia, nausea, vomiting, diarrhea  or change in bowel habits or change in bladder habits, change in stools or change in urine, dysuria, hematuria,  rash, arthralgias, visual complaints, headache, numbness, weakness or ataxia or problems with walking or coordination,  change in mood or  memory.        Current Meds  Medication Sig   acetaminophen (TYLENOL) 325 MG tablet Take 2 tablets (650 mg total) by mouth every 6 (six) hours as needed for up to 30 doses for moderate pain or mild pain.   ALPRAZolam (XANAX) 0.25 MG tablet Take 1 tablet (0.25 mg total) by mouth at bedtime as needed for anxiety. TAKE 1 TABLET BY MOUTH AT BEDTIME AS NEEDED FOR ANXIETY OR SLEEP Strength: 0.25 mg   apixaban (ELIQUIS)  5 MG TABS tablet Take 1 tablet (5 mg total) by mouth 2 (two) times daily.   budesonide-formoterol (SYMBICORT) 160-4.5 MCG/ACT inhaler Inhale 2 puffs into the lungs in the morning and at bedtime. INHALE 2 PUFFS FIRST THING in THE MORNING, THEN INHALE 2 PUFFS 12 HOURS LATER   busPIRone (BUSPAR) 10 MG tablet Take 10 mg by mouth daily.   BYSTOLIC 5 MG tablet TAKE 1 TABLET BY MOUTH EVERY DAY (Patient taking differently: Take 5 mg by mouth daily.)   Calcium Carb-Cholecalciferol 600-800 MG-UNIT TABS Take 1 tablet by mouth daily.   dofetilide (TIKOSYN) 250 MCG capsule Take 1 capsule (250 mcg total) by mouth 2 (two) times daily.   famotidine (PEPCID) 20 MG tablet Take one tablet at bedtime as directed (Patient taking differently: Take one tablet at bedtime as directed Not sure of the dosage)   fluticasone (FLONASE) 50 MCG/ACT nasal spray USE 2 SPRAYS IN EACH NOSTRIL 2 TIMES DAILY (Patient taking differently: Place 2 sprays into both nostrils 2 (two) times daily.)   levalbuterol (XOPENEX HFA) 45 MCG/ACT inhaler Inhale 1-2 puffs into the lungs every 4 (four) hours as needed for wheezing.   losartan (COZAAR) 100 MG tablet Take 1 tablet (100 mg total) by mouth daily.   magnesium oxide (MAG-OX) 400 MG tablet Take 1 tablet (400 mg total) by mouth daily.   montelukast (SINGULAIR) 10 MG tablet TAKE 1 TABLET BY MOUTH EVERY  DAY   potassium chloride (KLOR-CON) 10 MEQ tablet Take 2 tablets (20 mEq total) by mouth daily.   pravastatin (PRAVACHOL) 40 MG tablet Take 40 mg by mouth daily.   Tiotropium Bromide Monohydrate (SPIRIVA RESPIMAT) 2.5 MCG/ACT AERS Inhale 2 puffs into the lungs daily.   triamcinolone cream (KENALOG) 0.1 % Apply topically 2 (two) times daily.                 Objective:   Physical Exam  Wts  01/28/2023       152  08/02/2022         150  07/15/2022         148 12/31/2021       154  09/24/2021         150  08/06/2021           148  01/16/2021       152   07/24/2020         159  10/22/2019        153 09/16/2018        151 07/01/2017        140  02/19/2017      147   03/25/16 156 lb 12.8 oz (71.1 kg)  02/12/16 155 lb 6.4 oz (70.5 kg)  11/28/15 150 lb 12 oz (68.4 kg)     Vital signs reviewed  01/28/2023  - Note at rest 02 sats  ***% on ***   General appearance:    ***   Mild bar*** Can't abduct  R arm to horizontal   Assessment & Plan:

## 2023-01-28 ENCOUNTER — Encounter: Payer: Self-pay | Admitting: Internal Medicine

## 2023-01-28 ENCOUNTER — Ambulatory Visit: Payer: Medicare PPO | Admitting: Internal Medicine

## 2023-01-28 VITALS — BP 124/78 | HR 81 | Temp 98.1°F | Ht 65.0 in | Wt 152.4 lb

## 2023-01-28 DIAGNOSIS — J4540 Moderate persistent asthma, uncomplicated: Secondary | ICD-10-CM

## 2023-01-28 DIAGNOSIS — F17210 Nicotine dependence, cigarettes, uncomplicated: Secondary | ICD-10-CM

## 2023-01-28 NOTE — Assessment & Plan Note (Signed)
Onset "all her life"/ active smoker  Spirometry  09/25/15 FEV1 1.58  (70%)  Ratio 55 on flovent 110 2bid  PFT  09/25/15:  FEV1 1.58 L (70%) FEV1/FVC 0.55   73% DLCO uncorrected 58% - PFT's  07/01/2017  FEV1 1.71 (77 % ) ratio 62  p 6 % improvement from saba p only flovent 110 prior to study with DLCO  60 % corrects to 72  % for alv volume  - 06/01/20  Change to trelegy  > preferred symbicort 160 - .08/06/2021 change to breztri 2 in am and prn the pm doses due to mouth irritation -.09/24/2021  continue breztri   - 12/31/2021  After extensive coaching inhaler device,  effectiveness =    85% > continue Breztri > preferred symb 160 - 07/15/2022  After extensive coaching inhaler device,  effectiveness =   75% SMI try adding spiriva added prednisone x 6 d  as "plan C" on action plan  -  01/28/2023  After extensive coaching inhaler device,  effectiveness =  70%    Group D (now reclassified as E) in terms of symptom/risk and laba/lama/ICS  therefore appropriate rx at this point >>>  symbiocrt 160/spiriva and approp saba

## 2023-01-28 NOTE — Patient Instructions (Signed)
Work on inhaler technique:  relax and gently blow all the way out then take a nice smooth full deep breath back in, triggering the inhaler at same time you start breathing in.  Hold breath in for at least  5 seconds if you can. Blow out symbicort 160  thru nose. Rinse and gargle with water when done.  If mouth or throat bother you at all,  try brushing teeth/gums/tongue with arm and hammer toothpaste/ make a slurry and gargle and spit out.    The key is to stop smoking completely before smoking completely stops you!   Please schedule a follow up visit in 6  months but call sooner if needed

## 2023-01-29 NOTE — Assessment & Plan Note (Addendum)
Turned down for LDSCT due to limit of age 77  12/03/2022   Counseled re importance of smoking cessation but did not meet time criteria for separate billing    Each maintenance medication was reviewed in detail including emphasizing most importantly the difference between maintenance and prns and under what circumstances the prns are to be triggered using an action plan format where appropriate.  Total time for H and P, chart review, counseling, reviewing hfa  device(s) and generating customized AVS unique to this office visit / same day charting = 25 min

## 2023-02-05 ENCOUNTER — Other Ambulatory Visit: Payer: Self-pay | Admitting: Internal Medicine

## 2023-02-05 DIAGNOSIS — N6001 Solitary cyst of right breast: Secondary | ICD-10-CM | POA: Diagnosis not present

## 2023-02-10 ENCOUNTER — Other Ambulatory Visit (HOSPITAL_COMMUNITY): Payer: Self-pay | Admitting: Physician Assistant

## 2023-03-10 DIAGNOSIS — D0471 Carcinoma in situ of skin of right lower limb, including hip: Secondary | ICD-10-CM | POA: Diagnosis not present

## 2023-03-10 DIAGNOSIS — D0472 Carcinoma in situ of skin of left lower limb, including hip: Secondary | ICD-10-CM | POA: Diagnosis not present

## 2023-03-10 DIAGNOSIS — L578 Other skin changes due to chronic exposure to nonionizing radiation: Secondary | ICD-10-CM | POA: Diagnosis not present

## 2023-03-12 DIAGNOSIS — H02105 Unspecified ectropion of left lower eyelid: Secondary | ICD-10-CM | POA: Diagnosis not present

## 2023-03-22 ENCOUNTER — Other Ambulatory Visit: Payer: Self-pay | Admitting: Internal Medicine

## 2023-03-24 ENCOUNTER — Other Ambulatory Visit: Payer: Self-pay | Admitting: *Deleted

## 2023-03-24 MED ORDER — BUDESONIDE-FORMOTEROL FUMARATE 160-4.5 MCG/ACT IN AERO: 2.0000 | INHALATION_SPRAY | Freq: Two times a day (BID) | RESPIRATORY_TRACT | 6 refills | Status: DC

## 2023-03-24 MED ORDER — BUDESONIDE-FORMOTEROL FUMARATE 160-4.5 MCG/ACT IN AERO: 2.0000 | INHALATION_SPRAY | Freq: Two times a day (BID) | RESPIRATORY_TRACT | 3 refills | Status: AC

## 2023-03-25 ENCOUNTER — Other Ambulatory Visit: Payer: Self-pay | Admitting: Internal Medicine

## 2023-03-31 ENCOUNTER — Ambulatory Visit (HOSPITAL_COMMUNITY)
Admission: RE | Admit: 2023-03-31 | Discharge: 2023-03-31 | Disposition: A | Discharge status: Home / Self Care | Payer: Medicare PPO | Source: Ambulatory Visit | Attending: Physician Assistant | Admitting: Physician Assistant

## 2023-03-31 ENCOUNTER — Other Ambulatory Visit: Payer: Self-pay | Admitting: Internal Medicine

## 2023-03-31 ENCOUNTER — Encounter (HOSPITAL_COMMUNITY): Payer: Self-pay | Admitting: Physician Assistant

## 2023-03-31 VITALS — BP 138/86 | HR 82 | Ht 65.0 in | Wt 153.0 lb

## 2023-03-31 DIAGNOSIS — Z79899 Other long term (current) drug therapy: Secondary | ICD-10-CM | POA: Insufficient documentation

## 2023-03-31 DIAGNOSIS — Z5181 Encounter for therapeutic drug level monitoring: Secondary | ICD-10-CM | POA: Diagnosis not present

## 2023-03-31 DIAGNOSIS — D6869 Other thrombophilia: Secondary | ICD-10-CM | POA: Insufficient documentation

## 2023-03-31 DIAGNOSIS — I10 Essential (primary) hypertension: Secondary | ICD-10-CM | POA: Diagnosis not present

## 2023-03-31 DIAGNOSIS — Z7901 Long term (current) use of anticoagulants: Secondary | ICD-10-CM | POA: Diagnosis not present

## 2023-03-31 DIAGNOSIS — I4892 Unspecified atrial flutter: Secondary | ICD-10-CM | POA: Insufficient documentation

## 2023-03-31 DIAGNOSIS — I480 Paroxysmal atrial fibrillation: Secondary | ICD-10-CM | POA: Diagnosis not present

## 2023-03-31 LAB — BASIC METABOLIC PANEL
Anion gap: 10 (ref 5–15)
BUN: 10 mg/dL (ref 8–23)
CO2: 24 mmol/L (ref 22–32)
Calcium: 9.3 mg/dL (ref 8.9–10.3)
Chloride: 105 mmol/L (ref 98–111)
Creatinine, Ser: 0.88 mg/dL (ref 0.44–1.00)
GFR, Estimated: >60 mL/min (ref >60)
Glucose, Bld: 108 mg/dL — ABNORMAL HIGH (ref 70–99)
Potassium: 4.1 mmol/L (ref 3.5–5.1)
Sodium: 139 mmol/L (ref 135–145)

## 2023-03-31 LAB — CBC
HCT: 44.9 % (ref 36.0–46.0)
Hemoglobin: 14.5 g/dL (ref 12.0–15.0)
MCH: 29.7 pg (ref 26.0–34.0)
MCHC: 32.3 g/dL (ref 30.0–36.0)
MCV: 92 fL (ref 80.0–100.0)
Platelets: 181 10*3/uL (ref 150–400)
RBC: 4.88 MIL/uL (ref 3.87–5.11)
RDW: 14 % (ref 11.5–15.5)
WBC: 8.9 10*3/uL (ref 4.0–10.5)
nRBC: 0 % (ref 0.0–0.2)

## 2023-03-31 LAB — MAGNESIUM: Magnesium: 2.2 mg/dL (ref 1.7–2.4)

## 2023-03-31 NOTE — Progress Notes (Signed)
    Primary Care Physician: Adrian Prince, MD Primary Cardiologist: None Electrophysiologist: Will Jorja Loa, MD  Referring Physician: Dr Karle Starch is a 78 y.o. female with a history of HTN, atrial flutter, atrial fibrillation who presents for follow up in the [facility].  She is status post atrial fibrillation ablation 11/05/2016 and has been maintained on dofetilide. Patient is on Eliquis for a CHADS2VASC score of 4.  Patient returns for follow up for atrial fibrillation. Patient reports that she has done very well since her last visit. She has not had any interim symptoms of afib. No bleeding issues on anticoagulation.   Today, he denies symptoms of palpitations, chest pain, orthopnea, PND, lower extremity edema, dizziness, presyncope, syncope, snoring, daytime somnolence, bleeding, or neurologic sequela. The patient is tolerating medications without difficulties and is otherwise without complaint today.    Atrial Fibrillation Risk Factors:  she does not have symptoms or diagnosis of sleep apnea. she does not have a history of rheumatic fever.   Atrial Fibrillation Management history:  Previous antiarrhythmic drugs: dofetilide  Previous cardioversions: 11/27/12 Previous ablations: flutter 2014, afib 2018 Anticoagulation history: Eliquis  ROS- All systems are reviewed and negative except as per the HPI above.   Physical Exam: BP 138/86   Pulse 82   Ht 5\' 5"  (1.651 m)   Wt 69.4 kg   BMI 25.46 kg/m   GEN: Well nourished, well developed in no acute distress CARDIAC: Regular rate and rhythm, no murmurs, rubs, gallops RESPIRATORY:  Clear to auscultation without rales, wheezing or rhonchi  ABDOMEN: Soft, non-tender, non-distended EXTREMITIES:  No edema; No deformity    Wt Readings from Last 3 Encounters:  03/31/23 69.4 kg  01/28/23 69.1 kg  09/24/22 69.1 kg     EKG today demonstrates  SR, 1st degree AV block,  PACs Vent. rate 82 BPM PR interval 208 ms QRS duration 92 ms QT/QTcB 398/464 ms   Echo 02/15/15 demonstrated  - Left ventricle: The cavity size was normal. Wall thickness was    normal. Systolic function was normal. The estimated ejection    fraction was in the range of 60% to 65%. Wall motion was normal;    there were no regional wall motion abnormalities. Left    ventricular diastolic function parameters were normal.  - Mitral valve: There was mild regurgitation.  - Atrial septum: No defect or patent foramen ovale was identified.    CHA2DS2-VASc Score = 4  The patient's score is based upon: CHF History: 0 HTN History: 1 Diabetes History: 0 Stroke History: 0 Vascular Disease History: 0 Age Score: 2 Gender Score: 1       ASSESSMENT AND PLAN: Paroxysmal Atrial Fibrillation/atrial flutter The patient's CHA2DS2-VASc score is 4, indicating a 4.8% annual risk of stroke.   S/p flutter ablation 2014 and afib ablation 2018 Patient appears to be maintaining SR. Continue dofetilide 250 mcg BID Continue Eliquis 5 mg BID  Secondary Hypercoagulable State (ICD10:  D68.69) The patient is at significant risk for stroke/thromboembolism based upon her CHA2DS2-VASc Score of 4.  Continue Apixaban (Eliquis).   High Risk Medication Monitoring (ICD 10: Z79.899) QT interval stable on ECG for dofetilide monitoring Check bmet/mag today  HTN Stable on current regimen   Follow up with Dr Elberta Fortis in 6 months. AF clinic in one year.    Jorja Loa PA-C [facility] [address] [place], [place] [address] [phone]

## 2023-04-03 ENCOUNTER — Ambulatory Visit (HOSPITAL_COMMUNITY): Payer: Medicare PPO | Admitting: Physician Assistant

## 2023-04-07 ENCOUNTER — Encounter: Payer: Self-pay | Admitting: Pulmonary Disease

## 2023-04-07 ENCOUNTER — Ambulatory Visit: Payer: Medicare PPO | Admitting: Pulmonary Disease

## 2023-04-07 VITALS — BP 138/83 | HR 71 | Temp 97.6°F | Ht 65.0 in | Wt 153.4 lb

## 2023-04-07 DIAGNOSIS — R051 Acute cough: Secondary | ICD-10-CM | POA: Diagnosis not present

## 2023-04-07 DIAGNOSIS — R6883 Chills (without fever): Secondary | ICD-10-CM

## 2023-04-07 DIAGNOSIS — J4541 Moderate persistent asthma with (acute) exacerbation: Secondary | ICD-10-CM | POA: Diagnosis not present

## 2023-04-07 LAB — POCT INFLUENZA A/B
Influenza A, POC: NEGATIVE
Influenza B, POC: NEGATIVE

## 2023-04-07 LAB — POC COVID19 BINAXNOW: SARS Coronavirus 2 Ag: NEGATIVE

## 2023-04-07 MED ORDER — PREDNISONE 10 MG PO TABS
40.0000 mg | ORAL_TABLET | Freq: Every day | ORAL | 0 refills | Status: AC
Start: 2023-04-07 — End: 2023-04-12

## 2023-04-07 MED ORDER — DOXYCYCLINE HYCLATE 100 MG PO TABS: 100.0000 mg | ORAL_TABLET | Freq: Two times a day (BID) | ORAL | 0 refills | Status: DC

## 2023-04-07 NOTE — Progress Notes (Signed)
Elizabeth Mcconnell    [id number]    1945/11/20  Primary Care Physician:South, Jeannett Senior, MD  Referring Physician: Adrian Prince, MD [address] [place],  [place] [address]  Chief complaint: Acute visit for asthma exacerbation  HPI: 78 y.o. who  has a past medical history of Adenomatous polyp of colon (10/2013), Allergic rhinitis, ALLERGIC RHINITIS, ANXIETY, Arthritis, Asthma, Atrial fibrillation with RVR (HCC), Atrial flutter (HCC), Chronic bronchitis (HCC), Diverticulosis, Endometriosis, Essential hypertension (05/30/2014), Hepatic steatosis, Oral candidiasis, Osteopenia (08/2015), Pneumothorax on left (1996), Small bowel obstruction (HCC), and Tobacco abuse.   Discussed the use of AI scribe software for clinical note transcription with the patient, who gave verbal consent to proceed.  She has been experiencing a persistent cough and sneezing for the past week, which has been bothersome. Over the weekend, she felt particularly unwell, describing symptoms of lethargy and a lack of energy. Although she feels slightly better today, she still experiences low energy levels and a persistent cough. The cough is described as producing mostly clear mucus, occasionally with a slight ivory tinge. She also reports sneezing fits and a reduced appetite, having to 'make herself eat something'. No fever, but she experiences chills.  She has a history of asthma and is currently on Symbicort 160, taking two puffs twice daily, Singulair taken every morning, and Spiriva used once daily. She has been experiencing wheezing and chills, feeling 'freezing cold' without any recorded fever. She has not taken her temperature but has not noticed any feverish symptoms. She mentions being allergic to Z-Paks, which previously caused nausea, and prefers doxycycline for treatment when needed.  Socially, she has been very active despite her symptoms, assisting her sister who recently had a hip replacement. She has  been running errands and providing care, which has contributed to her fatigue. Over the weekend, she felt particularly exhausted, although she managed to attend church on Sunday before feeling 'terrible' upon returning home. She expresses feelings of anxiety and a general sense of not feeling right, which is unusual for her.   Outpatient Encounter Medications as of 04/07/2023  Medication Sig   acetaminophen (TYLENOL) 325 MG tablet Take 2 tablets (650 mg total) by mouth every 6 (six) hours as needed for up to 30 doses for moderate pain or mild pain.   ALPRAZolam (XANAX) 0.25 MG tablet Take 1 tablet (0.25 mg total) by mouth at bedtime as needed for anxiety. TAKE 1 TABLET BY MOUTH AT BEDTIME AS NEEDED FOR ANXIETY OR SLEEP Strength: 0.25 mg   apixaban (ELIQUIS) 5 MG TABS tablet Take 1 tablet (5 mg total) by mouth 2 (two) times daily.   budesonide-formoterol (SYMBICORT) 160-4.5 MCG/ACT inhaler Inhale 2 puffs into the lungs 2 (two) times daily.   BYSTOLIC 5 MG tablet TAKE 1 TABLET BY MOUTH EVERY DAY (Patient taking differently: Take 5 mg by mouth daily.)   Calcium Carb-Cholecalciferol 600-800 MG-UNIT TABS Take 1 tablet by mouth daily.   dofetilide (TIKOSYN) 250 MCG capsule TAKE 1 CAPSULE BY MOUTH 2 TIMES DAILY   doxycycline (VIBRA-TABS) 100 MG tablet Take 1 tablet (100 mg total) by mouth 2 (two) times daily.   famotidine (PEPCID) 20 MG tablet TAKE 1 TABLET BY MOUTH AT BEDTIME AS DIRECTED   fluticasone (FLONASE) 50 MCG/ACT nasal spray USE 2 SPRAYS in each nostril 2 TIMES DAILY   levalbuterol (XOPENEX HFA) 45 MCG/ACT inhaler INHALE 1 TO 2 PUFFS INTO THE LUNGS EVERY 4 HOURS AS NEEDED   losartan (COZAAR) 100 MG tablet  Take 1 tablet (100 mg total) by mouth daily.   magnesium oxide (MAG-OX) 400 MG tablet Take 1 tablet (400 mg total) by mouth daily.   montelukast (SINGULAIR) 10 MG tablet TAKE 1 TABLET BY MOUTH EVERY DAY   potassium chloride (KLOR-CON) 10 MEQ tablet Take 2 tablets (20 mEq total) by mouth daily.    pravastatin (PRAVACHOL) 40 MG tablet Take 40 mg by mouth daily.   predniSONE (DELTASONE) 10 MG tablet Take 4 tablets (40 mg total) by mouth daily with breakfast for 5 days.   Tiotropium Bromide Monohydrate (SPIRIVA RESPIMAT) 2.5 MCG/ACT AERS Inhale 2 puffs into the lungs daily.   triamcinolone cream (KENALOG) 0.1 % Apply topically 2 (two) times daily.   omeprazole (PRILOSEC) 20 MG capsule Take one capsule 30-60 min before first meal of the day (Patient not taking: Reported on 04/07/2023)   No facility-administered encounter medications on file as of 04/07/2023.   Physical Exam: Blood pressure 138/83, pulse 71, temperature 97.6 F (36.4 C), height 5\' 5"  (1.651 m), weight 153 lb 6.4 oz (69.6 kg), SpO2 96%. Gen:      No acute distress HEENT:  EOMI, sclera anicteric Neck:     No masses; no thyromegaly Lungs:    Bilateral expiratory wheeze CV:         Regular rate and rhythm; no murmurs Abd:      + bowel sounds; soft, non-tender; no palpable masses, no distension Ext:    No edema; adequate peripheral perfusion Skin:      Warm and dry; no rash Neuro: alert and oriented x 3 Psych: normal mood and affect  Data Reviewed: Imaging: Chest x-ray 06/28/2022-no acute cardiopulmonary disease, chronic hyperinflation I had reviewed the images personally.  PFTs:  Labs:  Assessment and Plan Asthma Exacerbation  Presents with a one-week history of cough, sneezing, and lethargy, with worsening symptoms over the weekend. Reports clear mucus with occasional slight discoloration, chills without fever, and wheezing on auscultation. Symptoms and physical findings suggest an asthma exacerbation, potentially complicated by acute bronchitis. Managed with Symbicort, Singulair, and Spiriva. Negative for COVID-19 and flu in office today. Allergic to azithromycin, causing nausea and vomiting. Prefers doxycycline due to past tolerance.  - Prescribe doxycycline for 7 days - Prescribe prednisone for 5 days - Send  prescriptions to [facility]  General Health Maintenance Asthma managed with Symbicort, Singulair, and Spiriva. Aware of medication regimen and known allergy to azithromycin. - Continue current asthma medications (Symbicort, Singulair, Spiriva).   Recommendations: Doxycycline, prednisone for 5 days  Chilton Greathouse MD [facility] 04/07/2023, 4:06 PM  CC: Adrian Prince, MD

## 2023-04-07 NOTE — Addendum Note (Signed)
Addended byFloydene Flock, Mysti Haley A on: 04/07/2023 04:20 PM   Modules accepted: Orders

## 2023-04-07 NOTE — Patient Instructions (Signed)
VISIT SUMMARY:  You came in today because you have been experiencing a persistent cough and sneezing for the past week, along with low energy levels and chills. You have a history of asthma and are currently on Symbicort, Singulair, and Spiriva. Despite your symptoms, you have been very active, helping your sister who recently had a hip replacement. You also mentioned feeling anxious and generally unwell, which is unusual for you.  YOUR PLAN:  -ASTHMA EXACERBATION: An asthma exacerbation means that your asthma symptoms have worsened. This can be triggered by various factors, including infections or allergens. You are experiencing a persistent cough, sneezing, and chills without a fever, and wheezing was noted during your exam. To manage this, you have been prescribed doxycycline for 7 days and prednisone for 5 days. Your prescriptions have been sent to [facility].  -GENERAL HEALTH MAINTENANCE: Your asthma is being managed with your current medications: Symbicort, Singulair, and Spiriva. Please continue taking these medications as prescribed. You are aware of your allergy to azithromycin and prefer doxycycline, which has been noted and accommodated in your treatment plan.  INSTRUCTIONS:  Please follow up with [place] if your symptoms do not improve or if they worsen. Continue taking your asthma medications as prescribed. Your prescriptions for doxycycline and prednisone have been sent to [facility]. If you have any questions or concerns, do not hesitate to contact our office.

## 2023-05-14 ENCOUNTER — Other Ambulatory Visit: Payer: Self-pay | Admitting: Internal Medicine

## 2023-06-10 ENCOUNTER — Other Ambulatory Visit: Payer: Self-pay | Admitting: Cardiology

## 2023-06-10 DIAGNOSIS — D492 Neoplasm of unspecified behavior of bone, soft tissue, and skin: Secondary | ICD-10-CM | POA: Diagnosis not present

## 2023-06-10 DIAGNOSIS — C44722 Squamous cell carcinoma of skin of right lower limb, including hip: Secondary | ICD-10-CM | POA: Diagnosis not present

## 2023-06-10 DIAGNOSIS — D0472 Carcinoma in situ of skin of left lower limb, including hip: Secondary | ICD-10-CM | POA: Diagnosis not present

## 2023-06-18 ENCOUNTER — Other Ambulatory Visit: Payer: Self-pay | Admitting: Internal Medicine

## 2023-06-18 ENCOUNTER — Other Ambulatory Visit: Payer: Self-pay | Admitting: Cardiology

## 2023-06-18 DIAGNOSIS — I480 Paroxysmal atrial fibrillation: Secondary | ICD-10-CM

## 2023-06-18 NOTE — Telephone Encounter (Signed)
 Prescription refill request for Eliquis received. Indication:afib Last office visit:1/25 Scr:0.88  1/25 Age: 78 Weight:69.6  kg  Prescription refilled

## 2023-07-07 DIAGNOSIS — E785 Hyperlipidemia, unspecified: Secondary | ICD-10-CM | POA: Diagnosis not present

## 2023-07-07 DIAGNOSIS — N1831 Chronic kidney disease, stage 3a: Secondary | ICD-10-CM | POA: Diagnosis not present

## 2023-07-07 DIAGNOSIS — D509 Iron deficiency anemia, unspecified: Secondary | ICD-10-CM | POA: Diagnosis not present

## 2023-07-07 DIAGNOSIS — E0590 Thyrotoxicosis, unspecified without thyrotoxic crisis or storm: Secondary | ICD-10-CM | POA: Diagnosis not present

## 2023-07-07 DIAGNOSIS — M8580 Other specified disorders of bone density and structure, unspecified site: Secondary | ICD-10-CM | POA: Diagnosis not present

## 2023-07-14 DIAGNOSIS — Z720 Tobacco use: Secondary | ICD-10-CM | POA: Diagnosis not present

## 2023-07-14 DIAGNOSIS — Z0000 Encounter for general adult medical examination without abnormal findings: Secondary | ICD-10-CM | POA: Diagnosis not present

## 2023-07-14 DIAGNOSIS — I480 Paroxysmal atrial fibrillation: Secondary | ICD-10-CM | POA: Diagnosis not present

## 2023-07-14 DIAGNOSIS — F331 Major depressive disorder, recurrent, moderate: Secondary | ICD-10-CM | POA: Diagnosis not present

## 2023-07-14 DIAGNOSIS — E0590 Thyrotoxicosis, unspecified without thyrotoxic crisis or storm: Secondary | ICD-10-CM | POA: Diagnosis not present

## 2023-07-14 DIAGNOSIS — F419 Anxiety disorder, unspecified: Secondary | ICD-10-CM | POA: Diagnosis not present

## 2023-07-14 DIAGNOSIS — R82998 Other abnormal findings in urine: Secondary | ICD-10-CM | POA: Diagnosis not present

## 2023-07-14 DIAGNOSIS — D0472 Carcinoma in situ of skin of left lower limb, including hip: Secondary | ICD-10-CM | POA: Diagnosis not present

## 2023-07-14 DIAGNOSIS — J4520 Mild intermittent asthma, uncomplicated: Secondary | ICD-10-CM | POA: Diagnosis not present

## 2023-07-14 DIAGNOSIS — N1831 Chronic kidney disease, stage 3a: Secondary | ICD-10-CM | POA: Diagnosis not present

## 2023-07-21 ENCOUNTER — Other Ambulatory Visit (HOSPITAL_COMMUNITY): Payer: Self-pay | Admitting: Physician Assistant

## 2023-07-21 ENCOUNTER — Other Ambulatory Visit: Payer: Self-pay | Admitting: Internal Medicine

## 2023-07-21 DIAGNOSIS — I10 Essential (primary) hypertension: Secondary | ICD-10-CM

## 2023-07-21 DIAGNOSIS — D0471 Carcinoma in situ of skin of right lower limb, including hip: Secondary | ICD-10-CM | POA: Diagnosis not present

## 2023-08-05 ENCOUNTER — Other Ambulatory Visit: Payer: Self-pay | Admitting: Internal Medicine

## 2023-08-11 ENCOUNTER — Other Ambulatory Visit (HOSPITAL_COMMUNITY): Payer: Self-pay | Admitting: Physician Assistant

## 2023-08-27 ENCOUNTER — Other Ambulatory Visit (HOSPITAL_COMMUNITY): Payer: Self-pay | Admitting: *Deleted

## 2023-08-27 MED ORDER — POTASSIUM CHLORIDE ER 10 MEQ PO TBCR: 20.0000 meq | EXTENDED_RELEASE_TABLET | Freq: Every day | ORAL | 6 refills | Status: AC

## 2023-09-01 DIAGNOSIS — Z48817 Encounter for surgical aftercare following surgery on the skin and subcutaneous tissue: Secondary | ICD-10-CM | POA: Diagnosis not present

## 2023-09-30 DIAGNOSIS — E0590 Thyrotoxicosis, unspecified without thyrotoxic crisis or storm: Secondary | ICD-10-CM | POA: Diagnosis not present

## 2023-09-30 DIAGNOSIS — F419 Anxiety disorder, unspecified: Secondary | ICD-10-CM | POA: Diagnosis not present

## 2023-09-30 DIAGNOSIS — N1831 Chronic kidney disease, stage 3a: Secondary | ICD-10-CM | POA: Diagnosis not present

## 2023-09-30 DIAGNOSIS — L282 Other prurigo: Secondary | ICD-10-CM | POA: Diagnosis not present

## 2023-09-30 DIAGNOSIS — F331 Major depressive disorder, recurrent, moderate: Secondary | ICD-10-CM | POA: Diagnosis not present

## 2023-09-30 DIAGNOSIS — Z720 Tobacco use: Secondary | ICD-10-CM | POA: Diagnosis not present

## 2023-09-30 DIAGNOSIS — I480 Paroxysmal atrial fibrillation: Secondary | ICD-10-CM | POA: Diagnosis not present

## 2023-09-30 DIAGNOSIS — J4520 Mild intermittent asthma, uncomplicated: Secondary | ICD-10-CM | POA: Diagnosis not present

## 2023-09-30 LAB — LAB REPORT - SCANNED: EGFR (Non-African Amer.): 69

## 2023-10-01 DIAGNOSIS — Z48817 Encounter for surgical aftercare following surgery on the skin and subcutaneous tissue: Secondary | ICD-10-CM | POA: Diagnosis not present

## 2023-10-08 DIAGNOSIS — L249 Irritant contact dermatitis, unspecified cause: Secondary | ICD-10-CM | POA: Diagnosis not present

## 2023-10-08 DIAGNOSIS — L578 Other skin changes due to chronic exposure to nonionizing radiation: Secondary | ICD-10-CM | POA: Diagnosis not present

## 2023-10-15 DIAGNOSIS — H04222 Epiphora due to insufficient drainage, left lacrimal gland: Secondary | ICD-10-CM | POA: Diagnosis not present

## 2023-10-15 DIAGNOSIS — Z961 Presence of intraocular lens: Secondary | ICD-10-CM | POA: Diagnosis not present

## 2023-10-23 ENCOUNTER — Other Ambulatory Visit: Payer: Self-pay | Admitting: Internal Medicine

## 2023-10-27 DIAGNOSIS — L578 Other skin changes due to chronic exposure to nonionizing radiation: Secondary | ICD-10-CM | POA: Diagnosis not present

## 2023-10-27 DIAGNOSIS — L853 Xerosis cutis: Secondary | ICD-10-CM | POA: Diagnosis not present

## 2023-10-27 DIAGNOSIS — L570 Actinic keratosis: Secondary | ICD-10-CM | POA: Diagnosis not present

## 2023-10-29 ENCOUNTER — Ambulatory Visit: Admitting: Primary Care

## 2023-10-29 ENCOUNTER — Ambulatory Visit: Payer: Self-pay | Admitting: Primary Care

## 2023-10-29 ENCOUNTER — Encounter: Payer: Self-pay | Admitting: Primary Care

## 2023-10-29 ENCOUNTER — Ambulatory Visit: Payer: Self-pay | Admitting: Internal Medicine

## 2023-10-29 ENCOUNTER — Ambulatory Visit (INDEPENDENT_AMBULATORY_CARE_PROVIDER_SITE_OTHER)

## 2023-10-29 VITALS — BP 126/72 | HR 76 | Temp 97.7°F | Ht 65.0 in | Wt 148.4 lb

## 2023-10-29 DIAGNOSIS — J45909 Unspecified asthma, uncomplicated: Secondary | ICD-10-CM | POA: Diagnosis not present

## 2023-10-29 DIAGNOSIS — J440 Chronic obstructive pulmonary disease with acute lower respiratory infection: Secondary | ICD-10-CM | POA: Diagnosis not present

## 2023-10-29 DIAGNOSIS — J209 Acute bronchitis, unspecified: Secondary | ICD-10-CM | POA: Diagnosis not present

## 2023-10-29 DIAGNOSIS — R051 Acute cough: Secondary | ICD-10-CM

## 2023-10-29 DIAGNOSIS — F17210 Nicotine dependence, cigarettes, uncomplicated: Secondary | ICD-10-CM | POA: Diagnosis not present

## 2023-10-29 DIAGNOSIS — J449 Chronic obstructive pulmonary disease, unspecified: Secondary | ICD-10-CM | POA: Diagnosis not present

## 2023-10-29 LAB — POC COVID19 BINAXNOW: SARS Coronavirus 2 Ag: NEGATIVE

## 2023-10-29 MED ORDER — DOXYCYCLINE HYCLATE 100 MG PO TABS: 100.0000 mg | ORAL_TABLET | Freq: Two times a day (BID) | ORAL | 0 refills | Status: DC

## 2023-10-29 NOTE — Patient Instructions (Addendum)
  VISIT SUMMARY: Today, you were seen for a sudden onset of cough and shortness of breath that started three days ago. You have a history of COPD and asthma, and your symptoms have worsened recently, leading to increased use of your rescue inhaler. You have also resumed smoking four months ago after quitting for a year.  YOUR PLAN: -ACUTE BRONCHITIS WITH CHRONIC OBSTRUCTIVE PULMONARY DISEASE AND ASTHMA: Acute bronchitis is an infection of the airways, and in your case, it has worsened your COPD and asthma, causing a productive cough and shortness of breath. We will perform a chest x-ray and a COVID-19 swab to rule out other causes. You are prescribed doxycycline  to treat the infection and advised to take over-the-counter Mucinex ([phone] mg) twice a day to help with mucus. Continue using Symbicort  and Spiriva  as usual, and use your albuterol  rescue inhaler every 4-6 hours as needed.  -TOBACCO USE DISORDER: Tobacco use disorder is a dependence on tobacco products. You have recently relapsed into smoking due to stress. Quitting smoking is very important for your lung health, especially with your current respiratory issues. We encourage you to continue your efforts to quit smoking.  INSTRUCTIONS: Please follow up with the chest x-ray and COVID-19 swab as soon as possible. Continue taking your prescribed medications and using your inhalers as directed. If your symptoms worsen or do not improve, please contact our office.  Orders: Covid swab CXR  Rx: Doxycycline  Pick up mucinex over the counter  Follow-up Dr. DARLEAN for regular over view in the next 1-2 months

## 2023-10-29 NOTE — Progress Notes (Signed)
 @Patient  ID: Elizabeth Mcconnell, female    DOB: 03-12-1945, 78 y.o.   MRN: [id number]  Chief Complaint  Patient presents with   Shortness of Breath    Pt complains of s.o.b with exertion. Mild- moderate severity. States this has went on for several weeks, when she started to move.    Cough    X3 days go, started in the middle of the night- Wet, sometimes the mucous is clear and then it may also have a yellow color to it. When she lays down, she can hear a rattle and she will keep coughing until she can cough the mucous up. Cough is worse at night    Referring provider: Nichole Senior, MD  HPI: 78 y.o. who  has a past medical history of Adenomatous polyp of colon (10/2013), Allergic rhinitis, ALLERGIC RHINITIS, ANXIETY, Arthritis, Asthma, Atrial fibrillation with RVR (HCC), Atrial flutter (HCC), Chronic bronchitis (HCC), Diverticulosis, Endometriosis, Essential hypertension (05/30/2014), Hepatic steatosis, Oral candidiasis, Osteopenia (08/2015), Pneumothorax on left (1996), Small bowel obstruction (HCC), and Tobacco abuse.   PFT's  07/01/2017  FEV1 1.71 (77 % ) ratio 62  p 6 % improvement from saba p only flovent  110 prior to study with DLCO  60 % corrects to 72  % for alv volume   Previous [facility] pulmonary encounter: 04/07/23- Dr. Theophilus Discussed the use of AI scribe software for clinical note transcription with the patient, who gave verbal consent to proceed.  She has been experiencing a persistent cough and sneezing for the past week, which has been bothersome. Over the weekend, she felt particularly unwell, describing symptoms of lethargy and a lack of energy. Although she feels slightly better today, she still experiences low energy levels and a persistent cough. The cough is described as producing mostly clear mucus, occasionally with a slight ivory tinge. She also reports sneezing fits and a reduced appetite, having to 'make herself eat something'. No fever, but she experiences chills.  She  has a history of asthma and is currently on Symbicort  160, taking two puffs twice daily, Singulair  taken every morning, and Spiriva  used once daily. She has been experiencing wheezing and chills, feeling 'freezing cold' without any recorded fever. She has not taken her temperature but has not noticed any feverish symptoms. She mentions being allergic to Z-Paks, which previously caused nausea, and prefers doxycycline  for treatment when needed.  Socially, she has been very active despite her symptoms, assisting her sister who recently had a hip replacement. She has been running errands and providing care, which has contributed to her fatigue. Over the weekend, she felt particularly exhausted, although she managed to attend church on Sunday before feeling 'terrible' upon returning home. She expresses feelings of anxiety and a general sense of not feeling right, which is unusual for her.    10/29/2023- Interim hx  Discussed the use of AI scribe software for clinical note transcription with the patient, who gave verbal consent to proceed.  History of Present Illness Elizabeth Mcconnell is an 78 year old female with COPD and asthma who presents with cough and shortness of breath.  She experienced a sudden onset of cough three nights ago, which disrupts her sleep. The cough is described as 'horrible sounding' and is productive of mucus, particularly worsening at night.  She also experiences shortness of breath, which has intensified over the past few days. She has been using her rescue inhaler more frequently than usual, whereas some days she previously did not need it at all.  Her medical history includes COPD and asthma. She is currently using Symbicort  and Spiriva  for management. She has a history of smoking, having quit about a year ago but resumed smoking four months ago due to stress from a recent move.  No fever or chest pain is reported. She recalls having COVID-19 once before, with  symptoms of fatigue and low energy, and was treated with an antiviral medication that was not Paxlovid due to her use of blood thinners and Tikosyn .    Allergies  Allergen Reactions   Tramadol Other (See Comments)    Reaction:  Dizziness and weakness    Amoxicillin -Pot Clavulanate Nausea And Vomiting and Other (See Comments)    Has patient had a PCN reaction causing immediate rash, facial/tongue/throat swelling, SOB or lightheadedness with hypotension: No Has patient had a PCN reaction causing severe rash involving mucus membranes or skin necrosis: No Has patient had a PCN reaction that required hospitalization No Has patient had a PCN reaction occurring within the last 10 years: No If all of the above answers are NO, then may proceed with Cephalosporin use.   Amoxicillin  Other (See Comments)   Azithromycin  Nausea And Vomiting   Flagyl  [Metronidazole ] Nausea And Vomiting   Flecainide  Other (See Comments)    Dizziness    Gabapentin      Shaking all over, fingers were numb and trouble breathing   Omnicef  [Cefdinir ] Nausea And Vomiting   Avelox [Moxifloxacin] Hives and Itching   Clavulanic Acid Other (See Comments)    Immunization History  Administered Date(s) Administered   DTaP 04/19/2012   Fluad Quad(high Dose 65+) 12/20/2019, 12/02/2020, 12/31/2021   INFLUENZA, HIGH DOSE SEASONAL PF 11/18/2015, 12/11/2016, 11/24/2017, 12/07/2018   Influenza Split 12/03/2010   Influenza Whole 12/02/2008, 12/20/2011   Influenza,inj,Quad PF,6+ Mos 11/17/2012   Influenza-Unspecified 01/03/2014, 11/11/2014   PFIZER(Purple Top)SARS-COV-2 Vaccination 03/25/2019, 04/15/2019, 12/06/2019   Pneumococcal Conjugate-13 06/13/2014   Pneumococcal Polysaccharide-23 12/03/2007, 04/19/2012, 10/14/2018   Tdap 04/07/2012   Zoster Recombinant(Shingrix ) 12/18/2018   Zoster, Live 03/04/1998    Past Medical History:  Diagnosis Date   Adenomatous polyp of colon 10/2013   f/u colo 10/2018   Allergic rhinitis     ALLERGIC RHINITIS    ANXIETY    situational (02/14/2015)   Arthritis    neck, back, hands (02/14/2015)   Asthma    Atrial fibrillation with RVR (HCC)    Atrial flutter (HCC)    failed cardioversion 11/2012; s/p ablation 02-02-2013 by Dr Kelsie   Chronic bronchitis (HCC)    I'll have it most years (02/14/2015)   Diverticulosis    Endometriosis    Essential hypertension 05/30/2014   Hepatic steatosis    Oral candidiasis    Osteopenia 08/2015   T score -1.9 FRAX 16% / 6.5%   Pneumothorax on left 1996   left lower lung collapse s/p resection   Small bowel obstruction (HCC)    Tobacco abuse     Tobacco History: Social History   Tobacco Use  Smoking Status Every Day   Current packs/day: 0.25   Types: Cigarettes  Smokeless Tobacco Never  Tobacco Comments   Pt smokes 1-2 cigarettes a day. AB, CMA 10-29-23      Trying to quit and stopped but started smoking again at end August 2024.  Patient smokes about a pack a week. 04/07/23 NM, CMA   Ready to quit: Not Answered Counseling given: Not Answered Tobacco comments: Pt smokes 1-2 cigarettes a day. AB, CMA 10-29-23  Trying to quit and stopped  but started smoking again at end August 2024.  Patient smokes about a pack a week. 04/07/23 NM, CMA   Outpatient Medications Prior to Visit  Medication Sig Dispense Refill   acetaminophen  (TYLENOL ) 325 MG tablet Take 2 tablets (650 mg total) by mouth every 6 (six) hours as needed for up to 30 doses for moderate pain or mild pain. 30 tablet 0   ALPRAZolam  (XANAX ) 0.25 MG tablet Take 1 tablet (0.25 mg total) by mouth at bedtime as needed for anxiety. TAKE 1 TABLET BY MOUTH AT BEDTIME AS NEEDED FOR ANXIETY OR SLEEP Strength: 0.25 mg 10 tablet 0   budesonide -formoterol  (SYMBICORT ) 160-4.5 MCG/ACT inhaler Inhale 2 puffs into the lungs 2 (two) times daily. 3 each 3   BYSTOLIC  5 MG tablet TAKE 1 TABLET BY MOUTH EVERY DAY (Patient taking differently: Take 5 mg by mouth daily.) 90 tablet 0   Calcium   Carb-Cholecalciferol 600-800 MG-UNIT TABS Take 1 tablet by mouth daily.     dofetilide  (TIKOSYN ) 250 MCG capsule TAKE 1 CAPSULE BY MOUTH 2 TIMES DAILY 180 capsule 1   doxycycline  (VIBRA -TABS) 100 MG tablet Take 1 tablet (100 mg total) by mouth 2 (two) times daily. 14 tablet 0   ELIQUIS  5 MG TABS tablet TAKE 1 TABLET TWICE DAILY 180 tablet 3   famotidine  (PEPCID ) 20 MG tablet TAKE 1 TABLET BY MOUTH AT BEDTIME AS DIRECTED 30 tablet 5   fluticasone  (FLONASE ) 50 MCG/ACT nasal spray USE 2 SPRAYS in each nostril 2 TIMES DAILY 16 g 11   levalbuterol  (XOPENEX  HFA) 45 MCG/ACT inhaler Inhale 1-2 puffs into the lungs every 4 (four) hours as needed for wheezing. 90 g 3   losartan  (COZAAR ) 100 MG tablet TAKE 1 TABLET BY MOUTH ONCE DAILY 90 tablet 2   magnesium  oxide (MAG-OX) 400 MG tablet Take 1 tablet (400 mg total) by mouth daily. 30 tablet 6   montelukast  (SINGULAIR ) 10 MG tablet TAKE 1 TABLET BY MOUTH EVERY DAY 90 tablet 1   omeprazole  (PRILOSEC) 20 MG capsule Take one capsule 30-60 min before first meal of the day 30 capsule 5   potassium chloride  (KLOR-CON ) 10 MEQ tablet Take 2 tablets (20 mEq total) by mouth daily. 60 tablet 6   pravastatin  (PRAVACHOL ) 40 MG tablet Take 40 mg by mouth daily.     SPIRIVA  RESPIMAT 2.5 MCG/ACT AERS INHALE 2 PUFFS EVERY DAY 12 g 3   triamcinolone  cream (KENALOG ) 0.1 % Apply topically 2 (two) times daily.     No facility-administered medications prior to visit.      Review of Systems  Review of Systems  Constitutional: Negative.   HENT: Negative.    Respiratory:  Positive for cough and shortness of breath. Negative for chest tightness and wheezing.   Cardiovascular: Negative.    Physical Exam  BP 126/72   Pulse 76 Comment: RA  Temp 97.7 F (36.5 C)   Ht 5' 5 (1.651 m)   Wt 148 lb 6.4 oz (67.3 kg)   SpO2 95% Comment: RA  BMI 24.70 kg/m  Physical Exam Constitutional:      Appearance: She is well-developed.  HENT:     Head: Normocephalic and  atraumatic.  Cardiovascular:     Rate and Rhythm: Normal rate and regular rhythm.  Pulmonary:     Effort: Pulmonary effort is normal. No respiratory distress.     Breath sounds: Examination of the right-upper field reveals rhonchi. Examination of the left-upper field reveals rhonchi. Examination of the right-middle field reveals rhonchi.  Examination of the left-middle field reveals rhonchi. Examination of the right-lower field reveals rhonchi. Examination of the left-lower field reveals rhonchi. Rhonchi present. No wheezing.  Musculoskeletal:        General: Normal range of motion.  Skin:    General: Skin is warm and dry.  Neurological:     General: No focal deficit present.     Mental Status: She is alert and oriented to person, place, and time.  Psychiatric:        Mood and Affect: Mood normal.        Behavior: Behavior normal.     Lab Results:  CBC    Component Value Date/Time   WBC 8.9 03/31/2023 1356   RBC 4.88 03/31/2023 1356   HGB 14.5 03/31/2023 1356   HGB 13.5 08/09/2020 1249   HCT 44.9 03/31/2023 1356   HCT 40.7 08/09/2020 1249   PLT 181 03/31/2023 1356   PLT 172 08/09/2020 1249   MCV 92.0 03/31/2023 1356   MCV 89 08/09/2020 1249   MCH 29.7 03/31/2023 1356   MCHC 32.3 03/31/2023 1356   RDW 14.0 03/31/2023 1356   RDW 13.4 08/09/2020 1249   LYMPHSABS 1.0 04/24/2022 0944   LYMPHSABS 1.7 10/23/2016 1009   MONOABS 0.5 04/24/2022 0944   EOSABS 0.1 04/24/2022 0944   EOSABS 0.1 10/23/2016 1009   BASOSABS 0.0 04/24/2022 0944   BASOSABS 0.0 10/23/2016 1009    BMET    Component Value Date/Time   NA 139 03/31/2023 1356   NA 142 03/21/2022 1240   K 4.1 03/31/2023 1356   CL 105 03/31/2023 1356   CO2 24 03/31/2023 1356   GLUCOSE 108 (H) 03/31/2023 1356   BUN 10 03/31/2023 1356   BUN 12 03/21/2022 1240   CREATININE 0.88 03/31/2023 1356   CREATININE 0.81 05/18/2015 1502   CALCIUM  9.3 03/31/2023 1356   GFRNONAA >60 03/31/2023 1356   GFRAA 76 06/24/2019 1605     BNP No results found for: BNP  ProBNP    Component Value Date/Time   PROBNP 74.8 03/02/2008 0935    Imaging: No results found.   Assessment & Plan:   1. Acute cough (Primary) - POC COVID-19 - DG Chest 2 View; Future  Assessment and Plan Assessment & Plan Acute bronchitis with chronic obstructive pulmonary disease and asthma Acute bronchitis with exacerbation of COPD and asthma, presenting with a productive cough, shortness of breath, and increased use of rescue inhaler. Symptoms began three days ago. No fever or chest pain reported. Differential includes COVID-19, for which a swab will be performed. Examination reveals rhonchi through lung fields without wheezing. She has obstructive lung disease with features of asthma, managed with Symbicort  and Spiriva . - Order chest x-ray - Swab for COVID-19 - Prescribe doxycycline  100mg  twice daily x 7 days  - Recommend over-the-counter Mucinex, [phone] mg twice a day - Continue Symbicort  and Spiriva  as directed  - Advise use of albuterol  rescue inhaler every 4-6 hours as needed  Tobacco use disorder Tobacco use disorder with recent relapse four months ago due to stress from moving. Currently attempting to quit smoking again. Smoking cessation is crucial given the exacerbation of respiratory symptoms. - Encourage smoking cessation efforts   Elizabeth LELON Ferrari, NP 10/29/2023

## 2023-10-29 NOTE — Telephone Encounter (Signed)
 FYI Only or Action Required?: FYI only for provider.  Patient is followed in Pulmonology for asthma, last seen on 04/07/2023 by Mannam, Praveen, MD.  Called Nurse Triage reporting Cough and Shortness of Breath.  Symptoms began several days ago.  Interventions attempted: Rescue inhaler, Maintenance inhaler, and Increased fluids/rest.  Symptoms are: unchanged.  Triage Disposition: See HCP Within 4 Hours (Or PCP Triage)  Patient/caregiver understands and will follow disposition?: Yes  Copied from CRM #[id number]. Topic: Clinical - Red Word Triage >> Oct 29, 2023  9:05 AM [name] wrote: Red Word that prompted transfer to Nurse Triage: Past few days experiencing terrible cough, stuffy nose & SOB Reason for Disposition  [1] MILD difficulty breathing (e.g., minimal/no SOB at rest, SOB with walking, pulse < 100) AND [2] NEW-onset or WORSE than normal  Answer Assessment - Initial Assessment Questions 1. RESPIRATORY STATUS: Describe your breathing? (e.g., wheezing, shortness of breath, unable to speak, severe coughing)      Shortness of breath, severe coughing 2. ONSET: When did this breathing problem begin?      2-3 days ago 3. PATTERN Does the difficult breathing come and go, or has it been constant since it started?      constant 4. SEVERITY: How bad is your breathing? (e.g., mild, moderate, severe)      Mild 5. RECURRENT SYMPTOM: Have you had difficulty breathing before? If Yes, ask: When was the last time? and What happened that time?      yes 6. CARDIAC HISTORY: Do you have any history of heart disease? (e.g., heart attack, angina, bypass surgery, angioplasty)      A fib 7. LUNG HISTORY: Do you have any history of lung disease?  (e.g., pulmonary embolus, asthma, emphysema)     asthma 8. CAUSE: What do you think is causing the breathing problem?      unsure 9. OTHER SYMPTOMS: Do you have any other symptoms? (e.g., chest pain, cough, dizziness, fever, runny nose)      Cough, congestion 10. O2 SATURATION MONITOR:  Do you use an oxygen saturation monitor (pulse oximeter) at home? If Yes, ask: What is your reading (oxygen level) today? What is your usual oxygen saturation reading? (e.g., 95%)       NA-patient reports not having a pulse oximeter monitor. 12. TRAVEL: Have you traveled out of the country in the last month? (e.g., travel history, exposures)       No  Patient requested to be seen today by Dr. Darlean. Explained Dr. Darlean wasn't available in [place] today but have an acute appointment available at 2:00 PM today with another provider in office. Patient accepted this appointment for today but also wanted to get an appointment made with Dr. Darlean as well. Patient was transferred back to agent to schedule an office visit.  Protocols used: Breathing Difficulty-A-AH

## 2023-10-29 NOTE — Progress Notes (Signed)
 Please let patient know CXR negative for acute process such as pneumonia, findings consistent with COPD/ scarring. Take abx for bronchitis as prescribed, COVID was negative. If not better let me know and we can add prednisone 

## 2023-10-30 NOTE — Progress Notes (Signed)
 Pt.notified

## 2023-11-03 NOTE — Progress Notes (Unsigned)
 Subjective:   Patient ID: Elizabeth Mcconnell, female    DOB: [birth year]    MRN: [id number]  Brief patient profile:  21  yowf active smoker with asthma all her life previous pt of Dr Brien and Noreen with moderate asthma criteria vs copd GOLD II as of pfts 07/01/2017       History of Present Illness  03/25/2016 acute extended ov/Elizabeth Mcconnell re:  Chronic asthma maint flovent  hfa ? Still smoking ?  Chief Complaint  Patient presents with   Acute Visit    Pt c/o chest and sinus congestion for the past 2 wks. She feels very tired and states that she has chills all the time- no fever. She has had a prod cough with grey sputum.    baseline doing fine on  flovent  hfa / singulair  not needing any rescue with little flare of cough attributes to  Duana dec 2017 took doxy and all better until 2 weeks prior to OV with cough thick grey mucus x 2 weeks worse in ams with lots of sinus congestion and grey mucus, some chills but no fever / sweats - even with flare not using xopenex  and not sure how much she can maybe 2 pffs a day  rec Doxycycline  100 mg twice daily x 10 days Prednisone  10 mg take  4 each am x 2 days,   2 each am x 2 days,  1 each am x 2 days and stop  Work on inhaler technique: For cough / congestion mucinex 1200 mg every 12 hours as needed and use the xopenex  hfa more regularly to improve the mucus flow   07/25/22  CT sinus R sphenoid dz   08/02/2022  f/u ov/Elizabeth Mcconnell re: GOLD 2/AB  maint on symb/spiriva   did not  bring meds  Chief Complaint  Patient presents with   Follow-up    Review medications.  Did take prednisone  - helped sx.  Productive cough and dyspnea persistent.  Dyspnea:  up and down steps better  Cough: better / min mucoid  Sleeping: flat bed/ 1-2 pillows s resp cc  SABA use: 1-2 x per day  02: none  New rash on 5/28 both legs/ does not itch Rec Plan A = Automatic = Always=   Symbicort  160 x 2 puffs 1st each am and again 12 hours later Spiriva  2 pffs each am only  Plan B =  Backup (to supplement plan A, not to replace it) Only use your albuterol  inhaler as a rescue medication Plan D = Deltasone  if AB  not working well  Prednisone  10 mg take  4 each am x 2 days,   2 each am x 2 days,  1 each am x 2 days and stop  For cough > mucinex  600 1-2 every 12 hours as needed with flutter valve  Please schedule a follow up office visit in 4 weeks, sooner if needed  with all medications /inhalers/ solutions in hand     01/28/2023  f/u ov/Elizabeth Mcconnell re: GOLD 2/ AB  maint on symbicort /spiriva   not needing prednisone / resumed smoking   Chief Complaint  Patient presents with   Follow-up    Doing well.  Low energy  Dyspnea:  still some problem with steps /ok flat and slow  Cough: none  Sleeping: flat bed/ one pillow s  resp cc  SABA use: occasionally  02: none  Rec Work on inhaler technique:    The key is to stop smoking completely before smoking completely stops you!  Please schedule a follow up visit in 6  months but call sooner if needed    11/04/2023  f/u ov/Elizabeth Mcconnell re: GOLD 2/ AB    maint on symbicort /spriva/singulair    recurrent  smoker just finishing doxy Chief Complaint  Patient presents with   Medical Management of Chronic Issues   Asthma    Cough is much improved.   Dyspnea:  flat and and slow/steps a problem  Cough: gone  Sleeping: flat bad one pillow no  resp cc  SABA use: rare this week p lots last week with doxy  02:none  Main concern is itchy rash over shins and forearms p applying AK lotion per derm but has not contatced derm yet - hs stopped lotion but doen't know what to take   No obvious day to day or daytime variability or assoc excess/ purulent sputum or mucus plugs or hemoptysis or cp or chest tightness, subjective wheeze or overt sinus or hb symptoms.    Also denies any obvious fluctuation of symptoms with weather or environmental changes or other aggravating or alleviating factors except as outlined above   No unusual exposure hx or h/o childhood  pna/   or knowledge of premature birth.  Current Allergies, Complete Past Medical History, Past Surgical History, Family History, and Social History were reviewed in [employer] record.  ROS  The following are not active complaints unless bolded Hoarseness, sore throat, dysphagia, dental problems, itching, sneezing,  nasal congestion or discharge of excess mucus or purulent secretions, ear ache,   fever, chills, sweats, unintended wt loss or wt gain, classically pleuritic or exertional cp,  orthopnea pnd or arm/hand swelling  or leg swelling, presyncope, palpitations, abdominal pain, anorexia, nausea, vomiting, diarrhea  or change in bowel habits or change in bladder habits, change in stools or change in urine, dysuria, hematuria,  rash, arthralgias, visual complaints, headache, numbness, weakness or ataxia or problems with walking or coordination,  change in mood or  memory.        Current Meds  Medication Sig   acetaminophen  (TYLENOL ) 325 MG tablet Take 2 tablets (650 mg total) by mouth every 6 (six) hours as needed for up to 30 doses for moderate pain or mild pain.   ALPRAZolam  (XANAX ) 0.25 MG tablet Take 1 tablet (0.25 mg total) by mouth at bedtime as needed for anxiety. TAKE 1 TABLET BY MOUTH AT BEDTIME AS NEEDED FOR ANXIETY OR SLEEP Strength: 0.25 mg   budesonide -formoterol  (SYMBICORT ) 160-4.5 MCG/ACT inhaler Inhale 2 puffs into the lungs 2 (two) times daily.   BYSTOLIC  5 MG tablet TAKE 1 TABLET BY MOUTH EVERY DAY (Patient taking differently: Take 5 mg by mouth daily.)   Calcium  Carb-Cholecalciferol 600-800 MG-UNIT TABS Take 1 tablet by mouth daily.   dofetilide  (TIKOSYN ) 250 MCG capsule TAKE 1 CAPSULE BY MOUTH 2 TIMES DAILY   doxycycline  (VIBRA -TABS) 100 MG tablet Take 1 tablet (100 mg total) by mouth 2 (two) times daily.   ELIQUIS  5 MG TABS tablet TAKE 1 TABLET TWICE DAILY   famotidine  (PEPCID ) 20 MG tablet TAKE 1 TABLET BY MOUTH AT BEDTIME AS DIRECTED   fluticasone   (FLONASE ) 50 MCG/ACT nasal spray USE 2 SPRAYS in each nostril 2 TIMES DAILY   levalbuterol  (XOPENEX  HFA) 45 MCG/ACT inhaler Inhale 1-2 puffs into the lungs every 4 (four) hours as needed for wheezing.   losartan  (COZAAR ) 100 MG tablet TAKE 1 TABLET BY MOUTH ONCE DAILY   magnesium  oxide (MAG-OX) 400 MG tablet Take 1 tablet (  400 mg total) by mouth daily.   montelukast  (SINGULAIR ) 10 MG tablet TAKE 1 TABLET BY MOUTH EVERY DAY   omeprazole  (PRILOSEC) 20 MG capsule Take one capsule 30-60 min before first meal of the day   potassium chloride  (KLOR-CON ) 10 MEQ tablet Take 2 tablets (20 mEq total) by mouth daily.   pravastatin  (PRAVACHOL ) 40 MG tablet Take 40 mg by mouth daily.   SPIRIVA  RESPIMAT 2.5 MCG/ACT AERS INHALE 2 PUFFS EVERY DAY   triamcinolone  cream (KENALOG ) 0.1 % Apply topically 2 (two) times daily.                 Objective:   Physical Exam  Wts  11/04/2023            149   01/28/2023       152  08/02/2022         150  07/15/2022         148 12/31/2021       154  09/24/2021         150  08/06/2021           148  01/16/2021       152   07/24/2020         159  10/22/2019       153 09/16/2018        151 07/01/2017        140  02/19/2017      147   03/25/16 156 lb 12.8 oz (71.1 kg)  02/12/16 155 lb 6.4 oz (70.5 kg)  11/28/15 150 lb 12 oz (68.4 kg)    Vital signs reviewed  11/04/2023  - Note at rest 02 sats  100% on RA   General appearance:    amb pleasantly anxious  wf nad   HEENT : Oropharynx  clear      NECK :  without  apparent JVD/ palpable Nodes/TM    LUNGS: no acc muscle use,  Mild barrel  contour chest wall with bilateral  Distant bs s audible wheeze and  without cough on insp or exp maneuvers  and mild  Hyperresonant  to  percussion bilaterally     CV:  RRR  no s3 or murmur or increase in P2, and no edema   ABD:  soft and nontender with pos end  insp Hoover's  in the supine position.  No bruits or organomegaly appreciated   MS:  Nl gait/ ext warm without  deformities Or obvious joint restrictions  calf tenderness, cyanosis or clubbing     SKIN: warm and dry with erythtematous scaly dry rash over volar surface of arms and extensor surfaces LE's with scratch marks  NEURO:  alert, approp, nl sensorium with  no motor or cerebellar deficits apparent.      Assessment & Plan:    Assessment & Plan Moderate persistent asthma without complication Onset all her life/ active smoker  Spirometry  09/25/15 FEV1 1.58  (70%)  Ratio 55 on flovent  110 2bid  PFT  09/25/15:  FEV1 1.58 L (70%) FEV1/FVC 0.55   73% DLCO uncorrected 58% - PFT's  07/01/2017  FEV1 1.71 (77 % ) ratio 62  p 6 % improvement from saba p only flovent  110 prior to study with DLCO  60 % corrects to 72  % for alv volume  - 06/01/20  Change to trelegy  > preferred symbicort  160 - .08/06/2021 change to breztri  2 in am and prn the pm doses due to mouth irritation -.09/24/2021  continue breztri    - 12/31/2021  After extensive coaching inhaler device,  effectiveness =    85% > continue Breztri  > preferred symb 160 - 07/15/2022  After extensive coaching inhaler device,  effectiveness =   75% SMI try adding spiriva  added prednisone  x 6 d  as plan C on action plan  -  01/28/2023  After extensive coaching inhaler device,  effectiveness =  70% hfa/ and smi   Well compensated s/p rx with doxy for aecopd s prednsione needed c/w  Group D (now reclassified as E) in terms of symptom/risk and laba/lama/ICS  therefore appropriate rx at this point >>>  continue symbicort / spriva and approp saba    Cigarette smoker Turned down for LDSCT due to limit of age 19  12/03/2022   -  Counseled re importance of smoking cessation but did not meet time criteria for separate billing     Rash Likely reaction to skin cancer cream > contact derm and in meantime try cortisone ointment 1%  and benadryl  prn         Each maintenance medication was reviewed in detail including emphasizing most importantly the difference  between maintenance and prns and under what circumstances the prns are to be triggered using an action plan format where appropriate.  Total time for H and P, chart review, counseling, reviewing hfa/ smi/ device(s) and generating customized AVS unique to this office visit / same day charting = 30 min         AVS  Patient Instructions  You can try Hydrocortisone  ointment twice daily to rash and benadryl  temporarily until you reach a dermatologist   Please schedule a follow up visit in 6  months but call sooner if needed    Ozell America, MD 11/04/2023

## 2023-11-04 ENCOUNTER — Encounter: Payer: Self-pay | Admitting: Internal Medicine

## 2023-11-04 ENCOUNTER — Ambulatory Visit: Admitting: Internal Medicine

## 2023-11-04 VITALS — BP 130/70 | HR 67 | Ht 65.0 in | Wt 149.4 lb

## 2023-11-04 DIAGNOSIS — J4540 Moderate persistent asthma, uncomplicated: Secondary | ICD-10-CM | POA: Diagnosis not present

## 2023-11-04 DIAGNOSIS — R21 Rash and other nonspecific skin eruption: Secondary | ICD-10-CM

## 2023-11-04 DIAGNOSIS — F17210 Nicotine dependence, cigarettes, uncomplicated: Secondary | ICD-10-CM

## 2023-11-04 NOTE — Assessment & Plan Note (Addendum)
 Likely reaction to skin cancer cream > contact derm and in meantime try cortisone ointment 1%  and benadryl  prn         Each maintenance medication was reviewed in detail including emphasizing most importantly the difference between maintenance and prns and under what circumstances the prns are to be triggered using an action plan format where appropriate.  Total time for H and P, chart review, counseling, reviewing hfa/ smi/ device(s) and generating customized AVS unique to this office visit / same day charting = 30 min

## 2023-11-04 NOTE — Assessment & Plan Note (Addendum)
 Onset all her life/ active smoker  Spirometry  09/25/15 FEV1 1.58  (70%)  Ratio 55 on flovent  110 2bid  PFT  09/25/15:  FEV1 1.58 L (70%) FEV1/FVC 0.55   73% DLCO uncorrected 58% - PFT's  07/01/2017  FEV1 1.71 (77 % ) ratio 62  p 6 % improvement from saba p only flovent  110 prior to study with DLCO  60 % corrects to 72  % for alv volume  - 06/01/20  Change to trelegy  > preferred symbicort  160 - .08/06/2021 change to breztri  2 in am and prn the pm doses due to mouth irritation -.09/24/2021  continue breztri    - 12/31/2021  After extensive coaching inhaler device,  effectiveness =    85% > continue Breztri  > preferred symb 160 - 07/15/2022  After extensive coaching inhaler device,  effectiveness =   75% SMI try adding spiriva  added prednisone  x 6 d  as plan C on action plan  -  01/28/2023  After extensive coaching inhaler device,  effectiveness =  70% hfa/ and smi   Well compensated s/p rx with doxy for aecopd s prednsione needed c/w  Group D (now reclassified as E) in terms of symptom/risk and laba/lama/ICS  therefore appropriate rx at this point >>>  continue symbicort / spriva and approp saba

## 2023-11-04 NOTE — Patient Instructions (Signed)
 You can try Hydrocortisone  ointment twice daily to rash and benadryl  temporarily until you reach a dermatologist   Please schedule a follow up visit in 6  months but call sooner if needed

## 2023-11-04 NOTE — Assessment & Plan Note (Addendum)
 Turned down for LDSCT due to limit of age 78  12/03/2022   -  Counseled re importance of smoking cessation but did not meet time criteria for separate billing

## 2023-11-06 DIAGNOSIS — L2989 Other pruritus: Secondary | ICD-10-CM | POA: Diagnosis not present

## 2023-11-06 DIAGNOSIS — L249 Irritant contact dermatitis, unspecified cause: Secondary | ICD-10-CM | POA: Diagnosis not present

## 2023-11-17 NOTE — Progress Notes (Unsigned)
  Electrophysiology Office Note:   Date:  11/18/2023  ID:  Elizabeth Mcconnell, DOB May 15, 1945, MRN [id number]  Primary Cardiologist: None Primary Heart Failure: None Electrophysiologist: Montasia Chisenhall Gladis Norton, MD      History of Present Illness:   Elizabeth Mcconnell is a 78 y.o. female with h/o atrial fibrillation/flutter, hypertension seen today for routine electrophysiology followup.   Since last being seen in our clinic the patient reports doing well.  She has had no episodes of atrial fibrillation.  She is happy with her control.  She has recently been going through some anxiety as she has recently moved.  Aside from that, she has no complaints.  she denies chest pain, palpitations, dyspnea, PND, orthopnea, nausea, vomiting, dizziness, syncope, edema, weight gain, or early satiety.   Review of systems complete and found to be negative unless listed in HPI.   EP Information / Studies Reviewed:    EKG is ordered today. Personal review as below.  EKG Interpretation Date/Time:  Tuesday November 18 2023 09:44:32 EDT Ventricular Rate:  70 PR Interval:  192 QRS Duration:  84 QT Interval:  426 QTC Calculation: 460 R Axis:   39  Text Interpretation: Sinus rhythm with Premature atrial complexes with Abberant conduction When compared with ECG of 31-Mar-2023 14:04, No significant change since last tracing Confirmed by Khristian Phillippi ([id number]) on 11/18/2023 9:46:16 AM   Risk Assessment/Calculations:    CHA2DS2-VASc Score = 4   This indicates a 4.8% annual risk of stroke. The patient's score is based upon: CHF History: 0 HTN History: 1 Diabetes History: 0 Stroke History: 0 Vascular Disease History: 0 Age Score: 2 Gender Score: 1            Physical Exam:   VS:  BP (!) 143/78 (BP Location: Right Arm, Patient Position: Sitting, Cuff Size: Normal)   Pulse 70   Ht 5' 5 (1.651 m)   Wt 143 lb (64.9 kg)   SpO2 98%   BMI 23.80 kg/m    Wt Readings from Last 3 Encounters:  11/18/23  143 lb (64.9 kg)  11/04/23 149 lb 6.4 oz (67.8 kg)  10/29/23 148 lb 6.4 oz (67.3 kg)     GEN: Well nourished, well developed in no acute distress NECK: No JVD; No carotid bruits CARDIAC: Regular rate and rhythm, no murmurs, rubs, gallops RESPIRATORY:  Clear to auscultation without rales, wheezing or rhonchi  ABDOMEN: Soft, non-tender, non-distended EXTREMITIES:  No edema; No deformity   ASSESSMENT AND PLAN:    1.  Paroxysmal atrial fibrillation/flutter: On dofetilide  and Bystolic .  Post ablation 11/05/2016.  She remains in sinus rhythm.  She has had no further episodes of atrial fibrillation.  Elizabeth Mcconnell continue with current management.  2.  Secondary hypercoagulable state: On Eliquis   3.  High-risk medication monitoring: On dofetilide .  QTc remained stable.  She had a BMP checked with her primary physician recently.  Elizabeth Mcconnell call his office and have that faxed to us .  Elizabeth Mcconnell check a magnesium  today.  4.  Hypertension: Mildly elevated but usually well-controlled.  Plan per primary care.  Follow up with Afib Clinic in 6 months  Signed, Vahan Wadsworth Gladis Norton, MD

## 2023-11-18 ENCOUNTER — Ambulatory Visit: Attending: Cardiology | Admitting: Cardiology

## 2023-11-18 ENCOUNTER — Encounter: Payer: Self-pay | Admitting: Cardiology

## 2023-11-18 VITALS — BP 143/78 | HR 70 | Ht 65.0 in | Wt 143.0 lb

## 2023-11-18 DIAGNOSIS — Z79899 Other long term (current) drug therapy: Secondary | ICD-10-CM | POA: Diagnosis not present

## 2023-11-18 DIAGNOSIS — I10 Essential (primary) hypertension: Secondary | ICD-10-CM

## 2023-11-18 DIAGNOSIS — I480 Paroxysmal atrial fibrillation: Secondary | ICD-10-CM

## 2023-11-18 DIAGNOSIS — Z5181 Encounter for therapeutic drug level monitoring: Secondary | ICD-10-CM | POA: Diagnosis not present

## 2023-11-18 DIAGNOSIS — D6869 Other thrombophilia: Secondary | ICD-10-CM

## 2023-11-18 NOTE — Patient Instructions (Signed)
 Medication Instructions:  Your physician recommends that you continue on your current medications as directed. Please refer to the Current Medication list given to you today.  *If you need a refill on your cardiac medications before your next appointment, please call your pharmacy*  Lab Work: Today:  Magnesium  level  If you have any lab test that is abnormal or we need to change your treatment, we will call you to review the results.  Testing/Procedures: None ordered  Follow-Up: At [facility], you and your health needs are our priority.  As part of our continuing mission to provide you with exceptional heart care, our providers are all part of one team.  This team includes your primary Cardiologist (physician) and Advanced Practice Providers or APPs (Physician Assistants and Nurse Practitioners) who all work together to provide you with the care you need, when you need it.  Your next appointment:   6 month(s)  Provider:   You will follow up in the Atrial Fibrillation Clinic located at [facility]. Your provider will be: Clint R. Fenton, PA-C or Fairy Heinrich, PA-C     Thank you for choosing [employer]!!   Maeola Domino, RN [phone]

## 2023-11-19 LAB — MAGNESIUM: Magnesium: 2.3 mg/dL (ref 1.6–2.3)

## 2023-12-11 DIAGNOSIS — L570 Actinic keratosis: Secondary | ICD-10-CM | POA: Diagnosis not present

## 2023-12-11 DIAGNOSIS — L565 Disseminated superficial actinic porokeratosis (DSAP): Secondary | ICD-10-CM | POA: Diagnosis not present

## 2023-12-11 DIAGNOSIS — D0461 Carcinoma in situ of skin of right upper limb, including shoulder: Secondary | ICD-10-CM | POA: Diagnosis not present

## 2023-12-11 DIAGNOSIS — D492 Neoplasm of unspecified behavior of bone, soft tissue, and skin: Secondary | ICD-10-CM | POA: Diagnosis not present

## 2023-12-11 DIAGNOSIS — R233 Spontaneous ecchymoses: Secondary | ICD-10-CM | POA: Diagnosis not present

## 2023-12-11 DIAGNOSIS — D0471 Carcinoma in situ of skin of right lower limb, including hip: Secondary | ICD-10-CM | POA: Diagnosis not present

## 2024-01-12 DIAGNOSIS — J4520 Mild intermittent asthma, uncomplicated: Secondary | ICD-10-CM | POA: Diagnosis not present

## 2024-01-12 DIAGNOSIS — E0590 Thyrotoxicosis, unspecified without thyrotoxic crisis or storm: Secondary | ICD-10-CM | POA: Diagnosis not present

## 2024-01-12 DIAGNOSIS — F419 Anxiety disorder, unspecified: Secondary | ICD-10-CM | POA: Diagnosis not present

## 2024-01-12 DIAGNOSIS — F331 Major depressive disorder, recurrent, moderate: Secondary | ICD-10-CM | POA: Diagnosis not present

## 2024-01-12 DIAGNOSIS — I700 Atherosclerosis of aorta: Secondary | ICD-10-CM | POA: Diagnosis not present

## 2024-01-12 DIAGNOSIS — N1831 Chronic kidney disease, stage 3a: Secondary | ICD-10-CM | POA: Diagnosis not present

## 2024-01-12 DIAGNOSIS — I480 Paroxysmal atrial fibrillation: Secondary | ICD-10-CM | POA: Diagnosis not present

## 2024-01-12 DIAGNOSIS — E785 Hyperlipidemia, unspecified: Secondary | ICD-10-CM | POA: Diagnosis not present

## 2024-01-19 ENCOUNTER — Other Ambulatory Visit: Payer: Self-pay | Admitting: Pulmonary Disease

## 2024-01-19 DIAGNOSIS — L570 Actinic keratosis: Secondary | ICD-10-CM | POA: Diagnosis not present

## 2024-01-19 DIAGNOSIS — D485 Neoplasm of uncertain behavior of skin: Secondary | ICD-10-CM | POA: Diagnosis not present

## 2024-01-19 DIAGNOSIS — L853 Xerosis cutis: Secondary | ICD-10-CM | POA: Diagnosis not present

## 2024-01-27 DIAGNOSIS — Z1231 Encounter for screening mammogram for malignant neoplasm of breast: Secondary | ICD-10-CM | POA: Diagnosis not present

## 2024-02-04 ENCOUNTER — Other Ambulatory Visit (HOSPITAL_COMMUNITY): Payer: Self-pay | Admitting: Physician Assistant

## 2024-03-17 ENCOUNTER — Other Ambulatory Visit: Payer: Self-pay | Admitting: Internal Medicine

## 2024-03-17 NOTE — Telephone Encounter (Signed)
 Pt requesting refill of Levalbuterol , not mentioned in LOV to continue or d/c. Please advise, thank you!

## 2024-03-30 ENCOUNTER — Ambulatory Visit (HOSPITAL_COMMUNITY): Admitting: Physician Assistant

## 2024-04-07 ENCOUNTER — Ambulatory Visit (HOSPITAL_COMMUNITY)
Admission: RE | Admit: 2024-04-07 | Discharge: 2024-04-07 | Disposition: A | Source: Ambulatory Visit | Attending: Physician Assistant | Admitting: Physician Assistant

## 2024-04-07 VITALS — BP 138/70 | HR 71 | Ht 65.0 in | Wt 151.7 lb

## 2024-04-07 DIAGNOSIS — D6869 Other thrombophilia: Secondary | ICD-10-CM

## 2024-04-07 DIAGNOSIS — Z5181 Encounter for therapeutic drug level monitoring: Secondary | ICD-10-CM

## 2024-04-07 DIAGNOSIS — Z79899 Other long term (current) drug therapy: Secondary | ICD-10-CM

## 2024-04-07 DIAGNOSIS — I4891 Unspecified atrial fibrillation: Secondary | ICD-10-CM | POA: Diagnosis not present

## 2024-04-07 DIAGNOSIS — I480 Paroxysmal atrial fibrillation: Secondary | ICD-10-CM

## 2024-04-07 NOTE — Progress Notes (Signed)
 "   Primary Care Physician: Nichole Senior, MD Primary Cardiologist: None Electrophysiologist: Will Gladis Norton, MD  Referring Physician: Dr Kelsie Veto Deep is a 79 y.o. female with a history of HTN, atrial flutter, atrial fibrillation who presents for follow up in the [facility].  She is status post atrial fibrillation ablation 11/05/2016 and has been maintained on dofetilide . Patient is on Eliquis  for stroke prevention.   Patient returns for follow up for atrial fibrillation and dofetilide  monitoring. Discussed the use of AI scribe software for clinical note transcription with the patient, who gave verbal consent to proceed.  She reports no episodes of atrial fibrillation since her last visit. She remains in SR today and feels well. She denies significant bleeding issues on anticoagulation. She does have some bruising on her hands and legs with minor impacts.      Today, she  denies symptoms of palpitations, chest pain, shortness of breath, orthopnea, PND, lower extremity edema, dizziness, presyncope, syncope, snoring, daytime somnolence, bleeding, or neurologic sequela. The patient is tolerating medications without difficulties and is otherwise without complaint today.    Atrial Fibrillation Risk Factors:  she does not have symptoms or diagnosis of sleep apnea. she does not have a history of rheumatic fever.   Atrial Fibrillation Management history:  Previous antiarrhythmic drugs: dofetilide   Previous cardioversions: 11/27/12 Previous ablations: flutter 2014, afib 2018 Anticoagulation history: Eliquis   ROS- All systems are reviewed and negative except as per the HPI above.   Physical Exam: BP 138/70   Pulse 71   Ht 5' 5 (1.651 m)   Wt 68.8 kg   BMI 25.24 kg/m   GEN: Well nourished, well developed in no acute distress CARDIAC: Regular rate and rhythm, no murmurs, rubs, gallops RESPIRATORY:  Clear to auscultation without rales,  wheezing or rhonchi  ABDOMEN: Soft, non-tender, non-distended EXTREMITIES:  No edema; No deformity    Wt Readings from Last 3 Encounters:  04/07/24 68.8 kg  11/18/23 64.9 kg  11/04/23 67.8 kg     EKG Interpretation Date/Time:  Wednesday April 07 2024 09:35:30 EST Ventricular Rate:  71 PR Interval:  206 QRS Duration:  92 QT Interval:  420 QTC Calculation: 456 R Axis:   -18  Text Interpretation: Normal sinus rhythm with 1st degree A-V block Low voltage QRS Possible Inferior infarct , age undetermined Cannot rule out Anterior infarct , age undetermined Abnormal ECG When compared with ECG of 18-Nov-2023 09:44, No significant change was found Confirmed by Ladelle Teodoro ([id number]) on 04/07/2024 9:47:54 AM    Echo 02/15/15 demonstrated  - Left ventricle: The cavity size was normal. Wall thickness was    normal. Systolic function was normal. The estimated ejection    fraction was in the range of 60% to 65%. Wall motion was normal;    there were no regional wall motion abnormalities. Left    ventricular diastolic function parameters were normal.  - Mitral valve: There was mild regurgitation.  - Atrial septum: No defect or patent foramen ovale was identified.    CHA2DS2-VASc Score = 4  The patient's score is based upon: CHF History: 0 HTN History: 1 Diabetes History: 0 Stroke History: 0 Vascular Disease History: 0 Age Score: 2 Gender Score: 1       ASSESSMENT AND PLAN: Paroxysmal Atrial Fibrillation (ICD10:  I48.0) The patient's CHA2DS2-VASc score is 4, indicating a 4.8% annual risk of stroke.   S/p flutter ablation 2014 and afib ablation 2018 Patient appears to be maintaining  SR Continue dofetilide  250 mcg BID Continue Eliquis  5 mg BID  Secondary Hypercoagulable State (ICD10:  D68.69) The patient is at significant risk for stroke/thromboembolism based upon her CHA2DS2-VASc Score of 4.  Continue Apixaban  (Eliquis ). No bleeding issues.   High Risk Medication Monitoring (ICD  10: J342684) Patient requires ongoing monitoring for anti-arrhythmic medication which has the potential to cause life threatening arrhythmias. QT interval on ECG acceptable for dofetilide  monitoring. Check bmet/mag today.     HTN Stable on current regimen   Follow up in the AF clinic in 6 months.    Daril Kicks PA-C [facility] [address] [place], [place] [address] [phone] "

## 2024-04-08 ENCOUNTER — Ambulatory Visit (HOSPITAL_COMMUNITY): Payer: Self-pay | Admitting: Physician Assistant

## 2024-04-08 LAB — MAGNESIUM: Magnesium: 2.1 mg/dL (ref 1.6–2.3)

## 2024-04-08 LAB — BASIC METABOLIC PANEL WITH GFR
BUN/Creatinine Ratio: 10 — ABNORMAL LOW (ref 12–28)
BUN: 9 mg/dL (ref 8–27)
CO2: 21 mmol/L (ref 20–29)
Calcium: 9.8 mg/dL (ref 8.7–10.3)
Chloride: 102 mmol/L (ref 96–106)
Creatinine, Ser: 0.86 mg/dL (ref 0.57–1.00)
Glucose: 80 mg/dL (ref 70–99)
Potassium: 4.9 mmol/L (ref 3.5–5.2)
Sodium: 139 mmol/L (ref 134–144)
eGFR: 68 mL/min/{1.73_m2}

## 2024-05-17 ENCOUNTER — Ambulatory Visit (HOSPITAL_COMMUNITY): Admitting: Physician Assistant

## 2024-05-25 ENCOUNTER — Ambulatory Visit: Admitting: Primary Care

## 2024-10-07 ENCOUNTER — Ambulatory Visit (HOSPITAL_COMMUNITY): Admitting: Physician Assistant
